# Patient Record
Sex: Female | Born: 1945 | Race: Black or African American | Hispanic: No | Marital: Married | Smoking: Former smoker
Health system: Southern US, Community
[De-identification: ages and names within clinical notes are randomized; demographics above are authoritative.]

## PROBLEM LIST (undated history)

## (undated) DIAGNOSIS — I509 Heart failure, unspecified: Secondary | ICD-10-CM

## (undated) DIAGNOSIS — K219 Gastro-esophageal reflux disease without esophagitis: Secondary | ICD-10-CM

## (undated) DIAGNOSIS — J189 Pneumonia, unspecified organism: Secondary | ICD-10-CM

## (undated) DIAGNOSIS — Z8489 Family history of other specified conditions: Secondary | ICD-10-CM

## (undated) DIAGNOSIS — E7800 Pure hypercholesterolemia, unspecified: Secondary | ICD-10-CM

## (undated) DIAGNOSIS — M1990 Unspecified osteoarthritis, unspecified site: Secondary | ICD-10-CM

## (undated) DIAGNOSIS — R112 Nausea with vomiting, unspecified: Secondary | ICD-10-CM

## (undated) DIAGNOSIS — J40 Bronchitis, not specified as acute or chronic: Secondary | ICD-10-CM

## (undated) DIAGNOSIS — D649 Anemia, unspecified: Secondary | ICD-10-CM

## (undated) DIAGNOSIS — E079 Disorder of thyroid, unspecified: Secondary | ICD-10-CM

## (undated) DIAGNOSIS — Z9889 Other specified postprocedural states: Secondary | ICD-10-CM

## (undated) DIAGNOSIS — E039 Hypothyroidism, unspecified: Secondary | ICD-10-CM

## (undated) DIAGNOSIS — I10 Essential (primary) hypertension: Secondary | ICD-10-CM

## (undated) HISTORY — PX: BREAST SURGERY: SHX581

## (undated) HISTORY — PX: OTHER SURGICAL HISTORY: SHX169

## (undated) HISTORY — PX: ABDOMINAL HYSTERECTOMY: SHX81

## (undated) HISTORY — PX: KNEE ARTHROPLASTY: SHX992

## (undated) HISTORY — PX: TONSILLECTOMY: SUR1361

## (undated) HISTORY — DX: Essential (primary) hypertension: I10

## (undated) HISTORY — PX: FOOT SURGERY: SHX648

## (undated) HISTORY — DX: Disorder of thyroid, unspecified: E07.9

## (undated) HISTORY — PX: COLONOSCOPY W/ POLYPECTOMY: SHX1380

---

## 1998-08-25 ENCOUNTER — Encounter: Admission: RE | Admit: 1998-08-25 | Discharge: 1998-11-23 | Payer: Self-pay | Admitting: *Deleted

## 1998-10-11 ENCOUNTER — Other Ambulatory Visit: Admission: RE | Admit: 1998-10-11 | Discharge: 1998-10-11 | Payer: Self-pay | Admitting: Obstetrics and Gynecology

## 2000-07-22 ENCOUNTER — Encounter: Payer: Self-pay | Admitting: Orthopedic Surgery

## 2000-07-22 ENCOUNTER — Ambulatory Visit (HOSPITAL_COMMUNITY): Admission: RE | Admit: 2000-07-22 | Discharge: 2000-07-22 | Payer: Self-pay | Admitting: Orthopedic Surgery

## 2000-10-23 ENCOUNTER — Other Ambulatory Visit: Admission: RE | Admit: 2000-10-23 | Discharge: 2000-10-23 | Payer: Self-pay | Admitting: Obstetrics and Gynecology

## 2001-11-11 ENCOUNTER — Other Ambulatory Visit: Admission: RE | Admit: 2001-11-11 | Discharge: 2001-11-11 | Payer: Self-pay | Admitting: Obstetrics and Gynecology

## 2002-11-30 ENCOUNTER — Encounter: Admission: RE | Admit: 2002-11-30 | Discharge: 2002-11-30 | Payer: Self-pay | Admitting: Internal Medicine

## 2002-11-30 ENCOUNTER — Encounter: Payer: Self-pay | Admitting: Internal Medicine

## 2002-12-02 ENCOUNTER — Other Ambulatory Visit: Admission: RE | Admit: 2002-12-02 | Discharge: 2002-12-02 | Payer: Self-pay | Admitting: Obstetrics and Gynecology

## 2003-12-09 ENCOUNTER — Other Ambulatory Visit: Admission: RE | Admit: 2003-12-09 | Discharge: 2003-12-09 | Payer: Self-pay | Admitting: Obstetrics and Gynecology

## 2004-01-31 ENCOUNTER — Ambulatory Visit: Payer: Self-pay | Admitting: Internal Medicine

## 2004-02-20 ENCOUNTER — Ambulatory Visit: Payer: Self-pay | Admitting: Internal Medicine

## 2004-03-27 ENCOUNTER — Ambulatory Visit: Payer: Self-pay | Admitting: Family Medicine

## 2004-05-01 ENCOUNTER — Ambulatory Visit: Payer: Self-pay | Admitting: Internal Medicine

## 2004-05-08 ENCOUNTER — Ambulatory Visit: Payer: Self-pay | Admitting: Internal Medicine

## 2004-05-30 ENCOUNTER — Ambulatory Visit: Payer: Self-pay | Admitting: Internal Medicine

## 2004-06-28 ENCOUNTER — Ambulatory Visit: Payer: Self-pay | Admitting: Internal Medicine

## 2004-07-05 ENCOUNTER — Ambulatory Visit: Payer: Self-pay | Admitting: Internal Medicine

## 2004-07-31 ENCOUNTER — Ambulatory Visit: Payer: Self-pay | Admitting: Internal Medicine

## 2004-08-20 ENCOUNTER — Ambulatory Visit: Payer: Self-pay | Admitting: Family Medicine

## 2004-10-08 ENCOUNTER — Ambulatory Visit: Payer: Self-pay | Admitting: Internal Medicine

## 2004-10-25 ENCOUNTER — Ambulatory Visit: Payer: Self-pay | Admitting: Internal Medicine

## 2004-11-19 ENCOUNTER — Ambulatory Visit: Payer: Self-pay | Admitting: Internal Medicine

## 2005-01-02 ENCOUNTER — Other Ambulatory Visit: Admission: RE | Admit: 2005-01-02 | Discharge: 2005-01-02 | Payer: Self-pay | Admitting: Obstetrics and Gynecology

## 2005-01-21 ENCOUNTER — Ambulatory Visit: Payer: Self-pay | Admitting: Internal Medicine

## 2005-04-15 ENCOUNTER — Ambulatory Visit: Payer: Self-pay | Admitting: Internal Medicine

## 2005-04-23 ENCOUNTER — Ambulatory Visit: Payer: Self-pay | Admitting: Internal Medicine

## 2005-07-15 ENCOUNTER — Ambulatory Visit: Payer: Self-pay | Admitting: Internal Medicine

## 2005-07-22 ENCOUNTER — Ambulatory Visit: Payer: Self-pay | Admitting: Internal Medicine

## 2005-09-09 ENCOUNTER — Ambulatory Visit: Payer: Self-pay | Admitting: Internal Medicine

## 2005-09-11 ENCOUNTER — Encounter: Admission: RE | Admit: 2005-09-11 | Discharge: 2005-09-11 | Payer: Self-pay | Admitting: Internal Medicine

## 2005-11-13 ENCOUNTER — Ambulatory Visit: Payer: Self-pay | Admitting: Internal Medicine

## 2005-11-26 ENCOUNTER — Ambulatory Visit: Payer: Self-pay | Admitting: Internal Medicine

## 2006-02-18 ENCOUNTER — Ambulatory Visit: Payer: Self-pay | Admitting: Internal Medicine

## 2006-02-25 ENCOUNTER — Ambulatory Visit: Payer: Self-pay | Admitting: Internal Medicine

## 2006-03-09 ENCOUNTER — Encounter: Admission: RE | Admit: 2006-03-09 | Discharge: 2006-03-09 | Payer: Self-pay | Admitting: Orthopedic Surgery

## 2006-03-20 ENCOUNTER — Ambulatory Visit (HOSPITAL_BASED_OUTPATIENT_CLINIC_OR_DEPARTMENT_OTHER): Admission: RE | Admit: 2006-03-20 | Discharge: 2006-03-20 | Payer: Self-pay | Admitting: Orthopedic Surgery

## 2006-04-28 ENCOUNTER — Ambulatory Visit: Payer: Self-pay | Admitting: Internal Medicine

## 2006-04-28 LAB — CONVERTED CEMR LAB
BUN: 7 mg/dL (ref 6–23)
Hgb A1c MFr Bld: 8.3 % — ABNORMAL HIGH (ref 4.6–6.0)
TSH: 0.9 microintl units/mL (ref 0.35–5.50)

## 2006-05-15 ENCOUNTER — Ambulatory Visit: Payer: Self-pay | Admitting: Internal Medicine

## 2006-07-04 ENCOUNTER — Ambulatory Visit: Payer: Self-pay | Admitting: Internal Medicine

## 2006-07-16 ENCOUNTER — Ambulatory Visit: Payer: Self-pay | Admitting: Internal Medicine

## 2006-08-05 ENCOUNTER — Ambulatory Visit: Payer: Self-pay | Admitting: Internal Medicine

## 2006-08-05 LAB — CONVERTED CEMR LAB
ALT: 30 units/L (ref 0–40)
AST: 21 units/L (ref 0–37)
Albumin: 3.4 g/dL — ABNORMAL LOW (ref 3.5–5.2)
Alkaline Phosphatase: 66 units/L (ref 39–117)
BUN: 12 mg/dL (ref 6–23)
Calcium: 9.8 mg/dL (ref 8.4–10.5)
Chloride: 106 meq/L (ref 96–112)
Direct LDL: 140.5 mg/dL
GFR calc non Af Amer: 109 mL/min
Hgb A1c MFr Bld: 8 % — ABNORMAL HIGH (ref 4.6–6.0)
Total CHOL/HDL Ratio: 3.3
VLDL: 20 mg/dL (ref 0–40)

## 2006-09-24 DIAGNOSIS — I10 Essential (primary) hypertension: Secondary | ICD-10-CM | POA: Insufficient documentation

## 2006-09-24 DIAGNOSIS — E039 Hypothyroidism, unspecified: Secondary | ICD-10-CM | POA: Insufficient documentation

## 2006-11-14 ENCOUNTER — Ambulatory Visit: Payer: Self-pay | Admitting: Internal Medicine

## 2006-11-14 DIAGNOSIS — E118 Type 2 diabetes mellitus with unspecified complications: Secondary | ICD-10-CM | POA: Insufficient documentation

## 2006-11-14 LAB — CONVERTED CEMR LAB
BUN: 7 mg/dL (ref 6–23)
Calcium: 9.5 mg/dL (ref 8.4–10.5)
GFR calc Af Amer: 162 mL/min
GFR calc non Af Amer: 134 mL/min
Microalb Creat Ratio: 788.4 mg/g — ABNORMAL HIGH (ref 0.0–30.0)
Microalb, Ur: 80.5 mg/dL — ABNORMAL HIGH (ref 0.0–1.9)
Potassium: 3.4 meq/L — ABNORMAL LOW (ref 3.5–5.1)

## 2006-12-03 ENCOUNTER — Telehealth: Payer: Self-pay | Admitting: Internal Medicine

## 2006-12-04 ENCOUNTER — Ambulatory Visit: Payer: Self-pay | Admitting: Gastroenterology

## 2007-01-08 ENCOUNTER — Encounter: Payer: Self-pay | Admitting: Internal Medicine

## 2007-01-19 ENCOUNTER — Encounter: Payer: Self-pay | Admitting: Internal Medicine

## 2007-04-09 ENCOUNTER — Ambulatory Visit: Payer: Self-pay | Admitting: Internal Medicine

## 2007-04-09 LAB — CONVERTED CEMR LAB
CO2: 28 meq/L (ref 19–32)
Calcium: 9.2 mg/dL (ref 8.4–10.5)
Creatinine, Ser: 0.7 mg/dL (ref 0.4–1.2)
GFR calc Af Amer: 110 mL/min
Glucose, Bld: 239 mg/dL — ABNORMAL HIGH (ref 70–99)
Microalb Creat Ratio: 710.3 mg/g — ABNORMAL HIGH (ref 0.0–30.0)
Potassium: 4.1 meq/L (ref 3.5–5.1)

## 2007-04-30 ENCOUNTER — Telehealth: Payer: Self-pay | Admitting: Internal Medicine

## 2007-06-26 ENCOUNTER — Ambulatory Visit: Payer: Self-pay | Admitting: Internal Medicine

## 2007-06-26 LAB — CONVERTED CEMR LAB
BUN: 9 mg/dL (ref 6–23)
Chloride: 102 meq/L (ref 96–112)
GFR calc Af Amer: 162 mL/min
GFR calc non Af Amer: 134 mL/min
Hgb A1c MFr Bld: 8.3 % — ABNORMAL HIGH (ref 4.6–6.0)
Potassium: 3.6 meq/L (ref 3.5–5.1)
Sodium: 142 meq/L (ref 135–145)
TSH: 0.06 microintl units/mL — ABNORMAL LOW (ref 0.35–5.50)

## 2007-09-29 ENCOUNTER — Ambulatory Visit: Payer: Self-pay | Admitting: Internal Medicine

## 2007-10-09 ENCOUNTER — Encounter: Admission: RE | Admit: 2007-10-09 | Discharge: 2007-10-09 | Payer: Self-pay | Admitting: Obstetrics and Gynecology

## 2007-10-09 ENCOUNTER — Encounter: Payer: Self-pay | Admitting: Internal Medicine

## 2007-11-27 ENCOUNTER — Ambulatory Visit: Payer: Self-pay | Admitting: Internal Medicine

## 2007-11-27 LAB — CONVERTED CEMR LAB
Creatinine, Urine: 32.8 mg/dL
Free T4: 1.36 ng/dL (ref 0.89–1.80)
Microalb, Ur: 18.1 mg/dL — ABNORMAL HIGH (ref 0.00–1.89)

## 2007-12-09 ENCOUNTER — Telehealth: Payer: Self-pay | Admitting: Internal Medicine

## 2008-02-16 ENCOUNTER — Ambulatory Visit: Payer: Self-pay | Admitting: Internal Medicine

## 2008-02-16 DIAGNOSIS — K3184 Gastroparesis: Secondary | ICD-10-CM | POA: Insufficient documentation

## 2008-02-16 LAB — CONVERTED CEMR LAB
BUN: 9 mg/dL (ref 6–23)
CO2: 33 meq/L — ABNORMAL HIGH (ref 19–32)
Chloride: 102 meq/L (ref 96–112)
Glucose, Bld: 199 mg/dL — ABNORMAL HIGH (ref 70–99)
Hgb A1c MFr Bld: 8.6 % — ABNORMAL HIGH (ref 4.6–6.0)
Microalb Creat Ratio: 333.3 mg/g — ABNORMAL HIGH (ref 0.0–30.0)
Microalb, Ur: 16.3 mg/dL — ABNORMAL HIGH (ref 0.0–1.9)
Potassium: 3.8 meq/L (ref 3.5–5.1)
Sodium: 142 meq/L (ref 135–145)
TSH: 1.12 microintl units/mL (ref 0.35–5.50)

## 2008-03-01 LAB — HM DIABETES EYE EXAM

## 2008-05-18 ENCOUNTER — Ambulatory Visit: Payer: Self-pay | Admitting: Internal Medicine

## 2008-06-29 ENCOUNTER — Ambulatory Visit: Payer: Self-pay | Admitting: Internal Medicine

## 2008-08-31 ENCOUNTER — Ambulatory Visit: Payer: Self-pay | Admitting: Internal Medicine

## 2008-08-31 LAB — CONVERTED CEMR LAB
Calcium: 9.3 mg/dL (ref 8.4–10.5)
GFR calc non Af Amer: 130.7 mL/min (ref >60)
Potassium: 3 meq/L — ABNORMAL LOW (ref 3.5–5.1)
Sodium: 143 meq/L (ref 135–145)
TSH: 0.23 microintl units/mL — ABNORMAL LOW (ref 0.35–5.50)

## 2008-09-27 ENCOUNTER — Encounter: Payer: Self-pay | Admitting: Internal Medicine

## 2009-01-11 ENCOUNTER — Telehealth: Payer: Self-pay | Admitting: Internal Medicine

## 2009-01-12 ENCOUNTER — Encounter: Payer: Self-pay | Admitting: Internal Medicine

## 2009-01-12 ENCOUNTER — Ambulatory Visit: Payer: Self-pay | Admitting: Family Medicine

## 2009-01-12 DIAGNOSIS — Z862 Personal history of diseases of the blood and blood-forming organs and certain disorders involving the immune mechanism: Secondary | ICD-10-CM | POA: Insufficient documentation

## 2009-01-12 DIAGNOSIS — M791 Myalgia, unspecified site: Secondary | ICD-10-CM | POA: Insufficient documentation

## 2009-01-12 DIAGNOSIS — Z8639 Personal history of other endocrine, nutritional and metabolic disease: Secondary | ICD-10-CM

## 2009-01-16 LAB — CONVERTED CEMR LAB
Basophils Relative: 0.8 % (ref 0.0–3.0)
CO2: 30 meq/L (ref 19–32)
Calcium: 9.7 mg/dL (ref 8.4–10.5)
Creatinine, Ser: 0.6 mg/dL (ref 0.4–1.2)
GFR calc non Af Amer: 130.54 mL/min (ref >60)
Hemoglobin: 12.8 g/dL (ref 12.0–15.0)
Lymphocytes Relative: 38.8 % (ref 12.0–46.0)
Lymphs Abs: 4.6 10*3/uL — ABNORMAL HIGH (ref 0.7–4.0)
MCHC: 33.2 g/dL (ref 30.0–36.0)
Monocytes Absolute: 1.1 10*3/uL — ABNORMAL HIGH (ref 0.1–1.0)
Monocytes Relative: 8.9 % (ref 3.0–12.0)
Neutrophils Relative %: 48.9 % (ref 43.0–77.0)
RBC: 4.6 M/uL (ref 3.87–5.11)
RDW: 13.3 % (ref 11.5–14.6)
Sodium: 139 meq/L (ref 135–145)
Total CK: 140 units/L (ref 7–177)
Vit D, 25-Hydroxy: 22 ng/mL — ABNORMAL LOW (ref 30–89)

## 2009-03-14 ENCOUNTER — Ambulatory Visit: Payer: Self-pay | Admitting: Internal Medicine

## 2009-03-14 DIAGNOSIS — M810 Age-related osteoporosis without current pathological fracture: Secondary | ICD-10-CM | POA: Insufficient documentation

## 2009-03-14 DIAGNOSIS — M8580 Other specified disorders of bone density and structure, unspecified site: Secondary | ICD-10-CM | POA: Insufficient documentation

## 2009-03-14 LAB — CONVERTED CEMR LAB
BUN: 10 mg/dL (ref 6–23)
CO2: 30 meq/L (ref 19–32)
Chloride: 100 meq/L (ref 96–112)
Creatinine, Ser: 0.6 mg/dL (ref 0.4–1.2)
Glucose, Bld: 111 mg/dL — ABNORMAL HIGH (ref 70–99)
TSH: 0.41 microintl units/mL (ref 0.35–5.50)

## 2009-04-06 ENCOUNTER — Encounter: Payer: Self-pay | Admitting: Internal Medicine

## 2009-04-12 ENCOUNTER — Encounter: Admission: RE | Admit: 2009-04-12 | Discharge: 2009-04-12 | Payer: Self-pay | Admitting: Emergency Medicine

## 2009-06-15 ENCOUNTER — Ambulatory Visit (HOSPITAL_COMMUNITY): Admission: RE | Admit: 2009-06-15 | Discharge: 2009-06-16 | Payer: Self-pay | Admitting: Obstetrics and Gynecology

## 2009-07-28 ENCOUNTER — Ambulatory Visit: Payer: Self-pay | Admitting: Internal Medicine

## 2009-07-28 DIAGNOSIS — E1169 Type 2 diabetes mellitus with other specified complication: Secondary | ICD-10-CM | POA: Insufficient documentation

## 2009-07-28 DIAGNOSIS — E785 Hyperlipidemia, unspecified: Secondary | ICD-10-CM

## 2009-07-28 LAB — CONVERTED CEMR LAB
Calcium: 9.8 mg/dL (ref 8.4–10.5)
Cholesterol: 214 mg/dL — ABNORMAL HIGH (ref 0–200)
Creatinine, Ser: 0.6 mg/dL (ref 0.4–1.2)
Direct LDL: 142.4 mg/dL
HDL goal, serum: 40 mg/dL
HDL: 56.2 mg/dL (ref >39.00)
Hgb A1c MFr Bld: 8.4 % — ABNORMAL HIGH (ref 4.6–6.5)

## 2009-08-17 LAB — HM DIABETES FOOT EXAM

## 2009-09-01 ENCOUNTER — Ambulatory Visit: Payer: Self-pay | Admitting: Internal Medicine

## 2009-09-01 ENCOUNTER — Telehealth: Payer: Self-pay | Admitting: Internal Medicine

## 2009-09-21 ENCOUNTER — Telehealth: Payer: Self-pay | Admitting: Internal Medicine

## 2009-10-17 ENCOUNTER — Ambulatory Visit: Payer: Self-pay | Admitting: Internal Medicine

## 2009-10-17 DIAGNOSIS — W57XXXA Bitten or stung by nonvenomous insect and other nonvenomous arthropods, initial encounter: Secondary | ICD-10-CM | POA: Insufficient documentation

## 2009-10-17 DIAGNOSIS — S90569A Insect bite (nonvenomous), unspecified ankle, initial encounter: Secondary | ICD-10-CM

## 2009-12-11 ENCOUNTER — Ambulatory Visit: Payer: Self-pay | Admitting: Internal Medicine

## 2009-12-11 LAB — CONVERTED CEMR LAB
ALT: 25 units/L (ref 0–35)
Albumin: 3.7 g/dL (ref 3.5–5.2)
Alkaline Phosphatase: 72 units/L (ref 39–117)
CO2: 33 meq/L — ABNORMAL HIGH (ref 19–32)
Calcium: 9.5 mg/dL (ref 8.4–10.5)
Chloride: 101 meq/L (ref 96–112)
Cholesterol: 220 mg/dL — ABNORMAL HIGH (ref 0–200)
Creatinine, Ser: 0.6 mg/dL (ref 0.4–1.2)
Direct LDL: 140.3 mg/dL
Glucose, Bld: 180 mg/dL — ABNORMAL HIGH (ref 70–99)
Total Protein: 6.5 g/dL (ref 6.0–8.3)
Triglycerides: 116 mg/dL (ref 0.0–149.0)

## 2009-12-18 ENCOUNTER — Ambulatory Visit: Payer: Self-pay | Admitting: Internal Medicine

## 2009-12-18 ENCOUNTER — Telehealth: Payer: Self-pay | Admitting: Internal Medicine

## 2010-01-08 ENCOUNTER — Telehealth: Payer: Self-pay | Admitting: Internal Medicine

## 2010-01-17 ENCOUNTER — Telehealth: Payer: Self-pay | Admitting: Internal Medicine

## 2010-02-05 ENCOUNTER — Ambulatory Visit: Payer: Self-pay | Admitting: Internal Medicine

## 2010-02-05 LAB — CONVERTED CEMR LAB: Hgb A1c MFr Bld: 9.7 % — ABNORMAL HIGH (ref 4.6–6.5)

## 2010-02-15 ENCOUNTER — Ambulatory Visit: Payer: Self-pay | Admitting: Internal Medicine

## 2010-03-20 ENCOUNTER — Ambulatory Visit: Payer: Self-pay | Admitting: Internal Medicine

## 2010-03-20 LAB — CONVERTED CEMR LAB
BUN: 10 mg/dL (ref 6–23)
CO2: 30 meq/L (ref 19–32)
Calcium: 9.7 mg/dL (ref 8.4–10.5)
Creatinine, Ser: 0.7 mg/dL (ref 0.4–1.2)

## 2010-05-01 NOTE — Assessment & Plan Note (Signed)
Summary: rov/mm   Vital Signs:  Patient profile:   65 year old female Height:      65 inches Weight:      230 pounds BMI:     38.41 Temp:     97.9 degrees F oral Pulse rate:   76 / minute Resp:     14 per minute BP sitting:   140 / 80  (left arm) Cuff size:   large  Vitals Entered By: Willy Eddy, LPN (August 31, 1608 1:42 PM)  CC:  roa-not taking amitiza--too expensive and Type 2 diabetes mellitus follow-up.  History of Present Illness: Follow up chronic medical problems  the CBG's have been running in the 140 range  Type 2 Diabetes Mellitus Follow-Up      This is a 65 year old woman who presents for Type 2 diabetes mellitus follow-up.  The patient denies polyuria, polydipsia, blurred vision, self managed hypoglycemia, hypoglycemia requiring help, weight loss, weight gain, and numbness of extremities.  The patient denies the following symptoms: neuropathic pain, chest pain, vomiting, orthostatic symptoms, poor wound healing, intermittent claudication, vision loss, and foot ulcer.  Since the last visit the patient reports good dietary compliance.  The patient has been measuring capillary blood glucose before breakfast.  Since the last visit, the patient reports having had no eye care.  Complications from diabetes include renal insufficiency.    Diabetes Management History:      The patient is a 65 years old female who comes in for evaluation of DM Type 2.  She has not been enrolled in the "Diabetic Education Program".  She is checking home blood sugars.  She says that she is exercising.    Hypertension History:      She denies headache, chest pain, palpitations, dyspnea with exertion, orthopnea, PND, peripheral edema, visual symptoms, neurologic problems, syncope, and side effects from treatment.        Positive major cardiovascular risk factors include female age 8 years old or older, diabetes, and hypertension.  Negative major cardiovascular risk factors include non-tobacco-user  status.     Allergies (verified): 1)  ! Erythromycin 2)  ! Pcn  Past History:  Family History: Last updated: 11/14/2006 Family History of CAD Female 1st degree relative <50 Family History Diabetes 1st degree relative Family History Hypertension  Social History: Last updated: 11/14/2006 Former Smoker Occupation: Married  Risk Factors: Exercise: yes (06/26/2007)  Risk Factors: Smoking Status: quit (11/14/2006)  Past medical, surgical, family and social histories (including risk factors) reviewed, and no changes noted (except as noted below).  Past Medical History: Reviewed history from 11/14/2006 and no changes required. Hypertension Hypothyroidism Diabetes Diabetes mellitus, type II  Past Surgical History: Reviewed history from 11/14/2006 and no changes required. knee surgery arthroscopy Hysterectomy Carpal tunnel release foot surgery  Family History: Reviewed history from 11/14/2006 and no changes required. Family History of CAD Female 1st degree relative <50 Family History Diabetes 1st degree relative Family History Hypertension  Social History: Reviewed history from 11/14/2006 and no changes required. Former Smoker Occupation: Married  Physical Exam  General:  alert and overweight-appearing.   Head:  normocephalic and atraumatic.   Ears:  R ear normal and L ear normal.   Nose:  mucosal edema, airflow obstruction, R frontal sinus tenderness, and R maxillary sinus tenderness.   Mouth:  posterior lymphoid hypertrophy and postnasal drip.   Neck:  No deformities, masses, or tenderness noted. Lungs:  normal respiratory effort.   Heart:  normal rate and  no murmur.   Abdomen:  soft and no distention.   Msk:  no joint tenderness, no joint swelling, and no joint warmth.  normal ROM.    Diabetes Management Exam:    Foot Exam (with socks and/or shoes not present):       Sensory-Pinprick/Light touch:          Left medial foot (L-4): normal          Left dorsal  foot (L-5): normal          Left lateral foot (S-1): normal          Right medial foot (L-4): normal          Right dorsal foot (L-5): normal          Right lateral foot (S-1): normal       Sensory-Monofilament:          Left foot: normal          Right foot: normal       Inspection:          Left foot: normal          Right foot: normal       Nails:          Left foot: normal          Right foot: normal    Eye Exam:       Eye Exam done elsewhere          Date: 03/01/2008          Results: normal          Done by: fox   Impression & Recommendations:  Problem # 1:  DIABETES MELLITUS, TYPE II (ICD-250.00)  Her updated medication list for this problem includes:    Lotrel 10-40 Mg Caps (Amlodipine besy-benazepril hcl) .Marland Kitchen... Take 1 tablet by mouth once a day    Glyburide-metformin 5-500 Mg Tabs (Glyburide-metformin) .Marland Kitchen..Marland Kitchen Two times a day    Byetta 5 Mcg Pen 5 Mcg/0.74ml Soln (Exenatide) ..... One shot two times a day  Labs Reviewed: Creat: 0.9 (02/16/2008)     Last Eye Exam: normal (03/01/2008) Reviewed HgBA1c results: 8.6 (02/16/2008)  9.5 (11/27/2007)  Orders: Venipuncture (16109) TLB-BMP (Basic Metabolic Panel-BMET) (80048-METABOL) TLB-A1C / Hgb A1C (Glycohemoglobin) (83036-A1C)  Problem # 2:  HYPOTHYROIDISM (ICD-244.9)  Her updated medication list for this problem includes:    Synthroid 150 Mcg Tabs (Levothyroxine sodium) ..... Once daily  Orders: TLB-TSH (Thyroid Stimulating Hormone) (84443-TSH)  Labs Reviewed: TSH: 1.12 (02/16/2008)    HgBA1c: 8.6 (02/16/2008) Chol: 233 (08/05/2006)   HDL: 71.6 (08/05/2006)   LDL: DEL (08/05/2006)   TG: 99 (08/05/2006)  Problem # 3:  HYPERTENSION (ICD-401.9) Assessment: Improved  Her updated medication list for this problem includes:    Lasix 40 Mg Tabs (Furosemide) .Marland Kitchen... 2 two times a day    Lotrel 10-40 Mg Caps (Amlodipine besy-benazepril hcl) .Marland Kitchen... Take 1 tablet by mouth once a day  BP today: 140/80 Prior BP: 140/80  (06/29/2008)  Prior 10 Yr Risk Heart Disease: Not enough information (06/29/2008)  Labs Reviewed: K+: 3.8 (02/16/2008) Creat: : 0.9 (02/16/2008)   Chol: 233 (08/05/2006)   HDL: 71.6 (08/05/2006)   LDL: DEL (08/05/2006)   TG: 99 (08/05/2006)  Complete Medication List: 1)  Synthroid 150 Mcg Tabs (Levothyroxine sodium) .... Once daily 2)  Vitamin B-12 Tabs (Cyanocobalamin tabs) .... Take 1 tablet by mouth once a day 3)  Lasix 40 Mg Tabs (Furosemide) .... 2 two times a  day 4)  Glucosamine-msm Caps (Glucosamine sulfate-msm caps) .... Once daily 5)  Lotrel 10-40 Mg Caps (Amlodipine besy-benazepril hcl) .... Take 1 tablet by mouth once a day 6)  Vytorin 10-20 Mg Tabs (Ezetimibe-simvastatin) .... 1/2 tab once daily. 7)  Zyrtec Tabs (Cetirizine hcl tabs) .... As needed 8)  Glyburide-metformin 5-500 Mg Tabs (Glyburide-metformin) .... Two times a day 9)  Calcium 600 1500 Mg Tabs (Calcium carbonate) .... Two times a day 10)  Freestyle Lite Test Strp (Glucose blood) .... Use two times a day to test blood glucose 11)  Eql Fish Oil 1000 Mg Caps (Omega-3 fatty acids) .Marland Kitchen.. 1 once daily 12)  Ergocalciferol 50000 Unit Caps (Ergocalciferol) .Marland Kitchen.. 1 every week 13)  Byetta 5 Mcg Pen 5 Mcg/0.110ml Soln (Exenatide) .... One shot two times a day  Diabetes Management Assessment/Plan:      Her blood pressure goal is < 130/80.    Hypertension Assessment/Plan:      The patient's hypertensive risk group is category C: Target organ damage and/or diabetes.  Today's blood pressure is 140/80.  Her blood pressure goal is < 130/80.  Patient Instructions: 1)  CPX in dec

## 2010-05-01 NOTE — Assessment & Plan Note (Signed)
Summary: PINKEYE CHEST CONGESTION   Vital Signs:  Patient profile:   65 year old female Height:      65 inches Weight:      226 pounds BMI:     37.74 Temp:     98.7 degrees F oral Pulse rate:   80 / minute Resp:     14 per minute BP sitting:   140 / 76  (left arm) Cuff size:   large  Vitals Entered By: Willy Eddy, LPN (September 01, 1608 11:15 AM) CC: c/o chest congestion and left eye drainage, red and irritated, Hypertension Management   CC:  c/o chest congestion and left eye drainage, red and irritated, and Hypertension Management.  History of Present Illness: has HTN but did not take meds this AM has been exposed to pink eye and has inflamed itching eye has cough productive of yellow sputum and has a sore chest from coughing  htn stable  Hypertension History:      She denies headache, chest pain, palpitations, dyspnea with exertion, orthopnea, PND, peripheral edema, visual symptoms, neurologic problems, syncope, and side effects from treatment.  did not take meds this AM due to fasting for labs.        Positive major cardiovascular risk factors include female age 64 years old or older, diabetes, hyperlipidemia, and hypertension.  Negative major cardiovascular risk factors include non-tobacco-user status.        Further assessment for target organ damage reveals no history of ASHD, stroke/TIA, or peripheral vascular disease.     Preventive Screening-Counseling & Management  Alcohol-Tobacco     Smoking Status: quit  Current Problems (verified): 1)  Hyperlipidemia  (ICD-272.4) 2)  Osteopenia  (ICD-733.90) 3)  Hypokalemia, Hx of  (ICD-V12.2) 4)  Myalgia  (ICD-729.1) 5)  Gastroparesis  (ICD-536.3) 6)  Health Maintenance Exam  (ICD-V70.0) 7)  Diabetes Mellitus, Type II  (ICD-250.00) 8)  Family History Diabetes 1st Degree Relative  (ICD-V18.0) 9)  Family History of Cad Female 1st Degree Relative <50  (ICD-V17.3) 10)  Hypothyroidism  (ICD-244.9) 11)  Hypertension   (ICD-401.9)  Current Medications (verified): 1)  Synthroid 150 Mcg  Tabs (Levothyroxine Sodium) .... Once Daily 2)  Vitamin B-12   Tabs (Cyanocobalamin Tabs) .... Take 1 Tablet By Mouth Once A Day 3)  Lasix 40 Mg  Tabs (Furosemide) .... 2 Two Times A Day 4)  Glucosamine-Msm   Caps (Glucosamine Sulfate-Msm Caps) .... Once Daily 5)  Lotrel 10-40 Mg  Caps (Amlodipine Besy-Benazepril Hcl) .... Take 1 Tablet By Mouth Once A Day 6)  Vytorin 10-20 Mg  Tabs (Ezetimibe-Simvastatin) .... 1/2 Tab Once Daily. 7)  Zyrtec   Tabs (Cetirizine Hcl Tabs) .... As Needed 8)  Glyburide-Metformin 5-500 Mg  Tabs (Glyburide-Metformin) .... Two Times A Day 9)  Calcium 600 1500 Mg  Tabs (Calcium Carbonate) .... Two Times A Day 10)  Freestyle Lite Test   Strp (Glucose Blood) .... Use Two Times A Day To Test Blood Glucose 11)  Eql Fish Oil 1000 Mg Caps (Omega-3 Fatty Acids) .Marland Kitchen.. 1 Once Daily 12)  Ergocalciferol 50000 Unit Caps (Ergocalciferol) .Marland Kitchen.. 1 Every Week 13)  Byetta 5 Mcg Pen 5 Mcg/0.67ml Soln (Exenatide) .... One Shot Two Times A Day 14)  Tramadol Hcl 50 Mg Tabs (Tramadol Hcl) .... One Tab Every 6 Hours As Needed For Pain 15)  Vitamin D (Ergocalciferol) 50000 Unit Caps (Ergocalciferol) .Marland Kitchen.. 1 Every Week For 3 Months  Allergies (verified): 1)  ! Erythromycin 2)  ! Pcn  Past History:  Family History: Last updated: 11/14/2006 Family History of CAD Female 1st degree relative <50 Family History Diabetes 1st degree relative Family History Hypertension  Social History: Last updated: 11/14/2006 Former Smoker Occupation: Married  Risk Factors: Exercise: yes (06/26/2007)  Risk Factors: Smoking Status: quit (09/01/2009)  Past medical, surgical, family and social histories (including risk factors) reviewed, and no changes noted (except as noted below).  Past Medical History: Reviewed history from 11/14/2006 and no changes required. Hypertension Hypothyroidism Diabetes Diabetes mellitus, type  II  Past Surgical History: Reviewed history from 11/14/2006 and no changes required. knee surgery arthroscopy Hysterectomy Carpal tunnel release foot surgery  Family History: Reviewed history from 11/14/2006 and no changes required. Family History of CAD Female 1st degree relative <50 Family History Diabetes 1st degree relative Family History Hypertension  Social History: Reviewed history from 11/14/2006 and no changes required. Former Smoker Occupation: Married  Review of Systems       The patient complains of hoarseness and prolonged cough.  The patient denies anorexia, fever, weight loss, weight gain, vision loss, decreased hearing, chest pain, syncope, dyspnea on exertion, peripheral edema, headaches, hemoptysis, abdominal pain, melena, hematochezia, severe indigestion/heartburn, hematuria, incontinence, genital sores, muscle weakness, suspicious skin lesions, transient blindness, difficulty walking, depression, unusual weight change, abnormal bleeding, enlarged lymph nodes, angioedema, and breast masses.    Physical Exam  General:  Well-developed,well-nourished,in no acute distress; alert,appropriate and cooperative throughout examination Head:  normocephalic and atraumatic.   Eyes:  conjunctival injection and excessive tearing.   Ears:  R ear normal and L ear normal.   Nose:  no external deformity and no nasal discharge.   Lungs:  Normal respiratory effort, chest expands symmetrically. Lungs are clear to auscultation, no crackles or wheezes. Heart:  regular rateregular rhythm.   Abdomen:  soft, non-tender, and normal bowel sounds.     Impression & Recommendations:  Problem # 1:  OTHER AND UNSPECIFIED CONJUNCTIVITIS (ICD-372.39) Assessment New  rs coticosporin opt drops called in  Discussed treatment, and urged patient to wash hands carefully after touching face.   Problem # 2:  ACUTE BRONCHITIS (ICD-466.0) Assessment: New factive samples for 5days Take antibiotics  and other medications as directed. Encouraged to push clear liquids, get enough rest, and take acetaminophen as needed. To be seen in 5-7 days if no improvement, sooner if worse.  Her updated medication list for this problem includes:    Bpm Pe Dm 05-06-08 Mg/32ml Syrp (Phenylephrine-bromphen-dm) .Marland Kitchen..Marland Kitchen Two tsp by mouth  q 6 hours prn  Problem # 3:  HYPERTENSION (ICD-401.9) Assessment: Deteriorated  Her updated medication list for this problem includes:    Lasix 40 Mg Tabs (Furosemide) .Marland Kitchen... 2 two times a day    Lotrel 10-40 Mg Caps (Amlodipine besy-benazepril hcl) .Marland Kitchen... Take 1 tablet by mouth once a day  Complete Medication List: 1)  Synthroid 150 Mcg Tabs (Levothyroxine sodium) .... Once daily 2)  Vitamin B-12 Tabs (Cyanocobalamin tabs) .... Take 1 tablet by mouth once a day 3)  Lasix 40 Mg Tabs (Furosemide) .... 2 two times a day 4)  Glucosamine-msm Caps (Glucosamine sulfate-msm caps) .... Once daily 5)  Lotrel 10-40 Mg Caps (Amlodipine besy-benazepril hcl) .... Take 1 tablet by mouth once a day 6)  Vytorin 10-20 Mg Tabs (Ezetimibe-simvastatin) .... 1/2 tab once daily. 7)  Zyrtec Tabs (Cetirizine hcl tabs) .... As needed 8)  Glyburide-metformin 5-500 Mg Tabs (Glyburide-metformin) .... Two times a day 9)  Calcium 600 1500 Mg Tabs (Calcium carbonate) .... Two times  a day 10)  Freestyle Lite Test Strp (Glucose blood) .... Use two times a day to test blood glucose 11)  Eql Fish Oil 1000 Mg Caps (Omega-3 fatty acids) .Marland Kitchen.. 1 once daily 12)  Ergocalciferol 50000 Unit Caps (Ergocalciferol) .Marland Kitchen.. 1 every week 13)  Byetta 5 Mcg Pen 5 Mcg/0.100ml Soln (Exenatide) .... One shot two times a day 14)  Tramadol Hcl 50 Mg Tabs (Tramadol hcl) .... One tab every 6 hours as needed for pain 15)  Vitamin D (ergocalciferol) 50000 Unit Caps (Ergocalciferol) .Marland Kitchen.. 1 every week for 3 months 16)  Neomycin-polymyxin-hc 3.5-10000-1 Susp (Neomycin-polymyxin-hc) .... 4 drops in effected eye 4 times a day 17)  Bpm Pe Dm  05-06-08 Mg/70ml Syrp (Phenylephrine-bromphen-dm) .... Two tsp by mouth  q 6 hours prn  Hypertension Assessment/Plan:      The patient's hypertensive risk group is category C: Target organ damage and/or diabetes.  Today's blood pressure is 140/76.  Her blood pressure goal is < 130/80.  Patient Instructions: 1)  Take your antibiotic as prescribed until ALL of it is gone, but stop if you develop a rash or swelling and contact our office as soon as possible. Prescriptions: BPM PE DM 05-06-08 MG/5ML SYRP (PHENYLEPHRINE-BROMPHEN-DM) two tsp by mouth  q 6 hours prn  #6 oz x 0   Entered and Authorized by:   Stacie Glaze MD   Signed by:   Stacie Glaze MD on 09/01/2009   Method used:   Electronically to        [facility] [phone]* (retail)       [address] [place]  [address]       Ph: [phone] or [phone]       Fax: [phone]   RxID:   [id number] NEOMYCIN-POLYMYXIN-HC 3.5-10000-1 [facility]) 4 drops in effected eye 4 times a day  #5cc x 1   Entered and Authorized by:   Stacie Glaze MD   Signed by:   Stacie Glaze MD on 09/01/2009   Method used:   [facility]ronically to        [facility] [phone]* (retail)       [address]       [place], [place]  [address]       Ph: [phone] or [phone]       Fax: [phone]   RxID:   [id number]

## 2010-05-01 NOTE — Progress Notes (Signed)
Summary: advise of pens and levels  Phone Note Call from Patient Call back at Home Phone [phone] Call back at [phone]   Caller: Patient---voice mail Reason for Call: Talk to Nurse Summary of Call: just about out of insulin pens. was told to turn the pen to 20. not helping her glucose levels. please advise. Initial call taken by: Warnell Forester,  January 17, 2010 10:50 AM  Follow-up for Phone Call        pt called back - sugars running 210 , 214 , 148 , 197 , 177 please call and advise Follow-up by: Duard Brady LPN,  January 17, 2010 3:00 PM  Additional Follow-up for Phone Call Additional follow up Details #1::        may give additon sample lantus pin and increase to 30 Additional Follow-up by: Stacie Glaze MD,  January 17, 2010 4:21 PM    Additional Follow-up for Phone Call Additional follow up Details #2::    spoke with pt - informed of increase of lantus to 30 units , 2 samples ready for pick up - in IM side refidge. KIK Follow-up by: Duard Brady LPN,  January 17, 2010 4:33 PM

## 2010-05-01 NOTE — Assessment & Plan Note (Signed)
Summary: 3 month-fup//ccm   Vital Signs:  Patient Profile:   65 Years Old Female Height:     65 inches Weight:      234 pounds Temp:     97.5 degrees F oral Pulse rate:   80 / minute Resp:     14 per minute BP sitting:   140 / 80  (left arm) Cuff size:   large  Vitals Entered By: Willy Eddy, LPN (May 18, 2008 1:37 PM)               Vision Comments: 03/2009   Chief Complaint:  roa and Type 2 diabetes mellitus follow-up.  History of Present Illness: the sugars have been high and the pt stopped the sweet tea and the reading remain high in the 200 range  Type 2 Diabetes Mellitus Follow-Up      This is a 65 year old woman who presents for Type 2 diabetes mellitus follow-up.  The patient reports weight loss and numbness of extremities, but denies polyuria, polydipsia, blurred vision, self managed hypoglycemia, hypoglycemia requiring help, and weight gain.  The patient denies the following symptoms: neuropathic pain, chest pain, vomiting, orthostatic symptoms, poor wound healing, intermittent claudication, vision loss, and foot ulcer.  Since the last visit the patient reports poor dietary compliance.  The patient has been measuring capillary blood glucose before breakfast.  Since the last visit, the patient reports having had eye care by a retinal specialist.  Complications from diabetes include renal insufficiency.    Diabetes Management History:      The patient is a 65 years old female who comes in for evaluation of DM Type 2.  She has not been enrolled in the "Diabetic Education Program".  She is checking home blood sugars.  She says that she is exercising.       Prior Medication List:  SYNTHROID 150 MCG  TABS (LEVOTHYROXINE SODIUM) once daily VITAMIN B-12   TABS (CYANOCOBALAMIN TABS) Take 1 tablet by mouth once a day LASIX 40 MG  TABS (FUROSEMIDE) 2 two times a day GLUCOSAMINE-MSM   CAPS (GLUCOSAMINE SULFATE-MSM CAPS) once daily LOTREL 10-40 MG  CAPS (AMLODIPINE  BESY-BENAZEPRIL HCL) Take 1 tablet by mouth once a day VYTORIN 10-20 MG  TABS (EZETIMIBE-SIMVASTATIN) 1/2 tab once daily. ZYRTEC   TABS (CETIRIZINE HCL TABS) as needed GLYBURIDE-METFORMIN 5-500 MG  TABS (GLYBURIDE-METFORMIN) two times a day CALCIUM 600 1500 MG  TABS (CALCIUM CARBONATE) two times a day FREESTYLE LITE TEST   STRP (GLUCOSE BLOOD) use two times a day to test blood glucose EQL FISH OIL 1000 MG CAPS (OMEGA-3 FATTY ACIDS) 1 once daily AMITIZA 24 MCG CAPS (LUBIPROSTONE) one by mouth daily   Current Allergies (reviewed today): ! ERYTHROMYCIN ! PCN  Past Medical History:    Reviewed history from 11/14/2006 and no changes required:       Hypertension       Hypothyroidism       Diabetes       Diabetes mellitus, type II  Past Surgical History:    Reviewed history from 11/14/2006 and no changes required:       knee surgery arthroscopy       Hysterectomy       Carpal tunnel release       foot surgery   Family History:    Reviewed history from 11/14/2006 and no changes required:       Family History of CAD Female 1st degree relative <50  Family History Diabetes 1st degree relative       Family History Hypertension  Social History:    Reviewed history from 11/14/2006 and no changes required:       Former Smoker       Occupation:       Married   Risk Factors: Tobacco use:  quit Exercise:  yes   Review of Systems  The patient denies anorexia, fever, weight loss, weight gain, vision loss, decreased hearing, hoarseness, chest pain, syncope, dyspnea on exertion, peripheral edema, prolonged cough, headaches, hemoptysis, abdominal pain, melena, hematochezia, severe indigestion/heartburn, hematuria, incontinence, genital sores, muscle weakness, suspicious skin lesions, transient blindness, difficulty walking, depression, unusual weight change, abnormal bleeding, enlarged lymph nodes, angioedema, and breast masses.     Physical Exam  General:     alert and  overweight-appearing.   Head:     normocephalic and atraumatic.   Ears:     R ear normal and L ear normal.   Nose:     mucosal edema, airflow obstruction, R frontal sinus tenderness, and R maxillary sinus tenderness.   Mouth:     posterior lymphoid hypertrophy and postnasal drip.   Neck:     No deformities, masses, or tenderness noted. Lungs:     normal respiratory effort.   Heart:     normal rate and no murmur.   Abdomen:     soft and no distention.   Msk:     no joint tenderness, no joint swelling, and no joint warmth.  normal ROM.   Extremities:     trace left pedal edema and trace right pedal edema.   Neurologic:     alert & oriented X3 and cranial nerves II-XII intact.    Diabetes Management Exam:    Foot Exam (with socks and/or shoes not present):       Sensory-Pinprick/Light touch:          Left medial foot (L-4): diminished          Left dorsal foot (L-5): diminished          Left lateral foot (S-1): diminished          Right medial foot (L-4): diminished          Right dorsal foot (L-5): diminished          Right lateral foot (S-1): diminished    Eye Exam:       Eye Exam done elsewhere          Date: 03/01/2008          Results: normal    Impression & Recommendations:  Problem # 1:  DIABETES MELLITUS, TYPE II (ICD-250.00) byetta face to face teaching I have spent greater that 30 min face to face evaluating this patient  Her updated medication list for this problem includes:    Lotrel 10-40 Mg Caps (Amlodipine besy-benazepril hcl) .Marland Kitchen... Take 1 tablet by mouth once a day    Glyburide-metformin 5-500 Mg Tabs (Glyburide-metformin) .Marland Kitchen..Marland Kitchen Two times a day    Byetta 5 Mcg Pen 5 Mcg/0.30ml Soln (Exenatide) ..... One shot two times a day  Labs Reviewed: HgBA1c: 8.6 (02/16/2008)   Creat: 0.9 (02/16/2008)   Microalbumin: 16.3 (02/16/2008)  Last Eye Exam: normal (03/01/2008)   Problem # 2:  HYPOTHYROIDISM (ICD-244.9)  Her updated medication list for this problem  includes:    Synthroid 150 Mcg Tabs (Levothyroxine sodium) ..... Once daily  Labs Reviewed: TSH: 1.12 (02/16/2008)    HgBA1c: 8.6 (02/16/2008) Chol: 233 (  08/05/2006)   HDL: 71.6 (08/05/2006)   LDL: 140.5 (08/05/2006)   TG: 99 (08/05/2006)   Problem # 3:  HYPERTENSION (ICD-401.9)  Her updated medication list for this problem includes:    Lasix 40 Mg Tabs (Furosemide) .Marland Kitchen... 2 two times a day    Lotrel 10-40 Mg Caps (Amlodipine besy-benazepril hcl) .Marland Kitchen... Take 1 tablet by mouth once a day  BP today: 140/80 Prior BP: 130/80 (02/16/2008)  Prior 10 Yr Risk Heart Disease: 13 % (02/16/2008)  Labs Reviewed: Creat: 0.9 (02/16/2008) Chol: 233 (08/05/2006)   HDL: 71.6 (08/05/2006)   LDL: 140.5 (08/05/2006)   TG: 99 (08/05/2006)   Complete Medication List: 1)  Synthroid 150 Mcg Tabs (Levothyroxine sodium) .... Once daily 2)  Vitamin B-12 Tabs (Cyanocobalamin tabs) .... Take 1 tablet by mouth once a day 3)  Lasix 40 Mg Tabs (Furosemide) .... 2 two times a day 4)  Glucosamine-msm Caps (Glucosamine sulfate-msm caps) .... Once daily 5)  Lotrel 10-40 Mg Caps (Amlodipine besy-benazepril hcl) .... Take 1 tablet by mouth once a day 6)  Vytorin 10-20 Mg Tabs (Ezetimibe-simvastatin) .... 1/2 tab once daily. 7)  Zyrtec Tabs (Cetirizine hcl tabs) .... As needed 8)  Glyburide-metformin 5-500 Mg Tabs (Glyburide-metformin) .... Two times a day 9)  Calcium 600 1500 Mg Tabs (Calcium carbonate) .... Two times a day 10)  Freestyle Lite Test Strp (Glucose blood) .... Use two times a day to test blood glucose 11)  Eql Fish Oil 1000 Mg Caps (Omega-3 fatty acids) .Marland Kitchen.. 1 once daily 12)  Amitiza 24 Mcg Caps (Lubiprostone) .... One by mouth daily 13)  Ergocalciferol 50000 Unit Caps (Ergocalciferol) .Marland Kitchen.. 1 every week 14)  Byetta 5 Mcg Pen 5 Mcg/0.32ml Soln (Exenatide) .... One shot two times a day  Diabetes Management Assessment/Plan:      The following lipid goals have been established for the patient: Total  cholesterol goal of 200; LDL cholesterol goal of 100; HDL cholesterol goal of 40; Triglyceride goal of 200.  Her blood pressure goal is < 130/80.     Patient Instructions: 1)  Please schedule a follow-up appointment in 6 weeks.

## 2010-05-01 NOTE — Assessment & Plan Note (Signed)
Summary: 3 MNTH ROV   Vital Signs:  Patient profile:   65 year old female Height:      65 inches Weight:      228 pounds BMI:     38.08 Temp:     98.2 degrees F oral Pulse rate:   76 / minute Pulse rhythm:   regular Resp:     14 per minute BP sitting:   140 / 80  (left arm)  Vitals Entered By: Willy Eddy, LPN (October 17, 2009 10:22 AM) CC: roa- possible unknown insect bite on left lower leg that is red and inflammed, Type 2 diabetes mellitus follow-up, Hypertension Management   Primary Care Provider:  Stacie Glaze MD  CC:  roa- possible unknown insect bite on left lower leg that is red and inflammed, Type 2 diabetes mellitus follow-up, and Hypertension Management.  History of Present Illness: blood pressure good but has not lost weight blood sugar have been in the 190 range  Type 2 Diabetes Mellitus Follow-Up      This is a 65 year old woman who presents for Type 2 diabetes mellitus follow-up.  has een missing pills.  The patient reports weight gain, but denies polyuria, polydipsia, blurred vision, self managed hypoglycemia, hypoglycemia requiring help, weight loss, and numbness of extremities.  The patient denies the following symptoms: neuropathic pain, chest pain, vomiting, orthostatic symptoms, poor wound healing, intermittent claudication, vision loss, and foot ulcer.  Since the last visit the patient reports poor dietary compliance and noncompliance with medications.  The patient has been measuring capillary blood glucose before breakfast.  Since the last visit, the patient reports having had eye care by an ophthalmologist.    Hypertension History:      She denies headache, chest pain, palpitations, dyspnea with exertion, orthopnea, PND, peripheral edema, visual symptoms, neurologic problems, syncope, and side effects from treatment.        Positive major cardiovascular risk factors include female age 23 years old or older, diabetes, hyperlipidemia, and hypertension.   Negative major cardiovascular risk factors include non-tobacco-user status.        Further assessment for target organ damage reveals no history of ASHD, stroke/TIA, or peripheral vascular disease.     Preventive Screening-Counseling & Management  Alcohol-Tobacco     Smoking Status: quit  Problems Prior to Update: 1)  Acute Bronchitis  (ICD-466.0) 2)  Hyperlipidemia  (ICD-272.4) 3)  Osteopenia  (ICD-733.90) 4)  Hypokalemia, Hx of  (ICD-V12.2) 5)  Myalgia  (ICD-729.1) 6)  Gastroparesis  (ICD-536.3) 7)  Health Maintenance Exam  (ICD-V70.0) 8)  Diabetes Mellitus, Type II  (ICD-250.00) 9)  Family History Diabetes 1st Degree Relative  (ICD-V18.0) 10)  Family History of Cad Female 1st Degree Relative <50  (ICD-V17.3) 11)  Hypothyroidism  (ICD-244.9) 12)  Hypertension  (ICD-401.9)  Current Problems (verified): 1)  Acute Bronchitis  (ICD-466.0) 2)  Hyperlipidemia  (ICD-272.4) 3)  Osteopenia  (ICD-733.90) 4)  Hypokalemia, Hx of  (ICD-V12.2) 5)  Myalgia  (ICD-729.1) 6)  Gastroparesis  (ICD-536.3) 7)  Health Maintenance Exam  (ICD-V70.0) 8)  Diabetes Mellitus, Type II  (ICD-250.00) 9)  Family History Diabetes 1st Degree Relative  (ICD-V18.0) 10)  Family History of Cad Female 1st Degree Relative <50  (ICD-V17.3) 11)  Hypothyroidism  (ICD-244.9) 12)  Hypertension  (ICD-401.9)  Medications Prior to Update: 1)  Synthroid 150 Mcg  Tabs (Levothyroxine Sodium) .... Once Daily 2)  Vitamin B-12   Tabs (Cyanocobalamin Tabs) .... Take 1 Tablet By Mouth Once  A Day 3)  Lasix 40 Mg  Tabs (Furosemide) .... 2 Two Times A Day 4)  Glucosamine-Msm   Caps (Glucosamine Sulfate-Msm Caps) .... Once Daily 5)  Lotrel 10-40 Mg  Caps (Amlodipine Besy-Benazepril Hcl) .... Take 1 Tablet By Mouth Once A Day 6)  Vytorin 10-20 Mg  Tabs (Ezetimibe-Simvastatin) .... 1/2 Tab Once Daily. 7)  Zyrtec   Tabs (Cetirizine Hcl Tabs) .... As Needed 8)  Glyburide-Metformin 5-500 Mg  Tabs (Glyburide-Metformin) .... Two Times A  Day 9)  Calcium 600 1500 Mg  Tabs (Calcium Carbonate) .... Two Times A Day 10)  Freestyle Lite Test   Strp (Glucose Blood) .... Use Two Times A Day To Test Blood Glucose 11)  Eql Fish Oil 1000 Mg Caps (Omega-3 Fatty Acids) .Marland Kitchen.. 1 Once Daily 12)  Ergocalciferol 50000 Unit Caps (Ergocalciferol) .Marland Kitchen.. 1 Every Week 13)  Byetta 5 Mcg Pen 5 Mcg/0.66ml Soln (Exenatide) .... One Shot Two Times A Day 14)  Tramadol Hcl 50 Mg Tabs (Tramadol Hcl) .... One Tab Every 6 Hours As Needed For Pain 15)  Vitamin D (Ergocalciferol) 50000 Unit Caps (Ergocalciferol) .Marland Kitchen.. 1 Every Week For 3 Months 16)  Neomycin-Polymyxin-Hc 3.5-10000-1 Susp (Neomycin-Polymyxin-Hc) .... 4 Drops in Effected Eye 4 Times A Day 17)  Bpm Pe Dm 05-06-08 Mg/50ml Syrp (Phenylephrine-Bromphen-Dm) .... Two Tsp By Mouth  Q 6 Hours Prn 18)  Tramadol Hcl 50 Mg  Tabs (Tramadol Hcl) .... Take 1 Tablet By Mouth Two Times A Day As Needed Pain  Current Medications (verified): 1)  Synthroid 150 Mcg  Tabs (Levothyroxine Sodium) .... Once Daily 2)  Vitamin B-12   Tabs (Cyanocobalamin Tabs) .... Take 1 Tablet By Mouth Once A Day 3)  Lasix 40 Mg  Tabs (Furosemide) .... 2 Two Times A Day 4)  Glucosamine-Msm   Caps (Glucosamine Sulfate-Msm Caps) .... Once Daily 5)  Lotrel 10-40 Mg  Caps (Amlodipine Besy-Benazepril Hcl) .... Take 1 Tablet By Mouth Once A Day 6)  Vytorin 10-20 Mg  Tabs (Ezetimibe-Simvastatin) .... 1/2 Tab Once Daily. 7)  Zyrtec   Tabs (Cetirizine Hcl Tabs) .... As Needed 8)  Glyburide-Metformin 5-500 Mg  Tabs (Glyburide-Metformin) .... Two Times A Day 9)  Calcium 600 1500 Mg  Tabs (Calcium Carbonate) .... Two Times A Day 10)  Freestyle Lite Test   Strp (Glucose Blood) .... Use Two Times A Day To Test Blood Glucose 11)  Eql Fish Oil 1000 Mg Caps (Omega-3 Fatty Acids) .Marland Kitchen.. 1 Once Daily 12)  Ergocalciferol 50000 Unit Caps (Ergocalciferol) .Marland Kitchen.. 1 Every Week 13)  Byetta 5 Mcg Pen 5 Mcg/0.18ml Soln (Exenatide) .... One Shot Two Times A Day 14)   Tramadol Hcl 50 Mg Tabs (Tramadol Hcl) .... One Tab Every 6 Hours As Needed For Pain 15)  Vitamin D (Ergocalciferol) 50000 Unit Caps (Ergocalciferol) .Marland Kitchen.. 1 Every Week For 3 Months 16)  Neomycin-Polymyxin-Hc 3.5-10000-1 Susp (Neomycin-Polymyxin-Hc) .... 4 Drops in Effected Eye 4 Times A Day 17)  Bpm Pe Dm 05-06-08 Mg/26ml Syrp (Phenylephrine-Bromphen-Dm) .... Two Tsp By Mouth  Q 6 Hours Prn 18)  Tramadol Hcl 50 Mg  Tabs (Tramadol Hcl) .... Take 1 Tablet By Mouth Two Times A Day As Needed Pain 19)  Clotrimazole-Betamethasone 1-0.05 % Crea (Clotrimazole-Betamethasone) .... Applu To Lesion Two Times A Day  Allergies (verified): 1)  ! Erythromycin 2)  ! Pcn  Past History:  Family History: Last updated: 11/14/2006 Family History of CAD Female 1st degree relative <50 Family History Diabetes 1st degree relative Family History Hypertension  Social History: Last updated: 11/14/2006 Former Smoker Occupation: Married  Risk Factors: Exercise: yes (06/26/2007)  Risk Factors: Smoking Status: quit (10/17/2009)  Past medical, surgical, family and social histories (including risk factors) reviewed, and no changes noted (except as noted below).  Past Medical History: Reviewed history from 11/14/2006 and no changes required. Hypertension Hypothyroidism Diabetes Diabetes mellitus, type II  Past Surgical History: Reviewed history from 11/14/2006 and no changes required. knee surgery arthroscopy Hysterectomy Carpal tunnel release foot surgery  Family History: Reviewed history from 11/14/2006 and no changes required. Family History of CAD Female 1st degree relative <50 Family History Diabetes 1st degree relative Family History Hypertension  Social History: Reviewed history from 11/14/2006 and no changes required. Former Smoker Occupation: Married  Review of Systems  The patient denies anorexia, fever, weight loss, weight gain, vision loss, decreased hearing, hoarseness, chest pain,  syncope, dyspnea on exertion, peripheral edema, prolonged cough, headaches, hemoptysis, abdominal pain, melena, hematochezia, severe indigestion/heartburn, hematuria, incontinence, genital sores, muscle weakness, suspicious skin lesions, transient blindness, difficulty walking, depression, unusual weight change, abnormal bleeding, enlarged lymph nodes, angioedema, and breast masses.    Physical Exam  General:  Well-developed,well-nourished,in no acute distress; alert,appropriate and cooperative throughout examination Head:  normocephalic and atraumatic.   Ears:  R ear normal and L ear normal.   Nose:  no external deformity and no nasal discharge.   Mouth:  good dentition and pharynx pink and moist.   Lungs:  Normal respiratory effort, chest expands symmetrically. Lungs are clear to auscultation, no crackles or wheezes. Heart:  regular rateregular rhythm.   Msk:  normal ROM and no joint deformities.   Extremities:  trace left pedal edema and trace right pedal edema.   Neurologic:  alert & oriented X3 and gait normal.     Impression & Recommendations:  Problem # 1:  DIABETES MELLITUS, TYPE II (ICD-250.00) Assessment Deteriorated forgets the second metformin, eats late and does not takt the second pill!!! Her updated medication list for this problem includes:    Lotrel 10-40 Mg Caps (Amlodipine besy-benazepril hcl) .Marland Kitchen... Take 1 tablet by mouth once a day    Glyburide-metformin 5-500 Mg Tabs (Glyburide-metformin) .Marland Kitchen..Marland Kitchen Two times a day    Byetta 5 Mcg Pen 5 Mcg/0.30ml Soln (Exenatide) ..... One shot two times a day  Labs Reviewed: Creat: 0.6 (07/28/2009)     Last Eye Exam: normal (03/01/2008) Reviewed HgBA1c results: 8.4 (07/28/2009)  7.6 (08/31/2008)  Problem # 2:  INSECT BITE, LEG (ICD-916.4) Assessment: Unchanged  Problem # 3:  HYPERTENSION (ICD-401.9) Assessment: Unchanged  Her updated medication list for this problem includes:    Lasix 40 Mg Tabs (Furosemide) .Marland Kitchen... 2 two times  a day    Lotrel 10-40 Mg Caps (Amlodipine besy-benazepril hcl) .Marland Kitchen... Take 1 tablet by mouth once a day  BP today: 140/80 Prior BP: 140/76 (09/01/2009)  Prior 10 Yr Risk Heart Disease: Not enough information (06/29/2008)  Labs Reviewed: K+: 3.2 (07/28/2009) Creat: : 0.6 (07/28/2009)   Chol: 214 (07/28/2009)   HDL: 56.20 (07/28/2009)   LDL: DEL (08/05/2006)   TG: 99 (08/05/2006)  Problem # 4:  HYPERLIPIDEMIA (ICD-272.4) Assessment: Unchanged has been missing dose Her updated medication list for this problem includes:    Vytorin 10-20 Mg Tabs (Ezetimibe-simvastatin) .Marland Kitchen... 1/2 tab once daily.  Labs Reviewed: SGOT: 21 (08/05/2006)   SGPT: 30 (08/05/2006)  Lipid Goals: Chol Goal: 200 (07/28/2009)   HDL Goal: 40 (07/28/2009)   LDL Goal: 100 (07/28/2009)   TG Goal: 150 (07/28/2009)  Prior  10 Yr Risk Heart Disease: Not enough information (06/29/2008)   HDL:56.20 (07/28/2009), 71.6 (08/05/2006)  LDL:DEL (08/05/2006)  Chol:214 (07/28/2009), 233 (08/05/2006)  Trig:99 (08/05/2006)  Complete Medication List: 1)  Synthroid 150 Mcg Tabs (Levothyroxine sodium) .... Once daily 2)  Vitamin B-12 Tabs (Cyanocobalamin tabs) .... Take 1 tablet by mouth once a day 3)  Lasix 40 Mg Tabs (Furosemide) .... 2 two times a day 4)  Glucosamine-msm Caps (Glucosamine sulfate-msm caps) .... Once daily 5)  Lotrel 10-40 Mg Caps (Amlodipine besy-benazepril hcl) .... Take 1 tablet by mouth once a day 6)  Vytorin 10-20 Mg Tabs (Ezetimibe-simvastatin) .... 1/2 tab once daily. 7)  Zyrtec Tabs (Cetirizine hcl tabs) .... As needed 8)  Glyburide-metformin 5-500 Mg Tabs (Glyburide-metformin) .... Two times a day 9)  Calcium 600 1500 Mg Tabs (Calcium carbonate) .... Two times a day 10)  Freestyle Lite Test Strp (Glucose blood) .... Use two times a day to test blood glucose 11)  Eql Fish Oil 1000 Mg Caps (Omega-3 fatty acids) .Marland Kitchen.. 1 once daily 12)  Ergocalciferol 50000 Unit Caps (Ergocalciferol) .Marland Kitchen.. 1 every week 13)   Byetta 5 Mcg Pen 5 Mcg/0.54ml Soln (Exenatide) .... One shot two times a day 14)  Tramadol Hcl 50 Mg Tabs (Tramadol hcl) .... One tab every 6 hours as needed for pain 15)  Vitamin D (ergocalciferol) 50000 Unit Caps (Ergocalciferol) .Marland Kitchen.. 1 every week for 3 months 16)  Neomycin-polymyxin-hc 3.5-10000-1 Susp (Neomycin-polymyxin-hc) .... 4 drops in effected eye 4 times a day 17)  Bpm Pe Dm 05-06-08 Mg/44ml Syrp (Phenylephrine-bromphen-dm) .... Two tsp by mouth  q 6 hours prn 18)  Tramadol Hcl 50 Mg Tabs (Tramadol hcl) .... Take 1 tablet by mouth two times a day as needed pain 19)  Clotrimazole-betamethasone 1-0.05 % Crea (Clotrimazole-betamethasone) .... Applu to lesion two times a day  Hypertension Assessment/Plan:      The patient's hypertensive risk group is category C: Target organ damage and/or diabetes.  Today's blood pressure is 140/80.  Her blood pressure goal is < 130/80.  Patient Instructions: 1)  set a schedule for medications so that you do not continue to doses 2)  Please schedule a follow-up appointment in 2 months. 3)  BMP prior to visit, ICD-9:401.9 4)  Hepatic Panel prior to visit, ICD-9:995.20 5)  Lipid Panel prior to visit, ICD-9:272.4 6)  HbgA1C prior to visit, ICD-9:250.00 Prescriptions: TRAMADOL HCL 50 MG TABS (TRAMADOL HCL) one tab every 6 hours as needed for pain  #60 x 0   Entered and Authorized by:   Stacie Glaze MD   Signed by:   Stacie Glaze MD on 10/17/2009   Method used:   Electronically to        [facility] [phone]* (retail)       [address], [place]  [address]       Ph: [phone] or [phone]       Fax: [phone]   RxID:   [id number] CLOTRIMAZOLE-BETAMETHASONE 1-0.05 % CREA (CLOTRIMAZOLE-BETAMETHASONE) applu to lesion two times a day  #30mg  x 1   Entered and Authorized by:   Stacie Glaze MD   Signed by:   Stacie Glaze MD on 10/17/2009   Method used:   Electronically to        [facility]. [phone]* (retail)        [address]       [place], [place]  [address]       Ph: [phone] or [phone]       Fax: [phone]   RxID:   [id number]

## 2010-05-01 NOTE — Progress Notes (Signed)
Summary: insulin ? & BS?  Phone Note Call from Patient Call back at Home Phone [phone]   Caller: vm Summary of Call: Started me on insulin pen.  Am I supposed to take it at a certain time every night or do I take it in the evening?  Is it supposed to bring my BS down in the morning? Initial call taken by: Rudy Jew, RN,  January 08, 2010 10:16 AM  Follow-up for Phone Call        do not eat anything after supper Follow-up by: Willy Eddy, LPN,  January 08, 2010 10:20 AM

## 2010-05-01 NOTE — Assessment & Plan Note (Signed)
Summary: 2 MONTH FOLLOW UP/CJR   Vital Signs:  Patient profile:   65 year old female Height:      65 inches Weight:      230 pounds BMI:     38.41 Temp:     98.2 degrees F oral Pulse rate:   76 / minute Resp:     14 per minute BP sitting:   134 / 76  (left arm)  Vitals Entered By: Willy Eddy, LPN (December 18, 2009 11:48 AM) CC: roa labs-pt states she usually only take blyburide-metformin once a day and only take byetta once a day, Hypertension Management Is Patient Diabetic? Yes Did you bring your meter with you today? No   Primary Care Provider:  Stacie Glaze MD  CC:  roa labs-pt states she usually only take blyburide-metformin once a day and only take byetta once a day and Hypertension Management.  History of Present Illness: the pts Dm is out of control with an A1C of 10 with fasting glucose of 180 she has not been following a diet she admits to stress eating  BP is stable but has increased  Hypertension History:      She denies headache, chest pain, palpitations, dyspnea with exertion, orthopnea, PND, peripheral edema, visual symptoms, neurologic problems, syncope, and side effects from treatment.        Positive major cardiovascular risk factors include female age 25 years old or older, diabetes, hyperlipidemia, and hypertension.  Negative major cardiovascular risk factors include non-tobacco-user status.        Further assessment for target organ damage reveals no history of ASHD, stroke/TIA, or peripheral vascular disease.      Preventive Screening-Counseling & Management  Alcohol-Tobacco     Smoking Status: quit     Tobacco Counseling: to remain off tobacco products  Problems Prior to Update: 1)  Insect Bite, Leg  (ICD-916.4) 2)  Acute Bronchitis  (ICD-466.0) 3)  Hyperlipidemia  (ICD-272.4) 4)  Osteopenia  (ICD-733.90) 5)  Hypokalemia, Hx of  (ICD-V12.2) 6)  Myalgia  (ICD-729.1) 7)  Gastroparesis  (ICD-536.3) 8)  Health Maintenance Exam   (ICD-V70.0) 9)  Diabetes Mellitus, Type II  (ICD-250.00) 10)  Family History Diabetes 1st Degree Relative  (ICD-V18.0) 11)  Family History of Cad Female 1st Degree Relative <50  (ICD-V17.3) 12)  Hypothyroidism  (ICD-244.9) 13)  Hypertension  (ICD-401.9)  Current Problems (verified): 1)  Insect Bite, Leg  (ICD-916.4) 2)  Acute Bronchitis  (ICD-466.0) 3)  Hyperlipidemia  (ICD-272.4) 4)  Osteopenia  (ICD-733.90) 5)  Hypokalemia, Hx of  (ICD-V12.2) 6)  Myalgia  (ICD-729.1) 7)  Gastroparesis  (ICD-536.3) 8)  Health Maintenance Exam  (ICD-V70.0) 9)  Diabetes Mellitus, Type II  (ICD-250.00) 10)  Family History Diabetes 1st Degree Relative  (ICD-V18.0) 11)  Family History of Cad Female 1st Degree Relative <50  (ICD-V17.3) 12)  Hypothyroidism  (ICD-244.9) 13)  Hypertension  (ICD-401.9)  Medications Prior to Update: 1)  Synthroid 150 Mcg  Tabs (Levothyroxine Sodium) .... Once Daily 2)  Vitamin B-12   Tabs (Cyanocobalamin Tabs) .... Take 1 Tablet By Mouth Once A Day 3)  Lasix 40 Mg  Tabs (Furosemide) .... 2 Two Times A Day 4)  Glucosamine-Msm   Caps (Glucosamine Sulfate-Msm Caps) .... Once Daily 5)  Lotrel 10-40 Mg  Caps (Amlodipine Besy-Benazepril Hcl) .... Take 1 Tablet By Mouth Once A Day 6)  Vytorin 10-20 Mg  Tabs (Ezetimibe-Simvastatin) .... 1/2 Tab Once Daily. 7)  Zyrtec   Tabs (Cetirizine Hcl Tabs) .Marland KitchenMarland KitchenMarland Kitchen  As Needed 8)  Glyburide-Metformin 5-500 Mg  Tabs (Glyburide-Metformin) .... Two Times A Day 9)  Calcium 600 1500 Mg  Tabs (Calcium Carbonate) .... Two Times A Day 10)  Freestyle Lite Test   Strp (Glucose Blood) .... Use Two Times A Day To Test Blood Glucose 11)  Eql Fish Oil 1000 Mg Caps (Omega-3 Fatty Acids) .Marland Kitchen.. 1 Once Daily 12)  Ergocalciferol 50000 Unit Caps (Ergocalciferol) .Marland Kitchen.. 1 Every Week 13)  Byetta 5 Mcg Pen 5 Mcg/0.54ml Soln (Exenatide) .... One Shot Two Times A Day 14)  Tramadol Hcl 50 Mg Tabs (Tramadol Hcl) .... One Tab Every 6 Hours As Needed For Pain 15)  Vitamin D  (Ergocalciferol) 50000 Unit Caps (Ergocalciferol) .Marland Kitchen.. 1 Every Week For 3 Months 16)  Neomycin-Polymyxin-Hc 3.5-10000-1 Susp (Neomycin-Polymyxin-Hc) .... 4 Drops in Effected Eye 4 Times A Day 17)  Bpm Pe Dm 05-06-08 Mg/87ml Syrp (Phenylephrine-Bromphen-Dm) .... Two Tsp By Mouth  Q 6 Hours Prn 18)  Tramadol Hcl 50 Mg  Tabs (Tramadol Hcl) .... Take 1 Tablet By Mouth Two Times A Day As Needed Pain 19)  Clotrimazole-Betamethasone 1-0.05 % Crea (Clotrimazole-Betamethasone) .... Applu To Lesion Two Times A Day  Current Medications (verified): 1)  Synthroid 150 Mcg  Tabs (Levothyroxine Sodium) .... Once Daily 2)  Vitamin B-12   Tabs (Cyanocobalamin Tabs) .... Take 1 Tablet By Mouth Once A Day 3)  Lasix 40 Mg  Tabs (Furosemide) .... 2 Two Times A Day 4)  Glucosamine-Msm   Caps (Glucosamine Sulfate-Msm Caps) .... Once Daily 5)  Lotrel 10-40 Mg  Caps (Amlodipine Besy-Benazepril Hcl) .... Take 1 Tablet By Mouth Once A Day 6)  Vytorin 10-20 Mg  Tabs (Ezetimibe-Simvastatin) .... 1/2 Tab Once Daily. 7)  Zyrtec   Tabs (Cetirizine Hcl Tabs) .... As Needed 8)  Kombiglyze Xr 07-998 Mg Xr24h-Tab (Saxagliptin-Metformin) .... One By Mouth Daily 9)  Calcium 600 1500 Mg  Tabs (Calcium Carbonate) .... Two Times A Day 10)  Freestyle Lite Test   Strp (Glucose Blood) .... Use Two Times A Day To Test Blood Glucose 11)  Eql Fish Oil 1000 Mg Caps (Omega-3 Fatty Acids) .Marland Kitchen.. 1 Once Daily 12)  Ergocalciferol 50000 Unit Caps (Ergocalciferol) .Marland Kitchen.. 1 Every Week 13)  Lantus Solostar 100 Unit/ml Soln (Insulin Glargine) .... 20 U Subcutaneously  At Bedtime 14)  Tramadol Hcl 50 Mg Tabs (Tramadol Hcl) .... One Tab Every 6 Hours As Needed For Pain 15)  Vitamin D (Ergocalciferol) 50000 Unit Caps (Ergocalciferol) .Marland Kitchen.. 1 Every Week For 3 Months 16)  Neomycin-Polymyxin-Hc 3.5-10000-1 Susp (Neomycin-Polymyxin-Hc) .... 4 Drops in Effected Eye 4 Times A Day 17)  Bpm Pe Dm 05-06-08 Mg/75ml Syrp (Phenylephrine-Bromphen-Dm) .... Two Tsp By Mouth  Q 6  Hours Prn 18)  Tramadol Hcl 50 Mg  Tabs (Tramadol Hcl) .... Take 1 Tablet By Mouth Two Times A Day As Needed Pain 19)  Clotrimazole-Betamethasone 1-0.05 % Crea (Clotrimazole-Betamethasone) .... Applu To Lesion Two Times A Day  Allergies (verified): 1)  ! Erythromycin 2)  ! Pcn  Past History:  Family History: Last updated: 11/14/2006 Family History of CAD Female 1st degree relative <50 Family History Diabetes 1st degree relative Family History Hypertension  Social History: Last updated: 11/14/2006 Former Smoker Occupation: Married  Risk Factors: Exercise: yes (06/26/2007)  Risk Factors: Smoking Status: quit (12/18/2009)  Past medical, surgical, family and social histories (including risk factors) reviewed, and no changes noted (except as noted below).  Past Medical History: Reviewed history from 11/14/2006 and no changes required. Hypertension Hypothyroidism  Diabetes Diabetes mellitus, type II  Past Surgical History: Reviewed history from 11/14/2006 and no changes required. knee surgery arthroscopy Hysterectomy Carpal tunnel release foot surgery  Family History: Reviewed history from 11/14/2006 and no changes required. Family History of CAD Female 1st degree relative <50 Family History Diabetes 1st degree relative Family History Hypertension  Social History: Reviewed history from 11/14/2006 and no changes required. Former Smoker Occupation: Married  Review of Systems       The patient complains of weight gain.  The patient denies anorexia, fever, weight loss, vision loss, hoarseness, chest pain, syncope, dyspnea on exertion, peripheral edema, prolonged cough, headaches, hemoptysis, abdominal pain, melena, hematochezia, severe indigestion/heartburn, hematuria, incontinence, genital sores, muscle weakness, suspicious skin lesions, transient blindness, difficulty walking, depression, unusual weight change, abnormal bleeding, enlarged lymph nodes, angioedema, and  breast masses.         Flu Vaccine Consent Questions     Do you have a history of severe allergic reactions to this vaccine? no    Any prior history of allergic reactions to egg and/or gelatin? no    Do you have a sensitivity to the preservative Thimersol? no    Do you have a past history of Guillan-Barre Syndrome? no    Do you currently have an acute febrile illness? no    Have you ever had a severe reaction to latex? no    Vaccine information given and explained to patient? yes    Are you currently pregnant? no    Lot Number:[id number]   Exp Date:09/29/2010   Site Given  Left Deltoid IM   Physical Exam  General:  Well-developed,well-nourished,in no acute distress; alert,appropriate and cooperative throughout examination Head:  normocephalic and atraumatic.   Eyes:  conjunctival injection and excessive tearing.   Ears:  R ear normal and L ear normal.   Nose:  no external deformity and no nasal discharge.   Mouth:  good dentition and pharynx pink and moist.   Lungs:  Normal respiratory effort, chest expands symmetrically. Lungs are clear to auscultation, no crackles or wheezes. Heart:  regular rateregular rhythm.   Abdomen:  soft and no masses.    Diabetes Management Exam:    Foot Exam (with socks and/or shoes not present):       Sensory-Pinprick/Light touch:          Left medial foot (L-4): normal          Left dorsal foot (L-5): normal          Left lateral foot (S-1): normal          Right medial foot (L-4): normal          Right dorsal foot (L-5): normal          Right lateral foot (S-1): normal       Sensory-Monofilament:          Left foot: normal          Right foot: normal       Inspection:          Left foot: normal          Right foot: normal       Nails:          Left foot: normal          Right foot: normal   Impression & Recommendations:  Problem # 1:  HYPERLIPIDEMIA (ICD-272.4)  Her updated medication list for this problem includes:    Vytorin 10-20  Mg Tabs (Ezetimibe-simvastatin) .Marland KitchenMarland KitchenMarland KitchenMarland Kitchen  1/2 tab once daily.  Labs Reviewed: SGOT: 20 (12/11/2009)   SGPT: 25 (12/11/2009)  Lipid Goals: Chol Goal: 200 (07/28/2009)   HDL Goal: 40 (07/28/2009)   LDL Goal: 100 (07/28/2009)   TG Goal: 150 (07/28/2009)  Prior 10 Yr Risk Heart Disease: Not enough information (06/29/2008)   HDL:62.60 (12/11/2009), 56.20 (07/28/2009)  LDL:DEL (08/05/2006)  Chol:220 (12/11/2009), 214 (07/28/2009)  Trig:116.0 (12/11/2009), 99 (08/05/2006)  Problem # 2:  DIABETES MELLITUS, TYPE II (ICD-250.00) Assessment: Deteriorated  Her updated medication list for this problem includes:    Lotrel 10-40 Mg Caps (Amlodipine besy-benazepril hcl) .Marland Kitchen... Take 1 tablet by mouth once a day    Kombiglyze Xr 07-998 Mg Xr24h-tab (Saxagliptin-metformin) ..... One by mouth daily    Lantus Solostar 100 Unit/ml Soln (Insulin glargine) .Marland Kitchen... 20 u subcutaneously  at bedtime  Labs Reviewed: Creat: 0.6 (12/11/2009)     Last Eye Exam: normal (03/01/2008) Reviewed HgBA1c results: 10.0 (12/11/2009)  8.4 (07/28/2009)  Complete Medication List: 1)  Synthroid 150 Mcg Tabs (Levothyroxine sodium) .... Once daily 2)  Vitamin B-12 Tabs (Cyanocobalamin tabs) .... Take 1 tablet by mouth once a day 3)  Lasix 40 Mg Tabs (Furosemide) .... 2 two times a day 4)  Glucosamine-msm Caps (Glucosamine sulfate-msm caps) .... Once daily 5)  Lotrel 10-40 Mg Caps (Amlodipine besy-benazepril hcl) .... Take 1 tablet by mouth once a day 6)  Vytorin 10-20 Mg Tabs (Ezetimibe-simvastatin) .... 1/2 tab once daily. 7)  Zyrtec Tabs (Cetirizine hcl tabs) .... As needed 8)  Kombiglyze Xr 07-998 Mg Xr24h-tab (Saxagliptin-metformin) .... One by mouth daily 9)  Calcium 600 1500 Mg Tabs (Calcium carbonate) .... Two times a day 10)  Freestyle Lite Test Strp (Glucose blood) .... Use two times a day to test blood glucose 11)  Eql Fish Oil 1000 Mg Caps (Omega-3 fatty acids) .Marland Kitchen.. 1 once daily 12)  Ergocalciferol 50000 Unit Caps  (Ergocalciferol) .Marland Kitchen.. 1 every week 13)  Lantus Solostar 100 Unit/ml Soln (Insulin glargine) .... 20 u subcutaneously  at bedtime 14)  Tramadol Hcl 50 Mg Tabs (Tramadol hcl) .... One tab every 6 hours as needed for pain 15)  Vitamin D (ergocalciferol) 50000 Unit Caps (Ergocalciferol) .Marland Kitchen.. 1 every week for 3 months 16)  Neomycin-polymyxin-hc 3.5-10000-1 Susp (Neomycin-polymyxin-hc) .... 4 drops in effected eye 4 times a day 17)  Bpm Pe Dm 05-06-08 Mg/26ml Syrp (Phenylephrine-bromphen-dm) .... Two tsp by mouth  q 6 hours prn 18)  Tramadol Hcl 50 Mg Tabs (Tramadol hcl) .... Take 1 tablet by mouth two times a day as needed pain 19)  Clotrimazole-betamethasone 1-0.05 % Crea (Clotrimazole-betamethasone) .... Applu to lesion two times a day  Other Orders: Admin 1st Vaccine ([address]) Flu Vaccine 66yrs + ([address])  Hypertension Assessment/Plan:      The patient's hypertensive risk group is category C: Target organ damage and/or diabetes.  Today's blood pressure is 134/76.  Her blood pressure goal is < 130/80.  Patient Instructions: 1)  Please schedule a follow-up appointment in 2 months. 2)  HbgA1C prior to visit, ICD-9:250.00 3)  take the kombiglyze in the morning and the shot of lantus 20 u at bedtime Prescriptions: KOMBIGLYZE XR 07-998 MG XR24H-TAB (SAXAGLIPTIN-METFORMIN) one by mouth daily  #30 x 11   Entered and Authorized by:   Stacie Glaze MD   Signed by:   Stacie Glaze MD on 12/18/2009   Method used:   Electronically to        [facility] [phone][facility] (retail)  [address]       [place], [place]  [address]       Ph: [phone] or [phone]       Fax: [phone]   RxID:   [id number]

## 2010-05-01 NOTE — Progress Notes (Signed)
Summary: Tramadol refill request  Phone Note Call from Patient   Caller: Patient Call For: Stacie Glaze MD Summary of Call: VM from pt c/o back pain again.  Pt requesting refill of Tramadol [facility] Initial call taken by: Sid Falcon LPN,  September 21, 2009 3:32 PM  Follow-up for Phone Call        see rx Follow-up by: Birdie Sons MD,  September 21, 2009 4:57 PM    New/Updated Medications: TRAMADOL HCL 50 MG  TABS (TRAMADOL HCL) Take 1 tablet by mouth two times a day as needed pain Prescriptions: TRAMADOL HCL 50 MG  TABS (TRAMADOL HCL) Take 1 tablet by mouth two times a day as needed pain  #10 x 0   Entered and Authorized by:   Birdie Sons MD   Signed by:   Birdie Sons MD on 09/21/2009   Method used:   [facility] to        [facility] [phone]* (retail)       [address]       [place], [place]  [address]       Ph: [phone] or [phone]       Fax: [phone]   RxID:   [id number]

## 2010-05-01 NOTE — Progress Notes (Signed)
Summary: another shot tonight?  Phone Note Call from Patient Call back at [facility] Phone [phone] Call back at [phone]   Summary of Call: Took a shot of Lantus pen there and Dr. Shela Commons told me to take shot at night.  Do I take another shot tonight since I took shot in his office? Initial call taken by: Rudy Jew, RN,  December 18, 2009 3:21 PM  Follow-up for Phone Call        none tonight- start tomorrow night Follow-up by: Willy Eddy, LPN,  December 18, 2009 3:34 PM  Additional Follow-up for Phone Call Additional follow up Details #1::        Phone Call Completed Additional Follow-up by: Rudy Jew, RN,  December 18, 2009 3:42 PM

## 2010-05-01 NOTE — Letter (Signed)
Summary: Diabetic Eye Exam/[facility]  Diabetic Eye Exam/[facility]   Imported By: Maryln Gottron 10/10/2008 13:08:21  _____________________________________________________________________  External Attachment:    Type:   Image     Comment:   External Document

## 2010-05-01 NOTE — Progress Notes (Signed)
Summary: pinkeye chest congestion  Phone Note Call from Patient Call back at [phone]   Summary of Call: [facility].  Allergic erythromycin & penicillin.  Pinkeye, grandson had, oozing & blurred vision.  Chest congestion.  Bronchitis bothering her.  Chest hurts with cough.  Green phlegm.  No fever.   10:45 per Dr. Lovell Sheehan. Initial call taken by: Rudy Jew, RN,  September 01, 2009 8:12 AM

## 2010-05-01 NOTE — Consult Note (Signed)
Summary: eye exam report  eye exam report   Imported By: Kassie Mends 10/09/2007 16:02:14  _____________________________________________________________________  External Attachment:    Type:   Image     Comment:   eye exam report

## 2010-05-01 NOTE — Letter (Signed)
Summary: [facility]  [facility]   Imported By: Maryln Gottron 04/18/2009 10:39:34  _____________________________________________________________________  External Attachment:    Type:   Image     Comment:   External Document

## 2010-05-01 NOTE — Assessment & Plan Note (Signed)
Summary: 2 month rov/njr   Vital Signs:  Patient profile:   65 year old female Weight:      236 pounds Temp:     97.9 degrees F oral Pulse rate:   96 / minute Pulse rhythm:   regular BP sitting:   132 / 82  (left arm) Cuff size:   large  Vitals Entered By: Alfred Levins, CMA (February 15, 2010 11:14 AM) CC: diabetes check up, Hypertension Management Is Patient Diabetic? Yes Did you bring your meter with you today? No   Primary Care Provider:  Stacie Glaze MD  CC:  diabetes check up and Hypertension Management.  History of Present Illness: has been checking glucoses and they seen to remain up Has been using diet soda... but has some sweet tea tries to walk every day feels that the other medications and the byetta worked best weigth increased has noted increased radicular back pain radiating to the left leg  Hypertension History:      She denies headache, chest pain, palpitations, dyspnea with exertion, orthopnea, PND, peripheral edema, visual symptoms, neurologic problems, syncope, and side effects from treatment.        Positive major cardiovascular risk factors include female age 42 years old or older, diabetes, hyperlipidemia, and hypertension.  Negative major cardiovascular risk factors include non-tobacco-user status.        Further assessment for target organ damage reveals no history of ASHD, stroke/TIA, or peripheral vascular disease.     Preventive Screening-Counseling & Management  Alcohol-Tobacco     Smoking Status: quit     Tobacco Counseling: to remain off tobacco products  Problems Prior to Update: 1)  Insect Bite, Leg  (ICD-916.4) 2)  Acute Bronchitis  (ICD-466.0) 3)  Hyperlipidemia  (ICD-272.4) 4)  Osteopenia  (ICD-733.90) 5)  Hypokalemia, Hx of  (ICD-V12.2) 6)  Myalgia  (ICD-729.1) 7)  Gastroparesis  (ICD-536.3) 8)  Health Maintenance Exam  (ICD-V70.0) 9)  Diabetes Mellitus, Type II  (ICD-250.00) 10)  Family History Diabetes 1st Degree Relative   (ICD-V18.0) 11)  Family History of Cad Female 1st Degree Relative <50  (ICD-V17.3) 12)  Hypothyroidism  (ICD-244.9) 13)  Hypertension  (ICD-401.9)  Medications Prior to Update: 1)  Synthroid 150 Mcg  Tabs (Levothyroxine Sodium) .... Once Daily 2)  Vitamin B-12   Tabs (Cyanocobalamin Tabs) .... Take 1 Tablet By Mouth Once A Day 3)  Lasix 40 Mg  Tabs (Furosemide) .... 2 Two Times A Day 4)  Glucosamine-Msm   Caps (Glucosamine Sulfate-Msm Caps) .... Once Daily 5)  Lotrel 10-40 Mg  Caps (Amlodipine Besy-Benazepril Hcl) .... Take 1 Tablet By Mouth Once A Day 6)  Vytorin 10-20 Mg  Tabs (Ezetimibe-Simvastatin) .... 1/2 Tab Once Daily. 7)  Zyrtec   Tabs (Cetirizine Hcl Tabs) .... As Needed 8)  Kombiglyze Xr 07-998 Mg Xr24h-Tab (Saxagliptin-Metformin) .... One By Mouth Daily 9)  Calcium 600 1500 Mg  Tabs (Calcium Carbonate) .... Two Times A Day 10)  Freestyle Lite Test   Strp (Glucose Blood) .... Use Two Times A Day To Test Blood Glucose 11)  Eql Fish Oil 1000 Mg Caps (Omega-3 Fatty Acids) .Marland Kitchen.. 1 Once Daily 12)  Ergocalciferol 50000 Unit Caps (Ergocalciferol) .Marland Kitchen.. 1 Every Week 13)  Lantus Solostar 100 Unit/ml Soln (Insulin Glargine) .... 20 U Subcutaneously  At Bedtime 14)  Tramadol Hcl 50 Mg Tabs (Tramadol Hcl) .... One Tab Every 6 Hours As Needed For Pain 15)  Vitamin D (Ergocalciferol) 50000 Unit Caps (Ergocalciferol) .Marland Kitchen.. 1 Every Week  For 3 Months 16)  Neomycin-Polymyxin-Hc 3.5-10000-1 Susp (Neomycin-Polymyxin-Hc) .... 4 Drops in Effected Eye 4 Times A Day 17)  Bpm Pe Dm 05-06-08 Mg/85ml Syrp (Phenylephrine-Bromphen-Dm) .... Two Tsp By Mouth  Q 6 Hours Prn 18)  Tramadol Hcl 50 Mg  Tabs (Tramadol Hcl) .... Take 1 Tablet By Mouth Two Times A Day As Needed Pain 19)  Clotrimazole-Betamethasone 1-0.05 % Crea (Clotrimazole-Betamethasone) .... Applu To Lesion Two Times A Day  Current Medications (verified): 1)  Synthroid 150 Mcg  Tabs (Levothyroxine Sodium) .... Once Daily 2)  Vitamin B-12   Tabs  (Cyanocobalamin Tabs) .... Take 1 Tablet By Mouth Once A Day 3)  Lasix 40 Mg  Tabs (Furosemide) .... 2 Two Times A Day 4)  Glucosamine-Msm   Caps (Glucosamine Sulfate-Msm Caps) .... Once Daily 5)  Lotrel 10-40 Mg  Caps (Amlodipine Besy-Benazepril Hcl) .... Take 1 Tablet By Mouth Once A Day 6)  Vytorin 10-20 Mg  Tabs (Ezetimibe-Simvastatin) .... 1/2 Tab Once Daily. 7)  Kombiglyze Xr 07-998 Mg Xr24h-Tab (Saxagliptin-Metformin) .... One By Mouth Daily 8)  Calcium 600 1500 Mg  Tabs (Calcium Carbonate) .... Two Times A Day 9)  Freestyle Lite Test   Strp (Glucose Blood) .... Use Two Times A Day To Test Blood Glucose 10)  Lantus Solostar 100 Unit/ml Soln (Insulin Glargine) .... 20 U Subcutaneously  At Bedtime 11)  Tramadol Hcl 50 Mg Tabs (Tramadol Hcl) .... One Tab Every 6 Hours As Needed For Pain 12)  Vitamin D (Ergocalciferol) 50000 Unit Caps (Ergocalciferol) .Marland Kitchen.. 1 Every Week For 3 Months  Allergies (verified): 1)  ! Erythromycin 2)  ! Pcn  Past History:  Family History: Last updated: 11/14/2006 Family History of CAD Female 1st degree relative <50 Family History Diabetes 1st degree relative Family History Hypertension  Social History: Last updated: 11/14/2006 Former Smoker Occupation: Married  Risk Factors: Exercise: yes (06/26/2007)  Risk Factors: Smoking Status: quit (02/15/2010)  Past medical, surgical, family and social histories (including risk factors) reviewed, and no changes noted (except as noted below).  Past Medical History: Reviewed history from 11/14/2006 and no changes required. Hypertension Hypothyroidism Diabetes Diabetes mellitus, type II  Past Surgical History: Reviewed history from 11/14/2006 and no changes required. knee surgery arthroscopy Hysterectomy Carpal tunnel release foot surgery  Family History: Reviewed history from 11/14/2006 and no changes required. Family History of CAD Female 1st degree relative <50 Family History Diabetes 1st degree  relative Family History Hypertension  Social History: Reviewed history from 11/14/2006 and no changes required. Former Smoker Occupation: Married  Review of Systems  The patient denies anorexia, fever, weight loss, weight gain, vision loss, decreased hearing, hoarseness, chest pain, syncope, dyspnea on exertion, peripheral edema, prolonged cough, headaches, hemoptysis, abdominal pain, melena, hematochezia, severe indigestion/heartburn, hematuria, incontinence, genital sores, muscle weakness, suspicious skin lesions, transient blindness, difficulty walking, depression, unusual weight change, abnormal bleeding, enlarged lymph nodes, angioedema, and breast masses.    Physical Exam  General:  Well-developed,well-nourished,in no acute distress; alert,appropriate and cooperative throughout examination Head:  normocephalic and atraumatic.   Eyes:  conjunctival injection and excessive tearing.   Ears:  R ear normal and L ear normal.   Mouth:  good dentition and pharynx pink and moist.   Lungs:  Normal respiratory effort, chest expands symmetrically. Lungs are clear to auscultation, no crackles or wheezes. Heart:  regular rateregular rhythm.     Impression & Recommendations:  Problem # 1:  DIABETES MELLITUS, TYPE II (ICD-250.00)  Her updated medication list for this problem includes:  Lotrel 10-40 Mg Caps (Amlodipine besy-benazepril hcl) .Marland Kitchen... Take 1 tablet by mouth once a day    Metformin Hcl 850 Mg Tabs (Metformin hcl) ..... One by mouth two times a day    Levemir Flexpen 100 Unit/ml Soln (Insulin detemir) .Marland KitchenMarland KitchenMarland KitchenMarland Kitchen 30 u subcutaneously at bedtime    Byetta 5 Mcg Pen 5 Mcg/0.74ml Soln (Exenatide) ..... One injection two times a day  Labs Reviewed: Creat: 0.6 (12/11/2009)     Last Eye Exam: normal (03/01/2008) Reviewed HgBA1c results: 9.7 (02/05/2010)  10.0 (12/11/2009)  Problem # 2:  HYPERLIPIDEMIA (ICD-272.4)  Her updated medication list for this problem includes:    Vytorin 10-20  Mg Tabs (Ezetimibe-simvastatin) .Marland Kitchen... 1/2 tab once daily.  Labs Reviewed: SGOT: 20 (12/11/2009)   SGPT: 25 (12/11/2009)  Lipid Goals: Chol Goal: 200 (07/28/2009)   HDL Goal: 40 (07/28/2009)   LDL Goal: 100 (07/28/2009)   TG Goal: 150 (07/28/2009)  Prior 10 Yr Risk Heart Disease: Not enough information (06/29/2008)   HDL:62.60 (12/11/2009), 56.20 (07/28/2009)  LDL:DEL (08/05/2006)  Chol:220 (12/11/2009), 214 (07/28/2009)  Trig:116.0 (12/11/2009), 99 (08/05/2006)  Problem # 3:  GASTROPARESIS (ICD-536.3) stable  Complete Medication List: 1)  Synthroid 150 Mcg Tabs (Levothyroxine sodium) .... Once daily 2)  Vitamin B-12 Tabs (Cyanocobalamin tabs) .... Take 1 tablet by mouth once a day 3)  Lasix 40 Mg Tabs (Furosemide) .... 2 two times a day 4)  Glucosamine-msm Caps (Glucosamine sulfate-msm caps) .... Once daily 5)  Lotrel 10-40 Mg Caps (Amlodipine besy-benazepril hcl) .... Take 1 tablet by mouth once a day 6)  Vytorin 10-20 Mg Tabs (Ezetimibe-simvastatin) .... 1/2 tab once daily. 7)  Metformin Hcl 850 Mg Tabs (Metformin hcl) .... One by mouth two times a day 8)  Calcium 600 1500 Mg Tabs (Calcium carbonate) .... Two times a day 9)  Freestyle Lite Test Strp (Glucose blood) .... Use two times a day to test blood glucose 10)  Levemir Flexpen 100 Unit/ml Soln (Insulin detemir) .... 30 u subcutaneously at bedtime 11)  Tramadol Hcl 50 Mg Tabs (Tramadol hcl) .... One tab every 6 hours as needed for pain 12)  Vitamin D (ergocalciferol) 50000 Unit Caps (Ergocalciferol) .Marland Kitchen.. 1 every week for 3 months 13)  Byetta 5 Mcg Pen 5 Mcg/0.76ml Soln (Exenatide) .... One injection two times a day  Hypertension Assessment/Plan:      The patient's hypertensive risk group is category C: Target organ damage and/or diabetes.  Today's blood pressure is 132/82.  Her blood pressure goal is < 130/80.  Patient Instructions: 1)  Please schedule a follow-up appointment in 1 months. Prescriptions: METFORMIN HCL 850 MG  TABS (METFORMIN HCL) one by mouth two times a day  #60 x 11   Entered and Authorized by:   Stacie Glaze MD   Signed by:   Stacie Glaze MD on 02/15/2010   Method used:   Electronically to        [facility]. [phone]* (retail)       [address] [place]  [address]       Ph: [phone] or [phone]       Fax: [phone]   RxID:   [id number]    Orders Added: 1)  Est. Patient Level IV [74259]

## 2010-05-01 NOTE — Assessment & Plan Note (Signed)
Summary: ROA/FUP/RCD   Vital Signs:  Patient profile:   65 year old female Height:      65 inches Weight:      225 pounds BMI:     37.58 Temp:     98.2 degrees F oral Pulse rate:   80 / minute Resp:     14 per minute BP sitting:   140 / 80  (left arm)  Vitals Entered By: Willy Eddy, LPN (July 28, 2009 1:55 PM) CC: roa, Hypertension Management, Lipid Management   CC:  roa, Hypertension Management, and Lipid Management.  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        Positive major cardiovascular risk factors include female age 20 years old or older, diabetes, hyperlipidemia, and hypertension.  Negative major cardiovascular risk factors include non-tobacco-user status.        Further assessment for target organ damage reveals no history of ASHD, stroke/TIA, or peripheral vascular disease.    Lipid Management History:      Positive NCEP/ATP III risk factors include female age 42 years old or older, diabetes, and hypertension.  Negative NCEP/ATP III risk factors include HDL cholesterol greater than 60, non-tobacco-user status, no ASHD (atherosclerotic heart disease), no prior stroke/TIA, no peripheral vascular disease, and no history of aortic aneurysm.      Preventive Screening-Counseling & Management  Alcohol-Tobacco     Smoking Status: quit  Problems Prior to Update: 1)  Osteopenia  (ICD-733.90) 2)  Hypokalemia, Hx of  (ICD-V12.2) 3)  Myalgia  (ICD-729.1) 4)  Gastroparesis  (ICD-536.3) 5)  Health Maintenance Exam  (ICD-V70.0) 6)   Diabetes Mellitus, Type II  (ICD-250.00) 7)  Family History Diabetes 1st Degree Relative  (ICD-V18.0) 8)  Family History of Cad Female 1st Degree Relative <50  (ICD-V17.3) 9)  Hypothyroidism  (ICD-244.9) 10)  Hypertension  (ICD-401.9)  Current Problems (verified): 1)  Osteopenia  (ICD-733.90) 2)  Hypokalemia, Hx of  (ICD-V12.2) 3)  Myalgia  (ICD-729.1) 4)  Gastroparesis  (ICD-536.3) 5)  Health Maintenance Exam  (ICD-V70.0) 6)  Diabetes Mellitus, Type II  (ICD-250.00) 7)  Family History Diabetes 1st Degree Relative  (ICD-V18.0) 8)  Family History of Cad Female 1st Degree Relative <50  (ICD-V17.3) 9)  Hypothyroidism  (ICD-244.9) 10)  Hypertension  (ICD-401.9)  Medications Prior to Update: 1)  Synthroid 150 Mcg  Tabs (Levothyroxine Sodium) .... Once Daily 2)  Vitamin B-12   Tabs (Cyanocobalamin Tabs) .... Take 1 Tablet By Mouth Once A Day 3)  Lasix 40 Mg  Tabs (Furosemide) .... 2 Two Times A Day 4)  Glucosamine-Msm   Caps (Glucosamine Sulfate-Msm Caps) .... Once Daily 5)  Lotrel 10-40 Mg  Caps (Amlodipine Besy-Benazepril Hcl) .... Take 1 Tablet By Mouth Once A Day 6)  Vytorin 10-20 Mg  Tabs (Ezetimibe-Simvastatin) .... 1/2 Tab Once Daily. 7)  Zyrtec   Tabs (Cetirizine Hcl Tabs) .... As Needed 8)  Glyburide-Metformin 5-500 Mg  Tabs (Glyburide-Metformin) .... Two Times A Day 9)  Calcium 600 1500 Mg  Tabs (Calcium Carbonate) .... Two Times A Day 10)  Freestyle  Lite Test   Strp (Glucose Blood) .... Use Two Times A Day To Test Blood Glucose 11)  Eql Fish Oil 1000 Mg Caps (Omega-3 Fatty Acids) .Marland Kitchen.. 1 Once Daily 12)  Ergocalciferol 50000 Unit Caps (Ergocalciferol) .Marland Kitchen.. 1 Every Week 13)  Byetta 5 Mcg Pen 5 Mcg/0.70ml Soln (Exenatide) .... One Shot Two Times A Day 14)  Tramadol Hcl 50 Mg Tabs (Tramadol Hcl) .... One Tab Every 6 Hours As Needed For Pain 15)  Vitamin D (Ergocalciferol) 50000 Unit Caps (Ergocalciferol) .Marland Kitchen.. 1 Every Week For 3 Months  Current Medications (verified): 1)  Synthroid 150  Mcg  Tabs (Levothyroxine Sodium) .... Once Daily 2)  Vitamin B-12   Tabs (Cyanocobalamin Tabs) .... Take 1 Tablet By Mouth Once A Day 3)  Lasix 40 Mg  Tabs (Furosemide) .... 2 Two Times A Day 4)  Glucosamine-Msm   Caps (Glucosamine Sulfate-Msm Caps) .... Once Daily 5)  Lotrel 10-40 Mg  Caps (Amlodipine Besy-Benazepril Hcl) .... Take 1 Tablet By Mouth Once A Day 6)  Vytorin 10-20 Mg  Tabs (Ezetimibe-Simvastatin) .... 1/2 Tab Once Daily. 7)  Zyrtec   Tabs (Cetirizine Hcl Tabs) .... As Needed 8)  Glyburide-Metformin 5-500 Mg  Tabs (Glyburide-Metformin) .... Two Times A Day 9)  Calcium 600 1500 Mg  Tabs (Calcium Carbonate) .... Two Times A Day 10)  Freestyle Lite Test   Strp (Glucose Blood) .... Use Two Times A Day To Test Blood Glucose 11)  Eql Fish Oil 1000 Mg Caps (Omega-3 Fatty Acids) .Marland Kitchen.. 1 Once Daily 12)  Ergocalciferol 50000 Unit Caps (Ergocalciferol) .Marland Kitchen.. 1 Every Week 13)  Byetta 5 Mcg Pen 5 Mcg/0.65ml Soln (Exenatide) .... One Shot Two Times A Day 14)  Tramadol Hcl 50 Mg Tabs (Tramadol Hcl) .... One Tab Every 6 Hours As Needed For Pain 15)  Vitamin D (Ergocalciferol) 50000 Unit Caps (Ergocalciferol) .Marland Kitchen.. 1 Every Week For 3 Months  Allergies (verified): 1)  ! Erythromycin 2)  ! Pcn  Past History:  Family History: Last updated: 11/14/2006 Family History of CAD Female 1st degree relative <50 Family History Diabetes 1st degree relative Family History Hypertension  Social History: Last updated: 11/14/2006 Former Smoker Occupation: Married  Risk Factors: Exercise: yes (06/26/2007)  Risk Factors: Smoking Status: quit (07/28/2009)  Past medical, surgical, family and social histories (including risk factors) reviewed, and no changes noted (except as noted below).  Past Medical History: Reviewed history from 11/14/2006 and no changes required. Hypertension Hypothyroidism Diabetes Diabetes mellitus, type II  Past Surgical History: Reviewed history from 11/14/2006 and no  changes required. knee surgery arthroscopy Hysterectomy Carpal tunnel release foot surgery  Family History: Reviewed history from 11/14/2006 and no changes required. Family History of CAD Female 1st degree relative <50 Family History Diabetes 1st degree relative Family History Hypertension  Social History: Reviewed history from 11/14/2006 and no changes required. Former Smoker Occupation: Married  Review of Systems  The patient denies anorexia, fever, weight loss, weight gain, vision loss, decreased hearing, hoarseness, chest pain, syncope, dyspnea on exertion, peripheral edema, prolonged cough, headaches, hemoptysis, abdominal pain, melena, severe indigestion/heartburn, hematuria, incontinence, genital sores, muscle weakness, suspicious skin lesions, transient blindness, difficulty walking, depression, unusual weight change, abnormal bleeding, enlarged lymph nodes, angioedema, and breast masses.    Physical Exam  General:  Well-developed,well-nourished,in no acute distress; alert,appropriate and cooperative throughout examination Head:  normocephalic and atraumatic.   Eyes:  pupils equal and pupils round.   Nose:  no external deformity and no nasal discharge.   Mouth:  good dentition and pharynx pink and moist.   Lungs:  Normal respiratory effort, chest expands symmetrically. Lungs are clear to auscultation, no crackles or wheezes. Heart:  regular rateregular rhythm.   Abdomen:  soft, non-tender, and normal bowel sounds.   Msk:  normal ROM and no joint deformities.   Extremities:  trace left pedal edema and trace right pedal edema.   Neurologic:  alert & oriented X3 and gait normal.     Impression & Recommendations:  Problem # 1:  HYPERLIPIDEMIA (ICD-272.4)  Her updated medication list for this problem includes:    Vytorin 10-20 Mg Tabs (Ezetimibe-simvastatin) .Marland Kitchen... 1/2 tab once daily.  Labs Reviewed: SGOT: 21 (08/05/2006)   SGPT: 30 (08/05/2006)  Lipid Goals: Chol  Goal: 200 (07/28/2009)   HDL Goal: 40 (07/28/2009)   LDL Goal: 100 (07/28/2009)   TG Goal: 150 (07/28/2009)  Prior 10 Yr Risk Heart Disease: Not enough information (06/29/2008)   HDL:71.6 (08/05/2006)  LDL:DEL (08/05/2006)  Chol:233 (08/05/2006)  Trig:99 (08/05/2006)  Orders: Venipuncture (04540) TLB-Cholesterol, HDL (83718-HDL) TLB-Cholesterol, Direct LDL (83721-DIRLDL) TLB-Cholesterol, Total (82465-CHO)  Problem # 2:  HYPOKALEMIA, HX OF (ICD-V12.2) Assessment: Improved prior to her surgery the potassium was noted to be low Orders: TLB-BMP (Basic Metabolic Panel-BMET) (80048-METABOL)  Problem # 3:  DIABETES MELLITUS, TYPE II (ICD-250.00) misses her second byeta Her updated medication list for this problem includes:    Lotrel 10-40 Mg Caps (Amlodipine besy-benazepril hcl) .Marland Kitchen... Take 1 tablet by mouth once a day    Glyburide-metformin 5-500 Mg Tabs (Glyburide-metformin) .Marland Kitchen..Marland Kitchen Two times a day    Byetta 5 Mcg Pen 5 Mcg/0.82ml Soln (Exenatide) ..... One shot two times a day  Labs Reviewed: Creat: 0.6 (03/14/2009)     Last Eye Exam: normal (03/01/2008) Reviewed HgBA1c results: 7.6 (08/31/2008)  8.6 (02/16/2008)  Problem # 4:  HYPERTENSION (ICD-401.9)  Her updated medication list for this problem includes:    Lasix 40 Mg Tabs (Furosemide) .Marland Kitchen... 2 two times a day    Lotrel 10-40 Mg Caps (Amlodipine besy-benazepril hcl) .Marland Kitchen... Take 1 tablet by mouth once a day  Orders: TLB-BMP (Basic Metabolic Panel-BMET) (80048-METABOL)  BP today: 140/80 Prior BP: 130/78 (03/14/2009)  Prior 10 Yr Risk Heart Disease: Not enough information (06/29/2008)  Labs Reviewed: K+: 3.0 (03/14/2009) Creat: : 0.6 (03/14/2009)   Chol: 233 (08/05/2006)   HDL: 71.6 (08/05/2006)   LDL: DEL (08/05/2006)   TG: 99 (08/05/2006)  Complete Medication List: 1)  Synthroid 150 Mcg Tabs (Levothyroxine sodium) .... Once daily 2)  Vitamin B-12 Tabs (Cyanocobalamin tabs) .... Take 1 tablet by mouth once a day 3)  Lasix  40 Mg Tabs (Furosemide) .... 2 two times a day 4)  Glucosamine-msm Caps (Glucosamine sulfate-msm caps) .... Once daily 5)  Lotrel 10-40 Mg Caps (Amlodipine besy-benazepril hcl) .... Take 1 tablet by mouth once a day 6)  Vytorin 10-20 Mg Tabs (Ezetimibe-simvastatin) .... 1/2 tab once daily. 7)  Zyrtec Tabs (Cetirizine hcl tabs) .... As needed 8)  Glyburide-metformin 5-500 Mg Tabs (Glyburide-metformin) .... Two times a day 9)  Calcium 600 1500 Mg Tabs (Calcium carbonate) .... Two times a day 10)  Freestyle Lite Test Strp (Glucose blood) .... Use two times a day to test blood glucose 11)  Eql Fish Oil 1000 Mg Caps (Omega-3 fatty acids) .Marland Kitchen.. 1 once daily 12)  Ergocalciferol 50000 Unit Caps (Ergocalciferol) .Marland Kitchen.. 1 every week 13)  Byetta 5 Mcg Pen 5 Mcg/0.31ml Soln (Exenatide) .... One shot two times a day 14)  Tramadol Hcl  50 Mg Tabs (Tramadol hcl) .... One tab every 6 hours as needed for pain 15)  Vitamin D (ergocalciferol) 50000 Unit Caps (Ergocalciferol) .Marland Kitchen.. 1 every week for 3 months  Other Orders: TLB-A1C / Hgb A1C (Glycohemoglobin) (83036-A1C)  Hypertension Assessment/Plan:      The patient's hypertensive risk group is category C: Target organ damage and/or diabetes.  Today's blood pressure is 140/80.  Her blood pressure goal is < 130/80.  Lipid Assessment/Plan:      Based on NCEP/ATP III, the patient's risk factor category is "history of diabetes".  The patient's lipid goals are as follows: Total cholesterol goal is 200; LDL cholesterol goal is 100; HDL cholesterol goal is 40; Triglyceride goal is 150.  Her LDL cholesterol goal has not been met.  Secondary causes for hyperlipidemia have been ruled out.  She has been counseled on adjunctive measures for lowering her cholesterol and has been provided with dietary instructions.    Patient Instructions: 1)  Please schedule a follow-up appointment in 3 months.

## 2010-05-03 NOTE — Assessment & Plan Note (Signed)
Summary: 1 month rov/njr/pt rescd from bump//ccm   Vital Signs:  Patient profile:   65 year old female Height:      65 inches Weight:      234 pounds BMI:     39.08 Temp:     98.2 degrees F oral Pulse rate:   80 / minute Resp:     14 per minute BP sitting:   140 / 80  (left arm)  Vitals Entered By: Willy Eddy, LPN (March 20, 2010 2:01 PM) CC: roa- c/o ankles swelling, Hypertension Management Is Patient Diabetic? Yes Did you bring your meter with you today? No   Primary Care Malin Cervini:  Stacie Glaze MD  CC:  roa- c/o ankles swelling and Hypertension Management.  History of Present Illness: the pt has notes ankle swelling right greater thanleft but if she increases the fluid pills she notes increased cramps she has noted blood glucoses in the 200 range has been runing out of the insulin pens she perfers the lantus   Hypertension History:      She complains of side effects from treatment, but denies headache, chest pain, palpitations, dyspnea with exertion, orthopnea, PND, peripheral edema, visual symptoms, neurologic problems, and syncope.  She notes the following problems with antihypertensive medication side effects: cramps in legs.        Positive major cardiovascular risk factors include female age 63 years old or older, diabetes, hyperlipidemia, and hypertension.  Negative major cardiovascular risk factors include non-tobacco-user status.        Further assessment for target organ damage reveals no history of ASHD, stroke/TIA, or peripheral vascular disease.     Preventive Screening-Counseling & Management  Alcohol-Tobacco     Smoking Status: quit     Tobacco Counseling: to remain off tobacco products  Problems Prior to Update: 1)  Insect Bite, Leg  (ICD-916.4) 2)  Acute Bronchitis  (ICD-466.0) 3)  Hyperlipidemia  (ICD-272.4) 4)  Osteopenia  (ICD-733.90) 5)  Hypokalemia, Hx of  (ICD-V12.2) 6)  Myalgia  (ICD-729.1) 7)  Gastroparesis  (ICD-536.3) 8)   Health Maintenance Exam  (ICD-V70.0) 9)  Diabetes Mellitus, Type II  (ICD-250.00) 10)  Family History Diabetes 1st Degree Relative  (ICD-V18.0) 11)  Family History of Cad Female 1st Degree Relative <50  (ICD-V17.3) 12)  Hypothyroidism  (ICD-244.9) 13)  Hypertension  (ICD-401.9)  Current Problems (verified): 1)  Insect Bite, Leg  (ICD-916.4) 2)  Acute Bronchitis  (ICD-466.0) 3)  Hyperlipidemia  (ICD-272.4) 4)  Osteopenia  (ICD-733.90) 5)  Hypokalemia, Hx of  (ICD-V12.2) 6)  Myalgia  (ICD-729.1) 7)  Gastroparesis  (ICD-536.3) 8)  Health Maintenance Exam  (ICD-V70.0) 9)  Diabetes Mellitus, Type II  (ICD-250.00) 10)  Family History Diabetes 1st Degree Relative  (ICD-V18.0) 11)  Family History of Cad Female 1st Degree Relative <50  (ICD-V17.3) 12)  Hypothyroidism  (ICD-244.9) 13)  Hypertension  (ICD-401.9)  Medications Prior to Update: 1)  Synthroid 150 Mcg  Tabs (Levothyroxine Sodium) .... Once Daily 2)  Vitamin B-12   Tabs (Cyanocobalamin Tabs) .... Take 1 Tablet By Mouth Once A Day 3)  Lasix 40 Mg  Tabs (Furosemide) .... 2 Two Times A Day 4)  Glucosamine-Msm   Caps (Glucosamine Sulfate-Msm Caps) .... Once Daily 5)  Lotrel 10-40 Mg  Caps (Amlodipine Besy-Benazepril Hcl) .... Take 1 Tablet By Mouth Once A Day 6)  Vytorin 10-20 Mg  Tabs (Ezetimibe-Simvastatin) .... 1/2 Tab Once Daily. 7)  Metformin Hcl 850 Mg Tabs (Metformin Hcl) .... One By Mouth  Two Times A Day 8)  Calcium 600 1500 Mg  Tabs (Calcium Carbonate) .... Two Times A Day 9)  Freestyle Lite Test   Strp (Glucose Blood) .... Use Two Times A Day To Test Blood Glucose 10)  Levemir Flexpen 100 Unit/ml Soln (Insulin Detemir) .... 30 U Subcutaneously At Bedtime 11)  Tramadol Hcl 50 Mg Tabs (Tramadol Hcl) .... One Tab Every 6 Hours As Needed For Pain 12)  Vitamin D (Ergocalciferol) 50000 Unit Caps (Ergocalciferol) .Marland Kitchen.. 1 Every Week For 3 Months 13)  Byetta 5 Mcg Pen 5 Mcg/0.76ml Soln (Exenatide) .... One Injection Two Times A  Day  Current Medications (verified): 1)  Synthroid 150 Mcg  Tabs (Levothyroxine Sodium) .... Once Daily 2)  Vitamin B-12   Tabs (Cyanocobalamin Tabs) .... Take 1 Tablet By Mouth Once A Day 3)  Lasix 40 Mg  Tabs (Furosemide) .... 2 Two Times A Day 4)  Glucosamine-Msm   Caps (Glucosamine Sulfate-Msm Caps) .... Once Daily 5)  Lotrel 10-40 Mg  Caps (Amlodipine Besy-Benazepril Hcl) .... Take 1 Tablet By Mouth Once A Day 6)  Vytorin 10-20 Mg  Tabs (Ezetimibe-Simvastatin) .... 1/2 Tab Once Daily. 7)  Metformin Hcl 850 Mg Tabs (Metformin Hcl) .... One By Mouth Two Times A Day 8)  Calcium 600 1500 Mg  Tabs (Calcium Carbonate) .... Two Times A Day 9)  Freestyle Lite Test   Strp (Glucose Blood) .... Use Two Times A Day To Test Blood Glucose 10)  Levemir Flexpen 100 Unit/ml Soln (Insulin Detemir) .... 30 U Subcutaneously At Bedtime 11)  Tramadol Hcl 50 Mg Tabs (Tramadol Hcl) .... One Tab Every 6 Hours As Needed For Pain 12)  Vitamin D (Ergocalciferol) 50000 Unit Caps (Ergocalciferol) .Marland Kitchen.. 1 Every Week For 3 Months 13)  Byetta 5 Mcg Pen 5 Mcg/0.56ml Soln (Exenatide) .... One Injection Two Times A Day  Allergies (verified): 1)  ! Erythromycin 2)  ! Pcn  Past History:  Family History: Last updated: 11/14/2006 Family History of CAD Female 1st degree relative <50 Family History Diabetes 1st degree relative Family History Hypertension  Social History: Last updated: 11/14/2006 Former Smoker Occupation: Married  Risk Factors: Exercise: yes (06/26/2007)  Risk Factors: Smoking Status: quit (03/20/2010)  Past medical, surgical, family and social histories (including risk factors) reviewed, and no changes noted (except as noted below).  Past Medical History: Reviewed history from 11/14/2006 and no changes required. Hypertension Hypothyroidism Diabetes Diabetes mellitus, type II  Past Surgical History: Reviewed history from 11/14/2006 and no changes required. knee surgery  arthroscopy Hysterectomy Carpal tunnel release foot surgery  Family History: Reviewed history from 11/14/2006 and no changes required. Family History of CAD Female 1st degree relative <50 Family History Diabetes 1st degree relative Family History Hypertension  Social History: Reviewed history from 11/14/2006 and no changes required. Former Smoker Occupation: Married  Review of Systems  The patient denies anorexia, fever, weight loss, weight gain, vision loss, decreased hearing, hoarseness, chest pain, syncope, dyspnea on exertion, peripheral edema, prolonged cough, headaches, hemoptysis, abdominal pain, melena, hematochezia, severe indigestion/heartburn, hematuria, incontinence, genital sores, muscle weakness, suspicious skin lesions, transient blindness, difficulty walking, depression, unusual weight change, abnormal bleeding, enlarged lymph nodes, angioedema, breast masses, and testicular masses.    Physical Exam  General:  Well-developed,well-nourished,in no acute distress; alert,appropriate and cooperative throughout examination Head:  normocephalic and atraumatic.   Eyes:  pupils equal and pupils round.   Ears:  R ear normal and L ear normal.   Nose:  no external deformity and no  nasal discharge.   Lungs:  Normal respiratory effort, chest expands symmetrically. Lungs are clear to auscultation, no crackles or wheezes. Heart:  regular rateregular rhythm.   Abdomen:  soft and no masses.   Extremities:  1+ left pedal edema and 1+ right pedal edema.  1+ left pedal edema.   Neurologic:  alert & oriented X3 and finger-to-nose normal.     Impression & Recommendations:  Problem # 1:  HYPOKALEMIA, HX OF (ICD-V12.2) the cramping comes from  the low potassium due to the DM and the use of the fluids pills  Problem # 2:  DIABETES MELLITUS, TYPE II (ICD-250.00)  Her updated medication list for this problem includes:    Lotrel 10-40 Mg Caps (Amlodipine besy-benazepril hcl) .Marland Kitchen... Take 1  tablet by mouth once a day    Metformin Hcl 850 Mg Tabs (Metformin hcl) ..... One by mouth two times a day    Lantus Solostar 100 Unit/ml Soln (Insulin glargine) .Marland KitchenMarland KitchenMarland KitchenMarland Kitchen 40 u at bed time    Byetta 5 Mcg Pen 5 Mcg/0.24ml Soln (Exenatide) ..... One injection two times a day  Labs Reviewed: Creat: 0.6 (12/11/2009)     Last Eye Exam: normal (03/01/2008) Reviewed HgBA1c results: 9.7 (02/05/2010)  10.0 (12/11/2009)  Orders: TLB-BMP (Basic Metabolic Panel-BMET) (80048-METABOL) Fingerstick (36416) TLB-A1C / Hgb A1C (Glycohemoglobin) (83036-A1C)  Problem # 3:  HYPERTENSION (ICD-401.9) measure potasssium and add supplimentation Her updated medication list for this problem includes:    Lasix 40 Mg Tabs (Furosemide) .Marland Kitchen... 2 two times a day    Lotrel 10-40 Mg Caps (Amlodipine besy-benazepril hcl) .Marland Kitchen... Take 1 tablet by mouth once a day  BP today: 140/80 Prior BP: 132/82 (02/15/2010)  Prior 10 Yr Risk Heart Disease: Not enough information (06/29/2008)  Labs Reviewed: K+: 4.2 (12/11/2009) Creat: : 0.6 (12/11/2009)   Chol: 220 (12/11/2009)   HDL: 62.60 (12/11/2009)   LDL: DEL (08/05/2006)   TG: 116.0 (12/11/2009)  Complete Medication List: 1)  Synthroid 150 Mcg Tabs (Levothyroxine sodium) .... Once daily 2)  Vitamin B-12 Tabs (Cyanocobalamin tabs) .... Take 1 tablet by mouth once a day 3)  Lasix 40 Mg Tabs (Furosemide) .... 2 two times a day 4)  Glucosamine-msm Caps (Glucosamine sulfate-msm caps) .... Once daily 5)  Lotrel 10-40 Mg Caps (Amlodipine besy-benazepril hcl) .... Take 1 tablet by mouth once a day 6)  Vytorin 10-20 Mg Tabs (Ezetimibe-simvastatin) .... 1/2 tab once daily. 7)  Metformin Hcl 850 Mg Tabs (Metformin hcl) .... One by mouth two times a day 8)  Calcium 600 1500 Mg Tabs (Calcium carbonate) .... Two times a day 9)  Freestyle Lite Test Strp (Glucose blood) .... Use two times a day to test blood glucose 10)  Lantus Solostar 100 Unit/ml Soln (Insulin glargine) .... 40 u at bed  time 11)  Tramadol Hcl 50 Mg Tabs (Tramadol hcl) .... One tab every 6 hours as needed for pain 12)  Byetta 5 Mcg Pen 5 Mcg/0.76ml Soln (Exenatide) .... One injection two times a day 13)  Bd Pen Needle Short U/f 31g X 8 Mm Misc (Insulin pen needle) .... To use with the lantus pen 14)  Klor-con M10 10 Meq Cr-tabs (Potassium chloride crys cr) .... One by mouth daily  Hypertension Assessment/Plan:      The patient's hypertensive risk group is category C: Target organ damage and/or diabetes.  Today's blood pressure is 140/80.  Her blood pressure goal is < 130/80.  Patient Instructions: 1)  new medicine:  2)    back  to the lantus but at a new dose 3)  add potassium one a day 4)  Please schedule a follow-up appointment in 3 months. Prescriptions: KLOR-CON M10 10 MEQ CR-TABS (POTASSIUM CHLORIDE CRYS CR) one by mouth daily  #30 x 11   Entered and Authorized by:   Stacie Glaze MD   Signed by:   Stacie Glaze MD on 03/20/2010   Method used:   Electronically to        [facility]. [phone]* (retail)       [address] [place]  [address]       Ph: [phone] or [phone]       Fax: [phone]   RxID:   [id number] BD PEN NEEDLE SHORT U/F 31G X 8 MM MISC (INSULIN PEN NEEDLE) to use with the lantus pen  #100 x 3   Entered and Authorized by:   Stacie Glaze MD   Signed by:   Stacie Glaze MD on 03/20/2010   Method used:   Electronically to        [facility] [phone]* (retail)       [address] [place]  [address]       Ph: [phone] or [phone]       Fax: [phone]   RxID:   [id number] LANTUS SOLOSTAR 100 UNIT/ML SOLN (INSULIN GLARGINE) 40 u at bed time  #4 pens x 11   Entered and Authorized by:   Stacie Glaze MD   Signed by:   Stacie Glaze MD on 03/20/2010   Method used:   Electron[facility] to        [facility]. [phone]* (retail)       [address] [place]  [address]       Ph: [phone] or [phone]        Fax: [phone]   RxID:   [id number]    Orders Added: 1)  TLB-BMP (Basic Metabolic Panel-BMET) [80048-METABOL] 2)  Fingerstick [36416] 3)  TLB-A1C / Hgb A1C (Glycohemoglobin) [83036-A1C] 4)  Est. Patient Level IV [78938]  Appended Document: Orders Update     Clinical Lists Changes  Orders: Added new Service order of Specimen Handling (10175) - Signed

## 2010-05-04 ENCOUNTER — Other Ambulatory Visit: Payer: Self-pay | Admitting: Internal Medicine

## 2010-05-04 DIAGNOSIS — E039 Hypothyroidism, unspecified: Secondary | ICD-10-CM

## 2010-06-21 ENCOUNTER — Other Ambulatory Visit: Payer: Self-pay | Admitting: Internal Medicine

## 2010-06-25 LAB — CBC
HCT: 40.2 % (ref 36.0–46.0)
Hemoglobin: 13.3 g/dL (ref 12.0–15.0)
MCHC: 33.1 g/dL (ref 30.0–36.0)
MCHC: 33.6 g/dL (ref 30.0–36.0)
MCV: 85 fL (ref 78.0–100.0)
MCV: 86 fL (ref 78.0–100.0)
RBC: 3.94 MIL/uL (ref 3.87–5.11)
RBC: 4.72 MIL/uL (ref 3.87–5.11)
RDW: 14 % (ref 11.5–15.5)
WBC: 12 10*3/uL — ABNORMAL HIGH (ref 4.0–10.5)

## 2010-06-25 LAB — COMPREHENSIVE METABOLIC PANEL
AST: 18 U/L (ref 0–37)
BUN: 8 mg/dL (ref 6–23)
CO2: 30 mEq/L (ref 19–32)
Calcium: 9.3 mg/dL (ref 8.4–10.5)
Chloride: 101 mEq/L (ref 96–112)
Creatinine, Ser: 0.5 mg/dL (ref 0.4–1.2)
GFR calc Af Amer: >60 mL/min (ref >60)
GFR calc non Af Amer: >60 mL/min (ref >60)
Glucose, Bld: 124 mg/dL — ABNORMAL HIGH (ref 70–99)
Total Bilirubin: 0.3 mg/dL (ref 0.3–1.2)

## 2010-06-25 LAB — URINALYSIS, ROUTINE W REFLEX MICROSCOPIC
Bilirubin Urine: NEGATIVE
Glucose, UA: NEGATIVE mg/dL
Ketones, ur: NEGATIVE mg/dL
Leukocytes, UA: NEGATIVE
Protein, ur: 100 mg/dL — AB
pH: 6 (ref 5.0–8.0)

## 2010-06-25 LAB — APTT: aPTT: 30 seconds (ref 24–37)

## 2010-06-25 LAB — PROTIME-INR
INR: 0.97 (ref 0.00–1.49)
Prothrombin Time: 12.8 seconds (ref 11.6–15.2)

## 2010-06-25 LAB — GLUCOSE, CAPILLARY: Glucose-Capillary: 177 mg/dL — ABNORMAL HIGH (ref 70–99)

## 2010-06-26 ENCOUNTER — Encounter: Payer: Self-pay | Admitting: Internal Medicine

## 2010-06-28 ENCOUNTER — Encounter: Payer: Self-pay | Admitting: Internal Medicine

## 2010-06-28 ENCOUNTER — Ambulatory Visit (INDEPENDENT_AMBULATORY_CARE_PROVIDER_SITE_OTHER): Payer: BC Managed Care – PPO | Admitting: Internal Medicine

## 2010-06-28 DIAGNOSIS — E039 Hypothyroidism, unspecified: Secondary | ICD-10-CM

## 2010-06-28 DIAGNOSIS — E119 Type 2 diabetes mellitus without complications: Secondary | ICD-10-CM

## 2010-06-28 DIAGNOSIS — E785 Hyperlipidemia, unspecified: Secondary | ICD-10-CM

## 2010-06-28 DIAGNOSIS — I10 Essential (primary) hypertension: Secondary | ICD-10-CM

## 2010-06-28 LAB — HEMOGLOBIN A1C: Hgb A1c MFr Bld: 8.9 % — ABNORMAL HIGH (ref 4.6–6.5)

## 2010-06-28 MED ORDER — TRAMADOL HCL 50 MG PO TABS: 50.0000 mg | ORAL_TABLET | Freq: Four times a day (QID) | ORAL | Status: AC | PRN

## 2010-06-28 MED ORDER — AMLODIPINE-OLMESARTAN 5-40 MG PO TABS: 1.0000 | ORAL_TABLET | Freq: Every day | ORAL | Status: DC

## 2010-06-28 NOTE — Patient Instructions (Signed)
The new blood pressure medicine called AZOR you take in place of the amlodipine with benazepril. Be  faithful with taking her Lasix and her potassium every day Pt may take the tramadol for the back pain Weight loss is the key

## 2010-06-28 NOTE — Assessment & Plan Note (Signed)
Due to her blood pressure being out of control and her lower extremity swelling we will monitor her both a TSH T3 and T4 today

## 2010-06-28 NOTE — Assessment & Plan Note (Signed)
Continue the Vytorin samples will be provided

## 2010-06-28 NOTE — Assessment & Plan Note (Signed)
The Lotrel that she takes the 10 mg of amlodipine which may contribute to her lower extremity edema just to take Lasix we will adjust her Lotrel to less amlodipine and asked her to remain faithful with the Lasix and potassium

## 2010-06-28 NOTE — Progress Notes (Signed)
  Subjective:    Patient ID: Marissa Roberts, female    DOB: 17-Aug-1945, 65 y.o.   MRN: [id number]  HPI Patient is a 65 year old female patient with diabetes hypothyroidism and hypertension who presents today with a chief complaint of swelling in her legs bilaterally since of low back pain and elevation of her blood pressure her blood pressure is 160/84 today she did not take her blood pressure medicine this morning but I believe that she has had recent readings at other doctor's office as there are also elevated suggesting her blood pressures out of control in addition to the lower extremity edema.   Review of Systems  Constitutional: Negative for activity change, appetite change and fatigue.  HENT: Negative for ear pain, congestion, neck pain, postnasal drip and sinus pressure.   Eyes: Negative for redness and visual disturbance.  Respiratory: Negative for cough, shortness of breath and wheezing.   Cardiovascular: Positive for leg swelling. Negative for chest pain and palpitations.  Gastrointestinal: Negative for abdominal pain and abdominal distention.  Genitourinary: Negative for dysuria, frequency and menstrual problem.  Musculoskeletal: Positive for joint swelling. Negative for myalgias and arthralgias.  Skin: Negative for rash and wound.  Neurological: Negative for dizziness, weakness and headaches.  Hematological: Negative for adenopathy. Does not bruise/bleed easily.  Psychiatric/Behavioral: Negative for sleep disturbance and decreased concentration.       Past Medical History  Diagnosis Date  . Hypertension   . Thyroid disease   . Diabetes mellitus    Past Surgical History  Procedure Date  . Knee arhtroscopy   . Abdominal hysterectomy   . Carpel tunnel   . Foot surgery     reports that she quit smoking about 20 years ago. She does not have any smokeless tobacco history on file. She reports that she does not drink alcohol or use illicit drugs. family history includes  Coronary artery disease in an unspecified family member; Diabetes in an unspecified family member; Heart disease in an unspecified family member; and Hypertension in an unspecified family member.  She is adopted. Allergies  Allergen Reactions  . Erythromycin     REACTION: rash  . Penicillins     REACTION: rash    Objective:   Physical Exam  Constitutional: She is oriented to person, place, and time. She appears well-developed and well-nourished. No distress.  HENT:  Head: Normocephalic and atraumatic.  Right Ear: External ear normal.  Left Ear: External ear normal.  Nose: Nose normal.  Mouth/Throat: Oropharynx is clear and moist.  Eyes: Conjunctivae and EOM are normal. Pupils are equal, round, and reactive to light.  Neck: Normal range of motion. Neck supple. No JVD present. No tracheal deviation present. No thyromegaly present.  Cardiovascular: Normal rate, regular rhythm, normal heart sounds and intact distal pulses.   No murmur heard. Pulmonary/Chest: Effort normal and breath sounds normal. She has no wheezes. She exhibits no tenderness.  Abdominal: Soft. Bowel sounds are normal.  Musculoskeletal: Normal range of motion. She exhibits no edema and no tenderness.       Lordosis  And SI joint tenderness  Lymphadenopathy:    She has no cervical adenopathy.  Neurological: She is alert and oriented to person, place, and time. She has normal reflexes. No cranial nerve deficit.  Skin: Skin is warm and dry. She is not diaphoretic.  Psychiatric: She has a normal mood and affect. Her behavior is normal.          Assessment & Plan:

## 2010-06-28 NOTE — Assessment & Plan Note (Signed)
She brings with her a list of her blood glucoses in the month of March they have been relatively stable with a high of 169 but most blood sugars averaging around 140

## 2010-07-19 ENCOUNTER — Other Ambulatory Visit: Payer: Self-pay | Admitting: Internal Medicine

## 2010-08-17 NOTE — Op Note (Signed)
Marissa Roberts, Marissa Roberts             ACCOUNT NO.:  [id number]   MEDICAL RECORD NO.:  [id number]          PATIENT TYPE:  AMB   LOCATION:  [facility]                          FACILITY:  [facility]   PHYSICIAN:  Rodney A. Mortenson, M.D.DATE OF BIRTH:  02-23-1946   DATE OF PROCEDURE:  03/20/2006  DATE OF DISCHARGE:                               OPERATIVE REPORT   INDICATIONS FOR PROCEDURE:  A 65 year old female who has left knee pain  along the medial joint line.  The pain is increasing.  She has trouble  rolling over in bed.  There is acute tenderness along the medial joint  line.  There is a moderate amount of patellofemoral crepitus.  Negative  anterior drawer.  Small effusion.  Because of persistent pain which  would not resolve, an MRI of the knee was done and this shows  tricompartmental degenerative changes with significant patellofemoral  joint, cartilage damage and chondromalacia of patellar rates grade III  to grade IV.  There was some degenerative change in the medial meniscus.  No discrete meniscal tears seen.  Small loose bodies seen in the  posterior space.   Because of persistent pain without resolution, it was felt that  arthroscopic evaluation and treatment was indicated and necessary.  Preoperatively, complications were discussed, questions answered and  encouraged extensively.   JUSTIFICATION FOR OUTPATIENT PROCEDURE:  Minimal morbidity.   PREOPERATIVE DIAGNOSIS:  Probable tear medial meniscus left knee.   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   OPERATION:  Chondroplasty patella.   SURGEON:  Lenard Galloway. Chaney Malling, M.D.   ANESTHESIA:  MAC.   PATHOLOGY:  We arthroscoped the knee and very careful examination of the  knee was undertaken.  The patellofemoral joint was visualized first.  There was grade III and grade IV cartilage damage in the  posterior  aspect of the patella. __________ notch had some large straight down the  anterior aspect of the femur.  The arthroscope was placed in the  interchondylar notch,  anterior cruciate ligament appeared fairly  normal.  In the lateral compartment, there was normal articular  cartilage of lateral femoral condyle, lateral tibial plateau and the  entire circumference of lateral meniscus was normal.  With the scope in  the medial compartment, there was a large ridge of osteophytes over the  distal end of the medial femoral condyle.  The articular surface of the  femoral condyle and the tibia appeared fairly normal and the entire  circumference of the medial meniscus was palpated and visualized very  carefully and no tears were seen.  No loose bodies were seen in either  the superior pouch or medial lateral recesses.   PROCEDURE:  The patient placed on the operating table in the supine  position with a pneumatic tourniquet about the left upper thigh.  The  entire left lower extremity was prepped with DuraPrep and draped out in  the usual manner.  An infusion can was placed in the superior medial  pouch and the knee distended with saline.  Anteromedial and  anterolateral portals were made with findings as described above.  The  only significant pathology seen at the patellofemoral junction.  At the  posterior aspect of the patella there was frayed and torn cartilage and  this was debrided back to smooth stable rim through both medial and  lateral portals.  Some striations over the anterior aspect of the  femoral notch which were not debrided.  The rest  of the knee appeared  fairly normal except for the ridge of osteophytes along the medial  femoral condyle.  No ___________ of loose bodies were visualized.  Marcaine was then placed in the knee, a large bulky pressure dressing  applied.  The patient returned to the recovery room in excellent  condition.  Technically, the procedure went  extremely well.   FOLLOW-UP CARE:  1. See me in my office on Monday.  2. Percocet for pain.           ______________________________  Lenard Galloway. Chaney Malling, M.D.     RAM/MEDQ  D:  03/20/2006  T:  03/20/2006  Job:  [id number]

## 2010-08-23 ENCOUNTER — Encounter: Payer: Self-pay | Admitting: Internal Medicine

## 2010-08-23 ENCOUNTER — Ambulatory Visit (INDEPENDENT_AMBULATORY_CARE_PROVIDER_SITE_OTHER): Payer: BC Managed Care – PPO | Admitting: Internal Medicine

## 2010-08-23 VITALS — BP 144/90 | HR 76 | Temp 98.2°F | Resp 16 | Ht 65.0 in | Wt 236.0 lb

## 2010-08-23 DIAGNOSIS — K635 Polyp of colon: Secondary | ICD-10-CM | POA: Insufficient documentation

## 2010-08-23 DIAGNOSIS — E119 Type 2 diabetes mellitus without complications: Secondary | ICD-10-CM

## 2010-08-23 DIAGNOSIS — D126 Benign neoplasm of colon, unspecified: Secondary | ICD-10-CM

## 2010-08-23 DIAGNOSIS — K3184 Gastroparesis: Secondary | ICD-10-CM

## 2010-08-23 DIAGNOSIS — E785 Hyperlipidemia, unspecified: Secondary | ICD-10-CM

## 2010-08-23 DIAGNOSIS — I10 Essential (primary) hypertension: Secondary | ICD-10-CM

## 2010-08-23 MED ORDER — METOCLOPRAMIDE HCL 5 MG PO TABS: 5.0000 mg | ORAL_TABLET | Freq: Every day | ORAL | Status: DC

## 2010-08-23 MED ORDER — EXENATIDE 5 MCG/0.02ML ~~LOC~~ SOPN: 10.0000 ug | PEN_INJECTOR | Freq: Two times a day (BID) | SUBCUTANEOUS | Status: DC

## 2010-08-23 NOTE — Progress Notes (Signed)
  Subjective:    Patient ID: Marissa Roberts, female    DOB: 1946-01-08, 65 y.o.   MRN: [id number]  HPI Patient is 65 year old with a history of hypertension diabetes hypothyroidism who had a screening colonoscopy which revealed polyps which were felt to be precancerous and she has a followup colonoscopy in one year.  This has been scheduled at [facility] clinic should complications during a colonoscopy which resulted in an ultrasound.  The ultrasound was nondiagnostic and a CT scan was scheduled the CT scan was complicated by an allergic reaction to the contrast medium.  The patient has had difficulty controlling her blood sugars because of the time she was off her metformin as well as having some persistent abdominal discomfort. We have not received a copy of the CT scan and will request it from the [facility]   Review of Systems View systems is negative with the exception of right upper quadrant discomfort.  We will obtain the ultrasound and the CT scan to make sure there is no gallbladder disease.  Past Medical History  Diagnosis Date  . Hypertension   . Thyroid disease   . Diabetes mellitus    Past Surgical History  Procedure Date  . Knee arhtroscopy   . Abdominal hysterectomy   . Carpel tunnel   . Foot surgery     reports that she quit smoking about 20 years ago. She does not have any smokeless tobacco history on file. She reports that she does not drink alcohol or use illicit drugs. family history includes Coronary artery disease in an unspecified family member; Diabetes in her mother; Heart disease in her mother; and Hyperlipidemia in her mother.  She is adopted. Allergies  Allergen Reactions  . Contrast Media (Iodinated Diagnostic Agents)     Throat swells from ct con tra  . Erythromycin     REACTION: rash  . Penicillins     REACTION: rash       Objective:   Physical Exam    on physical examination she is a pleasant African American female in no apparent  distress her blood pressure was 144/90 initially G. and T. fields pupils are equal round reactive light accommodation lungs toes were clear heart examination showed regular rate and rhythm Abdomen exam reveals no focal masses bowel sounds present but faint in all 4 quadrants patient has trace edema in her lower extremities      Assessment & Plan:  Patient's blood sugars have been poorly controlled she is currently on Glucophage 850 twice a day Lantus 40 units daily and she was taking Byetta but has been out of this medicine for 2 weeks  Her blood pressure is controlled with amlodipine-Benicar and she is on Vytorin for her statin   She is suffering mild gastroparesis probably due to her diabetes.  She has pain after eating to treat with a low dose of Reglan

## 2010-10-26 ENCOUNTER — Ambulatory Visit: Payer: BC Managed Care – PPO | Admitting: Internal Medicine

## 2010-11-02 ENCOUNTER — Encounter: Payer: Self-pay | Admitting: Internal Medicine

## 2010-11-02 ENCOUNTER — Ambulatory Visit (INDEPENDENT_AMBULATORY_CARE_PROVIDER_SITE_OTHER): Payer: BC Managed Care – PPO | Admitting: Internal Medicine

## 2010-11-02 VITALS — BP 132/84 | HR 76 | Temp 98.2°F | Resp 16 | Ht 65.0 in | Wt 234.0 lb

## 2010-11-02 DIAGNOSIS — M1710 Unilateral primary osteoarthritis, unspecified knee: Secondary | ICD-10-CM

## 2010-11-02 DIAGNOSIS — E1165 Type 2 diabetes mellitus with hyperglycemia: Secondary | ICD-10-CM

## 2010-11-02 DIAGNOSIS — I10 Essential (primary) hypertension: Secondary | ICD-10-CM

## 2010-11-02 DIAGNOSIS — E1169 Type 2 diabetes mellitus with other specified complication: Secondary | ICD-10-CM

## 2010-11-02 MED ORDER — INSULIN GLARGINE 100 UNIT/ML ~~LOC~~ SOLN: 50.0000 [IU] | Freq: Every day | SUBCUTANEOUS | Status: DC

## 2010-11-02 NOTE — Progress Notes (Signed)
  Subjective:    Patient ID: Marissa Roberts, female    DOB: 04-10-1945, 65 y.o.   MRN: [id number]  HPI Patient is a pleasant 65 year old female who presents for followup of hypertension hypothyroidism and diabetes her blood pressure is well-controlled on her current medications and her thyroid disease is well controlled on her current thyroid replacement.  Her TSH was excellent as well as her blood pressure today however she has an A1c above are recommended level of 7 and action is needed to get that A1c down under control    Review of Systems  Constitutional: Negative for activity change, appetite change and fatigue.  HENT: Negative for ear pain, congestion, neck pain, postnasal drip and sinus pressure.   Eyes: Negative for redness and visual disturbance.  Respiratory: Negative for cough, shortness of breath and wheezing.   Gastrointestinal: Negative for abdominal pain and abdominal distention.  Genitourinary: Negative for dysuria, frequency and menstrual problem.  Musculoskeletal: Negative for myalgias, joint swelling and arthralgias.  Skin: Negative for rash and wound.  Neurological: Negative for dizziness, weakness and headaches.  Hematological: Negative for adenopathy. Does not bruise/bleed easily.  Psychiatric/Behavioral: Negative for sleep disturbance and decreased concentration.       Objective:   Physical Exam  Constitutional: She is oriented to person, place, and time. She appears well-developed and well-nourished. No distress.       Obese female  HENT:  Head: Normocephalic and atraumatic.  Right Ear: External ear normal.  Left Ear: External ear normal.  Nose: Nose normal.  Mouth/Throat: Oropharynx is clear and moist.  Eyes: Conjunctivae and EOM are normal. Pupils are equal, round, and reactive to light.  Neck: Normal range of motion. Neck supple. No JVD present. No tracheal deviation present. No thyromegaly present.  Cardiovascular: Normal rate, regular rhythm,  normal heart sounds and intact distal pulses.   No murmur heard. Pulmonary/Chest: Effort normal and breath sounds normal. She has no wheezes. She exhibits no tenderness.  Abdominal: Soft. Bowel sounds are normal.  Musculoskeletal: Normal range of motion. She exhibits no edema and no tenderness.  Lymphadenopathy:    She has no cervical adenopathy.  Neurological: She is alert and oriented to person, place, and time. She has normal reflexes. No cranial nerve deficit.  Skin: Skin is warm and dry. She is not diaphoretic.  Psychiatric: She has a normal mood and affect. Her behavior is normal.          Assessment & Plan:  Patient's husband is present at time of examination and both admit that the diet is less than optimal. Weight gain as well as dietary indiscretion and areas of ice cream and sweets as well as cookies have complicated control of her diabetes and her weight.  We discussed the risks of poorly controlled diabetes hyperlipidemia and hypertension in terms of coronary disease as well as stroke patient and the patient's husband were counseled more than 30 minutes about her diabetes and to the extreme risks of continuing her current course of noncompliant.  A hemoglobin A1c a lipid panel will be drawn prior to next visit we've urged at least a 5 pound weight loss prior to the visit and better compliance with diet and exercise

## 2011-01-03 ENCOUNTER — Ambulatory Visit (INDEPENDENT_AMBULATORY_CARE_PROVIDER_SITE_OTHER): Payer: BC Managed Care – PPO | Admitting: Internal Medicine

## 2011-01-03 ENCOUNTER — Encounter: Payer: Self-pay | Admitting: Internal Medicine

## 2011-01-03 VITALS — BP 140/82 | HR 76 | Temp 98.2°F | Resp 16 | Ht 65.0 in | Wt 234.0 lb

## 2011-01-03 DIAGNOSIS — E039 Hypothyroidism, unspecified: Secondary | ICD-10-CM

## 2011-01-03 DIAGNOSIS — E1169 Type 2 diabetes mellitus with other specified complication: Secondary | ICD-10-CM

## 2011-01-03 DIAGNOSIS — I10 Essential (primary) hypertension: Secondary | ICD-10-CM

## 2011-01-03 DIAGNOSIS — Z23 Encounter for immunization: Secondary | ICD-10-CM

## 2011-01-03 DIAGNOSIS — E119 Type 2 diabetes mellitus without complications: Secondary | ICD-10-CM

## 2011-01-03 DIAGNOSIS — K3184 Gastroparesis: Secondary | ICD-10-CM

## 2011-01-03 DIAGNOSIS — M1710 Unilateral primary osteoarthritis, unspecified knee: Secondary | ICD-10-CM

## 2011-01-03 DIAGNOSIS — E1165 Type 2 diabetes mellitus with hyperglycemia: Secondary | ICD-10-CM

## 2011-01-03 DIAGNOSIS — B079 Viral wart, unspecified: Secondary | ICD-10-CM

## 2011-01-03 DIAGNOSIS — E785 Hyperlipidemia, unspecified: Secondary | ICD-10-CM

## 2011-01-03 MED ORDER — INSULIN GLARGINE 100 UNIT/ML ~~LOC~~ SOLN: 50.0000 [IU] | Freq: Every day | SUBCUTANEOUS | Status: DC

## 2011-01-03 MED ORDER — METOCLOPRAMIDE HCL 5 MG PO TABS: 5.0000 mg | ORAL_TABLET | Freq: Every day | ORAL | Status: DC

## 2011-01-03 NOTE — Patient Instructions (Addendum)
Just take the Reglan before bedtime this helps to move things through your stomach better.  The Prilosec that the gastroenterologist gave you helps to calm the acid in the stomach.   To be able to get off some of these medications you have to lose weight!

## 2011-01-03 NOTE — Progress Notes (Signed)
  Subjective:    Patient ID: Marissa Roberts, female    DOB: 1945-11-19, 65 y.o.   MRN: [id number]  HPI Patient is a 65 year old [place] American female who is followed for hypertension hypothyroidism elevated cholesterol and diabetes.  Her husband is with her today and reports that she does not follow a very good diet for both her cholesterol and for diabetes  following her weights, it is apparent that she has not been exercising self-control nor following an appropriate diet She has an acute complaint today of wartlike lesion on her face This appeared several weeks ago and has increased in size  Wart on face Review of Systems  Constitutional: Negative for activity change, appetite change and fatigue.  HENT: Negative for ear pain, congestion, neck pain, postnasal drip and sinus pressure.   Eyes: Negative for redness and visual disturbance.  Respiratory: Negative for cough, shortness of breath and wheezing.   Gastrointestinal: Negative for abdominal pain and abdominal distention.  Genitourinary: Negative for dysuria, frequency and menstrual problem.  Musculoskeletal: Negative for myalgias, joint swelling and arthralgias.  Skin: Negative for rash and wound.  Neurological: Negative for dizziness, weakness and headaches.  Hematological: Negative for adenopathy. Does not bruise/bleed easily.  Psychiatric/Behavioral: Negative for sleep disturbance and decreased concentration.       Objective:   Physical Exam  Nursing note and vitals reviewed. Constitutional: She is oriented to person, place, and time. She appears well-developed and well-nourished. No distress.  HENT:  Head: Normocephalic and atraumatic.  Right Ear: External ear normal.  Left Ear: External ear normal.  Nose: Nose normal.  Mouth/Throat: Oropharynx is clear and moist.  Eyes: Conjunctivae and EOM are normal. Pupils are equal, round, and reactive to light.  Neck: Normal range of motion. Neck supple. No JVD present. No  tracheal deviation present. No thyromegaly present.  Cardiovascular: Normal rate, regular rhythm, normal heart sounds and intact distal pulses.   No murmur heard. Pulmonary/Chest: Effort normal and breath sounds normal. She has no wheezes. She exhibits no tenderness.  Abdominal: Soft. Bowel sounds are normal.  Musculoskeletal: Normal range of motion. She exhibits no edema and no tenderness.  Lymphadenopathy:    She has no cervical adenopathy.  Neurological: She is alert and oriented to person, place, and time. She has normal reflexes. No cranial nerve deficit.  Skin: Skin is warm and dry. She is not diaphoretic.       Wart on upper left temple  Psychiatric: She has a normal mood and affect. Her behavior is normal.          Assessment & Plan:  The patient's blood pressure stable on her current medication regimen if I monitor to monitor her compliance with her thyroid medications and A1c should be obtained to monitor her compliance with her diabetes.   Informed consent was obtained and the site was treated with 20 % acetic acid. The lesion appeared to change with treatment the patient tolerated the procedure well aftercare instructions were given to the patient

## 2011-02-22 ENCOUNTER — Other Ambulatory Visit: Payer: Self-pay | Admitting: Internal Medicine

## 2011-03-12 ENCOUNTER — Other Ambulatory Visit: Payer: Self-pay | Admitting: Internal Medicine

## 2011-03-18 ENCOUNTER — Ambulatory Visit (INDEPENDENT_AMBULATORY_CARE_PROVIDER_SITE_OTHER): Payer: BC Managed Care – PPO

## 2011-03-18 DIAGNOSIS — B029 Zoster without complications: Secondary | ICD-10-CM

## 2011-03-18 DIAGNOSIS — L270 Generalized skin eruption due to drugs and medicaments taken internally: Secondary | ICD-10-CM

## 2011-03-18 DIAGNOSIS — M549 Dorsalgia, unspecified: Secondary | ICD-10-CM

## 2011-03-20 ENCOUNTER — Other Ambulatory Visit: Payer: Self-pay | Admitting: *Deleted

## 2011-03-20 DIAGNOSIS — E039 Hypothyroidism, unspecified: Secondary | ICD-10-CM

## 2011-03-20 MED ORDER — FUROSEMIDE 40 MG PO TABS: 80.0000 mg | ORAL_TABLET | Freq: Two times a day (BID) | ORAL | Status: DC

## 2011-03-20 MED ORDER — SYNTHROID 150 MCG PO TABS: 150.0000 ug | ORAL_TABLET | Freq: Every day | ORAL | Status: DC

## 2011-03-20 MED ORDER — METFORMIN HCL 850 MG PO TABS: 850.0000 mg | ORAL_TABLET | Freq: Two times a day (BID) | ORAL | Status: DC

## 2011-03-20 MED ORDER — OMEPRAZOLE 40 MG PO CPDR: 40.0000 mg | DELAYED_RELEASE_CAPSULE | Freq: Every day | ORAL | Status: DC

## 2011-03-20 MED ORDER — POTASSIUM CHLORIDE 10 MEQ PO TBCR: 10.0000 meq | EXTENDED_RELEASE_TABLET | Freq: Every day | ORAL | Status: DC

## 2011-03-29 ENCOUNTER — Other Ambulatory Visit (INDEPENDENT_AMBULATORY_CARE_PROVIDER_SITE_OTHER): Payer: BC Managed Care – PPO

## 2011-03-29 DIAGNOSIS — E785 Hyperlipidemia, unspecified: Secondary | ICD-10-CM

## 2011-03-29 DIAGNOSIS — K3184 Gastroparesis: Secondary | ICD-10-CM

## 2011-03-29 DIAGNOSIS — E119 Type 2 diabetes mellitus without complications: Secondary | ICD-10-CM

## 2011-03-29 LAB — HEPATIC FUNCTION PANEL
ALT: 29 U/L (ref 0–35)
Alkaline Phosphatase: 54 U/L (ref 39–117)
Bilirubin, Direct: 0 mg/dL (ref 0.0–0.3)
Total Bilirubin: 0.4 mg/dL (ref 0.3–1.2)

## 2011-03-29 LAB — LIPID PANEL
LDL Cholesterol: 93 mg/dL (ref 0–99)
Total CHOL/HDL Ratio: 4
Triglycerides: 177 mg/dL — ABNORMAL HIGH (ref 0.0–149.0)

## 2011-03-29 LAB — HEMOGLOBIN A1C: Hgb A1c MFr Bld: 7.9 % — ABNORMAL HIGH (ref 4.6–6.5)

## 2011-04-05 ENCOUNTER — Encounter: Payer: Self-pay | Admitting: Internal Medicine

## 2011-04-05 ENCOUNTER — Ambulatory Visit (INDEPENDENT_AMBULATORY_CARE_PROVIDER_SITE_OTHER): Payer: BC Managed Care – PPO | Admitting: Internal Medicine

## 2011-04-05 VITALS — BP 150/84 | HR 84 | Temp 98.2°F | Resp 16 | Ht 65.0 in | Wt 227.0 lb

## 2011-04-05 DIAGNOSIS — B0229 Other postherpetic nervous system involvement: Secondary | ICD-10-CM

## 2011-04-05 DIAGNOSIS — I10 Essential (primary) hypertension: Secondary | ICD-10-CM

## 2011-04-05 DIAGNOSIS — E119 Type 2 diabetes mellitus without complications: Secondary | ICD-10-CM

## 2011-04-05 DIAGNOSIS — K219 Gastro-esophageal reflux disease without esophagitis: Secondary | ICD-10-CM

## 2011-04-05 DIAGNOSIS — Z8619 Personal history of other infectious and parasitic diseases: Secondary | ICD-10-CM

## 2011-04-05 DIAGNOSIS — E1165 Type 2 diabetes mellitus with hyperglycemia: Secondary | ICD-10-CM

## 2011-04-05 MED ORDER — EXENATIDE 5 MCG/0.02ML ~~LOC~~ SOPN: 5.0000 ug | PEN_INJECTOR | Freq: Two times a day (BID) | SUBCUTANEOUS | Status: AC

## 2011-04-05 MED ORDER — OMEPRAZOLE 40 MG PO CPDR: 40.0000 mg | DELAYED_RELEASE_CAPSULE | Freq: Every day | ORAL | Status: DC

## 2011-04-05 MED ORDER — HYDROCODONE-ACETAMINOPHEN 5-500 MG PO TABS: 1.0000 | ORAL_TABLET | Freq: Four times a day (QID) | ORAL | Status: DC | PRN

## 2011-04-05 NOTE — Patient Instructions (Addendum)
The patient is instructed to continue all medications as prescribed. Schedule followup with check out clerk upon leaving the clinic Pain the shingles should gradually decrease over the next week and return to normal if the pain in the leg persists then you would call back I will call in a prescription for gabapentin

## 2011-04-05 NOTE — Progress Notes (Signed)
Subjective:    Patient ID: Marissa Roberts, female    DOB: January 16, 1946, 66 y.o.   MRN: [id number]  HPI  Presents for follow up of lipid Pt has the shingles Dec 17th Has persistant pain,  Was treated with 1 gm tid for 7 days. She had  a significant distribution down her right leg. Blood pressure slightly elevated due to pain today cholesterol and liver aren't goal hemoglobin A1c is significantly improved for a third time from 9.8-8.9-7.9 today.  Review of Systems  Constitutional: Negative for activity change, appetite change and fatigue.  HENT: Negative for ear pain, congestion, neck pain, postnasal drip and sinus pressure.   Eyes: Negative for redness and visual disturbance.  Respiratory: Negative for cough, shortness of breath and wheezing.   Gastrointestinal: Negative for abdominal pain and abdominal distention.  Genitourinary: Negative for dysuria, frequency and menstrual problem.  Musculoskeletal: Negative for myalgias, joint swelling and arthralgias.  Skin: Positive for color change and rash. Negative for wound.  Neurological: Negative for dizziness, weakness and headaches.  Hematological: Negative for adenopathy. Does not bruise/bleed easily.  Psychiatric/Behavioral: Negative for sleep disturbance and decreased concentration.   Past Medical History  Diagnosis Date  . Hypertension   . Thyroid disease   . Diabetes mellitus     History   Social History  . Marital Status: Married    Spouse Name: N/A    Number of Children: N/A  . Years of Education: N/A   Occupational History  . Not on file.   Social History Main Topics  . Smoking status: Former Smoker    Quit date: 04/01/1990  . Smokeless tobacco: Not on file  . Alcohol Use: No  . Drug Use: No  . Sexually Active: Not on file   Other Topics Concern  . Not on file   Social History Narrative  . No narrative on file    Past Surgical History  Procedure Date  . Knee arhtroscopy   . Abdominal hysterectomy   .  Carpel tunnel   . Foot surgery     Family History  Problem Relation Age of Onset  . Adopted: Yes  . Coronary artery disease    . Diabetes Mother   . Heart disease Mother   . Hyperlipidemia Mother     Allergies  Allergen Reactions  . Contrast Media (Iodinated Diagnostic Agents)     Throat swells from ct con tra  . Erythromycin     REACTION: rash  . Penicillins     REACTION: rash    Current Outpatient Prescriptions on File Prior to Visit  Medication Sig Dispense Refill  . amLODipine-olmesartan (AZOR) 5-40 MG per tablet Take 1 tablet by mouth daily.      . Calcium Carbonate (CALCIUM 600) 1500 MG TABS Take by mouth 2 (two) times daily.        Marland Kitchen ezetimibe-simvastatin (VYTORIN) 10-20 MG per tablet Take by mouth. 1/2 tab daily        . FREESTYLE LITE test strip USE TWICE DAILY TO TEST BLOOD SUGAR  100 each  4  . furosemide (LASIX) 40 MG tablet Take 2 tablets (80 mg total) by mouth 2 (two) times daily.  360 tablet  3  . glucosamine-chondroitin 500-400 MG tablet Take 1 tablet by mouth daily.        . insulin glargine (LANTUS SOLOSTAR) 100 UNIT/ML injection Inject 50 Units into the skin at bedtime.  15 mL  4  . Insulin Pen Needle (B-D ULTRAFINE III SHORT PEN)  31G X 8 MM MISC by Does not apply route. To use with lantus        . metFORMIN (GLUCOPHAGE) 850 MG tablet Take 1 tablet (850 mg total) by mouth 2 (two) times daily with a meal.  180 tablet  3  . omeprazole (PRILOSEC) 40 MG capsule Take 1 capsule (40 mg total) by mouth daily.  90 capsule  3  . potassium chloride (KLOR-CON) 10 MEQ CR tablet Take 1 tablet (10 mEq total) by mouth daily.  90 tablet  3  . SYNTHROID 150 MCG tablet Take 1 tablet (150 mcg total) by mouth daily.  90 each  3  . vitamin B-12 (CYANOCOBALAMIN) 100 MCG tablet Take 2,500 mcg by mouth daily.         BP 150/84  Pulse 84  Temp 98.2 F (36.8 C)  Resp 16  Ht 5\' 5"  (1.651 m)  Wt 227 lb (102.967 kg)  BMI 37.77 kg/m2       Objective:   Physical Exam    Nursing note and vitals reviewed. Constitutional: She is oriented to person, place, and time. She appears well-developed and well-nourished. No distress.  HENT:  Head: Normocephalic and atraumatic.  Right Ear: External ear normal.  Left Ear: External ear normal.  Nose: Nose normal.  Mouth/Throat: Oropharynx is clear and moist.  Eyes: Conjunctivae and EOM are normal. Pupils are equal, round, and reactive to light.  Neck: Normal range of motion. Neck supple. No JVD present. No tracheal deviation present. No thyromegaly present.  Cardiovascular: Normal rate, regular rhythm, normal heart sounds and intact distal pulses.   No murmur heard. Pulmonary/Chest: Effort normal and breath sounds normal. She has no wheezes. She exhibits no tenderness.  Abdominal: Soft. Bowel sounds are normal.  Musculoskeletal: Normal range of motion. She exhibits no edema and no tenderness.  Lymphadenopathy:    She has no cervical adenopathy.  Neurological: She is alert and oriented to person, place, and time. She has normal reflexes. No cranial nerve deficit.  Skin: Skin is warm and dry. Rash noted. She is not diaphoretic. There is erythema.  Psychiatric: She has a normal mood and affect. Her behavior is normal.          Assessment & Plan:  The patient has a painful postherpetic neuropathy but it is improving and she is now approximately 3 weeks out from the initial rash.  She has a drying rash on her legs that is resolving but she had a significant dermatomal involvement on her right leg.  I would recommend that we continue pain control with the Vicodin will give her a prescription for an additional 40 tablets if the pain persists then Neurontin may be used to help to extinguish the nerve ending inflammation and pain.  Her diabetes is in much better control with another one point dropped her A1c we applaud that her liver functions are normal and her cholesterol is much improved coming down from 220 total to  178.

## 2011-04-16 ENCOUNTER — Other Ambulatory Visit: Payer: Self-pay | Admitting: *Deleted

## 2011-05-29 ENCOUNTER — Other Ambulatory Visit: Payer: Self-pay | Admitting: *Deleted

## 2011-05-29 DIAGNOSIS — E119 Type 2 diabetes mellitus without complications: Secondary | ICD-10-CM

## 2011-05-29 MED ORDER — INSULIN GLARGINE 100 UNIT/ML ~~LOC~~ SOLN: 50.0000 [IU] | Freq: Every day | SUBCUTANEOUS | Status: DC

## 2011-08-02 ENCOUNTER — Encounter: Payer: Self-pay | Admitting: Internal Medicine

## 2011-08-02 ENCOUNTER — Ambulatory Visit (INDEPENDENT_AMBULATORY_CARE_PROVIDER_SITE_OTHER): Payer: BC Managed Care – PPO | Admitting: Internal Medicine

## 2011-08-02 VITALS — BP 138/78 | HR 84 | Temp 98.1°F | Resp 16 | Ht 65.0 in | Wt 230.0 lb

## 2011-08-02 DIAGNOSIS — IMO0001 Reserved for inherently not codable concepts without codable children: Secondary | ICD-10-CM

## 2011-08-02 DIAGNOSIS — J329 Chronic sinusitis, unspecified: Secondary | ICD-10-CM

## 2011-08-02 DIAGNOSIS — I10 Essential (primary) hypertension: Secondary | ICD-10-CM

## 2011-08-02 LAB — BASIC METABOLIC PANEL
Chloride: 101 mEq/L (ref 96–112)
Creatinine, Ser: 0.7 mg/dL (ref 0.4–1.2)
Potassium: 3.2 mEq/L — ABNORMAL LOW (ref 3.5–5.1)

## 2011-08-02 LAB — HEMOGLOBIN A1C: Hgb A1c MFr Bld: 7.9 % — ABNORMAL HIGH (ref 4.6–6.5)

## 2011-08-02 MED ORDER — HYDROCODONE-HOMATROPINE 5-1.5 MG/5ML PO SYRP: 5.0000 mL | ORAL_SOLUTION | Freq: Four times a day (QID) | ORAL | Status: AC | PRN

## 2011-08-02 MED ORDER — LEVOFLOXACIN 500 MG PO TABS: 500.0000 mg | ORAL_TABLET | Freq: Every day | ORAL | Status: AC

## 2011-08-02 MED ORDER — LEVOFLOXACIN 500 MG PO TABS: 500.0000 mg | ORAL_TABLET | Freq: Every day | ORAL | Status: DC

## 2011-08-02 NOTE — Patient Instructions (Signed)
The patient is instructed to continue all medications as prescribed. Schedule followup with check out clerk upon leaving the clinic  

## 2011-08-02 NOTE — Progress Notes (Signed)
Addended by: Stacie Glaze on: 08/02/2011 10:51 AM   Modules accepted: Orders

## 2011-08-02 NOTE — Progress Notes (Signed)
Addended by: Willy Eddy on: 08/02/2011 10:59 AM   Modules accepted: Orders

## 2011-08-02 NOTE — Progress Notes (Signed)
Subjective:    Patient ID: Marissa Roberts, female    DOB: [birth year], 66 y.o.   MRN: [id number]  HPI This is a 66 year old female who presents for followup of hypertension hypothyroidism and adult onset diabetes who has been sick for the past 2 days and has an acute complaint of sinus congestion and postnasal drip that has been severe enough that it interfered with sleep.   No fever or chills, she's had some night sweats.  Her blood pressure is stable today.  The patient's A1c has been gradually improving by about 1 each visit over the past year   Review of Systems  Constitutional: Negative for activity change, appetite change and fatigue.  HENT: Negative for ear pain, congestion, neck pain, postnasal drip and sinus pressure.   Eyes: Negative for redness and visual disturbance.  Respiratory: Negative for cough, shortness of breath and wheezing.   Gastrointestinal: Negative for abdominal pain and abdominal distention.  Genitourinary: Negative for dysuria, frequency and menstrual problem.  Musculoskeletal: Negative for myalgias, joint swelling and arthralgias.  Skin: Negative for rash and wound.  Neurological: Negative for dizziness, weakness and headaches.  Hematological: Negative for adenopathy. Does not bruise/bleed easily.  Psychiatric/Behavioral: Negative for sleep disturbance and decreased concentration.   Past Medical History  Diagnosis Date  . Hypertension   . Thyroid disease   . Diabetes mellitus     History   Social History  . Marital Status: Married    Spouse Name: N/A    Number of Children: N/A  . Years of Education: N/A   Occupational History  . Not on file.   Social History Main Topics  . Smoking status: Former Smoker    Quit date: 04/01/1990  . Smokeless tobacco: Not on file  . Alcohol Use: No  . Drug Use: No  . Sexually Active: Not on file   Other Topics Concern  . Not on file   Social History Narrative  . No narrative on file    Past  Surgical History  Procedure Date  . Knee arhtroscopy   . Abdominal hysterectomy   . Carpel tunnel   . Foot surgery     Family History  Problem Relation Age of Onset  . Adopted: Yes  . Coronary artery disease    . Diabetes Mother   . Heart disease Mother   . Hyperlipidemia Mother     Allergies  Allergen Reactions  . Contrast Media (Iodinated Diagnostic Agents)     Throat swells from ct con tra  . Erythromycin     REACTION: rash  . Penicillins     REACTION: rash    Current Outpatient Prescriptions on File Prior to Visit  Medication Sig Dispense Refill  . amLODipine-olmesartan (AZOR) 5-40 MG per tablet Take 1 tablet by mouth daily.      . Calcium Carbonate (CALCIUM 600) 1500 MG TABS Take by mouth 2 (two) times daily.        Marland Kitchen ezetimibe-simvastatin (VYTORIN) 10-20 MG per tablet Take by mouth. 1/2 tab daily        . FREESTYLE LITE test strip USE TWICE DAILY TO TEST BLOOD SUGAR  100 each  4  . furosemide (LASIX) 40 MG tablet Take 2 tablets (80 mg total) by mouth 2 (two) times daily.  360 tablet  3  . glucosamine-chondroitin 500-400 MG tablet Take 1 tablet by mouth daily.        Marland Kitchen HYDROcodone-acetaminophen (VICODIN) 5-500 MG per tablet Take 1 tablet by mouth every 6 (  six) hours as needed.  30 tablet  1  . insulin glargine (LANTUS SOLOSTAR) 100 UNIT/ML injection Inject 50 Units into the skin at bedtime.  15 mL  3  . Insulin Pen Needle (B-D ULTRAFINE III SHORT PEN) 31G X 8 MM MISC by Does not apply route. To use with lantus        . levothyroxine (SYNTHROID) 150 MCG tablet Take 150 mcg by mouth daily. Per [facility] pharmacist- this is name brand ,but labeled as generic-will come as l-thyroxine, but will be synthroid      . metFORMIN (GLUCOPHAGE) 850 MG tablet Take 1 tablet (850 mg total) by mouth 2 (two) times daily with a meal.  180 tablet  3  . omeprazole (PRILOSEC) 40 MG capsule Take 1 capsule (40 mg total) by mouth daily.  90 capsule  3  . potassium chloride (KLOR-CON) 10 MEQ CR  tablet Take 1 tablet (10 mEq total) by mouth daily.  90 tablet  3  . vitamin B-12 (CYANOCOBALAMIN) 100 MCG tablet Take 2,500 mcg by mouth daily.         BP 138/78  Pulse 84  Temp 98.1 F (36.7 C)  Resp 16  Ht 5\' 5"  (1.651 m)  Wt 230 lb (104.327 kg)  BMI 38.27 kg/m2        Objective:   Physical Exam  Nursing note and vitals reviewed. Constitutional: She is oriented to person, place, and time. She appears well-developed and well-nourished. No distress.  HENT:  Head: Normocephalic and atraumatic.  Right Ear: External ear normal.  Left Ear: External ear normal.  Nose: Nose normal.  Mouth/Throat: Oropharynx is clear and moist.  Eyes: Conjunctivae and EOM are normal. Pupils are equal, round, and reactive to light.  Neck: Normal range of motion. Neck supple. No JVD present. No tracheal deviation present. No thyromegaly present.  Cardiovascular: Normal rate, regular rhythm, normal heart sounds and intact distal pulses.   No murmur heard. Pulmonary/Chest: Effort normal and breath sounds normal. She has no wheezes. She exhibits no tenderness.  Abdominal: Soft. Bowel sounds are normal.  Musculoskeletal: Normal range of motion. She exhibits no edema and no tenderness.  Lymphadenopathy:    She has no cervical adenopathy.  Neurological: She is alert and oriented to person, place, and time. She has normal reflexes. No cranial nerve deficit.  Skin: Skin is warm and dry. She is not diaphoretic.  Psychiatric: She has a normal mood and affect. Her behavior is normal.          Assessment & Plan:  Patient has an acute upper laboratory tract infection it looks like bacterial sinusitis on physical examination therefore begin an antibiotic and cough medicine for her because she has cough from the postnasal drip.  She is followed for her diabetes we'll get a hemoglobin A1c today her blood pressure stable measure of basic metabolic panel.

## 2011-09-23 ENCOUNTER — Emergency Department (HOSPITAL_COMMUNITY): Payer: BC Managed Care – PPO

## 2011-09-23 ENCOUNTER — Telehealth: Payer: Self-pay | Admitting: Internal Medicine

## 2011-09-23 ENCOUNTER — Emergency Department (HOSPITAL_COMMUNITY)
Admission: EM | Admit: 2011-09-23 | Discharge: 2011-09-23 | Disposition: A | Discharge status: Home / Self Care | Payer: BC Managed Care – PPO | Attending: Emergency Medicine | Admitting: Emergency Medicine

## 2011-09-23 ENCOUNTER — Encounter (HOSPITAL_COMMUNITY): Payer: Self-pay | Admitting: *Deleted

## 2011-09-23 DIAGNOSIS — M7989 Other specified soft tissue disorders: Secondary | ICD-10-CM | POA: Insufficient documentation

## 2011-09-23 DIAGNOSIS — R0602 Shortness of breath: Secondary | ICD-10-CM | POA: Insufficient documentation

## 2011-09-23 DIAGNOSIS — E079 Disorder of thyroid, unspecified: Secondary | ICD-10-CM | POA: Insufficient documentation

## 2011-09-23 DIAGNOSIS — Z794 Long term (current) use of insulin: Secondary | ICD-10-CM | POA: Insufficient documentation

## 2011-09-23 DIAGNOSIS — Z79899 Other long term (current) drug therapy: Secondary | ICD-10-CM | POA: Insufficient documentation

## 2011-09-23 DIAGNOSIS — I10 Essential (primary) hypertension: Secondary | ICD-10-CM | POA: Insufficient documentation

## 2011-09-23 DIAGNOSIS — E119 Type 2 diabetes mellitus without complications: Secondary | ICD-10-CM | POA: Insufficient documentation

## 2011-09-23 DIAGNOSIS — R600 Localized edema: Secondary | ICD-10-CM

## 2011-09-23 DIAGNOSIS — I509 Heart failure, unspecified: Secondary | ICD-10-CM | POA: Insufficient documentation

## 2011-09-23 HISTORY — DX: Heart failure, unspecified: I50.9

## 2011-09-23 LAB — CBC
Hemoglobin: 12.3 g/dL (ref 12.0–15.0)
MCV: 80.8 fL (ref 78.0–100.0)
Platelets: 289 10*3/uL (ref 150–400)
RBC: 4.47 MIL/uL (ref 3.87–5.11)
WBC: 13 10*3/uL — ABNORMAL HIGH (ref 4.0–10.5)

## 2011-09-23 LAB — POCT I-STAT, CHEM 8
BUN: 8 mg/dL (ref 6–23)
Chloride: 101 mEq/L (ref 96–112)
Glucose, Bld: 245 mg/dL — ABNORMAL HIGH (ref 70–99)
HCT: 38 % (ref 36.0–46.0)
Potassium: 3.6 mEq/L (ref 3.5–5.1)

## 2011-09-23 LAB — POCT I-STAT TROPONIN I: Troponin i, poc: 0.01 ng/mL (ref 0.00–0.08)

## 2011-09-23 LAB — D-DIMER, QUANTITATIVE: D-Dimer, Quant: 0.62 ug/mL-FEU — ABNORMAL HIGH (ref 0.00–0.48)

## 2011-09-23 MED ORDER — ENOXAPARIN SODIUM 100 MG/ML ~~LOC~~ SOLN
100.0000 mg | Freq: Once | SUBCUTANEOUS | Status: AC
  Administered 2011-09-23: 100 mg via SUBCUTANEOUS
  Filled 2011-09-23: qty 1

## 2011-09-23 NOTE — ED Notes (Signed)
To ED for eval of increased sob with exertion. Pt is able to speak in full sentences, but states she has noticed that she needs to rest more.

## 2011-09-23 NOTE — ED Notes (Signed)
Diet Coke given per pt request; family at bedside

## 2011-09-23 NOTE — Telephone Encounter (Signed)
Dr Lovell Sheehan agrees -t o ed

## 2011-09-23 NOTE — Telephone Encounter (Signed)
Caller: [place]/Patient; PCP: Darryll Capers; CB#: [phone]; ; ; Call regarding Feet Swollen;  Pt calling regarding SOB, edema in lower extremeties and decreased urine output (even though pt is taking 2 more Lasix a day than her norma 4). Pt SOB is audible throughout triage with constant clearing cough. Pt was out of town in [employer] all weekend. Afebrile. Disp: See Ed Immediately per Breathing Problems. Called to speak to [place] or [place] with [place]. Informed her of pt disposition and sxs. Consulted with MD-advised no appt today-pt may go to UC or ED-Triaging Rn's discretion. Advised pt to be seen at ED Immediately per protocol. Pt states husband should be home from the store soon and she will have him take her to [facility]. Advised to call back or call 911 if any sxs worsen before he gets there. Pt verbalized understanding.

## 2011-09-23 NOTE — ED Notes (Signed)
Lovenox ordered from pharmacy.

## 2011-09-23 NOTE — ED Notes (Signed)
Bladder scan done by tech from 5100 - 256 cc in bladder at this time - pt states emptied bladder x2 hrs ago

## 2011-09-23 NOTE — ED Provider Notes (Addendum)
History     CSN: [id number]  Arrival date & time 09/23/11  1701   First MD Initiated Contact with Patient 09/23/11 1836      Chief Complaint  Patient presents with  . Shortness of Breath  . Leg Swelling    (Consider location/radiation/quality/duration/timing/severity/associated sxs/prior treatment) HPI Comments: This note is being amended after the fact because of missing documentation.  I am amending it to the best of my memory from the chart at this time.  Patient presented with increasing bilateral lower extremity swelling.  She had been increasing her Lasix at home with minimal symptom release.  Patient presented for her persistent symptoms.  The history is provided by the patient.    Past Medical History  Diagnosis Date  . Hypertension   . Thyroid disease   . Diabetes mellitus   . CHF (congestive heart failure)     Past Surgical History  Procedure Date  . Knee arhtroscopy   . Abdominal hysterectomy   . Carpel tunnel   . Foot surgery     Family History  Problem Relation Age of Onset  . Adopted: Yes  . Coronary artery disease    . Diabetes Mother   . Heart disease Mother   . Hyperlipidemia Mother     History  Substance Use Topics  . Smoking status: Former Smoker    Quit date: 04/01/1990  . Smokeless tobacco: Not on file  . Alcohol Use: No    OB History    Grav Para Term Preterm Abortions TAB SAB Ect Mult Living                  Review of Systems  Constitutional: Negative.   HENT: Negative.   Eyes: Negative.   Cardiovascular: Positive for leg swelling.  Genitourinary: Negative.   Musculoskeletal: Negative.   Skin: Negative.   Neurological: Negative.   Hematological: Negative.   Psychiatric/Behavioral: Negative.     Allergies  Contrast media; Erythromycin; and Penicillins  Home Medications   Current Outpatient Rx  Name Route Sig Dispense Refill  . AMLODIPINE-OLMESARTAN 5-40 MG PO TABS Oral Take 1 tablet by mouth daily.    Marland Kitchen CALCIUM  CARBONATE 1500 MG PO TABS Oral Take by mouth 2 (two) times daily.      Marland Kitchen EZETIMIBE-SIMVASTATIN 10-20 MG PO TABS Oral Take 0.5 tablets by mouth at bedtime. 1/2 tab daily     . FUROSEMIDE 40 MG PO TABS Oral Take 80 mg by mouth 2 (two) times daily.    Marland Kitchen GLUCOSAMINE-CHONDROITIN 500-400 MG PO TABS Oral Take 1 tablet by mouth daily.      Marland Kitchen HYDROCODONE-ACETAMINOPHEN 5-500 MG PO TABS Oral Take 1 tablet by mouth every 6 (six) hours as needed. For back pain    . INSULIN GLARGINE 100 UNIT/ML [place] SOLN Subcutaneous Inject 50 Units into the skin at bedtime.    Marland Kitchen LEVOTHYROXINE SODIUM 150 MCG PO TABS Oral Take 150 mcg by mouth daily. Per [facility] pharmacist- this is name brand ,but labeled as generic-will come as l-thyroxine, but will be synthroid    . METFORMIN HCL 850 MG PO TABS Oral Take 850 mg by mouth 2 (two) times daily with a meal.    . OMEPRAZOLE 20 MG PO CPDR Oral Take 40 mg by mouth daily.    Marland Kitchen POTASSIUM CHLORIDE CRYS ER 10 MEQ PO TBCR Oral Take 10 mEq by mouth daily.    Marland Kitchen VITAMIN B-12 100 MCG PO TABS Oral Take 100 mcg by mouth daily.    Marland Kitchen  FREESTYLE LITE TEST VI STRP  USE TWICE DAILY TO TEST BLOOD SUGAR 100 each 4  . INSULIN PEN NEEDLE 31G X 8 MM MISC Does not apply by Does not apply route. To use with lantus        BP 133/52  Pulse 82  Temp 98 F (36.7 C) (Oral)  Resp 18  SpO2 96%  Physical Exam  Nursing note and vitals reviewed. Constitutional: She is oriented to person, place, and time. She appears well-developed and well-nourished.  Non-toxic appearance. She does not have a sickly appearance.  HENT:  Head: Normocephalic and atraumatic.  Eyes: Conjunctivae, EOM and lids are normal. Pupils are equal, round, and reactive to light.  Neck: Trachea normal and normal range of motion. Neck supple.  Cardiovascular: Normal rate, regular rhythm and normal heart sounds.   Pulmonary/Chest: Effort normal and breath sounds normal.  Abdominal: Soft. Normal appearance. There is no CVA tenderness.    Musculoskeletal: Normal range of motion. She exhibits edema.  Neurological: She is alert and oriented to person, place, and time. She has normal strength.  Skin: Skin is warm, dry and intact. No rash noted.  Psychiatric: She has a normal mood and affect. Her behavior is normal. Judgment and thought content normal.    ED Course  Procedures (including critical care time)  Results for orders placed during the hospital encounter of 09/23/11  CBC      Component Value Range   WBC 13.0 (*) 4.0 - 10.5 K/uL   RBC 4.47  3.87 - 5.11 MIL/uL   Hemoglobin 12.3  12.0 - 15.0 g/dL   HCT 96.0  45.4 - 09.8 %   MCV 80.8  78.0 - 100.0 fL   MCH 27.5  26.0 - 34.0 pg   MCHC 34.1  30.0 - 36.0 g/dL   RDW 11.9  14.7 - 82.9 %   Platelets 289  150 - 400 K/uL  PRO B NATRIURETIC PEPTIDE      Component Value Range   Pro B Natriuretic peptide (BNP) 29.4  0 - 125 pg/mL  POCT I-STAT, CHEM 8      Component Value Range   Sodium 142  135 - 145 mEq/L   Potassium 3.6  3.5 - 5.1 mEq/L   Chloride 101  96 - 112 mEq/L   BUN 8  6 - 23 mg/dL   Creatinine, Ser 5.62  0.50 - 1.10 mg/dL   Glucose, Bld 130 (*) 70 - 99 mg/dL   Calcium, Ion 8.65  7.84 - 1.32 mmol/L   TCO2 28  0 - 100 mmol/L   Hemoglobin 12.9  12.0 - 15.0 g/dL   HCT 69.6  29.5 - 28.4 %  POCT I-STAT TROPONIN I      Component Value Range   Troponin i, poc 0.01  0.00 - 0.08 ng/mL   Comment 3           D-DIMER, QUANTITATIVE      Component Value Range   D-Dimer, Quant 0.62 (*) 0.00 - 0.48 ug/mL-FEU   Dg Chest 2 View  09/23/2011  *RADIOLOGY REPORT*  Clinical Data: 65 year old female with chest tightness, shortness of breath, lower extremity swelling, productive cough.  CHEST - 2 VIEW  Comparison: None.  Findings: Lung volumes are within normal limits.  Cardiac size and mediastinal contours are within normal limits.  Visualized tracheal air column is within normal limits.  No pneumothorax, pulmonary edema, pleural effusion or confluent pulmonary opacity.  Mild  scoliosis. No acute osseous abnormality identified.  IMPRESSION: No acute cardiopulmonary abnormality.  Original Report Authenticated By: Harley Hallmark, M.D.      MDM  Patient with bilateral lower extremity swelling of unclear etiology.  I did consider possible new-onset congestive heart failure but as the patient has a normal BNP this seems unlikely.  Patient has normal renal function at this time as well.  Patient has no findings for new cardiac problems or associated chest pain.  Patient is already been increasing her Lasix dose at home without significant relief although she has noted some decrease in the left leg.  Patient has no specific risk factors for DVT as she has no cancer, no recent immobility, no history of DVT.  And had noted swelling in both legs.  I did consider obtaining a d-dimer to help further delineate that this is not an ongoing process since patient has no other obvious cause for her lower extremity swelling.  As the d-dimer is positive I do feel she will warrant further evaluation with a vascular ultrasound tomorrow.  In the meantime I would give the patient a dose of Lovenox to treat for potential DVT while awaiting her study tomorrow.  If the patient's vital signs are normal I feel she is safe for discharge home with plans to have the lower extremity duplex completed and followup with her primary care physician as an outpatient.        Nat Christen, MD 09/23/11 5621  Nat Christen, MD 10/17/11 [phone]

## 2011-09-23 NOTE — Discharge Instructions (Signed)
Please arrived at the [facility] cone admitting department at 8 AM tomorrow to have a vascular study completed.  When you check in there you tell them that you have a vascular study ordered.  This study is to determine if you have any blood clots in your legs that might be causing your leg swelling.  Please followup with Dr. Lovell Sheehan this week for reevaluation as well.  Peripheral Edema You have swelling in your legs (peripheral edema). This swelling is due to excess accumulation of salt and water in your body. Edema may be a sign of heart, kidney or liver disease, or a side effect of a medication. It may also be due to problems in the leg veins. Elevating your legs and using special support stockings may be very helpful, if the cause of the swelling is due to poor venous circulation. Avoid long periods of standing, whatever the cause. Treatment of edema depends on identifying the cause. Chips, pretzels, pickles and other salty foods should be avoided. Restricting salt in your diet is almost always needed. Water pills (diuretics) are often used to remove the excess salt and water from your body via urine. These medicines prevent the kidney from reabsorbing sodium. This increases urine flow. Diuretic treatment may also result in lowering of potassium levels in your body. Potassium supplements may be needed if you have to use diuretics daily. Daily weights can help you keep track of your progress in clearing your edema. You should call your caregiver for follow up care as recommended. SEEK IMMEDIATE MEDICAL CARE IF:   You have increased swelling, pain, redness, or heat in your legs.   You develop shortness of breath, especially when lying down.   You develop chest or abdominal pain, weakness, or fainting.   You have a fever.  Document Released: 04/25/2004 Document Revised: 03/07/2011 Document Reviewed: 04/05/2009 [facility] Patient Information 2012 [place], [place].

## 2011-09-23 NOTE — ED Notes (Signed)
Pt. Reports pain "thightness from under her arm pits moving towards her chest x 4 days". "I have been short of breath since Thursday. I thought it was [place] I was singing and could only due 2-3 words and then I had to stop.  And I am having problems with swelling. I increased my lasix. I took an extra pill last night and today, but I am not urinating a lot". Swelling in ankles and feet bilaterally. A.O. X 4 NAD.

## 2011-09-24 ENCOUNTER — Ambulatory Visit (HOSPITAL_COMMUNITY)
Admission: RE | Admit: 2011-09-24 | Discharge: 2011-09-24 | Disposition: A | Discharge status: Home / Self Care | Payer: BC Managed Care – PPO | Source: Ambulatory Visit | Attending: Emergency Medicine | Admitting: Emergency Medicine

## 2011-09-24 DIAGNOSIS — R0602 Shortness of breath: Secondary | ICD-10-CM | POA: Insufficient documentation

## 2011-09-24 DIAGNOSIS — M7989 Other specified soft tissue disorders: Secondary | ICD-10-CM | POA: Insufficient documentation

## 2011-09-24 NOTE — Progress Notes (Signed)
VASCULAR LAB PRELIMINARY  PRELIMINARY  PRELIMINARY  PRELIMINARY  Bilateral lower extremity venous duplex  completed.    Preliminary report:  Bilateral:  No evidence of DVT, superficial thrombosis, or Baker's Cyst.    Cassandria Drew, RVT 09/24/2011, 9:18 AM

## 2011-09-26 ENCOUNTER — Encounter: Payer: Self-pay | Admitting: Family

## 2011-09-26 ENCOUNTER — Ambulatory Visit (INDEPENDENT_AMBULATORY_CARE_PROVIDER_SITE_OTHER): Payer: BC Managed Care – PPO | Admitting: Family

## 2011-09-26 VITALS — BP 138/74 | Temp 97.9°F | Wt 232.0 lb

## 2011-09-26 DIAGNOSIS — R609 Edema, unspecified: Secondary | ICD-10-CM

## 2011-09-26 DIAGNOSIS — E039 Hypothyroidism, unspecified: Secondary | ICD-10-CM

## 2011-09-26 DIAGNOSIS — I10 Essential (primary) hypertension: Secondary | ICD-10-CM

## 2011-09-26 LAB — TSH: TSH: 0.29 u[IU]/mL — ABNORMAL LOW (ref 0.35–5.50)

## 2011-09-26 NOTE — Patient Instructions (Signed)
2 Gram Low Sodium Diet     A 2 gram sodium diet restricts the amount of sodium in the diet to no more than 2 g or 2000 mg daily. Limiting the amount of sodium is often used to help lower blood pressure. It is important if you have heart, liver, or kidney problems. Many foods contain sodium for flavor and sometimes as a preservative. When the amount of sodium in a diet needs to be low, it is important to know what to look for when choosing foods and drinks. The following includes some information and guidelines to help make it easier for you to adapt to a low sodium diet.     QUICK TIPS     Do not add salt to food.   Avoid convenience items and fast food.   Choose unsalted snack foods.   Buy lower sodium products, often labeled as "lower sodium" or "no salt added."   Check food labels to learn how much sodium is in 1 serving.   When eating at a restaurant, ask that your food be prepared with less salt or none, if possible.    READING FOOD LABELS FOR SODIUM INFORMATION     The nutrition facts label is a good place to find how much sodium is in foods. Look for products with no more than 500 to 600 mg of sodium per meal and no more than 150 mg per serving.   Remember that 2 g = 2000 mg.   The food label may also list foods as:   Sodium-free: Less than 5 mg in a serving.   Very low sodium: 35 mg or less in a serving.   Low-sodium: 140 mg or less in a serving.   Light in sodium: 50% less sodium in a serving. For example, if a food that usually has 300 mg of sodium is changed to become light in sodium, it will have 150 mg of sodium.   Reduced sodium: 25% less sodium in a serving. For example, if a food that usually has 400 mg of sodium is changed to reduced sodium, it will have 300 mg of sodium.    CHOOSING FOODS     Grains     Avoid: Salted crackers and snack items. Some cereals, including instant hot cereals. Bread stuffing and biscuit mixes. Seasoned rice or pasta mixes.    Choose: Unsalted snack items. Low-sodium cereals, oats, puffed wheat and rice, shredded wheat. English muffins and bread. Pasta.  Meats     Avoid: Salted, canned, smoked, spiced, pickled meats, including fish and poultry. Bacon, ham, sausage, cold cuts, hot dogs, anchovies.   Choose: Low-sodium canned tuna and salmon. Fresh or frozen meat, poultry, and fish.  Dairy   Avoid: Processed cheese and spreads. Cottage cheese. Buttermilk and condensed milk. Regular cheese.   Choose: Milk. Low-sodium cottage cheese. Yogurt. Sour cream. Low-sodium cheese.  Fruits and Vegetables     Avoid: Regular canned vegetables. Regular canned tomato sauce and paste. Frozen vegetables in sauces. Olives. Pickles. Relishes. Sauerkraut.   Choose: Low-sodium canned vegetables. Low-sodium tomato sauce and paste. Frozen or fresh vegetables. Fresh and frozen fruit.  Condiments     Avoid: Canned and packaged gravies. Worcestershire sauce. Tartar sauce. Barbecue sauce. Soy sauce. Steak sauce. Ketchup. Onion, garlic, and table salt. Meat flavorings and tenderizers.   Choose: Fresh and dried herbs and spices. Low-sodium varieties of mustard and ketchup. Lemon juice. Tabasco sauce. Horseradish.    SAMPLE: 2 GRAM SODIUM MEAL PLAN       Breakfast / Sodium (mg)   1 cup low-fat milk / 143 mg   2 slices whole-wheat toast / 270 mg   1 tbs heart-healthy margarine / 153 mg   1 hard-boiled egg / 139 mg   1 small orange / 0 mg    Lunch / Sodium (mg)   1 cup raw carrots / 76 mg    cup hummus / 298 mg   1 cup low-fat milk / 143 mg    cup red grapes / 2 mg   1 whole-wheat pita bread / 356 mg    Dinner / Sodium (mg)   1 cup whole-wheat pasta / 2 mg   1 cup low-sodium tomato sauce / 73 mg   3 oz lean ground beef / 57 mg   1 small side salad (1 cup raw spinach leaves,  cup cucumber,  cup yellow bell pepper) with 1 tsp olive oil and 1 tsp red wine vinegar / 25 mg    Snack / Sodium (mg)   1 container low-fat vanilla yogurt / 107 mg    3 graham cracker squares / 127 mg    Nutrient Analysis   Calories: 2033   Protein: 77 g   Carbohydrate: 282 g   Fat: 72 g   Sodium: 1971 mg      Document Released: 03/18/2005 Document Revised: 03/07/2011 Document Reviewed: 06/19/2009   [employer] Patient Information 2012 [employer].

## 2011-09-26 NOTE — Progress Notes (Signed)
Subjective:    Patient ID: Marissa Roberts, female    DOB: [birth year], 66 y.o.   MRN: [id number]  HPI 66 year old [place] American female, nonsmoker, patient of Dr. Lovell Sheehan is in today as a hospital followup. She called in to call a nurse on 09/23/2011 with complaints of bilateral edema, shortness of breath, leg pain. She was advised to go to the emergency department. The emergency department drew labs, the skin of her bladder, EKG, chest x-ray, and lower extremities Doppler study that were all negative. She currently takes Lasix 40 mg twice a day. Today, she is much better than she had been. Patient reports an increase in her sodium intake that she had been out of town eating more pork and bacon and she typically consumes.   Review of Systems  Constitutional: Negative.   Respiratory: Negative.  Negative for shortness of breath.   Cardiovascular: Positive for leg swelling. Negative for chest pain and palpitations.       Continues to have mild edema to the right lower extremity.  Gastrointestinal: Negative.   Genitourinary: Negative.   Musculoskeletal: Negative.   Hematological: Negative.   Psychiatric/Behavioral: Negative.    Past Medical History  Diagnosis Date  . Hypertension   . Thyroid disease   . Diabetes mellitus   . CHF (congestive heart failure)     History   Social History  . Marital Status: Married    Spouse Name: N/A    Number of Children: N/A  . Years of Education: N/A   Occupational History  . Not on file.   Social History Main Topics  . Smoking status: Former Smoker    Quit date: 04/01/1990  . Smokeless tobacco: Not on file  . Alcohol Use: No  . Drug Use: No  . Sexually Active: Not on file   Other Topics Concern  . Not on file   Social History Narrative  . No narrative on file    Past Surgical History  Procedure Date  . Knee arhtroscopy   . Abdominal hysterectomy   . Carpel tunnel   . Foot surgery     Family History  Problem Relation Age  of Onset  . Adopted: Yes  . Coronary artery disease    . Diabetes Mother   . Heart disease Mother   . Hyperlipidemia Mother     Allergies  Allergen Reactions  . Contrast Media (Iodinated Diagnostic Agents)     Throat swells from ct con tra  . Erythromycin     REACTION: rash  . Penicillins     REACTION: rash    Current Outpatient Prescriptions on File Prior to Visit  Medication Sig Dispense Refill  . amLODipine-olmesartan (AZOR) 5-40 MG per tablet Take 1 tablet by mouth daily.      . Calcium Carbonate (CALCIUM 600) 1500 MG TABS Take by mouth 2 (two) times daily.        Marland Kitchen ezetimibe-simvastatin (VYTORIN) 10-20 MG per tablet Take 0.5 tablets by mouth at bedtime. 1/2 tab daily       . FREESTYLE LITE test strip USE TWICE DAILY TO TEST BLOOD SUGAR  100 each  4  . furosemide (LASIX) 40 MG tablet Take 80 mg by mouth 2 (two) times daily.      Marland Kitchen glucosamine-chondroitin 500-400 MG tablet Take 1 tablet by mouth daily.        Marland Kitchen HYDROcodone-acetaminophen (VICODIN) 5-500 MG per tablet Take 1 tablet by mouth every 6 (six) hours as needed. For back pain      .  insulin glargine (LANTUS) 100 UNIT/ML injection Inject 50 Units into the skin at bedtime.      . Insulin Pen Needle (B-D ULTRAFINE III SHORT PEN) 31G X 8 MM MISC by Does not apply route. To use with lantus        . levothyroxine (SYNTHROID) 150 MCG tablet Take 150 mcg by mouth daily. Per [facility] pharmacist- this is name brand ,but labeled as generic-will come as l-thyroxine, but will be synthroid      . metFORMIN (GLUCOPHAGE) 850 MG tablet Take 850 mg by mouth 2 (two) times daily with a meal.      . omeprazole (PRILOSEC) 20 MG capsule Take 40 mg by mouth daily.      . potassium chloride (K-DUR,KLOR-CON) 10 MEQ tablet Take 10 mEq by mouth daily.      . vitamin B-12 (CYANOCOBALAMIN) 100 MCG tablet Take 100 mcg by mouth daily.        BP 138/74  Temp 97.9 F (36.6 C) (Oral)  Wt 232 lb (105.235 kg)chart    Objective:   Physical Exam    Constitutional: She is oriented to person, place, and time. She appears well-developed and well-nourished.  Neck: Normal range of motion. Neck supple.  Cardiovascular: Normal rate, regular rhythm and normal heart sounds.   Pulmonary/Chest: Effort normal and breath sounds normal.  Abdominal: Soft. Bowel sounds are normal.  Musculoskeletal: Normal range of motion. She exhibits edema.       Mild 1+ edema noted to the right lower extremity. Left leg has returned to normal.  Neurological: She is alert and oriented to person, place, and time.  Skin: Skin is warm and dry.  Psychiatric: She has a normal mood and affect.          Assessment & Plan:  Assessment: Peripheral edema, hypothyroidism  Plan: We'll obtain a TSH and notify patient pending results. Low-sodium diet. Information given on low sodium diet. Elevate her feet. Patient has a recheck of her PCP in August. Advise her to keep that appointment. Call the office if symptoms worsen, do not resolve and when necessary.

## 2011-09-27 ENCOUNTER — Other Ambulatory Visit: Payer: Self-pay | Admitting: Family

## 2011-09-27 MED ORDER — LEVOTHYROXINE SODIUM 137 MCG PO TABS: 137.0000 ug | ORAL_TABLET | Freq: Every day | ORAL | Status: DC

## 2011-11-06 ENCOUNTER — Encounter: Payer: Self-pay | Admitting: Internal Medicine

## 2011-11-06 ENCOUNTER — Ambulatory Visit (INDEPENDENT_AMBULATORY_CARE_PROVIDER_SITE_OTHER): Payer: BC Managed Care – PPO | Admitting: Internal Medicine

## 2011-11-06 VITALS — BP 140/80 | HR 72 | Temp 98.6°F | Resp 16 | Ht 65.0 in | Wt 230.0 lb

## 2011-11-06 DIAGNOSIS — IMO0002 Reserved for concepts with insufficient information to code with codable children: Secondary | ICD-10-CM

## 2011-11-06 DIAGNOSIS — E785 Hyperlipidemia, unspecified: Secondary | ICD-10-CM

## 2011-11-06 DIAGNOSIS — E038 Other specified hypothyroidism: Secondary | ICD-10-CM

## 2011-11-06 DIAGNOSIS — E1169 Type 2 diabetes mellitus with other specified complication: Secondary | ICD-10-CM

## 2011-11-06 DIAGNOSIS — E1165 Type 2 diabetes mellitus with hyperglycemia: Secondary | ICD-10-CM

## 2011-11-06 LAB — BASIC METABOLIC PANEL
Calcium: 9.3 mg/dL (ref 8.4–10.5)
Chloride: 104 mEq/L (ref 96–112)
Creatinine, Ser: 0.6 mg/dL (ref 0.4–1.2)
Sodium: 142 mEq/L (ref 135–145)

## 2011-11-06 LAB — LIPID PANEL
Cholesterol: 188 mg/dL (ref 0–200)
Triglycerides: 130 mg/dL (ref 0.0–149.0)

## 2011-11-06 NOTE — Progress Notes (Signed)
Subjective:    Patient ID: Marissa Roberts, female    DOB: Jun 03, 1945, 66 y.o.   MRN: [id number]  HPI  Swelling resolved Has not lost weight  No exercising Compliant with medications Reviewed last ov and labs   Review of Systems  Constitutional: Negative for activity change, appetite change and fatigue.  HENT: Negative for ear pain, congestion, neck pain, postnasal drip and sinus pressure.   Eyes: Negative for redness and visual disturbance.  Respiratory: Negative for cough, shortness of breath and wheezing.   Gastrointestinal: Negative for abdominal pain and abdominal distention.  Genitourinary: Negative for dysuria, frequency and menstrual problem.  Musculoskeletal: Negative for myalgias, joint swelling and arthralgias.  Skin: Negative for rash and wound.  Neurological: Negative for dizziness, weakness and headaches.  Hematological: Negative for adenopathy. Does not bruise/bleed easily.  Psychiatric/Behavioral: Negative for disturbed wake/sleep cycle and decreased concentration.   Past Medical History  Diagnosis Date  . Hypertension   . Thyroid disease   . Diabetes mellitus   . CHF (congestive heart failure)     History   Social History  . Marital Status: Married    Spouse Name: N/A    Number of Children: N/A  . Years of Education: N/A   Occupational History  . Not on file.   Social History Main Topics  . Smoking status: Former Smoker    Quit date: 04/01/1990  . Smokeless tobacco: Not on file  . Alcohol Use: No  . Drug Use: No  . Sexually Active: Not on file   Other Topics Concern  . Not on file   Social History Narrative  . No narrative on file    Past Surgical History  Procedure Date  . Knee arhtroscopy   . Abdominal hysterectomy   . Carpel tunnel   . Foot surgery     Family History  Problem Relation Age of Onset  . Adopted: Yes  . Coronary artery disease    . Diabetes Mother   . Heart disease Mother   . Hyperlipidemia Mother      Allergies  Allergen Reactions  . Contrast Media (Iodinated Diagnostic Agents)     Throat swells from ct con tra  . Erythromycin     REACTION: rash  . Penicillins     REACTION: rash    Current Outpatient Prescriptions on File Prior to Visit  Medication Sig Dispense Refill  . amLODipine-olmesartan (AZOR) 5-40 MG per tablet Take 1 tablet by mouth daily.      . Calcium Carbonate (CALCIUM 600) 1500 MG TABS Take by mouth 2 (two) times daily.        Marland Kitchen ezetimibe-simvastatin (VYTORIN) 10-20 MG per tablet Take 0.5 tablets by mouth at bedtime. 1/2 tab daily       . FREESTYLE LITE test strip USE TWICE DAILY TO TEST BLOOD SUGAR  100 each  4  . furosemide (LASIX) 40 MG tablet Take 80 mg by mouth 2 (two) times daily.      Marland Kitchen glucosamine-chondroitin 500-400 MG tablet Take 1 tablet by mouth daily.        Marland Kitchen HYDROcodone-acetaminophen (VICODIN) 5-500 MG per tablet Take 1 tablet by mouth every 6 (six) hours as needed. For back pain      . insulin glargine (LANTUS) 100 UNIT/ML injection Inject 50 Units into the skin at bedtime.      . Insulin Pen Needle (B-D ULTRAFINE III SHORT PEN) 31G X 8 MM MISC by Does not apply route. To use with lantus        .  levothyroxine (SYNTHROID) 137 MCG tablet Take 1 tablet (137 mcg total) by mouth daily.  30 tablet  3  . metFORMIN (GLUCOPHAGE) 850 MG tablet Take 850 mg by mouth 2 (two) times daily with a meal.      . omeprazole (PRILOSEC) 20 MG capsule Take 40 mg by mouth daily.      . potassium chloride (K-DUR,KLOR-CON) 10 MEQ tablet Take 10 mEq by mouth daily.      . vitamin B-12 (CYANOCOBALAMIN) 100 MCG tablet Take 100 mcg by mouth daily.        BP 140/80  Pulse 72  Temp 98.6 F (37 C)  Resp 16  Ht 5\' 5"  (1.651 m)  Wt 230 lb (104.327 kg)  BMI 38.27 kg/m2        Objective:   Physical Exam  Nursing note and vitals reviewed. Constitutional: She is oriented to person, place, and time. She appears well-developed and well-nourished. No distress.  HENT:   Head: Normocephalic and atraumatic.  Right Ear: External ear normal.  Left Ear: External ear normal.  Nose: Nose normal.  Mouth/Throat: Oropharynx is clear and moist.  Eyes: Conjunctivae and EOM are normal. Pupils are equal, round, and reactive to light.  Neck: Normal range of motion. Neck supple. No JVD present. No tracheal deviation present. No thyromegaly present.  Cardiovascular: Normal rate and regular rhythm.   Murmur heard. Pulmonary/Chest: Effort normal and breath sounds normal. She has no wheezes. She exhibits no tenderness.  Abdominal: Soft. Bowel sounds are normal.  Musculoskeletal: Normal range of motion. She exhibits no edema and no tenderness.  Lymphadenopathy:    She has no cervical adenopathy.  Neurological: She is alert and oriented to person, place, and time. She has normal reflexes. No cranial nerve deficit.  Skin: Skin is warm and dry. She is not diaphoretic.  Psychiatric: She has a normal mood and affect. Her behavior is normal.          Assessment & Plan:  The patients diet has worsened and she has stopped exercising monitoring of DM and A1c Reports CBG's in the 140 range but noted that when eats late the cbg's are inreased The recent episode of swelling was related to eating bacon every morning with a trip. Reviewed salt Monitoring tsh on generic synthroid.

## 2011-11-06 NOTE — Patient Instructions (Signed)
The patient is instructed to continue all medications as prescribed. Schedule followup with check out clerk upon leaving the clinic  

## 2012-01-23 ENCOUNTER — Other Ambulatory Visit: Payer: Self-pay | Admitting: Family

## 2012-02-05 ENCOUNTER — Telehealth: Payer: Self-pay | Admitting: Internal Medicine

## 2012-02-05 NOTE — Telephone Encounter (Signed)
Caller: Deionna/Patient; Patient Name: Marissa Roberts; PCP: Darryll Capers (Adults only); Best Callback Phone Number: [phone]. Caller reports cramping in her legs-thighs and calves. Symptoms only at night for past week. Caller reports she takes fluid pills and K+. Recent Cortisone shot about 2 weeks ago. Per Muscle Pain Protocol, Generalized muscle pain, spasm or weakness AND currently on lipid-lowering medication (on Vytorin), See Provider within 24 hours. Caller declined appointment for today, but is agreeable to Thurs appointment. Scheduled for Thurs 11/7 at 10:45 with Adline Mango, FNP. Caller is agreeable.

## 2012-02-06 ENCOUNTER — Ambulatory Visit (INDEPENDENT_AMBULATORY_CARE_PROVIDER_SITE_OTHER): Payer: BC Managed Care – PPO | Admitting: Family

## 2012-02-06 ENCOUNTER — Encounter: Payer: Self-pay | Admitting: Family

## 2012-02-06 VITALS — BP 140/82 | HR 94 | Temp 98.1°F | Wt 236.0 lb

## 2012-02-06 DIAGNOSIS — I10 Essential (primary) hypertension: Secondary | ICD-10-CM

## 2012-02-06 DIAGNOSIS — E785 Hyperlipidemia, unspecified: Secondary | ICD-10-CM

## 2012-02-06 DIAGNOSIS — D510 Vitamin B12 deficiency anemia due to intrinsic factor deficiency: Secondary | ICD-10-CM

## 2012-02-06 DIAGNOSIS — R5383 Other fatigue: Secondary | ICD-10-CM

## 2012-02-06 DIAGNOSIS — E119 Type 2 diabetes mellitus without complications: Secondary | ICD-10-CM

## 2012-02-06 DIAGNOSIS — R5381 Other malaise: Secondary | ICD-10-CM

## 2012-02-06 LAB — CBC
HCT: 39.2 % (ref 36.0–46.0)
Hemoglobin: 12.6 g/dL (ref 12.0–15.0)
MCHC: 32.3 g/dL (ref 30.0–36.0)
MCV: 83.5 fl (ref 78.0–100.0)
Platelets: 300 10*3/uL (ref 150.0–400.0)
RBC: 4.69 Mil/uL (ref 3.87–5.11)
RDW: 14.9 % — ABNORMAL HIGH (ref 11.5–14.6)
WBC: 14.7 10*3/uL — ABNORMAL HIGH (ref 4.5–10.5)

## 2012-02-06 LAB — COMPREHENSIVE METABOLIC PANEL
Albumin: 3.8 g/dL (ref 3.5–5.2)
CO2: 26 mEq/L (ref 19–32)
Calcium: 9.2 mg/dL (ref 8.4–10.5)
GFR: 119.98 mL/min (ref >60.00)
Glucose, Bld: 94 mg/dL (ref 70–99)
Potassium: 3.5 mEq/L (ref 3.5–5.1)
Sodium: 139 mEq/L (ref 135–145)
Total Protein: 7.4 g/dL (ref 6.0–8.3)

## 2012-02-06 LAB — HEMOGLOBIN A1C: Hgb A1c MFr Bld: 8.6 % — ABNORMAL HIGH (ref 4.6–6.5)

## 2012-02-06 LAB — LIPID PANEL
Cholesterol: 217 mg/dL — ABNORMAL HIGH (ref 0–200)
HDL: 62.7 mg/dL
Total CHOL/HDL Ratio: 3
Triglycerides: 121 mg/dL (ref 0.0–149.0)
VLDL: 24.2 mg/dL (ref 0.0–40.0)

## 2012-02-06 LAB — TSH: TSH: 0.65 u[IU]/mL (ref 0.35–5.50)

## 2012-02-06 LAB — VITAMIN B12: Vitamin B-12: >1500 pg/mL — ABNORMAL HIGH (ref 211–911)

## 2012-02-06 NOTE — Patient Instructions (Signed)
    Muscle Cramps Muscle cramps are due to sudden involuntary muscle contraction. This means you have no control over the tightening of a muscle (or muscles). Often there are no obvious causes. Muscle cramps may occur with overexertion. They may also occur with chilling of the muscles. An example of a muscle chilling activity is swimming. It is uncommon for cramps to be due to a serious underlying disorder. In most cases, muscle cramps improve (or leave) within minutes. CAUSES   Some common causes are:  Injury.   Infections, especially viral.   Abnormal levels of the salts and ions in your blood (electrolytes). This could happen if you are taking water pills (diuretics).   Blood vessel disease where not enough blood is getting to the muscles (intermittent claudication).  Some uncommon causes are:  Side effects of some medicine (such as lithium).   Alcohol abuse.   Diseases where there is soreness (inflammation) of the muscular system.  HOME CARE INSTRUCTIONS    It may be helpful to massage, stretch, and relax the affected muscle.   Taking a dose of over-the-counter diphenhydramine is helpful for night leg cramps.  SEEK MEDICAL CARE IF:   Cramps are frequent and not relieved with medicine. MAKE SURE YOU:    Understand these instructions.   Will watch your condition.   Will get help right away if you are not doing well or get worse.  Document Released: 09/07/2001 Document Revised: 06/10/2011 Document Reviewed: 03/09/2008 [employer] Patient Information 2013 [employer].    

## 2012-02-06 NOTE — Progress Notes (Signed)
Subjective:    Patient ID: Marissa Roberts, female    DOB: 31-Aug-1945, 66 y.o.   MRN: [id number]  HPI 66 year old African American non smoking female with complaints of leg cramps for the past 2 weeks that have become progressively worst and increased in frequency primarily at night. Pt reports that cramping occurs at night while is is seeping at wakes her out of her sleep. Cramping is from thighs to distal portion of leg. Pt also reports and decrease in energy level and feelings of fatigue.Pt regularly record blood sugar and states that average recording have been in the range of 140-150 with a couple of reading in the 200's.     Review of Systems  HENT: Negative.   Eyes: Negative.   Respiratory: Negative.   Cardiovascular: Negative for leg swelling.  Gastrointestinal: Negative.   Genitourinary: Negative.   Musculoskeletal:       Leg cramping primarily at night  Skin: Negative.   Neurological: Positive for weakness.       Reports decrease in energy level  Hematological: Negative.   Psychiatric/Behavioral: Negative.    Past Medical History  Diagnosis Date  . Hypertension   . Thyroid disease   . Diabetes mellitus   . CHF (congestive heart failure)     History   Social History  . Marital Status: Married    Spouse Name: N/A    Number of Children: N/A  . Years of Education: N/A   Occupational History  . Not on file.   Social History Main Topics  . Smoking status: Former Smoker    Quit date: 04/01/1990  . Smokeless tobacco: Not on file  . Alcohol Use: No  . Drug Use: No  . Sexually Active: Not on file   Other Topics Concern  . Not on file   Social History Narrative  . No narrative on file    Past Surgical History  Procedure Date  . Knee arhtroscopy   . Abdominal hysterectomy   . Carpel tunnel   . Foot surgery     Family History  Problem Relation Age of Onset  . Adopted: Yes  . Coronary artery disease    . Diabetes Mother   . Heart disease Mother   .  Hyperlipidemia Mother     Allergies  Allergen Reactions  . Contrast Media (Iodinated Diagnostic Agents)     Throat swells from ct con tra  . Erythromycin     REACTION: rash  . Penicillins     REACTION: rash    Current Outpatient Prescriptions on File Prior to Visit  Medication Sig Dispense Refill  . amLODipine-olmesartan (AZOR) 5-40 MG per tablet Take 1 tablet by mouth daily.      . Calcium Carbonate (CALCIUM 600) 1500 MG TABS Take by mouth 2 (two) times daily.        Marland Kitchen ezetimibe-simvastatin (VYTORIN) 10-20 MG per tablet Take 0.5 tablets by mouth at bedtime. 1/2 tab daily       . FREESTYLE LITE test strip USE TWICE DAILY TO TEST BLOOD SUGAR  100 each  4  . furosemide (LASIX) 40 MG tablet Take 80 mg by mouth 2 (two) times daily.      Marland Kitchen glucosamine-chondroitin 500-400 MG tablet Take 1 tablet by mouth daily.        . insulin glargine (LANTUS) 100 UNIT/ML injection Inject 50 Units into the skin at bedtime.      . Insulin Pen Needle (B-D ULTRAFINE III SHORT PEN) 31G X  8 MM MISC by Does not apply route. To use with lantus        . levothyroxine (SYNTHROID, LEVOTHROID) 137 MCG tablet TAKE ONE TABLET BY MOUTH EVERY DAY  30 tablet  2  . metFORMIN (GLUCOPHAGE) 850 MG tablet Take 850 mg by mouth 2 (two) times daily with a meal.      . omeprazole (PRILOSEC) 20 MG capsule Take 40 mg by mouth 2 (two) times daily.       . potassium chloride (K-DUR,KLOR-CON) 10 MEQ tablet Take 10 mEq by mouth daily.      . vitamin B-12 (CYANOCOBALAMIN) 100 MCG tablet Take 100 mcg by mouth daily.        BP 140/82  Pulse 94  Temp 98.1 F (36.7 C) (Oral)  Wt 236 lb (107.049 kg)chart      Objective:   Physical Exam  Constitutional: She is oriented to person, place, and time. She appears well-developed and well-nourished.  HENT:  Head: Normocephalic and atraumatic.  Eyes: Pupils are equal, round, and reactive to light.  Neck: Normal range of motion.  Cardiovascular: Normal rate, regular rhythm, normal  heart sounds and intact distal pulses.   Pulmonary/Chest: Effort normal and breath sounds normal.  Abdominal: Soft. Bowel sounds are normal.  Genitourinary:       deferred  Musculoskeletal: Normal range of motion.       Negative homans  Left distal leg tender to palpation  Neurological: She is alert and oriented to person, place, and time.  Skin: Skin is warm and dry.  Psychiatric: She has a normal mood and affect. Her behavior is normal. Judgment and thought content normal.          Assessment & Plan:    Assessment: Type II diabetes, hypertension, fatigue, Pernicious anemia.  Plan:Collect labs- CBC, BMP, TSH, Lipid profile, HA1C, B12 Follow up with patient once results are received.  Patient advised to return if symptoms persist or become worst.

## 2012-03-11 ENCOUNTER — Ambulatory Visit: Payer: BC Managed Care – PPO | Admitting: Internal Medicine

## 2012-03-13 ENCOUNTER — Ambulatory Visit: Payer: BC Managed Care – PPO | Admitting: Internal Medicine

## 2012-03-31 ENCOUNTER — Telehealth: Payer: Self-pay | Admitting: Internal Medicine

## 2012-03-31 NOTE — Telephone Encounter (Signed)
Pt is requesting samples of azor 5-40mg . If no samples are avail patient decline rx sent to pharm

## 2012-03-31 NOTE — Telephone Encounter (Signed)
Pt informed-samples up front

## 2012-04-20 ENCOUNTER — Other Ambulatory Visit: Payer: Self-pay | Admitting: Orthopedic Surgery

## 2012-04-20 DIAGNOSIS — M25511 Pain in right shoulder: Secondary | ICD-10-CM

## 2012-04-20 DIAGNOSIS — R531 Weakness: Secondary | ICD-10-CM

## 2012-04-22 ENCOUNTER — Other Ambulatory Visit: Payer: Self-pay | Admitting: Internal Medicine

## 2012-04-23 ENCOUNTER — Other Ambulatory Visit: Payer: Self-pay | Admitting: Internal Medicine

## 2012-04-27 ENCOUNTER — Ambulatory Visit
Admission: RE | Admit: 2012-04-27 | Discharge: 2012-04-27 | Disposition: A | Discharge status: Home / Self Care | Payer: BC Managed Care – PPO | Source: Ambulatory Visit | Attending: Orthopedic Surgery | Admitting: Orthopedic Surgery

## 2012-04-27 DIAGNOSIS — M25511 Pain in right shoulder: Secondary | ICD-10-CM

## 2012-04-27 DIAGNOSIS — R531 Weakness: Secondary | ICD-10-CM

## 2012-05-04 ENCOUNTER — Other Ambulatory Visit: Payer: Self-pay | Admitting: Internal Medicine

## 2012-05-07 ENCOUNTER — Other Ambulatory Visit: Payer: Self-pay | Admitting: Orthopedic Surgery

## 2012-05-15 ENCOUNTER — Ambulatory Visit: Payer: Self-pay | Admitting: Family

## 2012-05-18 ENCOUNTER — Encounter: Payer: Self-pay | Admitting: Family

## 2012-05-18 ENCOUNTER — Ambulatory Visit (INDEPENDENT_AMBULATORY_CARE_PROVIDER_SITE_OTHER): Payer: BC Managed Care – PPO | Admitting: Family

## 2012-05-18 ENCOUNTER — Encounter (HOSPITAL_COMMUNITY): Payer: Self-pay | Admitting: Pharmacy Technician

## 2012-05-18 VITALS — BP 168/80 | HR 82 | Wt 237.0 lb

## 2012-05-18 DIAGNOSIS — E7800 Pure hypercholesterolemia, unspecified: Secondary | ICD-10-CM

## 2012-05-18 DIAGNOSIS — IMO0001 Reserved for inherently not codable concepts without codable children: Secondary | ICD-10-CM

## 2012-05-18 DIAGNOSIS — D510 Vitamin B12 deficiency anemia due to intrinsic factor deficiency: Secondary | ICD-10-CM

## 2012-05-18 DIAGNOSIS — I10 Essential (primary) hypertension: Secondary | ICD-10-CM

## 2012-05-18 LAB — CBC WITH DIFFERENTIAL/PLATELET
Basophils Absolute: 0.1 10*3/uL (ref 0.0–0.1)
Eosinophils Absolute: 0.3 10*3/uL (ref 0.0–0.7)
Hemoglobin: 12.6 g/dL (ref 12.0–15.0)
Lymphocytes Relative: 41.5 % (ref 12.0–46.0)
MCHC: 32.9 g/dL (ref 30.0–36.0)
Monocytes Relative: 7.7 % (ref 3.0–12.0)
Neutrophils Relative %: 48 % (ref 43.0–77.0)
Platelets: 293 10*3/uL (ref 150.0–400.0)
RDW: 15.1 % — ABNORMAL HIGH (ref 11.5–14.6)

## 2012-05-18 LAB — TSH: TSH: 0.71 u[IU]/mL (ref 0.35–5.50)

## 2012-05-18 LAB — VITAMIN B12: Vitamin B-12: 815 pg/mL (ref 211–911)

## 2012-05-18 LAB — LIPID PANEL
Cholesterol: 178 mg/dL (ref 0–200)
LDL Cholesterol: 97 mg/dL (ref 0–99)
Total CHOL/HDL Ratio: 3
Triglycerides: 139 mg/dL (ref 0.0–149.0)
VLDL: 27.8 mg/dL (ref 0.0–40.0)

## 2012-05-18 NOTE — Patient Instructions (Signed)
Increase Lantus by 2 units every 3 days for a blood sugar greater than 120. Start at 57 units.   Diabetes and Exercise Regular exercise is important and can help:   Control blood glucose (sugar).  Decrease blood pressure.    Control blood lipids (cholesterol, triglycerides).  Improve overall health. BENEFITS FROM EXERCISE  Improved fitness.  Improved flexibility.  Improved endurance.  Increased bone density.  Weight control.  Increased muscle strength.  Decreased body fat.  Improvement of the body's use of insulin, a hormone.  Increased insulin sensitivity.  Reduction of insulin needs.  Reduced stress and tension.  Helps you feel better. People with diabetes who add exercise to their lifestyle gain additional benefits, including:  Weight loss.  Reduced appetite.  Improvement of the body's use of blood glucose.  Decreased risk factors for heart disease:  Lowering of cholesterol and triglycerides.  Raising the level of good cholesterol (high-density lipoproteins, HDL).  Lowering blood sugar.  Decreased blood pressure. TYPE 1 DIABETES AND EXERCISE  Exercise will usually lower your blood glucose.  If blood glucose is greater than 240 mg/dl, check urine ketones. If ketones are present, do not exercise.  Location of the insulin injection sites may need to be adjusted with exercise. Avoid injecting insulin into areas of the body that will be exercised. For example, avoid injecting insulin into:  The arms when playing tennis.  The legs when jogging. For more information, discuss this with your caregiver.  Keep a record of:  Food intake.  Type and amount of exercise.  Expected peak times of insulin action.  Blood glucose levels. Do this before, during, and after exercise. Review your records with your caregiver. This will help you to develop guidelines for adjusting food intake and insulin amounts.  TYPE 2 DIABETES AND EXERCISE  Regular physical  activity can help control blood glucose.  Exercise is important because it may:  Increase the body's sensitivity to insulin.  Improve blood glucose control.  Exercise reduces the risk of heart disease. It decreases serum cholesterol and triglycerides. It also lowers blood pressure.  Those who take insulin or oral hypoglycemic agents should watch for signs of hypoglycemia. These signs include dizziness, shaking, sweating, chills, and confusion.  Body water is lost during exercise. It must be replaced. This will help to avoid loss of body fluids (dehydration) or heat stroke. Be sure to talk to your caregiver before starting an exercise program to make sure it is safe for you. Remember, any activity is better than none.  Document Released: 06/08/2003 Document Revised: 06/10/2011 Document Reviewed: 09/22/2008 [facility] Patient Information 2013 [place], [place].

## 2012-05-18 NOTE — Progress Notes (Signed)
Subjective:    Patient ID: Marissa Roberts, female    DOB: 09/09/1945, 67 y.o.   MRN: [id number]  HPI 67 year old AAF, nonsmoker, is in for a recheck of GERd, HTN, DM-uncontrolled, hyperlipidemia, and pericious anemia. Reports fasting blood sugars in the low 200s lately. She has stopped taking Byetta due to cost concerns. She is requesting samples of Azor and Vytorin. She has not taken any of her medications today.  Is not exercising. Reports having low energy. Patient is scheduled to have surgery on her right rotator cuff this week.    Review of Systems  Constitutional: Negative.   HENT: Negative.   Respiratory: Negative.   Cardiovascular: Negative.   Gastrointestinal: Negative.   Endocrine: Negative for polydipsia and polyphagia.  Genitourinary: Negative.   Skin: Negative.   Neurological: Negative.   Hematological: Negative.   Psychiatric/Behavioral: Negative.    Past Medical History  Diagnosis Date  . Hypertension   . Thyroid disease   . Diabetes mellitus   . CHF (congestive heart failure)     History   Social History  . Marital Status: Married    Spouse Name: N/A    Number of Children: N/A  . Years of Education: N/A   Occupational History  . Not on file.   Social History Main Topics  . Smoking status: Former Smoker    Quit date: 04/01/1990  . Smokeless tobacco: Not on file  . Alcohol Use: No  . Drug Use: No  . Sexually Active: Not on file   Other Topics Concern  . Not on file   Social History Narrative  . No narrative on file    Past Surgical History  Procedure Laterality Date  . Knee arhtroscopy    . Abdominal hysterectomy    . Carpel tunnel    . Foot surgery      Family History  Problem Relation Age of Onset  . Adopted: Yes  . Coronary artery disease    . Diabetes Mother   . Heart disease Mother   . Hyperlipidemia Mother     Allergies  Allergen Reactions  . Contrast Media (Iodinated Diagnostic Agents)     Throat swells from ct  con tra  . Erythromycin     REACTION: rash  . Penicillins     REACTION: rash    Current Outpatient Prescriptions on File Prior to Visit  Medication Sig Dispense Refill  . amLODipine-olmesartan (AZOR) 5-40 MG per tablet Take 1 tablet by mouth daily.      . Calcium Carbonate (CALCIUM 600) 1500 MG TABS Take 2 tablets by mouth daily.       Marland Kitchen ezetimibe-simvastatin (VYTORIN) 10-20 MG per tablet Take 0.5 tablets by mouth at bedtime. 1/2 tab daily       . FREESTYLE LITE test strip USE TWICE DAILY TO TEST BLOOD SUGAR  100 each  4  . furosemide (LASIX) 40 MG tablet Take 80 mg by mouth 2 (two) times daily.      Marland Kitchen glucosamine-chondroitin 500-400 MG tablet Take 1 tablet by mouth daily.        . insulin glargine (LANTUS) 100 UNIT/ML injection Inject 55 Units into the skin at bedtime.       . Insulin Pen Needle (B-D ULTRAFINE III SHORT PEN) 31G X 8 MM MISC by Does not apply route. To use with lantus        . levothyroxine (SYNTHROID, LEVOTHROID) 137 MCG tablet Take 137 mcg by mouth daily.      Marland Kitchen  metFORMIN (GLUCOPHAGE) 850 MG tablet Take 850 mg by mouth 2 (two) times daily with a meal.      . omeprazole (PRILOSEC) 20 MG capsule Take 40 mg by mouth daily.       . potassium chloride (K-DUR) 10 MEQ tablet Take 10 mEq by mouth daily.       No current facility-administered medications on file prior to visit.    BP 168/80  Pulse 82  Wt 237 lb (107.502 kg)  BMI 39.44 kg/m2  SpO2 96%chart    Objective:   Physical Exam  Constitutional: She is oriented to person, place, and time. She appears well-developed and well-nourished.  HENT:  Right Ear: External ear normal.  Left Ear: External ear normal.  Nose: Nose normal.  Mouth/Throat: Oropharynx is clear and moist.  Neck: Normal range of motion. Neck supple.  Cardiovascular: Normal rate, regular rhythm and normal heart sounds.   Pulmonary/Chest: Effort normal and breath sounds normal.  Abdominal: Soft. Bowel sounds are normal.  Musculoskeletal:  Normal range of motion.  Neurological: She is alert and oriented to person, place, and time.  Skin: Skin is warm and dry.  Psychiatric: She has a normal mood and affect.          Assessment & Plan:  Assessment;  1. Type 2 Diabetes-uncontrolled 2. Hypertension 3. Pernicious Anemia 4. GERD  Plan: Increase Lantus by 2 units every 3 days until FBS are less than 120. Start at 57 units. Exercise 30 min daily. Take am meds today and as directed daily. Continue current meds.

## 2012-05-19 ENCOUNTER — Encounter (HOSPITAL_COMMUNITY)
Admission: RE | Admit: 2012-05-19 | Discharge: 2012-05-19 | Disposition: A | Discharge status: Home / Self Care | Payer: BC Managed Care – PPO | Source: Ambulatory Visit | Attending: Orthopedic Surgery | Admitting: Orthopedic Surgery

## 2012-05-19 ENCOUNTER — Encounter (HOSPITAL_COMMUNITY): Payer: Self-pay

## 2012-05-19 HISTORY — DX: Pure hypercholesterolemia, unspecified: E78.00

## 2012-05-19 HISTORY — DX: Hypothyroidism, unspecified: E03.9

## 2012-05-19 HISTORY — DX: Unspecified osteoarthritis, unspecified site: M19.90

## 2012-05-19 HISTORY — DX: Pneumonia, unspecified organism: J18.9

## 2012-05-19 HISTORY — DX: Other specified postprocedural states: Z98.890

## 2012-05-19 HISTORY — DX: Nausea with vomiting, unspecified: R11.2

## 2012-05-19 HISTORY — DX: Bronchitis, not specified as acute or chronic: J40

## 2012-05-19 HISTORY — DX: Gastro-esophageal reflux disease without esophagitis: K21.9

## 2012-05-19 LAB — BASIC METABOLIC PANEL
BUN: 12 mg/dL (ref 6–23)
Calcium: 9.6 mg/dL (ref 8.4–10.5)
Chloride: 100 mEq/L (ref 96–112)
Creatinine, Ser: 0.6 mg/dL (ref 0.50–1.10)
GFR calc Af Amer: >90 mL/min (ref >90)

## 2012-05-19 LAB — SURGICAL PCR SCREEN
MRSA, PCR: NEGATIVE
Staphylococcus aureus: NEGATIVE

## 2012-05-19 NOTE — Progress Notes (Signed)
Patient informed Nurse that she had a stress test but was unaware of when or where it took place. Patient denied having a cardiac cath or sleep study. PCP is Dr. Darryll Capers. Patient had CBC with diff on 05/18/12 at PCP office. Encounter in EPIC.

## 2012-05-19 NOTE — Pre-Procedure Instructions (Signed)
[facility]  05/19/2012   Your procedure is scheduled on:  Tuesday May 26, 2012.  Report to [facility] at 10:30 AM.  Call this number if you have problems the morning of surgery: [phone]   Remember:   Do not eat food or drink liquids after midnight.   Take these medicines the morning of surgery with A SIP OF WATER: Amlodipine (Azor), Levothyroxine (Synthroid), Omeprazole (Prilosec)   Do not wear jewelry, make-up or nail polish.  Do not wear lotions, powders, or perfumes.   Do not shave 48 hours prior to surgery.   Do not bring valuables to the hospital.  Contacts, dentures or bridgework may not be worn into surgery.  Leave suitcase in the car. After surgery it may be brought to your room.  For patients admitted to the hospital, checkout time is 11:00 AM the day of discharge.   Patients discharged the day of surgery will not be allowed to drive home.  Name and phone number of your driver: Family/Friend  Special Instructions: Shower using CHG 2 nights before surgery and the night before surgery.  If you shower the day of surgery use CHG.  Use special wash - you have one bottle of CHG for all showers.  You should use approximately 1/3 of the bottle for each shower.   Please read over the following fact sheets that you were given: Pain Booklet, Coughing and Deep Breathing, MRSA Information and Surgical Site Infection Prevention

## 2012-05-25 MED ORDER — CLINDAMYCIN PHOSPHATE 900 MG/50ML IV SOLN
900.0000 mg | INTRAVENOUS | Status: AC
  Administered 2012-05-26: 900 mg via INTRAVENOUS
  Filled 2012-05-25: qty 50

## 2012-05-26 ENCOUNTER — Encounter (HOSPITAL_COMMUNITY)
Admission: RE | Disposition: A | Discharge status: Home / Self Care | Payer: Self-pay | Source: Ambulatory Visit | Attending: Orthopedic Surgery

## 2012-05-26 ENCOUNTER — Encounter (HOSPITAL_COMMUNITY): Payer: Self-pay | Admitting: Anesthesiology

## 2012-05-26 ENCOUNTER — Encounter (HOSPITAL_COMMUNITY): Payer: Self-pay | Admitting: *Deleted

## 2012-05-26 ENCOUNTER — Observation Stay (HOSPITAL_COMMUNITY)
Admission: RE | Admit: 2012-05-26 | Discharge: 2012-05-27 | Disposition: A | Discharge status: Home / Self Care | Payer: BC Managed Care – PPO | Source: Ambulatory Visit | Attending: Orthopedic Surgery | Admitting: Orthopedic Surgery

## 2012-05-26 ENCOUNTER — Ambulatory Visit (HOSPITAL_COMMUNITY): Payer: BC Managed Care – PPO | Admitting: Anesthesiology

## 2012-05-26 DIAGNOSIS — I10 Essential (primary) hypertension: Secondary | ICD-10-CM | POA: Insufficient documentation

## 2012-05-26 DIAGNOSIS — M719 Bursopathy, unspecified: Principal | ICD-10-CM | POA: Insufficient documentation

## 2012-05-26 DIAGNOSIS — E119 Type 2 diabetes mellitus without complications: Secondary | ICD-10-CM | POA: Insufficient documentation

## 2012-05-26 DIAGNOSIS — Z5333 Arthroscopic surgical procedure converted to open procedure: Secondary | ICD-10-CM | POA: Insufficient documentation

## 2012-05-26 DIAGNOSIS — M75101 Unspecified rotator cuff tear or rupture of right shoulder, not specified as traumatic: Secondary | ICD-10-CM

## 2012-05-26 DIAGNOSIS — Z01812 Encounter for preprocedural laboratory examination: Secondary | ICD-10-CM | POA: Insufficient documentation

## 2012-05-26 DIAGNOSIS — M67919 Unspecified disorder of synovium and tendon, unspecified shoulder: Principal | ICD-10-CM | POA: Insufficient documentation

## 2012-05-26 HISTORY — PX: SHOULDER ARTHROSCOPY WITH SUBACROMIAL DECOMPRESSION, ROTATOR CUFF REPAIR AND BICEP TENDON REPAIR: SHX5687

## 2012-05-26 LAB — GLUCOSE, CAPILLARY
Glucose-Capillary: 92 mg/dL (ref 70–99)
Glucose-Capillary: 136 mg/dL — ABNORMAL HIGH (ref 70–99)

## 2012-05-26 SURGERY — SHOULDER ARTHROSCOPY WITH SUBACROMIAL DECOMPRESSION, ROTATOR CUFF REPAIR AND BICEP TENDON REPAIR
Anesthesia: General | Site: Shoulder | Laterality: Right | Wound class: Clean

## 2012-05-26 MED ORDER — ACETAMINOPHEN 325 MG PO TABS: 650.0000 mg | ORAL_TABLET | Freq: Four times a day (QID) | ORAL | Status: DC | PRN

## 2012-05-26 MED ORDER — POTASSIUM CHLORIDE ER 10 MEQ PO TBCR
10.0000 meq | EXTENDED_RELEASE_TABLET | Freq: Every day | ORAL | Status: DC
  Administered 2012-05-27: 10 meq via ORAL
  Filled 2012-05-26 (×2): qty 1

## 2012-05-26 MED ORDER — AMLODIPINE BESYLATE 5 MG PO TABS
5.0000 mg | ORAL_TABLET | Freq: Every day | ORAL | Status: DC
  Administered 2012-05-27: 5 mg via ORAL
  Filled 2012-05-26: qty 1

## 2012-05-26 MED ORDER — CALCIUM CARBONATE 1500 (600 CA) MG PO TABS: 2.0000 | ORAL_TABLET | Freq: Every day | ORAL | Status: DC

## 2012-05-26 MED ORDER — CHLORHEXIDINE GLUCONATE 4 % EX LIQD: 60.0000 mL | Freq: Once | CUTANEOUS | Status: DC

## 2012-05-26 MED ORDER — POTASSIUM CHLORIDE IN NACL 20-0.9 MEQ/L-% IV SOLN
INTRAVENOUS | Status: DC
  Administered 2012-05-26: 21:00:00 via INTRAVENOUS
  Filled 2012-05-26 (×2): qty 1000

## 2012-05-26 MED ORDER — IRBESARTAN 300 MG PO TABS
300.0000 mg | ORAL_TABLET | Freq: Every day | ORAL | Status: DC
  Administered 2012-05-27: 300 mg via ORAL
  Filled 2012-05-26: qty 1

## 2012-05-26 MED ORDER — LEVOTHYROXINE SODIUM 137 MCG PO TABS
137.0000 ug | ORAL_TABLET | Freq: Every day | ORAL | Status: DC
  Administered 2012-05-27: 137 ug via ORAL
  Filled 2012-05-26 (×2): qty 1

## 2012-05-26 MED ORDER — EPINEPHRINE HCL 1 MG/ML IJ SOLN
INTRAMUSCULAR | Status: DC | PRN
  Administered 2012-05-26: .1 mL

## 2012-05-26 MED ORDER — ONDANSETRON HCL 4 MG PO TABS: 4.0000 mg | ORAL_TABLET | Freq: Four times a day (QID) | ORAL | Status: DC | PRN

## 2012-05-26 MED ORDER — INSULIN GLARGINE 100 UNIT/ML ~~LOC~~ SOLN
55.0000 [IU] | Freq: Every day | SUBCUTANEOUS | Status: DC
  Administered 2012-05-26: 55 [IU] via SUBCUTANEOUS

## 2012-05-26 MED ORDER — OXYCODONE HCL 5 MG PO TABS
5.0000 mg | ORAL_TABLET | ORAL | Status: DC | PRN
  Administered 2012-05-26 – 2012-05-27 (×4): 10 mg via ORAL
  Filled 2012-05-26 (×4): qty 2

## 2012-05-26 MED ORDER — HYDROMORPHONE HCL PF 1 MG/ML IJ SOLN
0.5000 mg | INTRAMUSCULAR | Status: DC | PRN
  Administered 2012-05-26 – 2012-05-27 (×2): 1 mg via INTRAVENOUS
  Filled 2012-05-26 (×2): qty 1

## 2012-05-26 MED ORDER — LIDOCAINE HCL (CARDIAC) 20 MG/ML IV SOLN: INTRAVENOUS | Status: DC | PRN

## 2012-05-26 MED ORDER — MIDAZOLAM HCL 2 MG/2ML IJ SOLN
INTRAMUSCULAR | Status: AC
  Administered 2012-05-26: 2 mg via INTRAVENOUS
  Filled 2012-05-26: qty 2

## 2012-05-26 MED ORDER — METOCLOPRAMIDE HCL 5 MG PO TABS
5.0000 mg | ORAL_TABLET | Freq: Three times a day (TID) | ORAL | Status: DC | PRN
  Filled 2012-05-26: qty 2

## 2012-05-26 MED ORDER — GLYCOPYRROLATE 0.2 MG/ML IJ SOLN
INTRAMUSCULAR | Status: DC | PRN
  Administered 2012-05-26: 0.6 mg via INTRAVENOUS

## 2012-05-26 MED ORDER — PANTOPRAZOLE SODIUM 40 MG PO TBEC
40.0000 mg | DELAYED_RELEASE_TABLET | Freq: Every day | ORAL | Status: DC
  Administered 2012-05-26 – 2012-05-27 (×2): 40 mg via ORAL
  Filled 2012-05-26 (×2): qty 1

## 2012-05-26 MED ORDER — LIDOCAINE HCL (CARDIAC) 20 MG/ML IV SOLN
INTRAVENOUS | Status: DC | PRN
  Administered 2012-05-26: 80 mg via INTRAVENOUS

## 2012-05-26 MED ORDER — 0.9 % SODIUM CHLORIDE (POUR BTL) OPTIME
TOPICAL | Status: DC | PRN
  Administered 2012-05-26: 1000 mL

## 2012-05-26 MED ORDER — FENTANYL CITRATE 0.05 MG/ML IJ SOLN
INTRAMUSCULAR | Status: DC | PRN
  Administered 2012-05-26 (×5): 50 ug via INTRAVENOUS

## 2012-05-26 MED ORDER — SODIUM CHLORIDE 0.9 % IR SOLN
Status: DC | PRN
  Administered 2012-05-26: 3000 mL

## 2012-05-26 MED ORDER — PHENOL 1.4 % MT LIQD: 1.0000 | OROMUCOSAL | Status: DC | PRN

## 2012-05-26 MED ORDER — HYDROMORPHONE HCL PF 1 MG/ML IJ SOLN
INTRAMUSCULAR | Status: AC
  Filled 2012-05-26: qty 1

## 2012-05-26 MED ORDER — FENTANYL CITRATE 0.05 MG/ML IJ SOLN
INTRAMUSCULAR | Status: AC
  Administered 2012-05-26: 100 ug via INTRAVENOUS
  Filled 2012-05-26: qty 2

## 2012-05-26 MED ORDER — AMLODIPINE-OLMESARTAN 5-40 MG PO TABS: 1.0000 | ORAL_TABLET | Freq: Every day | ORAL | Status: DC

## 2012-05-26 MED ORDER — ONDANSETRON HCL 4 MG/2ML IJ SOLN
4.0000 mg | Freq: Four times a day (QID) | INTRAMUSCULAR | Status: DC | PRN
  Administered 2012-05-26: 4 mg via INTRAVENOUS
  Filled 2012-05-26: qty 2

## 2012-05-26 MED ORDER — ASPIRIN 325 MG PO TABS
325.0000 mg | ORAL_TABLET | Freq: Every day | ORAL | Status: DC
  Administered 2012-05-26 – 2012-05-27 (×2): 325 mg via ORAL
  Filled 2012-05-26 (×2): qty 1

## 2012-05-26 MED ORDER — MENTHOL 3 MG MT LOZG: 1.0000 | LOZENGE | OROMUCOSAL | Status: DC | PRN

## 2012-05-26 MED ORDER — EZETIMIBE-SIMVASTATIN 10-20 MG PO TABS
0.5000 | ORAL_TABLET | Freq: Every day | ORAL | Status: DC
  Administered 2012-05-26: 0.5 via ORAL
  Filled 2012-05-26 (×2): qty 0.5

## 2012-05-26 MED ORDER — FUROSEMIDE 80 MG PO TABS
80.0000 mg | ORAL_TABLET | Freq: Two times a day (BID) | ORAL | Status: DC
  Administered 2012-05-26 – 2012-05-27 (×2): 80 mg via ORAL
  Filled 2012-05-26 (×4): qty 1

## 2012-05-26 MED ORDER — PHENYLEPHRINE HCL 10 MG/ML IJ SOLN
INTRAMUSCULAR | Status: DC | PRN
  Administered 2012-05-26: 40 ug via INTRAVENOUS
  Administered 2012-05-26: 80 ug via INTRAVENOUS
  Administered 2012-05-26: 40 ug via INTRAVENOUS
  Administered 2012-05-26 (×3): 80 ug via INTRAVENOUS

## 2012-05-26 MED ORDER — BUPIVACAINE-EPINEPHRINE PF 0.5-1:200000 % IJ SOLN
INTRAMUSCULAR | Status: DC | PRN
  Administered 2012-05-26: 25 mL

## 2012-05-26 MED ORDER — NEOSTIGMINE METHYLSULFATE 1 MG/ML IJ SOLN
INTRAMUSCULAR | Status: DC | PRN
  Administered 2012-05-26: 4 mg via INTRAVENOUS

## 2012-05-26 MED ORDER — ONDANSETRON HCL 4 MG/2ML IJ SOLN: 4.0000 mg | Freq: Once | INTRAMUSCULAR | Status: DC | PRN

## 2012-05-26 MED ORDER — METFORMIN HCL 850 MG PO TABS
850.0000 mg | ORAL_TABLET | Freq: Two times a day (BID) | ORAL | Status: DC
  Administered 2012-05-27: 850 mg via ORAL
  Filled 2012-05-26 (×3): qty 1

## 2012-05-26 MED ORDER — ARTIFICIAL TEARS OP OINT
TOPICAL_OINTMENT | OPHTHALMIC | Status: DC | PRN
  Administered 2012-05-26: 1 via OPHTHALMIC

## 2012-05-26 MED ORDER — ONDANSETRON HCL 4 MG/2ML IJ SOLN
INTRAMUSCULAR | Status: DC | PRN
  Administered 2012-05-26: 4 mg via INTRAVENOUS

## 2012-05-26 MED ORDER — INSULIN ASPART 100 UNIT/ML ~~LOC~~ SOLN
0.0000 [IU] | Freq: Three times a day (TID) | SUBCUTANEOUS | Status: DC
  Administered 2012-05-27 (×2): 5 [IU] via SUBCUTANEOUS

## 2012-05-26 MED ORDER — CALCIUM CARBONATE 1250 (500 CA) MG PO TABS
2.0000 | ORAL_TABLET | Freq: Every day | ORAL | Status: DC
Start: 2012-05-27 — End: 2012-05-27
  Administered 2012-05-27: 1000 mg via ORAL
  Filled 2012-05-26 (×2): qty 2

## 2012-05-26 MED ORDER — METHOCARBAMOL 500 MG PO TABS: 500.0000 mg | ORAL_TABLET | Freq: Four times a day (QID) | ORAL | Status: DC | PRN

## 2012-05-26 MED ORDER — FENTANYL CITRATE 0.05 MG/ML IJ SOLN: 50.0000 ug | INTRAMUSCULAR | Status: DC | PRN

## 2012-05-26 MED ORDER — OXYCODONE HCL 5 MG/5ML PO SOLN: 5.0000 mg | Freq: Once | ORAL | Status: DC | PRN

## 2012-05-26 MED ORDER — MIDAZOLAM HCL 2 MG/2ML IJ SOLN: 1.0000 mg | INTRAMUSCULAR | Status: DC | PRN

## 2012-05-26 MED ORDER — ACETAMINOPHEN 650 MG RE SUPP: 650.0000 mg | Freq: Four times a day (QID) | RECTAL | Status: DC | PRN

## 2012-05-26 MED ORDER — METOCLOPRAMIDE HCL 5 MG/ML IJ SOLN: 5.0000 mg | Freq: Three times a day (TID) | INTRAMUSCULAR | Status: DC | PRN

## 2012-05-26 MED ORDER — HYDROMORPHONE HCL PF 1 MG/ML IJ SOLN
0.2500 mg | INTRAMUSCULAR | Status: DC | PRN
  Administered 2012-05-26 (×2): 0.5 mg via INTRAVENOUS

## 2012-05-26 MED ORDER — CLINDAMYCIN PHOSPHATE 600 MG/50ML IV SOLN
600.0000 mg | Freq: Three times a day (TID) | INTRAVENOUS | Status: AC
  Administered 2012-05-26 – 2012-05-27 (×2): 600 mg via INTRAVENOUS
  Filled 2012-05-26 (×2): qty 50

## 2012-05-26 MED ORDER — OXYCODONE HCL 5 MG PO TABS: 5.0000 mg | ORAL_TABLET | Freq: Once | ORAL | Status: DC | PRN

## 2012-05-26 MED ORDER — METHOCARBAMOL 100 MG/ML IJ SOLN: 500.0000 mg | Freq: Four times a day (QID) | INTRAVENOUS | Status: DC | PRN

## 2012-05-26 MED ORDER — LACTATED RINGERS IV SOLN
INTRAVENOUS | Status: DC
  Administered 2012-05-26: 12:00:00 via INTRAVENOUS

## 2012-05-26 MED ORDER — EPINEPHRINE HCL 1 MG/ML IJ SOLN
INTRAMUSCULAR | Status: AC
  Filled 2012-05-26: qty 1

## 2012-05-26 MED ORDER — PROPOFOL 10 MG/ML IV BOLUS
INTRAVENOUS | Status: DC | PRN
  Administered 2012-05-26: 200 mg via INTRAVENOUS

## 2012-05-26 MED ORDER — LACTATED RINGERS IV SOLN
INTRAVENOUS | Status: DC | PRN
  Administered 2012-05-26 (×3): via INTRAVENOUS

## 2012-05-26 MED ORDER — ROCURONIUM BROMIDE 100 MG/10ML IV SOLN
INTRAVENOUS | Status: DC | PRN
  Administered 2012-05-26: 50 mg via INTRAVENOUS

## 2012-05-26 SURGICAL SUPPLY — 79 items
ANCH SUT 2 FT CRKSCW 14.7 STRL (Anchor) ×2 IMPLANT
ANCH SUT PUSHLCK 24X4.5 STRL (Orthopedic Implant) ×2 IMPLANT
ANCHOR CORKSCREW BIO 5.5 FT (Anchor) ×2 IMPLANT
APL SKNCLS STERI-STRIP NONHPOA (GAUZE/BANDAGES/DRESSINGS) ×1
BENZOIN TINCTURE PRP APPL 2/3 (GAUZE/BANDAGES/DRESSINGS) ×2 IMPLANT
BIT DRILL TAK (DRILL) IMPLANT
BLADE CUDA 5.5 (BLADE) IMPLANT
BLADE CUTTER GATOR 3.5 (BLADE) ×2 IMPLANT
BLADE GREAT WHITE 4.2 (BLADE) ×2 IMPLANT
BLADE SURG 11 STRL SS (BLADE) ×2 IMPLANT
BUR GATOR 2.9 (BURR) IMPLANT
BUR OVAL 6.0 (BURR) ×2 IMPLANT
CANNULA SHOULDER 7CM (CANNULA) IMPLANT
CARTRIDGE CURVETEK MED (MISCELLANEOUS) IMPLANT
CARTRIDGE CURVETEK XLRG (MISCELLANEOUS) IMPLANT
CLOTH BEACON ORANGE TIMEOUT ST (SAFETY) ×2 IMPLANT
CLSR STERI-STRIP ANTIMIC 1/2X4 (GAUZE/BANDAGES/DRESSINGS) ×1 IMPLANT
COVER SURGICAL LIGHT HANDLE (MISCELLANEOUS) ×2 IMPLANT
DRAPE INCISE IOBAN 66X45 STRL (DRAPES) ×4 IMPLANT
DRAPE STERI 35X30 U-POUCH (DRAPES) ×2 IMPLANT
DRAPE U-SHAPE 47X51 STRL (DRAPES) ×4 IMPLANT
DRILL TAK (DRILL)
DRSG PAD ABDOMINAL 8X10 ST (GAUZE/BANDAGES/DRESSINGS) ×5 IMPLANT
DURAPREP 26ML APPLICATOR (WOUND CARE) ×2 IMPLANT
ELECT MENISCUS 165MM 90D (ELECTRODE) IMPLANT
ELECT REM PT RETURN 9FT ADLT (ELECTROSURGICAL) ×2
ELECTRODE REM PT RTRN 9FT ADLT (ELECTROSURGICAL) ×1 IMPLANT
FILTER STRAW FLUID ASPIR (MISCELLANEOUS) ×2 IMPLANT
GAUZE XEROFORM 1X8 LF (GAUZE/BANDAGES/DRESSINGS) ×2 IMPLANT
GLOVE BIO SURGEON ST LM GN SZ9 (GLOVE) ×2 IMPLANT
GLOVE BIOGEL PI IND STRL 8 (GLOVE) ×1 IMPLANT
GLOVE BIOGEL PI INDICATOR 8 (GLOVE) ×1
GLOVE SURG ORTHO 8.0 STRL STRW (GLOVE) ×2 IMPLANT
GOWN PREVENTION PLUS LG XLONG (DISPOSABLE) IMPLANT
GOWN STRL NON-REIN LRG LVL3 (GOWN DISPOSABLE) ×6 IMPLANT
KIT BASIN OR (CUSTOM PROCEDURE TRAY) ×2 IMPLANT
KIT BIO-TENODESIS 3X8 DISP (MISCELLANEOUS) ×2
KIT INSRT BABSR STRL DISP BTN (MISCELLANEOUS) IMPLANT
KIT ROOM TURNOVER OR (KITS) ×2 IMPLANT
MANIFOLD NEPTUNE II (INSTRUMENTS) ×2 IMPLANT
NDL HYPO 25X1 1.5 SAFETY (NEEDLE) ×1 IMPLANT
NDL SPNL 18GX3.5 QUINCKE PK (NEEDLE) ×1 IMPLANT
NDL SUT 6 .5 CRC .975X.05 MAYO (NEEDLE) ×1 IMPLANT
NEEDLE FILTER BLUNT 18X 1/2SAF (NEEDLE) ×1
NEEDLE FILTER BLUNT 18X1 1/2 (NEEDLE) ×1 IMPLANT
NEEDLE HYPO 25X1 1.5 SAFETY (NEEDLE) ×2 IMPLANT
NEEDLE MAYO TAPER (NEEDLE) ×2
NEEDLE SPNL 18GX3.5 QUINCKE PK (NEEDLE) ×2 IMPLANT
NS IRRIG 1000ML POUR BTL (IV SOLUTION) ×2 IMPLANT
PACK SHOULDER (CUSTOM PROCEDURE TRAY) ×2 IMPLANT
PAD ARMBOARD 7.5X6 YLW CONV (MISCELLANEOUS) ×4 IMPLANT
PUSHLOCK PEEK 4.5X24 (Orthopedic Implant) ×2 IMPLANT
SCREW BIO TENODESIS 9.0MM (Screw) ×1 IMPLANT
SET ARTHROSCOPY TUBING (MISCELLANEOUS) ×2
SET ARTHROSCOPY TUBING LN (MISCELLANEOUS) ×1 IMPLANT
SLING ARM IMMOBILIZER MED (SOFTGOODS) IMPLANT
SPEAR FASTAKII (SLEEVE) IMPLANT
SPONGE GAUZE 4X4 12PLY (GAUZE/BANDAGES/DRESSINGS) ×2 IMPLANT
SPONGE LAP 4X18 X RAY DECT (DISPOSABLE) ×4 IMPLANT
STRIP CLOSURE SKIN 1/2X4 (GAUZE/BANDAGES/DRESSINGS) ×2 IMPLANT
SUCTION FRAZIER TIP 10 FR DISP (SUCTIONS) ×2 IMPLANT
SUT ETHILON 3 0 PS 1 (SUTURE) ×2 IMPLANT
SUT FIBERWIRE 2-0 18 17.9 3/8 (SUTURE) ×4
SUT PROLENE 3 0 PS 2 (SUTURE) ×3 IMPLANT
SUT VIC AB 0 CT1 27 (SUTURE) ×4
SUT VIC AB 0 CT1 27XBRD ANBCTR (SUTURE) ×2 IMPLANT
SUT VIC AB 1 CT1 27 (SUTURE) ×2
SUT VIC AB 1 CT1 27XBRD ANBCTR (SUTURE) ×1 IMPLANT
SUT VIC AB 2-0 CT1 27 (SUTURE) ×4
SUT VIC AB 2-0 CT1 TAPERPNT 27 (SUTURE) ×1 IMPLANT
SUT VICRYL 0 UR6 27IN ABS (SUTURE) ×1 IMPLANT
SUTURE FIBERWR 2-0 18 17.9 3/8 (SUTURE) IMPLANT
SYR 20CC LL (SYRINGE) ×4 IMPLANT
SYR 3ML LL SCALE MARK (SYRINGE) ×2 IMPLANT
SYR TB 1ML LUER SLIP (SYRINGE) ×3 IMPLANT
TOWEL OR 17X24 6PK STRL BLUE (TOWEL DISPOSABLE) ×2 IMPLANT
TOWEL OR 17X26 10 PK STRL BLUE (TOWEL DISPOSABLE) ×2 IMPLANT
WAND 90 DEG TURBOVAC W/CORD (SURGICAL WAND) ×1 IMPLANT
WATER STERILE IRR 1000ML POUR (IV SOLUTION) ×2 IMPLANT

## 2012-05-26 NOTE — Brief Op Note (Signed)
05/26/2012  3:48 PM  PATIENT:  Marissa Roberts  67 y.o. female  PRE-OPERATIVE DIAGNOSIS:  Right Shoulder Biceps Tendonitis, Rotator Cuff Tear  POST-OPERATIVE DIAGNOSIS:  Right Shoulder Biceps Tendonitis, Rotator Cuff Tear  PROCEDURE:  Procedure(s): Right Shoulder Diagnostic Operative Arthroscopy, Subacromial Decompression, Biceps Tenodesis, Mini Open Rotator Cuff Repair, extensive debridement  SURGEON:  Surgeon(s): Cammy Copa, MD  ASSISTANT: Jodene Nam  ANESTHESIA:   general  EBL: 25 ml    Total I/O In: 2000 [I.V.:2000] Out: -   BLOOD ADMINISTERED: none  DRAINS: none   LOCAL MEDICATIONS USED:  none  SPECIMEN:  No Specimen  COUNTS:  YES  TOURNIQUET:  * No tourniquets in log *  DICTATION: .Other Dictation: Dictation Number [id number]  PLAN OF CARE: Admit for overnight observation  PATIENT DISPOSITION:  PACU - hemodynamically stable

## 2012-05-26 NOTE — Anesthesia Postprocedure Evaluation (Signed)
  Anesthesia Post-op Note  Patient: Sudan D Mcilvain  Procedure(s) Performed: Procedure(s) with comments: Right Shoulder Diagnostic Operative Arthroscopy, Subacromial Decompression, Biceps Tenodesis, Mini Open Rotator Cuff Repair (Right) - Right Shoulder Diagnostic Operative Arthroscopy, Subacromial Decompression, Biceps Tenodesis, Mini Open Rotator Cuff Repair  Patient Location: PACU  Anesthesia Type:GA combined with regional for post-op pain  Level of Consciousness: awake, oriented and patient cooperative  Airway and Oxygen Therapy: Patient Spontanous Breathing  Post-op Pain: mild  Post-op Assessment: Post-op Vital signs reviewed, Patient's Cardiovascular Status Stable, Respiratory Function Stable, Patent Airway, No signs of Nausea or vomiting and Pain level controlled  Post-op Vital Signs: stable  Complications: No apparent anesthesia complications

## 2012-05-26 NOTE — H&P (Signed)
Marissa Roberts is an 67 y.o. female.   Chief Complaint: Right shoulder pain HPI: Marissa Roberts is a 67 year old female with progressive right shoulder pain and painful range of motion. She denies any discrete history of injury but does report persistent pain popping and weakness in the right shoulder which is developed over several years periods become progressively worse and affect her activities of daily living and she does have some night symptoms as well. She's failed conservative management including activity modification injections. MRI scanning does show full-thickness rotator cuff tear along with subluxation of the biceps tendon and some tearing of the subscapularis tendon  Past Medical History  Diagnosis Date  . Hypertension   . Thyroid disease   . Diabetes mellitus   . CHF (congestive heart failure)   . PONV (postoperative nausea and vomiting)   . Hypothyroidism   . Pneumonia     hx of  . Bronchitis     hx of  . GERD (gastroesophageal reflux disease)   . Arthritis   . Hypercholesteremia     Past Surgical History  Procedure Laterality Date  . Knee arhtroscopy    . Carpel tunnel    . Foot surgery    . Abdominal hysterectomy      partial  . Tonsillectomy    . Breast surgery      lumpectomy left breast  . Colonoscopy w/ polypectomy      Family History  Problem Relation Age of Onset  . Adopted: Yes  . Coronary artery disease    . Diabetes Mother   . Heart disease Mother   . Hyperlipidemia Mother    Social History:  reports that she quit smoking about 22 years ago. She does not have any smokeless tobacco history on file. She reports that she does not drink alcohol or use illicit drugs.  Allergies:  Allergies  Allergen Reactions  . Contrast Media (Iodinated Diagnostic Agents)     Throat swells from ct con tra  . Erythromycin     REACTION: rash  . Penicillins     REACTION: rash    No prescriptions prior to admission    No results found for this or any previous  visit (from the past 48 hour(s)). No results found.  Review of Systems  Constitutional: Negative.   HENT: Negative.   Eyes: Negative.   Respiratory: Negative.   Cardiovascular: Negative.   Gastrointestinal: Negative.   Genitourinary: Negative.   Musculoskeletal: Positive for joint pain.  Skin: Negative.   Neurological: Negative.   Endo/Heme/Allergies: Negative.   Psychiatric/Behavioral: Negative.     There were no vitals taken for this visit. Physical Exam  Constitutional: She appears well-developed.  HENT:  Head: Normocephalic.  Eyes: Pupils are equal, round, and reactive to light.  Neck: Normal range of motion.  Cardiovascular: Normal rate.   Respiratory: Effort normal.  GI: Soft.  Neurological: She is alert.  Skin: Skin is warm.  examination of the right shoulder demonstrates no shift of extra rotation 15 abduction 50 bilaterally. No real a.c. joint tenderness right palpation impingement signs positive mild supraspinatus weakness 5 minus out of 5 on the right compared to 5 placenta file on the left distal biceps tenderness on the right compared to the left matter such function to the arm and hand is intact on the right left-hand side cervical spine range of motion is full  Assessment/Plan Impression is right shoulder pain with rotator cuff tearing biceps subluxation demonstrated by MRI scanning. Plan is arthroscopy debridement biceps  tendon release and extensive debridement of the labrum subacromial decompression mini open rotator cuff repair biceps tenodesis risk and benefits discussed with patient we will and to infection nerve vessel damage incomplete pain relief need for prolonged rehabilitation as well as failure of repair patient understands risk and benefits and wished to proceed all questions answered  Shaquanna Lycan SCOTT 05/26/2012, 7:42 AM

## 2012-05-26 NOTE — Anesthesia Procedure Notes (Addendum)
Anesthesia Regional Block:  Interscalene brachial plexus block  Pre-Anesthetic Checklist: ,, timeout performed, Correct Patient, Correct Site, Correct Laterality, Correct Procedure, Correct Position, site marked, Risks and benefits discussed,  Surgical consent,  Pre-op evaluation,  At surgeon's request and post-op pain management  Laterality: Right and Upper  Prep: chloraprep       Needles:  Injection technique: Single-shot  Needle Type: Echogenic Needle     Needle Length: 5cm 5 cm Needle Gauge: 21    Additional Needles:  Procedures: ultrasound guided (picture in chart) Interscalene brachial plexus block Narrative:  Start time: 05/26/2012 11:45 AM End time: 05/26/2012 11:55 AM Injection made incrementally with aspirations every 5 mL.  Performed by: Personally  Anesthesiologist: Sheldon Silvan  Supraclavicular block Performed by: Donette Larry E    Procedure Name: Intubation Date/Time: 05/26/2012 1:03 PM Performed by: Fransisca Kaufmann Pre-anesthesia Checklist: Patient identified, Emergency Drugs available, Suction available, Patient being monitored and Timeout performed Patient Re-evaluated:Patient Re-evaluated prior to inductionOxygen Delivery Method: Circle system utilized Preoxygenation: Pre-oxygenation with 100% oxygen Intubation Type: IV induction Ventilation: Mask ventilation without difficulty Laryngoscope Size: Miller and 2 Grade View: Grade II Tube type: Oral Tube size: 7.5 mm Number of attempts: 2 Airway Equipment and Method: Stylet Placement Confirmation: ETT inserted through vocal cords under direct vision,  breath sounds checked- equal and bilateral and positive ETCO2 Secured at: 22 cm Tube secured with: Tape Dental Injury: Teeth and Oropharynx as per pre-operative assessment

## 2012-05-26 NOTE — Anesthesia Preprocedure Evaluation (Signed)
Anesthesia Evaluation  Patient identified by MRN, date of birth, ID band Patient awake, Patient confused and Patient unresponsive    Reviewed: Allergy & Precautions, H&P , NPO status , Patient's Chart, lab work & pertinent test results  History of Anesthesia Complications (+) PONV  Airway Mallampati: I TM Distance: >3 FB Neck ROM: Full    Dental  (+) Teeth Intact and Dental Advisory Given   Pulmonary  breath sounds clear to auscultation        Cardiovascular hypertension, Pt. on medications Rhythm:Regular Rate:Normal     Neuro/Psych    GI/Hepatic   Endo/Other  diabetes, Well Controlled, Type 2, Insulin Dependent and Oral Hypoglycemic AgentsMorbid obesity  Renal/GU      Musculoskeletal   Abdominal   Peds  Hematology   Anesthesia Other Findings   Reproductive/Obstetrics                           Anesthesia Physical Anesthesia Plan  ASA: III  Anesthesia Plan: General   Post-op Pain Management:    Induction: Intravenous  Airway Management Planned: Oral ETT  Additional Equipment:   Intra-op Plan:   Post-operative Plan: Extubation in OR  Informed Consent: I have reviewed the patients History and Physical, chart, labs and discussed the procedure including the risks, benefits and alternatives for the proposed anesthesia with the patient or authorized representative who has indicated his/her understanding and acceptance.   Dental advisory given  Plan Discussed with: CRNA and Anesthesiologist  Anesthesia Plan Comments:         Anesthesia Quick Evaluation

## 2012-05-26 NOTE — Transfer of Care (Signed)
Immediate Anesthesia Transfer of Care Note  Patient: [name]  Procedure(s) Performed: Procedure(s) with comments: Right Shoulder Diagnostic Operative Arthroscopy, Subacromial Decompression, Biceps Tenodesis, Mini Open Rotator Cuff Repair (Right) - Right Shoulder Diagnostic Operative Arthroscopy, Subacromial Decompression, Biceps Tenodesis, Mini Open Rotator Cuff Repair  Patient Location: PACU  Anesthesia Type:General and Regional  Level of Consciousness: awake, alert , oriented and sedated  Airway & Oxygen Therapy: Patient Spontanous Breathing and Patient connected to face mask oxygen  Post-op Assessment: Report given to PACU RN, Post -op Vital signs reviewed and stable and Patient moving all extremities  Post vital signs: Reviewed and stable  Complications: No apparent anesthesia complications

## 2012-05-26 NOTE — Progress Notes (Signed)
Patient given 2 ml of Versed and 2 ml of Fentanyl per Dr. Ivin Booty orders.

## 2012-05-26 NOTE — Preoperative (Signed)
Beta Blockers   Reason not to administer Beta Blockers:Not Applicable 

## 2012-05-27 LAB — GLUCOSE, CAPILLARY: Glucose-Capillary: 224 mg/dL — ABNORMAL HIGH (ref 70–99)

## 2012-05-27 MED ORDER — METHOCARBAMOL 500 MG PO TABS: 500.0000 mg | ORAL_TABLET | Freq: Four times a day (QID) | ORAL | Status: DC | PRN

## 2012-05-27 MED ORDER — OXYCODONE HCL 5 MG PO TABS: 5.0000 mg | ORAL_TABLET | ORAL | Status: DC | PRN

## 2012-05-27 NOTE — Op Note (Signed)
NAMEJAIANA, Marissa Roberts NO.:  [id number]  MEDICAL RECORD NO.:  [id number]  LOCATION:  5N10C                        FACILITY:  [facility]  PHYSICIAN:  Burnard Bunting, M.D.    DATE OF BIRTH:  1945-12-01  DATE OF PROCEDURE:  05/26/2012 DATE OF DISCHARGE:                              OPERATIVE REPORT   PREOPERATIVE DIAGNOSIS:  Right shoulder rotator cuff tear, biceps tendinitis, and bursitis.  POSTOPERATIVE DIAGNOSIS:  Right shoulder supraspinatus tear and partial infraspinatus tear measuring 2 x 2 cm, U-shaped with significant biceps tendonitis, small chondral defect, humeral head.  PROCEDURE:  Right shoulder diagnostic arthroscopy with biceps tendon release, extensive debridement of the labrum and chondral surface of the humeral head, and rotator cuff with mini open rotator cuff tear repair, biceps tenodesis, subacromial decompression.  SURGEON:  Burnard Bunting, M.D.  ASSISTANT:  Wende Neighbors, P.A.  ANESTHESIA:  General endotracheal.  ESTIMATED BLOOD LOSS:  10 mL.  INDICATIONS:  Marissa Roberts is a patient with right shoulder pain, progressive with failure of conservative management, presents now for operative management for explanation risks and benefits.  She has subluxation of biceps tendon.  PROCEDURE IN DETAIL:  The patient was brought to the operating room, where general tracheal anesthesia was induced.  Preop antibiotics administered.  Time-out was called.  The patient was placed in a beach- chair position with the head in neutral position.  Right shoulder, arm, and hand prepped free.  The area was pre-scrubbed with alcohol, Betadine, which was allowed to dry, prepped with DuraPrep solution, draped in a sterile manner.  Collier Flowers was used cover the axilla.  The time- out was called, and epinephrine solution injected in the subacromial space.  Saline injected into the glenohumeral joint.  Posterior portal was created 2 cm medial and inferior to pressure  all margin of the acromion.  Diagnostic arthroscopy was performed.  Anterior portal was created under direct visualization.  The patient had a significant tearing and fraying of the biceps tendon with subluxation of the biceps tendon.  The intra-articular subscap, however, was intact except for the upper most portion.  Rotator cuff tear was identified, debrided, also chondral defect on the humeral head from the biceps tearing and subluxation was present.  This was also debrided.  It was not full thickness.  Early synovitis and inflamed tissue present in the rotator interval, although under examination under anesthesia, the patient did have about 60 degrees of external rotation and 50 degrees abduction. The patient also had full forward flexion.  Following biceps tendon release and extensive debridement, instruments were removed.  Portals were closed.  The entire shoulder was covered with Ioban, and incision was made off the anterolateral margin of the acromion.  Skin and subcutaneous tissue was sharply divided.  Deltoid split made and measured to a distance of 4 cm from the anterolateral margin of the acromion.  Stay suture was placed.  Bursectomy and subacromial decompression was performed.  The patient had type 1 acromion.  Did have some impingement, which was relieved with soft tissue and partial bony resection and release of the CA ligament.  Rotator cuff tear was identified.  It was a U-shaped tear, degenerative in nature.  Edges were freshened up, __________ in the footprint area.  The U-shaped tear was closed using inverted 2-0 FiberWire suture, two 5.5 corkscrew suture anchors were then placed, and the rotator cuff tear was then repaired back down to the footprint with the aid of 2 push locks.  All in all, all 8 sutures from the 2 corkscrews were used.  Watertight repair was achieved.  There was some bunching of the tendon medial to the repair site, which was partially debrided,  but it was clear under the subacromial decompression.  At this time, biceps tenodesis was performed.  The patient had a large biceps tendon measuring about 9 mm. This was tenodesed into the bicipital groove using a 9 x 23 mm bio tenodesis screw.  Good fixation was achieved.  The transverse humeral ligament was opened on its medial side.  At this time, the subscapularis was inspected and found to be intact.  Transverse humeral ligament was closed using 0 Vicryl suture.  The deltoid split was closed using 0 Vicryl suture followed by interrupted inverted 2-0 Vicryl suture and running 3-0 pullout Prolene.  Bulky dressing and shoulder immobilizer were applied.  Velna Hatchet Vernon's assistance was required all times during the case for retraction, opening, closing, limb positioning.  Her assistance was a medical necessity.     Burnard Bunting, M.D.     GSD/MEDQ  D:  05/26/2012  T:  05/27/2012  Job:  [id number]

## 2012-05-27 NOTE — Evaluation (Addendum)
Occupational Therapy Evaluation Patient Details Name: Marissa Roberts MRN: [id number] DOB: Feb 19, 1946 Today's Date: 05/27/2012 Time: 4098-1191 OT Time Calculation (min): 32 min  OT Assessment / Plan / Recommendation Clinical Impression  Pt is a 67 yr old female admiited for right rotator cuff repair.  Pt and spouse have been educated on Pendulums, AROM exercises for the non-involved joints, positioning, ADLS, and sling wear.  No further acute care OT needs.      OT Assessment  Progress rehab of shoulder as ordered by MD at follow-up appointment    Follow Up Recommendations  No OT follow up       Equipment Recommendations  None recommended by OT          Precautions / Restrictions Precautions Precautions: Shoulder Type of Shoulder Precautions: NO AROM Precaution Booklet Issued: Yes (comment) Precaution Comments: education on ADLS, sling care, AROM for elbow and digts, and Pendulums  Required Braces or Orthoses: Other Brace/Splint Other Brace/Splint: sling Restrictions Weight Bearing Restrictions: Yes RUE Weight Bearing: Non weight bearing   Pertinent Vitals/Pain HR stable O2 sts 97% on room air    ADL  Eating/Feeding: Simulated;Modified independent Where Assessed - Eating/Feeding: Chair Grooming: Simulated;Supervision/safety Where Assessed - Grooming: Unsupported standing Upper Body Bathing: Simulated;Minimal assistance Where Assessed - Upper Body Bathing: Unsupported standing Lower Body Bathing: Simulated;Supervision/safety Where Assessed - Lower Body Bathing: Unsupported standing Upper Body Dressing: Simulated;Minimal assistance Where Assessed - Upper Body Dressing: Unsupported sitting Lower Body Dressing: Simulated Where Assessed - Lower Body Dressing: Unsupported sit to stand Toilet Transfer: Simulated;Supervision/safety Toilet Transfer Method: Stand pivot Toilet Transfer Equipment: Regular height toilet Toileting - Clothing Manipulation and Hygiene:  Simulated;Supervision/safety Where Assessed - Engineer, mining and Hygiene: Sit to stand from 3-in-1 or toilet Tub/Shower Transfer Method: Not assessed Transfers/Ambulation Related to ADLs: Pt able to ambulate with independence. ADL Comments: Educated pt and spouse on sling wear and care, performance of ADLS modified, AROM exercises for non-involved joints, and Pendulum exercises.  Husband can assist post discharge.      Visit Information  Last OT Received On: 05/27/12 Assistance Needed: +1    Subjective Data  Subjective: I want to get some pain medicine.   Prior Functioning     Home Living Lives With: Spouse Available Help at Discharge: Available 24 hours/day Home Access: Stairs to enter [employer] of Steps: 2 Home Layout: One level Bathroom Shower/Tub: Engineer, manufacturing systems: Standard Bathroom Accessibility: Yes Home Adaptive Equipment: None Prior Function Level of Independence: Independent Able to Take Stairs?: Yes Driving: Yes Communication Communication: No difficulties Dominant Hand: Right         Vision/Perception Vision - History Baseline Vision: Wears glasses all the time Patient Visual Report: No change from baseline Vision - Assessment Eye Alignment: Within Functional Limits Vision Assessment: Vision not tested Perception Perception: Within Functional Limits Praxis Praxis: Intact   Cognition  Cognition Overall Cognitive Status: Appears within functional limits for tasks assessed/performed Arousal/Alertness: Awake/alert Orientation Level: Appears intact for tasks assessed Behavior During Session: [facility] for tasks performed    Extremity/Trunk Assessment Right Upper Extremity Assessment RUE ROM/Strength/Tone: Deficits;Unable to fully assess;Due to precautions RUE ROM/Strength/Tone Deficits: RUE positioned in sling able to move all digts, elbow, and wrist actively. RUE Sensation: WFL - Light Touch RUE  Coordination: WFL - gross/fine motor Left Upper Extremity Assessment LUE ROM/Strength/Tone: Within functional levels LUE Sensation: WFL - Light Touch LUE Coordination: WFL - gross/fine motor Trunk Assessment Trunk Assessment: Normal     Mobility Transfers Transfers: Sit  to Stand;Stand to Sit Sit to Stand: 6: Modified independent (Device/Increase time);With upper extremity assist Stand to Sit: 6: Modified independent (Device/Increase time);With upper extremity assist     Exercise Shoulder Exercises Pendulum Exercise: PROM;15 reps;Standing;Right Wrist Flexion: AROM;15 reps;Right;Seated Wrist Extension: AROM;Right;15 reps;Seated Digit Composite Flexion: AROM;Seated;Right;10 reps Neck Lateral Flexion - Left: AROM;5 reps;Seated Donning/doffing shirt without moving shoulder: Caregiver independent with task Method for sponge bathing under operated UE: Caregiver independent with task Donning/doffing sling/immobilizer: Patient able to independently direct caregiver;Independent;Caregiver independent with task Correct positioning of sling/immobilizer: Patient able to independently direct caregiver;Caregiver independent with task Pendulum exercises (written home exercise program): Supervision/safety ROM for elbow, wrist and digits of operated UE: Independent Sling wearing schedule (on at all times/off for ADL's): Independent;Caregiver independent with task Proper positioning of operated UE when showering: Independent;Caregiver independent with task Dressing change:  (nursing to address) Positioning of UE while sleeping: Independent   Balance Balance Balance Assessed: Yes Dynamic Standing Balance Dynamic Standing - Level of Assistance: 5: Stand by assistance   End of Session OT - End of Session Equipment Utilized During Treatment: Other (comment) (sling) Activity Tolerance: Patient limited by pain Patient left: in chair;with call bell/phone within reach;with family/visitor present Nurse  Communication: Other (comment) (Need for pain meds and completion of OT education)  GO Functional Assessment Tool Used: clinical judgement Functional Limitation: Self care Self Care Current Status (W0981): At least 1 percent but less than 20 percent impaired, limited or restricted Self Care Goal Status (X9147): At least 1 percent but less than 20 percent impaired, limited or restricted Self Care Discharge Status [phone]): At least 1 percent but less than 20 percent impaired, limited or restricted   Michaelangelo Mittelman OTR/L Pager number [phone] 05/27/2012, 10:40 AM

## 2012-05-27 NOTE — Progress Notes (Signed)
Subjective: Pain controlled - block worn off   Objective: Vital signs in last 24 hours: Temp:  [97.5 F (36.4 C)-98.5 F (36.9 C)] 98.4 F (36.9 C) (02/26 0631) Pulse Rate:  [66-98] 92 (02/26 0631) Resp:  [16-20] 18 (02/26 0631) BP: (130-161)/(61-83) 155/62 mmHg (02/26 0631) SpO2:  [92 %-100 %] 94 % (02/26 0631)  Intake/Output from previous day: 02/25 0701 - 02/26 0700 In: 2965 [I.V.:2915; IV Piggyback:50] Out: 100 [Blood:100] Intake/Output this shift:    Exam:  Sensation intact distally Intact pulses distally Dorsiflexion/Plantar flexion intact  Labs: No results found for this basename: HGB,  in the last 72 hours No results found for this basename: WBC, RBC, HCT, PLT,  in the last 72 hours No results found for this basename: NA, K, CL, CO2, BUN, CREATININE, GLUCOSE, CALCIUM,  in the last 72 hours No results found for this basename: LABPT, INR,  in the last 72 hours  Assessment/Plan: Plan dc today after OT and dressing change   Marissa Roberts 05/27/2012, 7:21 AM

## 2012-05-27 NOTE — Progress Notes (Signed)
Pt discharged to home accompanied by husband. Discharge information and rx given and explained and pt stated understanding. Pts IV was removed. Pt left unit in a stable condition via wheelchair.

## 2012-05-27 NOTE — Progress Notes (Signed)
UR COMPLETED  

## 2012-05-28 ENCOUNTER — Encounter (HOSPITAL_COMMUNITY): Payer: Self-pay | Admitting: Orthopedic Surgery

## 2012-06-01 ENCOUNTER — Ambulatory Visit: Payer: Self-pay | Admitting: Family

## 2012-06-08 NOTE — Discharge Summary (Signed)
Physician Discharge Summary  Patient ID: Marissa Roberts MRN: [id number] DOB/AGE: Aug 30, 1945 67 y.o.  Admit date: 05/26/2012 Discharge date: 05/27/2012  Admission Diagnoses:  Rotator cuff tear Discharge Diagnoses:  Same  Surgeries: Procedure(s): Right Shoulder Diagnostic Operative Arthroscopy, Subacromial Decompression, Biceps Tenodesis, Mini Open Rotator Cuff Repair on 05/26/2012   Consultants:    Discharged Condition: Stable  Hospital Course: Marissa Roberts is an 67 y.o. female who was admitted 05/26/2012 with a chief complaint of right shoulder pain, and found to have a diagnosis of rotator cuff tear.  They were brought to the operating room on 05/26/2012 and underwent the above named procedures.  She tolerated the procedure well - was seen by OT and dced home in good condition  Antibiotics given:  Anti-infectives   Start     Dose/Rate Route Frequency Ordered Stop   05/26/12 2200  clindamycin (CLEOCIN) IVPB 600 mg     600 mg 100 mL/hr over 30 Minutes Intravenous 3 times per day 05/26/12 1827 05/27/12 0624   05/26/12 0600  clindamycin (CLEOCIN) IVPB 900 mg     900 mg 100 mL/hr over 30 Minutes Intravenous On call to O.R. 05/25/12 1455 05/26/12 1305    .  Recent vital signs:  Filed Vitals:   05/27/12 0915  BP:   Pulse: 80  Temp:   Resp:     Recent laboratory studies:  Results for orders placed during the hospital encounter of 05/26/12  GLUCOSE, CAPILLARY      Result Value Range   Glucose-Capillary 92  70 - 99 mg/dL  GLUCOSE, CAPILLARY      Result Value Range   Glucose-Capillary 136 (*) 70 - 99 mg/dL  GLUCOSE, CAPILLARY      Result Value Range   Glucose-Capillary 224 (*) 70 - 99 mg/dL  GLUCOSE, CAPILLARY      Result Value Range   Glucose-Capillary 246 (*) 70 - 99 mg/dL  GLUCOSE, CAPILLARY      Result Value Range   Glucose-Capillary 241 (*) 70 - 99 mg/dL   Comment 1 Notify RN      Discharge Medications:     Medication List    TAKE these medications       amLODipine-olmesartan 5-40 MG per tablet  Commonly known as:  AZOR  Take 1 tablet by mouth daily.     B-D ULTRAFINE III SHORT PEN 31G X 8 MM Misc  Generic drug:  Insulin Pen Needle  by Does not apply route. To use with lantus       CALCIUM 600 1500 MG Tabs  Generic drug:  Calcium Carbonate  Take 2 tablets by mouth daily.     ezetimibe-simvastatin 10-20 MG per tablet  Commonly known as:  VYTORIN  Take 0.5 tablets by mouth at bedtime. 1/2 tab daily       FREESTYLE LITE test strip  Generic drug:  glucose blood  USE TWICE DAILY TO TEST BLOOD SUGAR     furosemide 40 MG tablet  Commonly known as:  LASIX  Take 80 mg by mouth 2 (two) times daily.     glucosamine-chondroitin 500-400 MG tablet  Take 1 tablet by mouth daily.     insulin glargine 100 UNIT/ML injection  Commonly known as:  LANTUS  Inject 55 Units into the skin at bedtime.     levothyroxine 137 MCG tablet  Commonly known as:  SYNTHROID, LEVOTHROID  Take 137 mcg by mouth daily.     metFORMIN 850 MG tablet  Commonly known as:  GLUCOPHAGE  Take 850 mg by mouth 2 (two) times daily with a meal.     methocarbamol 500 MG tablet  Commonly known as:  ROBAXIN  Take 1 tablet (500 mg total) by mouth every 6 (six) hours as needed.     omeprazole 20 MG capsule  Commonly known as:  PRILOSEC  Take 40 mg by mouth daily.     oxyCODONE 5 MG immediate release tablet  Commonly known as:  Oxy IR/ROXICODONE  Take 1-2 tablets (5-10 mg total) by mouth every 3 (three) hours as needed.     potassium chloride 10 MEQ tablet  Commonly known as:  K-DUR  Take 10 mEq by mouth daily.        Diagnostic Studies: No results found.  Disposition: 01-Home or Self Care      Discharge Orders   Future Appointments Provider Department Dept Phone   08/14/2012 9:45 AM Stacie Glaze, MD [facility] [phone]   Future Orders Complete By Expires     Call MD / Call 911  As directed     Comments:      If you  experience chest pain or shortness of breath, CALL 911 and be transported to the hospital emergency room.  If you develope a fever above 101 F, pus (white drainage) or increased drainage or redness at the wound, or calf pain, call your surgeon's office.    Constipation Prevention  As directed     Comments:      Drink plenty of fluids.  Prune juice may be helpful.  You may use a stool softener, such as Colace (over the counter) 100 mg twice a day.  Use MiraLax (over the counter) for constipation as needed.    Diet - low sodium heart healthy  As directed     Discharge instructions  As directed     Comments:      Keep incision dry Arm in sling when ambulating CPM 1 hour 3 times a day for at least one week    Increase activity slowly as tolerated  As directed           Signed: DEAN,GREGORY SCOTT 06/08/2012, 5:41 PM

## 2012-06-15 ENCOUNTER — Telehealth: Payer: Self-pay | Admitting: Internal Medicine

## 2012-06-15 NOTE — Telephone Encounter (Signed)
Spoke with patient and she will call back for an appointment. 

## 2012-06-15 NOTE — Telephone Encounter (Signed)
Pt has questions concerning her BS per pt was told to take insulin and callback a couple wk ago. Pt stated her BS this morning was 140. This call was transferred to CAN

## 2012-06-15 NOTE — Telephone Encounter (Signed)
Marissa Roberts is calling about being seen in the office on 05/18/12 and advised to increase her Insulin since she had been running high. She has been giving 63u at [facility] for past week and testing at 140's in the morning. She only 3 pens left and will need Order called in today to [facility] for 90 day supply. Please Call her to confirm that order has been sent and verify that BG is okay.

## 2012-06-17 MED ORDER — INSULIN PEN NEEDLE 31G X 8 MM MISC: 1.0000 | Freq: Every day | Status: DC

## 2012-06-17 NOTE — Telephone Encounter (Signed)
Call in increased pens ok

## 2012-06-17 NOTE — Telephone Encounter (Signed)
rx sent in electronically 

## 2012-06-18 ENCOUNTER — Telehealth: Payer: Self-pay

## 2012-06-18 MED ORDER — INSULIN GLARGINE 100 UNIT/ML ~~LOC~~ SOLN: 55.0000 [IU] | Freq: Every day | SUBCUTANEOUS | Status: DC

## 2012-06-18 NOTE — Telephone Encounter (Signed)
Pt needs lantus rx because needles were [facility] instead of medication. Pt a little upset because she had to pay $25 for the needles that she doesn't need right now and will have to pay $100 for her 3 month supply of lantus when she gets it. I offered to send a temporary supply to pt's local pharmacy but she declined, stating that she will use her husband's lantus if she runs out before her meds arrive

## 2012-06-26 ENCOUNTER — Other Ambulatory Visit: Payer: Self-pay | Admitting: Internal Medicine

## 2012-07-03 ENCOUNTER — Telehealth: Payer: Self-pay | Admitting: Internal Medicine

## 2012-07-03 NOTE — Telephone Encounter (Signed)
Patient called stating that she need samples of vytorin 10-20mg  take 1/2 tab po at bedtime and azor 5-40 mg 1poqd. Please assist.

## 2012-07-03 NOTE — Telephone Encounter (Signed)
Pt informed samples up front

## 2012-07-27 ENCOUNTER — Other Ambulatory Visit: Payer: Self-pay | Admitting: Internal Medicine

## 2012-08-05 ENCOUNTER — Ambulatory Visit: Payer: Medicare Other | Attending: Orthopedic Surgery | Admitting: Physical Therapy

## 2012-08-05 DIAGNOSIS — M25519 Pain in unspecified shoulder: Secondary | ICD-10-CM | POA: Insufficient documentation

## 2012-08-05 DIAGNOSIS — IMO0001 Reserved for inherently not codable concepts without codable children: Secondary | ICD-10-CM | POA: Insufficient documentation

## 2012-08-05 DIAGNOSIS — M25619 Stiffness of unspecified shoulder, not elsewhere classified: Secondary | ICD-10-CM | POA: Insufficient documentation

## 2012-08-14 ENCOUNTER — Encounter: Payer: Self-pay | Admitting: Internal Medicine

## 2012-08-14 ENCOUNTER — Ambulatory Visit (INDEPENDENT_AMBULATORY_CARE_PROVIDER_SITE_OTHER): Payer: Medicare Other | Admitting: Internal Medicine

## 2012-08-14 VITALS — BP 130/80 | HR 76 | Temp 98.2°F | Resp 16 | Ht 65.0 in | Wt 237.0 lb

## 2012-08-14 DIAGNOSIS — E785 Hyperlipidemia, unspecified: Secondary | ICD-10-CM

## 2012-08-14 DIAGNOSIS — I10 Essential (primary) hypertension: Secondary | ICD-10-CM

## 2012-08-14 DIAGNOSIS — E039 Hypothyroidism, unspecified: Secondary | ICD-10-CM

## 2012-08-14 DIAGNOSIS — E119 Type 2 diabetes mellitus without complications: Secondary | ICD-10-CM

## 2012-08-14 DIAGNOSIS — L255 Unspecified contact dermatitis due to plants, except food: Secondary | ICD-10-CM

## 2012-08-14 DIAGNOSIS — K3184 Gastroparesis: Secondary | ICD-10-CM

## 2012-08-14 LAB — BASIC METABOLIC PANEL
Calcium: 9.1 mg/dL (ref 8.4–10.5)
GFR: 117.66 mL/min (ref >60.00)
Glucose, Bld: 165 mg/dL — ABNORMAL HIGH (ref 70–99)
Potassium: 3.3 mEq/L — ABNORMAL LOW (ref 3.5–5.1)
Sodium: 141 mEq/L (ref 135–145)

## 2012-08-14 MED ORDER — AMLODIPINE BESY-BENAZEPRIL HCL 5-40 MG PO CAPS: 1.0000 | ORAL_CAPSULE | Freq: Every day | ORAL | Status: DC

## 2012-08-14 MED ORDER — SAXAGLIPTIN-METFORMIN ER 5-1000 MG PO TB24: 1.0000 | ORAL_TABLET | Freq: Every day | ORAL | Status: DC

## 2012-08-14 MED ORDER — TRIAMCINOLONE ACETONIDE 0.1 % EX CREA: TOPICAL_CREAM | Freq: Two times a day (BID) | CUTANEOUS | Status: DC

## 2012-08-14 NOTE — Patient Instructions (Addendum)
kombiglyze Replaces metformin

## 2012-08-14 NOTE — Progress Notes (Signed)
Subjective:    Patient ID: Marissa Roberts, female    DOB: 29-Dec-1945, 67 y.o.   MRN: [id number]  HPI Patient is a 67 year old female who presents for followup of adult onset diabetes.  She is on Lantus and metformin and her blood glucoses are ranging from 260 approximately to about 139.  She has poor control both of her blood sugars and weight however her blood pressure is stable... her last A1c was 8.7.     Review of Systems  Constitutional: Negative for activity change, appetite change and fatigue.       Weight gain  HENT: Negative for ear pain, congestion, neck pain, postnasal drip and sinus pressure.   Eyes: Negative for redness and visual disturbance.  Respiratory: Negative for cough, shortness of breath and wheezing.   Gastrointestinal: Negative for abdominal pain and abdominal distention.  Genitourinary: Negative for dysuria, frequency and menstrual problem.  Musculoskeletal: Negative for myalgias, joint swelling and arthralgias.  Skin: Negative for rash and wound.  Neurological: Negative for dizziness, weakness and headaches.  Hematological: Negative for adenopathy. Does not bruise/bleed easily.  Psychiatric/Behavioral: Negative for sleep disturbance and decreased concentration.   Past Medical History  Diagnosis Date  . Hypertension   . Thyroid disease   . Diabetes mellitus   . CHF (congestive heart failure)   . PONV (postoperative nausea and vomiting)   . Hypothyroidism   . Pneumonia     hx of  . Bronchitis     hx of  . GERD (gastroesophageal reflux disease)   . Arthritis   . Hypercholesteremia     History   Social History  . Marital Status: Married    Spouse Name: N/A    Number of Children: N/A  . Years of Education: N/A   Occupational History  . Not on file.   Social History Main Topics  . Smoking status: Former Smoker    Quit date: 04/01/1990  . Smokeless tobacco: Not on file  . Alcohol Use: No  . Drug Use: No  . Sexually Active: Not on  file   Other Topics Concern  . Not on file   Social History Narrative  . No narrative on file    Past Surgical History  Procedure Laterality Date  . Knee arhtroscopy    . Carpel tunnel    . Foot surgery    . Abdominal hysterectomy      partial  . Tonsillectomy    . Breast surgery      lumpectomy left breast  . Colonoscopy w/ polypectomy    . Shoulder arthroscopy with subacromial decompression, rotator cuff repair and bicep tendon repair Right 05/26/2012    Procedure: Right Shoulder Diagnostic Operative Arthroscopy, Subacromial Decompression, Biceps Tenodesis, Mini Open Rotator Cuff Repair;  Surgeon: Cammy Copa, MD;  Location: [facility] OR;  Service: Orthopedics;  Laterality: Right;  Right Shoulder Diagnostic Operative Arthroscopy, Subacromial Decompression, Biceps Tenodesis, Mini Open Rotator Cuff Repair    Family History  Problem Relation Age of Onset  . Adopted: Yes  . Coronary artery disease    . Diabetes Mother   . Heart disease Mother   . Hyperlipidemia Mother     Allergies  Allergen Reactions  . Contrast Media (Iodinated Diagnostic Agents)     Throat swells from ct con tra  . Adhesive (Tape)     No paper tape, causes blisters  . Erythromycin     REACTION: rash  . Penicillins     REACTION: rash    Current  Outpatient Prescriptions on File Prior to Visit  Medication Sig Dispense Refill  . amLODipine-olmesartan (AZOR) 5-40 MG per tablet Take 1 tablet by mouth daily.      . Calcium Carbonate (CALCIUM 600) 1500 MG TABS Take 2 tablets by mouth daily.       Marland Kitchen ezetimibe-simvastatin (VYTORIN) 10-20 MG per tablet Take 0.5 tablets by mouth at bedtime. 1/2 tab daily       . FREESTYLE LITE test strip USE TWICE DAILY TO TEST BLOOD SUGAR  100 each  4  . furosemide (LASIX) 40 MG tablet Take 80 mg by mouth 2 (two) times daily.      Marland Kitchen glucosamine-chondroitin 500-400 MG tablet Take 1 tablet by mouth daily.        . insulin glargine (LANTUS) 100 UNIT/ML injection Inject 0.55  mLs (55 Units total) into the skin at bedtime.  10 mL  3  . Insulin Pen Needle (B-D ULTRAFINE III SHORT PEN) 31G X 8 MM MISC 1 each by Does not apply route daily. To use with lantus  100 each  3  . levothyroxine (SYNTHROID, LEVOTHROID) 137 MCG tablet TAKE ONE TABLET BY MOUTH ONCE DAILY  30 tablet  0  . metFORMIN (GLUCOPHAGE) 850 MG tablet Take 850 mg by mouth 2 (two) times daily with a meal.      . methocarbamol (ROBAXIN) 500 MG tablet Take 1 tablet (500 mg total) by mouth every 6 (six) hours as needed.  30 tablet  0  . omeprazole (PRILOSEC) 20 MG capsule Take 40 mg by mouth daily.       Marland Kitchen oxyCODONE (OXY IR/ROXICODONE) 5 MG immediate release tablet Take 1-2 tablets (5-10 mg total) by mouth every 3 (three) hours as needed.  60 tablet  0  . potassium chloride (K-DUR) 10 MEQ tablet Take 10 mEq by mouth daily.       No current facility-administered medications on file prior to visit.    BP 130/80  Pulse 76  Temp(Src) 98.2 F (36.8 C)  Resp 16  Ht 5\' 5"  (1.651 m)  Wt 237 lb (107.502 kg)  BMI 39.44 kg/m2       Objective:   Physical Exam  Nursing note and vitals reviewed. Constitutional: She appears well-developed and well-nourished. No distress.  HENT:  Head: Normocephalic and atraumatic.  Right Ear: External ear normal.  Left Ear: External ear normal.  Nose: Nose normal.  Mouth/Throat: Oropharynx is clear and moist.  Eyes: Conjunctivae and EOM are normal. Pupils are equal, round, and reactive to light.  Neck: Normal range of motion. Neck supple. No JVD present. No tracheal deviation present. No thyromegaly present.  Cardiovascular: Normal rate, regular rhythm and intact distal pulses.   Murmur heard. Pulmonary/Chest: Effort normal and breath sounds normal. She has no wheezes. She exhibits no tenderness.  Abdominal: Soft. Bowel sounds are normal.  Musculoskeletal: She exhibits no edema and no tenderness.  Lymphadenopathy:    She has no cervical adenopathy.  Neurological: She has  normal reflexes. No cranial nerve deficit.  Skin: She is not diaphoretic.          Assessment & Plan:  Has retired and has stoped exercising Listless Volunteering But has loss of energy Surgery in Feb for rotator cuff

## 2012-08-17 ENCOUNTER — Ambulatory Visit: Payer: Medicare Other | Admitting: Rehabilitation

## 2012-08-17 ENCOUNTER — Ambulatory Visit (INDEPENDENT_AMBULATORY_CARE_PROVIDER_SITE_OTHER): Payer: Medicare Other | Admitting: Family Medicine

## 2012-08-17 VITALS — BP 172/82 | HR 86 | Temp 98.8°F | Resp 16 | Ht 65.0 in | Wt 239.0 lb

## 2012-08-17 DIAGNOSIS — L255 Unspecified contact dermatitis due to plants, except food: Secondary | ICD-10-CM

## 2012-08-17 DIAGNOSIS — L237 Allergic contact dermatitis due to plants, except food: Secondary | ICD-10-CM

## 2012-08-17 DIAGNOSIS — E119 Type 2 diabetes mellitus without complications: Secondary | ICD-10-CM

## 2012-08-17 MED ORDER — METHYLPREDNISOLONE ACETATE 80 MG/ML IJ SUSP
80.0000 mg | Freq: Once | INTRAMUSCULAR | Status: AC
  Administered 2012-08-17: 80 mg via INTRAMUSCULAR

## 2012-08-17 NOTE — Progress Notes (Signed)
67 yo diabetic woman whose sugars have been poorly controlled for a month.  She has been treated by Dr. Lovell Sheehan who changed her medicine but the sugar elevation persists up to 239 today.  She is taking saxagliptin-metformin and 65 units Lantus daily.  She has gained 5# over the last month (deaths in family, family eating more); ankles swollen more  Presents with left antecubital rash x 5 days which is worsening despite the kenalog cream prescribed.  There are now vesicles and increasing pruritus.    We discussed the chronically elevated sugar face to face for 35 minutes with husband   Objective:  NAD Large vesicular patch on left arm  Assessment:  Poison ivy, uncontrolled diabetes  Poison ivy  Type 2 diabetes mellitus - Plan: Ambulatory referral to Endocrinology  Take the humalog 10 tid with meals and lantus 20 units qhs Change diet as directed  Signed, Elvina Sidle, MD

## 2012-08-17 NOTE — Patient Instructions (Addendum)
1. Never eat after 7 pm 2. Avoid diet drinks 3. Avoid sweet drinks 4. Substitute savory spices for sugar.  Try humalog 10 units with each meal and Lantus 20 units daily   [employer]   Try making a salad with arugula lettuce, fennel, parmesan cheese, olive oil and lemon juice

## 2012-08-19 ENCOUNTER — Ambulatory Visit: Payer: Medicare Other | Admitting: Physical Therapy

## 2012-08-20 ENCOUNTER — Ambulatory Visit (INDEPENDENT_AMBULATORY_CARE_PROVIDER_SITE_OTHER): Payer: Medicare Other | Admitting: Family Medicine

## 2012-08-20 VITALS — BP 149/80 | HR 77 | Temp 97.9°F | Resp 16 | Ht 65.5 in | Wt 236.0 lb

## 2012-08-20 DIAGNOSIS — L237 Allergic contact dermatitis due to plants, except food: Secondary | ICD-10-CM

## 2012-08-20 DIAGNOSIS — L255 Unspecified contact dermatitis due to plants, except food: Secondary | ICD-10-CM

## 2012-08-20 DIAGNOSIS — E119 Type 2 diabetes mellitus without complications: Secondary | ICD-10-CM

## 2012-08-20 MED ORDER — CLOBETASOL PROPIONATE 0.05 % EX OINT: TOPICAL_OINTMENT | Freq: Two times a day (BID) | CUTANEOUS | Status: DC

## 2012-08-20 MED ORDER — METHYLPREDNISOLONE ACETATE 80 MG/ML IJ SUSP
80.0000 mg | Freq: Once | INTRAMUSCULAR | Status: AC
  Administered 2012-08-20: 80 mg via INTRAMUSCULAR

## 2012-08-20 NOTE — Patient Instructions (Addendum)
The depo-medrol is going to continue to send your sugars up.  Over the next 3-4d, start taking lantus 40u qhs and increase your humalog to 20u before each meal.  Hopefully in 3d (on 5/25), you will be able to half the dose again to what you were previously on by Dr. Milus Glazier.  If you sugars are to high or to low, call immed.  Continue using the clobetasol for the next 10d (through 08/31/12) along with topical calamine lotion and cool compresses.  Make sure you aren't spreading it when you put on lotions and that you are washing all clothing, washclothes, rags, etc that touch it without reusing them.  Consider keeping it covered with a breathable material while you sleep.  Poison [employer] ivy is a inflammation of the skin (contact dermatitis) caused by touching the allergens on the leaves of the ivy plant following previous exposure to the plant. The rash usually appears 48 hours after exposure. The rash is usually bumps (papules) or blisters (vesicles) in a linear pattern. Depending on your own sensitivity, the rash may simply cause redness and itching, or it may also progress to blisters which may break open. These must be well cared for to prevent secondary bacterial (germ) infection, followed by scarring. Keep any open areas dry, clean, dressed, and covered with an antibacterial ointment if needed. The eyes may also get puffy. The puffiness is worst in the morning and gets better as the day progresses. This dermatitis usually heals without scarring, within 2 to 3 weeks without treatment. HOME CARE INSTRUCTIONS  Thoroughly wash with soap and water as soon as you have been exposed to poison ivy. You have about one half hour to remove the plant resin before it will cause the rash. This washing will destroy the oil or antigen on the skin that is causing, or will cause, the rash. Be sure to wash under your fingernails as any plant resin there will continue to spread the rash. Do not rub skin vigorously when  washing affected area. Poison ivy cannot spread if no oil from the plant remains on your body. A rash that has progressed to weeping sores will not spread the rash unless you have not washed thoroughly. It is also important to wash any clothes you have been wearing as these may carry active allergens. The rash will return if you wear the unwashed clothing, even several days later. Avoidance of the plant in the future is the best measure. Poison ivy plant can be recognized by the number of leaves. Generally, poison ivy has three leaves with flowering branches on a single stem. Diphenhydramine may be purchased over the counter and used as needed for itching. Do not drive with this medication if it makes you drowsy.Ask your caregiver about medication for children. SEEK MEDICAL CARE IF:  Open sores develop.  Redness spreads beyond area of rash.  You notice purulent (pus-like) discharge.  You have increased pain.  Other signs of infection develop (such as fever). Document Released: 03/15/2000 Document Revised: 06/10/2011 Document Reviewed: 02/01/2009 [facility] Patient Information 2014 [place], [place].  Diabetes and Sick Day Management Blood sugar (glucose) can be more difficult to control when you are sick. Colds, fever, flu, nausea, vomiting, and diarrhea are all examples of common illnesses that can cause problems for people with diabetes. Loss of body fluids (dehydration) from fever, vomiting, diarrhea, infection, and the stress of a sickness can all cause blood glucose levels to rise. Because of this, it is very important to  take your diabetes medicines and to eat some form of carbohydrate food when you are sick. Liquid or soft foods are often tolerated, and they help to replace fluids. HOME CARE INSTRUCTIONS These main guidelines are intended for managing a short-term (24 hours or less) sickness:  Take your usual dose of insulin or oral diabetes medicine. An exception would be if you take any  form of metformin. If you cannot eat or drink, you can become dehydrated and should not take this medicine.  Continue to take your insulin even if you are unable to eat solid foods or are vomiting. Your insulin dose may stay the same, or it may need to be increased when you are sick.  You will need to test your blood glucose more often, generally every 2 4 hours. If you have type 1 diabetes, test your urine for ketones every 4 hours. If you have type 2 diabetes, test your urine for ketones as directed by your caregiver.  Eat some form of food that contains carbohydrates. The carbohydrates can be in solid or liquid form. You should eat 45 50 grams of carbohydrates every 3 4 hours.  Replace fluids if fever, vomiting, or diarrhea is present. Ask your caregiver for specific rehydration instructions.  Watch carefully for the signs of ketoacidosis if you have type 1 diabetes. Call your caregiver if any of the following symptoms are present, especially in children:  Moderate to large ketones in the urine along with a high blood glucose level.  Severe nausea.  Vomiting.  Diarrhea.  Abdominal pain.  Rapid breathing.  Drink extra liquids that do not contain sugar, such as water or sugar-free liquids, if your blood glucose is higher than 240 mg/dl.  Be careful with over-the-counter medicines. Read the labels. They may contain sugar or types of sugars that can raise your blood glucose. Food Choices for Illness All of the food choices below contain around 15 grams of carbohydrates. Plan ahead and keep some of these foods around.    to  cup carbonated beverage containing sugar. Carbonated beverages will usually be better tolerated if they are opened and left at room temperature for a few minutes.   of a twin frozen ice pop.   cup sweetened gelatin dessert.   cup juice.   cup ice cream or frozen yogurt.   cup cooked cereal.   cup sherbet.  1 cup broth-based soup with noodles or  rice, reconstituted with water.  1 cup cream soup.   cup regular custard.   cup regular pudding.  1 cup sports drink.  1 cup plain yogurt.  1 slice toast.  6 squares saltine crackers.  5 vanilla wafers. SEEK MEDICAL CARE IF:   You are unable to eat food or drink fluids for more than 6 hours.  You have nausea and vomiting for more than 6 hours.  Your blood glucose level is over 240 mg/dL.  There is a change in mental status.  You develop an additional serious illness.  You have diarrhea for more than 6 hours.  You have been sick or have had a fever for a couple of days and are not getting better. SEEK IMMEDIATE MEDICAL CARE IF:  You have difficulty breathing.  You have moderate to large ketone levels. Document Released: 03/21/2003 Document Revised: 12/11/2011 Document Reviewed: 09/25/2010 [facility] Patient Information 2014 [place], [place].

## 2012-08-20 NOTE — Progress Notes (Signed)
Subjective:    Patient ID: Marissa Roberts, female    DOB: 1945/05/11, 67 y.o.   MRN: [id number] Chief Complaint  Patient presents with  . Follow-up    poison ivy left arm    HPI Ms. Gibeault is here to follow up on her DM. Was initially getting better after haivng the IM DepoMedrol 4d ago but now has started to spread again.  Initially tried a topical steroid but it didn't help at all.  Is mainly on her left antecubital area.  Pt reports her PCP Dr. Lovell Sheehan had changed her DM med - added in saxagliptin metformin 24 hr - from metformin 850 twice a day. Then saw Dr. Alesia Morin prev who cut her lantus down from 65u to 20u and than added in humalol10u with every meal - she is using the humalog 2 times a day - cbgs 324, yesterday morning 10:43, [phone], yesterday she checked it during the day and was high constantly.  Nothing <200. Has been referred to Dr. Enzo Montgomery for DM management but no appt 6/24.    Past Medical History  Diagnosis Date  . Hypertension   . Thyroid disease   . Diabetes mellitus   . CHF (congestive heart failure)   . PONV (postoperative nausea and vomiting)   . Hypothyroidism   . Pneumonia     hx of  . Bronchitis     hx of  . GERD (gastroesophageal reflux disease)   . Arthritis   . Hypercholesteremia    Current Outpatient Prescriptions on File Prior to Visit  Medication Sig Dispense Refill  . amLODipine-benazepril (LOTREL) 5-40 MG per capsule Take 1 capsule by mouth daily.  30 capsule  5  . Calcium Carbonate (CALCIUM 600) 1500 MG TABS Take 2 tablets by mouth daily.       Marland Kitchen ezetimibe-simvastatin (VYTORIN) 10-20 MG per tablet Take 0.5 tablets by mouth at bedtime. 1/2 tab daily       . FREESTYLE LITE test strip USE TWICE DAILY TO TEST BLOOD SUGAR  100 each  4  . furosemide (LASIX) 40 MG tablet Take 80 mg by mouth 2 (two) times daily.      Marland Kitchen glucosamine-chondroitin 500-400 MG tablet Take 1 tablet by mouth daily.        . insulin glargine (LANTUS) 100 UNIT/ML  injection Inject 0.55 mLs (55 Units total) into the skin at bedtime.  10 mL  3  . Insulin Pen Needle (B-D ULTRAFINE III SHORT PEN) 31G X 8 MM MISC 1 each by Does not apply route daily. To use with lantus  100 each  3  . omeprazole (PRILOSEC) 20 MG capsule Take 40 mg by mouth daily.       . potassium chloride (K-DUR) 10 MEQ tablet Take 10 mEq by mouth daily.      . Saxagliptin-Metformin 07-998 MG TB24 Take 1 tablet by mouth daily.  30 tablet     No current facility-administered medications on file prior to visit.   Allergies  Allergen Reactions  . Contrast Media (Iodinated Diagnostic Agents)     Throat swells from ct con tra  . Adhesive (Tape)     No paper tape, causes blisters  . Erythromycin     REACTION: rash  . Penicillins     REACTION: rash     Review of Systems  Constitutional: Positive for fatigue. Negative for fever, chills and diaphoresis.  Musculoskeletal: Positive for joint swelling, arthralgias and gait problem.  Skin: Positive for color  change and rash. Negative for pallor and wound.  Neurological: Positive for weakness.  Hematological: Bruises/bleeds easily.  Psychiatric/Behavioral: Positive for sleep disturbance.      BP 149/80  Pulse 77  Temp(Src) 97.9 F (36.6 C) (Oral)  Resp 16  Ht 5' 5.5" (1.664 m)  Wt 236 lb (107.049 kg)  BMI 38.66 kg/m2 Objective:   Physical Exam  Constitutional: She is oriented to person, place, and time. She appears well-developed and well-nourished. No distress.  HENT:  Head: Normocephalic and atraumatic.  Right Ear: External ear normal.  Left Ear: External ear normal.  Eyes: Conjunctivae are normal. No scleral icterus.  Neck: Normal range of motion. Neck supple. No thyromegaly present.  Cardiovascular: Normal rate, regular rhythm, normal heart sounds and intact distal pulses.   Pulmonary/Chest: Effort normal and breath sounds normal. No respiratory distress.  Musculoskeletal: She exhibits no edema.  Lymphadenopathy:    She  has no cervical adenopathy.  Neurological: She is alert and oriented to person, place, and time.  Skin: Skin is warm and dry. Rash noted. Rash is maculopapular and vesicular. She is not diaphoretic. No erythema.     Psychiatric: She has a normal mood and affect. Her behavior is normal.         Assessment & Plan:  Poison ivy - Plan: methylPREDNISolone acetate (DEPO-MEDROL) injection 80 mg - will repeat depomedrol in x 1 due to continued spread but warned of hyperglycemia and reviewed topical treatments and care in detail so that no further steroid use will be needed. Cool compresses, calamine, top clobetasol, pat on - don't rub, change all towels, bed clothing, cloths, etc.  Keep covered to avoid spread.  DIABETES MELLITUS, TYPE II - will have to temporarily increase insulin to lantus 40u qhs and humalog to 20u before each meal.  Hopefully in 3d (on 5/25), pt can half the dosages back to prior 20u qhs w/ 10u tidac per Dr. Milus Glazier.  If sugars are to high or to low, call immed.  Keep appt w/ endocrine in June.  Meds ordered this encounter  Medications  . methylPREDNISolone acetate (DEPO-MEDROL) injection 80 mg    Sig:   . clobetasol ointment (TEMOVATE) 0.05 %    Sig: Apply topically 2 (two) times daily.    Dispense:  60 g    Refill:  1

## 2012-08-25 ENCOUNTER — Ambulatory Visit: Payer: Medicare Other | Admitting: Physical Therapy

## 2012-08-25 ENCOUNTER — Other Ambulatory Visit: Payer: Self-pay | Admitting: Internal Medicine

## 2012-08-27 ENCOUNTER — Ambulatory Visit: Payer: Medicare Other | Admitting: Physical Therapy

## 2012-09-07 ENCOUNTER — Ambulatory Visit: Payer: Medicare Other | Attending: Orthopedic Surgery | Admitting: Rehabilitation

## 2012-09-07 DIAGNOSIS — M25619 Stiffness of unspecified shoulder, not elsewhere classified: Secondary | ICD-10-CM | POA: Insufficient documentation

## 2012-09-07 DIAGNOSIS — M25519 Pain in unspecified shoulder: Secondary | ICD-10-CM | POA: Insufficient documentation

## 2012-09-07 DIAGNOSIS — IMO0001 Reserved for inherently not codable concepts without codable children: Secondary | ICD-10-CM | POA: Insufficient documentation

## 2012-09-09 ENCOUNTER — Ambulatory Visit: Payer: Medicare Other | Admitting: Physical Therapy

## 2012-09-24 ENCOUNTER — Other Ambulatory Visit: Payer: Self-pay | Admitting: *Deleted

## 2012-09-24 MED ORDER — LEVOTHYROXINE SODIUM 137 MCG PO TABS: ORAL_TABLET | ORAL | Status: DC

## 2012-10-21 ENCOUNTER — Telehealth: Payer: Self-pay | Admitting: Internal Medicine

## 2012-10-21 NOTE — Telephone Encounter (Signed)
Pt informed ,samples up front for pt to pick up

## 2012-10-21 NOTE — Telephone Encounter (Signed)
PT is calling to request samples of Kombiglyze 5mg -1000mg . Please assist.

## 2012-11-03 ENCOUNTER — Encounter: Payer: Medicare Other | Attending: Endocrinology | Admitting: *Deleted

## 2012-11-03 ENCOUNTER — Encounter: Payer: Self-pay | Admitting: *Deleted

## 2012-11-03 VITALS — Ht 65.0 in | Wt 235.9 lb

## 2012-11-03 DIAGNOSIS — Z713 Dietary counseling and surveillance: Secondary | ICD-10-CM | POA: Insufficient documentation

## 2012-11-03 DIAGNOSIS — E119 Type 2 diabetes mellitus without complications: Secondary | ICD-10-CM | POA: Insufficient documentation

## 2012-11-03 NOTE — Progress Notes (Signed)
Appt start time: 1200 end time:  1300.  Assessment:  Patient was seen on  11/03/2012 for individual diabetes education for carb counting. She lives husband, she shops and meal preparation. SMBG every AM with reported range of 129 - 200 or more. She states her BG are better since she started on Humalog. She takes that before meals unless she eats out, then she takes it when she comes home after the meal. She cooks some meals at home but they eat out often too.   Current HbA1c: 9.1%  MEDICATIONS: see list, Lantus, Humalog before meals, Komglyza XR   DIETARY INTAKE:  Usual eating pattern includes 3 meals and 2 snacks per day.  Everyday foods include variety of all food groups.  Avoided foods include: none stated.    24-hr recall:  B (9 AM): lately, 2 boiled eggs and 2 toast with Country Crock OR Raisin Bran or other unsweetened cereal with 1% milk, coffee with Sweet and Low and Coffee Mate Snk ( AM): individual bag of Cheese Nips crackers OR pork skins OR cookies L (2 PM): eat out often while running errands; hamburger and fries OR chicken strips and fries, sweet tea Snk ( PM): same as AM D (7-10 PM): meat, starch, vegetable type meal, bread or roll if eating out, sweet tea Snk ( PM): not usually Beverages: coffee, sweet tea  Usual physical activity: not right now  Estimated energy needs: 1400 calories 158 g carbohydrates 105 g protein 39 g fat  Progress Towards Goal(s):  In progress.   Nutritional Diagnosis:  NB-1.1 Food and nutrition-related knowledge deficit As related to diabetes control.  As evidenced by A1c of 9.1%.    Intervention:  Nutrition counseling provided.  Discussed diabetes disease process and treatment options.  Discussed physiology of diabetes and role of obesity on insulin resistance.  Encouraged moderate weight reduction to improve glucose levels.  Discussed role of medications and diet in glucose control  Provided education on macronutrients on glucose levels.   Provided education on carb counting, importance of regularly scheduled meals/snacks, and meal planning  Discussed effects of physical activity on glucose levels and long-term glucose control.  Recommended 150 minutes of physical activity/week.  Reviewed patient medications.  Discussed role of medication on blood glucose and possible side effects  Discussed blood glucose monitoring and interpretation.  Discussed recommended target ranges and individual ranges.    Handouts given during visit include: Living Well with Diabetes Carb Counting and Food Label handouts Meal Plan Card  Barriers to learning/adherance to lifestyle change: preference for sugar sweetened beverages  Diabetes self-care support plan:  Aim for 3 Carb Choices per meal (45 grams) +/- 1 either way  Aim for 0-2 Carbs per snack if hungry  Consider reading food labels for Total Carbohydrate of foods Consider  increasing your activity level daily as tolerated Consider checking BG at alternate times per day and after you exercise as directed by MD   Monitoring/Evaluation:  Dietary intake, exercise, reading food labels, and body weight in 6 week(s).

## 2012-11-03 NOTE — Patient Instructions (Signed)
Plan:  Aim for 3 Carb Choices per meal (45 grams) +/- 1 either way  Aim for 0-2 Carbs per snack if hungry  Consider reading food labels for Total Carbohydrate of foods Consider  increasing your activity level daily as tolerated Consider checking BG at alternate times per day and after you exercise as directed by MD

## 2012-11-16 ENCOUNTER — Ambulatory Visit (INDEPENDENT_AMBULATORY_CARE_PROVIDER_SITE_OTHER): Payer: BLUE CROSS/BLUE SHIELD | Admitting: Internal Medicine

## 2012-11-16 ENCOUNTER — Encounter: Payer: Self-pay | Admitting: Internal Medicine

## 2012-11-16 VITALS — BP 144/80 | HR 80 | Temp 98.2°F | Resp 16 | Ht 65.0 in | Wt 242.0 lb

## 2012-11-16 DIAGNOSIS — E1165 Type 2 diabetes mellitus with hyperglycemia: Secondary | ICD-10-CM

## 2012-11-16 DIAGNOSIS — IMO0002 Reserved for concepts with insufficient information to code with codable children: Secondary | ICD-10-CM

## 2012-11-16 DIAGNOSIS — I10 Essential (primary) hypertension: Secondary | ICD-10-CM

## 2012-11-16 NOTE — Patient Instructions (Signed)
The church nights have a small snack before you go to church and a light meal after church

## 2012-11-16 NOTE — Progress Notes (Signed)
Subjective:    Patient ID: Marissa Roberts, female    DOB: 1945/08/29, 67 y.o.   MRN: [id number]  HPI Weight gain Increased CBG's Increased HTN Patient admits that on church night so much a week she is eating late and overeating at that time.  She has an appointment with the dietitian in September.  We discussed portion control exercise has been keeping controlled with her blood pressure and her diabetes    Review of Systems  Constitutional: Negative for activity change, appetite change and fatigue.  HENT: Negative for ear pain, congestion, neck pain, postnasal drip and sinus pressure.   Eyes: Negative for redness and visual disturbance.  Respiratory: Negative for cough, shortness of breath and wheezing.   Gastrointestinal: Negative for abdominal pain and abdominal distention.  Genitourinary: Negative for dysuria, frequency and menstrual problem.  Musculoskeletal: Negative for myalgias, joint swelling and arthralgias.  Skin: Negative for rash and wound.  Neurological: Negative for dizziness, weakness and headaches.  Hematological: Negative for adenopathy. Does not bruise/bleed easily.  Psychiatric/Behavioral: Negative for sleep disturbance and decreased concentration.   Past Medical History  Diagnosis Date  . Hypertension   . Thyroid disease   . Diabetes mellitus   . CHF (congestive heart failure)   . PONV (postoperative nausea and vomiting)   . Hypothyroidism   . Pneumonia     hx of  . Bronchitis     hx of  . GERD (gastroesophageal reflux disease)   . Arthritis   . Hypercholesteremia     History   Social History  . Marital Status: Married    Spouse Name: N/A    Number of Children: N/A  . Years of Education: N/A   Occupational History  . Not on file.   Social History Main Topics  . Smoking status: Former Smoker    Quit date: 04/01/1990  . Smokeless tobacco: Not on file  . Alcohol Use: No  . Drug Use: No  . Sexual Activity: Not on file   Other Topics  Concern  . Not on file   Social History Narrative  . No narrative on file    Past Surgical History  Procedure Laterality Date  . Knee arhtroscopy    . Carpel tunnel    . Foot surgery    . Abdominal hysterectomy      partial  . Tonsillectomy    . Breast surgery      lumpectomy left breast  . Colonoscopy w/ polypectomy    . Shoulder arthroscopy with subacromial decompression, rotator cuff repair and bicep tendon repair Right 05/26/2012    Procedure: Right Shoulder Diagnostic Operative Arthroscopy, Subacromial Decompression, Biceps Tenodesis, Mini Open Rotator Cuff Repair;  Surgeon: Cammy Copa, MD;  Location: [facility] OR;  Service: Orthopedics;  Laterality: Right;  Right Shoulder Diagnostic Operative Arthroscopy, Subacromial Decompression, Biceps Tenodesis, Mini Open Rotator Cuff Repair    Family History  Problem Relation Age of Onset  . Adopted: Yes  . Coronary artery disease    . Diabetes Mother   . Heart disease Mother   . Hyperlipidemia Mother     Allergies  Allergen Reactions  . Contrast Media [Iodinated Diagnostic Agents]     Throat swells from ct con tra  . Adhesive [Tape]     No paper tape, causes blisters  . Erythromycin     REACTION: rash  . Penicillins     REACTION: rash    Current Outpatient Prescriptions on File Prior to Visit  Medication Sig Dispense Refill  .  amLODipine-benazepril (LOTREL) 5-40 MG per capsule Take 1 capsule by mouth daily.  30 capsule  5  . Calcium Carbonate (CALCIUM 600) 1500 MG TABS Take 2 tablets by mouth daily.       . clobetasol ointment (TEMOVATE) 0.05 % Apply topically 2 (two) times daily.  60 g  1  . ezetimibe-simvastatin (VYTORIN) 10-20 MG per tablet Take 0.5 tablets by mouth at bedtime. 1/2 tab daily       . FREESTYLE LITE test strip USE TWICE DAILY TO TEST BLOOD SUGAR  100 each  4  . furosemide (LASIX) 40 MG tablet Take 80 mg by mouth 2 (two) times daily.      Marland Kitchen glucosamine-chondroitin 500-400 MG tablet Take 1 tablet by  mouth daily.        . insulin glargine (LANTUS) 100 UNIT/ML injection Inject 0.55 mLs (55 Units total) into the skin at bedtime.  10 mL  3  . insulin lispro (HUMALOG) 100 UNIT/ML injection Inject 10 Units into the skin 3 (three) times daily before meals.      . Insulin Pen Needle (B-D ULTRAFINE III SHORT PEN) 31G X 8 MM MISC 1 each by Does not apply route daily. To use with lantus  100 each  3  . levothyroxine (SYNTHROID, LEVOTHROID) 137 MCG tablet TAKE ONE TABLET BY MOUTH ONCE DAILY  90 tablet  3  . omeprazole (PRILOSEC) 20 MG capsule Take 40 mg by mouth daily.       . potassium chloride (K-DUR) 10 MEQ tablet Take 10 mEq by mouth daily.      . Saxagliptin-Metformin 07-998 MG TB24 Take 1 tablet by mouth daily.  30 tablet     No current facility-administered medications on file prior to visit.    BP 144/80  Pulse 80  Temp(Src) 98.2 F (36.8 C)  Resp 16  Ht 5\' 5"  (1.651 m)  Wt 242 lb (109.77 kg)  BMI 40.27 kg/m2       Objective:   Physical Exam  Constitutional: She is oriented to person, place, and time. She appears well-developed and well-nourished. No distress.  HENT:  Head: Normocephalic and atraumatic.  Right Ear: External ear normal.  Left Ear: External ear normal.  Nose: Nose normal.  Mouth/Throat: Oropharynx is clear and moist.  Eyes: Conjunctivae and EOM are normal. Pupils are equal, round, and reactive to light.  Neck: Normal range of motion. Neck supple. No JVD present. No tracheal deviation present. No thyromegaly present.  Cardiovascular: Normal rate, regular rhythm and intact distal pulses.   Murmur heard. Pulmonary/Chest: Effort normal and breath sounds normal. She has no wheezes. She exhibits no tenderness.  Abdominal: Soft. Bowel sounds are normal.  Musculoskeletal: Normal range of motion. She exhibits no edema and no tenderness.  Lymphadenopathy:    She has no cervical adenopathy.  Neurological: She is alert and oriented to person, place, and time. She has  normal reflexes. No cranial nerve deficit.  Skin: Skin is warm and dry. She is not diaphoretic.  Psychiatric: She has a normal mood and affect. Her behavior is normal.          Assessment & Plan:  Key to weight loss is watching portions and exercise.  When we have to eat late eating a lighter meal and we normally would eat Not monitor hemoglobin A1c today do to the noncompliance with the diet is obviously will be elevated based upon his CBGs he presents her CBGs ranged from 160-270.  We'll put her on a strict  weight loss program to follow the diet program exercise program and when she comes back in January we'll monitor hemoglobin A1c lipid panel at that time

## 2012-12-15 ENCOUNTER — Encounter: Payer: Self-pay | Admitting: *Deleted

## 2012-12-15 ENCOUNTER — Encounter: Payer: Medicare Other | Attending: Endocrinology | Admitting: *Deleted

## 2012-12-15 VITALS — Ht 65.0 in | Wt 236.6 lb

## 2012-12-15 DIAGNOSIS — Z713 Dietary counseling and surveillance: Secondary | ICD-10-CM | POA: Insufficient documentation

## 2012-12-15 DIAGNOSIS — E119 Type 2 diabetes mellitus without complications: Secondary | ICD-10-CM

## 2012-12-15 NOTE — Patient Instructions (Signed)
Diabetes self-care support plan:  Aim for 3 Carb Choices per meal (45 grams) +/- 1 either way  Aim for 0-2 Carbs per snack if hungry  Consider reading food labels for Total Carbohydrate of foods Consider  increasing your activity level by walking daily as tolerated Continue checking BG at alternate times per day and after you exercise as directed by MD

## 2012-12-15 NOTE — Progress Notes (Signed)
Appt start time: 1030 end time:  1100.  Assessment:  Patient was seen on  12/15/2012 for individual diabetes education for carb counting follow up visit. She states her BG is averaging around 120 mg/dl with range from 16-109 mg/dl. She is taking Humalog before meals typically unless away from home. She states that her normal eating habits fit into Carb Counting plan really well. She is more aware of carb containing foods and is modifying those portions better now. He husband has been ill since the end of August and had to have a pacemaker and defibrillator implanted. She has been at hospital and nursing home much of the time spending time with him, so her normal schedule is affected. He is doing better and she hopes to resume walking daily once he comes home.  Current HbA1c: 9.1%  MEDICATIONS: see list, Lantus, Humalog before meals, Komglyza XR   DIETARY INTAKE:  Usual eating pattern includes 3 meals and 2 snacks per day.  Everyday foods include variety of all food groups.  Avoided foods include: none stated.    24-hr recall:  B (9 AM): lately, 2 boiled eggs and 2 toast with Country Crock OR Raisin Bran or other unsweetened cereal with 1% milk, coffee with Sweet and Low and Coffee Mate Snk ( AM): individual bag of Cheese Nips crackers OR pork skins OR cookies L (2 PM): eat out often while running errands; hamburger and fries OR chicken strips and fries, sweet tea Snk ( PM): same as AM D (7-10 PM): meat, starch, vegetable type meal, bread or roll if eating out, sweet tea Snk ( PM): not usually Beverages: coffee, sweet tea  Usual physical activity: not right now  Estimated energy needs: 1400 calories 158 g carbohydrates 105 g protein 39 g fat  Progress Towards Goal(s):  In progress.   Nutritional Diagnosis:  NB-1.1 Food and nutrition-related knowledge deficit As related to diabetes control.  As evidenced by A1c of 9.1%.    Intervention:  Nutrition counseling provided. Commended her  on her improved BG control and her regular checking of her BG. Encouraged her to resume her walking once her husband gets home. Offered information on low sodium seasonings per her request.  Handouts given during visit include: Low Sodium Seasonings handout  Barriers to learning/adherance to lifestyle change: preference for sugar sweetened beverages  Diabetes self-care support plan:  Continue to aim for 3 Carb Choices per meal (45 grams) +/- 1 either way  Continue to aim for 0-2 Carbs per snack if hungry  Consider reading food labels for Total Carbohydrate of foods Consider  increasing your activity level by walking daily as tolerated Continue checking BG at alternate times per day and after you exercise as directed by MD   Monitoring/Evaluation:  Dietary intake, exercise, reading food labels, and body weight as needed. Patient to call for appointment after her husband is feeling better.

## 2013-02-15 ENCOUNTER — Ambulatory Visit: Payer: Medicare Other | Admitting: Podiatry

## 2013-02-19 ENCOUNTER — Other Ambulatory Visit: Payer: Self-pay | Admitting: Internal Medicine

## 2013-02-27 ENCOUNTER — Other Ambulatory Visit: Payer: Self-pay | Admitting: Internal Medicine

## 2013-03-08 ENCOUNTER — Encounter: Payer: Self-pay | Admitting: Podiatry

## 2013-03-08 ENCOUNTER — Ambulatory Visit (INDEPENDENT_AMBULATORY_CARE_PROVIDER_SITE_OTHER): Payer: Medicare Other | Admitting: Podiatry

## 2013-03-08 VITALS — BP 167/90 | HR 76 | Resp 20 | Ht 64.0 in | Wt 237.0 lb

## 2013-03-08 DIAGNOSIS — B351 Tinea unguium: Secondary | ICD-10-CM

## 2013-03-08 DIAGNOSIS — M79609 Pain in unspecified limb: Secondary | ICD-10-CM

## 2013-03-08 NOTE — Progress Notes (Signed)
Patient ID: Marissa Roberts, female   DOB: [birth year], 67 y.o.   MRN: [id number] Subjective: This 67 year old black female presents for ongoing debridement of painful mycotic toenails and a chronic keratoses on the fifth right toe. She is diabetic and has undergone hammertoe arthroplasty on the fifth right toe with a persistent lesion on the toe. She has been a patient is practice since 1996.  Objective: Hypertrophic, brittle, discolored toenails x10 noted. 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 Reappoint at three-month intervals

## 2013-03-29 ENCOUNTER — Other Ambulatory Visit: Payer: Self-pay | Admitting: Internal Medicine

## 2013-03-30 ENCOUNTER — Ambulatory Visit (INDEPENDENT_AMBULATORY_CARE_PROVIDER_SITE_OTHER): Payer: Medicare Other | Admitting: Family

## 2013-03-30 ENCOUNTER — Encounter: Payer: Self-pay | Admitting: Family

## 2013-03-30 VITALS — BP 158/80 | Temp 98.1°F | Resp 82 | Wt 240.0 lb

## 2013-03-30 DIAGNOSIS — R252 Cramp and spasm: Secondary | ICD-10-CM

## 2013-03-30 DIAGNOSIS — I10 Essential (primary) hypertension: Secondary | ICD-10-CM

## 2013-03-30 DIAGNOSIS — E876 Hypokalemia: Secondary | ICD-10-CM

## 2013-03-30 LAB — POCT URINALYSIS DIPSTICK
Bilirubin, UA: NEGATIVE
Ketones, UA: NEGATIVE
Leukocytes, UA: NEGATIVE
Spec Grav, UA: <=1.005
pH, UA: 6

## 2013-03-30 LAB — BASIC METABOLIC PANEL
BUN: 8 mg/dL (ref 6–23)
CO2: 29 mEq/L (ref 19–32)
Chloride: 101 mEq/L (ref 96–112)
Creatinine, Ser: 0.8 mg/dL (ref 0.4–1.2)
Glucose, Bld: 95 mg/dL (ref 70–99)
Potassium: 3.3 mEq/L — ABNORMAL LOW (ref 3.5–5.1)

## 2013-03-30 NOTE — Progress Notes (Signed)
Pre visit review using our clinic review tool, if applicable. No additional management support is needed unless otherwise documented below in the visit note. 

## 2013-03-30 NOTE — Patient Instructions (Signed)
°  Muscle Cramps and Spasms °Muscle cramps and spasms occur when a muscle or muscles tighten and you have no control over this tightening (involuntary muscle contraction). They are a common problem and can develop in any muscle. The most common place is in the calf muscles of the leg. Both muscle cramps and muscle spasms are involuntary muscle contractions, but they also have differences:  °· Muscle cramps are sporadic and painful. They may last a few seconds to a quarter of an hour. Muscle cramps are often more forceful and last longer than muscle spasms. °· Muscle spasms may or may not be painful. They may also last just a few seconds or much longer. °CAUSES  °It is uncommon for cramps or spasms to be due to a serious underlying problem. In many cases, the cause of cramps or spasms is unknown. Some common causes are:  °· Overexertion.   °· Overuse from repetitive motions (doing the same thing over and over).   °· Remaining in a certain position for a long period of time.   °· Improper preparation, form, or technique while performing a sport or activity.   °· Dehydration.   °· Injury.   °· Side effects of some medicines.   °· Abnormally low levels of the salts and ions in your blood (electrolytes), especially potassium and calcium. This could happen if you are taking water pills (diuretics) or you are pregnant.   °Some underlying medical problems can make it more likely to develop cramps or spasms. These include, but are not limited to:  °· Diabetes.   °· Parkinson disease.   °· Hormone disorders, such as thyroid problems.   °· Alcohol abuse.   °· Diseases specific to muscles, joints, and bones.   °· Blood vessel disease where not enough blood is getting to the muscles.   °HOME CARE INSTRUCTIONS  °· Stay well hydrated. Drink enough water and fluids to keep your urine clear or pale yellow. °· It may be helpful to massage, stretch, and relax the affected muscle. °· For tight or tense muscles, use a warm towel, heating  pad, or hot shower water directed to the affected area. °· If you are sore or have pain after a cramp or spasm, applying ice to the affected area may relieve discomfort. °· Put ice in a plastic bag. °· Place a towel between your skin and the bag. °· Leave the ice on for 15-20 minutes, 03-04 times a day. °· Medicines used to treat a known cause of cramps or spasms may help reduce their frequency or severity. Only take over-the-counter or prescription medicines as directed by your caregiver. °SEEK MEDICAL CARE IF:  °Your cramps or spasms get more severe, more frequent, or do not improve over time.  °MAKE SURE YOU:  °· Understand these instructions. °· Will watch your condition. °· Will get help right away if you are not doing well or get worse. °Document Released: 09/07/2001 Document Revised: 07/13/2012 Document Reviewed: 03/04/2012 °[employer]® Patient Information ©2014 [employer]. ° ° °

## 2013-03-30 NOTE — Progress Notes (Signed)
Subjective:    Patient ID: Marissa Roberts, female    DOB: 10/18/1945, 67 y.o.   MRN: [id number]  HPI 67 year old-American female, patient of Dr. Lovell Sheehan who presents today with complaints of cramps in her arms and legs that has been ongoing off and on x3-4 months.  currently is worse at night.She has a history of hypokalemia and is currently taking Lasix 40 mg daily.She has a history of type 2 diabetes and reports her blood sugars been 100-120 range fasting.    Review of Systems  Constitutional: Negative.   Respiratory: Negative.   Cardiovascular: Negative.  Negative for chest pain and leg swelling.  Gastrointestinal: Negative.   Genitourinary: Negative.   Musculoskeletal:       Extremity cramping  Skin: Negative.   Neurological: Negative.   Psychiatric/Behavioral: Negative.    Past Medical History  Diagnosis Date  . Hypertension   . Thyroid disease   . Diabetes mellitus   . CHF (congestive heart failure)   . PONV (postoperative nausea and vomiting)   . Hypothyroidism   . Pneumonia     hx of  . Bronchitis     hx of  . GERD (gastroesophageal reflux disease)   . Arthritis   . Hypercholesteremia     History   Social History  . Marital Status: Married    Spouse Name: N/A    Number of Children: N/A  . Years of Education: N/A   Occupational History  . Not on file.   Social History Main Topics  . Smoking status: Former Smoker    Quit date: 04/01/1990  . Smokeless tobacco: Never Used  . Alcohol Use: No  . Drug Use: No  . Sexual Activity: Not on file   Other Topics Concern  . Not on file   Social History Narrative  . No narrative on file    Past Surgical History  Procedure Laterality Date  . Knee arhtroscopy    . Carpel tunnel    . Foot surgery    . Abdominal hysterectomy      partial  . Tonsillectomy    . Breast surgery      lumpectomy left breast  . Colonoscopy w/ polypectomy    . Shoulder arthroscopy with subacromial decompression, rotator cuff  repair and bicep tendon repair Right 05/26/2012    Procedure: Right Shoulder Diagnostic Operative Arthroscopy, Subacromial Decompression, Biceps Tenodesis, Mini Open Rotator Cuff Repair;  Surgeon: Cammy Copa, MD;  Location: [facility] OR;  Service: Orthopedics;  Laterality: Right;  Right Shoulder Diagnostic Operative Arthroscopy, Subacromial Decompression, Biceps Tenodesis, Mini Open Rotator Cuff Repair    Family History  Problem Relation Age of Onset  . Adopted: Yes  . Coronary artery disease    . Diabetes Mother   . Heart disease Mother   . Hyperlipidemia Mother     Allergies  Allergen Reactions  . Contrast Media [Iodinated Diagnostic Agents]     Throat swells from ct con tra  . Adhesive [Tape]     No paper tape, causes blisters  . Erythromycin     REACTION: rash  . Penicillins     REACTION: rash    Current Outpatient Prescriptions on File Prior to Visit  Medication Sig Dispense Refill  . amLODipine-benazepril (LOTREL) 5-40 MG per capsule TAKE ONE CAPSULE BY MOUTH ONCE DAILY  30 capsule  11  . Calcium Carbonate (CALCIUM 600) 1500 MG TABS Take 2 tablets by mouth daily.       Marland Kitchen ezetimibe-simvastatin (VYTORIN) 10-20  MG per tablet Take 0.5 tablets by mouth at bedtime. 1/2 tab daily       . FREESTYLE LITE test strip USE TWICE DAILY TO TEST BLOOD SUGAR  100 each  4  . furosemide (LASIX) 40 MG tablet Take 80 mg by mouth 2 (two) times daily.      Marland Kitchen glucosamine-chondroitin 500-400 MG tablet Take 1 tablet by mouth daily.        . insulin glargine (LANTUS) 100 UNIT/ML injection Inject 0.55 mLs (55 Units total) into the skin at bedtime.  10 mL  3  . insulin lispro (HUMALOG) 100 UNIT/ML injection Inject 10 Units into the skin 3 (three) times daily before meals.      . Insulin Pen Needle (B-D ULTRAFINE III SHORT PEN) 31G X 8 MM MISC 1 each by Does not apply route daily. To use with lantus  100 each  3  . levothyroxine (SYNTHROID, LEVOTHROID) 137 MCG tablet TAKE ONE TABLET BY MOUTH ONCE DAILY   90 tablet  3  . omeprazole (PRILOSEC) 20 MG capsule Take 40 mg by mouth daily.       . potassium chloride (K-DUR) 10 MEQ tablet Take 10 mEq by mouth daily.      . Saxagliptin-Metformin 07-998 MG TB24 Take 1 tablet by mouth daily.  30 tablet    . clobetasol ointment (TEMOVATE) 0.05 % Apply topically 2 (two) times daily.  60 g  1  . potassium chloride (K-DUR) 10 MEQ tablet TAKE 1 TABLET DAILY  90 tablet  1   No current facility-administered medications on file prior to visit.    BP 158/80  Temp(Src) 98.1 F (36.7 C)  Resp 82  Wt 240 lb (108.863 kg)  SpO2 96%chart    Objective:   Physical Exam  Constitutional: She is oriented to person, place, and time. She appears well-developed and well-nourished.  Neck: Normal range of motion. Neck supple.  Cardiovascular: Normal rate, regular rhythm and normal heart sounds.   Pulmonary/Chest: Effort normal and breath sounds normal.  Musculoskeletal: Normal range of motion. She exhibits no edema and no tenderness.  Neurological: She is alert and oriented to person, place, and time.  Skin: Skin is dry.  Psychiatric: She has a normal mood and affect.          Assessment & Plan: 1.:  Assessment: 1. Extremity Cramping 2. Hypokalemia 3. Type 2 DM  Plan: BMP and UA sent today will notify patient of the results. Follow up with Dr. Lovell Sheehan as scheduled. We'll discuss further treatment plan pending labs.

## 2013-04-19 ENCOUNTER — Ambulatory Visit (INDEPENDENT_AMBULATORY_CARE_PROVIDER_SITE_OTHER): Payer: Medicare Other | Admitting: Internal Medicine

## 2013-04-19 ENCOUNTER — Encounter: Payer: Self-pay | Admitting: Internal Medicine

## 2013-04-19 VITALS — BP 140/88 | HR 80 | Temp 98.4°F | Resp 16 | Ht 64.0 in | Wt 241.0 lb

## 2013-04-19 DIAGNOSIS — E1165 Type 2 diabetes mellitus with hyperglycemia: Secondary | ICD-10-CM

## 2013-04-19 DIAGNOSIS — E119 Type 2 diabetes mellitus without complications: Secondary | ICD-10-CM

## 2013-04-19 DIAGNOSIS — I10 Essential (primary) hypertension: Secondary | ICD-10-CM

## 2013-04-19 DIAGNOSIS — IMO0001 Reserved for inherently not codable concepts without codable children: Secondary | ICD-10-CM

## 2013-04-19 DIAGNOSIS — E039 Hypothyroidism, unspecified: Secondary | ICD-10-CM

## 2013-04-19 LAB — BASIC METABOLIC PANEL
BUN: 9 mg/dL (ref 6–23)
CO2: 28 mEq/L (ref 19–32)
Calcium: 9.5 mg/dL (ref 8.4–10.5)
Chloride: 102 mEq/L (ref 96–112)
Creatinine, Ser: 0.7 mg/dL (ref 0.4–1.2)
GFR: 102.69 mL/min (ref >60.00)
Glucose, Bld: 115 mg/dL — ABNORMAL HIGH (ref 70–99)
POTASSIUM: 3.8 meq/L (ref 3.5–5.1)
SODIUM: 140 meq/L (ref 135–145)

## 2013-04-19 LAB — TSH: TSH: 0.51 u[IU]/mL (ref 0.35–5.50)

## 2013-04-19 LAB — HEMOGLOBIN A1C: HEMOGLOBIN A1C: 7.3 % — AB (ref 4.6–6.5)

## 2013-04-19 MED ORDER — POTASSIUM CHLORIDE ER 10 MEQ PO TBCR: 10.0000 meq | EXTENDED_RELEASE_TABLET | Freq: Two times a day (BID) | ORAL | Status: DC

## 2013-04-19 MED ORDER — SITAGLIP PHOS-METFORMIN HCL ER 50-1000 MG PO TB24: 1.0000 | ORAL_TABLET | Freq: Every day | ORAL | Status: DC

## 2013-04-19 MED ORDER — LEVOTHYROXINE SODIUM 137 MCG PO TABS: ORAL_TABLET | ORAL | Status: DC

## 2013-04-19 NOTE — Progress Notes (Signed)
   Subjective:    Patient ID: Marissa Roberts, female    DOB: 04-29-1945, 68 y.o.   MRN: [id number]  HPI Ran out of the  Metformin about 2 weeks ago    Review of Systems  Constitutional: Negative for activity change, appetite change and fatigue.  HENT: Negative for congestion, ear pain, postnasal drip and sinus pressure.   Eyes: Negative for redness and visual disturbance.  Respiratory: Negative for cough, shortness of breath and wheezing.   Gastrointestinal: Negative for abdominal pain and abdominal distention.  Genitourinary: Negative for dysuria, frequency and menstrual problem.  Musculoskeletal: Negative for arthralgias, joint swelling, myalgias and neck pain.  Skin: Negative for rash and wound.  Neurological: Negative for dizziness, weakness and headaches.  Hematological: Negative for adenopathy. Does not bruise/bleed easily.  Psychiatric/Behavioral: Negative for sleep disturbance and decreased concentration.       Objective:   Physical Exam  Constitutional: She is oriented to person, place, and time. She appears well-developed and well-nourished. No distress.  HENT:  Head: Normocephalic and atraumatic.  Eyes: Conjunctivae and EOM are normal. Pupils are equal, round, and reactive to light.  Neck: Normal range of motion. Neck supple. No JVD present. No tracheal deviation present. No thyromegaly present.  Cardiovascular: Normal rate, regular rhythm and intact distal pulses.   Murmur heard. Pulmonary/Chest: Effort normal and breath sounds normal. She has no wheezes. She exhibits no tenderness.  Abdominal: Soft. Bowel sounds are normal.  Musculoskeletal: She exhibits no edema and no tenderness.  Lymphadenopathy:    She has no cervical adenopathy.  Neurological: She is alert and oriented to person, place, and time. She has normal reflexes. No cranial nerve deficit.  Mild neuropathy  Skin: Skin is warm and dry. She is not diaphoretic.  Psychiatric: She has a normal mood and  affect. Her behavior is normal.          Assessment & Plan:  CBG's are in the 100-110 range HTN stable Hypothyroidism monitor TSH Bmet, a1c and tsh due today

## 2013-04-19 NOTE — Patient Instructions (Signed)
The patient is instructed to continue all medications as prescribed. Schedule followup with check out clerk upon leaving the clinic  

## 2013-04-20 ENCOUNTER — Telehealth: Payer: Self-pay

## 2013-04-20 NOTE — Telephone Encounter (Signed)
Relevant patient education mailed to patient.  

## 2013-05-03 ENCOUNTER — Telehealth: Payer: Self-pay | Admitting: Internal Medicine

## 2013-05-03 NOTE — Telephone Encounter (Signed)
Patient calling to clarify her dose of the Synthroid.  States that she just noticed that it is 158mcq and she had been on 117mcq.  States that a Dr. Chalmers Cater with [facility] [phone] (endocrinologist) had changed her dose.  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   She took the samples given of the 157mcq and has picked up the script written.

## 2013-05-05 ENCOUNTER — Telehealth: Payer: Self-pay | Admitting: Internal Medicine

## 2013-05-05 NOTE — Telephone Encounter (Signed)
Relevant patient education mailed to patient.  

## 2013-05-26 ENCOUNTER — Other Ambulatory Visit: Payer: Self-pay | Admitting: *Deleted

## 2013-05-26 MED ORDER — METFORMIN HCL 1000 MG PO TABS: 1000.0000 mg | ORAL_TABLET | Freq: Two times a day (BID) | ORAL | Status: DC

## 2013-05-26 NOTE — Telephone Encounter (Signed)
Per dr Arnoldo Morale metformin  1000 bid may change to that . Pt informed and med sent to [facility]

## 2013-05-26 NOTE — Telephone Encounter (Signed)
Patient Information:  Caller Name: Leone Brand  Phone: [phone]  Patient: Marissa Roberts, Marissa Roberts  Gender: Female  DOB: 08-15-1945  Age: 68 Years  PCP: Benay Pillow (Adults only)  Office Follow Up:  Does the office need to follow up with this patient?: Yes  Instructions For The Office: Medication question; please call back  RN Note:  FBS 196 05/25/13. Since began Jaunmet, FBS ranged from 148 to 196.  Asking if MD will change medication back to Metformin.  Symptoms  Reason For Call & Symptoms: Blood sugar continues to be elevated since starting Janumet 04/20/13.  FBS 181 05/26/13 at 0800.  Reviewed Health History In EMR: Yes  Reviewed Medications In EMR: Yes  Reviewed Allergies In EMR: Yes  Reviewed Surgeries / Procedures: Yes  Date of Onset of Symptoms: 04/20/2013  Treatments Tried: Taking Janumet daily, began walking daily for 1 hour at [facility] 05/24/13, drinking more water.  Treatments Tried Worked: No  Guideline(s) Used:  Diabetes - High Blood Sugar  Disposition Per Guideline:   Discuss with PCP and Callback by Nurse Today  Reason For Disposition Reached:   Caller has NON-URGENT medication question about med that PCP prescribed and triager unable to answer question  Advice Given:  General  Definition of hyperglycemia: - Fasting blood glucose more than 140 mg/dL (7.5 mmol/l) or random blood glucose more than 200 mg/dL (11 mmol/l).  Symptoms of mild hyperglycemia: frequent urination, increased thirst, fatigue, blurred vision.  Treatment - Liquids  Drink at least one glass (8 oz or 240 ml) of water per hour for the next 4 hours. (Reason: adequate hydration will reduce hyperglycemia).  Generally, you should try to drink 6-8 glasses of water each day.  Treatment - Diabetes Medications  : Continue taking your diabetes pills.  Measure and Record Your Blood Glucose  Every day you should measure your blood glucose before breakfast and before going to bed.  Record the results and show  them to your doctor at your next office visit.  Call Back If:  Blood glucose more than 300 mg/dL (16.5 mmol/l), 2 or more times in a row.  Vomiting lasting more than 4 hours or unable to drink any liquids.  Rapid breathing occurs  You become worse.  Patient Will Follow Care Advice:  YES

## 2013-06-09 ENCOUNTER — Encounter: Payer: Self-pay | Admitting: Family

## 2013-06-09 ENCOUNTER — Ambulatory Visit (INDEPENDENT_AMBULATORY_CARE_PROVIDER_SITE_OTHER): Payer: Medicare Other | Admitting: Family

## 2013-06-09 VITALS — BP 140/72 | HR 95 | Temp 98.9°F | Wt 241.0 lb

## 2013-06-09 DIAGNOSIS — E039 Hypothyroidism, unspecified: Secondary | ICD-10-CM

## 2013-06-09 DIAGNOSIS — R05 Cough: Secondary | ICD-10-CM

## 2013-06-09 DIAGNOSIS — R059 Cough, unspecified: Secondary | ICD-10-CM

## 2013-06-09 DIAGNOSIS — R0982 Postnasal drip: Secondary | ICD-10-CM

## 2013-06-09 LAB — TSH: TSH: 0.37 u[IU]/mL (ref 0.35–5.50)

## 2013-06-09 MED ORDER — OXYCODONE HCL 5 MG PO TABS: 5.0000 mg | ORAL_TABLET | Freq: Two times a day (BID) | ORAL | Status: DC | PRN

## 2013-06-09 MED ORDER — GUAIFENESIN-CODEINE 100-10 MG/5ML PO SYRP: 5.0000 mL | ORAL_SOLUTION | Freq: Three times a day (TID) | ORAL | Status: DC | PRN

## 2013-06-09 MED ORDER — AMLODIPINE BESY-BENAZEPRIL HCL 5-40 MG PO CAPS: 1.0000 | ORAL_CAPSULE | Freq: Every day | ORAL | Status: DC

## 2013-06-09 NOTE — Progress Notes (Signed)
Subjective:    Patient ID: Marissa Roberts, female    DOB: 08-27-1945, 68 y.o.   MRN: [id number]  HPI 68 year old African American female, nonsmoker is in today with complaints of cough, congestion, sinus drainage x3 days. She's been taking over-the-counter cough syrup has not helped her symptoms.  She has a history of hypothyroidism and reports her dosage of Synthroid being increased 137 mcg from 125 by Dr. Arnoldo Morale. Reports have been held off.   Review of Systems  Constitutional: Negative.  Negative for fever.  HENT: Positive for congestion.   Respiratory: Positive for cough. Negative for shortness of breath and wheezing.   Cardiovascular: Negative.   Gastrointestinal: Negative.   Endocrine: Negative.   Genitourinary: Negative.   Musculoskeletal: Negative.   Skin:       Hair loss  Neurological: Negative.   Psychiatric/Behavioral: Negative.    Past Medical History  Diagnosis Date  . Hypertension   . Thyroid disease   . Diabetes mellitus   . CHF (congestive heart failure)   . PONV (postoperative nausea and vomiting)   . Hypothyroidism   . Pneumonia     hx of  . Bronchitis     hx of  . GERD (gastroesophageal reflux disease)   . Arthritis   . Hypercholesteremia     History   Social History  . Marital Status: Married    Spouse Name: N/A    Number of Children: N/A  . Years of Education: N/A   Occupational History  . Not on file.   Social History Main Topics  . Smoking status: Former Smoker    Quit date: 04/01/1990  . Smokeless tobacco: Never Used  . Alcohol Use: No  . Drug Use: No  . Sexual Activity: Not on file   Other Topics Concern  . Not on file   Social History Narrative  . No narrative on file    Past Surgical History  Procedure Laterality Date  . Knee arhtroscopy    . Carpel tunnel    . Foot surgery    . Abdominal hysterectomy      partial  . Tonsillectomy    . Breast surgery      lumpectomy left breast  . Colonoscopy w/ polypectomy     . Shoulder arthroscopy with subacromial decompression, rotator cuff repair and bicep tendon repair Right 05/26/2012    Procedure: Right Shoulder Diagnostic Operative Arthroscopy, Subacromial Decompression, Biceps Tenodesis, Mini Open Rotator Cuff Repair;  Surgeon: [name], MD;  Location: [facility];  Service: Orthopedics;  Laterality: Right;  Right Shoulder Diagnostic Operative Arthroscopy, Subacromial Decompression, Biceps Tenodesis, Mini Open Rotator Cuff Repair    Family History  Problem Relation Age of Onset  . Adopted: Yes  . Coronary artery disease    . Diabetes Mother   . Heart disease Mother   . Hyperlipidemia Mother     Allergies  Allergen Reactions  . Contrast Media [Iodinated Diagnostic Agents]     Throat swells from ct con tra  . Adhesive [Tape]     No paper tape, causes blisters  . Erythromycin     REACTION: rash  . Penicillins     REACTION: rash    Current Outpatient Prescriptions on File Prior to Visit  Medication Sig Dispense Refill  . Calcium Carbonate (CALCIUM 600) 1500 MG TABS Take 2 tablets by mouth daily.       Marland Kitchen ezetimibe-simvastatin (VYTORIN) 10-20 MG per tablet Take 0.5 tablets by mouth at bedtime. 1/2 tab daily       .  FREESTYLE LITE test strip USE TWICE DAILY TO TEST BLOOD SUGAR  100 each  4  . furosemide (LASIX) 40 MG tablet Take 80 mg by mouth 2 (two) times daily.      Marland Kitchen glucosamine-chondroitin 500-400 MG tablet Take 1 tablet by mouth daily.        . insulin glargine (LANTUS) 100 UNIT/ML injection Inject 0.55 mLs (55 Units total) into the skin at bedtime.  10 mL  3  . insulin lispro (HUMALOG) 100 UNIT/ML injection Inject 10 Units into the skin 3 (three) times daily before meals.      . Insulin Pen Needle (B-D ULTRAFINE III SHORT PEN) 31G X 8 MM MISC 1 each by Does not apply route daily. To use with lantus  100 each  3  . levothyroxine (SYNTHROID, LEVOTHROID) 137 MCG tablet TAKE ONE TABLET BY MOUTH ONCE DAILY  90 tablet  3  . metFORMIN  (GLUCOPHAGE) 1000 MG tablet Take 1 tablet (1,000 mg total) by mouth 2 (two) times daily with a meal.  60 tablet  11  . omeprazole (PRILOSEC) 20 MG capsule Take 40 mg by mouth daily.       . potassium chloride (K-DUR) 10 MEQ tablet Take 1 tablet (10 mEq total) by mouth 2 (two) times daily.  180 tablet  3  . [DISCONTINUED] Saxagliptin-Metformin 07-998 MG TB24 Take 1 tablet by mouth daily.  30 tablet     No current facility-administered medications on file prior to visit.    BP 140/72  Pulse 95  Temp(Src) 98.9 F (37.2 C) (Oral)  Wt 241 lb (109.317 kg)chart    Objective:   Physical Exam  Constitutional: She is oriented to person, place, and time. She appears well-developed and well-nourished.  HENT:  Right Ear: External ear normal.  Left Ear: External ear normal.  Nose: Nose normal.  Mouth/Throat: Oropharynx is clear and moist.  Neck: Normal range of motion. Neck supple.  Cardiovascular: Normal rate, regular rhythm and normal heart sounds.   Pulmonary/Chest: Effort normal and breath sounds normal.  Musculoskeletal: Normal range of motion.  Neurological: She is alert and oriented to person, place, and time.  Skin: Skin is warm and dry.  Psychiatric: She has a normal mood and affect.          Assessment & Plan:  [name] was seen today for cough.  Diagnoses and associated orders for this visit:  Cough  Post-nasal drip  Unspecified hypothyroidism - TSH  Other Orders - guaiFENesin-codeine (CHERATUSSIN AC) 100-10 MG/5ML syrup; Take 5 mLs by mouth 3 (three) times daily as needed. - oxyCODONE (ROXICODONE) 5 MG immediate release tablet; Take 1 tablet (5 mg total) by mouth every 12 (twelve) hours as needed for severe pain. - amLODipine-benazepril (LOTREL) 5-40 MG per capsule; Take 1 capsule by mouth daily.    followup pending labs.

## 2013-06-09 NOTE — Telephone Encounter (Signed)
error 

## 2013-06-09 NOTE — Patient Instructions (Addendum)
1. Obtain Zyrtec or Allegra and then once daily.     and chills for

## 2013-06-14 ENCOUNTER — Ambulatory Visit: Payer: Medicare Other | Admitting: Podiatry

## 2013-08-25 ENCOUNTER — Encounter: Payer: Self-pay | Admitting: Family

## 2013-08-25 ENCOUNTER — Ambulatory Visit (INDEPENDENT_AMBULATORY_CARE_PROVIDER_SITE_OTHER): Payer: Medicare Other | Admitting: Family

## 2013-08-25 VITALS — BP 140/70 | HR 83 | Temp 98.6°F | Ht 64.0 in | Wt 244.0 lb

## 2013-08-25 DIAGNOSIS — E1165 Type 2 diabetes mellitus with hyperglycemia: Secondary | ICD-10-CM

## 2013-08-25 DIAGNOSIS — R609 Edema, unspecified: Secondary | ICD-10-CM

## 2013-08-25 DIAGNOSIS — I10 Essential (primary) hypertension: Secondary | ICD-10-CM

## 2013-08-25 DIAGNOSIS — IMO0001 Reserved for inherently not codable concepts without codable children: Secondary | ICD-10-CM

## 2013-08-25 LAB — COMPREHENSIVE METABOLIC PANEL
ALBUMIN: 3.6 g/dL (ref 3.5–5.2)
ALK PHOS: 53 U/L (ref 39–117)
ALT: 23 U/L (ref 0–35)
AST: 24 U/L (ref 0–37)
BUN: 11 mg/dL (ref 6–23)
CALCIUM: 9.5 mg/dL (ref 8.4–10.5)
CHLORIDE: 103 meq/L (ref 96–112)
CO2: 26 meq/L (ref 19–32)
Creatinine, Ser: 0.8 mg/dL (ref 0.4–1.2)
GFR: 96.45 mL/min (ref >60.00)
GLUCOSE: 160 mg/dL — AB (ref 70–99)
Potassium: 3.7 mEq/L (ref 3.5–5.1)
Sodium: 139 mEq/L (ref 135–145)
TOTAL PROTEIN: 6.8 g/dL (ref 6.0–8.3)
Total Bilirubin: 0.3 mg/dL (ref 0.2–1.2)

## 2013-08-25 LAB — POCT URINALYSIS DIPSTICK
BILIRUBIN UA: NEGATIVE
Glucose, UA: NEGATIVE
KETONES UA: NEGATIVE
Leukocytes, UA: NEGATIVE
Nitrite, UA: NEGATIVE
RBC UA: NEGATIVE
SPEC GRAV UA: 1.02
Urobilinogen, UA: 0.2
pH, UA: 5

## 2013-08-25 LAB — HEMOGLOBIN A1C: HEMOGLOBIN A1C: 7.5 % — AB (ref 4.6–6.5)

## 2013-08-25 NOTE — Progress Notes (Signed)
Subjective:    Patient ID: Marissa Roberts, female    DOB: 1945/10/05, 68 y.o.   MRN: [id number]  HPI  68 year old white fem in [place], is in today with c/o ankle swelling bilaterally x2 weeks. Reports an increase in sodium intake. Also reports that 4 months ago, her Lasix was decreased from 80 mg twice a day to 80 mg once a day. She has a history of hypertension, hypothyroidism, type 2 diabetes. Last A1c 7.3 in January.  Review of Systems  Constitutional: Negative.   HENT: Negative.   Respiratory: Negative.   Cardiovascular: Positive for leg swelling. Negative for chest pain and palpitations.  Gastrointestinal: Negative.   Skin: Negative.   Allergic/Immunologic: Negative.   Neurological: Negative.   Psychiatric/Behavioral: Negative.    Past Medical History  Diagnosis Date  . Hypertension   . Thyroid disease   . Diabetes mellitus   . CHF (congestive heart failure)   . PONV (postoperative nausea and vomiting)   . Hypothyroidism   . Pneumonia     hx of  . Bronchitis     hx of  . GERD (gastroesophageal reflux disease)   . Arthritis   . Hypercholesteremia     History   Social History  . Marital Status: Married    Spouse Name: N/A    Number of Children: N/A  . Years of Education: N/A   Occupational History  . Not on file.   Social History Main Topics  . Smoking status: Former Smoker    Quit date: 04/01/1990  . Smokeless tobacco: Never Used  . Alcohol Use: No  . Drug Use: No  . Sexual Activity: Not on file   Other Topics Concern  . Not on file   Social History Narrative  . No narrative on file    Past Surgical History  Procedure Laterality Date  . Knee arhtroscopy    . Carpel tunnel    . Foot surgery    . Abdominal hysterectomy      partial  . Tonsillectomy    . Breast surgery      lumpectomy left breast  . Colonoscopy w/ polypectomy    . Shoulder arthroscopy with subacromial decompression, rotator cuff repair and bicep tendon repair Right 05/26/2012     Procedure: Right Shoulder Diagnostic Operative Arthroscopy, Subacromial Decompression, Biceps Tenodesis, Mini Open Rotator Cuff Repair;  Surgeon: [name], MD;  Location: [facility];  Service: Orthopedics;  Laterality: Right;  Right Shoulder Diagnostic Operative Arthroscopy, Subacromial Decompression, Biceps Tenodesis, Mini Open Rotator Cuff Repair    Family History  Problem Relation Age of Onset  . Adopted: Yes  . Coronary artery disease    . Diabetes Mother   . Heart disease Mother   . Hyperlipidemia Mother     Allergies  Allergen Reactions  . Contrast Media [Iodinated Diagnostic Agents]     Throat swells from ct con tra  . Adhesive [Tape]     No paper tape, causes blisters  . Erythromycin     REACTION: rash  . Penicillins     REACTION: rash    Current Outpatient Prescriptions on File Prior to Visit  Medication Sig Dispense Refill  . amLODipine-benazepril (LOTREL) 5-40 MG per capsule Take 1 capsule by mouth daily.  90 capsule  2  . Calcium Carbonate (CALCIUM 600) 1500 MG TABS Take 2 tablets by mouth daily.       Marland Kitchen ezetimibe-simvastatin (VYTORIN) 10-20 MG per tablet Take 0.5 tablets by mouth at  bedtime. 1/2 tab daily       . FREESTYLE LITE test strip USE TWICE DAILY TO TEST BLOOD SUGAR  100 each  4  . furosemide (LASIX) 40 MG tablet Take 80 mg by mouth 2 (two) times daily.      Marland Kitchen glucosamine-chondroitin 500-400 MG tablet Take 1 tablet by mouth daily.        Marland Kitchen guaiFENesin-codeine (CHERATUSSIN AC) 100-10 MG/5ML syrup Take 5 mLs by mouth 3 (three) times daily as needed.  120 mL  0  . insulin glargine (LANTUS) 100 UNIT/ML injection Inject 0.55 mLs (55 Units total) into the skin at bedtime.  10 mL  3  . insulin lispro (HUMALOG) 100 UNIT/ML injection Inject 10 Units into the skin 3 (three) times daily before meals.      . Insulin Pen Needle (B-D ULTRAFINE III SHORT PEN) 31G X 8 MM MISC 1 each by Does not apply route daily. To use with lantus  100 each  3  . levothyroxine  (SYNTHROID, LEVOTHROID) 137 MCG tablet TAKE ONE TABLET BY MOUTH ONCE DAILY  90 tablet  3  . metFORMIN (GLUCOPHAGE) 1000 MG tablet Take 1 tablet (1,000 mg total) by mouth 2 (two) times daily with a meal.  60 tablet  11  . omeprazole (PRILOSEC) 20 MG capsule Take 40 mg by mouth daily.       Marland Kitchen oxyCODONE (ROXICODONE) 5 MG immediate release tablet Take 1 tablet (5 mg total) by mouth every 12 (twelve) hours as needed for severe pain.  30 tablet  0  . potassium chloride (K-DUR) 10 MEQ tablet Take 1 tablet (10 mEq total) by mouth 2 (two) times daily.  180 tablet  3  . [DISCONTINUED] Saxagliptin-Metformin 07-998 MG TB24 Take 1 tablet by mouth daily.  30 tablet     No current facility-administered medications on file prior to visit.    BP 140/70  Pulse 83  Temp(Src) 98.6 F (37 C) (Oral)  Ht 5\' 4"  (1.626 m)  Wt 244 lb (110.678 kg)  BMI 41.86 kg/m2  SpO2 98%chart     Objective:   Physical Exam  Constitutional: She is oriented to person, place, and time. She appears well-developed and well-nourished.  Neck: Normal range of motion. Neck supple.  Cardiovascular: Normal rate, regular rhythm and normal heart sounds.   Pulmonary/Chest: Effort normal and breath sounds normal.  Abdominal: Soft. Bowel sounds are normal.  Musculoskeletal: She exhibits edema. She exhibits no tenderness.  Neurological: She is alert and oriented to person, place, and time.  Skin: Skin is warm and dry.  Psychiatric: She has a normal mood and affect.          Assessment & Plan:  [name] was seen today for foot swelling.  Diagnoses and associated orders for this visit:  Peripheral edema - POCT Urine Dip - CMP  Type II or unspecified type diabetes mellitus without mention of complication, uncontrolled - Hemoglobin A1c  Essential hypertension, benign   Call the office with any questions or concerns. Recheck as scheduled and as needed.

## 2013-08-25 NOTE — Patient Instructions (Signed)

## 2013-09-14 ENCOUNTER — Other Ambulatory Visit: Payer: Self-pay | Admitting: Internal Medicine

## 2013-09-22 ENCOUNTER — Encounter: Payer: Self-pay | Admitting: Podiatry

## 2013-09-22 ENCOUNTER — Ambulatory Visit (INDEPENDENT_AMBULATORY_CARE_PROVIDER_SITE_OTHER): Payer: Medicare Other | Admitting: Podiatry

## 2013-09-22 VITALS — BP 166/85 | HR 81 | Resp 18 | Ht 65.0 in | Wt 238.0 lb

## 2013-09-22 DIAGNOSIS — B351 Tinea unguium: Secondary | ICD-10-CM

## 2013-09-22 DIAGNOSIS — M79673 Pain in unspecified foot: Secondary | ICD-10-CM

## 2013-09-22 DIAGNOSIS — M79609 Pain in unspecified limb: Secondary | ICD-10-CM

## 2013-09-22 DIAGNOSIS — R609 Edema, unspecified: Secondary | ICD-10-CM

## 2013-09-22 NOTE — Progress Notes (Signed)
   Subjective:    Patient ID: Marissa Roberts, female    DOB: 21-Nov-1945, 68 y.o.   MRN: [id number]  HPI Comments: N ankle pain L left ankle distal fibula area D May 2015 O walking and using the treadmill C pain and swelling, sensation of weakness A worse in the morning T Dr. Marlou Sa at [facility] x-rayed and prescribed antiinflammatory medication, Dr. Megan Salon instructed to increase fluid pill and stop the antiinflammatory medication.  Pt request debridement of 9 toenails.  Foot Pain   The left ankle edema has been evaluated by the patient's primary care physician as well as an orthopedic Dr. The left ankle edema has improved, however still persistent. The orthopedic Dr. apparently prescribed and NSAID, however, patient's primary doctor or assistant advised patient not to take the NSAID because of hospital kidney problems  Review of Systems  All other systems reviewed and are negative.      Objective:   Physical Exam Orientated x3 days black female  Vascular: DP and PT pulses 2/4 bilaterally There is no calf edema or calf tenderness left Pitting edema left ankle  Dermatological: Brittle, discolored, hypertrophic toenails 6-10  Musculoskeletal: No restriction left ankle motion     Assessment & Plan:   Assessment: Edema left ankle undetermined origin Symptomatic onychomycoses x10  Plan: Advised patient return to orthopedic Dr. to further evaluate left ankle edema Nails x10 are debrided without any bleeding  Reappoint at three-month intervals for nail debridement

## 2013-09-22 NOTE — Patient Instructions (Signed)
Return to orthopedic Dr. for further evaluation of left ankle swelling

## 2013-10-07 ENCOUNTER — Ambulatory Visit (INDEPENDENT_AMBULATORY_CARE_PROVIDER_SITE_OTHER): Payer: Medicare Other | Admitting: Family

## 2013-10-07 ENCOUNTER — Encounter: Payer: Self-pay | Admitting: Family

## 2013-10-07 VITALS — BP 134/80 | HR 88 | Temp 98.5°F | Ht 65.0 in | Wt 243.0 lb

## 2013-10-07 DIAGNOSIS — R5381 Other malaise: Secondary | ICD-10-CM

## 2013-10-07 DIAGNOSIS — R5383 Other fatigue: Secondary | ICD-10-CM

## 2013-10-07 DIAGNOSIS — I10 Essential (primary) hypertension: Secondary | ICD-10-CM

## 2013-10-07 DIAGNOSIS — Z23 Encounter for immunization: Secondary | ICD-10-CM

## 2013-10-07 DIAGNOSIS — E7800 Pure hypercholesterolemia, unspecified: Secondary | ICD-10-CM

## 2013-10-07 DIAGNOSIS — E039 Hypothyroidism, unspecified: Secondary | ICD-10-CM

## 2013-10-07 MED ORDER — INSULIN LISPRO 100 UNIT/ML ~~LOC~~ SOLN: 12.0000 [IU] | Freq: Three times a day (TID) | SUBCUTANEOUS | Status: DC

## 2013-10-07 NOTE — Progress Notes (Signed)
Pre visit review using our clinic review tool, if applicable. No additional management support is needed unless otherwise documented below in the visit note. 

## 2013-10-07 NOTE — Patient Instructions (Signed)
Diabetes and Exercise Exercising regularly is important. It is not just about losing weight. It has many health benefits, such as:  Improving your overall fitness, flexibility, and endurance.  Increasing your bone density.  Helping with weight control.  Decreasing your body fat.  Increasing your muscle strength.  Reducing stress and tension.  Improving your overall health. People with diabetes who exercise gain additional benefits because exercise:  Reduces appetite.  Improves the body's use of blood sugar (glucose).  Helps lower or control blood glucose.  Decreases blood pressure.  Helps control blood lipids (such as cholesterol and triglycerides).  Improves the body's use of the hormone insulin by:  Increasing the body's insulin sensitivity.  Reducing the body's insulin needs.  Decreases the risk for heart disease because exercising:  Lowers cholesterol and triglycerides levels.  Increases the levels of good cholesterol (such as high-density lipoproteins [HDL]) in the body.  Lowers blood glucose levels. YOUR ACTIVITY PLAN  Choose an activity that you enjoy and set realistic goals. Your health care provider or diabetes educator can help you make an activity plan that works for you. You can break activities into 2 or 3 sessions throughout the day. Doing so is as good as one long session. Exercise ideas include:  Taking the dog for a walk.  Taking the stairs instead of the elevator.  Dancing to your favorite song.  Doing your favorite exercise with a friend. RECOMMENDATIONS FOR EXERCISING WITH TYPE 1 OR TYPE 2 DIABETES   Check your blood glucose before exercising. If blood glucose levels are greater than 240 mg/dL, check for urine ketones. Do not exercise if ketones are present.  Avoid injecting insulin into areas of the body that are going to be exercised. For example, avoid injecting insulin into:  The arms when playing tennis.  The legs when  jogging.  Keep a record of:  Food intake before and after you exercise.  Expected peak times of insulin action.  Blood glucose levels before and after you exercise.  The type and amount of exercise you have done.  Review your records with your health care provider. Your health care provider will help you to develop guidelines for adjusting food intake and insulin amounts before and after exercising.  If you take insulin or oral hypoglycemic agents, watch for signs and symptoms of hypoglycemia. They include:  Dizziness.  Shaking.  Sweating.  Chills.  Confusion.  Drink plenty of water while you exercise to prevent dehydration or heat stroke. Body water is lost during exercise and must be replaced.  Talk to your health care provider before starting an exercise program to make sure it is safe for you. Remember, almost any type of activity is better than none. Document Released: 06/08/2003 Document Revised: 11/18/2012 Document Reviewed: 08/25/2012 ExitCare Patient Information 2015 [employer]. This information is not intended to replace advice given to you by your health care provider. Make sure you discuss any questions you have with your health care provider.  

## 2013-10-07 NOTE — Progress Notes (Signed)
Subjective:    Patient ID: Marissa Roberts, female    DOB: 10-Jan-1946, 68 y.o.   MRN: [id number]  HPI 68 year old African American female, nonsmoker with a history of type 2 diabetes, hypertension, hyperlipidemia, and obesity is in today for recheck. Is currently taking NovoLog 12 units with meals and Lantus at bedtime. Has had a diabetic eye exam. Blood sugars fasting typically at 130 to 140.   Review of Systems  Constitutional: Negative.   HENT: Negative.   Respiratory: Negative.   Cardiovascular: Negative.   Gastrointestinal: Negative.   Genitourinary: Negative.   Musculoskeletal: Negative.   Skin: Negative.   Allergic/Immunologic: Negative.   Neurological: Negative.   Psychiatric/Behavioral: Negative.    Past Medical History  Diagnosis Date  . Hypertension   . Thyroid disease   . Diabetes mellitus   . CHF (congestive heart failure)   . PONV (postoperative nausea and vomiting)   . Hypothyroidism   . Pneumonia     hx of  . Bronchitis     hx of  . GERD (gastroesophageal reflux disease)   . Arthritis   . Hypercholesteremia     History   Social History  . Marital Status: Married    Spouse Name: N/A    Number of Children: N/A  . Years of Education: N/A   Occupational History  . Not on file.   Social History Main Topics  . Smoking status: Former Smoker    Quit date: 04/01/1990  . Smokeless tobacco: Never Used  . Alcohol Use: No  . Drug Use: No  . Sexual Activity: Not on file   Other Topics Concern  . Not on file   Social History Narrative  . No narrative on file    Past Surgical History  Procedure Laterality Date  . Knee arhtroscopy    . Carpel tunnel    . Foot surgery    . Abdominal hysterectomy      partial  . Tonsillectomy    . Breast surgery      lumpectomy left breast  . Colonoscopy w/ polypectomy    . Shoulder arthroscopy with subacromial decompression, rotator cuff repair and bicep tendon repair Right 05/26/2012    Procedure: Right  Shoulder Diagnostic Operative Arthroscopy, Subacromial Decompression, Biceps Tenodesis, Mini Open Rotator Cuff Repair;  Surgeon: [name], MD;  Location: [facility];  Service: Orthopedics;  Laterality: Right;  Right Shoulder Diagnostic Operative Arthroscopy, Subacromial Decompression, Biceps Tenodesis, Mini Open Rotator Cuff Repair    Family History  Problem Relation Age of Onset  . Adopted: Yes  . Coronary artery disease    . Diabetes Mother   . Heart disease Mother   . Hyperlipidemia Mother     Allergies  Allergen Reactions  . Contrast Media [Iodinated Diagnostic Agents]     Throat swells from ct con tra  . Adhesive [Tape]     No paper tape, causes blisters  . Erythromycin     REACTION: rash  . Penicillins     REACTION: rash    Current Outpatient Prescriptions on File Prior to Visit  Medication Sig Dispense Refill  . amLODipine-benazepril (LOTREL) 5-40 MG per capsule Take 1 capsule by mouth daily.  90 capsule  2  . Calcium Carbonate (CALCIUM 600) 1500 MG TABS Take 2 tablets by mouth daily.       Marland Kitchen ezetimibe-simvastatin (VYTORIN) 10-20 MG per tablet Take 0.5 tablets by mouth at bedtime. 1/2 tab daily       . FREESTYLE LITE  test strip USE TWICE DAILY TO TEST BLOOD SUGAR  100 each  4  . furosemide (LASIX) 40 MG tablet TAKE 2 TABLETS TWICE A DAY  360 tablet  1  . glucosamine-chondroitin 500-400 MG tablet Take 1 tablet by mouth daily.        . Insulin Pen Needle (B-D ULTRAFINE III SHORT PEN) 31G X 8 MM MISC 1 each by Does not apply route daily. To use with lantus  100 each  3  . LANTUS SOLOSTAR 100 UNIT/ML Solostar Pen INJECT 55 UNITS INTO THE SKIN AT BEDTIME  4 pen  5  . levothyroxine (SYNTHROID, LEVOTHROID) 137 MCG tablet TAKE ONE TABLET BY MOUTH ONCE DAILY  90 tablet  3  . metFORMIN (GLUCOPHAGE) 1000 MG tablet Take 1 tablet (1,000 mg total) by mouth 2 (two) times daily with a meal.  60 tablet  11  . omeprazole (PRILOSEC) 20 MG capsule Take 40 mg by mouth daily.       .  potassium chloride (K-DUR) 10 MEQ tablet Take 1 tablet (10 mEq total) by mouth 2 (two) times daily.  180 tablet  3  . [DISCONTINUED] Saxagliptin-Metformin 07-998 MG TB24 Take 1 tablet by mouth daily.  30 tablet     No current facility-administered medications on file prior to visit.    BP 134/80  Pulse 88  Temp(Src) 98.5 F (36.9 C) (Oral)  Ht 5\' 5"  (1.651 m)  Wt 243 lb (110.224 kg)  BMI 40.44 kg/m2chart    Objective:   Physical Exam  Constitutional: She is oriented to person, place, and time. She appears well-developed and well-nourished.  Neck: Normal range of motion. Neck supple.  Cardiovascular: Normal rate, regular rhythm and normal heart sounds.   Pulmonary/Chest: Effort normal and breath sounds normal.  Abdominal: Soft. Bowel sounds are normal.  Musculoskeletal: Normal range of motion.  Neurological: She is alert and oriented to person, place, and time.  Skin: Skin is warm and dry.  Psychiatric: She has a normal mood and affect.          Assessment & Plan:  [name] was seen today for hypertension.  Diagnoses and associated orders for this visit:  Unspecified essential hypertension  Unspecified hypothyroidism  Pure hypercholesterolemia  Other malaise and fatigue  Need for prophylactic vaccination with tetanus toxoid alone - Td vaccine preservative free greater than or equal to 7yo IM  Other Orders - insulin lispro (HUMALOG) 100 UNIT/ML injection; Inject 0.12 mLs (12 Units total) into the skin 3 (three) times daily before meals.   Call the office with any questions or concerns. Recheck as scheduled and as needed.

## 2013-10-12 ENCOUNTER — Telehealth: Payer: Self-pay | Admitting: Family

## 2013-10-12 MED ORDER — INSULIN ASPART 100 UNIT/ML FLEXPEN: 12.0000 [IU] | PEN_INJECTOR | Freq: Three times a day (TID) | SUBCUTANEOUS | Status: DC

## 2013-10-12 NOTE — Telephone Encounter (Signed)
Clarification given on medication. Pt states humolog is not covered by insurance. Novolog sent to [facility]

## 2013-10-12 NOTE — Telephone Encounter (Signed)
Pt is needing new rx novolog flex pen syr 44mi 5:s 100 u ml,  12 units 3 times daily.pt  States she was switched [facility]. Pt states she is on her last pen today.

## 2013-10-12 NOTE — Telephone Encounter (Signed)
Novolog is not on med list. Left message for pt to call back to clarify Rx

## 2013-10-18 ENCOUNTER — Ambulatory Visit: Payer: Medicare Other | Admitting: Internal Medicine

## 2013-10-19 ENCOUNTER — Telehealth: Payer: Self-pay | Admitting: Family

## 2013-10-19 MED ORDER — EZETIMIBE-SIMVASTATIN 10-20 MG PO TABS: 0.5000 | ORAL_TABLET | Freq: Every day | ORAL | Status: DC

## 2013-10-19 NOTE — Telephone Encounter (Signed)
Rx sent to pharmacy   

## 2013-10-19 NOTE — Telephone Encounter (Signed)
Pt used to get samples of ezetimibe-simvastatin (VYTORIN) 10-20 MG per from dr Arnoldo Morale.  Pt would like to know does she continue to take this med If so, pt needs rx (generic if possible) sent to  [facility]. 90 day/  Pt has been out of this med since 7/17.

## 2013-10-29 ENCOUNTER — Telehealth: Payer: Self-pay | Admitting: *Deleted

## 2013-10-29 DIAGNOSIS — E119 Type 2 diabetes mellitus without complications: Secondary | ICD-10-CM

## 2013-10-29 DIAGNOSIS — E785 Hyperlipidemia, unspecified: Secondary | ICD-10-CM

## 2013-10-29 NOTE — Telephone Encounter (Signed)
Left message on machine for patient  Lipid panel ordered Diabetic bundle

## 2013-11-01 ENCOUNTER — Telehealth: Payer: Self-pay | Admitting: Family

## 2013-11-01 ENCOUNTER — Other Ambulatory Visit (INDEPENDENT_AMBULATORY_CARE_PROVIDER_SITE_OTHER): Payer: Medicare Other

## 2013-11-01 DIAGNOSIS — E119 Type 2 diabetes mellitus without complications: Secondary | ICD-10-CM

## 2013-11-01 DIAGNOSIS — E785 Hyperlipidemia, unspecified: Secondary | ICD-10-CM

## 2013-11-01 LAB — LIPID PANEL
Cholesterol: 171 mg/dL (ref 0–200)
HDL: 54.5 mg/dL (ref >39.00)
LDL Cholesterol: 96 mg/dL (ref 0–99)
NONHDL: 116.5
Total CHOL/HDL Ratio: 3
Triglycerides: 105 mg/dL (ref 0.0–149.0)
VLDL: 21 mg/dL (ref 0.0–40.0)

## 2013-11-01 NOTE — Telephone Encounter (Signed)
Pt would to try simvastatin but she has enough vytorin to last 2 more months. She will contact office when she needs a refill

## 2013-11-01 NOTE — Telephone Encounter (Signed)
Pt would like generic or something equivalent to ezetimibe-simvastatin (VYTORIN) 10-20 MG per tablet  . Pt always got samples from dr Arnoldo Morale and this med was too costly. [facility], but would like something else prior to next refill request. [facility]

## 2013-11-01 NOTE — Telephone Encounter (Signed)
Please advise 

## 2013-11-01 NOTE — Telephone Encounter (Signed)
appt scheduled

## 2013-11-01 NOTE — Telephone Encounter (Signed)
No equivalent to Vytorin. Since cost is the issue, we can switch to Simvastatin if she likes.

## 2013-12-29 ENCOUNTER — Ambulatory Visit: Payer: Medicare Other | Admitting: Podiatry

## 2014-01-07 ENCOUNTER — Encounter: Payer: Self-pay | Admitting: Family

## 2014-01-07 ENCOUNTER — Ambulatory Visit (INDEPENDENT_AMBULATORY_CARE_PROVIDER_SITE_OTHER): Payer: Medicare Other | Admitting: Family

## 2014-01-07 VITALS — BP 132/78 | HR 74 | Ht 65.0 in | Wt 240.0 lb

## 2014-01-07 DIAGNOSIS — E1165 Type 2 diabetes mellitus with hyperglycemia: Secondary | ICD-10-CM

## 2014-01-07 DIAGNOSIS — E876 Hypokalemia: Secondary | ICD-10-CM

## 2014-01-07 DIAGNOSIS — IMO0002 Reserved for concepts with insufficient information to code with codable children: Secondary | ICD-10-CM

## 2014-01-07 DIAGNOSIS — E7800 Pure hypercholesterolemia, unspecified: Secondary | ICD-10-CM

## 2014-01-07 DIAGNOSIS — E039 Hypothyroidism, unspecified: Secondary | ICD-10-CM

## 2014-01-07 DIAGNOSIS — Z23 Encounter for immunization: Secondary | ICD-10-CM

## 2014-01-07 LAB — BASIC METABOLIC PANEL
BUN: 9 mg/dL (ref 6–23)
CALCIUM: 9.7 mg/dL (ref 8.4–10.5)
CO2: 27 mEq/L (ref 19–32)
CREATININE: 0.6 mg/dL (ref 0.4–1.2)
Chloride: 105 mEq/L (ref 96–112)
GFR: 119.26 mL/min (ref >60.00)
Glucose, Bld: 135 mg/dL — ABNORMAL HIGH (ref 70–99)
Potassium: 3.8 mEq/L (ref 3.5–5.1)
SODIUM: 140 meq/L (ref 135–145)

## 2014-01-07 LAB — HEPATIC FUNCTION PANEL
ALK PHOS: 63 U/L (ref 39–117)
ALT: 23 U/L (ref 0–35)
AST: 20 U/L (ref 0–37)
Albumin: 3.4 g/dL — ABNORMAL LOW (ref 3.5–5.2)
BILIRUBIN DIRECT: 0 mg/dL (ref 0.0–0.3)
Total Bilirubin: 0.4 mg/dL (ref 0.2–1.2)
Total Protein: 7.5 g/dL (ref 6.0–8.3)

## 2014-01-07 LAB — LIPID PANEL
Cholesterol: 188 mg/dL (ref 0–200)
HDL: 51.2 mg/dL (ref >39.00)
LDL Cholesterol: 110 mg/dL — ABNORMAL HIGH (ref 0–99)
NonHDL: 136.8
Total CHOL/HDL Ratio: 4
Triglycerides: 132 mg/dL (ref 0.0–149.0)
VLDL: 26.4 mg/dL (ref 0.0–40.0)

## 2014-01-07 LAB — TSH: TSH: 0.4 u[IU]/mL (ref 0.35–4.50)

## 2014-01-07 LAB — HEMOGLOBIN A1C: Hgb A1c MFr Bld: 7.7 % — ABNORMAL HIGH (ref 4.6–6.5)

## 2014-01-07 MED ORDER — SIMVASTATIN 20 MG PO TABS: 40.0000 mg | ORAL_TABLET | Freq: Every day | ORAL | Status: DC

## 2014-01-07 MED ORDER — FUROSEMIDE 40 MG PO TABS: 80.0000 mg | ORAL_TABLET | Freq: Every day | ORAL | Status: DC

## 2014-01-07 NOTE — Patient Instructions (Signed)
Diabetes and Exercise Exercising regularly is important. It is not just about losing weight. It has many health benefits, such as:  Improving your overall fitness, flexibility, and endurance.  Increasing your bone density.  Helping with weight control.  Decreasing your body fat.  Increasing your muscle strength.  Reducing stress and tension.  Improving your overall health. People with diabetes who exercise gain additional benefits because exercise:  Reduces appetite.  Improves the body's use of blood sugar (glucose).  Helps lower or control blood glucose.  Decreases blood pressure.  Helps control blood lipids (such as cholesterol and triglycerides).  Improves the body's use of the hormone insulin by:  Increasing the body's insulin sensitivity.  Reducing the body's insulin needs.  Decreases the risk for heart disease because exercising:  Lowers cholesterol and triglycerides levels.  Increases the levels of good cholesterol (such as high-density lipoproteins [HDL]) in the body.  Lowers blood glucose levels. YOUR ACTIVITY PLAN  Choose an activity that you enjoy and set realistic goals. Your health care provider or diabetes educator can help you make an activity plan that works for you. Exercise regularly as directed by your health care provider. This includes:  Performing resistance training twice a week such as push-ups, sit-ups, lifting weights, or using resistance bands.  Performing 150 minutes of cardio exercises each week such as walking, running, or playing sports.  Staying active and spending no more than 90 minutes at one time being inactive. Even short bursts of exercise are good for you. Three 10-minute sessions spread throughout the day are just as beneficial as a single 30-minute session. Some exercise ideas include:  Taking the dog for a walk.  Taking the stairs instead of the elevator.  Dancing to your favorite song.  Doing an exercise  video.  Doing your favorite exercise with a friend. RECOMMENDATIONS FOR EXERCISING WITH TYPE 1 OR TYPE 2 DIABETES   Check your blood glucose before exercising. If blood glucose levels are greater than 240 mg/dL, check for urine ketones. Do not exercise if ketones are present.  Avoid injecting insulin into areas of the body that are going to be exercised. For example, avoid injecting insulin into:  The arms when playing tennis.  The legs when jogging.  Keep a record of:  Food intake before and after you exercise.  Expected peak times of insulin action.  Blood glucose levels before and after you exercise.  The type and amount of exercise you have done.  Review your records with your health care provider. Your health care provider will help you to develop guidelines for adjusting food intake and insulin amounts before and after exercising.  If you take insulin or oral hypoglycemic agents, watch for signs and symptoms of hypoglycemia. They include:  Dizziness.  Shaking.  Sweating.  Chills.  Confusion.  Drink plenty of water while you exercise to prevent dehydration or heat stroke. Body water is lost during exercise and must be replaced.  Talk to your health care provider before starting an exercise program to make sure it is safe for you. Remember, almost any type of activity is better than none. Document Released: 06/08/2003 Document Revised: 08/02/2013 Document Reviewed: 08/25/2012 Exit[employer] Patient Information 2015 [employer]. This information is not intended to replace advice given to you by your health care provider. Make sure you discuss any questions you have with your health care provider.  

## 2014-01-07 NOTE — Progress Notes (Signed)
Subjective:    Patient ID: Marissa Roberts, female    DOB: 04/13/1945, 68 y.o.   MRN: [id number]  HPI 68 year old African American female, nonsmoker with a history of hypertension, hyperlipidemia, hypothyroid and hypokalemia is in today for recheck. Reports doing well. Not actively exercising. Blood glucoses have been running 119 and 203. Tolerate medications well except has difficulty swallowing potassium. Does not want to change to a liquid or powder. Has concerns that Vytorin is too expensive and would like a cheaper option.   Review of Systems  Constitutional: Negative.   HENT: Negative.   Respiratory: Negative.   Cardiovascular: Negative.   Gastrointestinal: Negative.   Endocrine: Negative.   Genitourinary: Negative.   Musculoskeletal: Negative.   Skin: Negative.   Allergic/Immunologic: Negative.   Neurological: Negative.   Hematological: Negative.   Psychiatric/Behavioral: Negative.    Past Medical History  Diagnosis Date  . Hypertension   . Thyroid disease   . Diabetes mellitus   . CHF (congestive heart failure)   . PONV (postoperative nausea and vomiting)   . Hypothyroidism   . Pneumonia     hx of  . Bronchitis     hx of  . GERD (gastroesophageal reflux disease)   . Arthritis   . Hypercholesteremia     History   Social History  . Marital Status: Married    Spouse Name: N/A    Number of Children: N/A  . Years of Education: N/A   Occupational History  . Not on file.   Social History Main Topics  . Smoking status: Former Smoker    Quit date: 04/01/1990  . Smokeless tobacco: Never Used  . Alcohol Use: No  . Drug Use: No  . Sexual Activity: Not on file   Other Topics Concern  . Not on file   Social History Narrative  . No narrative on file    Past Surgical History  Procedure Laterality Date  . Knee arhtroscopy    . Carpel tunnel    . Foot surgery    . Abdominal hysterectomy      partial  . Tonsillectomy    . Breast surgery     lumpectomy left breast  . Colonoscopy w/ polypectomy    . Shoulder arthroscopy with subacromial decompression, rotator cuff repair and bicep tendon repair Right 05/26/2012    Procedure: Right Shoulder Diagnostic Operative Arthroscopy, Subacromial Decompression, Biceps Tenodesis, Mini Open Rotator Cuff Repair;  Surgeon: [name], MD;  Location: [place];  Service: Orthopedics;  Laterality: Right;  Right Shoulder Diagnostic Operative Arthroscopy, Subacromial Decompression, Biceps Tenodesis, Mini Open Rotator Cuff Repair    Family History  Problem Relation Age of Onset  . Adopted: Yes  . Coronary artery disease    . Diabetes Mother   . Heart disease Mother   . Hyperlipidemia Mother     Allergies  Allergen Reactions  . Contrast Media [Iodinated Diagnostic Agents]     Throat swells from ct con tra  . Adhesive [Tape]     No paper tape, causes blisters  . Erythromycin     REACTION: rash  . Penicillins     REACTION: rash    Current Outpatient Prescriptions on File Prior to Visit  Medication Sig Dispense Refill  . amLODipine-benazepril (LOTREL) 5-40 MG per capsule Take 1 capsule by mouth daily.  90 capsule  2  . Calcium Carbonate (CALCIUM 600) 1500 MG TABS Take 2 tablets by mouth daily.       Marland Kitchen FREESTYLE LITE  test strip USE TWICE DAILY TO TEST BLOOD SUGAR  100 each  4  . glucosamine-chondroitin 500-400 MG tablet Take 1 tablet by mouth daily.        . insulin aspart (NOVOLOG) 100 UNIT/ML FlexPen Inject 12 Units into the skin 3 (three) times daily with meals.  45 mL  0  . Insulin Pen Needle (B-D ULTRAFINE III SHORT PEN) 31G X 8 MM MISC 1 each by Does not apply route daily. To use with lantus  100 each  3  . LANTUS SOLOSTAR 100 UNIT/ML Solostar Pen INJECT 55 UNITS INTO THE SKIN AT BEDTIME  4 pen  5  . levothyroxine (SYNTHROID, LEVOTHROID) 137 MCG tablet TAKE ONE TABLET BY MOUTH ONCE DAILY  90 tablet  3  . metFORMIN (GLUCOPHAGE) 1000 MG tablet Take 1 tablet (1,000 mg total) by mouth 2  (two) times daily with a meal.  60 tablet  11  . omeprazole (PRILOSEC) 20 MG capsule Take 40 mg by mouth daily.       . potassium chloride (K-DUR) 10 MEQ tablet Take 1 tablet (10 mEq total) by mouth 2 (two) times daily.  180 tablet  3  . traMADol (ULTRAM) 50 MG tablet Take 50 mg by mouth every 6 (six) hours as needed.       . [DISCONTINUED] Saxagliptin-Metformin 07-998 MG TB24 Take 1 tablet by mouth daily.  30 tablet     No current facility-administered medications on file prior to visit.    BP 132/78  Pulse 74  Ht 5\' 5"  (1.651 m)  Wt 240 lb (108.863 kg)  BMI 39.94 kg/m2chart    Objective:   Physical Exam  Constitutional: She is oriented to person, place, and time. She appears well-developed and well-nourished.  HENT:  Right Ear: External ear normal.  Left Ear: External ear normal.  Nose: Nose normal.  Mouth/Throat: Oropharynx is clear and moist.  Neck: Normal range of motion. Neck supple. No thyromegaly present.  Cardiovascular: Normal rate, regular rhythm and normal heart sounds.   Pulmonary/Chest: Effort normal and breath sounds normal.  Abdominal: Soft. Bowel sounds are normal.  Musculoskeletal: Normal range of motion.  Neurological: She is alert and oriented to person, place, and time.  Skin: Skin is warm and dry.  Psychiatric: She has a normal mood and affect.          Assessment & Plan:  [name] was seen today for follow-up.  Diagnoses and associated orders for this visit:  Type 2 diabetes mellitus, uncontrolled - Hemoglobin T7D - Basic Metabolic Panel - Hepatic Function Panel  Hypokalemia - Basic Metabolic Panel - Hepatic Function Panel  Hypothyroidism, unspecified hypothyroidism type - TSH  Pure hypercholesterolemia - Lipid Panel - Basic Metabolic Panel - Hepatic Function Panel  Other Orders - furosemide (LASIX) 40 MG tablet; Take 2 tablets (80 mg total) by mouth daily. - simvastatin (ZOCOR) 20 MG tablet; Take 2 tablets (40 mg total) by mouth at  bedtime.    call the office with any questions or concerns. Recheck pending labs, in 4 months, sooner as needed. Consider potassium every other day pending labs.

## 2014-01-07 NOTE — Progress Notes (Signed)
Pre visit review using our clinic review tool, if applicable. No additional management support is needed unless otherwise documented below in the visit note. 

## 2014-01-17 ENCOUNTER — Telehealth: Payer: Self-pay | Admitting: Family

## 2014-01-17 MED ORDER — METFORMIN HCL 1000 MG PO TABS: 1000.0000 mg | ORAL_TABLET | Freq: Two times a day (BID) | ORAL | Status: DC

## 2014-01-17 NOTE — Telephone Encounter (Signed)
[facility] is requesting re-fill on metFORMIN (GLUCOPHAGE) 1000 MG tablet

## 2014-01-17 NOTE — Telephone Encounter (Signed)
Pt called stating [facility] needs more information about her simvastatin rx.  I spoke to [facility] and she has the fax from [place] [facility] and has already responded via fax.   (vytorin has been d/c and pt is to take simvastatin)

## 2014-01-19 ENCOUNTER — Other Ambulatory Visit: Payer: Self-pay

## 2014-01-19 MED ORDER — SIMVASTATIN 20 MG PO TABS: 40.0000 mg | ORAL_TABLET | Freq: Every day | ORAL | Status: DC

## 2014-02-09 ENCOUNTER — Telehealth: Payer: Self-pay | Admitting: Family

## 2014-02-09 MED ORDER — SIMVASTATIN 20 MG PO TABS: 20.0000 mg | ORAL_TABLET | Freq: Every day | ORAL | Status: DC

## 2014-02-09 MED ORDER — SIMVASTATIN 20 MG PO TABS: 40.0000 mg | ORAL_TABLET | Freq: Every day | ORAL | Status: DC

## 2014-02-09 NOTE — Telephone Encounter (Signed)
Spoke with pharmacist at [facility]. Pharmacists wanted to be sure PCP was aware of the interaction for pt to take 40mg  os simvistatin with the amlodipine.   Rx corrected. Per Abby Potash, Rx should be for pt to take 20mg  qd. 30 days supply sent to [facility] and 90 day supply sent to [facility]

## 2014-02-09 NOTE — Telephone Encounter (Signed)
[facility] states [facility] will not refill her simvastatin (ZOCOR) 20 MG tablet  Pt states they need to speak w/ the dr before they will fill. Pt states she never received this med. [facility] told [facility] they have contacted [place] several times bc there is an interaction between simvastatin and another med she is on.  But I do not see any documentation Pt has only one tab left and will need a 30 day to get her through until her refill comes in. [employer]  Pt is very concerned and doesn't know why we did not address. Told pt no record they call for this. [phone] (no to call to [facility])

## 2014-02-28 ENCOUNTER — Ambulatory Visit (INDEPENDENT_AMBULATORY_CARE_PROVIDER_SITE_OTHER): Payer: Medicare Other | Admitting: Family

## 2014-02-28 ENCOUNTER — Encounter: Payer: Self-pay | Admitting: Family

## 2014-02-28 ENCOUNTER — Ambulatory Visit (INDEPENDENT_AMBULATORY_CARE_PROVIDER_SITE_OTHER): Payer: Medicare Other

## 2014-02-28 VITALS — BP 140/80 | HR 94 | Ht 65.0 in | Wt 238.7 lb

## 2014-02-28 DIAGNOSIS — E119 Type 2 diabetes mellitus without complications: Secondary | ICD-10-CM

## 2014-02-28 DIAGNOSIS — R3989 Other symptoms and signs involving the genitourinary system: Secondary | ICD-10-CM

## 2014-02-28 DIAGNOSIS — Z23 Encounter for immunization: Secondary | ICD-10-CM

## 2014-02-28 LAB — POCT URINALYSIS DIPSTICK
BILIRUBIN UA: NEGATIVE
Glucose, UA: NEGATIVE
Ketones, UA: NEGATIVE
LEUKOCYTES UA: NEGATIVE
NITRITE UA: NEGATIVE
RBC UA: NEGATIVE
Spec Grav, UA: 1.01
UROBILINOGEN UA: 0.2
pH, UA: 7

## 2014-02-28 LAB — MICROALBUMIN / CREATININE URINE RATIO
CREATININE, U: 24.5 mg/dL
MICROALB/CREAT RATIO: 87.9 mg/g — AB (ref 0.0–30.0)
Microalb, Ur: 21.5 mg/dL — ABNORMAL HIGH (ref 0.0–1.9)

## 2014-02-28 NOTE — Progress Notes (Signed)
Subjective:    Patient ID: Marissa Roberts, female    DOB: 07-Jan-1946, 68 y.o.   MRN: [id number]  HPI  68 year old African-American female, nonsmoker is in today requesting to have a urine microalbumin as advised by her insurance company. She has a history of type 2 diabetes, hyperlipidemia, hypertension, hypothyroidism. Otherwise doing well.  Also like her urine checked because it is discolored.  Review of Systems  Constitutional: Negative.   HENT: Negative.   Respiratory: Negative.   Cardiovascular: Negative.   Gastrointestinal: Negative.   Endocrine: Negative.   Genitourinary: Negative.   Musculoskeletal: Negative.   Skin: Negative.   Hematological: Negative.    Past Medical History  Diagnosis Date  . Hypertension   . Thyroid disease   . Diabetes mellitus   . CHF (congestive heart failure)   . PONV (postoperative nausea and vomiting)   . Hypothyroidism   . Pneumonia     hx of  . Bronchitis     hx of  . GERD (gastroesophageal reflux disease)   . Arthritis   . Hypercholesteremia     History   Social History  . Marital Status: Married    Spouse Name: N/A    Number of Children: N/A  . Years of Education: N/A   Occupational History  . Not on file.   Social History Main Topics  . Smoking status: Former Smoker    Quit date: 04/01/1990  . Smokeless tobacco: Never Used  . Alcohol Use: No  . Drug Use: No  . Sexual Activity: Not on file   Other Topics Concern  . Not on file   Social History Narrative    Past Surgical History  Procedure Laterality Date  . Knee arhtroscopy    . Carpel tunnel    . Foot surgery    . Abdominal hysterectomy      partial  . Tonsillectomy    . Breast surgery      lumpectomy left breast  . Colonoscopy w/ polypectomy    . Shoulder arthroscopy with subacromial decompression, rotator cuff repair and bicep tendon repair Right 05/26/2012    Procedure: Right Shoulder Diagnostic Operative Arthroscopy, Subacromial Decompression,  Biceps Tenodesis, Mini Open Rotator Cuff Repair;  Surgeon: [name], MD;  Location: [facility];  Service: Orthopedics;  Laterality: Right;  Right Shoulder Diagnostic Operative Arthroscopy, Subacromial Decompression, Biceps Tenodesis, Mini Open Rotator Cuff Repair    Family History  Problem Relation Age of Onset  . Adopted: Yes  . Coronary artery disease    . Diabetes Mother   . Heart disease Mother   . Hyperlipidemia Mother     Allergies  Allergen Reactions  . Contrast Media [Iodinated Diagnostic Agents]     Throat swells from ct con tra  . Adhesive [Tape]     No paper tape, causes blisters  . Erythromycin     REACTION: rash  . Penicillins     REACTION: rash    Current Outpatient Prescriptions on File Prior to Visit  Medication Sig Dispense Refill  . amLODipine-benazepril (LOTREL) 5-40 MG per capsule Take 1 capsule by mouth daily. 90 capsule 2  . Calcium Carbonate (CALCIUM 600) 1500 MG TABS Take 2 tablets by mouth daily.     Marland Kitchen FREESTYLE LITE test strip USE TWICE DAILY TO TEST BLOOD SUGAR 100 each 4  . furosemide (LASIX) 40 MG tablet Take 2 tablets (80 mg total) by mouth daily. 360 tablet 1  . glucosamine-chondroitin 500-400 MG tablet Take 1 tablet by  mouth daily.      . insulin aspart (NOVOLOG) 100 UNIT/ML FlexPen Inject 12 Units into the skin 3 (three) times daily with meals. 45 mL 0  . Insulin Pen Needle (B-D ULTRAFINE III SHORT PEN) 31G X 8 MM MISC 1 each by Does not apply route daily. To use with lantus 100 each 3  . LANTUS SOLOSTAR 100 UNIT/ML Solostar Pen INJECT 55 UNITS INTO THE SKIN AT BEDTIME 4 pen 5  . levothyroxine (SYNTHROID, LEVOTHROID) 137 MCG tablet TAKE ONE TABLET BY MOUTH ONCE DAILY 90 tablet 3  . metFORMIN (GLUCOPHAGE) 1000 MG tablet Take 1 tablet (1,000 mg total) by mouth 2 (two) times daily with a meal. 180 tablet 1  . omeprazole (PRILOSEC) 20 MG capsule Take 40 mg by mouth daily.     . potassium chloride (K-DUR) 10 MEQ tablet Take 1 tablet (10 mEq  total) by mouth 2 (two) times daily. 180 tablet 3  . simvastatin (ZOCOR) 20 MG tablet Take 1 tablet (20 mg total) by mouth at bedtime. 30 tablet 0  . traMADol (ULTRAM) 50 MG tablet Take 50 mg by mouth every 6 (six) hours as needed.     . [DISCONTINUED] Saxagliptin-Metformin 07-998 MG TB24 Take 1 tablet by mouth daily. 30 tablet    No current facility-administered medications on file prior to visit.    BP 140/80 mmHg  Pulse 94  Ht 5\' 5"  (1.651 m)  Wt 238 lb 11.2 oz (108.274 kg)  BMI 39.72 kg/m2chart    Objective:   Physical Exam  Constitutional: She appears well-developed and well-nourished.  HENT:  Right Ear: External ear normal.  Left Ear: External ear normal.  Nose: Nose normal.  Mouth/Throat: Oropharynx is clear and moist.  Neck: Normal range of motion. Neck supple. No thyromegaly present.  Cardiovascular: Normal rate, regular rhythm and normal heart sounds.   Pulmonary/Chest: Effort normal and breath sounds normal.  Abdominal: Soft. Bowel sounds are normal.  Musculoskeletal: Normal range of motion.  Neurological: She is alert.  Skin: Skin is warm and dry.  Psychiatric: She has a normal mood and affect.          Assessment & Plan:  [name] was seen today for no specified reason.  Diagnoses and associated orders for this visit:  Type 2 diabetes mellitus without complication - Microalbumin/Creatinine Ratio, Urine - POCT urinalysis dipstick  Urine discoloration - POCT urinalysis dipstick    called the office with any questions or concerns. Recheck as scheduled in 3 months and sooner as needed.

## 2014-02-28 NOTE — Progress Notes (Signed)
Pre visit review using our clinic review tool, if applicable. No additional management support is needed unless otherwise documented below in the visit note. 

## 2014-02-28 NOTE — Patient Instructions (Signed)
Diabetes and Standards of Medical Care Diabetes is complicated. You may find that your diabetes team includes a dietitian, nurse, diabetes educator, eye doctor, and more. To help everyone know what is going on and to help you get the care you deserve, the following schedule of care was developed to help keep you on track. Below are the tests, exams, vaccines, medicines, education, and plans you will need. HbA1c test This test shows how well you have controlled your glucose over the past 2-3 months. It is used to see if your diabetes management plan needs to be adjusted.   It is performed at least 2 times a year if you are meeting treatment goals.  It is performed 4 times a year if therapy has changed or if you are not meeting treatment goals. Blood pressure test  This test is performed at every routine medical visit. The goal is less than 140/90 mm Hg for most people, but 130/80 mm Hg in some cases. Ask your health care provider about your goal. Dental exam  Follow up with the dentist regularly. Eye exam  If you are diagnosed with type 1 diabetes as a child, get an exam upon reaching the age of 37 years or older and have had diabetes for 3-5 years. Yearly eye exams are recommended after that initial eye exam.  If you are diagnosed with type 1 diabetes as an adult, get an exam within 5 years of diagnosis and then yearly.  If you are diagnosed with type 2 diabetes, get an exam as soon as possible after the diagnosis and then yearly. Foot care exam  Visual foot exams are performed at every routine medical visit. The exams check for cuts, injuries, or other problems with the feet.  A comprehensive foot exam should be done yearly. This includes visual inspection as well as assessing foot pulses and testing for loss of sensation.  Check your feet nightly for cuts, injuries, or other problems with your feet. Tell your health care provider if anything is not healing. Kidney function test (urine  microalbumin)  This test is performed once a year.  Type 1 diabetes: The first test is performed 5 years after diagnosis.  Type 2 diabetes: The first test is performed at the time of diagnosis.  A serum creatinine and estimated glomerular filtration rate (eGFR) test is done once a year to assess the level of chronic kidney disease (CKD), if present. Lipid profile (cholesterol, HDL, LDL, triglycerides)  Performed every 5 years for most people.  The goal for LDL is less than 100 mg/dL. If you are at high risk, the goal is less than 70 mg/dL.  The goal for HDL is 40 mg/dL-50 mg/dL for men and 50 mg/dL-60 mg/dL for women. An HDL cholesterol of 60 mg/dL or higher gives some protection against heart disease.  The goal for triglycerides is less than 150 mg/dL. Influenza vaccine, pneumococcal vaccine, and hepatitis B vaccine  The influenza vaccine is recommended yearly.  It is recommended that people with diabetes who are over 24 years old get the pneumonia vaccine. In some cases, two separate shots may be given. Ask your health care provider if your pneumonia vaccination is up to date.  The hepatitis B vaccine is also recommended for adults with diabetes. Diabetes self-management education  Education is recommended at diagnosis and ongoing as needed. Treatment plan  Your treatment plan is reviewed at every medical visit. Document Released: 01/13/2009 Document Revised: 08/02/2013 Document Reviewed: 08/18/2012 [facility] Patient Information 2015 [place],  [employer]. This information is not intended to replace advice given to you by your health care provider. Make sure you discuss any questions you have with your health care provider.  

## 2014-04-08 ENCOUNTER — Ambulatory Visit: Payer: Medicare Other | Admitting: Family

## 2014-05-19 ENCOUNTER — Other Ambulatory Visit: Payer: Self-pay

## 2014-05-19 MED ORDER — POTASSIUM CHLORIDE ER 10 MEQ PO TBCR: 10.0000 meq | EXTENDED_RELEASE_TABLET | Freq: Two times a day (BID) | ORAL | Status: DC

## 2014-05-26 ENCOUNTER — Ambulatory Visit (INDEPENDENT_AMBULATORY_CARE_PROVIDER_SITE_OTHER): Payer: Medicare Other | Admitting: Family

## 2014-05-26 ENCOUNTER — Encounter: Payer: Self-pay | Admitting: Family

## 2014-05-26 VITALS — BP 158/88 | HR 87 | Temp 98.7°F | Ht 65.0 in | Wt 241.0 lb

## 2014-05-26 DIAGNOSIS — I10 Essential (primary) hypertension: Secondary | ICD-10-CM

## 2014-05-26 DIAGNOSIS — E039 Hypothyroidism, unspecified: Secondary | ICD-10-CM

## 2014-05-26 DIAGNOSIS — E7800 Pure hypercholesterolemia, unspecified: Secondary | ICD-10-CM

## 2014-05-26 DIAGNOSIS — E119 Type 2 diabetes mellitus without complications: Secondary | ICD-10-CM

## 2014-05-26 LAB — CBC WITH DIFFERENTIAL/PLATELET
BASOS ABS: 0 10*3/uL (ref 0.0–0.1)
BASOS PCT: 0.4 % (ref 0.0–3.0)
Eosinophils Absolute: 0.3 10*3/uL (ref 0.0–0.7)
Eosinophils Relative: 2.7 % (ref 0.0–5.0)
HCT: 35.3 % — ABNORMAL LOW (ref 36.0–46.0)
Hemoglobin: 11.7 g/dL — ABNORMAL LOW (ref 12.0–15.0)
Lymphocytes Relative: 39 % (ref 12.0–46.0)
Lymphs Abs: 4.4 10*3/uL — ABNORMAL HIGH (ref 0.7–4.0)
MCHC: 33 g/dL (ref 30.0–36.0)
MCV: 78.5 fl (ref 78.0–100.0)
MONO ABS: 0.9 10*3/uL (ref 0.1–1.0)
Monocytes Relative: 7.9 % (ref 3.0–12.0)
Neutro Abs: 5.7 10*3/uL (ref 1.4–7.7)
Neutrophils Relative %: 50 % (ref 43.0–77.0)
Platelets: 306 10*3/uL (ref 150.0–400.0)
RBC: 4.5 Mil/uL (ref 3.87–5.11)
RDW: 16.4 % — AB (ref 11.5–15.5)
WBC: 11.3 10*3/uL — ABNORMAL HIGH (ref 4.0–10.5)

## 2014-05-26 LAB — HEMOGLOBIN A1C: HEMOGLOBIN A1C: 8 % — AB (ref 4.6–6.5)

## 2014-05-26 LAB — HEPATIC FUNCTION PANEL
ALK PHOS: 65 U/L (ref 39–117)
ALT: 24 U/L (ref 0–35)
AST: 21 U/L (ref 0–37)
Albumin: 4 g/dL (ref 3.5–5.2)
BILIRUBIN DIRECT: 0 mg/dL (ref 0.0–0.3)
BILIRUBIN TOTAL: 0.3 mg/dL (ref 0.2–1.2)
TOTAL PROTEIN: 7 g/dL (ref 6.0–8.3)

## 2014-05-26 LAB — LIPID PANEL
CHOLESTEROL: 217 mg/dL — AB (ref 0–200)
HDL: 56.2 mg/dL (ref >39.00)
LDL Cholesterol: 136 mg/dL — ABNORMAL HIGH (ref 0–99)
NonHDL: 160.8
Total CHOL/HDL Ratio: 4
Triglycerides: 124 mg/dL (ref 0.0–149.0)
VLDL: 24.8 mg/dL (ref 0.0–40.0)

## 2014-05-26 LAB — POCT URINALYSIS DIPSTICK
Bilirubin, UA: NEGATIVE
Glucose, UA: NEGATIVE
Ketones, UA: NEGATIVE
Leukocytes, UA: NEGATIVE
Nitrite, UA: NEGATIVE
RBC UA: NEGATIVE
SPEC GRAV UA: 1.01
Urobilinogen, UA: 0.2
pH, UA: 5

## 2014-05-26 LAB — BASIC METABOLIC PANEL
BUN: 15 mg/dL (ref 6–23)
CALCIUM: 9.8 mg/dL (ref 8.4–10.5)
CO2: 30 meq/L (ref 19–32)
CREATININE: 0.77 mg/dL (ref 0.40–1.20)
Chloride: 103 mEq/L (ref 96–112)
GFR: 96.23 mL/min (ref >60.00)
Glucose, Bld: 187 mg/dL — ABNORMAL HIGH (ref 70–99)
Potassium: 4.1 mEq/L (ref 3.5–5.1)
SODIUM: 139 meq/L (ref 135–145)

## 2014-05-26 LAB — TSH: TSH: 0.52 u[IU]/mL (ref 0.35–4.50)

## 2014-05-26 NOTE — Patient Instructions (Signed)
Diabetes and Standards of Medical Care Diabetes is complicated. You may find that your diabetes team includes a dietitian, nurse, diabetes educator, eye doctor, and more. To help everyone know what is going on and to help you get the care you deserve, the following schedule of care was developed to help keep you on track. Below are the tests, exams, vaccines, medicines, education, and plans you will need. HbA1c test This test shows how well you have controlled your glucose over the past 2-3 months. It is used to see if your diabetes management plan needs to be adjusted.   It is performed at least 2 times a year if you are meeting treatment goals.  It is performed 4 times a year if therapy has changed or if you are not meeting treatment goals. Blood pressure test  This test is performed at every routine medical visit. The goal is less than 140/90 mm Hg for most people, but 130/80 mm Hg in some cases. Ask your health care provider about your goal. Dental exam  Follow up with the dentist regularly. Eye exam  If you are diagnosed with type 1 diabetes as a child, get an exam upon reaching the age of 37 years or older and have had diabetes for 3-5 years. Yearly eye exams are recommended after that initial eye exam.  If you are diagnosed with type 1 diabetes as an adult, get an exam within 5 years of diagnosis and then yearly.  If you are diagnosed with type 2 diabetes, get an exam as soon as possible after the diagnosis and then yearly. Foot care exam  Visual foot exams are performed at every routine medical visit. The exams check for cuts, injuries, or other problems with the feet.  A comprehensive foot exam should be done yearly. This includes visual inspection as well as assessing foot pulses and testing for loss of sensation.  Check your feet nightly for cuts, injuries, or other problems with your feet. Tell your health care provider if anything is not healing. Kidney function test (urine  microalbumin)  This test is performed once a year.  Type 1 diabetes: The first test is performed 5 years after diagnosis.  Type 2 diabetes: The first test is performed at the time of diagnosis.  A serum creatinine and estimated glomerular filtration rate (eGFR) test is done once a year to assess the level of chronic kidney disease (CKD), if present. Lipid profile (cholesterol, HDL, LDL, triglycerides)  Performed every 5 years for most people.  The goal for LDL is less than 100 mg/dL. If you are at high risk, the goal is less than 70 mg/dL.  The goal for HDL is 40 mg/dL-50 mg/dL for men and 50 mg/dL-60 mg/dL for women. An HDL cholesterol of 60 mg/dL or higher gives some protection against heart disease.  The goal for triglycerides is less than 150 mg/dL. Influenza vaccine, pneumococcal vaccine, and hepatitis B vaccine  The influenza vaccine is recommended yearly.  It is recommended that people with diabetes who are over 24 years old get the pneumonia vaccine. In some cases, two separate shots may be given. Ask your health care provider if your pneumonia vaccination is up to date.  The hepatitis B vaccine is also recommended for adults with diabetes. Diabetes self-management education  Education is recommended at diagnosis and ongoing as needed. Treatment plan  Your treatment plan is reviewed at every medical visit. Document Released: 01/13/2009 Document Revised: 08/02/2013 Document Reviewed: 08/18/2012 [facility] Patient Information 2015 [place],  [employer]. This information is not intended to replace advice given to you by your health care provider. Make sure you discuss any questions you have with your health care provider.  

## 2014-05-26 NOTE — Progress Notes (Signed)
Pre visit review using our clinic review tool, if applicable. No additional management support is needed unless otherwise documented below in the visit note. 

## 2014-05-26 NOTE — Progress Notes (Signed)
   Subjective:    Patient ID: Marissa Roberts, female    DOB: 1945-07-23, 68 y.o.   MRN: [id number]  HPI  69 year old African-American female, nonsmoker with a history of hypothyroidism, type 2 diabetes, hyperlipidemia, hypertension, and obesity is in today for recheck. Reports glucoses are running higher than usual around 266. After exercise, glucose drops down to the 2:30 range. Reports feeling dry. Has had mild cough and congestion over the last 2 days. Denies any fever or chills. Currently taking 50 units of Lantus in the evenings. At her last office visit, she was instructed to increase to 55 units but due to cost, she did not increase.  Review of Systems  Constitutional: Negative.   HENT: Negative.   Respiratory: Negative.   Cardiovascular: Negative.   Gastrointestinal: Negative.   Endocrine: Negative.   Genitourinary: Negative.   Musculoskeletal: Negative.   Skin: Negative.   Allergic/Immunologic: Negative.   Neurological: Negative.   Hematological: Negative.   Psychiatric/Behavioral: Negative.        Objective:   Physical Exam  Constitutional: She is oriented to person, place, and time. She appears well-developed and well-nourished.  HENT:  Right Ear: External ear normal.  Left Ear: External ear normal.  Nose: Nose normal.  Mouth/Throat: Oropharynx is clear and moist.  Neck: Normal range of motion. Neck supple.  Cardiovascular: Normal rate, regular rhythm and normal heart sounds.   Pulmonary/Chest: Effort normal and breath sounds normal.  Abdominal: Soft. Bowel sounds are normal.  Musculoskeletal: Normal range of motion.  Neurological: She is alert and oriented to person, place, and time.  Skin: Skin is warm and dry.  Psychiatric: She has a normal mood and affect.          Assessment & Plan:  [name] was seen today for follow-up.  Diagnoses and all orders for this visit:  Essential hypertension Orders: -     Hemoglobin A1c -     Basic Metabolic Panel -      Hepatic Function Panel -     Lipid Panel -     CBC with Differential -     POC Urinalysis Dipstick  Type 2 diabetes mellitus without complication Orders: -     Hemoglobin A1c -     Basic Metabolic Panel -     Hepatic Function Panel -     Lipid Panel -     CBC with Differential -     POC Urinalysis Dipstick  Hypothyroidism, unspecified hypothyroidism type Orders: -     Hemoglobin A1c -     Basic Metabolic Panel -     Hepatic Function Panel -     Lipid Panel -     CBC with Differential -     POC Urinalysis Dipstick -     TSH  Pure hypercholesterolemia Orders: -     Hemoglobin A1c -     Basic Metabolic Panel -     Hepatic Function Panel -     Lipid Panel -     CBC with Differential -     POC Urinalysis Dipstick   Patient requesting to see a different endocrinologist. Her current one is out of network. Continue current medications. Encouraged healthy diet and exercise. Follow-up here in 4 months and sooner as needed.

## 2014-05-30 ENCOUNTER — Ambulatory Visit: Payer: Medicare Other | Admitting: Family

## 2014-05-31 ENCOUNTER — Other Ambulatory Visit: Payer: Self-pay

## 2014-05-31 DIAGNOSIS — E119 Type 2 diabetes mellitus without complications: Secondary | ICD-10-CM

## 2014-06-07 ENCOUNTER — Other Ambulatory Visit: Payer: Self-pay | Admitting: *Deleted

## 2014-06-07 DIAGNOSIS — E039 Hypothyroidism, unspecified: Secondary | ICD-10-CM

## 2014-06-07 MED ORDER — LEVOTHYROXINE SODIUM 137 MCG PO TABS: ORAL_TABLET | ORAL | Status: DC

## 2014-06-09 ENCOUNTER — Encounter: Payer: Self-pay | Admitting: Endocrinology

## 2014-06-09 ENCOUNTER — Ambulatory Visit (INDEPENDENT_AMBULATORY_CARE_PROVIDER_SITE_OTHER): Payer: Medicare Other | Admitting: Endocrinology

## 2014-06-09 ENCOUNTER — Other Ambulatory Visit: Payer: Self-pay | Admitting: *Deleted

## 2014-06-09 VITALS — BP 170/88 | HR 72 | Temp 98.6°F | Resp 16 | Ht 65.0 in | Wt 237.0 lb

## 2014-06-09 DIAGNOSIS — E039 Hypothyroidism, unspecified: Secondary | ICD-10-CM

## 2014-06-09 DIAGNOSIS — IMO0002 Reserved for concepts with insufficient information to code with codable children: Secondary | ICD-10-CM

## 2014-06-09 DIAGNOSIS — E1165 Type 2 diabetes mellitus with hyperglycemia: Secondary | ICD-10-CM

## 2014-06-09 DIAGNOSIS — E785 Hyperlipidemia, unspecified: Secondary | ICD-10-CM

## 2014-06-09 DIAGNOSIS — E1169 Type 2 diabetes mellitus with other specified complication: Secondary | ICD-10-CM

## 2014-06-09 DIAGNOSIS — I10 Essential (primary) hypertension: Secondary | ICD-10-CM

## 2014-06-09 MED ORDER — GLUCOSE BLOOD VI STRP: ORAL_STRIP | Status: DC

## 2014-06-09 MED ORDER — ONETOUCH DELICA LANCETS FINE MISC: Status: DC

## 2014-06-09 NOTE — Patient Instructions (Addendum)
Please check blood sugars at least half the time about 2 hours after any meal and 3 times per week on waking up.   Please bring blood sugar monitor to each visit.  Recommended blood sugar levels about 2 hours after meal is 140-180 and on waking up 90-130  TAKE Novolog 5-10 min BEFORE MEALS.  May take 14-15 units for larger meals or meals that have more carbohydrate or fried items at suppertime If blood sugar is over 180 after breakfast or lunch may need to take 4-5 units for other meals also  Check the coverage of V-go pump  Take simvastatin at suppertime daily

## 2014-06-09 NOTE — Progress Notes (Addendum)
Patient ID: Marissa Roberts, female   DOB: [birth year], 69 y.o.   MRN: [id number]           Reason for Appointment: Consultation for Type 2 Diabetes  Referring physician: Megan Salon  History of Present Illness:          Diagnosis: Type 2 diabetes mellitus, date of diagnosis: 2005       Past history:  She thinks her blood sugars were only mildly increased at borderline levels at the onsetper Most likely she was treated with metformin initially and this has been continued.   At some point she was also tried on [place] and [place] for improving her control. She is not sure why she was started on insulin 5 years ago, presumably for worsening hyperglycemia Also not clear if she has tried various insulin regimens before starting Lantus and NovoLog Her A1c has been usually over 7% and gradually increasing over the last year  Recent history:  She is taking a regimen of Lantus 50 units at night although she is supposed to be taking 55 units.  She is concerned about the high cost of the medication and does not take the full dose. She is also supposed to take NovoLog with her meals but she will generally take it in the evening at suppertime only She may sometimes take it before or sometimes after eating; usually if she is eating out she will take insulin when she comes back home She does not want to take it regularly because of the cost Currently he is checking her blood sugar with a generic monitor which cannot be downloaded and recent test strips appear to be outdated. She checks her blood sugar only in the morning before breakfast       Oral hypoglycemic drugs the patient is taking are: Metformin 2 g daily    Side effects from medications have been:none INSULIN regimen is described as: Lantus 50 hs, Novolog 12 ac or pcs Compliance with the medical regimen: poor Hypoglycemia: rare if late for meal   Glucose monitoring:  done one time a day         Glucometer: Optium.      Blood Glucose  readings by time of day recently  PREMEAL Breakfast Lunch Dinner Bedtime  Overall   Glucose range: 106-200      Median:        Self-care: The diet that the patient has been following is: tries to limit sweetsplan drinks with sugar.     Meals: 3 meals per day. Breakfast is toast, bacon, egg; usually not a lot of carbohydrates, some fried food including fish.  Not always restricting fat intake like gravy and meat          Exercise: 4-5/7days a week for about an hour at the gym in the mornings         Dietician visit, most recent:2014.               Weight history:  Wt Readings from Last 3 Encounters:  06/09/14 237 lb (107.502 kg)  05/26/14 241 lb (109.317 kg)  02/28/14 238 lb 11.2 oz (108.274 kg)    Glycemic control:   Lab Results  Component Value Date   HGBA1C 8.0* 05/26/2014   HGBA1C 7.7* 01/07/2014   HGBA1C 7.5* 08/25/2013   Lab Results  Component Value Date   MICROALBUR 21.5* 02/28/2014   LDLCALC 136* 05/26/2014   CREATININE 0.77 05/26/2014         Medication List  This list is accurate as of: 06/09/14  3:54 PM.  Always use your most recent med list.               amLODipine-benazepril 5-40 MG per capsule  Commonly known as:  LOTREL  Take 1 capsule by mouth daily.     CALCIUM 600 1500 (600 CA) MG Tabs  Generic drug:  Calcium Carbonate  Take 2 tablets by mouth daily.     furosemide 40 MG tablet  Commonly known as:  LASIX  Take 2 tablets (80 mg total) by mouth daily.     glucosamine-chondroitin 500-400 MG tablet  Take 1 tablet by mouth daily.     glucose blood test strip  Commonly known as:  ONETOUCH VERIO  Please check blood sugars at least half the time about 2 hours after any meal and 3 times per week on waking up. Dx code E11.9     insulin aspart 100 UNIT/ML FlexPen  Commonly known as:  NOVOLOG  Inject 12 Units into the skin 3 (three) times daily with meals.     Insulin Pen Needle 31G X 8 MM Misc  Commonly known as:  B-D ULTRAFINE III  SHORT PEN  1 each by Does not apply route daily. To use with lantus     LANTUS SOLOSTAR 100 UNIT/ML Solostar Pen  Generic drug:  Insulin Glargine  INJECT 55 UNITS INTO THE SKIN AT BEDTIME     levothyroxine 137 MCG tablet  Commonly known as:  SYNTHROID, LEVOTHROID  TAKE ONE TABLET BY MOUTH ONCE DAILY     LINZESS 290 MCG Caps capsule  Generic drug:  Linaclotide     metFORMIN 1000 MG tablet  Commonly known as:  GLUCOPHAGE  Take 1 tablet (1,000 mg total) by mouth 2 (two) times daily with a meal.     omeprazole 20 MG capsule  Commonly known as:  PRILOSEC  Take 40 mg by mouth daily.     ONETOUCH DELICA LANCETS FINE Misc  Please check blood sugars at least half the time about 2 hours after any meal and 3 times per week on waking up. Dx code E11.9     potassium chloride 10 MEQ tablet  Commonly known as:  K-DUR  Take 1 tablet (10 mEq total) by mouth 2 (two) times daily.     simvastatin 20 MG tablet  Commonly known as:  ZOCOR  Take 1 tablet (20 mg total) by mouth at bedtime.     traMADol 50 MG tablet  Commonly known as:  ULTRAM  Take 50 mg by mouth every 6 (six) hours as needed.        Allergies:  Allergies  Allergen Reactions  . Contrast Media [Iodinated Diagnostic Agents]     Throat swells from ct con tra  . Adhesive [Tape]     No paper tape, causes blisters  . Erythromycin     REACTION: rash  . Penicillins     REACTION: rash    Past Medical History  Diagnosis Date  . Hypertension   . Thyroid disease   . Diabetes mellitus   . CHF (congestive heart failure)   . PONV (postoperative nausea and vomiting)   . Hypothyroidism   . Pneumonia     hx of  . Bronchitis     hx of  . GERD (gastroesophageal reflux disease)   . Arthritis   . Hypercholesteremia     Past Surgical History  Procedure Laterality Date  . Knee arhtroscopy    .  Carpel tunnel    . Foot surgery    . Abdominal hysterectomy      partial  . Tonsillectomy    . Breast surgery      lumpectomy  left breast  . Colonoscopy w/ polypectomy    . Shoulder arthroscopy with subacromial decompression, rotator cuff repair and bicep tendon repair Right 05/26/2012    Procedure: Right Shoulder Diagnostic Operative Arthroscopy, Subacromial Decompression, Biceps Tenodesis, Mini Open Rotator Cuff Repair;  Surgeon: [name], MD;  Location: [facility];  Service: Orthopedics;  Laterality: Right;  Right Shoulder Diagnostic Operative Arthroscopy, Subacromial Decompression, Biceps Tenodesis, Mini Open Rotator Cuff Repair    Family History  Problem Relation Age of Onset  . Adopted: Yes  . Coronary artery disease    . Diabetes Mother   . Heart disease Mother   . Hyperlipidemia Mother     Social History:  reports that she quit smoking about 24 years ago. She has never used smokeless tobacco. She reports that she does not drink alcohol or use illicit drugs.    Review of Systems       Vision is normal. Most recent eye exam was 6/15       Lipids: she thinks she is taking simvastatin but not always remembering it, she thinks it is difficult to remember it at bedtime.  Lipids are poorly controlled       Lab Results  Component Value Date   CHOL 217* 05/26/2014   HDL 56.20 05/26/2014   LDLCALC 136* 05/26/2014   LDLDIRECT 129.5 02/06/2012   TRIG 124.0 05/26/2014   CHOLHDL 4 05/26/2014                  Skin: No rash or infections     Thyroid:  No  unusual fatigue.  She thinks she has been hypothyroid for about 40 years and is regular with her supplement of 137 g Levothroid  Lab Results  Component Value Date   TSH 0.52 05/26/2014   TSH 0.40 01/07/2014   TSH 0.37 06/09/2013   FREET4 1.03 06/28/2010   FREET4 1.36 11/27/2007        The blood pressure has been difficult to control and was high with PCP also.  Currently taking Lotrel only, Does not have a follow-up with PCP this month     No swelling of feet. She takes Lasix for history of edema     No shortness of breath or chest  tightness  on exertion.     Bowel habits: Normal.      No joint  pains.          No history of Numbness, tingling or burning in feet     LABS:  No visits with results within 1 Week(s) from this visit. Latest known visit with results is:  Office Visit on 05/26/2014  Component Date Value Ref Range Status  . Hgb A1c MFr Bld 05/26/2014 8.0* 4.6 - 6.5 % Final   Glycemic Control Guidelines for People with Diabetes:Non Diabetic:  <6%Goal of Therapy: <7%Additional Action Suggested:  >8%   . Sodium 05/26/2014 139  135 - 145 mEq/L Final  . Potassium 05/26/2014 4.1  3.5 - 5.1 mEq/L Final  . Chloride 05/26/2014 103  96 - 112 mEq/L Final  . CO2 05/26/2014 30  19 - 32 mEq/L Final  . Glucose, Bld 05/26/2014 187* 70 - 99 mg/dL Final  . BUN 05/26/2014 15  6 - 23 mg/dL Final  . Creatinine, Ser 05/26/2014 0.77  0.40 - 1.20 mg/dL Final  . Calcium 05/26/2014 9.8  8.4 - 10.5 mg/dL Final  . GFR 05/26/2014 96.23  >60.00 mL/min Final  . Total Bilirubin 05/26/2014 0.3  0.2 - 1.2 mg/dL Final  . Bilirubin, Direct 05/26/2014 0.0  0.0 - 0.3 mg/dL Final  . Alkaline Phosphatase 05/26/2014 65  39 - 117 U/L Final  . AST 05/26/2014 21  0 - 37 U/L Final  . ALT 05/26/2014 24  0 - 35 U/L Final  . Total Protein 05/26/2014 7.0  6.0 - 8.3 g/dL Final  . Albumin 05/26/2014 4.0  3.5 - 5.2 g/dL Final  . Cholesterol 05/26/2014 217* 0 - 200 mg/dL Final   ATP III Classification       Desirable:  < 200 mg/dL               Borderline High:  200 - 239 mg/dL          High:  > = 240 mg/dL  . Triglycerides 05/26/2014 124.0  0.0 - 149.0 mg/dL Final   Normal:  <150 mg/dLBorderline High:  150 - 199 mg/dL  . HDL 05/26/2014 56.20  >39.00 mg/dL Final  . VLDL 05/26/2014 24.8  0.0 - 40.0 mg/dL Final  . LDL Cholesterol 05/26/2014 136* 0 - 99 mg/dL Final  . Total CHOL/HDL Ratio 05/26/2014 4   Final                  Men          Women1/2 Average Risk     3.4          3.3Average Risk          5.0          4.42X Average Risk          9.6           7.13X Average Risk          15.0          11.0                      . NonHDL 05/26/2014 160.80   Final   NOTE:  Non-HDL goal should be 30 mg/dL higher than patient's LDL goal (i.e. LDL goal of < 70 mg/dL, would have non-HDL goal of < 100 mg/dL)  . WBC 05/26/2014 11.3* 4.0 - 10.5 K/uL Final  . RBC 05/26/2014 4.50  3.87 - 5.11 Mil/uL Final  . Hemoglobin 05/26/2014 11.7* 12.0 - 15.0 g/dL Final  . HCT 05/26/2014 35.3* 36.0 - 46.0 % Final  . MCV 05/26/2014 78.5  78.0 - 100.0 fl Final  . MCHC 05/26/2014 33.0  30.0 - 36.0 g/dL Final  . RDW 05/26/2014 16.4* 11.5 - 15.5 % Final  . Platelets 05/26/2014 306.0  150.0 - 400.0 K/uL Final  . Neutrophils Relative % 05/26/2014 50.0  43.0 - 77.0 % Final  . Lymphocytes Relative 05/26/2014 39.0  12.0 - 46.0 % Final  . Monocytes Relative 05/26/2014 7.9  3.0 - 12.0 % Final  . Eosinophils Relative 05/26/2014 2.7  0.0 - 5.0 % Final  . Basophils Relative 05/26/2014 0.4  0.0 - 3.0 % Final  . Neutro Abs 05/26/2014 5.7  1.4 - 7.7 K/uL Final  . Lymphs Abs 05/26/2014 4.4* 0.7 - 4.0 K/uL Final  . Monocytes Absolute 05/26/2014 0.9  0.1 - 1.0 K/uL Final  . Eosinophils Absolute 05/26/2014 0.3  0.0 - 0.7 K/uL Final  . Basophils Absolute 05/26/2014 0.0  0.0 - 0.1 K/uL Final  . Color, UA 05/26/2014 yellow   Final  . Clarity, UA 05/26/2014 clear   Final  . Glucose, UA 05/26/2014 n   Final  . Bilirubin, UA 05/26/2014 n   Final  . Ketones, UA 05/26/2014 n   Final  . Spec Grav, UA 05/26/2014 1.010   Final  . Blood, UA 05/26/2014 n   Final  . pH, UA 05/26/2014 5.0   Final  . Protein, UA 05/26/2014 2+   Final  . Urobilinogen, UA 05/26/2014 0.2   Final  . Nitrite, UA 05/26/2014 n   Final  . Leukocytes, UA 05/26/2014 Negative   Final  . TSH 05/26/2014 0.52  0.35 - 4.50 uIU/mL Final    Physical Examination:  BP 170/88 mmHg  Pulse 72  Temp(Src) 98.6 F (37 C)  Resp 16  Ht 5\' 5"  (1.651 m)  Wt 237 lb (107.502 kg)  BMI 39.44 kg/m2  SpO2 97%  GENERAL:          Patient has generalized obesity.   HEENT:         Eye exam shows normal external appearance. Fundus exam shows no retinopathy. Oral exam shows normal mucosa .  NECK:         General:  Neck exam shows no lymphadenopathy. Carotids are normal to palpation and no bruit heard.  Thyroid is not enlarged and no nodules felt.   LUNGS:         Chest is symmetrical. Lungs are clear to auscultation.Marland Kitchen   HEART:         Heart sounds:  S1 and S2 are normal. No murmurs or clicks heard., no S3 or S4.   ABDOMEN:   There is no distention present. Liver and spleen are not palpable. No other mass or tenderness present.  EXTREMITIES:     There is no edema. No skin lesions present.  NEUROLOGICAL:   Vibration sense is moderately reduced in toes. Ankle jerks are absent bilaterally.          Diabetic foot exam shows normal monofilament sensation in the toes and plantar surfaces, no skin lesions or ulcers on the feet and normal pedal pulses MUSCULOSKELETAL:       There is no enlargement or deformity of the joints. Spine is normal to inspection.Marland Kitchen   SKIN:       No rash or lesions of concern.        ASSESSMENT:  Diabetes type 2, uncontrolled with BMI about 40 and A1c of 8%    Currently on a regimen of Lantus insulin and NovoLog at suppertime only As discussed in history of present illness she is not consistent with her diet and has difficulty losing weight She also is checking her blood sugar only in the morning and currently using expired test strips. Since her fasting blood sugars are not consistently high she probably has postprandial hyperglycemia fairly regularly She is fairly good with her exercise regimen  HYPERTENSION: Blood pressure is poorly controlled today and was high previously also with PCP  Complications: currently none  PLAN:   Discussed need to take NovoLog consistently before meals and may need larger doses than 12 units.  She will adjust this based on her meal size.  Meanwhile she will continue  taking 50 units of Lantus at night  Encouraged her to be consistent with diet with moderation of fat intake  Since she is concerned about the cost of Lantus and NovoLog she is a good candidate  for the V-go pump.  This was shown to her and discussed how it would work for her diabetes and help with both basal and bolus insulin requirements.  She will look into this in more detail and contact the nurse educator for weeklong trial if interested and has no difficulty with insurance coverage  She will also start using the One Touch Verio meter.  This was demonstrated to her.  Discussed timing of glucose monitoring and blood sugar targets  For her hypercholesterolemia  discussed lack of control of her lipids and she needs to take her simvastatin with her evening meal consistently  HYPERTENSION needs to be treated with additional medication and she will follow-up with PCP  Patient Instructions  Please check blood sugars at least half the time about 2 hours after any meal and 3 times per week on waking up.   Please bring blood sugar monitor to each visit.  Recommended blood sugar levels about 2 hours after meal is 140-180 and on waking up 90-130  TAKE Novolog 5-10 min BEFORE MEALS.  May take 14-15 units for larger meals or meals that have more carbohydrate or fried items at suppertime If blood sugar is over 180 after breakfast or lunch may need to take 4-5 units for other meals also  Check the coverage of V-go pump  Take simvastatin at suppertime daily   Counseling time over 50% of today's 60 minute visit  Celedonio Sortino 06/09/2014, 3:54 PM   Note: This office note was prepared with Dragon voice recognition system technology. Any transcriptional errors that result from this process are unintentional.

## 2014-06-23 ENCOUNTER — Other Ambulatory Visit: Payer: Self-pay | Admitting: Family

## 2014-06-27 ENCOUNTER — Ambulatory Visit (INDEPENDENT_AMBULATORY_CARE_PROVIDER_SITE_OTHER): Payer: Medicare Other | Admitting: Family Medicine

## 2014-06-27 ENCOUNTER — Telehealth: Payer: Self-pay | Admitting: Family Medicine

## 2014-06-27 ENCOUNTER — Ambulatory Visit (INDEPENDENT_AMBULATORY_CARE_PROVIDER_SITE_OTHER): Payer: Medicare Other

## 2014-06-27 VITALS — BP 130/81 | HR 85 | Temp 98.4°F | Resp 17 | Ht 66.0 in | Wt 238.0 lb

## 2014-06-27 DIAGNOSIS — R05 Cough: Secondary | ICD-10-CM

## 2014-06-27 DIAGNOSIS — R058 Other specified cough: Secondary | ICD-10-CM

## 2014-06-27 DIAGNOSIS — J189 Pneumonia, unspecified organism: Secondary | ICD-10-CM

## 2014-06-27 MED ORDER — DOXYCYCLINE HYCLATE 100 MG PO CAPS: 100.0000 mg | ORAL_CAPSULE | Freq: Two times a day (BID) | ORAL | Status: DC

## 2014-06-27 NOTE — Progress Notes (Signed)
[facility] [address] [place] [address] [phone]  Date:  06/27/2014   Name:  Marissa Roberts   DOB:  [birth year]   MRN:  [id number]  PCP:  Donia Ast, FNP    Chief Complaint: Shortness of Breath; URI; and Nasal Congestion   History of Present Illness:  Marissa Roberts is a 69 y.o. very pleasant female patient who presents with the following:  She is here today with illness.  She got ill about one month ago, got well but then her sx returned last week. She notes a cough productive of green mucus.  She has been taking OTC medictaions.  She has not checked her temperature.   She has noted some chills and aches. She has chest discomfot with coughing.   Quit smoking many years ago Last dose of medication was yesterday.   She did not take any abx or other rx medications last month when she was ill Patient Active Problem List   Diagnosis Date Noted  . Obesity (BMI 30-39.9) 04/19/2013  . Colon polyp, hyperplastic 08/23/2010  . INSECT BITE, LEG 10/17/2009  . HYPERLIPIDEMIA 07/28/2009  . OSTEOPENIA 03/14/2009  . MYALGIA 01/12/2009  . HYPOKALEMIA, HX OF 01/12/2009  . GASTROPARESIS 02/16/2008  . DIABETES MELLITUS, TYPE II 11/14/2006  . HYPOTHYROIDISM 09/24/2006  . HYPERTENSION 09/24/2006    Past Medical History  Diagnosis Date  . Hypertension   . Thyroid disease   . Diabetes mellitus   . CHF (congestive heart failure)   . PONV (postoperative nausea and vomiting)   . Hypothyroidism   . Pneumonia     hx of  . Bronchitis     hx of  . GERD (gastroesophageal reflux disease)   . Arthritis   . Hypercholesteremia     Past Surgical History  Procedure Laterality Date  . Knee arhtroscopy    . Carpel tunnel    . Foot surgery    . Abdominal hysterectomy      partial  . Tonsillectomy    . Breast surgery      lumpectomy left breast  . Colonoscopy w/ polypectomy    . Shoulder arthroscopy with subacromial decompression, rotator cuff  repair and bicep tendon repair Right 05/26/2012    Procedure: Right Shoulder Diagnostic Operative Arthroscopy, Subacromial Decompression, Biceps Tenodesis, Mini Open Rotator Cuff Repair;  Surgeon: [name], MD;  Location: [facility];  Service: Orthopedics;  Laterality: Right;  Right Shoulder Diagnostic Operative Arthroscopy, Subacromial Decompression, Biceps Tenodesis, Mini Open Rotator Cuff Repair    History  Substance Use Topics  . Smoking status: Former Smoker    Quit date: 04/01/1990  . Smokeless tobacco: Never Used  . Alcohol Use: No    Family History  Problem Relation Age of Onset  . Adopted: Yes  . Coronary artery disease    . Diabetes Mother   . Heart disease Mother   . Hyperlipidemia Mother     Allergies  Allergen Reactions  . Contrast Media [Iodinated Diagnostic Agents]     Throat swells from ct con tra  . Adhesive [Tape]     No paper tape, causes blisters  . Erythromycin     REACTION: rash  . Penicillins     REACTION: rash    Medication list has been reviewed and updated.  Current Outpatient Prescriptions on File Prior to Visit  Medication Sig Dispense Refill  . amLODipine-benazepril (LOTREL) 5-40 MG per capsule Take 1 capsule by mouth daily. 90 capsule  2  . Calcium Carbonate (CALCIUM 600) 1500 MG TABS Take 2 tablets by mouth daily.     . furosemide (LASIX) 40 MG tablet Take 2 tablets (80 mg total) by mouth daily. 360 tablet 1  . glucosamine-chondroitin 500-400 MG tablet Take 1 tablet by mouth daily.      Marland Kitchen glucose blood (ONETOUCH VERIO) test strip Please check blood sugars at least half the time about 2 hours after any meal and 3 times per week on waking up. Dx code E11.9 50 each 2  . insulin aspart (NOVOLOG) 100 UNIT/ML FlexPen Inject 12 Units into the skin 3 (three) times daily with meals. 45 mL 0  . Insulin Pen Needle (B-D ULTRAFINE III SHORT PEN) 31G X 8 MM MISC 1 each by Does not apply route daily. To use with lantus 100 each 3  . LANTUS SOLOSTAR 100  UNIT/ML Solostar Pen INJECT 55 UNITS INTO THE SKIN AT BEDTIME 4 pen 5  . levothyroxine (SYNTHROID, LEVOTHROID) 137 MCG tablet TAKE ONE TABLET BY MOUTH ONCE DAILY 90 tablet 1  . LINZESS 290 MCG CAPS capsule     . metFORMIN (GLUCOPHAGE) 1000 MG tablet Take 1 tablet (1,000 mg total) by mouth 2 (two) times daily with a meal. 180 tablet 1  . omeprazole (PRILOSEC) 20 MG capsule Take 40 mg by mouth daily.     Glory Rosebush DELICA LANCETS FINE MISC Please check blood sugars at least half the time about 2 hours after any meal and 3 times per week on waking up. Dx code E11.9 100 each 2  . potassium chloride (K-DUR) 10 MEQ tablet Take 1 tablet (10 mEq total) by mouth 2 (two) times daily. 180 tablet 3  . simvastatin (ZOCOR) 20 MG tablet TAKE ONE TABLET BY MOUTH AT BEDTIME 30 tablet 3  . traMADol (ULTRAM) 50 MG tablet Take 50 mg by mouth every 6 (six) hours as needed.     . [DISCONTINUED] Saxagliptin-Metformin 07-998 MG TB24 Take 1 tablet by mouth daily. 30 tablet    No current facility-administered medications on file prior to visit.    Review of Systems:  As per HPI- otherwise negative.   Physical Examination: Filed Vitals:   06/27/14 1303  BP: 130/81  Pulse: 85  Temp: 98.4 F (36.9 C)  Resp: 17   Filed Vitals:   06/27/14 1303  Height: 5\' 6"  (1.676 m)  Weight: 238 lb (107.956 kg)   Body mass index is 38.43 kg/(m^2). Ideal Body Weight: Weight in (lb) to have BMI = 25: 154.6  GEN: WDWN, NAD, Non-toxic, A & O x 3, obese, looks well HEENT: Atraumatic, Normocephalic. Neck supple. No masses, No LAD.  Bilateral TM wnl, oropharynx normal.  PEERL,EOMI.   Ears and Nose: No external deformity. CV: RRR, No M/G/R. No JVD. No thrill. No extra heart sounds. PULM: CTA B, no wheezes, crackles, rhonchi. No retractions. No resp. distress. No accessory muscle use. EXTR: No c/c/e NEURO Normal gait.  PSYCH: Normally interactive. Conversant. Not depressed or anxious appearing.  Calm demeanor.   [facility] reading  (PRIMARY) by  Dr. Lorelei Pont. CXR:  Right hilar fullness- possible small pneumonia.   CHEST 2 VIEW  COMPARISON: 09/23/2011  FINDINGS: Hypoventilation with decreased lung volume. Mild atelectasis in the bases. No definite pneumonia or effusion. Negative for heart failure.  IMPRESSION: Hypoventilation with mild bibasilar atelectasis.   Assessment and Plan: CAP (community acquired pneumonia) - Plan: doxycycline (VIBRAMYCIN) 100 MG capsule  Productive cough - Plan: DG Chest 2 View  Treat  with doxycycline as above, she will follow-up if not better soon  Signed Lamar Blinks, MD

## 2014-06-27 NOTE — Telephone Encounter (Signed)
Advised regarding her CXR results, encouraged deep breaths to help open her lungs

## 2014-06-27 NOTE — Patient Instructions (Addendum)
We are going to treat you for a mild pneumonia with doxycycline (antibiotic).  Take it twice a day for 10 days Let me know if you are not feeling better in the next few days-Sooner if worse.   g

## 2014-06-28 ENCOUNTER — Other Ambulatory Visit: Payer: Self-pay

## 2014-06-28 MED ORDER — AMLODIPINE BESY-BENAZEPRIL HCL 5-40 MG PO CAPS: 1.0000 | ORAL_CAPSULE | Freq: Every day | ORAL | Status: DC

## 2014-06-28 NOTE — Telephone Encounter (Signed)
Rx sent for #90 with 0 rf.  Pt will establish with Dr. Doug Sou in April 2016.

## 2014-06-28 NOTE — Telephone Encounter (Signed)
Rx request for amlod/benazepril 5-40 mg capsule-Take 1 capsule by mouth

## 2014-07-14 ENCOUNTER — Encounter: Payer: Self-pay | Admitting: Endocrinology

## 2014-07-14 ENCOUNTER — Ambulatory Visit (INDEPENDENT_AMBULATORY_CARE_PROVIDER_SITE_OTHER): Payer: Medicare Other | Admitting: Endocrinology

## 2014-07-14 VITALS — BP 142/72 | HR 76 | Temp 98.3°F | Ht 66.0 in | Wt 236.4 lb

## 2014-07-14 DIAGNOSIS — IMO0002 Reserved for concepts with insufficient information to code with codable children: Secondary | ICD-10-CM

## 2014-07-14 DIAGNOSIS — E1165 Type 2 diabetes mellitus with hyperglycemia: Secondary | ICD-10-CM | POA: Diagnosis not present

## 2014-07-14 MED ORDER — INSULIN ASPART 100 UNIT/ML ~~LOC~~ SOLN: SUBCUTANEOUS | Status: DC

## 2014-07-14 NOTE — Progress Notes (Signed)
Pre visit review using our clinic review tool, if applicable. No additional management support is needed unless otherwise documented below in the visit note. 

## 2014-07-14 NOTE — Patient Instructions (Addendum)
Please check blood sugars at least half the time about 2 hours after any meal and 3 times per week on waking up.   Please bring blood sugar monitor to each visit. Recommended blood sugar levels about 2 hours after meal is 140-180 and on waking up 90-130   No Lantus night before pump start

## 2014-07-14 NOTE — Progress Notes (Signed)
Patient ID: Marissa Roberts, female   DOB: 30-Oct-1945, 69 y.o.   MRN: [id number]           Reason for Appointment:  Follow-up for Type 2 Diabetes  Referring physician: Megan Salon  History of Present Illness:          Diagnosis: Type 2 diabetes mellitus, date of diagnosis: 2005       Past history:  She thinks her blood sugars were only mildly increased at borderline levels at the onsetper Most likely she was treated with metformin initially and this has been continued.   At some point she was also tried on [place] and [place] for improving her control. She is not sure why she was started on insulin 5 years ago, presumably for worsening hyperglycemia Also not clear if she has tried various insulin regimens before starting Lantus and NovoLog Her A1c has been usually over 7% and gradually increasing over the last year  Recent history:  Had poor control of her diabetes with taking Lantus insulin and only pre-meal coverage of her supper meal with NovoLog   On her initial consultation she was advised to start taking NovoLog with every meal because of post pending hyperglycemia and possibly increase her dose at suppertime to control her postprandial readings   She was started on a One Touch glucose monitor and she has used this.  She was told to take readings at least half the time after meals but She has done readings in the mornings only and only twice after meals.  The readings after meals are over 200  Also her blood sugars in the mornings are fluctuating; occasionally may forget to take her Lantus at night also   She was asked to look into the V-go pump at her last visit and although she thinks it will be covered by her insurance she still has questions about it today and this was discussed again       Oral hypoglycemic drugs the patient is taking are: Metformin 2 g daily    Side effects from medications have been:none INSULIN regimen is described as: Lantus 50 hs, Novolog 12 ac or  pcs Compliance with the medical regimen: poor Hypoglycemia: rare if late for meal   Glucose monitoring:  done one time a day         Glucometer:  One Touch.      Blood Glucose readings by time of day recently  PREMEAL Breakfast Lunch Dinner Bedtime  Overall   Glucose range:  81-175   240  207   Median:       124   Self-care: The diet that the patient has been following is: tries to limit sweets , generally has avoided drinks with sugar.     Meals: 3 meals per day. Breakfast is toast, bacon, egg; usually not a lot of carbohydrates, some fried food including fish.    Not always restricting fat intake like gravy and meat          Exercise: 4-5/7days a week for about an hour at the gym in the mornings         Dietician visit, most recent:2014.               Weight history:  Wt Readings from Last 3 Encounters:  07/14/14 236 lb 6 oz (107.219 kg)  06/27/14 238 lb (107.956 kg)  06/09/14 237 lb (107.502 kg)    Glycemic control:   Lab Results  Component Value Date   HGBA1C  8.0* 05/26/2014   HGBA1C 7.7* 01/07/2014   HGBA1C 7.5* 08/25/2013   Lab Results  Component Value Date   MICROALBUR 21.5* 02/28/2014   LDLCALC 136* 05/26/2014   CREATININE 0.77 05/26/2014         Medication List       This list is accurate as of: 07/14/14  3:52 PM.  Always use your most recent med list.               amLODipine-benazepril 5-40 MG per capsule  Commonly known as:  LOTREL  Take 1 capsule by mouth daily.     CALCIUM 600 1500 (600 CA) MG Tabs  Generic drug:  Calcium Carbonate  Take 2 tablets by mouth daily.     cyanocobalamin 100 MCG tablet  Take 100 mcg by mouth daily.     doxycycline 100 MG capsule  Commonly known as:  VIBRAMYCIN  Take 1 capsule (100 mg total) by mouth 2 (two) times daily.     EQ FIBER SUPPLEMENT PO  Take by mouth.     furosemide 40 MG tablet  Commonly known as:  LASIX  Take 2 tablets (80 mg total) by mouth daily.     glucosamine-chondroitin 500-400 MG  tablet  Take 1 tablet by mouth daily.     glucose blood test strip  Commonly known as:  ONETOUCH VERIO  Please check blood sugars at least half the time about 2 hours after any meal and 3 times per week on waking up. Dx code E11.9     insulin aspart 100 UNIT/ML injection  Commonly known as:  NOVOLOG  Use upto 75 units daily in V-Go pump     Insulin Pen Needle 31G X 8 MM Misc  Commonly known as:  B-D ULTRAFINE III SHORT PEN  1 each by Does not apply route daily. To use with lantus     LANTUS SOLOSTAR 100 UNIT/ML Solostar Pen  Generic drug:  Insulin Glargine  INJECT 55 UNITS INTO THE SKIN AT BEDTIME     levothyroxine 137 MCG tablet  Commonly known as:  SYNTHROID, LEVOTHROID  TAKE ONE TABLET BY MOUTH ONCE DAILY     LINZESS 290 MCG Caps capsule  Generic drug:  Linaclotide     metFORMIN 1000 MG tablet  Commonly known as:  GLUCOPHAGE  Take 1 tablet (1,000 mg total) by mouth 2 (two) times daily with a meal.     omeprazole 20 MG capsule  Commonly known as:  PRILOSEC  Take 40 mg by mouth daily.     ONETOUCH DELICA LANCETS FINE Misc  Please check blood sugars at least half the time about 2 hours after any meal and 3 times per week on waking up. Dx code E11.9     potassium chloride 10 MEQ tablet  Commonly known as:  K-DUR  Take 1 tablet (10 mEq total) by mouth 2 (two) times daily.     PROBIOTIC DAILY PO  Take by mouth.     simvastatin 20 MG tablet  Commonly known as:  ZOCOR  TAKE ONE TABLET BY MOUTH AT BEDTIME     traMADol 50 MG tablet  Commonly known as:  ULTRAM  Take 50 mg by mouth every 6 (six) hours as needed.        Allergies:  Allergies  Allergen Reactions  . Contrast Media [Iodinated Diagnostic Agents]     Throat swells from ct con tra  . Adhesive [Tape]     No paper tape, causes blisters  .  Erythromycin     REACTION: rash  . Penicillins     REACTION: rash    Past Medical History  Diagnosis Date  . Hypertension   . Thyroid disease   . Diabetes  mellitus   . CHF (congestive heart failure)   . PONV (postoperative nausea and vomiting)   . Hypothyroidism   . Pneumonia     hx of  . Bronchitis     hx of  . GERD (gastroesophageal reflux disease)   . Arthritis   . Hypercholesteremia     Past Surgical History  Procedure Laterality Date  . Knee arhtroscopy    . Carpel tunnel    . Foot surgery    . Abdominal hysterectomy      partial  . Tonsillectomy    . Breast surgery      lumpectomy left breast  . Colonoscopy w/ polypectomy    . Shoulder arthroscopy with subacromial decompression, rotator cuff repair and bicep tendon repair Right 05/26/2012    Procedure: Right Shoulder Diagnostic Operative Arthroscopy, Subacromial Decompression, Biceps Tenodesis, Mini Open Rotator Cuff Repair;  Surgeon: [name], MD;  Location: [facility];  Service: Orthopedics;  Laterality: Right;  Right Shoulder Diagnostic Operative Arthroscopy, Subacromial Decompression, Biceps Tenodesis, Mini Open Rotator Cuff Repair    Family History  Problem Relation Age of Onset  . Adopted: Yes  . Coronary artery disease    . Diabetes Mother   . Heart disease Mother   . Hyperlipidemia Mother     Social History:  reports that she quit smoking about 24 years ago. She has never used smokeless tobacco. She reports that she does not drink alcohol or use illicit drugs.    Review of Systems       Vision is normal. Most recent eye exam was 6/15       Lipids: she thinks she is taking simvastatin but not always remembering it, she thinks it is difficult to remember it at bedtime.  Lipids are poorly controlled       Lab Results  Component Value Date   CHOL 217* 05/26/2014   HDL 56.20 05/26/2014   LDLCALC 136* 05/26/2014   LDLDIRECT 129.5 02/06/2012   TRIG 124.0 05/26/2014   CHOLHDL 4 05/26/2014                    Thyroid:  No  unusual fatigue.  She thinks she has been hypothyroid for about 40 years and is regular with her supplement of 137 g  Levothroid  Lab Results  Component Value Date   TSH 0.52 05/26/2014   TSH 0.40 01/07/2014   TSH 0.37 06/09/2013   FREET4 1.03 06/28/2010   FREET4 1.36 11/27/2007        The blood pressure has been difficult to control .  Currently taking Lotrel only, Does not have a follow-up with PCP this month     LABS:  No visits with results within 1 Week(s) from this visit. Latest known visit with results is:  Office Visit on 05/26/2014  Component Date Value Ref Range Status  . Hgb A1c MFr Bld 05/26/2014 8.0* 4.6 - 6.5 % Final   Glycemic Control Guidelines for People with Diabetes:Non Diabetic:  <6%Goal of Therapy: <7%Additional Action Suggested:  >8%   . Sodium 05/26/2014 139  135 - 145 mEq/L Final  . Potassium 05/26/2014 4.1  3.5 - 5.1 mEq/L Final  . Chloride 05/26/2014 103  96 - 112 mEq/L Final  . CO2 05/26/2014 30  19 - 32 mEq/L Final  . Glucose, Bld 05/26/2014 187* 70 - 99 mg/dL Final  . BUN 05/26/2014 15  6 - 23 mg/dL Final  . Creatinine, Ser 05/26/2014 0.77  0.40 - 1.20 mg/dL Final  . Calcium 05/26/2014 9.8  8.4 - 10.5 mg/dL Final  . GFR 05/26/2014 96.23  >60.00 mL/min Final  . Total Bilirubin 05/26/2014 0.3  0.2 - 1.2 mg/dL Final  . Bilirubin, Direct 05/26/2014 0.0  0.0 - 0.3 mg/dL Final  . Alkaline Phosphatase 05/26/2014 65  39 - 117 U/L Final  . AST 05/26/2014 21  0 - 37 U/L Final  . ALT 05/26/2014 24  0 - 35 U/L Final  . Total Protein 05/26/2014 7.0  6.0 - 8.3 g/dL Final  . Albumin 05/26/2014 4.0  3.5 - 5.2 g/dL Final  . Cholesterol 05/26/2014 217* 0 - 200 mg/dL Final   ATP III Classification       Desirable:  < 200 mg/dL               Borderline High:  200 - 239 mg/dL          High:  > = 240 mg/dL  . Triglycerides 05/26/2014 124.0  0.0 - 149.0 mg/dL Final   Normal:  <150 mg/dLBorderline High:  150 - 199 mg/dL  . HDL 05/26/2014 56.20  >39.00 mg/dL Final  . VLDL 05/26/2014 24.8  0.0 - 40.0 mg/dL Final  . LDL Cholesterol 05/26/2014 136* 0 - 99 mg/dL Final  . Total  CHOL/HDL Ratio 05/26/2014 4   Final                  Men          Women1/2 Average Risk     3.4          3.3Average Risk          5.0          4.42X Average Risk          9.6          7.13X Average Risk          15.0          11.0                      . NonHDL 05/26/2014 160.80   Final   NOTE:  Non-HDL goal should be 30 mg/dL higher than patient's LDL goal (i.e. LDL goal of < 70 mg/dL, would have non-HDL goal of < 100 mg/dL)  . WBC 05/26/2014 11.3* 4.0 - 10.5 K/uL Final  . RBC 05/26/2014 4.50  3.87 - 5.11 Mil/uL Final  . Hemoglobin 05/26/2014 11.7* 12.0 - 15.0 g/dL Final  . HCT 05/26/2014 35.3* 36.0 - 46.0 % Final  . MCV 05/26/2014 78.5  78.0 - 100.0 fl Final  . MCHC 05/26/2014 33.0  30.0 - 36.0 g/dL Final  . RDW 05/26/2014 16.4* 11.5 - 15.5 % Final  . Platelets 05/26/2014 306.0  150.0 - 400.0 K/uL Final  . Neutrophils Relative % 05/26/2014 50.0  43.0 - 77.0 % Final  . Lymphocytes Relative 05/26/2014 39.0  12.0 - 46.0 % Final  . Monocytes Relative 05/26/2014 7.9  3.0 - 12.0 % Final  . Eosinophils Relative 05/26/2014 2.7  0.0 - 5.0 % Final  . Basophils Relative 05/26/2014 0.4  0.0 - 3.0 % Final  . Neutro Abs 05/26/2014 5.7  1.4 - 7.7 K/uL Final  . Lymphs Abs 05/26/2014 4.4* 0.7 -  4.0 K/uL Final  . Monocytes Absolute 05/26/2014 0.9  0.1 - 1.0 K/uL Final  . Eosinophils Absolute 05/26/2014 0.3  0.0 - 0.7 K/uL Final  . Basophils Absolute 05/26/2014 0.0  0.0 - 0.1 K/uL Final  . Color, UA 05/26/2014 yellow   Final  . Clarity, UA 05/26/2014 clear   Final  . Glucose, UA 05/26/2014 n   Final  . Bilirubin, UA 05/26/2014 n   Final  . Ketones, UA 05/26/2014 n   Final  . Spec Grav, UA 05/26/2014 1.010   Final  . Blood, UA 05/26/2014 n   Final  . pH, UA 05/26/2014 5.0   Final  . Protein, UA 05/26/2014 2+   Final  . Urobilinogen, UA 05/26/2014 0.2   Final  . Nitrite, UA 05/26/2014 n   Final  . Leukocytes, UA 05/26/2014 Negative   Final  . TSH 05/26/2014 0.52  0.35 - 4.50 uIU/mL Final    Physical  Examination:  BP 142/72 mmHg  Pulse 76  Temp(Src) 98.3 F (36.8 C) (Oral)  Ht 5\' 6"  (1.676 m)  Wt 236 lb 6 oz (107.219 kg)  BMI 38.17 kg/m2  SpO2 94%        ASSESSMENT:  Diabetes type 2, uncontrolled with BMI about 40 and A1c of 8%     She is on a regimen of Lantus insulin and NovoLog irregularly As discussed in history of present illness she has not changed her regimen of insulin as directed and also not monitoring readings after meals  She needs significant amount of diabetes education also   Have emphasized the need for taking mealtime coverage with every meal   She is fairly good with her exercise regimen  HYPERTENSION: Blood pressure is  Better controlled today and was high previously with PCP   PLAN:     She will be given a trial of the V-go pump with the 40 unit basal along with  4-6 units with every meal 4 boluses   However in the meantime she needs to try and start taking NovoLog with every meal  Meanwhile she will continue taking 50 units of Lantus at night   She will also need general diabetes education and information on day-to-day management from nurse educator    Patient Instructions  Please check blood sugars at least half the time about 2 hours after any meal and 3 times per week on waking up.   Please bring blood sugar monitor to each visit. Recommended blood sugar levels about 2 hours after meal is 140-180 and on waking up 90-130   No Lantus night before pump start    [place] 07/14/2014, 3:52 PM   Note: This office note was prepared with Estate agent. Any transcriptional errors that result from this process are unintentional.

## 2014-07-15 ENCOUNTER — Ambulatory Visit (INDEPENDENT_AMBULATORY_CARE_PROVIDER_SITE_OTHER): Payer: Medicare Other | Admitting: Internal Medicine

## 2014-07-15 ENCOUNTER — Encounter: Payer: Self-pay | Admitting: Internal Medicine

## 2014-07-15 VITALS — BP 138/72 | HR 85 | Temp 98.0°F | Resp 16 | Ht 65.0 in | Wt 232.0 lb

## 2014-07-15 DIAGNOSIS — Z23 Encounter for immunization: Secondary | ICD-10-CM

## 2014-07-15 DIAGNOSIS — I10 Essential (primary) hypertension: Secondary | ICD-10-CM

## 2014-07-15 DIAGNOSIS — E039 Hypothyroidism, unspecified: Secondary | ICD-10-CM

## 2014-07-15 DIAGNOSIS — E1165 Type 2 diabetes mellitus with hyperglycemia: Secondary | ICD-10-CM

## 2014-07-15 DIAGNOSIS — E118 Type 2 diabetes mellitus with unspecified complications: Secondary | ICD-10-CM

## 2014-07-15 DIAGNOSIS — E785 Hyperlipidemia, unspecified: Secondary | ICD-10-CM

## 2014-07-15 DIAGNOSIS — IMO0002 Reserved for concepts with insufficient information to code with codable children: Secondary | ICD-10-CM

## 2014-07-15 MED ORDER — FLUTICASONE PROPIONATE 50 MCG/ACT NA SUSP: 2.0000 | Freq: Every day | NASAL | Status: DC

## 2014-07-15 NOTE — Progress Notes (Signed)
Pre visit review using our clinic review tool, if applicable. No additional management support is needed unless otherwise documented below in the visit note. 

## 2014-07-15 NOTE — Patient Instructions (Signed)
We have sent in a medicine called flonase which is a nose spray that helps the drainage to dry up. Use 2 sprays in each nostril once a day. It may take 3-5 days for it to kick in. The cough may take a couple weeks to go away but your lungs sound great today and are not having any problems.   Keep the appointment with Dr. Dwyane Dee and be sure to ask his nurse all your questions about the pump and supplies.   Keep working on staying active and weight loss. Every 1 pound you lose takes 4 pounds of pressure off your knees and can really help out. Weight loss also helps with your blood pressure and your sugars.   We have given you the first pneumonia shot today and will give you the next one in 1 year.   Diabetes Mellitus and Food It is important for you to manage your blood sugar (glucose) level. Your blood glucose level can be greatly affected by what you eat. Eating healthier foods in the appropriate amounts throughout the day at about the same time each day will help you control your blood glucose level. It can also help slow or prevent worsening of your diabetes mellitus. Healthy eating may even help you improve the level of your blood pressure and reach or maintain a healthy weight.  HOW CAN FOOD AFFECT ME? Carbohydrates Carbohydrates affect your blood glucose level more than any other type of food. Your dietitian will help you determine how many carbohydrates to eat at each meal and teach you how to count carbohydrates. Counting carbohydrates is important to keep your blood glucose at a healthy level, especially if you are using insulin or taking certain medicines for diabetes mellitus. Alcohol Alcohol can cause sudden decreases in blood glucose (hypoglycemia), especially if you use insulin or take certain medicines for diabetes mellitus. Hypoglycemia can be a life-threatening condition. Symptoms of hypoglycemia (sleepiness, dizziness, and disorientation) are similar to symptoms of having too much  alcohol.  If your health care provider has given you approval to drink alcohol, do so in moderation and use the following guidelines:  Women should not have more than one drink per day, and men should not have more than two drinks per day. One drink is equal to:  12 oz of beer.  5 oz of wine.  1 oz of hard liquor.  Do not drink on an empty stomach.  Keep yourself hydrated. Have water, diet soda, or unsweetened iced tea.  Regular soda, juice, and other mixers might contain a lot of carbohydrates and should be counted. WHAT FOODS ARE NOT RECOMMENDED? As you make food choices, it is important to remember that all foods are not the same. Some foods have fewer nutrients per serving than other foods, even though they might have the same number of calories or carbohydrates. It is difficult to get your body what it needs when you eat foods with fewer nutrients. Examples of foods that you should avoid that are high in calories and carbohydrates but low in nutrients include:  Trans fats (most processed foods list trans fats on the Nutrition Facts label).  Regular soda.  Juice.  Candy.  Sweets, such as cake, pie, doughnuts, and cookies.  Fried foods. WHAT FOODS CAN I EAT? Have nutrient-rich foods, which will nourish your body and keep you healthy. The food you should eat also will depend on several factors, including:  The calories you need.  The medicines you take.  Your weight.  Your blood glucose level.  Your blood pressure level.  Your cholesterol level. You also should eat a variety of foods, including:  Protein, such as meat, poultry, fish, tofu, nuts, and seeds (lean animal proteins are best).  Fruits.  Vegetables.  Dairy products, such as milk, cheese, and yogurt (low fat is best).  Breads, grains, pasta, cereal, rice, and beans.  Fats such as olive oil, trans fat-free margarine, canola oil, avocado, and olives. DOES EVERYONE WITH DIABETES MELLITUS HAVE THE SAME  MEAL PLAN? Because every person with diabetes mellitus is different, there is not one meal plan that works for everyone. It is very important that you meet with a dietitian who will help you create a meal plan that is just right for you. Document Released: 12/13/2004 Document Revised: 03/23/2013 Document Reviewed: 02/12/2013 [facility] Patient Information 2015 [place], [place]. This information is not intended to replace advice given to you by your health care provider. Make sure you discuss any questions you have with your health care provider.

## 2014-07-17 NOTE — Progress Notes (Signed)
   Subjective:    Patient ID: Marissa Roberts, female    DOB: May 08, 1945, 69 y.o.   MRN: [id number]  HPI The patient is a 69 YO female who is coming in for follow up of chronic medical problems (see A/P for full treatment and status). She is also having a new complaint of cough. She finished a course of doxycycline and did feel better afterwards but is still coughing some. This was several weeks ago and she is concerned. Still having some drainage from her nose. No fevers, chills, SOB, sputum. Denies sinus pressure or headache. She does have hypertension (Stable on amlodipine/benazepril, lasix, no complications), diabetes (follows with endocrinology, insulin dependent, not well controlled, no known complications in chart), hypothyroidism (stable on synthroid, no dose adjustment recently, no complications or side effects).   Review of Systems  Constitutional: Negative for fever, activity change, appetite change, fatigue and unexpected weight change.  HENT: Positive for postnasal drip. Negative for congestion, ear discharge, ear pain, sinus pressure and trouble swallowing.   Respiratory: Positive for cough. Negative for chest tightness, shortness of breath and wheezing.   Cardiovascular: Negative.   Gastrointestinal: Negative.   Endocrine: Negative.   Musculoskeletal: Positive for arthralgias.  Skin: Negative.   Neurological: Negative.   Psychiatric/Behavioral: Negative.       Objective:   Physical Exam  Constitutional: She is oriented to person, place, and time. She appears well-developed and well-nourished.  Overweight  HENT:  Head: Normocephalic and atraumatic.  Mouth/Throat: Oropharynx is clear and moist.  Nasal turbinates with erythema and swollen  Eyes: EOM are normal.  Neck: Normal range of motion.  Cardiovascular: Normal rate.   Pulmonary/Chest: Effort normal. No respiratory distress. She has no wheezes. She has no rales.  Abdominal: Soft.  Musculoskeletal: She exhibits no  edema.  Neurological: She is alert and oriented to person, place, and time. Coordination normal.  Skin: Skin is warm and dry.   Filed Vitals:   07/15/14 1404  BP: 138/72  Pulse: 85  Temp: 98 F (36.7 C)  TempSrc: Oral  Resp: 16  Height: 5\' 5"  (1.651 m)  Weight: 232 lb (105.235 kg)  SpO2: 97%      Assessment & Plan:  Pneumonia 23 given at visit.

## 2014-07-17 NOTE — Assessment & Plan Note (Signed)
Reviewed recent blood work from February and no indication for change in dosage of her synthroid. Will continue and recheck levels in about 4-6 months.

## 2014-07-17 NOTE — Assessment & Plan Note (Signed)
BP at goal with amlodipine/benazepril and lasix. Unclear if the lasix was started for fluid or BP and if BP increases would change lasix to hctz for more effective BP management. Reviewed recent labs and no indication for change today.

## 2014-07-17 NOTE — Assessment & Plan Note (Signed)
Complicated by gastroparesis, insulin dependent and takes lantus, novolog, and metformin. Not due for HgA1c and she will continue to follow with endocrinology. She does need to lose some weight and talked to her about exercise. Given pneumonia 23 injection today to start her pneumonia series. She is on ACE-I.

## 2014-07-17 NOTE — Assessment & Plan Note (Signed)
Her goal LDL is <100 and her last was 137 which is above goal. Talked to her about adjusting therapy and she wants to try working with diet and exercise. Will recheck at next visit and if still above goal needs change to her regimen. Currently continuing simvastatin 20 mg daily.

## 2014-07-19 ENCOUNTER — Encounter: Payer: Medicare Other | Attending: Endocrinology | Admitting: Nutrition

## 2014-07-20 NOTE — Patient Instructions (Signed)
Call [employer] customer care to see how much this will cost. Call me back to reschedule a time to start this, if you want this.

## 2014-07-20 NOTE — Progress Notes (Signed)
The patient is here with her husband.  She was shown how to fill, wear and use the V-go.  She did not want to start this today, because she wants to see if her insurance will cover this.  She was given the orange card, and told to call them with her insurance information.  When they call her back with that information, she will call me and let me know if she can afford it.

## 2014-07-21 ENCOUNTER — Ambulatory Visit (INDEPENDENT_AMBULATORY_CARE_PROVIDER_SITE_OTHER): Payer: Medicare Other | Admitting: Endocrinology

## 2014-07-21 ENCOUNTER — Encounter: Payer: Self-pay | Admitting: Endocrinology

## 2014-07-21 VITALS — BP 165/92 | HR 76 | Temp 97.8°F | Resp 16 | Ht 65.0 in | Wt 234.8 lb

## 2014-07-21 DIAGNOSIS — IMO0002 Reserved for concepts with insufficient information to code with codable children: Secondary | ICD-10-CM

## 2014-07-21 DIAGNOSIS — E118 Type 2 diabetes mellitus with unspecified complications: Secondary | ICD-10-CM

## 2014-07-21 DIAGNOSIS — E1165 Type 2 diabetes mellitus with hyperglycemia: Secondary | ICD-10-CM

## 2014-07-21 NOTE — Patient Instructions (Signed)
Please check blood sugars at least half the time about 2 hours after any meal and 3 times per week on waking up.   Please bring blood sugar monitor to each visit.  Recommended blood sugar levels about 2 hours after meal is 140-180 and on waking up 90-130  May take Lantus right at supper for convenience  Must take Novolog with you when eating out  Adjust Novolog between 10-14 units based on amount of Carbs and meal size

## 2014-07-21 NOTE — Progress Notes (Signed)
Patient ID: Marissa Roberts, female   DOB: [birth year], 69 y.o.   MRN: [id number]           Reason for Appointment:  Follow-up for Type 2 Diabetes  Referring physician: Megan Salon  History of Present Illness:          Diagnosis: Type 2 diabetes mellitus, date of diagnosis: 2005       Past history:  She thinks her blood sugars were only mildly increased at borderline levels at the onsetper Most likely she was treated with metformin initially and this has been continued.   At some point she was also tried on [place] and [place] for improving her control. She is not sure why she was started on insulin 5 years ago, presumably for worsening hyperglycemia Also not clear if she has tried various insulin regimens before starting Lantus and NovoLog Her A1c has been usually over 7% and gradually increasing over the last year  Recent history:  She has had poor control of her diabetes with taking Lantus insulin and NovoLog with last A1c 8%  On her initial consultation she was advised to start taking NovoLog with every meal but she is not doing this consistently even after repeated instructions She also does not understand the need to take her NovoLog FlexPen with her when eating out She was told to take readings at least half the time after meals but again has done readings in the mornings only and only rarely after eating; she thinks that she does not have a two-hour up timeframe she will have a snack in between meals or after meals  The readings after meals are relatively better however She has tried to be a little more compliant with her Lantus and has not gotten it only a couple of times She also has not modified her diet enough and is still eating some fried food. She thinks she has been to the dietitian and does not want to go back   She was asked to look into the V-go pump and this was described to her by nurse educator this week; she is still waiting for information on out-of-pocket expense  before starting it  Oral hypoglycemic drugs the patient is taking are: Metformin 2 g daily    Side effects from medications have been:none INSULIN regimen is described as: Lantus 50 hs, Novolog 12 ac or pcs Compliance with the medical regimen: poor Hypoglycemia: rare if late for meal   Glucose monitoring:  done one time a day         Glucometer:  One Touch.      Blood Glucose readings by time of day recently  PRE-MEAL Breakfast Lunch Dinner Bedtime Overall  Glucose range:  81-175       Mean/median:  130/122      134/124    POST-MEAL PC Breakfast PC Lunch PC Dinner  Glucose range:   240   63-190   Mean/median:       Self-care: The diet that the patient has been following is: tries to limit sweets , generally has avoided drinks with sugar.     Meals: 3 meals per day. Breakfast is toast, bacon, egg; usually not a lot of carbohydrates, some fried food including fish.    Not always restricting fat intake like gravy and meat          Exercise: 4-5/7days a week for about an hour at the gym in the mornings         Dietician visit,  most recent:2014.               Weight history:  Wt Readings from Last 3 Encounters:  07/21/14 234 lb 12.8 oz (106.505 kg)  07/15/14 232 lb (105.235 kg)  07/14/14 236 lb 6 oz (107.219 kg)    Glycemic control:   Lab Results  Component Value Date   HGBA1C 8.0* 05/26/2014   HGBA1C 7.7* 01/07/2014   HGBA1C 7.5* 08/25/2013   Lab Results  Component Value Date   MICROALBUR 21.5* 02/28/2014   LDLCALC 136* 05/26/2014   CREATININE 0.77 05/26/2014         Medication List       This list is accurate as of: 07/21/14 10:14 AM.  Always use your most recent med list.               amLODipine-benazepril 5-40 MG per capsule  Commonly known as:  LOTREL  Take 1 capsule by mouth daily.     CALCIUM 600 1500 (600 CA) MG Tabs  Generic drug:  Calcium Carbonate  Take 2 tablets by mouth daily.     cyanocobalamin 100 MCG tablet  Take 100 mcg by mouth  daily.     EQ FIBER SUPPLEMENT PO  Take by mouth.     fluticasone 50 MCG/ACT nasal spray  Commonly known as:  FLONASE  Place 2 sprays into both nostrils daily.     furosemide 40 MG tablet  Commonly known as:  LASIX  Take 2 tablets (80 mg total) by mouth daily.     glucosamine-chondroitin 500-400 MG tablet  Take 1 tablet by mouth daily.     glucose blood test strip  Commonly known as:  ONETOUCH VERIO  Please check blood sugars at least half the time about 2 hours after any meal and 3 times per week on waking up. Dx code E11.9     insulin aspart 100 UNIT/ML FlexPen  Commonly known as:  NOVOLOG  Inject 12 Units into the skin 3 (three) times daily with meals.     insulin aspart 100 UNIT/ML injection  Commonly known as:  NOVOLOG  Use upto 75 units daily in V-Go pump     Insulin Pen Needle 31G X 8 MM Misc  Commonly known as:  B-D ULTRAFINE III SHORT PEN  1 each by Does not apply route daily. To use with lantus     LANTUS SOLOSTAR 100 UNIT/ML Solostar Pen  Generic drug:  Insulin Glargine  INJECT 55 UNITS INTO THE SKIN AT BEDTIME     levothyroxine 137 MCG tablet  Commonly known as:  SYNTHROID, LEVOTHROID  TAKE ONE TABLET BY MOUTH ONCE DAILY     LINZESS 290 MCG Caps capsule  Generic drug:  Linaclotide     metFORMIN 1000 MG tablet  Commonly known as:  GLUCOPHAGE  Take 1 tablet (1,000 mg total) by mouth 2 (two) times daily with a meal.     omeprazole 20 MG capsule  Commonly known as:  PRILOSEC  Take 40 mg by mouth daily.     ONETOUCH DELICA LANCETS FINE Misc  Please check blood sugars at least half the time about 2 hours after any meal and 3 times per week on waking up. Dx code E11.9     potassium chloride 10 MEQ tablet  Commonly known as:  K-DUR  Take 1 tablet (10 mEq total) by mouth 2 (two) times daily.     PROBIOTIC DAILY PO  Take by mouth.     simvastatin 20  MG tablet  Commonly known as:  ZOCOR  TAKE ONE TABLET BY MOUTH AT BEDTIME     traMADol 50 MG tablet    Commonly known as:  ULTRAM  Take 50 mg by mouth every 6 (six) hours as needed.        Allergies:  Allergies  Allergen Reactions  . Contrast Media [Iodinated Diagnostic Agents]     Throat swells from ct con tra  . Adhesive [Tape]     No paper tape, causes blisters  . Erythromycin     REACTION: rash  . Penicillins     REACTION: rash    Past Medical History  Diagnosis Date  . Hypertension   . Thyroid disease   . Diabetes mellitus   . CHF (congestive heart failure)   . PONV (postoperative nausea and vomiting)   . Hypothyroidism   . Pneumonia     hx of  . Bronchitis     hx of  . GERD (gastroesophageal reflux disease)   . Arthritis   . Hypercholesteremia     Past Surgical History  Procedure Laterality Date  . Knee arhtroscopy    . Carpel tunnel    . Foot surgery    . Abdominal hysterectomy      partial  . Tonsillectomy    . Breast surgery      lumpectomy left breast  . Colonoscopy w/ polypectomy    . Shoulder arthroscopy with subacromial decompression, rotator cuff repair and bicep tendon repair Right 05/26/2012    Procedure: Right Shoulder Diagnostic Operative Arthroscopy, Subacromial Decompression, Biceps Tenodesis, Mini Open Rotator Cuff Repair;  Surgeon: [name], MD;  Location: [facility];  Service: Orthopedics;  Laterality: Right;  Right Shoulder Diagnostic Operative Arthroscopy, Subacromial Decompression, Biceps Tenodesis, Mini Open Rotator Cuff Repair    Family History  Problem Relation Age of Onset  . Adopted: Yes  . Coronary artery disease    . Diabetes Mother   . Heart disease Mother   . Hyperlipidemia Mother     Social History:  reports that she quit smoking about 24 years ago. She has never used smokeless tobacco. She reports that she does not drink alcohol or use illicit drugs.    Review of Systems       Vision is normal. Most recent eye exam was 6/15       Lipids: she thinks she is taking simvastatin but not always remembering it, she  thinks it is difficult to remember it at bedtime.  Lipids are poorly controlled       Lab Results  Component Value Date   CHOL 217* 05/26/2014   HDL 56.20 05/26/2014   LDLCALC 136* 05/26/2014   LDLDIRECT 129.5 02/06/2012   TRIG 124.0 05/26/2014   CHOLHDL 4 05/26/2014                    Thyroid:  No  unusual fatigue.  She thinks she has been hypothyroid for about 40 years and is regular with her supplement of 137 g Levothroid  Lab Results  Component Value Date   TSH 0.52 05/26/2014   TSH 0.40 01/07/2014   TSH 0.37 06/09/2013   FREET4 1.03 06/28/2010   FREET4 1.36 11/27/2007        The blood pressure has been difficult to control .  Currently taking Lotrel only, Does not have a follow-up with PCP this month     LABS:  No visits with results within 1 Week(s) from this visit. Latest  known visit with results is:  Office Visit on 05/26/2014  Component Date Value Ref Range Status  . Hgb A1c MFr Bld 05/26/2014 8.0* 4.6 - 6.5 % Final   Glycemic Control Guidelines for People with Diabetes:Non Diabetic:  <6%Goal of Therapy: <7%Additional Action Suggested:  >8%   . Sodium 05/26/2014 139  135 - 145 mEq/L Final  . Potassium 05/26/2014 4.1  3.5 - 5.1 mEq/L Final  . Chloride 05/26/2014 103  96 - 112 mEq/L Final  . CO2 05/26/2014 30  19 - 32 mEq/L Final  . Glucose, Bld 05/26/2014 187* 70 - 99 mg/dL Final  . BUN 05/26/2014 15  6 - 23 mg/dL Final  . Creatinine, Ser 05/26/2014 0.77  0.40 - 1.20 mg/dL Final  . Calcium 05/26/2014 9.8  8.4 - 10.5 mg/dL Final  . GFR 05/26/2014 96.23  >60.00 mL/min Final  . Total Bilirubin 05/26/2014 0.3  0.2 - 1.2 mg/dL Final  . Bilirubin, Direct 05/26/2014 0.0  0.0 - 0.3 mg/dL Final  . Alkaline Phosphatase 05/26/2014 65  39 - 117 U/L Final  . AST 05/26/2014 21  0 - 37 U/L Final  . ALT 05/26/2014 24  0 - 35 U/L Final  . Total Protein 05/26/2014 7.0  6.0 - 8.3 g/dL Final  . Albumin 05/26/2014 4.0  3.5 - 5.2 g/dL Final  . Cholesterol 05/26/2014 217* 0  - 200 mg/dL Final   ATP III Classification       Desirable:  < 200 mg/dL               Borderline High:  200 - 239 mg/dL          High:  > = 240 mg/dL  . Triglycerides 05/26/2014 124.0  0.0 - 149.0 mg/dL Final   Normal:  <150 mg/dLBorderline High:  150 - 199 mg/dL  . HDL 05/26/2014 56.20  >39.00 mg/dL Final  . VLDL 05/26/2014 24.8  0.0 - 40.0 mg/dL Final  . LDL Cholesterol 05/26/2014 136* 0 - 99 mg/dL Final  . Total CHOL/HDL Ratio 05/26/2014 4   Final                  Men          Women1/2 Average Risk     3.4          3.3Average Risk          5.0          4.42X Average Risk          9.6          7.13X Average Risk          15.0          11.0                      . NonHDL 05/26/2014 160.80   Final   NOTE:  Non-HDL goal should be 30 mg/dL higher than patient's LDL goal (i.e. LDL goal of < 70 mg/dL, would have non-HDL goal of < 100 mg/dL)  . WBC 05/26/2014 11.3* 4.0 - 10.5 K/uL Final  . RBC 05/26/2014 4.50  3.87 - 5.11 Mil/uL Final  . Hemoglobin 05/26/2014 11.7* 12.0 - 15.0 g/dL Final  . HCT 05/26/2014 35.3* 36.0 - 46.0 % Final  . MCV 05/26/2014 78.5  78.0 - 100.0 fl Final  . MCHC 05/26/2014 33.0  30.0 - 36.0 g/dL Final  . RDW 05/26/2014 16.4* 11.5 - 15.5 % Final  . Platelets  05/26/2014 306.0  150.0 - 400.0 K/uL Final  . Neutrophils Relative % 05/26/2014 50.0  43.0 - 77.0 % Final  . Lymphocytes Relative 05/26/2014 39.0  12.0 - 46.0 % Final  . Monocytes Relative 05/26/2014 7.9  3.0 - 12.0 % Final  . Eosinophils Relative 05/26/2014 2.7  0.0 - 5.0 % Final  . Basophils Relative 05/26/2014 0.4  0.0 - 3.0 % Final  . Neutro Abs 05/26/2014 5.7  1.4 - 7.7 K/uL Final  . Lymphs Abs 05/26/2014 4.4* 0.7 - 4.0 K/uL Final  . Monocytes Absolute 05/26/2014 0.9  0.1 - 1.0 K/uL Final  . Eosinophils Absolute 05/26/2014 0.3  0.0 - 0.7 K/uL Final  . Basophils Absolute 05/26/2014 0.0  0.0 - 0.1 K/uL Final  . Color, UA 05/26/2014 yellow   Final  . Clarity, UA 05/26/2014 clear   Final  . Glucose, UA 05/26/2014 n    Final  . Bilirubin, UA 05/26/2014 n   Final  . Ketones, UA 05/26/2014 n   Final  . Spec Grav, UA 05/26/2014 1.010   Final  . Blood, UA 05/26/2014 n   Final  . pH, UA 05/26/2014 5.0   Final  . Protein, UA 05/26/2014 2+   Final  . Urobilinogen, UA 05/26/2014 0.2   Final  . Nitrite, UA 05/26/2014 n   Final  . Leukocytes, UA 05/26/2014 Negative   Final  . TSH 05/26/2014 0.52  0.35 - 4.50 uIU/mL Final    Physical Examination:  BP 165/92 mmHg  Pulse 76  Temp(Src) 97.8 F (36.6 C)  Resp 16  Ht 5\' 5"  (1.651 m)  Wt 234 lb 12.8 oz (106.505 kg)  BMI 39.07 kg/m2  SpO2 95%        ASSESSMENT:  Diabetes type 2, uncontrolled with BMI about 40 and A1c of 8%     She is on a regimen of Lantus insulin and NovoLog irregularly As discussed in history of present illness she has not checked her readings after meals and difficult to know appropriate dose for her NovoLog Discussed day-to-day management including monitoring of glucose and compliance with mealtime insulin again Also discussed adjusting NovoLog based on type of meal  HYPERTENSION: Blood pressure is inconsistent and higher today, was fairly good on her last visit   PLAN:     She will be given a trial of the V-go pump with the 40 unit basal along with boluses of  4-6 units with every meal based on meal size.  She will let us know if her insurance will cover this adequately   However in the meantime she needs to try taking NovoLog with every meal and adjust this based on her carbohydrate intake, to reduce the dose when she is eating salads and take more when she is eating some fried food  Meanwhile she will continue taking 50 units of Lantus at suppertime instead of bedtime for better compliance   She will also need general diabetes education and information on day-to-day management from nurse educator    Patient Instructions  Please check blood sugars at least half the time about 2 hours after any meal and 3 times per week on  waking up.   Please bring blood sugar monitor to each visit.  Recommended blood sugar levels about 2 hours after meal is 140-180 and on waking up 90-130  May take Lantus right at supper for convenience  Must take Novolog with you when eating out  Adjust Novolog between 10-14 units based on amount of  Carbs and meal size     Babette Stum 07/21/2014, 10:14 AM   Note: This office note was prepared with Estate agent. Any transcriptional errors that result from this process are unintentional.

## 2014-08-12 ENCOUNTER — Other Ambulatory Visit: Payer: Self-pay | Admitting: Geriatric Medicine

## 2014-08-12 MED ORDER — METFORMIN HCL 1000 MG PO TABS: 1000.0000 mg | ORAL_TABLET | Freq: Two times a day (BID) | ORAL | Status: DC

## 2014-08-22 ENCOUNTER — Encounter: Payer: Medicare Other | Attending: Endocrinology | Admitting: Nutrition

## 2014-08-22 DIAGNOSIS — Z794 Long term (current) use of insulin: Secondary | ICD-10-CM | POA: Diagnosis not present

## 2014-08-22 DIAGNOSIS — E118 Type 2 diabetes mellitus with unspecified complications: Secondary | ICD-10-CM | POA: Insufficient documentation

## 2014-08-22 DIAGNOSIS — Z713 Dietary counseling and surveillance: Secondary | ICD-10-CM | POA: Insufficient documentation

## 2014-08-22 DIAGNOSIS — E1165 Type 2 diabetes mellitus with hyperglycemia: Secondary | ICD-10-CM

## 2014-08-22 DIAGNOSIS — IMO0002 Reserved for concepts with insufficient information to code with codable children: Secondary | ICD-10-CM

## 2014-08-23 NOTE — Patient Instructions (Signed)
Fill and apply a new V-go every morning at the same time Take 2-3 button presses before each meal Test blood sugars before meals and at bedtime. Call readings to Dr. Ronnie Derby office on Friday  [phone]

## 2014-08-23 NOTE — Progress Notes (Signed)
Mr. And Mrs Kolton were instructed on how to fill, apply and use the V-go.  She redemonstrated how to fill the V-Go correctly, using Novolog insulin.  She did not apply it, because she forgot and took her Lantus last night at 10PM.  She was told to apply it tonight at 8:30PM, but she wanted to wait until the morning.  She was told that her blood sugars may be higher, but that would be ok.  She was given a starter kit of V-go 40s with directions on how to fill, apply, use, and remove the V-go.  It was stressed that she will need to replace this every 24 hours despite the fact that there may be some remaining insulin in the pod.  Written instructions were given to take 3 button presses for each meal, and to decrease by 1 button press when eating less carbohydrates.  We reviewed what carbohydrates were and she reported good understanding of this.  We reviewed that each button press will be giving her 2u of insulin and she re verbalized this to me.  She was told to test her blood sugars before meals and at HS and to call the readings to Dr. Dwyane Dee on Friday.  She was given 20 Verio test strips to do this.  They had no final questions.

## 2014-08-26 ENCOUNTER — Telehealth: Payer: Self-pay | Admitting: *Deleted

## 2014-08-26 NOTE — Telephone Encounter (Signed)
Message left on patients vm, instructed her to call the office to schedule her 2 week f/u

## 2014-08-26 NOTE — Telephone Encounter (Signed)
Is she on the V-go pump? No change needed but need to see her back in 2 weeks, does not have follow-up appointment

## 2014-08-26 NOTE — Telephone Encounter (Signed)
Patient called with her blood sugar readings for the last 4 days.  Tuesday- Fasting 6:40 am-125  11:45 am -140 4:51 pm-129  Wednesday Fasting 10:37 am-169 1:27 pm-116 7 pm- 147 Bedtime 165  Thursday-6:57 am- 134 1:03 pm- 90 6:51pm- 129  Bedtime 114  Friday- 7:01 am 140  12:46 pm- 105

## 2014-08-30 ENCOUNTER — Other Ambulatory Visit: Payer: Self-pay | Admitting: *Deleted

## 2014-08-30 MED ORDER — V-GO 40 KIT: PACK | Status: DC

## 2014-08-30 MED ORDER — INSULIN ASPART 100 UNIT/ML ~~LOC~~ SOLN: SUBCUTANEOUS | Status: DC

## 2014-09-12 ENCOUNTER — Ambulatory Visit (INDEPENDENT_AMBULATORY_CARE_PROVIDER_SITE_OTHER): Payer: Medicare Other | Admitting: Endocrinology

## 2014-09-12 ENCOUNTER — Other Ambulatory Visit: Payer: Self-pay | Admitting: *Deleted

## 2014-09-12 ENCOUNTER — Encounter: Payer: Self-pay | Admitting: Endocrinology

## 2014-09-12 VITALS — BP 146/78 | HR 97 | Temp 98.7°F | Resp 16 | Ht 65.0 in | Wt 237.0 lb

## 2014-09-12 DIAGNOSIS — I10 Essential (primary) hypertension: Secondary | ICD-10-CM | POA: Diagnosis not present

## 2014-09-12 DIAGNOSIS — IMO0002 Reserved for concepts with insufficient information to code with codable children: Secondary | ICD-10-CM

## 2014-09-12 DIAGNOSIS — E118 Type 2 diabetes mellitus with unspecified complications: Secondary | ICD-10-CM

## 2014-09-12 DIAGNOSIS — E039 Hypothyroidism, unspecified: Secondary | ICD-10-CM

## 2014-09-12 DIAGNOSIS — E1165 Type 2 diabetes mellitus with hyperglycemia: Secondary | ICD-10-CM

## 2014-09-12 MED ORDER — INSULIN LISPRO 100 UNIT/ML ~~LOC~~ SOLN: SUBCUTANEOUS | Status: DC

## 2014-09-12 NOTE — Patient Instructions (Addendum)
Check blood sugars on waking up .Marland Kitchen2-3  .Marland Kitchen times a week Also check blood sugars about 2 hours after a meal and do this after different meals by rotation  Recommended blood sugar levels on waking up is 90-130 and about 2 hours after meal is 140-180  If sugars after meals are > 235 then use 4 clicks  For that meal  Please bring blood sugar monitor to each visit.  Restart exercise and take sugar test before after

## 2014-09-12 NOTE — Progress Notes (Signed)
Patient ID: Marissa Roberts, female   DOB: 1946/02/20, 69 y.o.   MRN: [id number]           Reason for Appointment:  Follow-up for Type 2 Diabetes  Referring physician: Megan Salon  History of Present Illness:          Diagnosis: Type 2 diabetes mellitus, date of diagnosis: 2005       Past history:  She thinks her blood sugars were only mildly increased at borderline levels at the onsetper Most likely she was treated with metformin initially and this has been continued.   At some point she was also tried on [place] and [place] for improving her control. She is not sure why she was started on insulin 5 years ago, presumably for worsening hyperglycemia Also not clear if she has tried various insulin regimens before starting Lantus and NovoLog Her A1c has been usually over 7% and gradually increasing over the last year  Recent history:   INSULIN regimen is described as: V-go insulin pump, 40 units basal with boluses 6 units per meal, previously on Lantus 50 hs, Novolog 12 units  She  had poor control of her diabetes with taking Lantus insulin and NovoLog with last A1c 8% This is partly because of her tendency to high postprandial readings and being somewhat noncompliant with her mealtime insulin especially when eating out and occasionally forgetting Lantus also  Because of this she was switched to the V-go pump in 5/16 with the help of the nurse educator With switching to the V-go pump she has been much more compliant with her mealtime boluses However she is using only 6 units to cover meals which is about half of her previous dose She has only couple of readings after meals which are mildly increased, however one of them was related to her getting mildly hypoglycemic with skipping a meal and then overeating FASTING blood sugars are excellent especially recently and are fairly steady between 114-130 She forgets to do postprandial readings and she says that whenever she remembers she has  already eaten within an hour No hypoglycemia recently She is however complaining about the cost of the V-go pump in the insulin  Oral hypoglycemic drugs the patient is taking are: Metformin 2 g daily    Side effects from medications have been:none  Compliance with the medical regimen: poor Hypoglycemia: rare if late for meal   Glucose monitoring:  done one time a day         Glucometer:  One Touch.      Blood Glucose readings by time of day recently as above, postprandial readings 123-191 with only 3 readings Overall median recently 130   Self-care: The diet that the patient has been following is: tries to limit sweets , generally has avoided drinks with sugar.     Meals: 3 meals per day. Breakfast is toast, bacon, egg; usually not a lot of carbohydrates, some fried food including fish.    Not always restricting fat intake like gravy and meat          Exercise:  none now, previously up to 5 days  a week for about an hour at the gym in the mornings         Dietician visit, most recent:2014.               Weight history:  Wt Readings from Last 3 Encounters:  09/12/14 237 lb (107.502 kg)  07/21/14 234 lb 12.8 oz (106.505 kg)  07/15/14 232  lb (105.235 kg)    Glycemic control:   Lab Results  Component Value Date   HGBA1C 8.0* 05/26/2014   HGBA1C 7.7* 01/07/2014   HGBA1C 7.5* 08/25/2013   Lab Results  Component Value Date   MICROALBUR 21.5* 02/28/2014   LDLCALC 136* 05/26/2014   CREATININE 0.77 05/26/2014         Medication List       This list is accurate as of: 09/12/14 11:59 PM.  Always use your most recent med list.               amLODipine-benazepril 5-40 MG per capsule  Commonly known as:  LOTREL  Take 1 capsule by mouth daily.     CALCIUM 600 1500 (600 CA) MG Tabs  Generic drug:  Calcium Carbonate  Take 2 tablets by mouth daily.     cyanocobalamin 100 MCG tablet  Take 100 mcg by mouth daily.     EQ FIBER SUPPLEMENT PO  Take by mouth.      fluticasone 50 MCG/ACT nasal spray  Commonly known as:  FLONASE  Place 2 sprays into both nostrils daily.     furosemide 40 MG tablet  Commonly known as:  LASIX  Take 2 tablets (80 mg total) by mouth daily.     glucosamine-chondroitin 500-400 MG tablet  Take 1 tablet by mouth daily.     glucose blood test strip  Commonly known as:  ONETOUCH VERIO  Please check blood sugars at least half the time about 2 hours after any meal and 3 times per week on waking up. Dx code E11.9     insulin aspart 100 UNIT/ML injection  Commonly known as:  NOVOLOG  Use upto 78 units daily in V-Go pump     insulin lispro 100 UNIT/ML injection  Commonly known as:  HUMALOG  Use max 78 units daily with V-Go pump     Insulin Pen Needle 31G X 8 MM Misc  Commonly known as:  B-D ULTRAFINE III SHORT PEN  1 each by Does not apply route daily. To use with lantus     LANTUS SOLOSTAR 100 UNIT/ML Solostar Pen  Generic drug:  Insulin Glargine  INJECT 55 UNITS INTO THE SKIN AT BEDTIME     levothyroxine 137 MCG tablet  Commonly known as:  SYNTHROID, LEVOTHROID  TAKE ONE TABLET BY MOUTH ONCE DAILY     LINZESS 290 MCG Caps capsule  Generic drug:  Linaclotide     metFORMIN 1000 MG tablet  Commonly known as:  GLUCOPHAGE  Take 1 tablet (1,000 mg total) by mouth 2 (two) times daily with a meal.     omeprazole 20 MG capsule  Commonly known as:  PRILOSEC  Take 40 mg by mouth daily.     ONETOUCH DELICA LANCETS FINE Misc  Please check blood sugars at least half the time about 2 hours after any meal and 3 times per week on waking up. Dx code E11.9     potassium chloride 10 MEQ tablet  Commonly known as:  K-DUR  Take 1 tablet (10 mEq total) by mouth 2 (two) times daily.     PROBIOTIC DAILY PO  Take by mouth.     simvastatin 20 MG tablet  Commonly known as:  ZOCOR  TAKE ONE TABLET BY MOUTH AT BEDTIME     traMADol 50 MG tablet  Commonly known as:  ULTRAM  Take 50 mg by mouth every 6 (six) hours as needed.  V-GO 40 Kit  Use one per day        Allergies:  Allergies  Allergen Reactions  . Contrast Media [Iodinated Diagnostic Agents]     Throat swells from ct con tra  . Adhesive [Tape]     No paper tape, causes blisters  . Erythromycin     REACTION: rash  . Penicillins     REACTION: rash    Past Medical History  Diagnosis Date  . Hypertension   . Thyroid disease   . Diabetes mellitus   . CHF (congestive heart failure)   . PONV (postoperative nausea and vomiting)   . Hypothyroidism   . Pneumonia     hx of  . Bronchitis     hx of  . GERD (gastroesophageal reflux disease)   . Arthritis   . Hypercholesteremia     Past Surgical History  Procedure Laterality Date  . Knee arhtroscopy    . Carpel tunnel    . Foot surgery    . Abdominal hysterectomy      partial  . Tonsillectomy    . Breast surgery      lumpectomy left breast  . Colonoscopy w/ polypectomy    . Shoulder arthroscopy with subacromial decompression, rotator cuff repair and bicep tendon repair Right 05/26/2012    Procedure: Right Shoulder Diagnostic Operative Arthroscopy, Subacromial Decompression, Biceps Tenodesis, Mini Open Rotator Cuff Repair;  Surgeon: [name], MD;  Location: [facility];  Service: Orthopedics;  Laterality: Right;  Right Shoulder Diagnostic Operative Arthroscopy, Subacromial Decompression, Biceps Tenodesis, Mini Open Rotator Cuff Repair    Family History  Problem Relation Age of Onset  . Adopted: Yes  . Coronary artery disease    . Diabetes Mother   . Heart disease Mother   . Hyperlipidemia Mother     Social History:  reports that she quit smoking about 24 years ago. She has never used smokeless tobacco. She reports that she does not drink alcohol or use illicit drugs.    Review of Systems       Vision is normal. Most recent eye exam was 6/15       Lipids: she thinks she is taking simvastatin but not always remembering it, she thinks it is difficult to remember it at  bedtime.  Lipids are poorly controlled       Lab Results  Component Value Date   CHOL 217* 05/26/2014   HDL 56.20 05/26/2014   LDLCALC 136* 05/26/2014   LDLDIRECT 129.5 02/06/2012   TRIG 124.0 05/26/2014   CHOLHDL 4 05/26/2014                    Thyroid:  No  unusual fatigue.  She thinks she has been hypothyroid for about 40 years and is regular with her supplement of 137 g Levothroid  Lab Results  Component Value Date   TSH 0.52 05/26/2014   TSH 0.40 01/07/2014   TSH 0.37 06/09/2013   FREET4 1.03 06/28/2010   FREET4 1.36 11/27/2007        The blood pressure has been difficult to control previously.  Currently taking Lotrel only Also on potassium supplements with her Lasix    Physical Examination:  BP 146/78 mmHg  Pulse 97  Temp(Src) 98.7 F (37.1 C) (Oral)  Resp 16  Ht $R'5\' 5"'Ga$  (1.651 m)  Wt 237 lb (107.502 kg)  BMI 39.44 kg/m2  SpO2 93%        ASSESSMENT/PLAN:  Diabetes type 2, uncontrolled with  BMI about 40  She has done better with the V-go pump as judged by her recent blood sugars This has improved her compliance for mealtime insulin and also she is requiring about half as much insulin for boluses However difficult to know if this is actually controlling her postprandial readings that she has only sporadically done readings after meals  She is complaining a lot about the cost of the V-go pump as well as the brand name insulin She also thinks that she is having significant amount of insulin leftover in the pump when she changes it and advised her to call the pump manufacturer about this  Discussed however that since she is getting better control and much better compliance in the long run she will be able to keep her A1c down better controlled and avoid potential complications Given her a coupon for a free bottle of Humalog insulin  She may still be having some postprandial hyperglycemia which may not be controlled Discussed day-to-day management including  monitoring of glucose and blood sugar targets; she can check her sugars in between meals even if it is not exactly 2 hours after eating Also discussed adjusting the boluses based on type of meal she is having, carbohydrate intake Also if she finds her postprandial readings consistently over 180 she will increase her boluses to at least 4 clicks for that meal Consider consultation with dietitian also  HYPERTENSION: Blood pressure is  relatively better today   Patient Instructions  Check blood sugars on waking up .Marland Kitchen2-3  .Marland Kitchen times a week Also check blood sugars about 2 hours after a meal and do this after different meals by rotation  Recommended blood sugar levels on waking up is 90-130 and about 2 hours after meal is 140-180  If sugars after meals are > 465 then use 4 clicks  For that meal  Please bring blood sugar monitor to each visit.  Restart exercise and take sugar test before after    Counseling time on subjects discussed above is over 50% of today's 25 minute visit  Marissa Roberts 09/13/2014, 8:40 AM   Note: This office note was prepared with Estate agent. Any transcriptional errors that result from this process are unintentional.

## 2014-09-22 ENCOUNTER — Other Ambulatory Visit: Payer: Self-pay | Admitting: Family

## 2014-09-28 ENCOUNTER — Telehealth: Payer: Self-pay | Admitting: Endocrinology

## 2014-09-28 NOTE — Telephone Encounter (Signed)
Yes

## 2014-09-28 NOTE — Telephone Encounter (Signed)
Can pt use Humalog (Dr. Dwyane Dee gave her the sample card)  instead of the Novolog which is what she was been taking for a while now.

## 2014-11-11 ENCOUNTER — Other Ambulatory Visit (INDEPENDENT_AMBULATORY_CARE_PROVIDER_SITE_OTHER): Payer: Medicare Other

## 2014-11-11 ENCOUNTER — Other Ambulatory Visit: Payer: Medicare Other

## 2014-11-11 DIAGNOSIS — E118 Type 2 diabetes mellitus with unspecified complications: Secondary | ICD-10-CM | POA: Diagnosis not present

## 2014-11-11 DIAGNOSIS — E1165 Type 2 diabetes mellitus with hyperglycemia: Secondary | ICD-10-CM

## 2014-11-11 DIAGNOSIS — E039 Hypothyroidism, unspecified: Secondary | ICD-10-CM | POA: Diagnosis not present

## 2014-11-11 DIAGNOSIS — IMO0002 Reserved for concepts with insufficient information to code with codable children: Secondary | ICD-10-CM

## 2014-11-11 LAB — COMPREHENSIVE METABOLIC PANEL
ALBUMIN: 3.9 g/dL (ref 3.5–5.2)
ALK PHOS: 66 U/L (ref 39–117)
ALT: 22 U/L (ref 0–35)
AST: 16 U/L (ref 0–37)
BUN: 8 mg/dL (ref 6–23)
CO2: 27 mEq/L (ref 19–32)
Calcium: 9.5 mg/dL (ref 8.4–10.5)
Chloride: 102 mEq/L (ref 96–112)
Creatinine, Ser: 0.74 mg/dL (ref 0.40–1.20)
GFR: 100.61 mL/min (ref >60.00)
GLUCOSE: 152 mg/dL — AB (ref 70–99)
POTASSIUM: 3.9 meq/L (ref 3.5–5.1)
SODIUM: 139 meq/L (ref 135–145)
TOTAL PROTEIN: 7.2 g/dL (ref 6.0–8.3)
Total Bilirubin: 0.3 mg/dL (ref 0.2–1.2)

## 2014-11-11 LAB — TSH: TSH: 0.42 u[IU]/mL (ref 0.35–4.50)

## 2014-11-11 LAB — HEMOGLOBIN A1C: HEMOGLOBIN A1C: 7.5 % — AB (ref 4.6–6.5)

## 2014-11-14 ENCOUNTER — Encounter: Payer: Self-pay | Admitting: Endocrinology

## 2014-11-14 ENCOUNTER — Ambulatory Visit (INDEPENDENT_AMBULATORY_CARE_PROVIDER_SITE_OTHER): Payer: Medicare Other | Admitting: Endocrinology

## 2014-11-14 ENCOUNTER — Other Ambulatory Visit: Payer: Self-pay | Admitting: *Deleted

## 2014-11-14 VITALS — BP 128/84 | HR 84 | Temp 97.9°F | Resp 16 | Ht 65.0 in | Wt 232.2 lb

## 2014-11-14 DIAGNOSIS — I10 Essential (primary) hypertension: Secondary | ICD-10-CM | POA: Diagnosis not present

## 2014-11-14 DIAGNOSIS — E118 Type 2 diabetes mellitus with unspecified complications: Secondary | ICD-10-CM | POA: Diagnosis not present

## 2014-11-14 DIAGNOSIS — E1165 Type 2 diabetes mellitus with hyperglycemia: Secondary | ICD-10-CM

## 2014-11-14 DIAGNOSIS — IMO0002 Reserved for concepts with insufficient information to code with codable children: Secondary | ICD-10-CM

## 2014-11-14 MED ORDER — INSULIN GLARGINE 100 UNIT/ML SOLOSTAR PEN: PEN_INJECTOR | SUBCUTANEOUS | Status: DC

## 2014-11-14 NOTE — Patient Instructions (Addendum)
Lantus 50 units every am when of pump  Novolog 6-8 u its before each meal based on meal size and Carbs

## 2014-11-14 NOTE — Progress Notes (Signed)
Patient ID: Marissa Roberts, female   DOB: 04-Jul-1945, 69 y.o.   MRN: [id number]           Reason for Appointment:  Follow-up for Type 2 Diabetes  Referring physician: Megan Salon  History of Present Illness:          Diagnosis: Type 2 diabetes mellitus, date of diagnosis: 2005       Past history:  She thinks her blood sugars were only mildly increased at borderline levels at the onsetper Most likely she was treated with metformin initially and this has been continued.   At some point she was also tried on [place] and [place] for improving her control. She is not sure why she was started on insulin 5 years ago, presumably for worsening hyperglycemia Also not clear if she has tried various insulin regimens before starting Lantus and NovoLog Her A1c has been usually over 7% and gradually increasing over the last year  Recent history:   INSULIN regimen is described as: V-go insulin pump, 40 units basal with boluses 6 units per meal, previously on Lantus 50 hs, Novolog 12 units  She  had poor control of her diabetes with taking Lantus insulin and NovoLog with  A1c 8% This is partly because of her tendency to high postprandial readings and being somewhat noncompliant with her mealtime insulin especially when eating out and occasionally forgetting Lantus also  Because of this she was switched to the V-go pump in 5/16 with the help of the nurse educator With switching to the V-go pump she has been much more compliant with her mealtime boluses and is requiring only half the amount of mealtime coverage  Current blood sugar patterns and problems identified:  A1c is improved at 7.5  However her fasting blood sugars are inconsistent and as high as 190, previously were more consistent  She rarely checks blood sugars after meals especially recently and these may be higher at times  She thinks she is fairly compliant with the V-go pump boluses  She is not complaining about the pump causing  some burning and skin changes with the application and also concerned about the cost.  She wants to stop using is now  Still has difficulty losing weight although it is slightly better today No hypoglycemia recently  Oral hypoglycemic drugs the patient is taking are: Metformin 2 g daily    Side effects from medications have been:none  Compliance with the medical regimen: Fair Hypoglycemia: rare if late for meal   Glucose monitoring:  done one time a day         Glucometer:  One Touch.      Blood Glucose readings by time of day recently as above, postprandial readings 123-191 with only 3 readings Overall median recently 130  Mean values apply above for all meters except median for One Touch  PRE-MEAL Fasting Lunch Dinner Bedtime Overall  Glucose range: 91-190     142-200    Mean/median:  148     156  150   Self-care: The diet that the patient has been following is: tries to limit sweets , generally has avoided drinks with sugar.     Meals: 3 meals per day. Breakfast is toast, bacon, egg; usually not a lot of carbohydrates, some fried food including fish.    Not always restricting fat intake      Exercise:  none now, previously up to 5 days  a week for about an hour at the gym in the mornings  Dietician visit, most recent:2014.               Weight history:  Wt Readings from Last 3 Encounters:  11/14/14 232 lb 3.2 oz (105.325 kg)  09/12/14 237 lb (107.502 kg)  07/21/14 234 lb 12.8 oz (106.505 kg)    Glycemic control:   Lab Results  Component Value Date   HGBA1C 7.5* 11/11/2014   HGBA1C 8.0* 05/26/2014   HGBA1C 7.7* 01/07/2014   Lab Results  Component Value Date   MICROALBUR 21.5* 02/28/2014   LDLCALC 136* 05/26/2014   CREATININE 0.74 11/11/2014         Medication List       This list is accurate as of: 11/14/14 11:59 PM.  Always use your most recent med list.               amLODipine-benazepril 5-40 MG per capsule  Commonly known as:  LOTREL  TAKE  ONE CAPSULE BY MOUTH ONCE DAILY     CALCIUM 600 1500 (600 CA) MG Tabs  Generic drug:  Calcium Carbonate  Take 2 tablets by mouth daily.     cyanocobalamin 100 MCG tablet  Take 100 mcg by mouth daily.     EQ FIBER SUPPLEMENT PO  Take by mouth.     fluticasone 50 MCG/ACT nasal spray  Commonly known as:  FLONASE  Place 2 sprays into both nostrils daily.     furosemide 40 MG tablet  Commonly known as:  LASIX  Take 2 tablets (80 mg total) by mouth daily.     glucosamine-chondroitin 500-400 MG tablet  Take 1 tablet by mouth daily.     glucose blood test strip  Commonly known as:  ONETOUCH VERIO  Please check blood sugars at least half the time about 2 hours after any meal and 3 times per week on waking up. Dx code E11.9     insulin aspart 100 UNIT/ML injection  Commonly known as:  NOVOLOG  Use upto 78 units daily in V-Go pump     Insulin Glargine 100 UNIT/ML Solostar Pen  Commonly known as:  LANTUS SOLOSTAR  INJECT 55 UNITS INTO THE SKIN AT BEDTIME     insulin lispro 100 UNIT/ML injection  Commonly known as:  HUMALOG  Use max 78 units daily with V-Go pump     Insulin Pen Needle 31G X 8 MM Misc  Commonly known as:  B-D ULTRAFINE III SHORT PEN  1 each by Does not apply route daily. To use with lantus     levothyroxine 137 MCG tablet  Commonly known as:  SYNTHROID, LEVOTHROID  TAKE ONE TABLET BY MOUTH ONCE DAILY     LINZESS 290 MCG Caps capsule  Generic drug:  Linaclotide     metFORMIN 1000 MG tablet  Commonly known as:  GLUCOPHAGE  Take 1 tablet (1,000 mg total) by mouth 2 (two) times daily with a meal.     omeprazole 20 MG capsule  Commonly known as:  PRILOSEC  Take 40 mg by mouth daily.     ONETOUCH DELICA LANCETS FINE Misc  Please check blood sugars at least half the time about 2 hours after any meal and 3 times per week on waking up. Dx code E11.9     potassium chloride 10 MEQ tablet  Commonly known as:  K-DUR  Take 1 tablet (10 mEq total) by mouth 2 (two)  times daily.     PROBIOTIC DAILY PO  Take by mouth.     simvastatin  20 MG tablet  Commonly known as:  ZOCOR  TAKE ONE TABLET BY MOUTH AT BEDTIME     traMADol 50 MG tablet  Commonly known as:  ULTRAM  Take 50 mg by mouth every 6 (six) hours as needed.     V-GO 40 Kit  Use one per day        Allergies:  Allergies  Allergen Reactions  . Contrast Media [Iodinated Diagnostic Agents]     Throat swells from ct con tra  . Adhesive [Tape]     No paper tape, causes blisters  . Erythromycin     REACTION: rash  . Penicillins     REACTION: rash    Past Medical History  Diagnosis Date  . Hypertension   . Thyroid disease   . Diabetes mellitus   . CHF (congestive heart failure)   . PONV (postoperative nausea and vomiting)   . Hypothyroidism   . Pneumonia     hx of  . Bronchitis     hx of  . GERD (gastroesophageal reflux disease)   . Arthritis   . Hypercholesteremia     Past Surgical History  Procedure Laterality Date  . Knee arhtroscopy    . Carpel tunnel    . Foot surgery    . Abdominal hysterectomy      partial  . Tonsillectomy    . Breast surgery      lumpectomy left breast  . Colonoscopy w/ polypectomy    . Shoulder arthroscopy with subacromial decompression, rotator cuff repair and bicep tendon repair Right 05/26/2012    Procedure: Right Shoulder Diagnostic Operative Arthroscopy, Subacromial Decompression, Biceps Tenodesis, Mini Open Rotator Cuff Repair;  Surgeon: [name], MD;  Location: [facility];  Service: Orthopedics;  Laterality: Right;  Right Shoulder Diagnostic Operative Arthroscopy, Subacromial Decompression, Biceps Tenodesis, Mini Open Rotator Cuff Repair    Family History  Problem Relation Age of Onset  . Adopted: Yes  . Coronary artery disease    . Diabetes Mother   . Heart disease Mother   . Hyperlipidemia Mother     Social History:  reports that she quit smoking about 24 years ago. She has never used smokeless tobacco. She reports that  she does not drink alcohol or use illicit drugs.    Review of Systems   Most recent eye exam was 6/15       Lipids: she thinks she is taking simvastatin more regularly since her last visit when her lipids were not controlled       Lab Results  Component Value Date   CHOL 217* 05/26/2014   HDL 56.20 05/26/2014   LDLCALC 136* 05/26/2014   LDLDIRECT 129.5 02/06/2012   TRIG 124.0 05/26/2014   CHOLHDL 4 05/26/2014                    Thyroid:    She thinks she has been hypothyroid for about 40 years and is regular with her supplement of 137 g Levothroid  Lab Results  Component Value Date   TSH 0.42 11/11/2014   TSH 0.52 05/26/2014   TSH 0.40 01/07/2014   FREET4 1.03 06/28/2010   FREET4 1.36 11/27/2007        The blood pressure has been difficult to control previously.  Currently taking Lotrel  Also on potassium supplements with her Lasix    Physical Examination:  BP 128/84 mmHg  Pulse 84  Temp(Src) 97.9 F (36.6 C)  Resp 16  Ht $R'5\' 5"'LW$  (1.651  m)  Wt 232 lb 3.2 oz (105.325 kg)  BMI 38.64 kg/m2  SpO2 97%        ASSESSMENT/PLAN:  Diabetes type 2, uncontrolled with BMI about 40  She has done better with the V-go pump as judged by her overall blood sugars and A1c This has improved her compliance for mealtime insulin and also she is requiring about half as much insulin for boluses and less basal insulin She is however refusing to continue this because of some skin irritation, cost and discomfort with the pump  Discussed starting back on Lantus and Novolog and she will go back to 15 units of Lantus and 12 units of Novolog at mealtimes She was told to be better compliant with her Novolog injections than she was before and also adjust the dose based on meal size Also needs to check more blood sugars after meals   Patient Instructions  Lantus 50 units every am when of pump  Novolog 6-8 u its before each meal based on meal size and  Carbs                                                          Aaralyn Kil 11/15/2014, 8:49 AM   Note: This office note was prepared with Dragon voice recognition system technology. Any transcriptional errors that result from this process are unintentional.

## 2014-11-16 ENCOUNTER — Ambulatory Visit (INDEPENDENT_AMBULATORY_CARE_PROVIDER_SITE_OTHER): Payer: Medicare Other | Admitting: Internal Medicine

## 2014-11-16 ENCOUNTER — Other Ambulatory Visit (INDEPENDENT_AMBULATORY_CARE_PROVIDER_SITE_OTHER): Payer: Medicare Other

## 2014-11-16 ENCOUNTER — Encounter: Payer: Self-pay | Admitting: Internal Medicine

## 2014-11-16 VITALS — BP 142/88 | HR 80 | Temp 98.1°F | Resp 16 | Ht 65.0 in | Wt 237.0 lb

## 2014-11-16 DIAGNOSIS — E538 Deficiency of other specified B group vitamins: Secondary | ICD-10-CM | POA: Diagnosis not present

## 2014-11-16 DIAGNOSIS — I10 Essential (primary) hypertension: Secondary | ICD-10-CM

## 2014-11-16 DIAGNOSIS — G44219 Episodic tension-type headache, not intractable: Secondary | ICD-10-CM

## 2014-11-16 LAB — CBC
HCT: 35.3 % — ABNORMAL LOW (ref 36.0–46.0)
Hemoglobin: 11.6 g/dL — ABNORMAL LOW (ref 12.0–15.0)
MCHC: 32.7 g/dL (ref 30.0–36.0)
MCV: 79.1 fl (ref 78.0–100.0)
PLATELETS: 280 10*3/uL (ref 150.0–400.0)
RBC: 4.46 Mil/uL (ref 3.87–5.11)
RDW: 16.5 % — ABNORMAL HIGH (ref 11.5–15.5)
WBC: 11.4 10*3/uL — AB (ref 4.0–10.5)

## 2014-11-16 LAB — SEDIMENTATION RATE: Sed Rate: 31 mm/hr — ABNORMAL HIGH (ref 0–22)

## 2014-11-16 LAB — VITAMIN B12: VITAMIN B 12: 845 pg/mL (ref 211–911)

## 2014-11-16 NOTE — Progress Notes (Signed)
Pre visit review using our clinic review tool, if applicable. No additional management support is needed unless otherwise documented below in the visit note. 

## 2014-11-16 NOTE — Patient Instructions (Signed)
We will check some labs today for the B12 levels to see if this is affecting the energy.   We are not going to change the blood pressure medicines today. I would recommend to start taking the flonase for the headache to see if it helps.

## 2014-11-18 DIAGNOSIS — R519 Headache, unspecified: Secondary | ICD-10-CM | POA: Insufficient documentation

## 2014-11-18 DIAGNOSIS — R51 Headache: Secondary | ICD-10-CM

## 2014-11-18 NOTE — Assessment & Plan Note (Signed)
BP mildly elevated and recheck at goal. No adjustment to regimen today, continue amlodipine/benazepril and lasix. Likely not the cause of her headache.

## 2014-11-18 NOTE — Progress Notes (Signed)
   Subjective:    Patient ID: Marissa Roberts, female    DOB: 08/09/1945, 69 y.o.   MRN: [id number]  HPI The patient is a 69 YO female coming in for headaches. She has been having some nasal congestion and sinus pressure the last several days and has been having some headaches. She takes tylenol and they improve. Has been taking daily for the last 3 days. Overall sinuses are getting worse. She has not tried anything for them. No fevers or chills. Also feeling slightly tired and wonders if she has any vitamin deficiencies.  Review of Systems  Constitutional: Negative for fever, activity change, appetite change, fatigue and unexpected weight change.  HENT: Positive for congestion and sinus pressure. Negative for ear discharge, ear pain, postnasal drip and trouble swallowing.   Respiratory: Negative for cough, chest tightness, shortness of breath and wheezing.   Cardiovascular: Negative.   Gastrointestinal: Negative.   Neurological: Positive for headaches.      Objective:   Physical Exam  Constitutional: She is oriented to person, place, and time. She appears well-developed and well-nourished.  Overweight  HENT:  Head: Normocephalic and atraumatic.  Mouth/Throat: Oropharynx is clear and moist.  Nasal turbinates with erythema and swollen, facial tenderness  Eyes: EOM are normal.  Neck: Normal range of motion.  Cardiovascular: Normal rate.   Pulmonary/Chest: Effort normal. No respiratory distress. She has no wheezes. She has no rales.  Abdominal: Soft.  Musculoskeletal: She exhibits no edema.  Neurological: She is alert and oriented to person, place, and time. Coordination normal.  Skin: Skin is warm and dry.   Filed Vitals:   11/16/14 0937 11/16/14 1007  BP: 150/72 142/88  Pulse: 80   Temp: 98.1 F (36.7 C)   TempSrc: Oral   Resp: 16   Height: 5\' 5"  (1.651 m)   Weight: 237 lb (107.502 kg)   SpO2: 96%       Assessment & Plan:

## 2014-11-18 NOTE — Assessment & Plan Note (Signed)
Likely associated with her sinus congestion. Not currently taking her flonase and refill sent in and advised to resume. No indication for other workup at this time.

## 2014-11-21 ENCOUNTER — Telehealth: Payer: Self-pay | Admitting: Endocrinology

## 2014-11-21 MED ORDER — INSULIN ASPART 100 UNIT/ML ~~LOC~~ SOLN
SUBCUTANEOUS | Status: DC
Start: 2014-11-21 — End: 2014-11-23

## 2014-11-21 NOTE — Telephone Encounter (Signed)
Patient need a refill of her Novalog, sent to [facility] on [address].

## 2014-11-23 ENCOUNTER — Other Ambulatory Visit: Payer: Self-pay | Admitting: *Deleted

## 2014-11-23 ENCOUNTER — Telehealth: Payer: Self-pay | Admitting: Endocrinology

## 2014-11-23 MED ORDER — INSULIN PEN NEEDLE 31G X 8 MM MISC: Status: DC

## 2014-11-23 MED ORDER — INSULIN ASPART 100 UNIT/ML FLEXPEN
PEN_INJECTOR | SUBCUTANEOUS | Status: DC
Start: 2014-11-23 — End: 2015-06-26

## 2014-11-23 NOTE — Telephone Encounter (Signed)
Rhonda please call pt about change in medication that insurance will cover [phone]

## 2014-11-23 NOTE — Telephone Encounter (Signed)
Patient called today

## 2014-12-12 ENCOUNTER — Other Ambulatory Visit: Payer: Self-pay | Admitting: Family

## 2014-12-12 LAB — HM DIABETES EYE EXAM

## 2014-12-14 ENCOUNTER — Telehealth: Payer: Self-pay | Admitting: Internal Medicine

## 2014-12-14 DIAGNOSIS — E039 Hypothyroidism, unspecified: Secondary | ICD-10-CM

## 2014-12-14 MED ORDER — LEVOTHYROXINE SODIUM 137 MCG PO TABS: ORAL_TABLET | ORAL | Status: DC

## 2014-12-14 NOTE — Telephone Encounter (Signed)
Rx sent in, is there a way for [place] to communicate with them about forwarding rxes from transferred patients instead of refusing to help with patient care?

## 2014-12-14 NOTE — Telephone Encounter (Signed)
Patient called stating she is completely out of her levothyroxine. It was previously prescribed by [facility] and they refused it when the pharmacy sent in the initial request. She needs new rx sent to [facility] on [address]

## 2014-12-15 ENCOUNTER — Other Ambulatory Visit: Payer: Self-pay | Admitting: Internal Medicine

## 2014-12-20 ENCOUNTER — Other Ambulatory Visit: Payer: Self-pay | Admitting: Internal Medicine

## 2014-12-27 ENCOUNTER — Ambulatory Visit (INDEPENDENT_AMBULATORY_CARE_PROVIDER_SITE_OTHER): Payer: Medicare Other | Admitting: Endocrinology

## 2014-12-27 ENCOUNTER — Encounter: Payer: Self-pay | Admitting: Endocrinology

## 2014-12-27 VITALS — BP 142/88 | HR 79 | Temp 98.4°F | Resp 16 | Ht 65.0 in | Wt 240.2 lb

## 2014-12-27 DIAGNOSIS — I10 Essential (primary) hypertension: Secondary | ICD-10-CM | POA: Diagnosis not present

## 2014-12-27 DIAGNOSIS — IMO0002 Reserved for concepts with insufficient information to code with codable children: Secondary | ICD-10-CM

## 2014-12-27 DIAGNOSIS — E1165 Type 2 diabetes mellitus with hyperglycemia: Secondary | ICD-10-CM

## 2014-12-27 DIAGNOSIS — R600 Localized edema: Secondary | ICD-10-CM | POA: Diagnosis not present

## 2014-12-27 NOTE — Patient Instructions (Signed)
Check blood sugars on waking up ..  .. times a week Also check blood sugars about 2 hours after a meal and do this after different meals by rotation Recommended blood sugar levels on waking up is 90-130 and about 2 hours after meal is 140-180 Please bring blood sugar monitor to each visit.  Extra fluid pill for 3 days

## 2014-12-27 NOTE — Progress Notes (Signed)
Patient ID: Marissa Roberts, female   DOB: 1945-12-09, 69 y.o.   MRN: [id number]           Reason for Appointment:  Follow-up for Type 2 Diabetes  Referring physician: Megan Salon  History of Present Illness:          Diagnosis: Type 2 diabetes mellitus, date of diagnosis: 2005       Past history:  She thinks her blood sugars were only mildly increased at borderline levels at the onsetper Most likely she was treated with metformin initially and this has been continued.   At some point she was also tried on [place] and [place] for improving her control. She is not sure why she was started on insulin 5 years ago, presumably for worsening hyperglycemia Also not clear if she has tried various insulin regimens before starting Lantus and NovoLog Her A1c has been usually over 7% and gradually increasing over the last year  Recent history:   INSULIN regimen is  Lantus 50 in am, Novolog 14 units ac   She had poor control of her diabetes with taking Lantus insulin and NovoLog with  A1c 8% Because of difficulty with compliance with mealtime Novolog she was switched to the V-go pump with which she was able to get better control and smaller amount of insulin compared to subcutaneous injections Her A1c had come down to 7.5 However she stopped doing the V-go pump because of the cost, skin irritation and local discomfort  She has been resumed on Lantus and Novolog but now taking Lantus in the mornings  Current blood sugar patterns, management and problems identified:  Her blood sugars appear to be fairly good now with her basal bolus insulin regimen  She is also generally better compliant with taking her mealtime insulin when she is eating out and is keeping her pain with her  However difficult to assess her postprandial readings that she has only sporadic monitoring the evening and none recently; had only a couple of high readings about 3 weeks ago  Although her blood sugars in the fasting  state are fluctuating somewhat they are averaging about 120  Still has difficulty losing weight and this has gone up significantly  Not able to exercise recently and has not been motivated to go back to the gym  No hypoglycemia recently  Oral hypoglycemic drugs the patient is taking are: Metformin 2 g daily    Side effects from medications have been:none  Compliance with the medical regimen: Fair  Glucose monitoring:  done 1-2 time a day         Glucometer:  One Touch.      Blood Glucose readings by time of day   Mean values apply above for all meters except median for One Touch  PRE-MEAL Fasting Lunch Dinner Bedtime Overall  Glucose range: 90-164    130, 153   129-1 87    Mean/median: 121     129+/-26     Self-care: The diet that the patient has been following is: tries to limit sweets , generally has avoided drinks with sugar.     Meals: 3 meals per day. Breakfast is toast, bacon, egg; usually not a lot of carbohydrates, some fried food including fish.    Not always restricting fat intake      Exercise:  none now, previously up to 5 days  a week for about an hour at the gym in the mornings         Dietician  visit, most recent:2014.               Weight history:  Wt Readings from Last 3 Encounters:  12/27/14 240 lb 3.2 oz (108.954 kg)  11/16/14 237 lb (107.502 kg)  11/14/14 232 lb 3.2 oz (105.325 kg)    Glycemic control:   Lab Results  Component Value Date   HGBA1C 7.5* 11/11/2014   HGBA1C 8.0* 05/26/2014   HGBA1C 7.7* 01/07/2014   Lab Results  Component Value Date   MICROALBUR 21.5* 02/28/2014   LDLCALC 136* 05/26/2014   CREATININE 0.74 11/11/2014         Medication List       This list is accurate as of: 12/27/14  8:58 PM.  Always use your most recent med list.               amLODipine-benazepril 5-40 MG capsule  Commonly known as:  LOTREL  TAKE ONE CAPSULE BY MOUTH ONCE DAILY     CALCIUM 600 1500 (600 CA) MG Tabs tablet  Generic drug:  calcium  carbonate  Take 2 tablets by mouth daily.     cyanocobalamin 100 MCG tablet  Take 100 mcg by mouth daily.     EQ FIBER SUPPLEMENT PO  Take by mouth.     fluticasone 50 MCG/ACT nasal spray  Commonly known as:  FLONASE  Place 2 sprays into both nostrils daily.     furosemide 40 MG tablet  Commonly known as:  LASIX  Take 2 tablets (80 mg total) by mouth daily.     glucosamine-chondroitin 500-400 MG tablet  Take 1 tablet by mouth daily.     glucose blood test strip  Commonly known as:  ONETOUCH VERIO  Please check blood sugars at least half the time about 2 hours after any meal and 3 times per week on waking up. Dx code E11.9     insulin aspart 100 UNIT/ML FlexPen  Commonly known as:  NOVOLOG FLEXPEN  Inject 14 units before each meal     Insulin Glargine 100 UNIT/ML Solostar Pen  Commonly known as:  LANTUS SOLOSTAR  INJECT 55 UNITS INTO THE SKIN AT BEDTIME     insulin lispro 100 UNIT/ML injection  Commonly known as:  HUMALOG  Use max 78 units daily with V-Go pump     Insulin Pen Needle 31G X 8 MM Misc  Commonly known as:  B-D ULTRAFINE III SHORT PEN  Use to inject Lantus and novolog 4 times per day     levothyroxine 137 MCG tablet  Commonly known as:  SYNTHROID, LEVOTHROID  TAKE ONE TABLET BY MOUTH ONCE DAILY     LINZESS 290 MCG Caps capsule  Generic drug:  Linaclotide  Take 290 mcg by mouth daily.     metFORMIN 1000 MG tablet  Commonly known as:  GLUCOPHAGE  Take 1 tablet (1,000 mg total) by mouth 2 (two) times daily with a meal.     ONETOUCH DELICA LANCETS FINE Misc  Please check blood sugars at least half the time about 2 hours after any meal and 3 times per week on waking up. Dx code E11.9     potassium chloride 10 MEQ tablet  Commonly known as:  K-DUR  Take 1 tablet (10 mEq total) by mouth 2 (two) times daily.     PROBIOTIC DAILY PO  Take by mouth.     simvastatin 20 MG tablet  Commonly known as:  ZOCOR  TAKE ONE TABLET BY MOUTH AT BEDTIME  traMADol 50 MG tablet  Commonly known as:  ULTRAM  Take 50 mg by mouth every 6 (six) hours as needed.     V-GO 40 Kit  Use one per day        Allergies:  Allergies  Allergen Reactions  . Contrast Media [Iodinated Diagnostic Agents]     Throat swells from ct con tra  . Adhesive [Tape]     No paper tape, causes blisters  . Erythromycin     REACTION: rash  . Penicillins     REACTION: rash    Past Medical History  Diagnosis Date  . Hypertension   . Thyroid disease   . Diabetes mellitus   . CHF (congestive heart failure)   . PONV (postoperative nausea and vomiting)   . Hypothyroidism   . Pneumonia     hx of  . Bronchitis     hx of  . GERD (gastroesophageal reflux disease)   . Arthritis   . Hypercholesteremia     Past Surgical History  Procedure Laterality Date  . Knee arhtroscopy    . Carpel tunnel    . Foot surgery    . Abdominal hysterectomy      partial  . Tonsillectomy    . Breast surgery      lumpectomy left breast  . Colonoscopy w/ polypectomy    . Shoulder arthroscopy with subacromial decompression, rotator cuff repair and bicep tendon repair Right 05/26/2012    Procedure: Right Shoulder Diagnostic Operative Arthroscopy, Subacromial Decompression, Biceps Tenodesis, Mini Open Rotator Cuff Repair;  Surgeon: [name], MD;  Location: [place];  Service: Orthopedics;  Laterality: Right;  Right Shoulder Diagnostic Operative Arthroscopy, Subacromial Decompression, Biceps Tenodesis, Mini Open Rotator Cuff Repair    Family History  Problem Relation Age of Onset  . Adopted: Yes  . Coronary artery disease    . Diabetes Mother   . Heart disease Mother   . Hyperlipidemia Mother     Social History:  reports that she quit smoking about 24 years ago. She has never used smokeless tobacco. She reports that she does not drink alcohol or use illicit drugs.    Review of Systems   Most recent eye exam was in 6/15       Lipids: followed by PCP. She has  inadequate control with simvastatin, apparently taking only 20 mg and her last LDL was still high in 2/16       Lab Results  Component Value Date   CHOL 217* 05/26/2014   HDL 56.20 05/26/2014   LDLCALC 136* 05/26/2014   LDLDIRECT 129.5 02/06/2012   TRIG 124.0 05/26/2014   CHOLHDL 4 05/26/2014                  Thyroid:    She thinks she has been hypothyroid for about 40 years and is taking a supplement of 137 g Levothroid with consistent control   Lab Results  Component Value Date   TSH 0.42 11/11/2014   TSH 0.52 05/26/2014   TSH 0.40 01/07/2014   FREET4 1.03 06/28/2010   FREET4 1.36 11/27/2007        The blood pressure has been difficult to control.  Currently taking Lotrel 5/40 And followed by PCP  EDEMA: She thinks her swelling may be somewhat more.  No shortness of breath. She is taking 40 mg Lasix daily but usually doing this at night Also on potassium supplements with her Lasix    Physical Examination:  BP 142/88 mmHg  Pulse 79  Temp(Src) 98.4 F (36.9 C) (Oral)  Resp 16  Ht $R'5\' 5"'rI$  (1.651 m)  Wt 240 lb 3.2 oz (108.954 kg)  BMI 39.97 kg/m2  SpO2 96%     2+ pedal edema present Lungs clear    ASSESSMENT/PLAN:  Diabetes type 2, uncontrolled with BMI about 40  See history of present illness for detailed discussion of  current management, blood sugar patterns and problems identified  She had improved control with the V-go pump and A1c was 7.5 However her control is still reasonably fairly good with doing Novolog and Lantus. This may be because of better compliance with mealtime insulin since she has been off the pump She is however not checking enough readings after meals and currently has only a few readings which are fairly good recently; not clear if her doses of Novolog are appropriate especially at breakfast and lunch since she does not check readings after meals consistently She can probably do better on diet and exercise because of her recent weight gain,  not clear if this is from higher doses of insulin compared to the pump Fasting readings are reasonably with reduced fluctuation  She was told to  adjust the dose based on meal size Also needs to check more blood sugars after meals  HYPERTENSION: Poorly controlled  EDEMA: Etiology unclear.  For now will have her take additional Lasix for 3 days She was advised to see the dietitian for meal planning and improving her weight in sodium intake but she refused to do so because of cost. However given a a 4 g sodium diet today to follow and discussed principles of sodium restriction  Hyperlipidemia: Consider changing simvastatin to Lipitor or Crestor since dosage limited by interaction with amlodipine  Patient Instructions  Check blood sugars on waking up ..  .. times a week Also check blood sugars about 2 hours after a meal and do this after different meals by rotation Recommended blood sugar levels on waking up is 90-130 and about 2 hours after meal is 140-180 Please bring blood sugar monitor to each visit.  Extra fluid pill for 3 days    Counseling time on subjects discussed above is over 50% of today's 25 minute visit  Eric Morganti 12/27/2014, 8:58 PM   Note: This office note was prepared with Estate agent. Any transcriptional errors that result from this process are unintentional.

## 2015-01-16 ENCOUNTER — Ambulatory Visit (INDEPENDENT_AMBULATORY_CARE_PROVIDER_SITE_OTHER): Payer: Medicare Other | Admitting: Family Medicine

## 2015-01-16 ENCOUNTER — Ambulatory Visit (INDEPENDENT_AMBULATORY_CARE_PROVIDER_SITE_OTHER): Payer: Medicare Other

## 2015-01-16 ENCOUNTER — Ambulatory Visit: Payer: Medicare Other | Admitting: Internal Medicine

## 2015-01-16 VITALS — BP 158/80 | HR 80 | Temp 98.2°F | Resp 18 | Ht 64.5 in | Wt 238.4 lb

## 2015-01-16 DIAGNOSIS — M25551 Pain in right hip: Secondary | ICD-10-CM

## 2015-01-16 DIAGNOSIS — S76011A Strain of muscle, fascia and tendon of right hip, initial encounter: Secondary | ICD-10-CM

## 2015-01-16 DIAGNOSIS — S76311A Strain of muscle, fascia and tendon of the posterior muscle group at thigh level, right thigh, initial encounter: Secondary | ICD-10-CM | POA: Diagnosis not present

## 2015-01-16 MED ORDER — MELOXICAM 7.5 MG PO TABS: 7.5000 mg | ORAL_TABLET | Freq: Every day | ORAL | Status: DC

## 2015-01-16 MED ORDER — TRAMADOL HCL 50 MG PO TABS: 50.0000 mg | ORAL_TABLET | Freq: Two times a day (BID) | ORAL | Status: DC | PRN

## 2015-01-16 NOTE — Progress Notes (Signed)
[facility] [address] [place] [address] [phone]  Date:  01/16/2015   Name:  Marissa Roberts   DOB:  [birth year]   MRN:  [id number]  PCP:  [name], MD    Chief Complaint: Hip Pain   History of Present Illness:  This is a 69 y.o. female with PMH IDDM2, HTN, HLD, hypothyroidism, obesity, osteopenia who is presenting with right buttock pain x 5 days. Happened when she was in a parking lot. She stepped up on a curb and her hip went forward but her foot stayed in same place. She states she instantly got pain in her right buttock. She states she felt like her hip "went out of socket". Since then she has pain in her buttock, esp when sitting. She feels like she is "sitting on something". She is starting to get pain with ambulating. She has been trying ibuprofen 400 mg QID and intermittent tylenol and no help. Not getting better. She has never had problems like this before. She denies shooting pain into her back or leg. No paresthesias. She wants an xray of her hip "for peace of mind".  Review of Systems:  Review of Systems See HPI  Patient Active Problem List   Diagnosis Date Noted  . Headache 11/18/2014  . Obesity (BMI 30-39.9) 04/19/2013  . Hyperlipidemia 07/28/2009  . OSTEOPENIA 03/14/2009  . MYALGIA 01/12/2009  . Diabetes mellitus type 2, uncontrolled, with complications ([place]) 74/25/9563  . Hypothyroidism 09/24/2006  . Essential hypertension 09/24/2006    Prior to Admission medications   Medication Sig Start Date End Date Taking? Authorizing Provider  amLODipine-benazepril (LOTREL) 5-40 MG per capsule TAKE ONE CAPSULE BY MOUTH ONCE DAILY 12/15/14  Yes [name], MD  Calcium Carbonate (CALCIUM 600) 1500 MG TABS Take 2 tablets by mouth daily.    Yes Historical Provider, MD  cyanocobalamin 100 MCG tablet Take 100 mcg by mouth daily.   Yes Historical Provider, MD  EQ FIBER SUPPLEMENT PO Take by mouth.   Yes Historical Provider,  MD  fluticasone (FLONASE) 50 MCG/ACT nasal spray Place 2 sprays into both nostrils daily. 07/15/14  Yes [name], MD  furosemide (LASIX) 40 MG tablet Take 2 tablets (80 mg total) by mouth daily. 01/07/14  Yes Kennyth Arnold, FNP  glucosamine-chondroitin 500-400 MG tablet Take 1 tablet by mouth daily.     Yes Historical Provider, MD  glucose blood (ONETOUCH VERIO) test strip Please check blood sugars at least half the time about 2 hours after any meal and 3 times per week on waking up. Dx code E11.9 06/09/14  Yes Elayne Snare, MD  insulin aspart (NOVOLOG FLEXPEN) 100 UNIT/ML FlexPen Inject 14 units before each meal 11/23/14  Yes Elayne Snare, MD  Insulin Glargine (LANTUS SOLOSTAR) 100 UNIT/ML Solostar Pen INJECT 55 UNITS INTO THE SKIN AT BEDTIME Patient taking differently: Inject 55 Units into the skin. INJECT 55 UNITS INTO THE SKIN IN THE MORNING 11/14/14  Yes Elayne Snare, MD  Insulin Pen Needle (B-D ULTRAFINE III SHORT PEN) 31G X 8 MM MISC Use to inject Lantus and novolog 4 times per day 11/23/14  Yes Elayne Snare, MD  levothyroxine (SYNTHROID, LEVOTHROID) 137 MCG tablet TAKE ONE TABLET BY MOUTH ONCE DAILY 12/14/14  Yes [name], MD  LINZESS 290 MCG CAPS capsule Take 290 mcg by mouth daily.  05/17/14  Yes Historical Provider, MD  metFORMIN (GLUCOPHAGE) 1000 MG tablet Take 1 tablet (1,000 mg total) by  mouth 2 (two) times daily with a meal. 08/12/14  Yes [name], MD  [facility] DELICA LANCETS FINE MISC Please check blood sugars at least half the time about 2 hours after any meal and 3 times per week on waking up. Dx code E11.9 06/09/14  Yes Elayne Snare, MD  potassium chloride (K-DUR) 10 MEQ tablet Take 1 tablet (10 mEq total) by mouth 2 (two) times daily. 05/19/14  Yes Kennyth Arnold, FNP  Probiotic Product (PROBIOTIC DAILY PO) Take by mouth.   Yes Historical Provider, MD  simvastatin (ZOCOR) 20 MG tablet TAKE ONE TABLET BY MOUTH AT BEDTIME 06/23/14  Yes Kennyth Arnold, FNP       Elayne Snare, MD       Elayne Snare, MD       Historical Provider, MD    Allergies  Allergen Reactions  . Contrast Media [Iodinated Diagnostic Agents]     Throat swells from ct con tra  . Adhesive [Tape]     No paper tape, causes blisters  . Erythromycin     REACTION: rash  . Penicillins     REACTION: rash    Past Surgical History  Procedure Laterality Date  . Knee arhtroscopy    . Carpel tunnel    . Foot surgery    . Abdominal hysterectomy      partial  . Tonsillectomy    . Breast surgery      lumpectomy left breast  . Colonoscopy w/ polypectomy    . Shoulder arthroscopy with subacromial decompression, rotator cuff repair and bicep tendon repair Right 05/26/2012    Procedure: Right Shoulder Diagnostic Operative Arthroscopy, Subacromial Decompression, Biceps Tenodesis, Mini Open Rotator Cuff Repair;  Surgeon: [name], MD;  Location: [place];  Service: Orthopedics;  Laterality: Right;  Right Shoulder Diagnostic Operative Arthroscopy, Subacromial Decompression, Biceps Tenodesis, Mini Open Rotator Cuff Repair    Social History  Substance Use Topics  . Smoking status: Former Smoker    Quit date: 04/01/1990  . Smokeless tobacco: Never Used  . Alcohol Use: No    Family History  Problem Relation Age of Onset  . Adopted: Yes  . Coronary artery disease    . Diabetes Mother   . Heart disease Mother   . Hyperlipidemia Mother     Medication list has been reviewed and updated.  Physical Examination:  Physical Exam  Constitutional: She is oriented to person, place, and time. She appears well-developed and well-nourished. No distress.  HENT:  Head: Normocephalic and atraumatic.  Right Ear: Hearing normal.  Left Ear: Hearing normal.  Nose: Nose normal.  Eyes: Conjunctivae and lids are normal. Right eye exhibits no discharge. Left eye exhibits no discharge. No scleral icterus.  Cardiovascular: Normal rate, regular rhythm and normal pulses.   Pulmonary/Chest: Effort normal  and breath sounds normal. No respiratory distress.  Musculoskeletal: Normal range of motion.       Right hip: She exhibits normal range of motion, normal strength and no tenderness.       Left hip: She exhibits normal range of motion, normal strength and no tenderness.       Legs: Right buttock tenderness. Pain at buttock with flexion on right hip. No pain with hip IR and ER. No anterior hip tenderness. Mild tenderness over greater trochanter.  Neurological: She is alert and oriented to person, place, and time. She has normal strength. No sensory deficit. Gait (antalgic) abnormal.  Skin: Skin is warm, dry and intact. No lesion  and no rash noted.  Psychiatric: She has a normal mood and affect. Her speech is normal and behavior is normal. Thought content normal.   BP 158/80 mmHg  Pulse 80  Temp(Src) 98.2 F (36.8 C) (Oral)  Resp 18  Ht 5' 4.5" (1.638 m)  Wt 238 lb 6.4 oz (108.138 kg)  BMI 40.30 kg/m2  SpO2 98%  [facility] reading (PRIMARY) by  Dr. Lorelei Pont: negative.  Assessment and Plan:  1. Hip pain, right 2. Muscle strain of gluteal region, right, initial encounter Radiograph negative. Likely a gluteal strain. Counseled on heat and massage. Tramadol and mobic for pain. Return in 7-10 days if symptoms not improving. - DG HIP UNILAT W OR W/O PELVIS 2-3 VIEWS RIGHT; Future - traMADol (ULTRAM) 50 MG tablet; Take 1 tablet (50 mg total) by mouth every 12 (twelve) hours as needed.  Dispense: 30 tablet; Refill: 0 - meloxicam (MOBIC) 7.5 MG tablet; Take 1 tablet (7.5 mg total) by mouth daily.  Dispense: 30 tablet; Refill: 0   Benjaman Pott. Drenda Freeze, MHS [facility]  01/16/2015

## 2015-01-16 NOTE — Patient Instructions (Signed)
Take tramadol twice a day as needed for pain. Take mobic once a day. Do not use mobic with other products containing ibuprofen, naprosyn or aspirin. You may use tylenol with this medication. Apply heat. Gentle massage can help. Return in 10-14 days if symptoms not improving.

## 2015-01-23 ENCOUNTER — Telehealth: Payer: Self-pay | Admitting: Internal Medicine

## 2015-01-23 MED ORDER — FUROSEMIDE 40 MG PO TABS: 80.0000 mg | ORAL_TABLET | Freq: Every day | ORAL | Status: DC

## 2015-01-23 NOTE — Telephone Encounter (Signed)
Patient is needing refill for furosemide (LASIX) 40 MG tablet [[id number] Pharmacy is [facility]

## 2015-01-23 NOTE — Telephone Encounter (Signed)
RF request for furosemide LOV: 11/16/14 Next ov: 05/22/15 Last written: 01/07/14 #360 w/ 1RF  Rx sent. Left message for pt to call back.

## 2015-01-27 ENCOUNTER — Ambulatory Visit (INDEPENDENT_AMBULATORY_CARE_PROVIDER_SITE_OTHER): Payer: Medicare Other

## 2015-01-27 DIAGNOSIS — Z23 Encounter for immunization: Secondary | ICD-10-CM

## 2015-02-07 ENCOUNTER — Encounter: Payer: Self-pay | Admitting: Internal Medicine

## 2015-02-07 ENCOUNTER — Ambulatory Visit: Payer: Medicare Other | Admitting: Internal Medicine

## 2015-02-07 VITALS — BP 160/92 | HR 71 | Temp 97.8°F | Resp 16 | Ht 65.0 in | Wt 239.1 lb

## 2015-02-07 DIAGNOSIS — I10 Essential (primary) hypertension: Secondary | ICD-10-CM

## 2015-02-07 DIAGNOSIS — E118 Type 2 diabetes mellitus with unspecified complications: Secondary | ICD-10-CM

## 2015-02-07 DIAGNOSIS — Z0001 Encounter for general adult medical examination with abnormal findings: Secondary | ICD-10-CM | POA: Insufficient documentation

## 2015-02-07 DIAGNOSIS — E1165 Type 2 diabetes mellitus with hyperglycemia: Secondary | ICD-10-CM

## 2015-02-07 DIAGNOSIS — Z794 Long term (current) use of insulin: Secondary | ICD-10-CM

## 2015-02-07 DIAGNOSIS — M8580 Other specified disorders of bone density and structure, unspecified site: Secondary | ICD-10-CM

## 2015-02-07 DIAGNOSIS — E039 Hypothyroidism, unspecified: Secondary | ICD-10-CM

## 2015-02-07 DIAGNOSIS — IMO0002 Reserved for concepts with insufficient information to code with codable children: Secondary | ICD-10-CM

## 2015-02-07 DIAGNOSIS — E119 Type 2 diabetes mellitus without complications: Secondary | ICD-10-CM

## 2015-02-07 DIAGNOSIS — Z0000 Encounter for general adult medical examination without abnormal findings: Secondary | ICD-10-CM

## 2015-02-07 MED ORDER — SIMVASTATIN 20 MG PO TABS: 20.0000 mg | ORAL_TABLET | Freq: Every day | ORAL | Status: DC

## 2015-02-07 MED ORDER — AMLODIPINE BESY-BENAZEPRIL HCL 10-40 MG PO CAPS: 1.0000 | ORAL_CAPSULE | Freq: Every day | ORAL | Status: DC

## 2015-02-07 NOTE — Assessment & Plan Note (Signed)
Recent TSH controlled and no dose adjustment today synthroid 137 mcg daily. Sees endo.

## 2015-02-07 NOTE — Progress Notes (Signed)
   Subjective:    Patient ID: Marissa Roberts, female    DOB: October 11, 1945, 69 y.o.   MRN: [id number]  HPI  Here for medicare wellness. Please see A/P for status and treatment of chronic medical problems.   HPI #2: Having a lot of pain in her right hip and upper leg after injury to it beginning of October. She is taking meloxicam for the pain and some tramadol rarely. She has been using heating pad on it and that is helping some. Overall improving but not better and she is worried about it. Has not gone to see her orthopedic doctor about it. No fevers or chills. No low back pain. No numbness in her leg. Pain with walking, and moving especially. Feels tight after sitting for some time or waking.   Diet: DM if diabetic Physical activity: sedentary Depression/mood screen: negative Hearing: intact to whispered voice Visual acuity: grossly normal, performs annual eye exam  ADLs: capable Fall risk: low risk Home safety: good Cognitive evaluation: intact to orientation, naming, recall and repetition EOL planning: adv directives discussed, in place  I have personally reviewed and have noted 1. The patient's medical and social history - reviewed today no changes 2. Their use of alcohol, tobacco or illicit drugs 3. Their current medications and supplements 4. The patient's functional ability including ADL's, fall risks, home safety risks and hearing or visual impairment. 5. Diet and physical activities 6. Evidence for depression or mood disorders 7. Care team reviewed and updated (available in snapshot)  Review of Systems  Constitutional: Negative for fever, activity change, appetite change, fatigue and unexpected weight change.  HENT: Negative for ear discharge, ear pain, postnasal drip and trouble swallowing.   Eyes: Negative.   Respiratory: Negative for cough, chest tightness, shortness of breath and wheezing.   Cardiovascular: Negative.   Gastrointestinal: Negative.   Musculoskeletal:  Positive for myalgias and arthralgias. Negative for back pain, joint swelling and gait problem.  Skin: Negative.   Neurological: Negative for dizziness, weakness, numbness and headaches.  Psychiatric/Behavioral: Negative.       Objective:   Physical Exam  Constitutional: She is oriented to person, place, and time. She appears well-developed and well-nourished.  Overweight  HENT:  Head: Normocephalic and atraumatic.  Mouth/Throat: Oropharynx is clear and moist.  Eyes: EOM are normal.  Neck: Normal range of motion.  Cardiovascular: Normal rate.   Pulmonary/Chest: Effort normal. No respiratory distress. She has no wheezes. She has no rales.  Abdominal: Soft.  Musculoskeletal: She exhibits no edema.  Neurological: She is alert and oriented to person, place, and time. Coordination normal.  Skin: Skin is warm and dry.   Filed Vitals:   02/07/15 0859  BP: 162/90  Pulse: 71  Temp: 97.8 F (36.6 C)  TempSrc: Oral  Resp: 16  Height: 5\' 5"  (1.651 m)  Weight: 239 lb 1.9 oz (108.464 kg)  SpO2: 98%      Assessment & Plan:

## 2015-02-07 NOTE — Patient Instructions (Signed)
We will have you go back to the orthopedic doctor to see if the muscles are bruised or pulled.   Keep using the meloxicam (mobic) daily until you see them.   We will increase the dose of the blood pressure medicine so when you get the refill it will be changed.   Call [employer] to see if they will pay for the shingles shot if you want to get it.  Health Maintenance, Female Adopting a healthy lifestyle and getting preventive care can go a long way to promote health and wellness. Talk with your health care provider about what schedule of regular examinations is right for you. This is a good chance for you to check in with your provider about disease prevention and staying healthy. In between checkups, there are plenty of things you can do on your own. Experts have done a lot of research about which lifestyle changes and preventive measures are most likely to keep you healthy. Ask your health care provider for more information. WEIGHT AND DIET  Eat a healthy diet  Be sure to include plenty of vegetables, fruits, low-fat dairy products, and lean protein.  Do not eat a lot of foods high in solid fats, added sugars, or salt.  Get regular exercise. This is one of the most important things you can do for your health.  Most adults should exercise for at least 150 minutes each week. The exercise should increase your heart rate and make you sweat (moderate-intensity exercise).  Most adults should also do strengthening exercises at least twice a week. This is in addition to the moderate-intensity exercise.  Maintain a healthy weight  Body mass index (BMI) is a measurement that can be used to identify possible weight problems. It estimates body fat based on height and weight. Your health care provider can help determine your BMI and help you achieve or maintain a healthy weight.  For females 41 years of age and older:   A BMI below 18.5 is considered underweight.  A BMI of 18.5 to 24.9 is  normal.  A BMI of 25 to 29.9 is considered overweight.  A BMI of 30 and above is considered obese.  Watch levels of cholesterol and blood lipids  You should start having your blood tested for lipids and cholesterol at 67 years of age, then have this test every 5 years.  You may need to have your cholesterol levels checked more often if:  Your lipid or cholesterol levels are high.  You are older than 67 years of age.  You are at high risk for heart disease.  CANCER SCREENING   Lung Cancer  Lung cancer screening is recommended for adults 59-64 years old who are at high risk for lung cancer because of a history of smoking.  A yearly low-dose CT scan of the lungs is recommended for people who:  Currently smoke.  Have quit within the past 15 years.  Have at least a 30-pack-year history of smoking. A pack year is smoking an average of one pack of cigarettes a day for 1 year.  Yearly screening should continue until it has been 15 years since you quit.  Yearly screening should stop if you develop a health problem that would prevent you from having lung cancer treatment.  Breast Cancer  Practice breast self-awareness. This means understanding how your breasts normally appear and feel.  It also means doing regular breast self-exams. Let your health care provider know about any changes, no matter how small.  If you are in your 20s or 30s, you should have a clinical breast exam (CBE) by a health care provider every 1-3 years as part of a regular health exam.  If you are 70 or older, have a CBE every year. Also consider having a breast X-ray (mammogram) every year.  If you have a family history of breast cancer, talk to your health care provider about genetic screening.  If you are at high risk for breast cancer, talk to your health care provider about having an MRI and a mammogram every year.  Breast cancer gene (BRCA) assessment is recommended for women who have family members  with BRCA-related cancers. BRCA-related cancers include:  Breast.  Ovarian.  Tubal.  Peritoneal cancers.  Results of the assessment will determine the need for genetic counseling and BRCA1 and BRCA2 testing. Cervical Cancer Your health care provider may recommend that you be screened regularly for cancer of the pelvic organs (ovaries, uterus, and vagina). This screening involves a pelvic examination, including checking for microscopic changes to the surface of your cervix (Pap test). You may be encouraged to have this screening done every 3 years, beginning at age 73.  For women ages 76-65, health care providers may recommend pelvic exams and Pap testing every 3 years, or they may recommend the Pap and pelvic exam, combined with testing for human papilloma virus (HPV), every 5 years. Some types of HPV increase your risk of cervical cancer. Testing for HPV may also be done on women of any age with unclear Pap test results.  Other health care providers may not recommend any screening for nonpregnant women who are considered low risk for pelvic cancer and who do not have symptoms. Ask your health care provider if a screening pelvic exam is right for you.  If you have had past treatment for cervical cancer or a condition that could lead to cancer, you need Pap tests and screening for cancer for at least 20 years after your treatment. If Pap tests have been discontinued, your risk factors (such as having a new sexual partner) need to be reassessed to determine if screening should resume. Some women have medical problems that increase the chance of getting cervical cancer. In these cases, your health care provider may recommend more frequent screening and Pap tests. Colorectal Cancer  This type of cancer can be detected and often prevented.  Routine colorectal cancer screening usually begins at 67 years of age and continues through 67 years of age.  Your health care provider may recommend  screening at an earlier age if you have risk factors for colon cancer.  Your health care provider may also recommend using home test kits to check for hidden blood in the stool.  A small camera at the end of a tube can be used to examine your colon directly (sigmoidoscopy or colonoscopy). This is done to check for the earliest forms of colorectal cancer.  Routine screening usually begins at age 52.  Direct examination of the colon should be repeated every 5-10 years through 68 years of age. However, you may need to be screened more often if early forms of precancerous polyps or small growths are found. Skin Cancer  Check your skin from head to toe regularly.  Tell your health care provider about any new moles or changes in moles, especially if there is a change in a mole's shape or color.  Also tell your health care provider if you have a mole that is larger than the  size of a pencil eraser.  Always use sunscreen. Apply sunscreen liberally and repeatedly throughout the day.  Protect yourself by wearing long sleeves, pants, a wide-brimmed hat, and sunglasses whenever you are outside. HEART DISEASE, DIABETES, AND HIGH BLOOD PRESSURE   High blood pressure causes heart disease and increases the risk of stroke. High blood pressure is more likely to develop in:  People who have blood pressure in the high end of the normal range (130-139/85-89 mm Hg).  People who are overweight or obese.  People who are African American.  If you are 26-24 years of age, have your blood pressure checked every 3-5 years. If you are 32 years of age or older, have your blood pressure checked every year. You should have your blood pressure measured twice--once when you are at a hospital or clinic, and once when you are not at a hospital or clinic. Record the average of the two measurements. To check your blood pressure when you are not at a hospital or clinic, you can use:  An automated blood pressure machine at a  pharmacy.  A home blood pressure monitor.  If you are between 18 years and 63 years old, ask your health care provider if you should take aspirin to prevent strokes.  Have regular diabetes screenings. This involves taking a blood sample to check your fasting blood sugar level.  If you are at a normal weight and have a low risk for diabetes, have this test once every three years after 67 years of age.  If you are overweight and have a high risk for diabetes, consider being tested at a younger age or more often. PREVENTING INFECTION  Hepatitis B  If you have a higher risk for hepatitis B, you should be screened for this virus. You are considered at high risk for hepatitis B if:  You were born in a country where hepatitis B is common. Ask your health care provider which countries are considered high risk.  Your parents were born in a high-risk country, and you have not been immunized against hepatitis B (hepatitis B vaccine).  You have HIV or AIDS.  You use needles to inject street drugs.  You live with someone who has hepatitis B.  You have had sex with someone who has hepatitis B.  You get hemodialysis treatment.  You take certain medicines for conditions, including cancer, organ transplantation, and autoimmune conditions. Hepatitis C  Blood testing is recommended for:  Everyone born from [birth year] through 1963.  Anyone with known risk factors for hepatitis C. Sexually transmitted infections (STIs)  You should be screened for sexually transmitted infections (STIs) including gonorrhea and chlamydia if:  You are sexually active and are younger than 67 years of age.  You are older than 67 years of age and your health care provider tells you that you are at risk for this type of infection.  Your sexual activity has changed since you were last screened and you are at an increased risk for chlamydia or gonorrhea. Ask your health care provider if you are at risk.  If you do not  have HIV, but are at risk, it may be recommended that you take a prescription medicine daily to prevent HIV infection. This is called pre-exposure prophylaxis (PrEP). You are considered at risk if:  You are sexually active and do not regularly use condoms or know the HIV status of your partner(s).  You take drugs by injection.  You are sexually active with a partner who  has HIV. Talk with your health care provider about whether you are at high risk of being infected with HIV. If you choose to begin PrEP, you should first be tested for HIV. You should then be tested every 3 months for as long as you are taking PrEP.  PREGNANCY   If you are premenopausal and you may become pregnant, ask your health care provider about preconception counseling.  If you may become pregnant, take 400 to 800 micrograms (mcg) of folic acid every day.  If you want to prevent pregnancy, talk to your health care provider about birth control (contraception). OSTEOPOROSIS AND MENOPAUSE   Osteoporosis is a disease in which the bones lose minerals and strength with aging. This can result in serious bone fractures. Your risk for osteoporosis can be identified using a bone density scan.  If you are 20 years of age or older, or if you are at risk for osteoporosis and fractures, ask your health care provider if you should be screened.  Ask your health care provider whether you should take a calcium or vitamin D supplement to lower your risk for osteoporosis.  Menopause may have certain physical symptoms and risks.  Hormone replacement therapy may reduce some of these symptoms and risks. Talk to your health care provider about whether hormone replacement therapy is right for you.  HOME CARE INSTRUCTIONS   Schedule regular health, dental, and eye exams.  Stay current with your immunizations.   Do not use any tobacco products including cigarettes, chewing tobacco, or electronic cigarettes.  If you are pregnant, do not  drink alcohol.  If you are breastfeeding, limit how much and how often you drink alcohol.  Limit alcohol intake to no more than 1 drink per day for nonpregnant women. One drink equals 12 ounces of beer, 5 ounces of wine, or 1 ounces of hard liquor.  Do not use street drugs.  Do not share needles.  Ask your health care provider for help if you need support or information about quitting drugs.  Tell your health care provider if you often feel depressed.  Tell your health care provider if you have ever been abused or do not feel safe at home.   This information is not intended to replace advice given to you by your health care provider. Make sure you discuss any questions you have with your health care provider.   Document Released: 10/01/2010 Document Revised: 04/08/2014 Document Reviewed: 02/17/2013 Elsevier Interactive Patient Education [employer].

## 2015-02-07 NOTE — Assessment & Plan Note (Signed)
Recent labs reviewed and none needed today, immunizations up to date and declines shingles today. Counseled her about need for exercise and weight loss. Colonoscopy and dexa up to date. 10 year screening recommendations given to patient at visit.

## 2015-02-07 NOTE — Assessment & Plan Note (Signed)
DEXA due next year.

## 2015-02-07 NOTE — Assessment & Plan Note (Addendum)
Increasing her amlodipine to 10 mg and keep benazepril 40 mg daily and her lasix daily. Recent labs okay and no indication for change. BP mildly elevated last several visit. Continue lasix as well.

## 2015-02-07 NOTE — Assessment & Plan Note (Signed)
Seeing endo for dose adjustment. Foot exam done today.

## 2015-03-17 ENCOUNTER — Other Ambulatory Visit: Payer: Self-pay | Admitting: *Deleted

## 2015-03-17 ENCOUNTER — Telehealth: Payer: Self-pay | Admitting: Endocrinology

## 2015-03-17 MED ORDER — METFORMIN HCL 1000 MG PO TABS: 1000.0000 mg | ORAL_TABLET | Freq: Two times a day (BID) | ORAL | Status: DC

## 2015-03-17 NOTE — Telephone Encounter (Signed)
rx sent

## 2015-03-17 NOTE — Telephone Encounter (Signed)
Please call the metformin into [facility] 90 day supply 1 tab twice a day

## 2015-03-22 ENCOUNTER — Other Ambulatory Visit: Payer: Self-pay

## 2015-03-22 ENCOUNTER — Other Ambulatory Visit (INDEPENDENT_AMBULATORY_CARE_PROVIDER_SITE_OTHER): Payer: Medicare Other

## 2015-03-22 DIAGNOSIS — E1165 Type 2 diabetes mellitus with hyperglycemia: Secondary | ICD-10-CM

## 2015-03-22 DIAGNOSIS — E039 Hypothyroidism, unspecified: Secondary | ICD-10-CM | POA: Insufficient documentation

## 2015-03-22 DIAGNOSIS — IMO0002 Reserved for concepts with insufficient information to code with codable children: Secondary | ICD-10-CM

## 2015-03-22 LAB — COMPREHENSIVE METABOLIC PANEL
ALBUMIN: 3.8 g/dL (ref 3.5–5.2)
ALT: 17 U/L (ref 0–35)
AST: 18 U/L (ref 0–37)
Alkaline Phosphatase: 59 U/L (ref 39–117)
BUN: 13 mg/dL (ref 6–23)
CHLORIDE: 105 meq/L (ref 96–112)
CO2: 27 mEq/L (ref 19–32)
CREATININE: 0.76 mg/dL (ref 0.40–1.20)
Calcium: 9.8 mg/dL (ref 8.4–10.5)
GFR: 97.45 mL/min (ref >60.00)
Glucose, Bld: 112 mg/dL — ABNORMAL HIGH (ref 70–99)
Potassium: 3.9 mEq/L (ref 3.5–5.1)
SODIUM: 141 meq/L (ref 135–145)
Total Bilirubin: 0.3 mg/dL (ref 0.2–1.2)
Total Protein: 6.9 g/dL (ref 6.0–8.3)

## 2015-03-22 LAB — LIPID PANEL
CHOL/HDL RATIO: 4
Cholesterol: 197 mg/dL (ref 0–200)
HDL: 52.1 mg/dL (ref >39.00)
LDL CALC: 121 mg/dL — AB (ref 0–99)
NonHDL: 144.86
TRIGLYCERIDES: 120 mg/dL (ref 0.0–149.0)
VLDL: 24 mg/dL (ref 0.0–40.0)

## 2015-03-22 LAB — MICROALBUMIN / CREATININE URINE RATIO
Creatinine,U: 117.7 mg/dL
MICROALB UR: 74.3 mg/dL — AB (ref 0.0–1.9)
Microalb Creat Ratio: 63.1 mg/g — ABNORMAL HIGH (ref 0.0–30.0)

## 2015-03-22 LAB — HEMOGLOBIN A1C: Hgb A1c MFr Bld: 7 % — ABNORMAL HIGH (ref 4.6–6.5)

## 2015-03-28 ENCOUNTER — Encounter: Payer: Self-pay | Admitting: Endocrinology

## 2015-03-28 ENCOUNTER — Ambulatory Visit (INDEPENDENT_AMBULATORY_CARE_PROVIDER_SITE_OTHER): Payer: Medicare Other | Admitting: Endocrinology

## 2015-03-28 VITALS — BP 134/82 | HR 88 | Temp 98.7°F | Resp 16 | Ht 65.0 in | Wt 242.2 lb

## 2015-03-28 DIAGNOSIS — E1169 Type 2 diabetes mellitus with other specified complication: Secondary | ICD-10-CM | POA: Diagnosis not present

## 2015-03-28 DIAGNOSIS — I10 Essential (primary) hypertension: Secondary | ICD-10-CM

## 2015-03-28 DIAGNOSIS — E1165 Type 2 diabetes mellitus with hyperglycemia: Secondary | ICD-10-CM | POA: Diagnosis not present

## 2015-03-28 DIAGNOSIS — E785 Hyperlipidemia, unspecified: Secondary | ICD-10-CM

## 2015-03-28 DIAGNOSIS — Z794 Long term (current) use of insulin: Secondary | ICD-10-CM

## 2015-03-28 DIAGNOSIS — R809 Proteinuria, unspecified: Secondary | ICD-10-CM | POA: Diagnosis not present

## 2015-03-28 NOTE — Progress Notes (Signed)
Patient ID: Marissa Roberts, female   DOB: 11/18/1945, 70 y.o.   MRN: [id number]           Reason for Appointment:  Follow-up for Type 2 Diabetes  Referring physician: Megan Salon  History of Present Illness:          Diagnosis: Type 2 diabetes mellitus, date of diagnosis: 2005       Past history:  She thinks her blood sugars were only mildly increased at borderline levels at the onsetper Most likely she was treated with metformin initially and this has been continued.   At some point she was also tried on [place] and [place] for improving her control. She is not sure why she was started on insulin 5 years ago, presumably for worsening hyperglycemia Also not clear if she has tried various insulin regimens before starting Lantus and NovoLog Her A1c has been usually over 7% and gradually increasing over the last year  Recent history:   INSULIN regimen is  Lantus 50 in am, Novolog 14 units ac tid usually  Her A1c had come down to 7.0, previously 7.5 Previously had tried the V-go pump but subsequently she stopped doing the V-go pump because of the cost, skin irritation and local discomfort She has been on Lantus and Novolog but now taking Lantus in the mornings  Current blood sugar patterns, management and problems identified:  Her blood sugars Are being checked only in the mornings despite reminders to check readings later in the day  Most of these appear to be fairly good with sporadic high readings  Although she thinks her sugars are higher when she is eating late in the evening she does not check her sugars after supper and not clear what her postprandial readings are  She is trying to be better compliant now with her basal bolus insulin regimen but still not taking her Novolog when she is eating outon Sundays and will take it after coming back  She is still not able to avoid sweet tea completely and is still drinking some  Not going to exercise recently and has not been  motivated to go back to the gym  No hypoglycemia recently  Oral hypoglycemic drugs the patient is taking are: Metformin 2 g daily    Side effects from medications have been:none  Compliance with the medical regimen: Fair  Glucose monitoring:  done 1-2 time a day         Glucometer:  One Touch.      Blood Glucose readings by time of day  Mean values apply above for all meters except median for One Touch  PRE-MEAL Fasting Lunch Dinner Bedtime Overall  Glucose range: 106-182  129      Mean/median: 130     13 5    POST-MEAL PC Breakfast PC Lunch PC Dinner  Glucose range:  155, 178     Mean/median:       Self-care: The diet that the patient has been following is: tries to limit sweets , generally has avoided drinks with sugar.     Meals: 3 meals per day. Breakfast is toast, bacon, egg; usually not a lot of carbohydrates, rarely fried food including fish.    Not always restricting fat intake      Exercise:  none now, previously up to 5 days  a week for about an hour at the gym in the mornings         Dietician visit, most recent:2014.  Weight history:  Wt Readings from Last 3 Encounters:  03/28/15 242 lb 3.2 oz (109.861 kg)  02/07/15 239 lb 1.9 oz (108.464 kg)  01/16/15 238 lb 6.4 oz (108.138 kg)    Glycemic control:   Lab Results  Component Value Date   HGBA1C 7.0* 03/22/2015   HGBA1C 7.5* 11/11/2014   HGBA1C 8.0* 05/26/2014   Lab Results  Component Value Date   MICROALBUR 74.3* 03/22/2015   LDLCALC 121* 03/22/2015   CREATININE 0.76 03/22/2015         Medication List       This list is accurate as of: 03/28/15  4:00 PM.  Always use your most recent med list.               amLODipine-benazepril 10-40 MG capsule  Commonly known as:  LOTREL  Take 1 capsule by mouth daily.     CALCIUM 600 1500 (600 Ca) MG Tabs tablet  Generic drug:  calcium carbonate  Take 2 tablets by mouth daily.     cyanocobalamin 100 MCG tablet  Take 100 mcg by mouth  daily.     EQ FIBER SUPPLEMENT PO  Take by mouth.     fluticasone 50 MCG/ACT nasal spray  Commonly known as:  FLONASE  Place 2 sprays into both nostrils daily.     furosemide 40 MG tablet  Commonly known as:  LASIX  Take 2 tablets (80 mg total) by mouth daily.     glucosamine-chondroitin 500-400 MG tablet  Take 1 tablet by mouth daily.     glucose blood test strip  Commonly known as:  ONETOUCH VERIO  Please check blood sugars at least half the time about 2 hours after any meal and 3 times per week on waking up. Dx code E11.9     insulin aspart 100 UNIT/ML FlexPen  Commonly known as:  NOVOLOG FLEXPEN  Inject 14 units before each meal     Insulin Glargine 100 UNIT/ML Solostar Pen  Commonly known as:  LANTUS SOLOSTAR  INJECT 55 UNITS INTO THE SKIN AT BEDTIME     Insulin Pen Needle 31G X 8 MM Misc  Commonly known as:  B-D ULTRAFINE III SHORT PEN  Use to inject Lantus and novolog 4 times per day     levothyroxine 137 MCG tablet  Commonly known as:  SYNTHROID, LEVOTHROID  TAKE ONE TABLET BY MOUTH ONCE DAILY     LINZESS 290 MCG Caps capsule  Generic drug:  Linaclotide  Take 290 mcg by mouth daily.     magnesium oxide 400 MG tablet  Commonly known as:  MAG-OX  Take 400 mg by mouth daily.     meloxicam 7.5 MG tablet  Commonly known as:  MOBIC  Take 1 tablet (7.5 mg total) by mouth daily.     metFORMIN 1000 MG tablet  Commonly known as:  GLUCOPHAGE  Take 1 tablet (1,000 mg total) by mouth 2 (two) times daily with a meal.     ONETOUCH DELICA LANCETS FINE Misc  Please check blood sugars at least half the time about 2 hours after any meal and 3 times per week on waking up. Dx code E11.9     potassium chloride 10 MEQ tablet  Commonly known as:  K-DUR  Take 1 tablet (10 mEq total) by mouth 2 (two) times daily.     PROBIOTIC DAILY PO  Take by mouth.     simvastatin 20 MG tablet  Commonly known as:  ZOCOR  Take 1  tablet (20 mg total) by mouth at bedtime.     traMADol  50 MG tablet  Commonly known as:  ULTRAM  Take 1 tablet (50 mg total) by mouth every 12 (twelve) hours as needed.        Allergies:  Allergies  Allergen Reactions  . Contrast Media [Iodinated Diagnostic Agents]     Throat swells from ct con tra  . Adhesive [Tape]     No paper tape, causes blisters  . Erythromycin     REACTION: rash  . Penicillins     REACTION: rash    Past Medical History  Diagnosis Date  . Hypertension   . Thyroid disease   . Diabetes mellitus   . CHF (congestive heart failure) ([place])   . PONV (postoperative nausea and vomiting)   . Hypothyroidism   . Pneumonia     hx of  . Bronchitis     hx of  . GERD (gastroesophageal reflux disease)   . Arthritis   . Hypercholesteremia     Past Surgical History  Procedure Laterality Date  . Knee arhtroscopy    . Carpel tunnel    . Foot surgery    . Abdominal hysterectomy      partial  . Tonsillectomy    . Breast surgery      lumpectomy left breast  . Colonoscopy w/ polypectomy    . Shoulder arthroscopy with subacromial decompression, rotator cuff repair and bicep tendon repair Right 05/26/2012    Procedure: Right Shoulder Diagnostic Operative Arthroscopy, Subacromial Decompression, Biceps Tenodesis, Mini Open Rotator Cuff Repair;  Surgeon: [name], MD;  Location: [facility];  Service: Orthopedics;  Laterality: Right;  Right Shoulder Diagnostic Operative Arthroscopy, Subacromial Decompression, Biceps Tenodesis, Mini Open Rotator Cuff Repair    Family History  Problem Relation Age of Onset  . Adopted: Yes  . Coronary artery disease    . Diabetes Mother   . Heart disease Mother   . Hyperlipidemia Mother     Social History:  reports that she quit smoking about 25 years ago. She has never used smokeless tobacco. She reports that she does not drink alcohol or use illicit drugs.    Review of Systems   Most recent eye exam was in 12/2014       Lipids: followed by PCP. She has inadequate control  with simvastatin, apparently taking only 20 mg        Lab Results  Component Value Date   CHOL 197 03/22/2015   HDL 52.10 03/22/2015   LDLCALC 121* 03/22/2015   LDLDIRECT 129.5 02/06/2012   TRIG 120.0 03/22/2015   CHOLHDL 4 03/22/2015                  Thyroid:    She thinks she has been hypothyroid for about 40 years and is taking a supplement of 137 g Levothroid with consistent control   Lab Results  Component Value Date   TSH 0.42 11/11/2014   TSH 0.52 05/26/2014   TSH 0.40 01/07/2014   FREET4 1.03 06/28/2010   FREET4 1.36 11/27/2007        The blood pressure has been treated by various medications including Lotrel maximum dose She still takes some meloxicam  EDEMA: She takes Lasix for this    Physical Examination:  BP 134/82 mmHg  Pulse 88  Temp(Src) 98.7 F (37.1 C)  Resp 16  Ht 5\' 5"  (1.651 m)  Wt 242 lb 3.2 oz (109.861 kg)  BMI 40.30 kg/m2  SpO2 97%     ASSESSMENT/PLAN:  Diabetes type 2, uncontrolled with BMI about 40  See history of present illness for detailed discussion of  current management, blood sugar patterns and problems identified  She has had improved control with the current regimen of Lantus and Novolog Probably has benefited from taking Lantus in the morning instead of evening since she tends to have some postprandial hyperglycemia However even though she is trying to do better with compliance with mealtime insulin she is not always taking it especially when eating out Diet is somewhat better but has not lost weight; she can cut out simple sugars in sweet tea also Fasting readings are reasonably controlled with some fluctuation probably based on control of her postprandial readings the night before Since her A1c is improving at 7% she can continue her basal bolus insulin regimen and metformin  Recommendations:  She was told to  adjust the dose based on meal size  Start exercise again  Better control of simple sugars in tea  Probably  needs 2-4 units more at mealtimes when eating out and larger meals in the evening  Discussed postprandial blood sugar targets  HYPERTENSION: Now better controlled with improved diuresis  Obesity: She is a candidate for a medication such as Invokana which may help her in multiple ways but cost may be a factor  Hyperlipidemia: Consider changing simvastatin to Lipitor or Crestor since dosage limited by interaction with amlodipine, will send messages to PCP  Patient Instructions  Check blood sugars on waking up  3-4  times a week Also check blood sugars about 2 hours after a meal and do this after different meals by rotation  Recommended blood sugar levels on waking up is 90-130 and about 2 hours after meal is 130-160  Please bring your blood sugar monitor to each visit, thank you  May adjust Novolog up/down 2 units based on meal size  Get back to Gym   Counseling time on subjects discussed above is over 50% of today's 25 minute visit  Nao Linz 03/28/2015, 4:00 PM   Note: This office note was prepared with Estate agent. Any transcriptional errors that result from this process are unintentional.

## 2015-03-28 NOTE — Patient Instructions (Addendum)
Check blood sugars on waking up  3-4  times a week Also check blood sugars about 2 hours after a meal and do this after different meals by rotation  Recommended blood sugar levels on waking up is 90-130 and about 2 hours after meal is 130-160  Please bring your blood sugar monitor to each visit, thank you  May adjust Novolog up/down 2 units based on meal size  Get back to [employer]

## 2015-04-05 ENCOUNTER — Telehealth: Payer: Self-pay | Admitting: Endocrinology

## 2015-04-05 NOTE — Telephone Encounter (Signed)
Please see below and advise.

## 2015-04-05 NOTE — Telephone Encounter (Signed)
Change to Tresiba U-200, 46 units in the morning daily, and may need to increase if her fasting readings are high

## 2015-04-05 NOTE — Telephone Encounter (Signed)
Patient called stating that her pharmacy is not offering Lantus   They will offer: [place]   Please call patient before submitting a new Rx  Call back: [phone]  Please advise

## 2015-04-06 NOTE — Telephone Encounter (Signed)
Message left for patient to return call.

## 2015-04-07 ENCOUNTER — Other Ambulatory Visit: Payer: Self-pay | Admitting: *Deleted

## 2015-04-07 MED ORDER — INSULIN DEGLUDEC 200 UNIT/ML ~~LOC~~ SOPN: 46.0000 [IU] | PEN_INJECTOR | Freq: Every morning | SUBCUTANEOUS | Status: DC

## 2015-04-07 NOTE — Telephone Encounter (Signed)
Information informed to the pt-please call in the tresiba to [facility] [address] please [phone] 90 day supply

## 2015-04-07 NOTE — Telephone Encounter (Signed)
rx sent

## 2015-04-14 ENCOUNTER — Other Ambulatory Visit: Payer: Self-pay | Admitting: Orthopedic Surgery

## 2015-04-14 DIAGNOSIS — M25551 Pain in right hip: Secondary | ICD-10-CM

## 2015-04-24 ENCOUNTER — Ambulatory Visit
Admission: RE | Admit: 2015-04-24 | Discharge: 2015-04-24 | Disposition: A | Discharge status: Home / Self Care | Payer: Medicare Other | Source: Ambulatory Visit | Attending: Orthopedic Surgery | Admitting: Orthopedic Surgery

## 2015-04-24 DIAGNOSIS — M25551 Pain in right hip: Secondary | ICD-10-CM

## 2015-05-08 DIAGNOSIS — Z1231 Encounter for screening mammogram for malignant neoplasm of breast: Secondary | ICD-10-CM | POA: Diagnosis not present

## 2015-05-08 DIAGNOSIS — Z01419 Encounter for gynecological examination (general) (routine) without abnormal findings: Secondary | ICD-10-CM | POA: Diagnosis not present

## 2015-05-08 DIAGNOSIS — Z6841 Body Mass Index (BMI) 40.0 and over, adult: Secondary | ICD-10-CM | POA: Diagnosis not present

## 2015-05-09 ENCOUNTER — Telehealth: Payer: Self-pay | Admitting: *Deleted

## 2015-05-09 DIAGNOSIS — E039 Hypothyroidism, unspecified: Secondary | ICD-10-CM

## 2015-05-09 MED ORDER — FUROSEMIDE 40 MG PO TABS: 80.0000 mg | ORAL_TABLET | Freq: Every day | ORAL | Status: DC

## 2015-05-09 MED ORDER — LINZESS 290 MCG PO CAPS: 290.0000 ug | ORAL_CAPSULE | Freq: Every day | ORAL | Status: DC

## 2015-05-09 MED ORDER — AMLODIPINE BESY-BENAZEPRIL HCL 10-40 MG PO CAPS: 1.0000 | ORAL_CAPSULE | Freq: Every day | ORAL | Status: DC

## 2015-05-09 MED ORDER — LEVOTHYROXINE SODIUM 137 MCG PO TABS: ORAL_TABLET | ORAL | Status: DC

## 2015-05-09 MED ORDER — FLUTICASONE PROPIONATE 50 MCG/ACT NA SUSP: 2.0000 | Freq: Every day | NASAL | Status: DC

## 2015-05-09 MED ORDER — POTASSIUM CHLORIDE ER 10 MEQ PO TBCR: 10.0000 meq | EXTENDED_RELEASE_TABLET | Freq: Two times a day (BID) | ORAL | Status: DC

## 2015-05-09 MED ORDER — SIMVASTATIN 20 MG PO TABS: 20.0000 mg | ORAL_TABLET | Freq: Every day | ORAL | Status: DC

## 2015-05-09 NOTE — Telephone Encounter (Signed)
Pt left msg on triage stating she is now using [facility], and needing rx sent to them. Called pt bck to verify what medication she is needing. Pt states she need her lasix now but they stated for md office to send updated scripts. Inform will fax to mail order...Johny Chess

## 2015-05-22 ENCOUNTER — Ambulatory Visit (INDEPENDENT_AMBULATORY_CARE_PROVIDER_SITE_OTHER): Payer: Medicare Other | Admitting: Internal Medicine

## 2015-05-22 ENCOUNTER — Encounter: Payer: Self-pay | Admitting: Internal Medicine

## 2015-05-22 VITALS — BP 142/70 | HR 82 | Temp 97.9°F | Resp 12 | Ht 65.0 in | Wt 239.0 lb

## 2015-05-22 DIAGNOSIS — I10 Essential (primary) hypertension: Secondary | ICD-10-CM | POA: Diagnosis not present

## 2015-05-22 DIAGNOSIS — E669 Obesity, unspecified: Secondary | ICD-10-CM

## 2015-05-22 NOTE — Patient Instructions (Signed)
We are not checking any blood work today and you are doing well.   Keep up the good work with your health. Work on exercising if you can to help with the health and to keep the sugars under good control.   Diabetes and Exercise Exercising regularly is important. It is not just about losing weight. It has many health benefits, such as:  Improving your overall fitness, flexibility, and endurance.  Increasing your bone density.  Helping with weight control.  Decreasing your body fat.  Increasing your muscle strength.  Reducing stress and tension.  Improving your overall health. People with diabetes who exercise gain additional benefits because exercise:  Reduces appetite.  Improves the body's use of blood sugar (glucose).  Helps lower or control blood glucose.  Decreases blood pressure.  Helps control blood lipids (such as cholesterol and triglycerides).  Improves the body's use of the hormone insulin by:  Increasing the body's insulin sensitivity.  Reducing the body's insulin needs.  Decreases the risk for heart disease because exercising:  Lowers cholesterol and triglycerides levels.  Increases the levels of good cholesterol (such as high-density lipoproteins [HDL]) in the body.  Lowers blood glucose levels. YOUR ACTIVITY PLAN  Choose an activity that you enjoy, and set realistic goals. To exercise safely, you should begin practicing any new physical activity slowly, and gradually increase the intensity of the exercise over time. Your health care provider or diabetes educator can help create an activity plan that works for you. General recommendations include:  Encouraging children to engage in at least 60 minutes of physical activity each day.  Stretching and performing strength training exercises, such as yoga or weight lifting, at least 2 times per week.  Performing a total of at least 150 minutes of moderate-intensity exercise each week, such as brisk walking or  water aerobics.  Exercising at least 3 days per week, making sure you allow no more than 2 consecutive days to pass without exercising.  Avoiding long periods of inactivity (90 minutes or more). When you have to spend an extended period of time sitting down, take frequent breaks to walk or stretch. RECOMMENDATIONS FOR EXERCISING WITH TYPE 1 OR TYPE 2 DIABETES   Check your blood glucose before exercising. If blood glucose levels are greater than 240 mg/dL, check for urine ketones. Do not exercise if ketones are present.  Avoid injecting insulin into areas of the body that are going to be exercised. For example, avoid injecting insulin into:  The arms when playing tennis.  The legs when jogging.  Keep a record of:  Food intake before and after you exercise.  Expected peak times of insulin action.  Blood glucose levels before and after you exercise.  The type and amount of exercise you have done.  Review your records with your health care provider. Your health care provider will help you to develop guidelines for adjusting food intake and insulin amounts before and after exercising.  If you take insulin or oral hypoglycemic agents, watch for signs and symptoms of hypoglycemia. They include:  Dizziness.  Shaking.  Sweating.  Chills.  Confusion.  Drink plenty of water while you exercise to prevent dehydration or heat stroke. Body water is lost during exercise and must be replaced.  Talk to your health care provider before starting an exercise program to make sure it is safe for you. Remember, almost any type of activity is better than none.   This information is not intended to replace advice given to you  by your health care provider. Make sure you discuss any questions you have with your health care provider.   Document Released: 06/08/2003 Document Revised: 08/02/2014 Document Reviewed: 08/25/2012 Elsevier Interactive Patient Education [employer].

## 2015-05-22 NOTE — Progress Notes (Signed)
Pre visit review using our clinic review tool, if applicable. No additional management support is needed unless otherwise documented below in the visit note. 

## 2015-05-26 NOTE — Assessment & Plan Note (Signed)
She is working on exercising more and we talked about how important that is for her long term health.

## 2015-05-26 NOTE — Progress Notes (Signed)
   Subjective:    Patient ID: Marissa Roberts, female    DOB: 12-Dec-1945, 70 y.o.   MRN: [id number]  HPI The patient is a 70 YO female coming in for follow up of her blood pressure (taking benazepril/amlodipine and lasix, not complicated, no side effects). No new complaints. No SOB or chest pains. No headaches. No symptoms from her medicines.   Review of Systems  Constitutional: Negative for fever, activity change, appetite change, fatigue and unexpected weight change.  Respiratory: Negative for cough, chest tightness, shortness of breath and wheezing.   Cardiovascular: Negative.   Gastrointestinal: Negative.   Musculoskeletal: Positive for myalgias and arthralgias. Negative for back pain, joint swelling and gait problem.  Skin: Negative.   Neurological: Negative for dizziness, weakness, numbness and headaches.  Psychiatric/Behavioral: Negative.       Objective:   Physical Exam  Constitutional: She is oriented to person, place, and time. She appears well-developed and well-nourished.  Overweight  HENT:  Head: Normocephalic and atraumatic.  Mouth/Throat: Oropharynx is clear and moist.  Eyes: EOM are normal.  Neck: Normal range of motion.  Cardiovascular: Normal rate.   Pulmonary/Chest: Effort normal. No respiratory distress. She has no wheezes. She has no rales.  Abdominal: Soft.  Musculoskeletal: She exhibits no edema.  Neurological: She is alert and oriented to person, place, and time. Coordination normal.  Skin: Skin is warm and dry.   Filed Vitals:   05/22/15 1005  BP: 142/70  Pulse: 82  Temp: 97.9 F (36.6 C)  TempSrc: Oral  Resp: 12  Height: 5\' 5"  (1.651 m)  Weight: 239 lb (108.41 kg)  SpO2: 98%      Assessment & Plan:

## 2015-05-26 NOTE — Assessment & Plan Note (Signed)
BP close to goal and recent CMP without indication for changes. She is working on exercise and diet to help both her sugars and her pressures.

## 2015-06-05 ENCOUNTER — Telehealth: Payer: Self-pay | Admitting: Endocrinology

## 2015-06-05 NOTE — Telephone Encounter (Signed)
Pt said that ever since her Lantus has been changed her sugars have been high and she is worried because she still has a few weeks between now and her appointment and wants to be advised as to what she needs to do.

## 2015-06-05 NOTE — Telephone Encounter (Signed)
She can switch to [place] to the evening but if morning sugars are still over 140 she can increase the dose by 4 units, no need to download meter

## 2015-06-05 NOTE — Telephone Encounter (Signed)
I called her back, phone picked up then disconnected.

## 2015-06-05 NOTE — Telephone Encounter (Signed)
Patient is not home at this time. Will call back later

## 2015-06-05 NOTE — Telephone Encounter (Signed)
Noted, patient is aware. 

## 2015-06-05 NOTE — Telephone Encounter (Signed)
Patient called this morning, she said since she's been on the tresiba her sugars have been going up.  03/03- 7:34 am-155 03/05- 6:34 am-147 03/06- 7:56 am-172  She is taking her insulin in the morning and wonders if she should take it at night?   She will bring her meter to me today to download. Please advise.

## 2015-06-07 DIAGNOSIS — N8181 Perineocele: Secondary | ICD-10-CM | POA: Diagnosis not present

## 2015-06-07 DIAGNOSIS — N949 Unspecified condition associated with female genital organs and menstrual cycle: Secondary | ICD-10-CM | POA: Diagnosis not present

## 2015-06-07 DIAGNOSIS — K5902 Outlet dysfunction constipation: Secondary | ICD-10-CM | POA: Diagnosis not present

## 2015-06-12 ENCOUNTER — Telehealth: Payer: Self-pay | Admitting: Endocrinology

## 2015-06-12 NOTE — Telephone Encounter (Signed)
Patient called stating that she is currently out of test strips   Rx: One touch Verio   Pharmacy: [facility]   Thank you

## 2015-06-13 NOTE — Telephone Encounter (Signed)
Pt called again requesting her test strips.

## 2015-06-14 ENCOUNTER — Other Ambulatory Visit: Payer: Self-pay | Admitting: *Deleted

## 2015-06-14 MED ORDER — GLUCOSE BLOOD VI STRP: ORAL_STRIP | Status: DC

## 2015-06-16 DIAGNOSIS — H40023 Open angle with borderline findings, high risk, bilateral: Secondary | ICD-10-CM | POA: Diagnosis not present

## 2015-06-21 ENCOUNTER — Other Ambulatory Visit (INDEPENDENT_AMBULATORY_CARE_PROVIDER_SITE_OTHER): Payer: Medicare Other

## 2015-06-21 DIAGNOSIS — Z794 Long term (current) use of insulin: Secondary | ICD-10-CM

## 2015-06-21 DIAGNOSIS — E1165 Type 2 diabetes mellitus with hyperglycemia: Secondary | ICD-10-CM | POA: Diagnosis not present

## 2015-06-21 LAB — BASIC METABOLIC PANEL
BUN: 12 mg/dL (ref 6–23)
CALCIUM: 9.8 mg/dL (ref 8.4–10.5)
CHLORIDE: 103 meq/L (ref 96–112)
CO2: 31 mEq/L (ref 19–32)
CREATININE: 0.97 mg/dL (ref 0.40–1.20)
GFR: 73.49 mL/min (ref >60.00)
Glucose, Bld: 136 mg/dL — ABNORMAL HIGH (ref 70–99)
Potassium: 4.4 mEq/L (ref 3.5–5.1)
Sodium: 141 mEq/L (ref 135–145)

## 2015-06-21 LAB — HEMOGLOBIN A1C: HEMOGLOBIN A1C: 7.8 % — AB (ref 4.6–6.5)

## 2015-06-26 ENCOUNTER — Ambulatory Visit (INDEPENDENT_AMBULATORY_CARE_PROVIDER_SITE_OTHER): Payer: Medicare Other | Admitting: Endocrinology

## 2015-06-26 ENCOUNTER — Other Ambulatory Visit: Payer: Self-pay | Admitting: *Deleted

## 2015-06-26 ENCOUNTER — Encounter: Payer: Self-pay | Admitting: Endocrinology

## 2015-06-26 VITALS — BP 128/76 | HR 75 | Temp 97.9°F | Resp 14 | Ht 65.0 in | Wt 238.2 lb

## 2015-06-26 DIAGNOSIS — E1169 Type 2 diabetes mellitus with other specified complication: Secondary | ICD-10-CM

## 2015-06-26 DIAGNOSIS — E1165 Type 2 diabetes mellitus with hyperglycemia: Secondary | ICD-10-CM | POA: Diagnosis not present

## 2015-06-26 DIAGNOSIS — E039 Hypothyroidism, unspecified: Secondary | ICD-10-CM

## 2015-06-26 DIAGNOSIS — E669 Obesity, unspecified: Secondary | ICD-10-CM

## 2015-06-26 DIAGNOSIS — E785 Hyperlipidemia, unspecified: Secondary | ICD-10-CM

## 2015-06-26 DIAGNOSIS — Z794 Long term (current) use of insulin: Secondary | ICD-10-CM

## 2015-06-26 MED ORDER — ROSUVASTATIN CALCIUM 20 MG PO TABS: 20.0000 mg | ORAL_TABLET | Freq: Every day | ORAL | Status: DC

## 2015-06-26 MED ORDER — INSULIN ASPART 100 UNIT/ML FLEXPEN: PEN_INJECTOR | SUBCUTANEOUS | Status: DC

## 2015-06-26 MED ORDER — INSULIN DEGLUDEC 200 UNIT/ML ~~LOC~~ SOPN: 52.0000 [IU] | PEN_INJECTOR | Freq: Every morning | SUBCUTANEOUS | Status: DC

## 2015-06-26 NOTE — Patient Instructions (Addendum)
Check blood sugars on waking up 3  times a week  Some at bedtime  Also check blood sugars about 2 hours after a meal and do this after different meals by rotation  Recommended blood sugar levels on waking up is 90-130 and about 2 hours after meal is 130-160  Please bring your blood sugar monitor to each visit, thank you  No sweet tea  Must take pen with you for eating out; may take 20 units with sweet tea  Simvastatin will need to change, call

## 2015-06-26 NOTE — Progress Notes (Signed)
Patient ID: Marissa Roberts, female   DOB: 11-08-1945, 70 y.o.   MRN: [id number]           Reason for Appointment:  Follow-up for Type 2 Diabetes and lipids  Referring physician: Megan Salon  History of Present Illness:          Diagnosis: Type 2 diabetes mellitus, date of diagnosis: 2005       Past history:  She thinks her blood sugars were only mildly increased at borderline levels at the onsetper Most likely she was treated with metformin initially and this has been continued.   At some point she was also tried on [place] for improving her control. She is not sure why she was started on insulin 5 years ago, presumably for worsening hyperglycemia Also not clear if she has tried various insulin regimens before starting Lantus and NovoLog Her A1c has been usually over 7% and gradually increasing over the last year  Recent history:   INSULIN regimen is  [place] 52 units hs, Novolog 15 units ac tid   Her A1c had come down to 7.0, but now up to 7.8 Previously had tried the V-go pump but subsequently she stopped doing the V-go pump because of the cost, skin irritation and local discomfort She has been switched to [place] in January but appears that she is needing at least the same amount of insulin.  She only increased the dose back to 52 about 3 weeks ago as fasting readings had been consistently high  Current blood sugar patterns, management and problems identified:  Her blood sugars are being monitored mostly in the mornings again, she says she is too busy to check them during the day  Blood sugars are variable in the mornings although around 130 in the last week or so  She has occasional readings after breakfast which may be higher.  Has only one or 2 readings in the afternoon and evening and these are not consistently high  She has difficulty with compliance with NOVOLOG and she does not like to take it with her when she is eating out, taking it mostly when she  comes back  She is also drinking sweet tea when eating out  Does not adjust her insulin doses based on meal size or carbohydrate intake  Has variable meal size and composition.  She says that she will be eating little less in the morning if her sugar is higher  Not going to exercise recently and has not been motivated to go back to the gym  No hypoglycemia recently  Oral hypoglycemic drugs the patient is taking are: Metformin 2 g daily    Side effects from medications have been:none  Compliance with the medical regimen: Fair  Glucose monitoring:  done 1-2 time a day         Glucometer:  One Touch.      Blood Glucose readings by time of day  Mean values apply above for all meters except median for One Touch  PRE-MEAL Fasting Lunch Dinner Bedtime Overall  Glucose range: 105-178       Mean/median:     155+/-24    POST-MEAL PC Breakfast PC Lunch PC Dinner  Glucose range: 106-204  166  154   Mean/median:       Self-care: The diet that the patient has been following is: None    Meals: 3 meals per day. Breakfast is yogurt or toast, bacon, egg; usually not a lot of carbohydrates, rarely fried food  including fish.    Not always restricting fat intake especially when eating out        Exercise:  none now, previously up to 5 days  a week for about an hour at the gym in the mornings         Dietician visit, most recent:2014.               Weight history:  Wt Readings from Last 3 Encounters:  06/26/15 238 lb 3.2 oz (108.047 kg)  05/22/15 239 lb (108.41 kg)  03/28/15 242 lb 3.2 oz (109.861 kg)    Glycemic control:   Lab Results  Component Value Date   HGBA1C 7.8* 06/21/2015   HGBA1C 7.0* 03/22/2015   HGBA1C 7.5* 11/11/2014   Lab Results  Component Value Date   MICROALBUR 74.3* 03/22/2015   LDLCALC 121* 03/22/2015   CREATININE 0.97 06/21/2015         Medication List       This list is accurate as of: 06/26/15 11:00 AM.  Always use your most recent med list.                amLODipine-benazepril 10-40 MG capsule  Commonly known as:  LOTREL  Take 1 capsule by mouth daily.     CALCIUM 600 1500 (600 Ca) MG Tabs tablet  Generic drug:  calcium carbonate  Take 2 tablets by mouth daily.     cyanocobalamin 100 MCG tablet  Take 100 mcg by mouth daily.     EQ FIBER SUPPLEMENT PO  Take by mouth.     fluticasone 50 MCG/ACT nasal spray  Commonly known as:  FLONASE  Place 2 sprays into both nostrils daily.     furosemide 40 MG tablet  Commonly known as:  LASIX  Take 2 tablets (80 mg total) by mouth daily.     glucosamine-chondroitin 500-400 MG tablet  Take 1 tablet by mouth daily.     glucose blood test strip  Commonly known as:  ONETOUCH VERIO  Please check blood sugars at least half the time about 2 hours after any meal and 3 times per week on waking up. Dx code E11.9     insulin aspart 100 UNIT/ML FlexPen  Commonly known as:  NOVOLOG FLEXPEN  Inject up to 20 units before each meal     Insulin Degludec 200 UNIT/ML Sopn  Commonly known as:  TRESIBA FLEXTOUCH  Inject 52 Units into the skin every morning.     Insulin Pen Needle 31G X 8 MM Misc  Commonly known as:  B-D ULTRAFINE III SHORT PEN  Use to inject Lantus and novolog 4 times per day     levothyroxine 137 MCG tablet  Commonly known as:  SYNTHROID, LEVOTHROID  TAKE ONE TABLET BY MOUTH ONCE DAILY     LINZESS 290 MCG Caps capsule  Generic drug:  Linaclotide  Take 1 capsule (290 mcg total) by mouth daily.     magnesium oxide 400 MG tablet  Commonly known as:  MAG-OX  Take 400 mg by mouth daily.     metFORMIN 1000 MG tablet  Commonly known as:  GLUCOPHAGE  Take 1 tablet (1,000 mg total) by mouth 2 (two) times daily with a meal.     ONETOUCH DELICA LANCETS FINE Misc  Please check blood sugars at least half the time about 2 hours after any meal and 3 times per week on waking up. Dx code E11.9     potassium chloride 10 MEQ tablet  Commonly known as:  K-DUR  Take 1 tablet (10 mEq  total) by mouth 2 (two) times daily.     PROBIOTIC DAILY PO  Take by mouth.     rosuvastatin 20 MG tablet  Commonly known as:  CRESTOR  Take 1 tablet (20 mg total) by mouth daily.     simvastatin 20 MG tablet  Commonly known as:  ZOCOR  Take 1 tablet (20 mg total) by mouth at bedtime.     traMADol 50 MG tablet  Commonly known as:  ULTRAM  Take 1 tablet (50 mg total) by mouth every 12 (twelve) hours as needed.        Allergies:  Allergies  Allergen Reactions  . Contrast Media [Iodinated Diagnostic Agents]     Throat swells from ct con tra  . Adhesive [Tape]     No paper tape, causes blisters  . Erythromycin     REACTION: rash  . Penicillins     REACTION: rash    Past Medical History  Diagnosis Date  . Hypertension   . Thyroid disease   . Diabetes mellitus   . CHF (congestive heart failure) ([place])   . PONV (postoperative nausea and vomiting)   . Hypothyroidism   . Pneumonia     hx of  . Bronchitis     hx of  . GERD (gastroesophageal reflux disease)   . Arthritis   . Hypercholesteremia     Past Surgical History  Procedure Laterality Date  . Knee arhtroscopy    . Carpel tunnel    . Foot surgery    . Abdominal hysterectomy      partial  . Tonsillectomy    . Breast surgery      lumpectomy left breast  . Colonoscopy w/ polypectomy    . Shoulder arthroscopy with subacromial decompression, rotator cuff repair and bicep tendon repair Right 05/26/2012    Procedure: Right Shoulder Diagnostic Operative Arthroscopy, Subacromial Decompression, Biceps Tenodesis, Mini Open Rotator Cuff Repair;  Surgeon: [name], MD;  Location: [facility];  Service: Orthopedics;  Laterality: Right;  Right Shoulder Diagnostic Operative Arthroscopy, Subacromial Decompression, Biceps Tenodesis, Mini Open Rotator Cuff Repair    Family History  Problem Relation Age of Onset  . Adopted: Yes  . Coronary artery disease    . Diabetes Mother   . Heart disease Mother   . Hyperlipidemia  Mother     Social History:  reports that she quit smoking about 25 years ago. She has never used smokeless tobacco. She reports that she does not drink alcohol or use illicit drugs.    Review of Systems   Most recent eye exam was in 12/2014       Lipids: followed by PCP. She has inadequate control with simvastatin,  taking only 20 mg, this has not been adjusted by her PCP        Lab Results  Component Value Date   CHOL 197 03/22/2015   HDL 52.10 03/22/2015   LDLCALC 121* 03/22/2015   LDLDIRECT 129.5 02/06/2012   TRIG 120.0 03/22/2015   CHOLHDL 4 03/22/2015                  Thyroid:    She thinks she has been hypothyroid for about 40 years and is taking a supplement of 137 g levothyroxine with consistent control   Lab Results  Component Value Date   TSH 0.42 11/11/2014   TSH 0.52 05/26/2014   TSH 0.40 01/07/2014   FREET4  1.03 06/28/2010   FREET4 1.36 11/27/2007        The blood pressure has been treated by various medications including Lotrel maximum dose She still takes some meloxicamAs needed  EDEMA: She takes Lasix with control    Physical Examination:  BP 128/76 mmHg  Pulse 75  Temp(Src) 97.9 F (36.6 C)  Resp 14  Ht 5\' 5"  (1.651 m)  Wt 238 lb 3.2 oz (108.047 kg)  BMI 39.64 kg/m2  SpO2 97%     ASSESSMENT/PLAN:  Diabetes type 2, uncontrolled with BMI about 40  See history of present illness for detailed discussion of  current management, blood sugar patterns and problems identified  She has had somewhat worsening control with A1c 7.8 She had not reported higher fasting readings on Tresiba 46 units until this month With taking 52 units for fasting readings are about 120-140 range now  As discussed above she has difficulty in compliance with taking her mealtime insulin when she is eating out Also was not watching her diet especially with drinking sweet drinks especially when eating out She refuses to consider dietitian consultation again    Recommendations:  She was told to  adjust the dose based on meal size and not change her diet especially with reducing protein in the morning  Start exercise regularly  Stop drinking sweet tea  Take insulin right before eating when eating out  If fasting blood sugar stays over 140 increase Tresiba by at least 2 units  Probably needs 2-4 units more at mealtimes when eating out and larger meals in the evening  Discussed postprandial blood sugar monitoring and she can at least check readings after evening meal at bedtime  Obesity: She is a candidate for a medication such as Invokana but she is reluctant to add another medication because of expense  Hyperlipidemia: Change simvastatin to Crestor 20 mg since dosage of simvastatin limited by interaction with amlodipine Follow-up on next visit  Hypothyroidism: Needs follow-up on the next visit  Patient Instructions  Check blood sugars on waking up 3  times a week  Some at bedtime  Also check blood sugars about 2 hours after a meal and do this after different meals by rotation  Recommended blood sugar levels on waking up is 90-130 and about 2 hours after meal is 130-160  Please bring your blood sugar monitor to each visit, thank you  No sweet tea  Must take pen with you for eating out; may take 20 units with sweet tea  Simvastatin will need to change, call    Counseling on subjects discussed above is over 50% of today's 25 minute visit  Marissa Roberts 06/26/2015, 11:00 AM   Note: This office note was prepared with Estate agent. Any transcriptional errors that result from this process are unintentional.

## 2015-07-19 DIAGNOSIS — K5902 Outlet dysfunction constipation: Secondary | ICD-10-CM | POA: Diagnosis not present

## 2015-09-25 ENCOUNTER — Other Ambulatory Visit: Payer: Self-pay | Admitting: Endocrinology

## 2015-09-26 ENCOUNTER — Other Ambulatory Visit (INDEPENDENT_AMBULATORY_CARE_PROVIDER_SITE_OTHER): Payer: Medicare Other

## 2015-09-26 DIAGNOSIS — E1165 Type 2 diabetes mellitus with hyperglycemia: Secondary | ICD-10-CM

## 2015-09-26 DIAGNOSIS — E039 Hypothyroidism, unspecified: Secondary | ICD-10-CM

## 2015-09-26 DIAGNOSIS — E785 Hyperlipidemia, unspecified: Secondary | ICD-10-CM | POA: Diagnosis not present

## 2015-09-26 DIAGNOSIS — Z794 Long term (current) use of insulin: Secondary | ICD-10-CM

## 2015-09-26 DIAGNOSIS — E1169 Type 2 diabetes mellitus with other specified complication: Secondary | ICD-10-CM

## 2015-09-26 LAB — COMPREHENSIVE METABOLIC PANEL
ALT: 16 U/L (ref 0–35)
AST: 15 U/L (ref 0–37)
Albumin: 4 g/dL (ref 3.5–5.2)
Alkaline Phosphatase: 61 U/L (ref 39–117)
BILIRUBIN TOTAL: 0.3 mg/dL (ref 0.2–1.2)
BUN: 10 mg/dL (ref 6–23)
CHLORIDE: 104 meq/L (ref 96–112)
CO2: 31 meq/L (ref 19–32)
Calcium: 9.5 mg/dL (ref 8.4–10.5)
Creatinine, Ser: 0.73 mg/dL (ref 0.40–1.20)
GFR: 101.93 mL/min (ref >60.00)
GLUCOSE: 165 mg/dL — AB (ref 70–99)
POTASSIUM: 3.8 meq/L (ref 3.5–5.1)
Sodium: 139 mEq/L (ref 135–145)
Total Protein: 7.2 g/dL (ref 6.0–8.3)

## 2015-09-26 LAB — HEMOGLOBIN A1C: HEMOGLOBIN A1C: 7.5 % — AB (ref 4.6–6.5)

## 2015-09-26 LAB — LIPID PANEL
CHOL/HDL RATIO: 3
Cholesterol: 174 mg/dL (ref 0–200)
HDL: 56 mg/dL (ref >39.00)
LDL CALC: 93 mg/dL (ref 0–99)
NONHDL: 118.33
Triglycerides: 125 mg/dL (ref 0.0–149.0)
VLDL: 25 mg/dL (ref 0.0–40.0)

## 2015-09-26 LAB — TSH: TSH: 1.27 u[IU]/mL (ref 0.35–4.50)

## 2015-09-26 LAB — T4, FREE: Free T4: 1.03 ng/dL (ref 0.60–1.60)

## 2015-09-29 ENCOUNTER — Ambulatory Visit (INDEPENDENT_AMBULATORY_CARE_PROVIDER_SITE_OTHER): Payer: Medicare Other | Admitting: Endocrinology

## 2015-09-29 ENCOUNTER — Encounter: Payer: Self-pay | Admitting: Endocrinology

## 2015-09-29 VITALS — BP 136/80 | HR 81 | Ht 65.0 in | Wt 243.0 lb

## 2015-09-29 DIAGNOSIS — E1165 Type 2 diabetes mellitus with hyperglycemia: Secondary | ICD-10-CM | POA: Diagnosis not present

## 2015-09-29 DIAGNOSIS — Z794 Long term (current) use of insulin: Secondary | ICD-10-CM | POA: Diagnosis not present

## 2015-09-29 NOTE — Progress Notes (Signed)
Patient ID: Marissa Roberts, female   DOB: Jun 25, 1945, 70 y.o.   MRN: [id number]           Reason for Appointment:  Follow-up for Type 2 Diabetes and lipids  Referring physician: Megan Salon  History of Present Illness:          Diagnosis: Type 2 diabetes mellitus, date of diagnosis: 2005       Past history:  She thinks her blood sugars were only mildly increased at borderline levels at the onsetper Most likely she was treated with metformin initially and this has been continued.   At some point she was also tried on [place] and [place] for improving her control. She is not sure why she was started on insulin 5 years ago, presumably for worsening hyperglycemia Also not clear if she has tried various insulin regimens before starting Lantus and NovoLog Her A1c has been usually over 7% and gradually increasing over the last year Previously had tried the V-go pump but subsequently she stopped doing the V-go pump because of the cost, skin irritation and local discomfort  Recent history:   INSULIN regimen is  Tresiba 52 units hs, Novolog 15 units ac tid   Her blood sugars have been somewhat more difficult to control in 2017, A1c only slightly better at 7.5  Current blood sugar patterns, management and problems identified:  Her blood sugars are being checked in the mornings only recently  She does not understand the need to check blood sugars after meals especially after evening meal  Again she does not adjust her mealtime insulin based on what she is eating except when she is going out to eat she will take 20 units because of drinking sweet tea usually  Blood sugars are fairly well controlled in the morning with a slight variability  Not going to exercise at all and only trying to do a little walking when she can No hypoglycemia recently  Oral hypoglycemic drugs the patient is taking are: Metformin 2 g daily    Side effects from medications have been:none  Compliance with the  medical regimen: Fair  Glucose monitoring:  done 1-2 time a day         Glucometer:  One Touch.      Blood Glucose readings average 126, range 85-161   Self-care: The diet that the patient has been following is: None    Meals: 3 meals per day. Breakfast is yogurt or toast, bacon, egg; usually not a lot of carbohydrates, rarely fried food including fish.    Not always restricting fat intake especially when eating out        Exercise:  none now, previously up to 5 days a week for about an hour at the gym in the mornings         Dietician visit, most recent:2014.               Weight history:  Wt Readings from Last 3 Encounters:  09/29/15 243 lb (110.224 kg)  06/26/15 238 lb 3.2 oz (108.047 kg)  05/22/15 239 lb (108.41 kg)    Glycemic control:   Lab Results  Component Value Date   HGBA1C 7.5* 09/26/2015   HGBA1C 7.8* 06/21/2015   HGBA1C 7.0* 03/22/2015   Lab Results  Component Value Date   MICROALBUR 74.3* 03/22/2015   LDLCALC 93 09/26/2015   CREATININE 0.73 09/26/2015         Medication List       This list is accurate  as of: 09/29/15  9:54 AM.  Always use your most recent med list.               amLODipine-benazepril 10-40 MG capsule  Commonly known as:  LOTREL  Take 1 capsule by mouth daily.     CALCIUM 600 1500 (600 Ca) MG Tabs tablet  Generic drug:  calcium carbonate  Take 2 tablets by mouth daily.     cyanocobalamin 100 MCG tablet  Take 100 mcg by mouth daily.     EQ FIBER SUPPLEMENT PO  Take by mouth.     fluticasone 50 MCG/ACT nasal spray  Commonly known as:  FLONASE  Place 2 sprays into both nostrils daily.     furosemide 40 MG tablet  Commonly known as:  LASIX  Take 2 tablets (80 mg total) by mouth daily.     glucosamine-chondroitin 500-400 MG tablet  Take 1 tablet by mouth daily.     glucose blood test strip  Commonly known as:  ONETOUCH VERIO  Please check blood sugars at least half the time about 2 hours after any meal and 3 times  per week on waking up. Dx code E11.9     insulin aspart 100 UNIT/ML FlexPen  Commonly known as:  NOVOLOG FLEXPEN  Inject up to 20 units before each meal     Insulin Degludec 200 UNIT/ML Sopn  Commonly known as:  TRESIBA FLEXTOUCH  Inject 52 Units into the skin every morning.     Insulin Pen Needle 31G X 8 MM Misc  Commonly known as:  B-D ULTRAFINE III SHORT PEN  Use to inject Lantus and novolog 4 times per day     levothyroxine 137 MCG tablet  Commonly known as:  SYNTHROID, LEVOTHROID  TAKE ONE TABLET BY MOUTH ONCE DAILY     LINZESS 290 MCG Caps capsule  Generic drug:  linaclotide  Take 1 capsule (290 mcg total) by mouth daily.     magnesium oxide 400 MG tablet  Commonly known as:  MAG-OX  Take 400 mg by mouth daily.     metFORMIN 1000 MG tablet  Commonly known as:  GLUCOPHAGE  TAKE ONE TABLET BY MOUTH TWICE DAILY WITH MEALS     ONETOUCH DELICA LANCETS FINE Misc  Please check blood sugars at least half the time about 2 hours after any meal and 3 times per week on waking up. Dx code E11.9     potassium chloride 10 MEQ tablet  Commonly known as:  K-DUR  Take 1 tablet (10 mEq total) by mouth 2 (two) times daily.     PROBIOTIC DAILY PO  Take by mouth.     rosuvastatin 20 MG tablet  Commonly known as:  CRESTOR  Take 1 tablet (20 mg total) by mouth daily.     traMADol 50 MG tablet  Commonly known as:  ULTRAM  Take 1 tablet (50 mg total) by mouth every 12 (twelve) hours as needed.        Allergies:  Allergies  Allergen Reactions  . Contrast Media [Iodinated Diagnostic Agents]     Throat swells from ct con tra  . Adhesive [Tape]     No paper tape, causes blisters  . Erythromycin     REACTION: rash  . Penicillins     REACTION: rash    Past Medical History  Diagnosis Date  . Hypertension   . Thyroid disease   . Diabetes mellitus   . CHF (congestive heart failure) ([place])   .  PONV (postoperative nausea and vomiting)   . Hypothyroidism   . Pneumonia     hx  of  . Bronchitis     hx of  . GERD (gastroesophageal reflux disease)   . Arthritis   . Hypercholesteremia     Past Surgical History  Procedure Laterality Date  . Knee arhtroscopy    . Carpel tunnel    . Foot surgery    . Abdominal hysterectomy      partial  . Tonsillectomy    . Breast surgery      lumpectomy left breast  . Colonoscopy w/ polypectomy    . Shoulder arthroscopy with subacromial decompression, rotator cuff repair and bicep tendon repair Right 05/26/2012    Procedure: Right Shoulder Diagnostic Operative Arthroscopy, Subacromial Decompression, Biceps Tenodesis, Mini Open Rotator Cuff Repair;  Surgeon: [name], MD;  Location: [place];  Service: Orthopedics;  Laterality: Right;  Right Shoulder Diagnostic Operative Arthroscopy, Subacromial Decompression, Biceps Tenodesis, Mini Open Rotator Cuff Repair    Family History  Problem Relation Age of Onset  . Adopted: Yes  . Coronary artery disease    . Diabetes Mother   . Heart disease Mother   . Hyperlipidemia Mother     Social History:  reports that she quit smoking about 25 years ago. She has never used smokeless tobacco. She reports that she does not drink alcohol or use illicit drugs.    Review of Systems   Most recent eye exam was in 12/2014       Lipids: followed by PCP. She has Been taking Crestor 20 mg, previously LDL was high with simvastatin 20 mg LDL below 100 now        Lab Results  Component Value Date   CHOL 174 09/26/2015   HDL 56.00 09/26/2015   LDLCALC 93 09/26/2015   LDLDIRECT 129.5 02/06/2012   TRIG 125.0 09/26/2015   CHOLHDL 3 09/26/2015                  Thyroid:    She thinks she has been hypothyroid for about 40 years and is taking a supplement of 137 g levothyroxine with consistent control   Lab Results  Component Value Date   TSH 1.27 09/26/2015   TSH 0.42 11/11/2014   TSH 0.52 05/26/2014   FREET4 1.03 09/26/2015   FREET4 1.03 06/28/2010   FREET4 1.36 11/27/2007          The blood pressure has been treated by various medications including Lotrel maximum dose She still takes some meloxicam  as needed  EDEMA: She takes Lasix 80 mg daily with control of swelling but complaining about muscle cramps despite taking potassium and magnesium  Physical Examination:  BP 136/80 mmHg  Pulse 81  Ht 5\' 5"  (1.651 m)  Wt 243 lb (110.224 kg)  BMI 40.44 kg/m2  SpO2 97%     ASSESSMENT/PLAN:  Diabetes type 2, uncontrolled with BMI about 40  See history of present illness for detailed discussion of  current management, blood sugar patterns and problems identified  She has had inadequate control with A1c 7.5 reflecting postprandial hyperglycemia since her fasting readings are relatively good and averaging 126 recently She can manage her meals better and try to lose weight which she is not able to Also if she tries to do some postprandial readings she can adjust her Novolog based on what she is eating Still does not consistently avoid sweet tea and does not have a meal plan, most likely  not watching her diet when she goes out to eat  Recommendations:  Glucose monitoring as above  Consultation with dietitian  No change in [place] as yet  Obesity: She Will be seen by the dietitian  Hyperlipidemia:  Continue Crestor 20 mg since lipids are well controlled Follow-up on next visit  Hypothyroidism: Needs follow-up every 6 months  Patient Instructions  Check blood sugars on waking up  3 times a week Also check blood sugars about 2 hours after a meal and do this after different meals by rotation  Recommended blood sugar levels on waking up is 90-130 and about 2 hours after meal is 130-160  Please bring your blood sugar monitor to each visit, thank you     Counseling on subjects discussed above is over 50% of today's 25 minute visit  Jax Abdelrahman 09/29/2015, 9:54 AM   Note: This office note was prepared with Dragon voice recognition system technology. Any  transcriptional errors that result from this process are unintentional.

## 2015-09-29 NOTE — Patient Instructions (Addendum)
Check blood sugars on waking up 3  times a week  Also check blood sugars about 2 hours after a meal and do this after different meals by rotation  Recommended blood sugar levels on waking up is 90-130 and about 2 hours after meal is 130-160  Please bring your blood sugar monitor to each visit, thank you  

## 2015-10-12 DIAGNOSIS — H40023 Open angle with borderline findings, high risk, bilateral: Secondary | ICD-10-CM | POA: Diagnosis not present

## 2015-10-12 DIAGNOSIS — E119 Type 2 diabetes mellitus without complications: Secondary | ICD-10-CM | POA: Diagnosis not present

## 2015-11-14 ENCOUNTER — Other Ambulatory Visit: Payer: Self-pay | Admitting: Endocrinology

## 2015-11-18 DIAGNOSIS — K625 Hemorrhage of anus and rectum: Secondary | ICD-10-CM | POA: Diagnosis not present

## 2015-11-27 DIAGNOSIS — M25511 Pain in right shoulder: Secondary | ICD-10-CM | POA: Diagnosis not present

## 2015-11-29 ENCOUNTER — Other Ambulatory Visit: Payer: Self-pay | Admitting: Orthopedic Surgery

## 2015-11-29 DIAGNOSIS — M25511 Pain in right shoulder: Secondary | ICD-10-CM

## 2015-12-05 ENCOUNTER — Ambulatory Visit
Admission: RE | Admit: 2015-12-05 | Discharge: 2015-12-05 | Disposition: A | Discharge status: Home / Self Care | Payer: Medicare Other | Source: Ambulatory Visit | Attending: Orthopedic Surgery | Admitting: Orthopedic Surgery

## 2015-12-05 DIAGNOSIS — M25511 Pain in right shoulder: Secondary | ICD-10-CM | POA: Diagnosis not present

## 2015-12-20 DIAGNOSIS — M25511 Pain in right shoulder: Secondary | ICD-10-CM | POA: Diagnosis not present

## 2015-12-26 ENCOUNTER — Other Ambulatory Visit (INDEPENDENT_AMBULATORY_CARE_PROVIDER_SITE_OTHER): Payer: Medicare Other

## 2015-12-26 ENCOUNTER — Other Ambulatory Visit: Payer: Self-pay | Admitting: Endocrinology

## 2015-12-26 DIAGNOSIS — E1165 Type 2 diabetes mellitus with hyperglycemia: Secondary | ICD-10-CM

## 2015-12-26 DIAGNOSIS — Z794 Long term (current) use of insulin: Secondary | ICD-10-CM

## 2015-12-26 LAB — BASIC METABOLIC PANEL
BUN: 10 mg/dL (ref 6–23)
CALCIUM: 9.1 mg/dL (ref 8.4–10.5)
CO2: 30 mEq/L (ref 19–32)
Chloride: 104 mEq/L (ref 96–112)
Creatinine, Ser: 0.72 mg/dL (ref 0.40–1.20)
GFR: 103.49 mL/min (ref >60.00)
Glucose, Bld: 128 mg/dL — ABNORMAL HIGH (ref 70–99)
POTASSIUM: 4.3 meq/L (ref 3.5–5.1)
SODIUM: 141 meq/L (ref 135–145)

## 2015-12-26 LAB — HEMOGLOBIN A1C: HEMOGLOBIN A1C: 7.3 % — AB (ref 4.6–6.5)

## 2015-12-28 ENCOUNTER — Other Ambulatory Visit: Payer: Self-pay | Admitting: Endocrinology

## 2015-12-29 ENCOUNTER — Ambulatory Visit: Payer: Medicare Other | Admitting: Endocrinology

## 2016-01-02 ENCOUNTER — Encounter: Payer: Self-pay | Admitting: Endocrinology

## 2016-01-02 ENCOUNTER — Ambulatory Visit (INDEPENDENT_AMBULATORY_CARE_PROVIDER_SITE_OTHER): Payer: Medicare Other | Admitting: Endocrinology

## 2016-01-02 VITALS — BP 150/84 | HR 86 | Ht 65.0 in | Wt 241.0 lb

## 2016-01-02 DIAGNOSIS — E669 Obesity, unspecified: Secondary | ICD-10-CM

## 2016-01-02 DIAGNOSIS — I10 Essential (primary) hypertension: Secondary | ICD-10-CM | POA: Diagnosis not present

## 2016-01-02 DIAGNOSIS — E1165 Type 2 diabetes mellitus with hyperglycemia: Secondary | ICD-10-CM | POA: Diagnosis not present

## 2016-01-02 DIAGNOSIS — Z23 Encounter for immunization: Secondary | ICD-10-CM

## 2016-01-02 DIAGNOSIS — Z794 Long term (current) use of insulin: Secondary | ICD-10-CM

## 2016-01-02 MED ORDER — GUANFACINE HCL 1 MG PO TABS: 1.0000 mg | ORAL_TABLET | Freq: Every day | ORAL | 2 refills | Status: DC

## 2016-01-02 NOTE — Progress Notes (Signed)
Patient ID: Marissa Roberts, female   DOB: 01-31-1946, 70 y.o.   MRN: [id number]           Reason for Appointment:  Follow-up for Type 2 Diabetes    History of Present Illness:          Diagnosis: Type 2 diabetes mellitus, date of diagnosis: 2005       Past history:  She thinks her blood sugars were only mildly increased at borderline levels at the onsetper Most likely she was treated with metformin initially and this has been continued.   At some point she was also tried on [place] and [place] for improving her control. She is not sure why she was started on insulin 5 years ago, presumably for worsening hyperglycemia Also not clear if she has tried various insulin regimens before starting Lantus and NovoLog Her A1c has been usually over 7% and gradually increasing over the last year Previously had tried the V-go pump but subsequently she stopped doing the V-go pump because of the cost, skin irritation and local discomfort  Recent history:   INSULIN regimen is  Tresiba 52 units hs, Novolog 20 units ac tid  Oral hypoglycemic drugs the patient is taking are: Metformin 2 g daily  Her blood sugars appear to be gradually improving with A1c now 7.3, previously as high as 7.8 this year  Current blood sugar patterns, management and problems identified:  Her blood sugars are being checked in the mornings only again despite reminders to check postprandial readings  She does not understand the need to check blood sugars after meals  She has gone up on her NovoLog to 20 units on her own instead of 15  Again she does not adjust her mealtime insulin based on what she is eating  Only she is going out to eat she will take 5 more units  Blood sugars are fairly well controlled in the morning but appeared to be relatively higher in the last 10 days; sometimes will have more carbohydrates late at night or snacks  Not going to exercise again despite reminders  Again has difficulty losing  weight No hypoglycemia recently      Side effects from medications have been:none  Compliance with the medical regimen: Fair  Glucose monitoring:  done 1-2 time a day         Glucometer:  One Touch.      Blood Glucose readings from download  MEDIAN 119, range 88-182   Self-care: The diet that the patient has been following is: None    Meals: 3 meals per day. Breakfast is yogurt or toast, bacon, egg; usually not a lot of carbohydrates, rarely fried food including fish.    Not always restricting fat intake or sweet tea especially when eating out        Exercise:  some walking Dietician visit, most recent:2014.               Weight history:  Wt Readings from Last 3 Encounters:  01/02/16 241 lb (109.3 kg)  09/29/15 243 lb (110.2 kg)  06/26/15 238 lb 3.2 oz (108 kg)    Glycemic control:   Lab Results  Component Value Date   HGBA1C 7.3 (H) 12/26/2015   HGBA1C 7.5 (H) 09/26/2015   HGBA1C 7.8 (H) 06/21/2015   Lab Results  Component Value Date   MICROALBUR 74.3 (H) 03/22/2015   LDLCALC 93 09/26/2015   CREATININE 0.72 12/26/2015         Medication List  Accurate as of 01/02/16  1:04 PM. Always use your most recent med list.          amLODipine-benazepril 10-40 MG capsule Commonly known as:  LOTREL Take 1 capsule by mouth daily.   CALCIUM 600 1500 (600 Ca) MG Tabs tablet Generic drug:  calcium carbonate Take 2 tablets by mouth daily.   cyanocobalamin 100 MCG tablet Take 100 mcg by mouth daily.   EASY TOUCH PEN NEEDLES 31G X 8 MM Misc Generic drug:  Insulin Pen Needle USE TO INJECT LANTUS AND NOVOLOG 4 TIMES PER DAY   EQ FIBER SUPPLEMENT PO Take by mouth.   fluticasone 50 MCG/ACT nasal spray Commonly known as:  FLONASE Place 2 sprays into both nostrils daily.   furosemide 40 MG tablet Commonly known as:  LASIX Take 2 tablets (80 mg total) by mouth daily.   glucosamine-chondroitin 500-400 MG tablet Take 1 tablet by mouth daily.   glucose  blood test strip Commonly known as:  ONETOUCH VERIO Please check blood sugars at least half the time about 2 hours after any meal and 3 times per week on waking up. Dx code E11.9   guanFACINE 1 MG tablet Commonly known as:  TENEX Take 1 tablet (1 mg total) by mouth at bedtime.   insulin aspart 100 UNIT/ML FlexPen Commonly known as:  NOVOLOG FLEXPEN Inject up to 20 units before each meal   levothyroxine 137 MCG tablet Commonly known as:  SYNTHROID, LEVOTHROID TAKE ONE TABLET BY MOUTH ONCE DAILY   LINZESS 290 MCG Caps capsule Generic drug:  linaclotide Take 1 capsule (290 mcg total) by mouth daily.   magnesium oxide 400 MG tablet Commonly known as:  MAG-OX Take 400 mg by mouth daily.   metFORMIN 1000 MG tablet Commonly known as:  GLUCOPHAGE TAKE ONE TABLET BY MOUTH TWICE DAILY WITH MEALS   ONETOUCH DELICA LANCETS FINE Misc Please check blood sugars at least half the time about 2 hours after any meal and 3 times per week on waking up. Dx code E11.9   potassium chloride 10 MEQ tablet Commonly known as:  K-DUR Take 1 tablet (10 mEq total) by mouth 2 (two) times daily.   PROBIOTIC DAILY PO Take by mouth.   rosuvastatin 20 MG tablet Commonly known as:  CRESTOR Take 1 tablet (20 mg total) by mouth daily.   traMADol 50 MG tablet Commonly known as:  ULTRAM Take 1 tablet (50 mg total) by mouth every 12 (twelve) hours as needed.   TRESIBA FLEXTOUCH 200 UNIT/ML Sopn Generic drug:  Insulin Degludec INJECT 52 UNITS INTO THE SKIN EVERY MORNING.       Allergies:  Allergies  Allergen Reactions  . Contrast Media [Iodinated Diagnostic Agents]     Throat swells from ct con tra  . Adhesive [Tape]     No paper tape, causes blisters  . Erythromycin     REACTION: rash  . Penicillins     REACTION: rash    Past Medical History:  Diagnosis Date  . Arthritis   . Bronchitis    hx of  . CHF (congestive heart failure) ([place])   . Diabetes mellitus   . GERD (gastroesophageal  reflux disease)   . Hypercholesteremia   . Hypertension   . Hypothyroidism   . Pneumonia    hx of  . PONV (postoperative nausea and vomiting)   . Thyroid disease     Past Surgical History:  Procedure Laterality Date  . ABDOMINAL HYSTERECTOMY     partial  .  BREAST SURGERY     lumpectomy left breast  . carpel tunnel    . COLONOSCOPY W/ POLYPECTOMY    . FOOT SURGERY    . knee arhtroscopy    . SHOULDER ARTHROSCOPY WITH SUBACROMIAL DECOMPRESSION, ROTATOR CUFF REPAIR AND BICEP TENDON REPAIR Right 05/26/2012   Procedure: Right Shoulder Diagnostic Operative Arthroscopy, Subacromial Decompression, Biceps Tenodesis, Mini Open Rotator Cuff Repair;  Surgeon: [name], MD;  Location: [place];  Service: Orthopedics;  Laterality: Right;  Right Shoulder Diagnostic Operative Arthroscopy, Subacromial Decompression, Biceps Tenodesis, Mini Open Rotator Cuff Repair  . TONSILLECTOMY      Family History  Problem Relation Age of Onset  . Adopted: Yes  . Coronary artery disease    . Diabetes Mother   . Heart disease Mother   . Hyperlipidemia Mother     Social History:  reports that she quit smoking about 25 years ago. She has never used smokeless tobacco. She reports that she does not drink alcohol or use drugs.    Review of Systems   Most recent eye exam was in 12/2014    Lipids: followed by PCP. Been taking Crestor 20 mg, previously LDL was high with simvastatin 20 mg LDL below 100        Lab Results  Component Value Date   CHOL 174 09/26/2015   HDL 56.00 09/26/2015   LDLCALC 93 09/26/2015   LDLDIRECT 129.5 02/06/2012   TRIG 125.0 09/26/2015   CHOLHDL 3 09/26/2015                  Thyroid:     Apparently has been hypothyroid for about 40 years and is taking a supplement of 137 g levothyroxine with consistent control   Lab Results  Component Value Date   TSH 1.27 09/26/2015   TSH 0.42 11/11/2014   TSH 0.52 05/26/2014   FREET4 1.03 09/26/2015   FREET4 1.03 06/28/2010    FREET4 1.36 11/27/2007        The blood pressure has been treated by various medications including Lotrel maximum dose Not clear if she sees the PCP for this Blood pressure appears to be significantly high today  She still takes some meloxicam  as needed  EDEMA: She takes Lasix 80 mg daily with control of swelling but may occasionally take an extra 40 mg  Physical Examination:  BP (!) 150/84   Pulse 86   Ht 5\' 5"  (1.651 m)   Wt 241 lb (109.3 kg)   BMI 40.10 kg/m      ASSESSMENT/PLAN:  Diabetes type 2, uncontrolled with BMI about 40  See history of present illness for detailed discussion of  current management, blood sugar patterns and problems identified  She has had relatively better controlled with A1c 7.3 She is taking basal bolus insulin and metformin but relatively high doses of mealtime coverage Although she does need to adjust her NovoLog based on what she is eating she appears to take the same dose at all meals She also has difficulty losing weight because of inadequate exercise and not consistently watching her diet or sweet drinks  Recommendations:  Glucose monitoring as above, she can try to check her evening blood sugar at the same time is doing her [place] Aas injection   Also may try to check some readings in the afternoon  She can do less frequent monitoring in the morning but she takes this is a habit  No change in [place] as yet  We will again consider consultation  with dietitian on the next visit   HYPERTENSION: Not adequately controlled: Although she may benefit from using Benicar HCTZ and amlodipine she currently has a large supply of Lotrel and wants to finish this. Will have her try Tenex 1 mg at bedtime in addition She may have less tendency to edema with reducing amlodipine on the next prescription down to 5 mg  Hyperlipidemia:  Continue Crestor 20 mg since lipids are well controlled Follow-up on next visit  Hypothyroidism: Needs follow-up every  6 months  High-dose influenza vaccine given  Patient Instructions  Check blood sugars on waking up  3-4x per week  Also check blood sugars about 2 hours after a meal or at bedtime and do this after different meals by rotation  Recommended blood sugar levels on waking up is 90-130 and about 2 hours after meal is 130-160  Please bring your blood sugar monitor to each visit, thank you   Counseling on subjects discussed above is over 50% of today's 25 minute visit  Luke Falero 01/02/2016, 1:04 PM   Note: This office note was prepared with Estate agent. Any transcriptional errors that result from this process are unintentional.

## 2016-01-02 NOTE — Patient Instructions (Signed)
Check blood sugars on waking up  3-4x per week  Also check blood sugars about 2 hours after a meal or at bedtime and do this after different meals by rotation  Recommended blood sugar levels on waking up is 90-130 and about 2 hours after meal is 130-160  Please bring your blood sugar monitor to each visit, thank you

## 2016-01-03 ENCOUNTER — Other Ambulatory Visit: Payer: Self-pay

## 2016-01-04 ENCOUNTER — Other Ambulatory Visit: Payer: Self-pay | Admitting: *Deleted

## 2016-01-04 ENCOUNTER — Telehealth: Payer: Self-pay | Admitting: Endocrinology

## 2016-01-04 MED ORDER — ROSUVASTATIN CALCIUM 20 MG PO TABS: 20.0000 mg | ORAL_TABLET | Freq: Every day | ORAL | 1 refills | Status: DC

## 2016-01-04 MED ORDER — ONETOUCH DELICA LANCETS FINE MISC: 2 refills | Status: DC

## 2016-01-04 MED ORDER — POTASSIUM CHLORIDE ER 10 MEQ PO TBCR: 10.0000 meq | EXTENDED_RELEASE_TABLET | Freq: Two times a day (BID) | ORAL | 3 refills | Status: DC

## 2016-01-04 MED ORDER — GLUCOSE BLOOD VI STRP: ORAL_STRIP | 2 refills | Status: DC

## 2016-01-04 MED ORDER — INSULIN DEGLUDEC 200 UNIT/ML ~~LOC~~ SOPN: 52.0000 [IU] | PEN_INJECTOR | Freq: Every morning | SUBCUTANEOUS | 0 refills | Status: DC

## 2016-01-04 MED ORDER — INSULIN ASPART 100 UNIT/ML FLEXPEN: PEN_INJECTOR | SUBCUTANEOUS | 1 refills | Status: DC

## 2016-01-04 MED ORDER — METFORMIN HCL 1000 MG PO TABS: 1000.0000 mg | ORAL_TABLET | Freq: Two times a day (BID) | ORAL | 0 refills | Status: DC

## 2016-01-04 NOTE — Telephone Encounter (Signed)
Pt called in and said that she changed her pharmacy to [facility] She said they told her they have been trying to get in touch with [place] about her prescriptions.

## 2016-01-04 NOTE — Telephone Encounter (Signed)
rx's sent to [facility] per patient request.

## 2016-02-01 ENCOUNTER — Other Ambulatory Visit: Payer: Self-pay | Admitting: Endocrinology

## 2016-02-07 ENCOUNTER — Ambulatory Visit: Payer: Medicare Other | Admitting: Endocrinology

## 2016-02-08 ENCOUNTER — Ambulatory Visit (INDEPENDENT_AMBULATORY_CARE_PROVIDER_SITE_OTHER): Payer: Medicare Other | Admitting: Endocrinology

## 2016-02-08 ENCOUNTER — Encounter: Payer: Self-pay | Admitting: Endocrinology

## 2016-02-08 VITALS — BP 154/82 | HR 98 | Wt 243.0 lb

## 2016-02-08 DIAGNOSIS — I10 Essential (primary) hypertension: Secondary | ICD-10-CM | POA: Diagnosis not present

## 2016-02-08 DIAGNOSIS — Z794 Long term (current) use of insulin: Secondary | ICD-10-CM

## 2016-02-08 DIAGNOSIS — E1165 Type 2 diabetes mellitus with hyperglycemia: Secondary | ICD-10-CM

## 2016-02-08 MED ORDER — AMLODIPINE BESYLATE 5 MG PO TABS: 5.0000 mg | ORAL_TABLET | Freq: Every day | ORAL | 2 refills | Status: DC

## 2016-02-08 MED ORDER — OLMESARTAN MEDOXOMIL-HCTZ 40-12.5 MG PO TABS: 1.0000 | ORAL_TABLET | Freq: Every day | ORAL | 3 refills | Status: DC

## 2016-02-08 NOTE — Progress Notes (Signed)
Patient ID: Marissa Roberts, female   DOB: [birth year], 70 y.o.   MRN: [id number]           Reason for Appointment:  Follow-up for hypertension   History of Present Illness:      The blood pressure has been treated by various medications in the past and more recently has been taking Lotrel 10/40 Usually followed by PCP. Her blood pressure in the office has been consistently high She does not monitor at home She was told to start Tenex 1 mg daily on her last visit but she apparently could not get it from her pharmacy and did not call to let us know  She also has had difficulties with edema taking Lasix twice a day Recent readings:  BP Readings from Last 3 Encounters:  02/08/16 (!) 154/82  01/02/16 (!) 150/84  09/29/15 136/80            Diagnosis: Type 2 diabetes mellitus, date of diagnosis: 2005       Patient did bring her monitor for download but is checking only fasting readings which are overall fairly good with only occasional high readings  Following is a copy of the previous note:  Recent history:   INSULIN regimen is  Tresiba 52 units hs, Novolog 20 units ac tid   Oral hypoglycemic drugs the patient is taking are: Metformin 2 g daily  Her blood sugars appear to be gradually improving with A1c now 7.3, previously as high as 7.8 this year  Current blood sugar patterns, management and problems identified:  Her blood sugars are being checked in the mornings only again despite reminders to check postprandial readings  She does not understand the need to check blood sugars after meals  She has gone up on her NovoLog to 20 units on her own instead of 15  Again she does not adjust her mealtime insulin based on what she is eating  Only she is going out to eat she will take 5 more units  Blood sugars are fairly well controlled in the morning but appeared to be relatively higher in the last 10 days; sometimes will have more carbohydrates late at night or snacks  Not  going to exercise again despite reminders  Again has difficulty losing weight No hypoglycemia recently      Side effects from medications have been:none  Compliance with the medical regimen: Fair  Glucose monitoring:  done 1-2 time a day         Glucometer:  One Touch.      Blood Glucose readings from download  MEDIAN 119, range 88-182   Self-care: The diet that the patient has been following is: None    Meals: 3 meals per day. Breakfast is yogurt or toast, bacon, egg; usually not a lot of carbohydrates, rarely fried food including fish.    Not always restricting fat intake or sweet tea especially when eating out        Exercise:  some walking Dietician visit, most recent:2014.               Weight history:  Wt Readings from Last 3 Encounters:  02/08/16 243 lb (110.2 kg)  01/02/16 241 lb (109.3 kg)  09/29/15 243 lb (110.2 kg)    Glycemic control:   Lab Results  Component Value Date   HGBA1C 7.3 (H) 12/26/2015   HGBA1C 7.5 (H) 09/26/2015   HGBA1C 7.8 (H) 06/21/2015   Lab Results  Component Value Date   MICROALBUR 74.3 (  H) 03/22/2015   LDLCALC 93 09/26/2015   CREATININE 0.72 12/26/2015     Past history:  She thinks her blood sugars were only mildly increased at borderline levels at the onsetper Most likely she was treated with metformin initially and this has been continued.   At some point she was also tried on [place] and [place] for improving her control. She is not sure why she was started on insulin 5 years ago, presumably for worsening hyperglycemia Also not clear if she has tried various insulin regimens before starting Lantus and NovoLog Her A1c has been usually over 7% and gradually increasing over the last year Previously had tried the V-go pump but subsequently she stopped doing the V-go pump because of the cost, skin irritation and local discomfort     Medication List       Accurate as of 02/08/16  8:56 PM. Always use your most recent med list.            amLODipine 5 MG tablet Commonly known as:  NORVASC Take 1 tablet (5 mg total) by mouth daily.   CALCIUM 600 1500 (600 Ca) MG Tabs tablet Generic drug:  calcium carbonate Take 2 tablets by mouth daily.   cyanocobalamin 100 MCG tablet Take 100 mcg by mouth daily.   EASY TOUCH PEN NEEDLES 31G X 8 MM Misc Generic drug:  Insulin Pen Needle USE TO INJECT LANTUS AND NOVOLOG 4 TIMES PER DAY   EQ FIBER SUPPLEMENT PO Take by mouth.   fluticasone 50 MCG/ACT nasal spray Commonly known as:  FLONASE Place 2 sprays into both nostrils daily.   furosemide 40 MG tablet Commonly known as:  LASIX Take 2 tablets (80 mg total) by mouth daily.   glucosamine-chondroitin 500-400 MG tablet Take 1 tablet by mouth daily.   glucose blood test strip Commonly known as:  ONETOUCH VERIO Please check blood sugars at least half the time about 2 hours after any meal and 3 times per week on waking up. Dx code E11.9   insulin aspart 100 UNIT/ML FlexPen Commonly known as:  NOVOLOG FLEXPEN Inject  20 units before each meal   NOVOLOG FLEXPEN 100 UNIT/ML FlexPen Generic drug:  insulin aspart INJECT UP TO 20 UNITS INTO THE SKIN BEFORE EACH MEAL   Insulin Degludec 200 UNIT/ML Sopn Commonly known as:  TRESIBA FLEXTOUCH Inject 52 Units into the skin every morning.   levothyroxine 137 MCG tablet Commonly known as:  SYNTHROID, LEVOTHROID TAKE ONE TABLET BY MOUTH ONCE DAILY   LINZESS 290 MCG Caps capsule Generic drug:  linaclotide Take 1 capsule (290 mcg total) by mouth daily.   magnesium oxide 400 MG tablet Commonly known as:  MAG-OX Take 400 mg by mouth daily.   metFORMIN 1000 MG tablet Commonly known as:  GLUCOPHAGE Take 1 tablet (1,000 mg total) by mouth 2 (two) times daily with a meal.   olmesartan-hydrochlorothiazide 40-12.5 MG tablet Commonly known as:  BENICAR HCT Take 1 tablet by mouth daily.   ONETOUCH DELICA LANCETS FINE Misc Please check blood sugars at least half the time  about 2 hours after any meal and 3 times per week on waking up. Dx code E11.9   potassium chloride 10 MEQ tablet Commonly known as:  K-DUR Take 1 tablet (10 mEq total) by mouth 2 (two) times daily.   PROBIOTIC DAILY PO Take by mouth.   rosuvastatin 20 MG tablet Commonly known as:  CRESTOR Take 1 tablet (20 mg total) by mouth daily.   traMADol  50 MG tablet Commonly known as:  ULTRAM Take 1 tablet (50 mg total) by mouth every 12 (twelve) hours as needed.       Allergies:  Allergies  Allergen Reactions  . Contrast Media [Iodinated Diagnostic Agents]     Throat swells from ct con tra  . Adhesive [Tape]     No paper tape, causes blisters  . Erythromycin     REACTION: rash  . Penicillins     REACTION: rash    Past Medical History:  Diagnosis Date  . Arthritis   . Bronchitis    hx of  . CHF (congestive heart failure) ([place])   . Diabetes mellitus   . GERD (gastroesophageal reflux disease)   . Hypercholesteremia   . Hypertension   . Hypothyroidism   . Pneumonia    hx of  . PONV (postoperative nausea and vomiting)   . Thyroid disease     Past Surgical History:  Procedure Laterality Date  . ABDOMINAL HYSTERECTOMY     partial  . BREAST SURGERY     lumpectomy left breast  . carpel tunnel    . COLONOSCOPY W/ POLYPECTOMY    . FOOT SURGERY    . knee arhtroscopy    . SHOULDER ARTHROSCOPY WITH SUBACROMIAL DECOMPRESSION, ROTATOR CUFF REPAIR AND BICEP TENDON REPAIR Right 05/26/2012   Procedure: Right Shoulder Diagnostic Operative Arthroscopy, Subacromial Decompression, Biceps Tenodesis, Mini Open Rotator Cuff Repair;  Surgeon: [name], MD;  Location: [facility];  Service: Orthopedics;  Laterality: Right;  Right Shoulder Diagnostic Operative Arthroscopy, Subacromial Decompression, Biceps Tenodesis, Mini Open Rotator Cuff Repair  . TONSILLECTOMY      Family History  Problem Relation Age of Onset  . Adopted: Yes  . Coronary artery disease    . Diabetes Mother   .  Heart disease Mother   . Hyperlipidemia Mother     Social History:  reports that she quit smoking about 25 years ago. She has never used smokeless tobacco. She reports that she does not drink alcohol or use drugs.    Review of Systems   The following is a copy of the previous note:  Most recent eye exam was in 12/2014    Lipids: followed by PCP. Been taking Crestor 20 mg, previously LDL was high with simvastatin 20 mg LDL below 100        Lab Results  Component Value Date   CHOL 174 09/26/2015   HDL 56.00 09/26/2015   LDLCALC 93 09/26/2015   LDLDIRECT 129.5 02/06/2012   TRIG 125.0 09/26/2015   CHOLHDL 3 09/26/2015                  Thyroid:     Apparently has been hypothyroid for about 40 years and is taking a supplement of 137 g levothyroxine with consistent control   Lab Results  Component Value Date   TSH 1.27 09/26/2015   TSH 0.42 11/11/2014   TSH 0.52 05/26/2014   FREET4 1.03 09/26/2015   FREET4 1.03 06/28/2010   FREET4 1.36 11/27/2007      She still takes some meloxicam  as needed  EDEMA: She takes Lasix 80 mg daily with control of swelling but may occasionally take an extra 40 mg  Physical Examination:  BP (!) 154/82 (Cuff Size: Large)   Pulse 98   Wt 243 lb (110.2 kg)   SpO2 96%   BMI 40.44 kg/m     she has 2+ lower leg and pedal edema which appears to  be more on the right  ASSESSMENT/PLAN:    HYPERTENSION: Not adequately controlled Although she was told to try Tenex on her last visit for persistent hypertension she apparently could not get this through her pharmacy Blood pressure is still about the same  Although she is insisting on trying only a single combination drug discussed that we need to reduce her amlodipine to reduce her edema and also had HCTZ which will be most convenient with  using Benicar HCTZ   She will use 5 mg of amlodipine and 40/12.5 of Benicar HCT  Patient Instructions  Stop Lotrel, 2 new Rx  More sugars after  meals   Amogh Komatsu 02/08/2016, 8:56 PM   Note: This office note was prepared with Dragon voice recognition system technology. Any transcriptional errors that result from this process are unintentional.

## 2016-02-08 NOTE — Patient Instructions (Addendum)
Stop Lotrel, 2 new Rx  More sugars after meals

## 2016-02-15 DIAGNOSIS — H40023 Open angle with borderline findings, high risk, bilateral: Secondary | ICD-10-CM | POA: Diagnosis not present

## 2016-03-05 ENCOUNTER — Other Ambulatory Visit: Payer: Self-pay | Admitting: Internal Medicine

## 2016-03-05 ENCOUNTER — Telehealth: Payer: Self-pay | Admitting: Endocrinology

## 2016-03-05 DIAGNOSIS — E039 Hypothyroidism, unspecified: Secondary | ICD-10-CM

## 2016-03-05 NOTE — Telephone Encounter (Signed)
olmesartan is too expensive with her insurance is there an alternate

## 2016-03-06 ENCOUNTER — Other Ambulatory Visit: Payer: Self-pay

## 2016-03-06 MED ORDER — AMLODIPINE-VALSARTAN-HCTZ 10-160-12.5 MG PO TABS: ORAL_TABLET | ORAL | 2 refills | Status: DC

## 2016-03-06 NOTE — Telephone Encounter (Signed)
Patient stated medication was not sen to the pharmacy  Valsartan HCT 160/12.5  Mg daily  Send to [facility].

## 2016-03-06 NOTE — Telephone Encounter (Signed)
Sent 03/06/16

## 2016-03-06 NOTE — Telephone Encounter (Signed)
Change to valsartan HCT 160/12.5 mg daily

## 2016-03-16 ENCOUNTER — Other Ambulatory Visit: Payer: Self-pay | Admitting: Endocrinology

## 2016-04-05 ENCOUNTER — Other Ambulatory Visit (INDEPENDENT_AMBULATORY_CARE_PROVIDER_SITE_OTHER): Payer: Medicare Other

## 2016-04-05 DIAGNOSIS — E1165 Type 2 diabetes mellitus with hyperglycemia: Secondary | ICD-10-CM

## 2016-04-05 DIAGNOSIS — Z794 Long term (current) use of insulin: Secondary | ICD-10-CM | POA: Diagnosis not present

## 2016-04-05 LAB — COMPREHENSIVE METABOLIC PANEL
ALK PHOS: 64 U/L (ref 39–117)
ALT: 18 U/L (ref 0–35)
AST: 16 U/L (ref 0–37)
Albumin: 4 g/dL (ref 3.5–5.2)
BILIRUBIN TOTAL: 0.3 mg/dL (ref 0.2–1.2)
BUN: 10 mg/dL (ref 6–23)
CALCIUM: 9.6 mg/dL (ref 8.4–10.5)
CO2: 29 meq/L (ref 19–32)
Chloride: 103 mEq/L (ref 96–112)
Creatinine, Ser: 0.77 mg/dL (ref 0.40–1.20)
GFR: 95.7 mL/min (ref >60.00)
Glucose, Bld: 176 mg/dL — ABNORMAL HIGH (ref 70–99)
POTASSIUM: 3.7 meq/L (ref 3.5–5.1)
Sodium: 139 mEq/L (ref 135–145)
Total Protein: 7.1 g/dL (ref 6.0–8.3)

## 2016-04-05 LAB — MICROALBUMIN / CREATININE URINE RATIO
CREATININE, U: 108.1 mg/dL
MICROALB/CREAT RATIO: 57.2 mg/g — AB (ref 0.0–30.0)
Microalb, Ur: 61.8 mg/dL — ABNORMAL HIGH (ref 0.0–1.9)

## 2016-04-05 LAB — HEMOGLOBIN A1C: HEMOGLOBIN A1C: 7.9 % — AB (ref 4.6–6.5)

## 2016-04-08 DIAGNOSIS — Z01419 Encounter for gynecological examination (general) (routine) without abnormal findings: Secondary | ICD-10-CM | POA: Diagnosis not present

## 2016-04-08 DIAGNOSIS — Z1231 Encounter for screening mammogram for malignant neoplasm of breast: Secondary | ICD-10-CM | POA: Diagnosis not present

## 2016-04-08 DIAGNOSIS — Z6841 Body Mass Index (BMI) 40.0 and over, adult: Secondary | ICD-10-CM | POA: Diagnosis not present

## 2016-04-08 DIAGNOSIS — Z124 Encounter for screening for malignant neoplasm of cervix: Secondary | ICD-10-CM | POA: Diagnosis not present

## 2016-04-10 ENCOUNTER — Ambulatory Visit (INDEPENDENT_AMBULATORY_CARE_PROVIDER_SITE_OTHER): Payer: Medicare Other | Admitting: Endocrinology

## 2016-04-10 ENCOUNTER — Encounter: Payer: Self-pay | Admitting: Endocrinology

## 2016-04-10 VITALS — BP 130/70 | HR 85 | Ht 65.0 in | Wt 242.0 lb

## 2016-04-10 DIAGNOSIS — M25471 Effusion, right ankle: Secondary | ICD-10-CM

## 2016-04-10 DIAGNOSIS — E1165 Type 2 diabetes mellitus with hyperglycemia: Secondary | ICD-10-CM | POA: Diagnosis not present

## 2016-04-10 DIAGNOSIS — I10 Essential (primary) hypertension: Secondary | ICD-10-CM

## 2016-04-10 DIAGNOSIS — Z794 Long term (current) use of insulin: Secondary | ICD-10-CM

## 2016-04-10 DIAGNOSIS — M25472 Effusion, left ankle: Secondary | ICD-10-CM

## 2016-04-10 NOTE — Progress Notes (Signed)
Patient ID: Marissa Roberts, female   DOB: 1945-09-11, 71 y.o.   MRN: [id number]           Reason for Appointment:  Follow-up for diabetes and hypertension   History of Present Illness:    HYPERTENSION  The blood pressure has been treated by various medications in the past and more recently has been taking Lotrel 10/40 Previously followed by PCP. She does not monitor at home  Although she was supposed to start on Benicar HCT this apparently was not covered and she was given valsartan HCTZ.  Not clear why she got the combination of Diovan HCT and amlodipine and she is also taking 5 mg amlodipine separately Pressure is improved now  She also has had problems chronically with edema, taking Lasix twice a day She still has a little swelling Recent readings:  BP Readings from Last 3 Encounters:  04/10/16 130/70  02/08/16 (!) 154/82  01/02/16 (!) 150/84            Type 2 diabetes mellitus, date of diagnosis: 2005        Recent history:   INSULIN regimen is  Tresiba 52 units hs, Novolog 20 units  tid   Oral hypoglycemic drugs the patient is taking are: Metformin 2 g daily  Her blood sugars appear to be Overall worse with A1c now 7.9, previously 7.3  Current blood sugar patterns, management and problems identified:  Her blood sugars are being checked in the mornings only again despite reminders to check postprandial readings  She had previously used the V-go pump but she had practical difficulties with this and it was too expensive  Blood sugars in the morning are fluctuating very significantly  She now admits that she is not able to take her insulin with her when she is eating out and usually is eating 1 meal a day at a restaurant.  She takes the same amount of Novolog regardless of her meal.  Also when she is eating out she will take it when she comes back home.  She is very compliant with her Tyler Aas every night  Not trying to exercise again despite reminders  Again  has difficulty losing weight No hypoglycemia       Side effects from medications have been:none  Compliance with the medical regimen: Fair  Glucose monitoring:  done 1-2 time a day         Glucometer:  One Touch.      Blood Glucose readings from download, has all morning readings  MEDIAN 147 with range 104-276    Self-care: The diet that the patient has been following is: None    Meals: 3 meals per day. Breakfast is yogurt or toast, bacon, egg; usually not a lot of carbohydrates, rarely fried food including fish.    Not always restricting fat intake or sweet tea sometimes when eating out        Exercise:  no walking recently Dietician visit, most recent:2014.               Weight history:  Wt Readings from Last 3 Encounters:  04/10/16 242 lb (109.8 kg)  02/08/16 243 lb (110.2 kg)  01/02/16 241 lb (109.3 kg)    Glycemic control:   Lab Results  Component Value Date   HGBA1C 7.9 (H) 04/05/2016   HGBA1C 7.3 (H) 12/26/2015   HGBA1C 7.5 (H) 09/26/2015   Lab Results  Component Value Date   MICROALBUR 61.8 (H) 04/05/2016   LDLCALC 93 09/26/2015  CREATININE 0.77 04/05/2016     Past history:  She thinks her blood sugars were only mildly increased at borderline levels at the onsetper Most likely she was treated with metformin initially and this has been continued.   At some point she was also tried on [place] and By[place] for improving her control. She is not sure why she was started on insulin 5 years ago, presumably for worsening hyperglycemia Also not clear if she has tried various insulin regimens before starting Lantus and NovoLog Her A1c has been usually over 7% and gradually increasing over the last year Previously had tried the V-go pump but subsequently she stopped doing the V-go pump because of the cost, skin irritation and local discomfort   Allergies as of 04/10/2016      Reactions   Contrast Media [iodinated Diagnostic Agents]    Throat swells from ct  con tra   Adhesive [tape]    No paper tape, causes blisters   Erythromycin    REACTION: rash   Penicillins    REACTION: rash      Medication List       Accurate as of 04/10/16  9:26 AM. Always use your most recent med list.          Amlodipine-Valsartan-HCTZ 10-160-12.5 MG Tabs Take 1 tablet daily   CALCIUM 600 1500 (600 Ca) MG Tabs tablet Generic drug:  calcium carbonate Take 2 tablets by mouth daily.   cyanocobalamin 100 MCG tablet Take 100 mcg by mouth daily.   EASY TOUCH PEN NEEDLES 31G X 8 MM Misc Generic drug:  Insulin Pen Needle USE TO INJECT LANTUS AND NOVOLOG 4 TIMES PER DAY   EQ FIBER SUPPLEMENT PO Take by mouth.   fluticasone 50 MCG/ACT nasal spray Commonly known as:  FLONASE Place 2 sprays into both nostrils daily.   furosemide 40 MG tablet Commonly known as:  LASIX Take 2 tablets (80 mg total) by mouth daily.   glucosamine-chondroitin 500-400 MG tablet Take 1 tablet by mouth daily.   glucose blood test strip Commonly known as:  ONETOUCH VERIO Please check blood sugars at least half the time about 2 hours after any meal and 3 times per week on waking up. Dx code E11.9   insulin aspart 100 UNIT/ML FlexPen Commonly known as:  NOVOLOG FLEXPEN Inject  20 units before each meal   NOVOLOG FLEXPEN 100 UNIT/ML FlexPen Generic drug:  insulin aspart INJECT UP TO 20 UNITS INTO THE SKIN BEFORE EACH MEAL   Insulin Degludec 200 UNIT/ML Sopn Commonly known as:  TRESIBA FLEXTOUCH Inject 52 Units into the skin every morning.   levothyroxine 137 MCG tablet Commonly known as:  SYNTHROID, LEVOTHROID TAKE ONE TABLET BY MOUTH ONCE DAILY   levothyroxine 137 MCG tablet Commonly known as:  SYNTHROID, LEVOTHROID TAKE ONE TABLET BY MOUTH ONCE DAILY   LINZESS 290 MCG Caps capsule Generic drug:  linaclotide Take 1 capsule (290 mcg total) by mouth daily.   magnesium oxide 400 MG tablet Commonly known as:  MAG-OX Take 400 mg by mouth daily.   metFORMIN 1000  MG tablet Commonly known as:  GLUCOPHAGE Take 1 tablet (1,000 mg total) by mouth 2 (two) times daily with a meal.   ONETOUCH DELICA LANCETS FINE Misc Please check blood sugars at least half the time about 2 hours after any meal and 3 times per week on waking up. Dx code E11.9   potassium chloride 10 MEQ tablet Commonly known as:  K-DUR Take 1 tablet (10 mEq  total) by mouth 2 (two) times daily.   PROBIOTIC DAILY PO Take by mouth.   rosuvastatin 20 MG tablet Commonly known as:  CRESTOR Take 1 tablet (20 mg total) by mouth daily.   rosuvastatin 20 MG tablet Commonly known as:  CRESTOR TAKE 1 TABLET (20 MG TOTAL) BY MOUTH DAILY.   traMADol 50 MG tablet Commonly known as:  ULTRAM Take 1 tablet (50 mg total) by mouth every 12 (twelve) hours as needed.       Allergies:  Allergies  Allergen Reactions  . Contrast Media [Iodinated Diagnostic Agents]     Throat swells from ct con tra  . Adhesive [Tape]     No paper tape, causes blisters  . Erythromycin     REACTION: rash  . Penicillins     REACTION: rash    Past Medical History:  Diagnosis Date  . Arthritis   . Bronchitis    hx of  . CHF (congestive heart failure) ([place])   . Diabetes mellitus   . GERD (gastroesophageal reflux disease)   . Hypercholesteremia   . Hypertension   . Hypothyroidism   . Pneumonia    hx of  . PONV (postoperative nausea and vomiting)   . Thyroid disease     Past Surgical History:  Procedure Laterality Date  . ABDOMINAL HYSTERECTOMY     partial  . BREAST SURGERY     lumpectomy left breast  . carpel tunnel    . COLONOSCOPY W/ POLYPECTOMY    . FOOT SURGERY    . knee arhtroscopy    . SHOULDER ARTHROSCOPY WITH SUBACROMIAL DECOMPRESSION, ROTATOR CUFF REPAIR AND BICEP TENDON REPAIR Right 05/26/2012   Procedure: Right Shoulder Diagnostic Operative Arthroscopy, Subacromial Decompression, Biceps Tenodesis, Mini Open Rotator Cuff Repair;  Surgeon: [name], MD;  Location: [facility];   Service: Orthopedics;  Laterality: Right;  Right Shoulder Diagnostic Operative Arthroscopy, Subacromial Decompression, Biceps Tenodesis, Mini Open Rotator Cuff Repair  . TONSILLECTOMY      Family History  Problem Relation Age of Onset  . Adopted: Yes  . Coronary artery disease    . Diabetes Mother   . Heart disease Mother   . Hyperlipidemia Mother     Social History:  reports that she quit smoking about 26 years ago. She has never used smokeless tobacco. She reports that she does not drink alcohol or use drugs.    Review of Systems    Most recent eye exam was in 12/2014    LIPIDS: followed by PCP. Still taking Crestor 20 mg, previously LDL was high with simvastatin 20 mg LDL below 100        Lab Results  Component Value Date   CHOL 174 09/26/2015   HDL 56.00 09/26/2015   LDLCALC 93 09/26/2015   LDLDIRECT 129.5 02/06/2012   TRIG 125.0 09/26/2015   CHOLHDL 3 09/26/2015                  Thyroid:     She has been hypothyroid for about 40 years and is taking a supplement of 137 g levothyroxine with The following labs   Lab Results  Component Value Date   TSH 1.27 09/26/2015   TSH 0.42 11/11/2014   TSH 0.52 05/26/2014   FREET4 1.03 09/26/2015   FREET4 1.03 06/28/2010   FREET4 1.36 11/27/2007       Physical Examination:  BP 130/70   Pulse 85   Ht 5\' 5"  (1.651 m)   Wt 242 lb (109.8  kg)   SpO2 96%   BMI 40.27 kg/m     she has 1+ lower leg and pedal edema, More on the right  Diabetic Foot Exam - Simple   Simple Foot Form Diabetic Foot exam was performed with the following findings:  Yes 04/10/2016  9:19 AM  Visual Inspection No deformities, no ulcerations, no other skin breakdown bilaterally:  Yes Sensation Testing Intact to touch and monofilament testing bilaterally:  Yes Pulse Check Posterior Tibialis and Dorsalis pulse intact bilaterally:  Yes Comments     ASSESSMENT/PLAN:  DIABETES:  See history of present illness for detailed discussion of  current diabetes management, blood sugar patterns and problems identified  She again tends to have high A1c related to postprandial hyperglycemia Difficult to have her comply with taking her mealtime insulin before eating She is not wanting to go back to the V-go pump Emphasized the need to have her NovoLog with her when she is going out to eat She may also need to adjust the dose based on postprandial patterns. She does need to start checking blood sugars at bedtime at least to document how high her sugars are with various meals If she is not able to do enough postprandial readings will consider the professional freestyle [place] recording  We'll need to have her follow-up with nurse educator to ensure that she is compliant with the changes Also can fine-tune her diet, needs to be more particular about simple sugars and high-fat foods especially with eating out   HYPERTENSION: adequately controlled with triple drug regimen of amlodipine/valsartan/HCT Apparently was not able to get Benicar HCT as recommended to will need to stop the additional amlodipine she is taking Continue Lasix for edema Edema may improve with stopping extra amlodipine  MICROALBUMINURIA: Relatively better, needs consistent control of diabetes and hypertension  HYPOTHYROIDISM: Will need follow-up TSH on next visit  Counseling time on subjects discussed above is over 50% of today's 25 minute visit   Patient Instructions  Check sugar when taking Tresiba  Must take Novolog before each meal  Do  Not take amlodipine  INDOOR EXERCISE IDEAS   Use the following examples for a creative indoor workout (perform each move for 2-3 minutes):   Warm up. Put on some music that makes you feel like moving, and dance around the living room.  Watch exercise shows on TV and move along with them. There are tons of free cable channels that have daily exercise shows on them for all levels - beginner through advanced.   You can  easily find a number of exercise videos but use one that will suit your liking and exercise level; you can do these on your own schedule.  Walk up and down the steps.  Do dumbbell curls and presses (if you don't have weights, use full water bottles).  Do assisted squats, keeping your back on a fitness ball against the wall or using the back of the couch for support.  Shadow box: Lift and lower the left leg; jab with the right arm, then the left; then lift and lower the right leg.  Fence (you don't even need swords). Pretend you're holding a sword in each hand. Create an X pattern standing still, then moving forward and back.  Hop on your exercise bike or treadmill -- or, for something different, use a weighted hula hoop. If you don't have any of those, just go back to dancing.  Do abdominal crunches (hold a weighted ball for added resistance).  Cool  down with Rich Reining "I Feel Good" -- or whatever tune makes you feel good    Gema Ringold 04/10/2016, 9:26 AM   Note: This office note was prepared with Dragon voice recognition system technology. Any transcriptional errors that result from this process are unintentional.

## 2016-04-10 NOTE — Patient Instructions (Addendum)
Check sugar when taking Tresiba  Must take Novolog before each meal  Do  Not take amlodipine  INDOOR EXERCISE IDEAS   Use the following examples for a creative indoor workout (perform each move for 2-3 minutes):   Warm up. Put on some music that makes you feel like moving, and dance around the living room.  Watch exercise shows on TV and move along with them. There are tons of free cable channels that have daily exercise shows on them for all levels - beginner through advanced.   You can easily find a number of exercise videos but use one that will suit your liking and exercise level; you can do these on your own schedule.  Walk up and down the steps.  Do dumbbell curls and presses (if you don't have weights, use full water bottles).  Do assisted squats, keeping your back on a fitness ball against the wall or using the back of the couch for support.  Shadow box: Lift and lower the left leg; jab with the right arm, then the left; then lift and lower the right leg.  Fence (you don't even need swords). Pretend you're holding a sword in each hand. Create an X pattern standing still, then moving forward and back.  Hop on your exercise bike or treadmill -- or, for something different, use a weighted hula hoop. If you don't have any of those, just go back to dancing.  Do abdominal crunches (hold a weighted ball for added resistance).  Cool down with [employer] "I Feel Good" -- or whatever tune makes you feel good

## 2016-04-11 ENCOUNTER — Other Ambulatory Visit: Payer: Self-pay | Admitting: Endocrinology

## 2016-05-09 ENCOUNTER — Telehealth: Payer: Self-pay | Admitting: Endocrinology

## 2016-05-09 NOTE — Telephone Encounter (Signed)
Amlodipine is not available to be distributed, can we please call in the alternate to [facility]

## 2016-05-09 NOTE — Telephone Encounter (Signed)
She is on the 3 drug combination, this will be changed to the following: Amlodipine 10 mg daily, valsartan HCT 160/12.5 mg daily

## 2016-05-13 ENCOUNTER — Telehealth: Payer: Self-pay | Admitting: Endocrinology

## 2016-05-13 NOTE — Telephone Encounter (Signed)
Patient is returning your call, lisa stated to send calls to you and julie

## 2016-05-13 NOTE — Telephone Encounter (Signed)
I contacted the patient and advised of message via voicemail. When I spoke previously with the patient she advised me she has a 30 day supply of her medication and is not in need of a prescription at this time. I advised the patient to call back and let us know if she is ok with the medication change before we sent the prescription's into the pharmacy.

## 2016-05-13 NOTE — Telephone Encounter (Signed)
Please see previous message about amlodipine separately and valsartan HCT separately

## 2016-05-13 NOTE — Telephone Encounter (Signed)
I contacted the patient. She stated she was able to pick up amlodpine-valsartan-HCTZ for a 30 day supply but on 05/09/2016 when she called the pharmacy did not have the medication. Patient stated she has problems every month getting this medication and wanted to know if a alternative medication could be submitted? Please advise, Thanks!

## 2016-05-15 ENCOUNTER — Other Ambulatory Visit: Payer: Self-pay

## 2016-05-15 ENCOUNTER — Encounter: Payer: Medicare Other | Attending: Endocrinology | Admitting: Nutrition

## 2016-05-15 DIAGNOSIS — Z713 Dietary counseling and surveillance: Secondary | ICD-10-CM | POA: Insufficient documentation

## 2016-05-15 DIAGNOSIS — Z794 Long term (current) use of insulin: Secondary | ICD-10-CM | POA: Diagnosis not present

## 2016-05-15 DIAGNOSIS — E1165 Type 2 diabetes mellitus with hyperglycemia: Secondary | ICD-10-CM | POA: Diagnosis not present

## 2016-05-15 DIAGNOSIS — E1100 Type 2 diabetes mellitus with hyperosmolarity without nonketotic hyperglycemic-hyperosmolar coma (NKHHC): Secondary | ICD-10-CM

## 2016-05-15 MED ORDER — AMLODIPINE BESYLATE 10 MG PO TABS: 10.0000 mg | ORAL_TABLET | Freq: Every day | ORAL | 3 refills | Status: DC

## 2016-05-15 MED ORDER — VALSARTAN-HYDROCHLOROTHIAZIDE 160-12.5 MG PO TABS: 1.0000 | ORAL_TABLET | Freq: Every day | ORAL | 2 refills | Status: DC

## 2016-05-20 ENCOUNTER — Other Ambulatory Visit: Payer: Self-pay

## 2016-05-20 MED ORDER — GLUCOSE BLOOD VI STRP: ORAL_STRIP | 2 refills | Status: DC

## 2016-05-20 MED ORDER — INSULIN PEN NEEDLE 31G X 8 MM MISC: 2 refills | Status: DC

## 2016-05-23 NOTE — Progress Notes (Signed)
Pt. Did not bring meter.   She reports that she often does not take her Novolog before her supper meal,  because she goes out to eat after work.  She will then take it when she gets home.  Also does not always take it acL.  She was encouraged to put a Novolog pen in her purse with pen needles and she was told that it would last for 30 days out of the refrigerator.  She agreed to do this. She also reports that she does not always take her [place] Aas, because she forgets at bedtime.  She was told to leave it at her bedside to remind her.     We discussed the importance of taking the [place], and that it was half of her daily dose of insulin.  She agreed to try harder to take this. We also discussed the need to adjust the amount of Novolog she takes before each meal, depending on the amount of carbs she eats, as well as what her blood sugar is before the meal.  We discussed what carbs were, and the fact that she may need to add 2-4 units if eating more than her normal amounts, or having a desert, like on Sundays.  She agreed to do this.  We also discussed the need to take 2 extra units when her blood sugars are over 200 before the meal.  She reported good understanding of this and had no final questions.

## 2016-06-06 ENCOUNTER — Ambulatory Visit (INDEPENDENT_AMBULATORY_CARE_PROVIDER_SITE_OTHER): Payer: Medicare Other | Admitting: Orthopedic Surgery

## 2016-06-06 ENCOUNTER — Encounter (INDEPENDENT_AMBULATORY_CARE_PROVIDER_SITE_OTHER): Payer: Self-pay | Admitting: Orthopedic Surgery

## 2016-06-06 ENCOUNTER — Ambulatory Visit (INDEPENDENT_AMBULATORY_CARE_PROVIDER_SITE_OTHER): Payer: Medicare Other

## 2016-06-06 VITALS — Ht 65.0 in | Wt 242.0 lb

## 2016-06-06 DIAGNOSIS — M25571 Pain in right ankle and joints of right foot: Secondary | ICD-10-CM

## 2016-06-06 DIAGNOSIS — S93411A Sprain of calcaneofibular ligament of right ankle, initial encounter: Secondary | ICD-10-CM

## 2016-06-06 NOTE — Progress Notes (Signed)
Office Visit Note   Patient: Marissa Roberts           Date of Birth: [birth year]           MRN: [id number] Visit Date: 06/06/2016              Requested by: [name], MD [place], [place] [id number] PCP: [name], MD  Chief Complaint  Patient presents with  . Right Ankle - Pain    S/p twist injury about 2 -3 weeks ago.     HPI: Pt states tht several weeks ago she slipped while getting out of a truck in the mud and twisted her right ankle. She complains of painful weight bearing and swelling that is now "spreading to my toes" she is full weightbearing in a regular slide on shoe. Pamella Pert, RMA    Assessment & Plan: Visit Diagnoses:  1. Pain in right ankle and joints of right foot   2. Sprain of calcaneofibular ligament of right ankle, initial encounter     Plan: We'll place patient in a essential wear this for 4 weeks. If she is still symptomatic she will follow-up at that time  Follow-Up Instructions: Return if symptoms worsen or fail to improve.   Ortho Exam On examination patient is alert oriented no adenopathy well-dressed normal affect normal respiratory effort she does have an antalgic gait. Examination she has good dorsalis pedis pulse. The deltoid ligament is nontender to palpation anterior drawer has about 2 mm of anterior laxity. She is point tender to palpation of the anterior talofibular ligament. Varus stress reproduces her pain. The syndesmosis is nontender to palpation the proximal tibia and fibula are nontender to palpation. ROS: Complete review of systems other than as noted in the history of present illness is negative. 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 There is no evidence of a fracture of the mortise is congruent.   Labs: Lab Results  Component Value Date   HGBA1C 7.9 (H) 04/05/2016   HGBA1C 7.3 (H) 12/26/2015   HGBA1C 7.5 (H) 09/26/2015   ESRSEDRATE 31 (H) 11/16/2014   ESRSEDRATE 27 (H) 01/12/2009    Orders:  Orders Placed This Encounter  Procedures  . XR Ankle Complete Right   No orders of the defined types were placed in this encounter.    Procedures: No procedures performed  Clinical Data: No additional findings.  Subjective: Review of Systems  Objective: Vital Signs: Ht 5\' 5"  (1.651 m)   Wt 242 lb (109.8 kg)   BMI 40.27 kg/m   Specialty Comments:  No specialty comments available.  PMFS History: Patient Active Problem List   Diagnosis Date Noted  . Sprain of calcaneofibular ligament of right ankle 06/06/2016  . Thyroid activity decreased 03/22/2015  . Routine general medical examination at a health care facility 02/07/2015  . Headache 11/18/2014  . Obesity (BMI 30-39.9) 04/19/2013  . Hyperlipidemia 07/28/2009  . Osteopenia 03/14/2009  . MYALGIA 01/12/2009  . Diabetes mellitus type 2, uncontrolled, with complications ([place]) 30/09/6224  . Hypothyroidism 09/24/2006  . Essential hypertension 09/24/2006   Past Medical History:  Diagnosis Date  . Arthritis   . Bronchitis    hx of  . CHF (congestive heart failure) ([place])   . Diabetes mellitus   . GERD (gastroesophageal reflux disease)   . Hypercholesteremia   . Hypertension   .  Hypothyroidism   . Pneumonia    hx of  . PONV (postoperative nausea and vomiting)   . Thyroid disease     Family History  Problem Relation Age of Onset  . Adopted: Yes  . Coronary artery disease    . Diabetes Mother   . Heart disease Mother   . Hyperlipidemia Mother     Past Surgical History:  Procedure Laterality Date  . ABDOMINAL HYSTERECTOMY     partial  . BREAST SURGERY     lumpectomy left breast  . carpel tunnel    . COLONOSCOPY W/ POLYPECTOMY    . FOOT SURGERY    . knee arhtroscopy    . SHOULDER ARTHROSCOPY WITH SUBACROMIAL DECOMPRESSION, ROTATOR CUFF REPAIR AND  BICEP TENDON REPAIR Right 05/26/2012   Procedure: Right Shoulder Diagnostic Operative Arthroscopy, Subacromial Decompression, Biceps Tenodesis, Mini Open Rotator Cuff Repair;  Surgeon: [name], MD;  Location: [facility];  Service: Orthopedics;  Laterality: Right;  Right Shoulder Diagnostic Operative Arthroscopy, Subacromial Decompression, Biceps Tenodesis, Mini Open Rotator Cuff Repair  . TONSILLECTOMY     Social History   Occupational History  . Not on file.   Social History Main Topics  . Smoking status: Former Smoker    Quit date: 04/01/1990  . Smokeless tobacco: Never Used  . Alcohol use No  . Drug use: No  . Sexual activity: No

## 2016-06-14 ENCOUNTER — Other Ambulatory Visit: Payer: Self-pay | Admitting: Internal Medicine

## 2016-06-21 DIAGNOSIS — K219 Gastro-esophageal reflux disease without esophagitis: Secondary | ICD-10-CM | POA: Diagnosis not present

## 2016-06-21 DIAGNOSIS — Z86010 Personal history of colonic polyps: Secondary | ICD-10-CM | POA: Diagnosis not present

## 2016-06-21 DIAGNOSIS — K2270 Barrett's esophagus without dysplasia: Secondary | ICD-10-CM | POA: Diagnosis not present

## 2016-06-21 DIAGNOSIS — K625 Hemorrhage of anus and rectum: Secondary | ICD-10-CM | POA: Diagnosis not present

## 2016-06-26 DIAGNOSIS — H40023 Open angle with borderline findings, high risk, bilateral: Secondary | ICD-10-CM | POA: Diagnosis not present

## 2016-07-04 ENCOUNTER — Ambulatory Visit (INDEPENDENT_AMBULATORY_CARE_PROVIDER_SITE_OTHER): Payer: Medicare Other | Admitting: Orthopedic Surgery

## 2016-07-04 DIAGNOSIS — S93411D Sprain of calcaneofibular ligament of right ankle, subsequent encounter: Secondary | ICD-10-CM

## 2016-07-04 NOTE — Progress Notes (Signed)
Office Visit Note   Patient: Marissa Roberts           Date of Birth: 09-15-1945           MRN: [id number] Visit Date: 07/04/2016              Requested by: [name], MD [place], [place] [address] PCP: [name], MD  No chief complaint on file.     HPI: Patient is a 71 year old woman who presents today in follow-up for right ankle sprain. She has been full weightbearing in regular shoe wear without difficulty. Was given an ASO at her last visit. She wore this for a couple days file it is too large and not very supportive and stopped wearing it. Continues to complain of swelling over the lateral ankle is associated with pain. And does worsen over the course of the day. Has not been taking any ibuprofen or Aleve. States she is diabetic and has been told not to take these however does take some Tylenol which relieves her pain moderately well.  Assessment & Plan: Visit Diagnoses:  1. Sprain of calcaneofibular ligament of right ankle, subsequent encounter     Plan: ankle wrapped with an Ace wrap today for support and compression. She'll continue to use this daily. Recommended she elevate and use ice for swelling. May continue Tylenol as needed.  Follow-Up Instructions: Return in about 4 weeks (around 08/01/2016), or if symptoms worsen or fail to improve.   Physical Exam  Constitutional: Appears well-developed.  Head: Normocephalic.  Eyes: EOM are normal.  Neck: Normal range of motion.  Cardiovascular: Normal rate.   Pulmonary/Chest: Effort normal.  Neurological: Is alert.  Skin: Skin is warm.  Psychiatric: Has a normal mood and affect.  Right Ankle Exam  Swelling: moderate  Tenderness  The patient is experiencing tenderness in the ATF and CF.    Tests  Anterior drawer: negative Varus tilt: negative  Other  Erythema: absent Pulse: present         Imaging: No results found.  Labs: Lab Results  Component Value Date   HGBA1C 7.9 (H) 04/05/2016   HGBA1C 7.3 (H) 12/26/2015   HGBA1C 7.5 (H) 09/26/2015   ESRSEDRATE 31 (H) 11/16/2014   ESRSEDRATE 27 (H) 01/12/2009    Orders:  No orders of the defined types were placed in this encounter.  No orders of the defined types were placed in this encounter.    Procedures: No procedures performed  Clinical Data: No additional findings.  ROS:  Review of Systems  Constitutional: Negative for chills and fever.  Cardiovascular: Negative for leg swelling.  Musculoskeletal: Positive for arthralgias, joint swelling and myalgias.    Objective: Vital Signs: There were no vitals taken for this visit.  Specialty Comments:  No specialty comments available.  PMFS History: Patient Active Problem List   Diagnosis Date Noted  . Sprain of calcaneofibular ligament of right ankle 06/06/2016  . Thyroid activity decreased 03/22/2015  . Routine general medical examination at a health care facility 02/07/2015  . Headache 11/18/2014  . Obesity (BMI 30-39.9) 04/19/2013  . Hyperlipidemia 07/28/2009  . Osteopenia 03/14/2009  . MYALGIA 01/12/2009  . Diabetes mellitus type 2, uncontrolled, with complications ([place]) 40/10/6759  . Hypothyroidism 09/24/2006  . Essential hypertension 09/24/2006   Past Medical History:  Diagnosis Date  . Arthritis   . Bronchitis    hx of  . CHF (congestive heart failure) ([place])   . Diabetes mellitus   .  GERD (gastroesophageal reflux disease)   . Hypercholesteremia   . Hypertension   . Hypothyroidism   . Pneumonia    hx of  . PONV (postoperative nausea and vomiting)   . Thyroid disease     Family History  Problem Relation Age of Onset  . Adopted: Yes  . Coronary artery disease    . Diabetes Mother   . Heart disease Mother   . Hyperlipidemia Mother     Past Surgical History:  Procedure Laterality Date  . ABDOMINAL HYSTERECTOMY     partial  . BREAST SURGERY     lumpectomy left breast  . carpel tunnel    . COLONOSCOPY W/  POLYPECTOMY    . FOOT SURGERY    . knee arhtroscopy    . SHOULDER ARTHROSCOPY WITH SUBACROMIAL DECOMPRESSION, ROTATOR CUFF REPAIR AND BICEP TENDON REPAIR Right 05/26/2012   Procedure: Right Shoulder Diagnostic Operative Arthroscopy, Subacromial Decompression, Biceps Tenodesis, Mini Open Rotator Cuff Repair;  Surgeon: [name], MD;  Location: [facility];  Service: Orthopedics;  Laterality: Right;  Right Shoulder Diagnostic Operative Arthroscopy, Subacromial Decompression, Biceps Tenodesis, Mini Open Rotator Cuff Repair  . TONSILLECTOMY     Social History   Occupational History  . Not on file.   Social History Main Topics  . Smoking status: Former Smoker    Quit date: 04/01/1990  . Smokeless tobacco: Never Used  . Alcohol use No  . Drug use: No  . Sexual activity: No

## 2016-07-05 ENCOUNTER — Other Ambulatory Visit (INDEPENDENT_AMBULATORY_CARE_PROVIDER_SITE_OTHER): Payer: Medicare Other

## 2016-07-05 DIAGNOSIS — E1165 Type 2 diabetes mellitus with hyperglycemia: Secondary | ICD-10-CM | POA: Diagnosis not present

## 2016-07-05 DIAGNOSIS — Z794 Long term (current) use of insulin: Secondary | ICD-10-CM | POA: Diagnosis not present

## 2016-07-05 LAB — LIPID PANEL
CHOL/HDL RATIO: 3
Cholesterol: 170 mg/dL (ref 0–200)
HDL: 56.6 mg/dL (ref >39.00)
LDL CALC: 94 mg/dL (ref 0–99)
NonHDL: 113.48
TRIGLYCERIDES: 99 mg/dL (ref 0.0–149.0)
VLDL: 19.8 mg/dL (ref 0.0–40.0)

## 2016-07-05 LAB — COMPREHENSIVE METABOLIC PANEL
ALT: 15 U/L (ref 0–35)
AST: 15 U/L (ref 0–37)
Albumin: 4 g/dL (ref 3.5–5.2)
Alkaline Phosphatase: 56 U/L (ref 39–117)
BUN: 14 mg/dL (ref 6–23)
CHLORIDE: 103 meq/L (ref 96–112)
CO2: 32 meq/L (ref 19–32)
Calcium: 10 mg/dL (ref 8.4–10.5)
Creatinine, Ser: 0.76 mg/dL (ref 0.40–1.20)
GFR: 97.08 mL/min (ref >60.00)
GLUCOSE: 107 mg/dL — AB (ref 70–99)
POTASSIUM: 4 meq/L (ref 3.5–5.1)
SODIUM: 142 meq/L (ref 135–145)
Total Bilirubin: 0.2 mg/dL (ref 0.2–1.2)
Total Protein: 7.1 g/dL (ref 6.0–8.3)

## 2016-07-05 LAB — TSH: TSH: 0.25 u[IU]/mL — ABNORMAL LOW (ref 0.35–4.50)

## 2016-07-05 LAB — HEMOGLOBIN A1C: HEMOGLOBIN A1C: 7.8 % — AB (ref 4.6–6.5)

## 2016-07-09 ENCOUNTER — Ambulatory Visit (INDEPENDENT_AMBULATORY_CARE_PROVIDER_SITE_OTHER): Payer: Medicare Other | Admitting: Endocrinology

## 2016-07-09 ENCOUNTER — Encounter: Payer: Self-pay | Admitting: Endocrinology

## 2016-07-09 VITALS — BP 140/82 | HR 78 | Ht 65.0 in | Wt 241.0 lb

## 2016-07-09 DIAGNOSIS — E038 Other specified hypothyroidism: Secondary | ICD-10-CM

## 2016-07-09 DIAGNOSIS — Z794 Long term (current) use of insulin: Secondary | ICD-10-CM | POA: Diagnosis not present

## 2016-07-09 DIAGNOSIS — E1165 Type 2 diabetes mellitus with hyperglycemia: Secondary | ICD-10-CM | POA: Diagnosis not present

## 2016-07-09 DIAGNOSIS — I10 Essential (primary) hypertension: Secondary | ICD-10-CM

## 2016-07-09 DIAGNOSIS — E063 Autoimmune thyroiditis: Secondary | ICD-10-CM

## 2016-07-09 NOTE — Patient Instructions (Signed)
Check blood sugars on waking up    Also check blood sugars about 2-3 hours after a meal and do this after different meals by rotation  Recommended blood sugar levels on waking up is 90-130 and about 2 hours after meal is 130-160  Please bring your blood sugar monitor to each visit, thank you  Walk daily

## 2016-07-09 NOTE — Progress Notes (Signed)
Patient ID: Marissa Roberts, female   DOB: 29-Sep-1945, 71 y.o.   MRN: [id number]           Reason for Appointment:  Follow-up for diabetes and hypertension   History of Present Illness:    HYPERTENSION   Her blood pressure has been treated by various medications in the past  Previously followed by PCP. She does not monitor at home  Currently on amlodipine and also valsartan HCTZ Blood pressure appears to be relatively well-controlled, high normal systolic today  She also has had problems chronically with edema, taking Lasix twice a day She still has a little swelling on the left side only  Recent readings:  BP Readings from Last 3 Encounters:  07/09/16 140/82  04/10/16 130/70  02/08/16 (!) 154/82            Type 2 diabetes mellitus, date of diagnosis: 2005        Recent history:   INSULIN regimen is  Tresiba 52 units hs, Novolog 15-20 units bid- tid   Oral hypoglycemic drugs the patient is taking are: Metformin 2 g daily  Her blood sugars appear to be Still adequately controlled with A1c 7.8, previously 7.9  Current blood sugar patterns, management and problems identified:  Her blood sugars are being checked in the mornings only again despite reminders to check postprandial readings; She says she does not check postprandial readings because she is snacking in between meals or after supper all the time  Overall fasting blood sugars are fairly good although has overall high readings also  She is trying to do better with taking her insulin when she is going out to eat but not always  She still not doing too well on her diet with eating out regularly and periodically having sweet tea  She does take mostly 15 units for her meals and will take 20 units when eating out are planning a larger meal  She is  compliant with her Tyler Aas every night  Not trying to exercise again despite reminders  Again has difficulty losing weight No hypoglycemia       Side effects  from medications have been:none  Compliance with the medical regimen: Fair  Glucose monitoring:  done 1-2 time a day         Glucometer:  One Touch.      Blood Glucose readings from download, has all morning readings  MEDIAN 131 range 78-214  Suppertime 100, 116   Self-care: The diet that the patient has been following is: None    Meals: 3 meals per day. Breakfast is yogurt or toast, bacon, egg; usually not a lot of carbohydrates, rarely fried food including fish.    Not always restricting fat intake or sweet tea sometimes when eating out        Exercise:  not walking, recently has had some sprain on her right ankle   Dietician visit, most recent:2014.               Weight history:  Wt Readings from Last 3 Encounters:  07/09/16 241 lb (109.3 kg)  06/06/16 242 lb (109.8 kg)  04/10/16 242 lb (109.8 kg)    Glycemic control:   Lab Results  Component Value Date   HGBA1C 7.8 (H) 07/05/2016   HGBA1C 7.9 (H) 04/05/2016   HGBA1C 7.3 (H) 12/26/2015   Lab Results  Component Value Date   MICROALBUR 61.8 (H) 04/05/2016   LDLCALC 94 07/05/2016   CREATININE 0.76 07/05/2016   No  results found for: FRUCTOSAMINE    Past history:  She thinks her blood sugars were only mildly increased at borderline levels at the onsetper Most likely she was treated with metformin initially and this has been continued.   At some point she was also tried on [place] and [place] for improving her control. She is not sure why she was started on insulin 5 years ago, presumably for worsening hyperglycemia Also not clear if she has tried various insulin regimens before starting Lantus and NovoLog Her A1c has been usually over 7% and gradually increasing over the last year Previously had tried the V-go pump but subsequently she stopped doing the V-go pump because of the cost, skin irritation and local discomfort   Allergies as of 07/09/2016      Reactions   Contrast Media [iodinated Diagnostic Agents]      Throat swells from ct con tra   Adhesive [tape]    No paper tape, causes blisters   Erythromycin    REACTION: rash   Penicillins    REACTION: rash      Medication List       Accurate as of 07/09/16  1:01 PM. Always use your most recent med list.          amLODipine 10 MG tablet Commonly known as:  NORVASC Take 1 tablet (10 mg total) by mouth daily.   CALCIUM 600 1500 (600 Ca) MG Tabs tablet Generic drug:  calcium carbonate Take 2 tablets by mouth daily.   cyanocobalamin 100 MCG tablet Take 100 mcg by mouth daily.   EQ FIBER SUPPLEMENT PO Take by mouth.   fluticasone 50 MCG/ACT nasal spray Commonly known as:  FLONASE Place 2 sprays into both nostrils daily.   furosemide 40 MG tablet Commonly known as:  LASIX TAKE 2 TABLETS (80 MG TOTAL) BY MOUTH EVERY DAY   glucosamine-chondroitin 500-400 MG tablet Take 1 tablet by mouth daily.   glucose blood test strip Commonly known as:  ONETOUCH VERIO Please check blood sugars at least half the time about 2 hours after any meal and 3 times per day on waking up. Dx code E11.9   insulin aspart 100 UNIT/ML FlexPen Commonly known as:  NOVOLOG FLEXPEN Inject  20 units before each meal   NOVOLOG FLEXPEN 100 UNIT/ML FlexPen Generic drug:  insulin aspart INJECT UP TO 20 UNITS INTO THE SKIN BEFORE EACH MEAL   Insulin Degludec 200 UNIT/ML Sopn Commonly known as:  TRESIBA FLEXTOUCH Inject 52 Units into the skin every morning.   Insulin Pen Needle 31G X 8 MM Misc Commonly known as:  EASY TOUCH PEN NEEDLES USE TO INJECT LANTUS AND NOVOLOG 4 TIMES PER DAY-DX CODE E11.9   levothyroxine 137 MCG tablet Commonly known as:  SYNTHROID, LEVOTHROID TAKE ONE TABLET BY MOUTH ONCE DAILY   levothyroxine 137 MCG tablet Commonly known as:  SYNTHROID, LEVOTHROID TAKE ONE TABLET BY MOUTH ONCE DAILY   LINZESS 290 MCG Caps capsule Generic drug:  linaclotide Take 1 capsule (290 mcg total) by mouth daily.   magnesium oxide 400 MG  tablet Commonly known as:  MAG-OX Take 400 mg by mouth daily.   metFORMIN 1000 MG tablet Commonly known as:  GLUCOPHAGE Take 1 tablet (1,000 mg total) by mouth 2 (two) times daily with a meal.   metFORMIN 1000 MG tablet Commonly known as:  GLUCOPHAGE TAKE ONE TABLET BY MOUTH TWICE DAILY WITH MEALS   ONETOUCH DELICA LANCETS FINE Misc Please check blood sugars at least half the  time about 2 hours after any meal and 3 times per week on waking up. Dx code E11.9   potassium chloride 10 MEQ tablet Commonly known as:  K-DUR Take 1 tablet (10 mEq total) by mouth 2 (two) times daily.   PROBIOTIC DAILY PO Take by mouth.   rosuvastatin 20 MG tablet Commonly known as:  CRESTOR TAKE 1 TABLET (20 MG TOTAL) BY MOUTH DAILY.   traMADol 50 MG tablet Commonly known as:  ULTRAM Take 1 tablet (50 mg total) by mouth every 12 (twelve) hours as needed.   valsartan-hydrochlorothiazide 160-12.5 MG tablet Commonly known as:  DIOVAN HCT Take 1 tablet by mouth daily.       Allergies:  Allergies  Allergen Reactions  . Contrast Media [Iodinated Diagnostic Agents]     Throat swells from ct con tra  . Adhesive [Tape]     No paper tape, causes blisters  . Erythromycin     REACTION: rash  . Penicillins     REACTION: rash    Past Medical History:  Diagnosis Date  . Arthritis   . Bronchitis    hx of  . CHF (congestive heart failure) ([place])   . Diabetes mellitus   . GERD (gastroesophageal reflux disease)   . Hypercholesteremia   . Hypertension   . Hypothyroidism   . Pneumonia    hx of  . PONV (postoperative nausea and vomiting)   . Thyroid disease     Past Surgical History:  Procedure Laterality Date  . ABDOMINAL HYSTERECTOMY     partial  . BREAST SURGERY     lumpectomy left breast  . carpel tunnel    . COLONOSCOPY W/ POLYPECTOMY    . FOOT SURGERY    . knee arhtroscopy    . SHOULDER ARTHROSCOPY WITH SUBACROMIAL DECOMPRESSION, ROTATOR CUFF REPAIR AND BICEP TENDON REPAIR Right  05/26/2012   Procedure: Right Shoulder Diagnostic Operative Arthroscopy, Subacromial Decompression, Biceps Tenodesis, Mini Open Rotator Cuff Repair;  Surgeon: [name], MD;  Location: [facility];  Service: Orthopedics;  Laterality: Right;  Right Shoulder Diagnostic Operative Arthroscopy, Subacromial Decompression, Biceps Tenodesis, Mini Open Rotator Cuff Repair  . TONSILLECTOMY      Family History  Problem Relation Age of Onset  . Adopted: Yes  . Coronary artery disease    . Diabetes Mother   . Heart disease Mother   . Hyperlipidemia Mother     Social History:  reports that she quit smoking about 26 years ago. She has never used smokeless tobacco. She reports that she does not drink alcohol or use drugs.    Review of Systems      LIPIDS: followed by PCP. She is taking Crestor 20 mg, previously LDL was high with simvastatin 20 mg LDL below 100 Consistently       Lab Results  Component Value Date   CHOL 170 07/05/2016   HDL 56.60 07/05/2016   LDLCALC 94 07/05/2016   LDLDIRECT 129.5 02/06/2012   TRIG 99.0 07/05/2016   CHOLHDL 3 07/05/2016                  Thyroid:     She has been hypothyroid for about 40 years and is taking a supplement of 137 g levothyroxine with The following labs   Lab Results  Component Value Date   TSH 0.25 (L) 07/05/2016   TSH 1.27 09/26/2015   TSH 0.42 11/11/2014   FREET4 1.03 09/26/2015   FREET4 1.03 06/28/2010   FREET4 1.36 11/27/2007  Physical Examination:  BP 140/82 (Cuff Size: Large)   Pulse 78   Ht 5\' 5"  (1.651 m)   Wt 241 lb (109.3 kg)   BMI 40.10 kg/m     she has 1+ lower leg and pedal edema on the right only  ASSESSMENT/PLAN:  DIABETES:  See history of present illness for detailed discussion of current diabetes management, blood sugar patterns and problems identified  A1c is 7.8; discussed that this indicates that she has higher postprandial readings since fasting readings are averaging only about 140 Since  she has only fasting blood sugars not clear if her sugars are higher after meals She has inconsistent diet and frequent snacking Does not check readings after meals Also has not done any exercise lately  Recommendations: Start checking readings after meals to help her adjust her NovoLog as well as to make her aware of what is making her sugar go up.  She can still take her blood sugar even if she has had a small snack Discussed blood sugar targets If she has lower readings in the mornings may need to reduce her Tyler Aas Regular exercise No change in basic regimen as yet   HYPERTENSION: Fairly well controlled with triple drug regimen of amlodipine/valsartan/HCT Also has had some microalbumin urea  HYPOTHYROIDISM: Will need to reduce her Synthroid by half tablet weekly because of low-normal TSH  Counseling time on subjects discussed above is over 50% of today's 25 minute visit   Patient Instructions  Check blood sugars on waking up    Also check blood sugars about 2-3 hours after a meal and do this after different meals by rotation  Recommended blood sugar levels on waking up is 90-130 and about 2 hours after meal is 130-160  Please bring your blood sugar monitor to each visit, thank you  Walk daily    Glennon Kopko 07/09/2016, 1:01 PM   Note: This office note was prepared with Dragon voice recognition system technology. Any transcriptional errors that result from this process are unintentional.

## 2016-07-12 DIAGNOSIS — A048 Other specified bacterial intestinal infections: Secondary | ICD-10-CM | POA: Diagnosis not present

## 2016-07-12 DIAGNOSIS — K2270 Barrett's esophagus without dysplasia: Secondary | ICD-10-CM | POA: Diagnosis not present

## 2016-07-12 DIAGNOSIS — E119 Type 2 diabetes mellitus without complications: Secondary | ICD-10-CM | POA: Diagnosis not present

## 2016-07-12 DIAGNOSIS — K635 Polyp of colon: Secondary | ICD-10-CM | POA: Diagnosis not present

## 2016-07-12 DIAGNOSIS — K648 Other hemorrhoids: Secondary | ICD-10-CM | POA: Diagnosis not present

## 2016-07-12 LAB — HM COLONOSCOPY

## 2016-07-17 ENCOUNTER — Other Ambulatory Visit: Payer: Self-pay | Admitting: Endocrinology

## 2016-07-17 DIAGNOSIS — K5281 Eosinophilic gastritis or gastroenteritis: Secondary | ICD-10-CM | POA: Diagnosis not present

## 2016-07-17 DIAGNOSIS — K317 Polyp of stomach and duodenum: Secondary | ICD-10-CM | POA: Diagnosis not present

## 2016-07-17 DIAGNOSIS — A048 Other specified bacterial intestinal infections: Secondary | ICD-10-CM | POA: Diagnosis not present

## 2016-07-17 DIAGNOSIS — K2970 Gastritis, unspecified, without bleeding: Secondary | ICD-10-CM | POA: Diagnosis not present

## 2016-07-18 DIAGNOSIS — K2270 Barrett's esophagus without dysplasia: Secondary | ICD-10-CM | POA: Diagnosis not present

## 2016-07-18 DIAGNOSIS — K209 Esophagitis, unspecified: Secondary | ICD-10-CM | POA: Diagnosis not present

## 2016-07-18 DIAGNOSIS — K208 Other esophagitis: Secondary | ICD-10-CM | POA: Diagnosis not present

## 2016-07-18 DIAGNOSIS — K635 Polyp of colon: Secondary | ICD-10-CM | POA: Diagnosis not present

## 2016-07-25 ENCOUNTER — Other Ambulatory Visit: Payer: Self-pay

## 2016-07-26 ENCOUNTER — Other Ambulatory Visit: Payer: Self-pay

## 2016-07-26 MED ORDER — POTASSIUM CHLORIDE ER 10 MEQ PO TBCR: 10.0000 meq | EXTENDED_RELEASE_TABLET | Freq: Every day | ORAL | 3 refills | Status: DC

## 2016-08-01 ENCOUNTER — Ambulatory Visit (INDEPENDENT_AMBULATORY_CARE_PROVIDER_SITE_OTHER): Payer: Medicare Other | Admitting: Orthopedic Surgery

## 2016-08-07 DIAGNOSIS — K2270 Barrett's esophagus without dysplasia: Secondary | ICD-10-CM | POA: Diagnosis not present

## 2016-08-07 DIAGNOSIS — K219 Gastro-esophageal reflux disease without esophagitis: Secondary | ICD-10-CM | POA: Diagnosis not present

## 2016-08-07 DIAGNOSIS — K625 Hemorrhage of anus and rectum: Secondary | ICD-10-CM | POA: Diagnosis not present

## 2016-08-12 ENCOUNTER — Encounter: Payer: Self-pay | Admitting: Internal Medicine

## 2016-08-12 ENCOUNTER — Ambulatory Visit (INDEPENDENT_AMBULATORY_CARE_PROVIDER_SITE_OTHER): Payer: Medicare Other | Admitting: Internal Medicine

## 2016-08-12 DIAGNOSIS — H9203 Otalgia, bilateral: Secondary | ICD-10-CM | POA: Diagnosis not present

## 2016-08-12 DIAGNOSIS — H9209 Otalgia, unspecified ear: Secondary | ICD-10-CM | POA: Insufficient documentation

## 2016-08-12 MED ORDER — MONTELUKAST SODIUM 10 MG PO TABS
10.0000 mg | ORAL_TABLET | Freq: Every day | ORAL | 3 refills | Status: DC
Start: 2016-08-12 — End: 2017-05-21

## 2016-08-12 NOTE — Assessment & Plan Note (Signed)
Suspect TMJ versus sinus pressure based on exam. She had recent EGD prior to onset which can exacerbate TMJ. 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 Rx for singulair to add for sinus congestion to see if this helps.

## 2016-08-12 NOTE — Progress Notes (Signed)
   Subjective:    Patient ID: Marissa Roberts, female    DOB: [birth year], 71 y.o.   MRN: [id number]  HPI The patient is a 71 YO female coming in for sore throat and ear pain. She is taking tylenol which is helpful. She is also having some nose congestion and drainage. Symptoms started about 2 weeks ago after she had EGD and colonoscopy. They told her it was not related. She is taking zyrtec and flonase for the symptoms with minimal change.No fevers or chills. She is having the ear pain and neck pain. Some nasal congestion but not much nasal drainage. 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 Denies cough or SOB.   Review of Systems  Constitutional: Negative for activity change, appetite change, fatigue, fever and unexpected weight change.  HENT: Positive for congestion, ear pain, rhinorrhea and sinus pressure. Negative for drooling, ear discharge, facial swelling, mouth sores, nosebleeds, postnasal drip, sinus pain, sore throat, tinnitus, trouble swallowing and voice change.   Eyes: Negative.   Respiratory: Negative.   Cardiovascular: Negative.   Gastrointestinal: Negative.   Musculoskeletal: Negative.   Neurological: Negative.       Objective:   Physical Exam  Constitutional: She is oriented to person, place, and time. She appears well-developed and well-nourished.  HENT:  Head: Normocephalic and atraumatic.  Right Ear: External ear normal.  Left Ear: External ear normal.  Oropharynx with redness and clear drainage and nasal turbinates swollen. Pain over the TMJ  Eyes: EOM are normal.  Neck: Normal range of motion. Neck supple. No JVD present.  Cardiovascular: Normal rate and regular rhythm.   Pulmonary/Chest: Effort normal and breath sounds normal. No respiratory distress. She has no wheezes. She has no rales.  Abdominal: Soft. Bowel sounds are normal.  Lymphadenopathy:    She has no cervical adenopathy.  Neurological: She is alert and  oriented to person, place, and time.  Skin: Skin is warm and dry.   Vitals:   08/12/16 1047  BP: 138/78  Pulse: 89  Resp: 14  Temp: 98.6 F (37 C)  TempSrc: Oral  SpO2: 99%  Weight: 242 lb (109.8 kg)  Height: 5\' 5"  (1.651 m)      Assessment & Plan:

## 2016-08-12 NOTE — Patient Instructions (Addendum)
We have sent in singulair for the sinuses which is 1 pill daily.   Try using some ice on the cheeks in case this is from TMJ.   You can try elevating the legs to help with swelling.

## 2016-08-24 ENCOUNTER — Other Ambulatory Visit: Payer: Self-pay | Admitting: Endocrinology

## 2016-09-03 DIAGNOSIS — K625 Hemorrhage of anus and rectum: Secondary | ICD-10-CM | POA: Diagnosis not present

## 2016-09-03 DIAGNOSIS — K648 Other hemorrhoids: Secondary | ICD-10-CM | POA: Diagnosis not present

## 2016-09-04 ENCOUNTER — Other Ambulatory Visit: Payer: Self-pay

## 2016-09-04 ENCOUNTER — Telehealth: Payer: Self-pay | Admitting: Internal Medicine

## 2016-09-04 MED ORDER — POTASSIUM CHLORIDE ER 10 MEQ PO TBCR: 10.0000 meq | EXTENDED_RELEASE_TABLET | Freq: Two times a day (BID) | ORAL | 3 refills | Status: DC

## 2016-09-04 NOTE — Telephone Encounter (Signed)
Called patient to let her know that I sent the correct dosage and amount to the [facility] for her.

## 2016-09-04 NOTE — Telephone Encounter (Signed)
**  Remind patient they can make refill requests via MyChart**  Medication refill request (Name & Dosage):  Potassium chloride  Take 1 tablet (10 mEq total) by mouth 2 (two) times daily.  Preferred pharmacy (Name & Address):   [facility] [place]  Other comments (if applicable):   Patient said her last script was wrong. It was listed for one by mouth daily but it should be two and a 90 day supply.

## 2016-09-12 ENCOUNTER — Encounter: Payer: Self-pay | Admitting: Internal Medicine

## 2016-09-12 ENCOUNTER — Other Ambulatory Visit: Payer: Self-pay | Admitting: Internal Medicine

## 2016-09-12 ENCOUNTER — Ambulatory Visit: Payer: Medicare Other | Admitting: Internal Medicine

## 2016-09-12 ENCOUNTER — Ambulatory Visit (INDEPENDENT_AMBULATORY_CARE_PROVIDER_SITE_OTHER): Payer: Medicare Other | Admitting: Internal Medicine

## 2016-09-12 VITALS — BP 130/84 | HR 81 | Ht 65.0 in | Wt 243.0 lb

## 2016-09-12 DIAGNOSIS — I10 Essential (primary) hypertension: Secondary | ICD-10-CM | POA: Diagnosis not present

## 2016-09-12 DIAGNOSIS — Z0000 Encounter for general adult medical examination without abnormal findings: Secondary | ICD-10-CM

## 2016-09-12 DIAGNOSIS — E1165 Type 2 diabetes mellitus with hyperglycemia: Secondary | ICD-10-CM

## 2016-09-12 DIAGNOSIS — E039 Hypothyroidism, unspecified: Secondary | ICD-10-CM | POA: Diagnosis not present

## 2016-09-12 DIAGNOSIS — Z23 Encounter for immunization: Secondary | ICD-10-CM

## 2016-09-12 DIAGNOSIS — Z794 Long term (current) use of insulin: Secondary | ICD-10-CM | POA: Diagnosis not present

## 2016-09-12 DIAGNOSIS — E118 Type 2 diabetes mellitus with unspecified complications: Secondary | ICD-10-CM

## 2016-09-12 DIAGNOSIS — E785 Hyperlipidemia, unspecified: Secondary | ICD-10-CM | POA: Diagnosis not present

## 2016-09-12 DIAGNOSIS — IMO0002 Reserved for concepts with insufficient information to code with codable children: Secondary | ICD-10-CM

## 2016-09-12 MED ORDER — ROSUVASTATIN CALCIUM 40 MG PO TABS: 40.0000 mg | ORAL_TABLET | Freq: Every day | ORAL | 3 refills | Status: DC

## 2016-09-12 NOTE — Patient Instructions (Addendum)
OK to increase the crestor to 40 mg per day  Please take all of your diabetic medication every day  Please continue all other medications as before, although one might consider decreased or stopping the amlodipine with your swelling, and then maybe increase the diovan HCT.  Please have the pharmacy call with any other refills you may need.  Please continue your efforts at being more active, low cholesterol diet, and weight control.  Please keep your appointments with your specialists as you may have planned - Dr Dwyane Dee next month  Please return in 6 months, or sooner if needed  Continue doing brain stimulating activities (puzzles, reading, adult coloring books, staying active) to keep memory sharp.   Continue to eat heart healthy diet (full of fruits, vegetables, whole grains, lean protein, water--limit salt, fat, and sugar intake) and increase physical activity as tolerated.   Marissa Roberts , Thank you for taking time to come for your Medicare Wellness Visit. I appreciate your ongoing commitment to your health goals. Please review the following plan we discussed and let me know if I can assist you in the future.   These are the goals we discussed: Goals    . I do want to get out and walk          Start to walk in my neighborhood 3 times a week       This is a list of the screening recommended for you and due dates:  Health Maintenance  Topic Date Due  .  Hepatitis C: One time screening is recommended by Center for Disease Control  (CDC) for  adults born from [birth year] through 1963.   [birth year]  . Pneumonia vaccines (2 of 2 - PCV13) 07/15/2015  . Eye exam for diabetics  12/12/2015  . Mammogram  04/01/2016  . Flu Shot  10/30/2016  . Hemoglobin A1C  01/04/2017  . Complete foot exam   04/10/2017  . Colon Cancer Screening  06/20/2020  . Tetanus Vaccine  10/08/2023  . DEXA scan (bone density measurement)  Addressed   Pneumococcal Conjugate Vaccine (PCV13) What You Need to Know 1. Why  get vaccinated? Vaccination can protect both children and adults from pneumococcal disease. Pneumococcal disease is caused by bacteria that can spread from person to person through close contact. It can cause ear infections, and it can also lead to more serious infections of the:  Lungs (pneumonia),  Blood (bacteremia), and  Covering of the brain and spinal cord (meningitis).  Pneumococcal pneumonia is most common among adults. Pneumococcal meningitis can cause deafness and brain damage, and it kills about 1 child in 10 who get it. Anyone can get pneumococcal disease, but children under 37 years of age and adults 6 years and older, people with certain medical conditions, and cigarette smokers are at the highest risk. Before there was a vaccine, the [place] saw:  more than 700 cases of meningitis,  about 13,000 blood infections,  about 5 million ear infections, and  about 200 deaths  in children under 5 each year from pneumococcal disease. Since vaccine became available, severe pneumococcal disease in these children has fallen by 88%. About 18,000 older adults die of pneumococcal disease each year in the [place]. Treatment of pneumococcal infections with penicillin and other drugs is not as effective as it used to be, because some strains of the disease have become resistant to these drugs. This makes prevention of the disease, through vaccination, even more important. 2. PCV13 vaccine Pneumococcal conjugate  vaccine (called PCV13) protects against 13 types of pneumococcal bacteria. PCV13 is routinely given to children at 2, 4, 6, and 38-67 months of age. It is also recommended for children and adults 17 to 48 years of age with certain health conditions, and for all adults 46 years of age and older. Your doctor can give you details. 3. Some people should not get this vaccine Anyone who has ever had a life-threatening allergic reaction to a dose of this vaccine, to an earlier  pneumococcal vaccine called PCV7, or to any vaccine containing diphtheria toxoid (for example, DTaP), should not get PCV13. Anyone with a severe allergy to any component of PCV13 should not get the vaccine. Tell your doctor if the person being vaccinated has any severe allergies. If the person scheduled for vaccination is not feeling well, your healthcare provider might decide to reschedule the shot on another day. 4. Risks of a vaccine reaction With any medicine, including vaccines, there is a chance of reactions. These are usually mild and go away on their own, but serious reactions are also possible. Problems reported following PCV13 varied by age and dose in the series. The most common problems reported among children were:  About half became drowsy after the shot, had a temporary loss of appetite, or had redness or tenderness where the shot was given.  About 1 out of 3 had swelling where the shot was given.  About 1 out of 3 had a mild fever, and about 1 in 20 had a fever over 102.69F.  Up to about 8 out of 10 became fussy or irritable.  Adults have reported pain, redness, and swelling where the shot was given; also mild fever, fatigue, headache, chills, or muscle pain. Young children who get PCV13 along with inactivated flu vaccine at the same time may be at increased risk for seizures caused by fever. Ask your doctor for more information. Problems that could happen after any vaccine:  People sometimes faint after a medical procedure, including vaccination. Sitting or lying down for about 15 minutes can help prevent fainting, and injuries caused by a fall. Tell your doctor if you feel dizzy, or have vision changes or ringing in the ears.  Some older children and adults get severe pain in the shoulder and have difficulty moving the arm where a shot was given. This happens very rarely.  Any medication can cause a severe allergic reaction. Such reactions from a vaccine are very rare,  estimated at about 1 in a million doses, and would happen within a few minutes to a few hours after the vaccination. As with any medicine, there is a very small chance of a vaccine causing a serious injury or death. The safety of vaccines is always being monitored. For more information, visit: http://www.aguilar.org/ 5. What if there is a serious reaction? What should I look for? Look for anything that concerns you, such as signs of a severe allergic reaction, very high fever, or unusual behavior. Signs of a severe allergic reaction can include hives, swelling of the face and throat, difficulty breathing, a fast heartbeat, dizziness, and weakness-usually within a few minutes to a few hours after the vaccination. What should I do?  If you think it is a severe allergic reaction or other emergency that can't wait, call 9-1-1 or get the person to the nearest hospital. Otherwise, call your doctor.  Reactions should be reported to the Vaccine Adverse Event Reporting System (VAERS). Your doctor should file this report, or you can  do it yourself through the VAERS web site at www.vaers.SamedayNews.es, or by calling (931)034-5315. ? VAERS does not give medical advice. 6. The National Vaccine Injury Compensation Program The [employer] Vaccine Injury Compensation Program (VICP) is a federal program that was created to compensate people who may have been injured by certain vaccines. Persons who believe they may have been injured by a vaccine can learn about the program and about filing a claim by calling 848-885-8301 or visiting the [place] website at GoldCloset.com.ee. There is a time limit to file a claim for compensation. 7. How can I learn more?  Ask your healthcare provider. He or she can give you the vaccine package insert or suggest other sources of information.  Call your local or state health department.  Contact the Centers for Disease Control and Prevention (CDC): ? Call (574)154-1919  (1-800-CDC-INFO) or ? Visit CDC's website at http://hunter.com/ Vaccine Information Statement, PCV13 Vaccine (02/03/2014) This information is not intended to replace advice given to you by your health care provider. Make sure you discuss any questions you have with your health care provider. Document Released: 01/13/2006 Document Revised: 12/07/2015 Document Reviewed: 12/07/2015 Elsevier Interactive Patient Education  2017 [employer].

## 2016-09-12 NOTE — Progress Notes (Signed)
Subjective:   Marissa Roberts is a 71 y.o. female who presents for Medicare Annual (Subsequent) preventive examination.  Review of Systems:  No ROS.  Medicare Wellness Visit. Additional risk factors are reflected in the social history.  Cardiac Risk Factors include: advanced age (>21men, >13 women);diabetes mellitus;dyslipidemia;hypertension;sedentary lifestyle Sleep patterns: feels rested on waking, gets up 1 times nightly to void and sleeps 7-8 hours nightly.    Home Safety/Smoke Alarms: Feels safe in home. Smoke alarms in place.  Living environment; residence and Firearm Safety: 1-story house/ trailer, no firearms.Lives with husband, no needs for DME Seat Belt Safety/Bike Helmet: Wears seat belt.   Counseling:   Eye Exam- appointment every 6 months Dental- appointment yearly  Female:   Pap- N/A      Mammo- Last 05/02/16     Dexa scan- Last 05/02/14      CCS- Last 06/21/10, recall 10 years     Objective:     Vitals: BP 130/84   Pulse 81   Ht 5\' 5"  (1.651 m)   Wt 243 lb (110.2 kg)   SpO2 100%   BMI 40.44 kg/m   Body mass index is 40.44 kg/m.   Tobacco History  Smoking Status  . Former Smoker  . Quit date: 04/01/1990  Smokeless Tobacco  . Never Used     Counseling given: Not Answered   Past Medical History:  Diagnosis Date  . Arthritis   . Bronchitis    hx of  . CHF (congestive heart failure) ([place])   . Diabetes mellitus   . GERD (gastroesophageal reflux disease)   . Hypercholesteremia   . Hypertension   . Hypothyroidism   . Pneumonia    hx of  . PONV (postoperative nausea and vomiting)   . Thyroid disease    Past Surgical History:  Procedure Laterality Date  . ABDOMINAL HYSTERECTOMY     partial  . BREAST SURGERY     lumpectomy left breast  . carpel tunnel    . COLONOSCOPY W/ POLYPECTOMY    . FOOT SURGERY    . knee arhtroscopy    . SHOULDER ARTHROSCOPY WITH SUBACROMIAL DECOMPRESSION, ROTATOR CUFF REPAIR AND BICEP TENDON REPAIR Right 05/26/2012     Procedure: Right Shoulder Diagnostic Operative Arthroscopy, Subacromial Decompression, Biceps Tenodesis, Mini Open Rotator Cuff Repair;  Surgeon: [name], MD;  Location: [facility];  Service: Orthopedics;  Laterality: Right;  Right Shoulder Diagnostic Operative Arthroscopy, Subacromial Decompression, Biceps Tenodesis, Mini Open Rotator Cuff Repair  . TONSILLECTOMY     Family History  Problem Relation Age of Onset  . Adopted: Yes  . Diabetes Mother   . Heart disease Mother   . Hyperlipidemia Mother   . Coronary artery disease Unknown    History  Sexual Activity  . Sexual activity: No    Outpatient Encounter Prescriptions as of 09/12/2016  Medication Sig  . amLODipine (NORVASC) 10 MG tablet Take 1 tablet (10 mg total) by mouth daily.  . Calcium Carbonate (CALCIUM 600) 1500 MG TABS Take 2 tablets by mouth daily.   . cyanocobalamin 100 MCG tablet Take 100 mcg by mouth daily.  Noelle Penner FIBER SUPPLEMENT PO Take by mouth.  . fluticasone (FLONASE) 50 MCG/ACT nasal spray Place 2 sprays into both nostrils daily.  . furosemide (LASIX) 40 MG tablet TAKE 2 TABLETS BY MOUTH EVERY DAY  . glucosamine-chondroitin 500-400 MG tablet Take 1 tablet by mouth daily.    Marland Kitchen glucose blood (ONETOUCH VERIO) test strip Please check blood sugars  at least half the time about 2 hours after any meal and 3 times per day on waking up. Dx code E11.9  . insulin aspart (NOVOLOG FLEXPEN) 100 UNIT/ML FlexPen Inject  20 units before each meal  . Insulin Degludec (TRESIBA FLEXTOUCH) 200 UNIT/ML SOPN Inject 52 Units into the skin every morning. (Patient taking differently: Inject 52 Units into the skin every evening. )  . Insulin Pen Needle (EASY TOUCH PEN NEEDLES) 31G X 8 MM MISC USE TO INJECT LANTUS AND NOVOLOG 4 TIMES PER DAY-DX CODE E11.9  . levothyroxine (SYNTHROID, LEVOTHROID) 137 MCG tablet TAKE ONE TABLET BY MOUTH ONCE DAILY  . LINZESS 290 MCG CAPS capsule Take 1 capsule (290 mcg total) by mouth daily.  . magnesium  oxide (MAG-OX) 400 MG tablet Take 400 mg by mouth daily.  . metFORMIN (GLUCOPHAGE) 1000 MG tablet Take 1 tablet (1,000 mg total) by mouth 2 (two) times daily with a meal.  . metFORMIN (GLUCOPHAGE) 1000 MG tablet TAKE ONE TABLET BY MOUTH TWICE DAILY WITH MEALS  . montelukast (SINGULAIR) 10 MG tablet Take 1 tablet (10 mg total) by mouth at bedtime.  Marland Kitchen NOVOLOG FLEXPEN 100 UNIT/ML FlexPen INJECT UP TO 20 UNITS INTO THE SKIN BEFORE EACH MEAL  . ONETOUCH DELICA LANCETS FINE MISC Please check blood sugars at least half the time about 2 hours after any meal and 3 times per week on waking up. Dx code E11.9  . potassium chloride (K-DUR) 10 MEQ tablet Take 1 tablet (10 mEq total) by mouth 2 (two) times daily.  . potassium chloride (KLOR-CON 10) 10 MEQ tablet Take 1 tablet (10 mEq total) by mouth daily.  . Probiotic Product (PROBIOTIC DAILY PO) Take by mouth.  . traMADol (ULTRAM) 50 MG tablet Take 1 tablet (50 mg total) by mouth every 12 (twelve) hours as needed.  . TRESIBA FLEXTOUCH 200 UNIT/ML SOPN INJECT 52 UNITS INTO THE SKIN EVERY MORNING.  . valsartan-hydrochlorothiazide (DIOVAN HCT) 160-12.5 MG tablet Take 1 tablet by mouth daily.  . [DISCONTINUED] levothyroxine (SYNTHROID, LEVOTHROID) 137 MCG tablet TAKE ONE TABLET BY MOUTH ONCE DAILY  . [DISCONTINUED] rosuvastatin (CRESTOR) 20 MG tablet TAKE 1 TABLET (20 MG TOTAL) BY MOUTH DAILY.  . rosuvastatin (CRESTOR) 40 MG tablet Take 1 tablet (40 mg total) by mouth daily.   No facility-administered encounter medications on file as of 09/12/2016.     Activities of Daily Living In your present state of health, do you have any difficulty performing the following activities: 09/12/2016  Hearing? N  Vision? N  Difficulty concentrating or making decisions? N  Walking or climbing stairs? N  Dressing or bathing? N  Doing errands, shopping? N  Preparing Food and eating ? N  Using the Toilet? N  In the past six months, have you accidently leaked urine? N  Do you  have problems with loss of bowel control? N  Managing your Medications? N  Managing your Finances? N  Housekeeping or managing your Housekeeping? N  Some recent data might be hidden    Patient Care Team: [name], MD as PCP - General (Internal Medicine) Elayne Snare, MD as Consulting Physician (Endocrinology)    Assessment:    Physical assessment deferred to PCP.  Exercise Activities and Dietary recommendations Current Exercise Habits: The patient does not participate in regular exercise at present, Exercise limited by: None identified Diet (meal preparation, eat out, water intake, caffeinated beverages, dairy products, fruits and vegetables): in general, a "healthy" diet  , diabetic, low fat/ cholesterol,  low salt eats a variety of fruits and vegetables daily, limits salt, fat/cholesterol, sugar, caffeine, drinks 6-8 glasses of water daily.  Reviewed heart healthy and diabetic diets  Goals    . I do want to get out and walk          Start to walk in my neighborhood 3 times a week      Fall Risk Fall Risk  09/12/2016 09/12/2016 02/07/2015 04/19/2013  Falls in the past year? No No No No   Depression Screen PHQ 2/9 Scores 09/12/2016 09/12/2016 02/07/2015 01/16/2015  PHQ - 2 Score 0 0 0 0     Cognitive Function       Ad8 score reviewed for issues:  Issues making decisions: no  Less interest in hobbies / activities: no  Repeats questions, stories (family complaining): no  Trouble using ordinary gadgets (microwave, computer, phone):no  Forgets the month or year: no  Mismanaging finances: no  Remembering appts: no  Daily problems with thinking and/or memory: no Ad8 score is= 0  Immunization History  Administered Date(s) Administered  . Influenza Split 01/03/2011  . Influenza Whole 04/01/2005, 12/18/2009  . Influenza, High Dose Seasonal PF 01/07/2014, 01/27/2015, 01/02/2016  . Influenza-Unspecified 11/30/2012  . Pneumococcal Polysaccharide-23 07/15/2014   . Td 04/01/2001, 10/07/2013   Screening Tests Health Maintenance  Topic Date Due  . Hepatitis C Screening  07[birth year]  . PNA vac Low Risk Adult (2 of 2 - PCV13) 07/15/2015  . OPHTHALMOLOGY EXAM  12/12/2015  . MAMMOGRAM  04/01/2016  . INFLUENZA VACCINE  10/30/2016  . HEMOGLOBIN A1C  01/04/2017  . FOOT EXAM  04/10/2017  . COLONOSCOPY  06/20/2020  . TETANUS/TDAP  10/08/2023  . DEXA SCAN  Addressed      Plan:    Continue doing brain stimulating activities (puzzles, reading, adult coloring books, staying active) to keep memory sharp.   Continue to eat heart healthy diet (full of fruits, vegetables, whole grains, lean protein, water--limit salt, fat, and sugar intake) and increase physical activity as tolerated.  I have personally reviewed and noted the following in the patient's chart:   . Medical and social history . Use of alcohol, tobacco or illicit drugs  . Current medications and supplements . Functional ability and status . Nutritional status . Physical activity . Advanced directives . List of other physicians . Vitals . Screenings to include cognitive, depression, and falls . Referrals and appointments  In addition, I have reviewed and discussed with patient certain preventive protocols, quality metrics, and best practice recommendations. A written personalized care plan for preventive services as well as general preventive health recommendations were provided to patient.     Michiel Cowboy, RN  09/12/2016  Medical screening examination/treatment/procedure(s) were performed by non-physician practitioner and as supervising physician I was immediately available for consultation/collaboration. I agree with above. Cathlean Cower, MD

## 2016-09-12 NOTE — Progress Notes (Signed)
Subjective:    Patient ID: Marissa Roberts, female    DOB: Mar 19, 1946, 71 y.o.   MRN: [id number]  HPI    Here to f/u with me as PCP is out of office, overall doing ok,  Pt denies chest pain, increasing sob or doe, wheezing, orthopnea, PND, increased LE swelling, palpitations, dizziness or syncope.  Pt denies new neurological symptoms such as new headache, or facial or extremity weakness or numbness.  Pt denies polydipsia, polyuria, or low sugar episode.  Pt states overall only fair compliance with meds for DM, sometimes trying to follow appropriate diet, with wt overall stable,  but little exercise however. Having occas leg and neck cramps, had stopped her magnesium recently, also taking 2 diuretics. Denies hyper or hypo thyroid symptoms such as voice, skin or hair change. Past Medical History:  Diagnosis Date  . Arthritis   . Bronchitis    hx of  . CHF (congestive heart failure) ([place])   . Diabetes mellitus   . GERD (gastroesophageal reflux disease)   . Hypercholesteremia   . Hypertension   . Hypothyroidism   . Pneumonia    hx of  . PONV (postoperative nausea and vomiting)   . Thyroid disease    Past Surgical History:  Procedure Laterality Date  . ABDOMINAL HYSTERECTOMY     partial  . BREAST SURGERY     lumpectomy left breast  . carpel tunnel    . COLONOSCOPY W/ POLYPECTOMY    . FOOT SURGERY    . knee arhtroscopy    . SHOULDER ARTHROSCOPY WITH SUBACROMIAL DECOMPRESSION, ROTATOR CUFF REPAIR AND BICEP TENDON REPAIR Right 05/26/2012   Procedure: Right Shoulder Diagnostic Operative Arthroscopy, Subacromial Decompression, Biceps Tenodesis, Mini Open Rotator Cuff Repair;  Surgeon: [name], MD;  Location: [facility];  Service: Orthopedics;  Laterality: Right;  Right Shoulder Diagnostic Operative Arthroscopy, Subacromial Decompression, Biceps Tenodesis, Mini Open Rotator Cuff Repair  . TONSILLECTOMY      reports that she quit smoking about 26 years ago. She has never used  smokeless tobacco. She reports that she does not drink alcohol or use drugs. family history includes Diabetes in her mother; Heart disease in her mother; Hyperlipidemia in her mother. She was adopted. Allergies  Allergen Reactions  . Contrast Media [Iodinated Diagnostic Agents]     Throat swells from ct con tra  . Adhesive [Tape]     No paper tape, causes blisters  . Erythromycin     REACTION: rash  . Penicillins     REACTION: rash   Current Outpatient Prescriptions on File Prior to Visit  Medication Sig Dispense Refill  . amLODipine (NORVASC) 10 MG tablet Take 1 tablet (10 mg total) by mouth daily. 90 tablet 3  . Calcium Carbonate (CALCIUM 600) 1500 MG TABS Take 2 tablets by mouth daily.     . cyanocobalamin 100 MCG tablet Take 100 mcg by mouth daily.    Noelle Penner FIBER SUPPLEMENT PO Take by mouth.    . fluticasone (FLONASE) 50 MCG/ACT nasal spray Place 2 sprays into both nostrils daily. 48 g 1  . glucosamine-chondroitin 500-400 MG tablet Take 1 tablet by mouth daily.      Marland Kitchen glucose blood (ONETOUCH VERIO) test strip Please check blood sugars at least half the time about 2 hours after any meal and 3 times per day on waking up. Dx code E11.9 270 each 2  . insulin aspart (NOVOLOG FLEXPEN) 100 UNIT/ML FlexPen Inject  20 units before each meal 45  mL 1  . Insulin Degludec (TRESIBA FLEXTOUCH) 200 UNIT/ML SOPN Inject 52 Units into the skin every morning. (Patient taking differently: Inject 52 Units into the skin every evening. ) 45 mL 0  . Insulin Pen Needle (EASY TOUCH PEN NEEDLES) 31G X 8 MM MISC USE TO INJECT LANTUS AND NOVOLOG 4 TIMES PER DAY-DX CODE E11.9 400 each 2  . levothyroxine (SYNTHROID, LEVOTHROID) 137 MCG tablet TAKE ONE TABLET BY MOUTH ONCE DAILY 90 tablet 3  . LINZESS 290 MCG CAPS capsule Take 1 capsule (290 mcg total) by mouth daily. 90 capsule 3  . magnesium oxide (MAG-OX) 400 MG tablet Take 400 mg by mouth daily.    . metFORMIN (GLUCOPHAGE) 1000 MG tablet Take 1 tablet (1,000 mg  total) by mouth 2 (two) times daily with a meal. 180 tablet 0  . metFORMIN (GLUCOPHAGE) 1000 MG tablet TAKE ONE TABLET BY MOUTH TWICE DAILY WITH MEALS 180 tablet 0  . montelukast (SINGULAIR) 10 MG tablet Take 1 tablet (10 mg total) by mouth at bedtime. 30 tablet 3  . NOVOLOG FLEXPEN 100 UNIT/ML FlexPen INJECT UP TO 20 UNITS INTO THE SKIN BEFORE EACH MEAL 45 pen 1  . ONETOUCH DELICA LANCETS FINE MISC Please check blood sugars at least half the time about 2 hours after any meal and 3 times per week on waking up. Dx code E11.9 270 each 2  . potassium chloride (K-DUR) 10 MEQ tablet Take 1 tablet (10 mEq total) by mouth 2 (two) times daily. 180 tablet 3  . potassium chloride (KLOR-CON 10) 10 MEQ tablet Take 1 tablet (10 mEq total) by mouth daily. 30 tablet 3  . Probiotic Product (PROBIOTIC DAILY PO) Take by mouth.    . traMADol (ULTRAM) 50 MG tablet Take 1 tablet (50 mg total) by mouth every 12 (twelve) hours as needed. 30 tablet 0  . TRESIBA FLEXTOUCH 200 UNIT/ML SOPN INJECT 52 UNITS INTO THE SKIN EVERY MORNING. 3 pen 1  . valsartan-hydrochlorothiazide (DIOVAN HCT) 160-12.5 MG tablet Take 1 tablet by mouth daily. 90 tablet 2  . [DISCONTINUED] Saxagliptin-Metformin 07-998 MG TB24 Take 1 tablet by mouth daily. 30 tablet    No current facility-administered medications on file prior to visit.    Review of Systems  Constitutional: Negative for other unusual diaphoresis or sweats HENT: Negative for ear discharge or swelling Eyes: Negative for other worsening visual disturbances Respiratory: Negative for stridor or other swelling  Gastrointestinal: Negative for worsening distension or other blood Genitourinary: Negative for retention or other urinary change Musculoskeletal: Negative for other MSK pain or swelling Skin: Negative for color change or other new lesions Neurological: Negative for worsening tremors and other numbness  Psychiatric/Behavioral: Negative for worsening agitation or other  fatigue All other system neg per pt    Objective:   Physical Exam BP 130/84   Pulse 81   Ht 5\' 5"  (1.651 m)   Wt 243 lb (110.2 kg)   SpO2 100%   BMI 40.44 kg/m  VS noted,  Constitutional: Pt appears in NAD HENT: Head: NCAT.  Right Ear: External ear normal.  Left Ear: External ear normal.  Eyes: . Pupils are equal, round, and reactive to light. Conjunctivae and EOM are normal Nose: without d/c or deformity Neck: Neck supple. Gross normal ROM Cardiovascular: Normal rate and regular rhythm.   Pulmonary/Chest: Effort normal and breath sounds without rales or wheezing.  Abd:  Soft, NT, ND, + BS, no organomegaly Neurological: Pt is alert. At baseline orientation, motor grossly intact  Skin: Skin is warm. No rashes, other new lesions, trace to 1+ bilat LE edema Psychiatric: Pt behavior is normal without agitation  No other exam findings    Assessment & Plan:

## 2016-09-12 NOTE — Progress Notes (Signed)
Pre visit review using our clinic review tool, if applicable. No additional management support is needed unless otherwise documented below in the visit note. 

## 2016-09-15 NOTE — Assessment & Plan Note (Signed)
Lab Results  Component Value Date   TSH 0.25 (L) 07/05/2016  Pt encourage to f/u with endo as planned

## 2016-09-15 NOTE — Assessment & Plan Note (Signed)
With chronic edema, Mild, stable overall by history and exam, recent data reviewed with pt, and pt to continue medical treatment as before,  to f/u any worsening symptoms or concerns BP Readings from Last 3 Encounters:  09/12/16 130/84  08/12/16 138/78  07/09/16 140/82

## 2016-09-15 NOTE — Assessment & Plan Note (Signed)
With fair compliance only, o/w stable overall by history and exam, recent data reviewed with pt, and pt to continue medical treatment as before,  to f/u any worsening symptoms or concerns Lab Results  Component Value Date   HGBA1C 7.8 (H) 07/05/2016

## 2016-09-15 NOTE — Assessment & Plan Note (Signed)
Goal ldl < 70, for crestor increased dose, lower chol diet,  to f/u any worsening symptoms or concerns

## 2016-09-18 DIAGNOSIS — K625 Hemorrhage of anus and rectum: Secondary | ICD-10-CM | POA: Diagnosis not present

## 2016-10-05 ENCOUNTER — Other Ambulatory Visit: Payer: Self-pay | Admitting: Internal Medicine

## 2016-10-08 ENCOUNTER — Other Ambulatory Visit (INDEPENDENT_AMBULATORY_CARE_PROVIDER_SITE_OTHER): Payer: Medicare Other

## 2016-10-08 DIAGNOSIS — E063 Autoimmune thyroiditis: Secondary | ICD-10-CM

## 2016-10-08 DIAGNOSIS — Z794 Long term (current) use of insulin: Secondary | ICD-10-CM

## 2016-10-08 DIAGNOSIS — E1165 Type 2 diabetes mellitus with hyperglycemia: Secondary | ICD-10-CM | POA: Diagnosis not present

## 2016-10-08 LAB — BASIC METABOLIC PANEL
BUN: 13 mg/dL (ref 6–23)
CALCIUM: 9.9 mg/dL (ref 8.4–10.5)
CO2: 29 meq/L (ref 19–32)
Chloride: 100 mEq/L (ref 96–112)
Creatinine, Ser: 0.8 mg/dL (ref 0.40–1.20)
GFR: 91.43 mL/min (ref >60.00)
GLUCOSE: 183 mg/dL — AB (ref 70–99)
Potassium: 4.1 mEq/L (ref 3.5–5.1)
Sodium: 140 mEq/L (ref 135–145)

## 2016-10-08 LAB — MICROALBUMIN / CREATININE URINE RATIO
Creatinine,U: 122.9 mg/dL
Microalb Creat Ratio: 72.7 mg/g — ABNORMAL HIGH (ref 0.0–30.0)
Microalb, Ur: 89.4 mg/dL — ABNORMAL HIGH (ref 0.0–1.9)

## 2016-10-08 LAB — HEMOGLOBIN A1C: HEMOGLOBIN A1C: 8 % — AB (ref 4.6–6.5)

## 2016-10-08 LAB — TSH: TSH: 1.13 u[IU]/mL (ref 0.35–4.50)

## 2016-10-11 ENCOUNTER — Ambulatory Visit (INDEPENDENT_AMBULATORY_CARE_PROVIDER_SITE_OTHER): Payer: Medicare Other | Admitting: Endocrinology

## 2016-10-11 ENCOUNTER — Encounter: Payer: Self-pay | Admitting: Endocrinology

## 2016-10-11 VITALS — BP 140/84 | HR 79 | Ht 65.0 in | Wt 241.6 lb

## 2016-10-11 DIAGNOSIS — M25472 Effusion, left ankle: Secondary | ICD-10-CM | POA: Diagnosis not present

## 2016-10-11 DIAGNOSIS — I10 Essential (primary) hypertension: Secondary | ICD-10-CM

## 2016-10-11 DIAGNOSIS — E063 Autoimmune thyroiditis: Secondary | ICD-10-CM

## 2016-10-11 DIAGNOSIS — E1165 Type 2 diabetes mellitus with hyperglycemia: Secondary | ICD-10-CM

## 2016-10-11 DIAGNOSIS — Z794 Long term (current) use of insulin: Secondary | ICD-10-CM

## 2016-10-11 DIAGNOSIS — M25471 Effusion, right ankle: Secondary | ICD-10-CM

## 2016-10-11 NOTE — Patient Instructions (Addendum)
Check blood sugars on waking up  3/7 days  Also check blood sugars about 2 hours after a meal and do this after different meals by rotation  Recommended blood sugar levels on waking up is 90-130 and about 2 hours after meal is 130-160  Please bring your blood sugar monitor to each visit, thank you  Take 58 Tresiba daily  Take insulin just before eating at the restaurant

## 2016-10-11 NOTE — Progress Notes (Signed)
Patient ID: Marissa Roberts, female   DOB: 03-08-1946, 71 y.o.   MRN: [id number]           Reason for Appointment:  Follow-up for diabetes and hypertension   History of Present Illness:    HYPERTENSION   Her blood pressure has been treated by various medications in the past  She does not monitor at home  Currently on amlodipine 10 mg and also valsartan HCTZ 160/12.5 Blood pressure appears to be relatively well-controlled, high normal  diastolictoday  She also has had problems chronically with  Leg edema, taking Lasix twice a day Thinks she has more swelling recently Recent blood pressure office readings:  BP Readings from Last 3 Encounters:  10/11/16 140/84  09/12/16 130/84  08/12/16 138/78            Type 2 diabetes mellitus, date of diagnosis: 2005        Recent history:   INSULIN regimen is  Tresiba 54 units hs, Novolog 15-20 units bid- tid   Oral hypoglycemic drugs the patient is taking are: Metformin 2 g daily  Her blood sugars are persistently poorly controlled with A1c now 8%  Current blood sugar patterns, management and problems identified:  FASTING blood sugars are fair with an average of 141  She still does not check readings after meals and only a couple of times in the early afternoon  She has been repeatedly told to check readings after meals so we can adjust her NovoLog insulin and identify blood sugar patterns do better control her A1c  She is eating out in the evening every day and will sometimes take her insulin before she leaves arm and sometimes later at home but does not get hypoglycemic with even 20 units at suppertime  More recently her fasting readings are mostly high  She thinks she is trying to be walking around more at various places although not doing any brisk walking for exercise     Side effects from medications have been:none  Compliance with the medical regimen: Fair  Glucose monitoring:  done 1-2 time a day         Glucometer:   One Touch.      Blood Glucose readings from monitor download show RANGE 99-191, MEDIAN 139   Self-care:   Meals: 3 meals per day. Breakfast is yogurt or toast, bacon, egg; usually not a lot of carbohydrates, rarely fried food including fish.    Not always restricting fat intake or sweet tea sometimes when eating out        Exercise:   Walking, getting 5000 steps on average daily  Dietician visit, most recent:2014.               Weight history:  Wt Readings from Last 3 Encounters:  10/11/16 241 lb 9.6 oz (109.6 kg)  09/12/16 243 lb (110.2 kg)  08/12/16 242 lb (109.8 kg)    Glycemic control:   Lab Results  Component Value Date   HGBA1C 8.0 (H) 10/08/2016   HGBA1C 7.8 (H) 07/05/2016   HGBA1C 7.9 (H) 04/05/2016   Lab Results  Component Value Date   MICROALBUR 89.4 (H) 10/08/2016   LDLCALC 94 07/05/2016   CREATININE 0.80 10/08/2016   No results found for: FRUCTOSAMINE    Past history:  She thinks her blood sugars were only mildly increased at borderline levels at the onsetper Most likely she was treated with metformin initially and this has been continued.   At some point she was  also tried on [place] and [place] for improving her control. She is not sure why she was started on insulin 5 years ago, presumably for worsening hyperglycemia Also not clear if she has tried various insulin regimens before starting Lantus and NovoLog Her A1c has been usually over 7% and gradually increasing over the last year  Previously had tried the V-go pump but subsequently she stopped doing the V-go pump because of the cost, skin irritation and local discomfort   Allergies as of 10/11/2016      Reactions   Contrast Media [iodinated Diagnostic Agents]    Throat swells from ct con tra   Adhesive [tape]    No paper tape, causes blisters   Erythromycin    REACTION: rash   Penicillins    REACTION: rash      Medication List       Accurate as of 10/11/16  9:59 AM. Always use your  most recent med list.          amLODipine 10 MG tablet Commonly known as:  NORVASC Take 1 tablet (10 mg total) by mouth daily.   CALCIUM 600 1500 (600 Ca) MG Tabs tablet Generic drug:  calcium carbonate Take 2 tablets by mouth daily.   cyanocobalamin 100 MCG tablet Take 100 mcg by mouth daily.   EQ FIBER SUPPLEMENT PO Take by mouth.   fluticasone 50 MCG/ACT nasal spray Commonly known as:  FLONASE Place 2 sprays into both nostrils daily.   furosemide 40 MG tablet Commonly known as:  LASIX TAKE 2 TABLETS BY MOUTH EVERY DAY   glucosamine-chondroitin 500-400 MG tablet Take 1 tablet by mouth daily.   glucose blood test strip Commonly known as:  ONETOUCH VERIO Please check blood sugars at least half the time about 2 hours after any meal and 3 times per day on waking up. Dx code E11.9   Insulin Pen Needle 31G X 8 MM Misc Commonly known as:  EASY TOUCH PEN NEEDLES USE TO INJECT LANTUS AND NOVOLOG 4 TIMES PER DAY-DX CODE E11.9   levothyroxine 137 MCG tablet Commonly known as:  SYNTHROID, LEVOTHROID TAKE ONE TABLET BY MOUTH ONCE DAILY   LINZESS 290 MCG Caps capsule Generic drug:  linaclotide Take 1 capsule (290 mcg total) by mouth daily.   magnesium oxide 400 MG tablet Commonly known as:  MAG-OX Take 400 mg by mouth daily.   metFORMIN 1000 MG tablet Commonly known as:  GLUCOPHAGE TAKE ONE TABLET BY MOUTH TWICE DAILY WITH MEALS   montelukast 10 MG tablet Commonly known as:  SINGULAIR Take 1 tablet (10 mg total) by mouth at bedtime.   NOVOLOG FLEXPEN 100 UNIT/ML FlexPen Generic drug:  insulin aspart INJECT UP TO 20 UNITS INTO THE SKIN BEFORE EACH MEAL   ONETOUCH DELICA LANCETS FINE Misc Please check blood sugars at least half the time about 2 hours after any meal and 3 times per week on waking up. Dx code E11.9   potassium chloride 10 MEQ tablet Commonly known as:  KLOR-CON 10 Take 1 tablet (10 mEq total) by mouth daily.   potassium chloride 10 MEQ  tablet Commonly known as:  K-DUR Take 1 tablet (10 mEq total) by mouth 2 (two) times daily.   PROBIOTIC DAILY PO Take by mouth.   rosuvastatin 40 MG tablet Commonly known as:  CRESTOR Take 1 tablet (40 mg total) by mouth daily.   traMADol 50 MG tablet Commonly known as:  ULTRAM Take 1 tablet (50 mg total) by mouth every 12 (  twelve) hours as needed.   TRESIBA FLEXTOUCH 200 UNIT/ML Sopn Generic drug:  Insulin Degludec INJECT 52 UNITS INTO THE SKIN EVERY MORNING.   valsartan-hydrochlorothiazide 160-12.5 MG tablet Commonly known as:  DIOVAN HCT Take 1 tablet by mouth daily.       Allergies:  Allergies  Allergen Reactions  . Contrast Media [Iodinated Diagnostic Agents]     Throat swells from ct con tra  . Adhesive [Tape]     No paper tape, causes blisters  . Erythromycin     REACTION: rash  . Penicillins     REACTION: rash    Past Medical History:  Diagnosis Date  . Arthritis   . Bronchitis    hx of  . CHF (congestive heart failure) ([place])   . Diabetes mellitus   . GERD (gastroesophageal reflux disease)   . Hypercholesteremia   . Hypertension   . Hypothyroidism   . Pneumonia    hx of  . PONV (postoperative nausea and vomiting)   . Thyroid disease     Past Surgical History:  Procedure Laterality Date  . ABDOMINAL HYSTERECTOMY     partial  . BREAST SURGERY     lumpectomy left breast  . carpel tunnel    . COLONOSCOPY W/ POLYPECTOMY    . FOOT SURGERY    . knee arhtroscopy    . SHOULDER ARTHROSCOPY WITH SUBACROMIAL DECOMPRESSION, ROTATOR CUFF REPAIR AND BICEP TENDON REPAIR Right 05/26/2012   Procedure: Right Shoulder Diagnostic Operative Arthroscopy, Subacromial Decompression, Biceps Tenodesis, Mini Open Rotator Cuff Repair;  Surgeon: [name], MD;  Location: [facility];  Service: Orthopedics;  Laterality: Right;  Right Shoulder Diagnostic Operative Arthroscopy, Subacromial Decompression, Biceps Tenodesis, Mini Open Rotator Cuff Repair  . TONSILLECTOMY       Family History  Problem Relation Age of Onset  . Adopted: Yes  . Diabetes Mother   . Heart disease Mother   . Hyperlipidemia Mother   . Coronary artery disease Unknown     Social History:  reports that she quit smoking about 26 years ago. She has never used smokeless tobacco. She reports that she does not drink alcohol or use drugs.    Review of Systems      LIPIDS: followed by PCP. She is taking Crestor 40 mg, previously LDL was high with simvastatin 20 mg LDL below 100 Consistently       Lab Results  Component Value Date   CHOL 170 07/05/2016   HDL 56.60 07/05/2016   LDLCALC 94 07/05/2016   LDLDIRECT 129.5 02/06/2012   TRIG 99.0 07/05/2016   CHOLHDL 3 07/05/2016                  Thyroid:     She has been hypothyroid for Over 40 years and is taking a supplement of 137 g levothyroxine  Taking this recently 6-1/2 tablets per week as TSH was low in April, TSH now improved   The following labs   Lab Results  Component Value Date   TSH 1.13 10/08/2016   TSH 0.25 (L) 07/05/2016   TSH 1.27 09/26/2015   FREET4 1.03 09/26/2015   FREET4 1.03 06/28/2010   FREET4 1.36 11/27/2007       Physical Examination:  BP 140/84   Pulse 79   Ht 5\' 5"  (1.651 m)   Wt 241 lb 9.6 oz (109.6 kg)   SpO2 98%   BMI 40.20 kg/m     she has 1+ ankle edema on the left only, minimal  on the right side No lower leg edema  ASSESSMENT/PLAN:  DIABETES:  See history of present illness for detailed discussion of current diabetes management, blood sugar patterns and problems identified  A1c is 8; this has been persistently high  Again fasting blood sugars are averaging about 140 Likely has high readings, probably even over 200 at times She is eating out regularly in the evenings and unlikely she can follow consistent diet including having sweet tea and higher fat meals She has not been able to lose weight even though she is trying to be more active  Recommendations:  We will  analyze her readings with the freestyle [place] professional sensor She will keep a record of her diet and also will need review with dietitian or nurse educator to help improve dietary compliance Discussed importance of getting a better blood sugar pattern to help adjust her insulin as well as improved meal planning For now she can increase her Tresiba to 81 especially since recent fasting readings are higher Will review her patterns after a data is available in about 3-4 weeks   HYPERTENSION: Fairly well controlled with triple drug regimen of amlodipine/valsartan/HCT  EDEMA: This is currently well controlled, may have minimal ankle edema related to amlodipine but this is not symptomatic Explained that she is to continue same dose of amlodipine so that blood pressure does not go up   HYPOTHYROIDISM: Adequately controlled and she will continue the same regimen with 6-1/2 tablets a week    Patient Instructions  Check blood sugars on waking up  3/7 days  Also check blood sugars about 2 hours after a meal and do this after different meals by rotation  Recommended blood sugar levels on waking up is 90-130 and about 2 hours after meal is 130-160  Please bring your blood sugar monitor to each visit, thank you  Take 58 Tresiba daily  Take insulin just before eating at the restaurant  Counseling time on subjects discussed in assessment and plan sections is over 50% of today's 25 minute visit   Arihaan Bellucci 10/11/2016, 9:59 AM   Note: This office note was prepared with Dragon voice recognition system technology. Any transcriptional errors that result from this process are unintentional.

## 2016-10-21 ENCOUNTER — Other Ambulatory Visit: Payer: Self-pay | Admitting: Endocrinology

## 2016-10-21 ENCOUNTER — Encounter: Payer: Medicare Other | Attending: Endocrinology | Admitting: Nutrition

## 2016-10-21 DIAGNOSIS — E118 Type 2 diabetes mellitus with unspecified complications: Principal | ICD-10-CM

## 2016-10-21 DIAGNOSIS — E1165 Type 2 diabetes mellitus with hyperglycemia: Secondary | ICD-10-CM

## 2016-10-21 NOTE — Patient Instructions (Signed)
Keep food, and insulin dose record on sheet given, and return in 2 weeks.

## 2016-10-21 NOTE — Progress Notes (Signed)
A Freestyle [place] professional sensor was inserted in patient's left upper, outer arm with no discomfort.  It was activated, and she was given a sheet and instructed to record all meals, insulin doses and activities on sheet provided. And to bring the sheet back in 2 weeks.  She agreed to do this. She will return in 2 weeks for removal.  She had no final questions.

## 2016-10-22 ENCOUNTER — Encounter: Payer: Medicare Other | Admitting: Nutrition

## 2016-10-25 DIAGNOSIS — Z83511 Family history of glaucoma: Secondary | ICD-10-CM | POA: Diagnosis not present

## 2016-10-25 DIAGNOSIS — E119 Type 2 diabetes mellitus without complications: Secondary | ICD-10-CM | POA: Diagnosis not present

## 2016-10-25 DIAGNOSIS — H40023 Open angle with borderline findings, high risk, bilateral: Secondary | ICD-10-CM | POA: Diagnosis not present

## 2016-10-25 DIAGNOSIS — H40053 Ocular hypertension, bilateral: Secondary | ICD-10-CM | POA: Diagnosis not present

## 2016-10-25 LAB — HM DIABETES EYE EXAM

## 2016-11-04 ENCOUNTER — Encounter: Payer: Medicare Other | Attending: Endocrinology | Admitting: Nutrition

## 2016-11-04 ENCOUNTER — Ambulatory Visit: Payer: Medicare Other | Admitting: Endocrinology

## 2016-11-05 ENCOUNTER — Encounter: Payer: Medicare Other | Admitting: Nutrition

## 2016-11-06 ENCOUNTER — Telehealth: Payer: Self-pay | Admitting: Endocrinology

## 2016-11-06 NOTE — Telephone Encounter (Signed)
Message left on her home machine to reduce the Tresiba to 54u and to make sure she takes her Novolog before supper and not after.  She was told that if she is taking the Novolog before her supper meal, than reduce the dose to 15u She was told to take lunchtime insulin acL if eating, and to test as written below.  She was told that if she has questions, to call the office in the AM.

## 2016-11-06 NOTE — Telephone Encounter (Signed)
Please let her know that her continuous glucose monitoring showed low blood sugars late at night and she needs to cut back her [place] A[place] down to 54 instead of 58 She needs to make sure she is taking her NovoLog BEFORE evening meal and not after If she is taking it before she probably needs to cut it back to 15 units Also needs to take lunchtime insulin consistently She will need to check blood sugars midafternoon and bedtime to show me on the next visit next week

## 2016-11-10 NOTE — Progress Notes (Signed)
Patient ID: Marissa Roberts, female   DOB: 13-Oct-1945, 71 y.o.   MRN: [id number]               Reason for Appointment:  Follow-up for diabetes and hypertension   History of Present Illness:             Type 2 diabetes mellitus, date of diagnosis: 2005        Recent history:   INSULIN regimen is  Tresiba 54 units hs, Novolog 15-20 units bid- tid   Oral hypoglycemic drugs the patient is taking are: Metformin 2 g daily  Her blood sugars are persistently poorly controlled with A1c 8%   .CONTINUOUS GLUCOSE MONITORING RECORD INTERPRETATION    Dates of Recording: 10/21/16 through 11/04/16  Sensor  summary:  Average blood glucose for the period of recording is 120        Glycemic patterns:  Hyperglycemic episodes usually are occurring in the early afternoon but occasionally after breakfast and rarely after evening meal Hypoglycemic episodes occurred late at night almost every day of recording except 7/28 and 7/29     Overnight periods:  Blood sugars are frequently LOW after about 10 PM and back to normal around 2 AM and subsequently gradually rising.  This is a fairly consistent pattern except on 2 occasions     Preprandial periods:  Blood sugars around breakfast time which is 8-9 AM are averaging about 127 Average blood sugar before lunch is about 149 Blood sugars before dinner between 7-9 PM have an average of about 130     Postprandial periods:  Blood sugars AFTER breakfast are significantly high only on one of the days and most days they are not significantly high.  Blood sugars AFTER LUNCH are mostly high but are the highest of the day with several readings over 200 around 2 PM.  Blood sugars were not significantly high only about 4 of the days recorded.  Blood sugars after evening meal around 6 PM are variably high with significant rise only on one or 2 occasions up to around 220     Hypoglycemia: This is occurring fairly regularly between 10 PM-2 AM as discussed above       Current blood sugar patterns, management and problems identified:  FASTING blood sugars are fair with an average of 141  She still does not check readings after meals despite reminding her last week  On her dietary history when she was doing her CGM she has frequently high fat meals with fried food or gravy as well as hamburgers  Also will sometimes have sweet tea  She did not make entries about her insulin doses or whether she took the insulin when she was eating out  She thinks she is trying to do better with taking insulin before meals when she is going out  Does not appear to have symptoms of hyperglycemia when blood sugars on CGM are relatively low around midnight     Side effects from medications have been:none  Compliance with the medical regimen: Fair  Glucose monitoring:  done 1-2 time a day         Glucometer:  One Touch.      Blood Glucose readings from monitor download show  Mean values apply above for all meters except median for One Touch  PRE-MEAL Fasting Lunch Dinner Bedtime Overall  Glucose range:  90-1 66       Mean/median:      123      Self-care:  Meals: 3 meals per day. Breakfast is yogurt or toast, bacon, egg; usually not a lot of carbohydrates, Periodically fried food including fish, french fries.    Not always restricting fat intake or sweet tea sometimes when eating out        Exercise:   Walking, getting 5000 steps on average daily  Dietician visit, most recent:2014.               Weight history:  Wt Readings from Last 3 Encounters:  11/11/16 243 lb (110.2 kg)  10/11/16 241 lb 9.6 oz (109.6 kg)  09/12/16 243 lb (110.2 kg)    Glycemic control:   Lab Results  Component Value Date   HGBA1C 8.0 (H) 10/08/2016   HGBA1C 7.8 (H) 07/05/2016   HGBA1C 7.9 (H) 04/05/2016   Lab Results  Component Value Date   MICROALBUR 89.4 (H) 10/08/2016   LDLCALC 94 07/05/2016   CREATININE 0.80 10/08/2016   No results found for: FRUCTOSAMINE     Past history:  She thinks her blood sugars were only mildly increased at borderline levels at the onsetper Most likely she was treated with metformin initially and this has been continued.   At some point she was also tried on [place] and [place] for improving her control. She is not sure why she was started on insulin 5 years ago, presumably for worsening hyperglycemia Also not clear if she has tried various insulin regimens before starting Lantus and NovoLog Her A1c has been usually over 7% and gradually increasing over the last year  Previously had tried the V-go pump but subsequently she stopped doing the V-go pump because of the cost, skin irritation and local discomfort   Allergies as of 11/11/2016      Reactions   Contrast Media [iodinated Diagnostic Agents]    Throat swells from ct con tra   Adhesive [tape]    No paper tape, causes blisters   Erythromycin    REACTION: rash   Penicillins    REACTION: rash      Medication List       Accurate as of 11/11/16 12:36 PM. Always use your most recent med list.          amLODipine 10 MG tablet Commonly known as:  NORVASC Take 1 tablet (10 mg total) by mouth daily.   CALCIUM 600 1500 (600 Ca) MG Tabs tablet Generic drug:  calcium carbonate Take 2 tablets by mouth daily.   cyanocobalamin 100 MCG tablet Take 100 mcg by mouth daily.   EQ FIBER SUPPLEMENT PO Take by mouth.   fluticasone 50 MCG/ACT nasal spray Commonly known as:  FLONASE Place 2 sprays into both nostrils daily.   furosemide 40 MG tablet Commonly known as:  LASIX TAKE 2 TABLETS BY MOUTH EVERY DAY   glucosamine-chondroitin 500-400 MG tablet Take 1 tablet by mouth daily.   glucose blood test strip Commonly known as:  ONETOUCH VERIO Please check blood sugars at least half the time about 2 hours after any meal and 3 times per day on waking up. Dx code E11.9   Insulin Pen Needle 31G X 8 MM Misc Commonly known as:  EASY TOUCH PEN NEEDLES USE TO  INJECT LANTUS AND NOVOLOG 4 TIMES PER DAY-DX CODE E11.9   levothyroxine 137 MCG tablet Commonly known as:  SYNTHROID, LEVOTHROID TAKE ONE TABLET BY MOUTH ONCE DAILY   LINZESS 290 MCG Caps capsule Generic drug:  linaclotide Take 1 capsule (290 mcg total) by mouth daily.   magnesium  oxide 400 MG tablet Commonly known as:  MAG-OX Take 400 mg by mouth daily.   metFORMIN 1000 MG tablet Commonly known as:  GLUCOPHAGE TAKE 1 TABLET BY MOUTH TWICE DAILY WITH MEALS   montelukast 10 MG tablet Commonly known as:  SINGULAIR Take 1 tablet (10 mg total) by mouth at bedtime.   NOVOLOG FLEXPEN 100 UNIT/ML FlexPen Generic drug:  insulin aspart INJECT UP TO 20 UNITS INTO THE SKIN BEFORE EACH MEAL   ONETOUCH DELICA LANCETS FINE Misc Please check blood sugars at least half the time about 2 hours after any meal and 3 times per week on waking up. Dx code E11.9   potassium chloride 10 MEQ tablet Commonly known as:  K-DUR Take 1 tablet (10 mEq total) by mouth 2 (two) times daily.   PROBIOTIC DAILY PO Take by mouth.   rosuvastatin 40 MG tablet Commonly known as:  CRESTOR Take 1 tablet (40 mg total) by mouth daily.   traMADol 50 MG tablet Commonly known as:  ULTRAM Take 1 tablet (50 mg total) by mouth every 12 (twelve) hours as needed.   TRESIBA FLEXTOUCH 200 UNIT/ML Sopn Generic drug:  Insulin Degludec INJECT 52 UNITS INTO THE SKIN EVERY MORNING.   valsartan-hydrochlorothiazide 160-12.5 MG tablet Commonly known as:  DIOVAN HCT Take 1 tablet by mouth daily.       Allergies:  Allergies  Allergen Reactions  . Contrast Media [Iodinated Diagnostic Agents]     Throat swells from ct con tra  . Adhesive [Tape]     No paper tape, causes blisters  . Erythromycin     REACTION: rash  . Penicillins     REACTION: rash    Past Medical History:  Diagnosis Date  . Arthritis   . Bronchitis    hx of  . CHF (congestive heart failure) ([place])   . Diabetes mellitus   . GERD  (gastroesophageal reflux disease)   . Hypercholesteremia   . Hypertension   . Hypothyroidism   . Pneumonia    hx of  . PONV (postoperative nausea and vomiting)   . Thyroid disease     Past Surgical History:  Procedure Laterality Date  . ABDOMINAL HYSTERECTOMY     partial  . BREAST SURGERY     lumpectomy left breast  . carpel tunnel    . COLONOSCOPY W/ POLYPECTOMY    . FOOT SURGERY    . knee arhtroscopy    . SHOULDER ARTHROSCOPY WITH SUBACROMIAL DECOMPRESSION, ROTATOR CUFF REPAIR AND BICEP TENDON REPAIR Right 05/26/2012   Procedure: Right Shoulder Diagnostic Operative Arthroscopy, Subacromial Decompression, Biceps Tenodesis, Mini Open Rotator Cuff Repair;  Surgeon: [name], MD;  Location: [facility];  Service: Orthopedics;  Laterality: Right;  Right Shoulder Diagnostic Operative Arthroscopy, Subacromial Decompression, Biceps Tenodesis, Mini Open Rotator Cuff Repair  . TONSILLECTOMY      Family History  Problem Relation Age of Onset  . Adopted: Yes  . Diabetes Mother   . Heart disease Mother   . Hyperlipidemia Mother   . Coronary artery disease Unknown     Social History:  reports that she quit smoking about 26 years ago. She has never used smokeless tobacco. She reports that she does not drink alcohol or use drugs.    Review of Systems    LIPIDS: followed by PCP. She is taking Crestor 40 mg, previously LDL was high with simvastatin 20 mg LDL below 100        Lab Results  Component Value Date  CHOL 170 07/05/2016   HDL 56.60 07/05/2016   LDLCALC 94 07/05/2016   LDLDIRECT 129.5 02/06/2012   TRIG 99.0 07/05/2016   CHOLHDL 3 07/05/2016                  Thyroid:     She has been hypothyroid for Over 40 years and is taking a supplement of 137 g levothyroxine, 6-1/2 tablets per week  Last labs:   Lab Results  Component Value Date   TSH 1.13 10/08/2016   TSH 0.25 (L) 07/05/2016   TSH 1.27 09/26/2015   FREET4 1.03 09/26/2015   FREET4 1.03 06/28/2010    FREET4 1.36 11/27/2007       Physical Examination:  BP 126/68   Pulse 84   Ht 5\' 5"  (1.651 m)   Wt 243 lb (110.2 kg)   SpO2 97%   BMI 40.44 kg/m     she has 1+ ankle edema on the left only, minimal on the right side No lower leg edema  ASSESSMENT/PLAN:  DIABETES:  See history of present illness for detailed discussion of current diabetes management, blood sugar patterns and problems identified  Patient is here for follow-up discussion after her CGM regarding  Most of her hyperglycemia is related to poor diet especially when eating out  She is also not watching her diet at breakfast or lunch periodically Most of her readings are higher after either breakfast or lunch and only occasionally after evening meal depending on what she is eating She does not have consistent control of her high fat or high sugar intake She may be doing a little better with mealtime insulin when eating out but she did not document her insulin dosages on her CGM diary  A1c is 8; this has been persistently high   Recommendations:   She needs to see the dietitian but she does not want to do so and she thinks she knows what to do  Emphasized the need to improve her diet as discussed above  Switch [place] to the morning  Start checking some readings at bedtime at least  Take only 15 units of NovoLog at suppertime unless eating a larger meal or eating out  Take NovoLog insulin consistently at breakfast or lunch     Patient Instructions  Low fat diet  T[place]o in am  If larger meals in pms may take 20 Units otherwise 15  Check at bedtime     Marissa Roberts 11/11/2016, 12:36 PM   Note: This office note was prepared with Dragon voice recognition system technology. Any transcriptional errors that result from this process are unintentional.

## 2016-11-11 ENCOUNTER — Encounter: Payer: Self-pay | Admitting: Endocrinology

## 2016-11-11 ENCOUNTER — Ambulatory Visit (INDEPENDENT_AMBULATORY_CARE_PROVIDER_SITE_OTHER): Payer: Medicare Other | Admitting: Endocrinology

## 2016-11-11 VITALS — BP 126/68 | HR 84 | Ht 65.0 in | Wt 243.0 lb

## 2016-11-11 DIAGNOSIS — Z794 Long term (current) use of insulin: Secondary | ICD-10-CM | POA: Diagnosis not present

## 2016-11-11 DIAGNOSIS — E1165 Type 2 diabetes mellitus with hyperglycemia: Secondary | ICD-10-CM

## 2016-11-11 NOTE — Patient Instructions (Signed)
Return to see Dr. Dwyane Dee next week.

## 2016-11-11 NOTE — Progress Notes (Signed)
Sensor was remove from arm with no redness or swelling noted.  It was downloaded and questions were asked about meal time insulin and quantiites eaten. Download was put on Dr. Ronnie Derby desk.

## 2016-11-11 NOTE — Patient Instructions (Signed)
Low fat diet  Toujeo in am  If larger meals in pms may take 20 Units otherwise 15  Check at bedtime

## 2016-11-13 ENCOUNTER — Ambulatory Visit: Payer: Medicare Other | Admitting: Endocrinology

## 2016-11-21 DIAGNOSIS — K649 Unspecified hemorrhoids: Secondary | ICD-10-CM | POA: Diagnosis not present

## 2016-11-21 DIAGNOSIS — K635 Polyp of colon: Secondary | ICD-10-CM | POA: Diagnosis not present

## 2016-11-21 DIAGNOSIS — K648 Other hemorrhoids: Secondary | ICD-10-CM | POA: Diagnosis not present

## 2016-11-21 DIAGNOSIS — K625 Hemorrhage of anus and rectum: Secondary | ICD-10-CM | POA: Diagnosis not present

## 2016-11-26 ENCOUNTER — Other Ambulatory Visit: Payer: Self-pay | Admitting: Endocrinology

## 2016-12-03 ENCOUNTER — Encounter: Payer: Self-pay | Admitting: Family Medicine

## 2016-12-03 ENCOUNTER — Ambulatory Visit (INDEPENDENT_AMBULATORY_CARE_PROVIDER_SITE_OTHER): Payer: Medicare Other | Admitting: Family Medicine

## 2016-12-03 VITALS — BP 140/78 | HR 82 | Temp 98.2°F | Ht 65.0 in | Wt 240.0 lb

## 2016-12-03 DIAGNOSIS — J069 Acute upper respiratory infection, unspecified: Secondary | ICD-10-CM | POA: Diagnosis not present

## 2016-12-03 MED ORDER — HYDROCODONE-HOMATROPINE 5-1.5 MG/5ML PO SYRP: 5.0000 mL | ORAL_SOLUTION | Freq: Three times a day (TID) | ORAL | 0 refills | Status: DC | PRN

## 2016-12-03 NOTE — Patient Instructions (Addendum)
Thank you for coming in,   Please try things such as zyrtec-D or allegra-D which is an antihistamine and decongestant.   Please try afrin which will help with nasal congestion but use for only three days.   Please also try using a netti pot on a regular occasion.  Honey can help with a sore throat.   Please follow up with me if your symptoms do not improve.    Please feel free to call with any questions or concerns at any time, at [phone]. --Dr. Raeford Razor

## 2016-12-03 NOTE — Progress Notes (Addendum)
Marissa Roberts - 71 y.o. female MRN [id number]  Date of birth: 04/09/1945  SUBJECTIVE:  Including CC & ROS.  Chief Complaint  Patient presents with  . Congestion    started over the weekend, patient states that she is coughing up phlegm and it is starting to turn a darker color. patient states she has a sore throat and is coughing so much her side is hurting   [name] is a 71 year old female is presenting with cough. She reports her symptoms started on Saturday. She is having some sputum production. Her symptoms seem to be worse with lying down. She denies any leg swelling. She has not had any fevers but has felt warm. She has taken some sinus medication. She reports no history of COPD or asthma. She has coughed so much that her ribs of started hurting. She has also tried a daytime and nighttime medication.   Review of her chest x-ray from 06/27/14 shows hyperinflation. Review of her blood work from 10/08/16 shows an A1c of 8, TSH being normal, and normal kidney function electrolytes.  Review of Systems  Constitutional: Negative for fever.  HENT: Positive for sore throat.   Respiratory: Positive for cough.   Cardiovascular: Negative for chest pain.  Musculoskeletal: Negative for gait problem.  Hematological: Negative for adenopathy.    HISTORY: Past Medical, Surgical, Social, and Family History Reviewed & Updated per EMR.   Pertinent Historical Findings include:  Past Medical History:  Diagnosis Date  . Arthritis   . Bronchitis    hx of  . CHF (congestive heart failure) ([place])   . Diabetes mellitus   . GERD (gastroesophageal reflux disease)   . Hypercholesteremia   . Hypertension   . Hypothyroidism   . Pneumonia    hx of  . PONV (postoperative nausea and vomiting)   . Thyroid disease     Past Surgical History:  Procedure Laterality Date  . ABDOMINAL HYSTERECTOMY     partial  . BREAST SURGERY     lumpectomy left breast  . carpel tunnel    . COLONOSCOPY W/ POLYPECTOMY     . FOOT SURGERY    . knee arhtroscopy    . SHOULDER ARTHROSCOPY WITH SUBACROMIAL DECOMPRESSION, ROTATOR CUFF REPAIR AND BICEP TENDON REPAIR Right 05/26/2012   Procedure: Right Shoulder Diagnostic Operative Arthroscopy, Subacromial Decompression, Biceps Tenodesis, Mini Open Rotator Cuff Repair;  Surgeon: [name], MD;  Location: [facility];  Service: Orthopedics;  Laterality: Right;  Right Shoulder Diagnostic Operative Arthroscopy, Subacromial Decompression, Biceps Tenodesis, Mini Open Rotator Cuff Repair  . TONSILLECTOMY      Allergies  Allergen Reactions  . Contrast Media [Iodinated Diagnostic Agents]     Throat swells from ct con tra  . Adhesive [Tape]     No paper tape, causes blisters  . Erythromycin     REACTION: rash  . Penicillins     REACTION: rash    Family History  Problem Relation Age of Onset  . Adopted: Yes  . Diabetes Mother   . Heart disease Mother   . Hyperlipidemia Mother   . Coronary artery disease Unknown      Social History   Social History  . Marital status: Married    Spouse name: N/A  . Number of children: N/A  . Years of education: N/A   Occupational History  . Not on file.   Social History Main Topics  . Smoking status: Former Smoker    Quit date: 04/01/1990  . Smokeless tobacco:  Never Used  . Alcohol use No  . Drug use: No  . Sexual activity: No   Other Topics Concern  . Not on file   Social History Narrative  . No narrative on file     PHYSICAL EXAM:  VS: BP 140/78 (BP Location: Right Arm, Patient Position: Sitting, Cuff Size: Large)   Pulse 82   Temp 98.2 F (36.8 C) (Oral)   Ht 5\' 5"  (1.651 m)   Wt 240 lb (108.9 kg)   SpO2 100%   BMI 39.94 kg/m  Physical Exam Gen: NAD, alert, cooperative with exam, well-appearing ENT: normal lips, normal nasal mucosa, tympanic members clear and intact bilaterally, swollen turbinates, oropharynx clear, no tonsillar exudates, no cervical lymphadenopathy Eye: normal EOM, normal  conjunctiva and lids CV:  no edema, +2 pedal pulses, regular rate and rhythm, S1-S2   Resp: no accessory muscle use, non-labored, clear to auscultation bilaterally, no crackles or wheezes Skin: no rashes, no areas of induration  Neuro: normal tone, normal sensation to touch Psych:  normal insight, alert and oriented MSK: Normal gait, normal strength       ASSESSMENT & PLAN:   Viral URI Symptoms seem to be consistent with viral infection. She does not appear to have COPD exacerbation. No suggestion of heart failure exacerbation. - Hycodan for cough - Supportive measures encouraged. - If worsens or fails to improve may need to get a chest x-ray

## 2016-12-03 NOTE — Assessment & Plan Note (Signed)
Symptoms seem to be consistent with viral infection. She does not appear to have COPD exacerbation. No suggestion of heart failure exacerbation. - Hycodan for cough - Supportive measures encouraged. - If worsens or fails to improve may need to get a chest x-ray

## 2017-01-04 ENCOUNTER — Other Ambulatory Visit: Payer: Self-pay | Admitting: Family

## 2017-01-07 ENCOUNTER — Other Ambulatory Visit (INDEPENDENT_AMBULATORY_CARE_PROVIDER_SITE_OTHER): Payer: Medicare Other

## 2017-01-07 DIAGNOSIS — E1165 Type 2 diabetes mellitus with hyperglycemia: Secondary | ICD-10-CM

## 2017-01-07 DIAGNOSIS — Z794 Long term (current) use of insulin: Secondary | ICD-10-CM | POA: Diagnosis not present

## 2017-01-07 LAB — BASIC METABOLIC PANEL
BUN: 13 mg/dL (ref 6–23)
CHLORIDE: 99 meq/L (ref 96–112)
CO2: 29 meq/L (ref 19–32)
Calcium: 9.8 mg/dL (ref 8.4–10.5)
Creatinine, Ser: 0.75 mg/dL (ref 0.40–1.20)
GFR: 98.43 mL/min (ref >60.00)
GLUCOSE: 218 mg/dL — AB (ref 70–99)
POTASSIUM: 3.5 meq/L (ref 3.5–5.1)
SODIUM: 137 meq/L (ref 135–145)

## 2017-01-07 LAB — HEMOGLOBIN A1C: Hgb A1c MFr Bld: 7.3 % — ABNORMAL HIGH (ref 4.6–6.5)

## 2017-01-12 NOTE — Progress Notes (Signed)
Patient ID: Marissa Roberts, female   DOB: [birth year], 71 y.o.   MRN: [id number]               Reason for Appointment:  Follow-up for diabetes and hypertension   History of Present Illness:             Type 2 diabetes mellitus, date of diagnosis: 2005        Recent history:   INSULIN regimen is  Tresiba 54 units am , Novolog 15-20 units bid- tid ac  Oral hypoglycemic drugs the patient is taking are: Metformin 2 g daily  Her blood sugars are   Current blood sugar patterns, management and problems identified:  FASTING blood sugars are Better with an average of 129  She still does not check readings after any of her meals despite reminding her on each visit  She does not always take her insulin before eating when she is going out to eat as she is not able to do an injection in her abdomen and did not know that she can inject in her arm also  She is getting tough areas on her abdomen where she is injecting   She is not always avoiding high-fat foods or fried foods as well as hamburgers when eating out which she is doing freely frequently  Also will sometimes have 1/2 sweet tea  She will occasionally have a low sugar at 2 AM possibly when she takes her insulin when she comes back  She will skip her NOVOLOG in the morning when blood sugars are low normal and her glucose was 218 in the lab after breakfast  Not walking as much lately  Her weight is about the same, however she thinks that she is not as hungry in the morning  Not clear if she is having less hypoglycemia overnight with switching her Toujeo to the morning which was done after the CGM     Side effects from medications have been:none  Compliance with the medical regimen: Fair  Glucose monitoring:  done 1 time a day         Glucometer:  One Touch.      Blood Glucose readings from monitor download show  Mean values apply above for all meters except median for One Touch  PRE-MEAL Fasting Lunch Dinner Bedtime  Overall  Glucose range: 92-177      Mean/median:     127   Self-care:   Meals: 3 meals per day. Breakfast is yogurt or toast, bacon, egg; usually not a lot of carbohydrates, Periodically fried food including fish, french fries.    Not always restricting fat intake or sweet tea  when eating out        Exercise:   Walking, less recently  Dietician visit, most recent:2014.               Weight history:  Wt Readings from Last 3 Encounters:  01/13/17 241 lb 12.8 oz (109.7 kg)  12/03/16 240 lb (108.9 kg)  11/11/16 243 lb (110.2 kg)    Glycemic control:   Lab Results  Component Value Date   HGBA1C 7.3 (H) 01/07/2017   HGBA1C 8.0 (H) 10/08/2016   HGBA1C 7.8 (H) 07/05/2016   Lab Results  Component Value Date   MICROALBUR 89.4 (H) 10/08/2016   LDLCALC 94 07/05/2016   CREATININE 0.75 01/07/2017   No results found for: FRUCTOSAMINE    Past history:  She thinks her blood sugars were only mildly increased at borderline  levels at the onsetper Most likely she was treated with metformin initially and this has been continued.   At some point she was also tried on [place] and [place] for improving her control. She is not sure why she was started on insulin 5 years ago, presumably for worsening hyperglycemia Also not clear if she has tried various insulin regimens before starting Lantus and NovoLog Her A1c has been usually over 7% and gradually increasing over the last year  Previously had tried the V-go pump but subsequently she stopped doing the V-go pump because of the cost, skin irritation and local discomfort   Allergies as of 01/13/2017      Reactions   Contrast Media [iodinated Diagnostic Agents]    Throat swells from ct con tra   Adhesive [tape]    No paper tape, causes blisters   Erythromycin    REACTION: rash   Penicillins    REACTION: rash      Medication List       Accurate as of 01/13/17 10:26 AM. Always use your most recent med list.            amLODipine 10 MG tablet Commonly known as:  NORVASC Take 1 tablet (10 mg total) by mouth daily.   CALCIUM 600 1500 (600 Ca) MG Tabs tablet Generic drug:  calcium carbonate Take 2 tablets by mouth daily.   cyanocobalamin 100 MCG tablet Take 100 mcg by mouth daily.   fluticasone 50 MCG/ACT nasal spray Commonly known as:  FLONASE Place 2 sprays into both nostrils daily.   furosemide 40 MG tablet Commonly known as:  LASIX TAKE 2 TABLETS BY MOUTH EVERY DAY   glucosamine-chondroitin 500-400 MG tablet Take 1 tablet by mouth daily.   glucose blood test strip Commonly known as:  ONETOUCH VERIO Please check blood sugars at least half the time about 2 hours after any meal and 3 times per day on waking up. Dx code E11.9   Insulin Pen Needle 31G X 8 MM Misc Commonly known as:  EASY TOUCH PEN NEEDLES USE TO INJECT LANTUS AND NOVOLOG 4 TIMES PER DAY-DX CODE E11.9   levothyroxine 137 MCG tablet Commonly known as:  SYNTHROID, LEVOTHROID TAKE ONE TABLET BY MOUTH ONCE DAILY   LINZESS 290 MCG Caps capsule Generic drug:  linaclotide Take 1 capsule (290 mcg total) by mouth daily.   magnesium oxide 400 MG tablet Commonly known as:  MAG-OX Take 400 mg by mouth daily.   metFORMIN 1000 MG tablet Commonly known as:  GLUCOPHAGE TAKE 1 TABLET BY MOUTH TWICE DAILY WITH MEALS   montelukast 10 MG tablet Commonly known as:  SINGULAIR Take 1 tablet (10 mg total) by mouth at bedtime.   NOVOLOG FLEXPEN 100 UNIT/ML FlexPen Generic drug:  insulin aspart INJECT UP TO 20 UNITS INTO THE SKIN BEFORE EACH MEAL   ONETOUCH DELICA LANCETS FINE Misc Please check blood sugars at least half the time about 2 hours after any meal and 3 times per week on waking up. Dx code E11.9   potassium chloride 10 MEQ tablet Commonly known as:  K-DUR Take 1 tablet (10 mEq total) by mouth 2 (two) times daily.   PROBIOTIC DAILY PO Take by mouth.   rosuvastatin 40 MG tablet Commonly known as:  CRESTOR Take 1 tablet  (40 mg total) by mouth daily.   traMADol 50 MG tablet Commonly known as:  ULTRAM Take 1 tablet (50 mg total) by mouth every 12 (twelve) hours as needed.   TRESIBA  FLEXTOUCH 200 UNIT/ML Sopn Generic drug:  Insulin Degludec INJECT 52 UNITS INTO THE SKIN EVERY MORNING.   valsartan-hydrochlorothiazide 160-12.5 MG tablet Commonly known as:  DIOVAN HCT Take 1 tablet by mouth daily.       Allergies:  Allergies  Allergen Reactions  . Contrast Media [Iodinated Diagnostic Agents]     Throat swells from ct con tra  . Adhesive [Tape]     No paper tape, causes blisters  . Erythromycin     REACTION: rash  . Penicillins     REACTION: rash    Past Medical History:  Diagnosis Date  . Arthritis   . Bronchitis    hx of  . CHF (congestive heart failure) ([place])   . Diabetes mellitus   . GERD (gastroesophageal reflux disease)   . Hypercholesteremia   . Hypertension   . Hypothyroidism   . Pneumonia    hx of  . PONV (postoperative nausea and vomiting)   . Thyroid disease     Past Surgical History:  Procedure Laterality Date  . ABDOMINAL HYSTERECTOMY     partial  . BREAST SURGERY     lumpectomy left breast  . carpel tunnel    . COLONOSCOPY W/ POLYPECTOMY    . FOOT SURGERY    . knee arhtroscopy    . SHOULDER ARTHROSCOPY WITH SUBACROMIAL DECOMPRESSION, ROTATOR CUFF REPAIR AND BICEP TENDON REPAIR Right 05/26/2012   Procedure: Right Shoulder Diagnostic Operative Arthroscopy, Subacromial Decompression, Biceps Tenodesis, Mini Open Rotator Cuff Repair;  Surgeon: [name], MD;  Location: [facility];  Service: Orthopedics;  Laterality: Right;  Right Shoulder Diagnostic Operative Arthroscopy, Subacromial Decompression, Biceps Tenodesis, Mini Open Rotator Cuff Repair  . TONSILLECTOMY      Family History  Problem Relation Age of Onset  . Adopted: Yes  . Diabetes Mother   . Heart disease Mother   . Hyperlipidemia Mother   . Coronary artery disease Unknown     Social History:   reports that she quit smoking about 26 years ago. She has never used smokeless tobacco. She reports that she does not drink alcohol or use drugs.    Review of Systems   HYPERTENSION: This is now well controlled, patient is on amlodipine 10 mg daily and Diovan HCT 160/12.5   LIPIDS: followed by PCP. She is taking Crestor 40 mg, previously LDL was high with simvastatin 20 mg LDL below 100        Lab Results  Component Value Date   CHOL 170 07/05/2016   HDL 56.60 07/05/2016   LDLCALC 94 07/05/2016   LDLDIRECT 129.5 02/06/2012   TRIG 99.0 07/05/2016   CHOLHDL 3 07/05/2016                  Thyroid:     She has been hypothyroid for Over 40 years and is taking a supplement of 137 g levothyroxine, 6 tablets per week  Last labs:   Lab Results  Component Value Date   TSH 1.13 10/08/2016   TSH 0.25 (L) 07/05/2016   TSH 1.27 09/26/2015   FREET4 1.03 09/26/2015   FREET4 1.03 06/28/2010   FREET4 1.36 11/27/2007       Physical Examination:  BP 132/74   Pulse 84   Ht 5\' 5"  (1.651 m)   Wt 241 lb 12.8 oz (109.7 kg)   SpO2 96%   BMI 40.24 kg/m     ASSESSMENT/PLAN:  DIABETES:  See history of present illness for detailed discussion of current diabetes management,  blood sugar patterns and problems identified  A1c is 7.3 which is better than usual  Most of her hyperglycemia is postprandial since her fasting readings are generally fairly good As discussed above she does need to manage her diet and mealtime insulin much better than before Discussed various aspects of doing her insulin doses and mealtimes, glucose monitoring, diet, timing and adjustment of insulin dose as well as site rotation; she can also do better with exercise    Recommendations:   She needs to improve her diet significantly  Exercise  Take insulin consistently at breakfast  Adjust and time the insulin at lunch and dinner as directed  Glucose monitoring as discussed  Discussed blood sugar  targets at various times  HYPERTENSION: Continue same medication  Thyroid: Needs follow-up labs on the next visit    Patient Instructions  Adjust Novolog dose based on type of meal   Less fried food, sweet tea  Rotate sites  Novolog at least 10 at breakfast  Check blood sugars on waking up  4/7  Also check blood sugars about 2 hours after a meal and do this after different meals by rotation  Recommended blood sugar levels on waking up is 90-130 and about 2 hours after meal is 130-160  Please bring your blood sugar monitor to each visit, thank you          [facility] 01/13/2017, 10:26 AM   Note: This office note was prepared with Dragon voice recognition system technology. Any transcriptional errors that result from this process are unintentional.

## 2017-01-13 ENCOUNTER — Other Ambulatory Visit: Payer: Self-pay

## 2017-01-13 ENCOUNTER — Encounter: Payer: Self-pay | Admitting: Endocrinology

## 2017-01-13 ENCOUNTER — Ambulatory Visit (INDEPENDENT_AMBULATORY_CARE_PROVIDER_SITE_OTHER): Payer: Medicare Other | Admitting: Endocrinology

## 2017-01-13 VITALS — BP 132/74 | HR 84 | Ht 65.0 in | Wt 241.8 lb

## 2017-01-13 DIAGNOSIS — E782 Mixed hyperlipidemia: Secondary | ICD-10-CM

## 2017-01-13 DIAGNOSIS — Z794 Long term (current) use of insulin: Secondary | ICD-10-CM | POA: Diagnosis not present

## 2017-01-13 DIAGNOSIS — E1165 Type 2 diabetes mellitus with hyperglycemia: Secondary | ICD-10-CM | POA: Diagnosis not present

## 2017-01-13 DIAGNOSIS — E063 Autoimmune thyroiditis: Secondary | ICD-10-CM | POA: Diagnosis not present

## 2017-01-13 DIAGNOSIS — Z23 Encounter for immunization: Secondary | ICD-10-CM | POA: Diagnosis not present

## 2017-01-13 MED ORDER — INSULIN PEN NEEDLE 32G X 4 MM MISC: 1 refills | Status: DC

## 2017-01-13 NOTE — Patient Instructions (Addendum)
Adjust Novolog dose based on type of meal   Less fried food, sweet tea  Rotate sites  Novolog at least 10 at breakfast  Check blood sugars on waking up  4/7  Also check blood sugars about 2 hours after a meal and do this after different meals by rotation  Recommended blood sugar levels on waking up is 90-130 and about 2 hours after meal is 130-160  Please bring your blood sugar monitor to each visit, thank you

## 2017-01-19 ENCOUNTER — Other Ambulatory Visit: Payer: Self-pay | Admitting: Family

## 2017-01-21 ENCOUNTER — Telehealth: Payer: Self-pay | Admitting: Endocrinology

## 2017-01-21 NOTE — Telephone Encounter (Signed)
Called patient and let her know that she needs to have Dr. Sharlet Salina refill her Lasix for her since that is the prescribing doctor.

## 2017-01-21 NOTE — Telephone Encounter (Signed)
This should be prescribed by PCP as before, she needs to call them

## 2017-01-21 NOTE — Telephone Encounter (Signed)
Needs prescription called in for Lasiks .  Pharmacy told her Dr.'s office said they would not refill it. She wants to know why. Pharmacy is [facility] [address] If problem please call patient

## 2017-01-21 NOTE — Telephone Encounter (Signed)
Patient is wanting you to refill Lasix. Last filled from Dr. Sharlet Salina. Please advise.

## 2017-01-27 ENCOUNTER — Other Ambulatory Visit: Payer: Self-pay | Admitting: Endocrinology

## 2017-01-29 ENCOUNTER — Other Ambulatory Visit: Payer: Self-pay | Admitting: Internal Medicine

## 2017-01-29 ENCOUNTER — Other Ambulatory Visit: Payer: Self-pay | Admitting: Endocrinology

## 2017-01-29 DIAGNOSIS — E039 Hypothyroidism, unspecified: Secondary | ICD-10-CM

## 2017-01-29 NOTE — Telephone Encounter (Signed)
Please advise 

## 2017-01-29 NOTE — Telephone Encounter (Signed)
She has to check with her insurance and pharmacy, and this is a generic medication

## 2017-01-29 NOTE — Telephone Encounter (Signed)
Called patient and left a voice message to let her know the message from Dr. Dwyane Dee. Hopefully she will be able to find an alternative medication and will call us back and let us know.

## 2017-01-29 NOTE — Telephone Encounter (Signed)
Patient stated the Valsart/hctz is $97.00 is there another alternative, it is to expensive. Please advise

## 2017-02-13 ENCOUNTER — Encounter (INDEPENDENT_AMBULATORY_CARE_PROVIDER_SITE_OTHER): Payer: Self-pay | Admitting: Orthopedic Surgery

## 2017-02-13 ENCOUNTER — Ambulatory Visit (INDEPENDENT_AMBULATORY_CARE_PROVIDER_SITE_OTHER): Payer: Medicare Other | Admitting: Orthopedic Surgery

## 2017-02-13 ENCOUNTER — Ambulatory Visit (INDEPENDENT_AMBULATORY_CARE_PROVIDER_SITE_OTHER): Payer: Self-pay

## 2017-02-13 DIAGNOSIS — M79605 Pain in left leg: Secondary | ICD-10-CM

## 2017-02-13 NOTE — Progress Notes (Signed)
Office Visit Note   Patient: Marissa Roberts           Date of Birth: 01/17/1946           MRN: [id number] Visit Date: 02/13/2017 Requested by: [name], MD [place], [place] [id number] PCP: [name], MD  Subjective: Chief Complaint  Patient presents with  . Left Leg - Pain    HPI: [name] is a 71 year old patient with left leg pain.  She hit it Sunday trying to get something out of her car.  This was 4 days ago.  She is not on blood thinners but she is a diabetic.  She's had no fevers or chills.  Start her to weight-bear.  She denies any bruising and she's not taking any medication for the problem.  She localizes the pain to the proximal aspect of the anterolateral muscular compartment origin.              ROS: All systems reviewed are negative as they relate to the chief complaint within the history of present illness.  Patient denies  fevers or chills.   Assessment & Plan: Visit Diagnoses:  1. Pain in left leg     Plan: Impression is soft tissue contusion left leg with some anterior compartment swelling but no bruising.  Homans testing is negative.  No screen calf tenderness is present.  Ankle dorsiflexion plantar flexion strength is intact.  No other masses lymph adenopathy or skin changes noted in the right leg region or left leg region I expect about 50% improvement over the next 2 weeks if not come back in for repeat evaluation.  Ace wrap applied.  She should take Aleve about 1 twice a day as well as use compression and ice.  Follow-Up Instructions: Return if symptoms worsen or fail to improve.   Orders:  Orders Placed This Encounter  Procedures  . XR Tibia/Fibula Left   No orders of the defined types were placed in this encounter.     Procedures: No procedures performed   Clinical Data: No additional findings.  Objective: Vital Signs: There were no vitals taken for this visit.  Physical Exam:   Constitutional:  Patient appears well-developed HEENT:  Head: Normocephalic Eyes:EOM are normal Neck: Normal range of motion Cardiovascular: Normal rate Pulmonary/chest: Effort normal Neurologic: Patient is alert Skin: Skin is warm Psychiatric: Patient has normal mood and affect    Ortho Exam: Orthopedic exam demonstrates pretty normal gait alignment compartments are soft she has about 1 cm more swelling in the left calf compared to the right ankle dorsi and plantarflexion inversion eversion strength is intact.  Pedal pulses palpable.  Sensation intact on the dorsal and plantar aspect of the foot.  Knee range of motion is full without effusion on the left  Specialty Comments:  No specialty comments available.  Imaging: Xr Tibia/fibula Left  Result Date: 02/13/2017 AP lateral left tib-fib reviewed.  No fracture present.  No soft tissue abnormality.  Normal left tib-fib    PMFS History: Patient Active Problem List   Diagnosis Date Noted  . Viral URI 12/03/2016  . Ear pain 08/12/2016  . Sprain of calcaneofibular ligament of right ankle 06/06/2016  . Routine general medical examination at a health care facility 02/07/2015  . Headache 11/18/2014  . Obesity (BMI 30-39.9) 04/19/2013  . Hyperlipidemia 07/28/2009  . Osteopenia 03/14/2009  . MYALGIA 01/12/2009  . Diabetes mellitus type 2, uncontrolled, with complications ([place]) 28/36/6294  . Hypothyroidism 09/24/2006  .  Essential hypertension 09/24/2006   Past Medical History:  Diagnosis Date  . Arthritis   . Bronchitis    hx of  . CHF (congestive heart failure) ([place])   . Diabetes mellitus   . GERD (gastroesophageal reflux disease)   . Hypercholesteremia   . Hypertension   . Hypothyroidism   . Pneumonia    hx of  . PONV (postoperative nausea and vomiting)   . Thyroid disease     Family History  Adopted: Yes  Problem Relation Age of Onset  . Diabetes Mother   . Heart disease Mother   . Hyperlipidemia Mother   . Coronary artery  disease Unknown     Past Surgical History:  Procedure Laterality Date  . ABDOMINAL HYSTERECTOMY     partial  . BREAST SURGERY     lumpectomy left breast  . carpel tunnel    . COLONOSCOPY W/ POLYPECTOMY    . FOOT SURGERY    . knee arhtroscopy    . SHOULDER ARTHROSCOPY WITH SUBACROMIAL DECOMPRESSION, ROTATOR CUFF REPAIR AND BICEP TENDON REPAIR Right 05/26/2012   Procedure: Right Shoulder Diagnostic Operative Arthroscopy, Subacromial Decompression, Biceps Tenodesis, Mini Open Rotator Cuff Repair;  Surgeon: [name], MD;  Location: [facility];  Service: Orthopedics;  Laterality: Right;  Right Shoulder Diagnostic Operative Arthroscopy, Subacromial Decompression, Biceps Tenodesis, Mini Open Rotator Cuff Repair  . TONSILLECTOMY     Social History   Occupational History  . Not on file  Tobacco Use  . Smoking status: Former Smoker    Last attempt to quit: 04/01/1990    Years since quitting: 26.8  . Smokeless tobacco: Never Used  Substance and Sexual Activity  . Alcohol use: No  . Drug use: No  . Sexual activity: No    Birth control/protection: Abstinence

## 2017-02-25 DIAGNOSIS — H40023 Open angle with borderline findings, high risk, bilateral: Secondary | ICD-10-CM | POA: Diagnosis not present

## 2017-02-25 DIAGNOSIS — H40053 Ocular hypertension, bilateral: Secondary | ICD-10-CM | POA: Diagnosis not present

## 2017-02-25 DIAGNOSIS — Z83511 Family history of glaucoma: Secondary | ICD-10-CM | POA: Diagnosis not present

## 2017-03-06 ENCOUNTER — Ambulatory Visit: Payer: Medicare Other | Admitting: Internal Medicine

## 2017-03-06 ENCOUNTER — Encounter: Payer: Self-pay | Admitting: Internal Medicine

## 2017-03-06 VITALS — BP 136/82 | HR 76 | Temp 98.5°F | Ht 65.0 in | Wt 245.0 lb

## 2017-03-06 DIAGNOSIS — Z0000 Encounter for general adult medical examination without abnormal findings: Secondary | ICD-10-CM | POA: Diagnosis not present

## 2017-03-06 DIAGNOSIS — E1169 Type 2 diabetes mellitus with other specified complication: Secondary | ICD-10-CM | POA: Diagnosis not present

## 2017-03-06 DIAGNOSIS — E785 Hyperlipidemia, unspecified: Secondary | ICD-10-CM

## 2017-03-06 NOTE — Assessment & Plan Note (Signed)
Flu, tetanus, pneumonia up to date. Counseled about shingrix. Colonoscopy and mammogram up to date. Aged out of pap smear. Eye exam up to date. Counseled about weight loss with exercise and diet changes and nutrition referral declined. Given 10 year screening recommendations.

## 2017-03-06 NOTE — Assessment & Plan Note (Signed)
Goal LDL <100 and her crestor was increased to 40 mg daily at last visit. Doing well with change and labs with next endo labs.

## 2017-03-06 NOTE — Patient Instructions (Addendum)
We will not make any changes today.  Health Maintenance, Female Adopting a healthy lifestyle and getting preventive care can go a long way to promote health and wellness. Talk with your health care provider about what schedule of regular examinations is right for you. This is a good chance for you to check in with your provider about disease prevention and staying healthy. In between checkups, there are plenty of things you can do on your own. Experts have done a lot of research about which lifestyle changes and preventive measures are most likely to keep you healthy. Ask your health care provider for more information. Weight and diet Eat a healthy diet  Be sure to include plenty of vegetables, fruits, low-fat dairy products, and lean protein.  Do not eat a lot of foods high in solid fats, added sugars, or salt.  Get regular exercise. This is one of the most important things you can do for your health. ? Most adults should exercise for at least 150 minutes each week. The exercise should increase your heart rate and make you sweat (moderate-intensity exercise). ? Most adults should also do strengthening exercises at least twice a week. This is in addition to the moderate-intensity exercise.  Maintain a healthy weight  Body mass index (BMI) is a measurement that can be used to identify possible weight problems. It estimates body fat based on height and weight. Your health care provider can help determine your BMI and help you achieve or maintain a healthy weight.  For females 69 years of age and older: ? A BMI below 18.5 is considered underweight. ? A BMI of 18.5 to 24.9 is normal. ? A BMI of 25 to 29.9 is considered overweight. ? A BMI of 30 and above is considered obese.  Watch levels of cholesterol and blood lipids  You should start having your blood tested for lipids and cholesterol at 69 years of age, then have this test every 5 years.  You may need to have your cholesterol levels  checked more often if: ? Your lipid or cholesterol levels are high. ? You are older than 69 years of age. ? You are at high risk for heart disease.  Cancer screening Lung Cancer  Lung cancer screening is recommended for adults 69-50 years old who are at high risk for lung cancer because of a history of smoking.  A yearly low-dose CT scan of the lungs is recommended for people who: ? Currently smoke. ? Have quit within the past 15 years. ? Have at least a 30-pack-year history of smoking. A pack year is smoking an average of one pack of cigarettes a day for 1 year.  Yearly screening should continue until it has been 15 years since you quit.  Yearly screening should stop if you develop a health problem that would prevent you from having lung cancer treatment.  Breast Cancer  Practice breast self-awareness. This means understanding how your breasts normally appear and feel.  It also means doing regular breast self-exams. Let your health care provider know about any changes, no matter how small.  If you are in your 20s or 30s, you should have a clinical breast exam (CBE) by a health care provider every 1-3 years as part of a regular health exam.  If you are 65 or older, have a CBE every year. Also consider having a breast X-ray (mammogram) every year.  If you have a family history of breast cancer, talk to your health care provider about genetic  screening.  If you are at high risk for breast cancer, talk to your health care provider about having an MRI and a mammogram every year.  Breast cancer gene (BRCA) assessment is recommended for women who have family members with BRCA-related cancers. BRCA-related cancers include: ? Breast. ? Ovarian. ? Tubal. ? Peritoneal cancers.  Results of the assessment will determine the need for genetic counseling and BRCA1 and BRCA2 testing.  Cervical Cancer Your health care provider may recommend that you be screened regularly for cancer of the  pelvic organs (ovaries, uterus, and vagina). This screening involves a pelvic examination, including checking for microscopic changes to the surface of your cervix (Pap test). You may be encouraged to have this screening done every 3 years, beginning at age 21.  For women ages 30-65, health care providers may recommend pelvic exams and Pap testing every 3 years, or they may recommend the Pap and pelvic exam, combined with testing for human papilloma virus (HPV), every 5 years. Some types of HPV increase your risk of cervical cancer. Testing for HPV may also be done on women of any age with unclear Pap test results.  Other health care providers may not recommend any screening for nonpregnant women who are considered low risk for pelvic cancer and who do not have symptoms. Ask your health care provider if a screening pelvic exam is right for you.  If you have had past treatment for cervical cancer or a condition that could lead to cancer, you need Pap tests and screening for cancer for at least 20 years after your treatment. If Pap tests have been discontinued, your risk factors (such as having a new sexual partner) need to be reassessed to determine if screening should resume. Some women have medical problems that increase the chance of getting cervical cancer. In these cases, your health care provider may recommend more frequent screening and Pap tests.  Colorectal Cancer  This type of cancer can be detected and often prevented.  Routine colorectal cancer screening usually begins at 69 years of age and continues through 69 years of age.  Your health care provider may recommend screening at an earlier age if you have risk factors for colon cancer.  Your health care provider may also recommend using home test kits to check for hidden blood in the stool.  A small camera at the end of a tube can be used to examine your colon directly (sigmoidoscopy or colonoscopy). This is done to check for the  earliest forms of colorectal cancer.  Routine screening usually begins at age 50.  Direct examination of the colon should be repeated every 5-10 years through 69 years of age. However, you may need to be screened more often if early forms of precancerous polyps or small growths are found.  Skin Cancer  Check your skin from head to toe regularly.  Tell your health care provider about any new moles or changes in moles, especially if there is a change in a mole's shape or color.  Also tell your health care provider if you have a mole that is larger than the size of a pencil eraser.  Always use sunscreen. Apply sunscreen liberally and repeatedly throughout the day.  Protect yourself by wearing long sleeves, pants, a wide-brimmed hat, and sunglasses whenever you are outside.  Heart disease, diabetes, and high blood pressure  High blood pressure causes heart disease and increases the risk of stroke. High blood pressure is more likely to develop in: ? People who   have blood pressure in the high end of the normal range (130-139/85-89 mm Hg). ? People who are overweight or obese. ? People who are African American.  If you are 19-29 years of age, have your blood pressure checked every 3-5 years. If you are 30 years of age or older, have your blood pressure checked every year. You should have your blood pressure measured twice-once when you are at a hospital or clinic, and once when you are not at a hospital or clinic. Record the average of the two measurements. To check your blood pressure when you are not at a hospital or clinic, you can use: ? An automated blood pressure machine at a pharmacy. ? A home blood pressure monitor.  If you are between 65 years and 63 years old, ask your health care provider if you should take aspirin to prevent strokes.  Have regular diabetes screenings. This involves taking a blood sample to check your fasting blood sugar level. ? If you are at a normal weight and  have a low risk for diabetes, have this test once every three years after 69 years of age. ? If you are overweight and have a high risk for diabetes, consider being tested at a younger age or more often. Preventing infection Hepatitis B  If you have a higher risk for hepatitis B, you should be screened for this virus. You are considered at high risk for hepatitis B if: ? You were born in a country where hepatitis B is common. Ask your health care provider which countries are considered high risk. ? Your parents were born in a high-risk country, and you have not been immunized against hepatitis B (hepatitis B vaccine). ? You have HIV or AIDS. ? You use needles to inject street drugs. ? You live with someone who has hepatitis B. ? You have had sex with someone who has hepatitis B. ? You get hemodialysis treatment. ? You take certain medicines for conditions, including cancer, organ transplantation, and autoimmune conditions.  Hepatitis C  Blood testing is recommended for: ? Everyone born from [birth year] through 1963. ? Anyone with known risk factors for hepatitis C.  Sexually transmitted infections (STIs)  You should be screened for sexually transmitted infections (STIs) including gonorrhea and chlamydia if: ? You are sexually active and are younger than 69 years of age. ? You are older than 69 years of age and your health care provider tells you that you are at risk for this type of infection. ? Your sexual activity has changed since you were last screened and you are at an increased risk for chlamydia or gonorrhea. Ask your health care provider if you are at risk.  If you do not have HIV, but are at risk, it may be recommended that you take a prescription medicine daily to prevent HIV infection. This is called pre-exposure prophylaxis (PrEP). You are considered at risk if: ? You are sexually active and do not regularly use condoms or know the HIV status of your partner(s). ? You take drugs by  injection. ? You are sexually active with a partner who has HIV.  Talk with your health care provider about whether you are at high risk of being infected with HIV. If you choose to begin PrEP, you should first be tested for HIV. You should then be tested every 3 months for as long as you are taking PrEP. Pregnancy  If you are premenopausal and you may become pregnant, ask your health care provider  about preconception counseling.  If you may become pregnant, take 400 to 800 micrograms (mcg) of folic acid every day.  If you want to prevent pregnancy, talk to your health care provider about birth control (contraception). Osteoporosis and menopause  Osteoporosis is a disease in which the bones lose minerals and strength with aging. This can result in serious bone fractures. Your risk for osteoporosis can be identified using a bone density scan.  If you are 6 years of age or older, or if you are at risk for osteoporosis and fractures, ask your health care provider if you should be screened.  Ask your health care provider whether you should take a calcium or vitamin D supplement to lower your risk for osteoporosis.  Menopause may have certain physical symptoms and risks.  Hormone replacement therapy may reduce some of these symptoms and risks. Talk to your health care provider about whether hormone replacement therapy is right for you. Follow these instructions at home:  Schedule regular health, dental, and eye exams.  Stay current with your immunizations.  Do not use any tobacco products including cigarettes, chewing tobacco, or electronic cigarettes.  If you are pregnant, do not drink alcohol.  If you are breastfeeding, limit how much and how often you drink alcohol.  Limit alcohol intake to no more than 1 drink per day for nonpregnant women. One drink equals 12 ounces of beer, 5 ounces of wine, or 1 ounces of hard liquor.  Do not use street drugs.  Do not share needles.  Ask  your health care provider for help if you need support or information about quitting drugs.  Tell your health care provider if you often feel depressed.  Tell your health care provider if you have ever been abused or do not feel safe at home. This information is not intended to replace advice given to you by your health care provider. Make sure you discuss any questions you have with your health care provider. Document Released: 10/01/2010 Document Revised: 08/24/2015 Document Reviewed: 12/20/2014 Elsevier Interactive Patient Education  [employer].

## 2017-03-06 NOTE — Progress Notes (Signed)
   Subjective:    Patient ID: Marissa Roberts, female    DOB: 1945/06/14, 71 y.o.   MRN: [id number]  HPI The patient is a 71 YO female coming in for physical.   PMH, [facility], social history reviewed and updated.   Review of Systems  Constitutional: Negative.   HENT: Negative.   Eyes: Negative.   Respiratory: Negative for cough, chest tightness and shortness of breath.   Cardiovascular: Negative for chest pain, palpitations and leg swelling.  Gastrointestinal: Negative for abdominal distention, abdominal pain, constipation, diarrhea, nausea and vomiting.  Musculoskeletal: Positive for arthralgias.  Skin: Negative.   Neurological: Negative.   Psychiatric/Behavioral: Negative.       Objective:   Physical Exam  Constitutional: She is oriented to person, place, and time. She appears well-developed and well-nourished.  Overweight  HENT:  Head: Normocephalic and atraumatic.  Eyes: EOM are normal.  Neck: Normal range of motion.  Cardiovascular: Normal rate and regular rhythm.  Pulmonary/Chest: Effort normal and breath sounds normal. No respiratory distress. She has no wheezes. She has no rales.  Abdominal: Soft. Bowel sounds are normal. She exhibits no distension. There is no tenderness. There is no rebound.  Musculoskeletal: She exhibits no edema.  Neurological: She is alert and oriented to person, place, and time. Coordination normal.  Skin: Skin is warm and dry.  Psychiatric: She has a normal mood and affect.   Vitals:   03/06/17 0927  BP: 136/82  Pulse: 76  Temp: 98.5 F (36.9 C)  TempSrc: Oral  SpO2: 99%  Weight: 245 lb (111.1 kg)  Height: 5\' 5"  (1.651 m)      Assessment & Plan:

## 2017-03-06 NOTE — Assessment & Plan Note (Signed)
BMI 40 and concurrent diabetes, OA, hyperlipidemia. Encouraged to work on weight loss.

## 2017-03-31 ENCOUNTER — Other Ambulatory Visit: Payer: Self-pay | Admitting: Endocrinology

## 2017-04-10 DIAGNOSIS — Z01419 Encounter for gynecological examination (general) (routine) without abnormal findings: Secondary | ICD-10-CM | POA: Diagnosis not present

## 2017-04-10 DIAGNOSIS — Z1231 Encounter for screening mammogram for malignant neoplasm of breast: Secondary | ICD-10-CM | POA: Diagnosis not present

## 2017-04-10 DIAGNOSIS — Z6841 Body Mass Index (BMI) 40.0 and over, adult: Secondary | ICD-10-CM | POA: Diagnosis not present

## 2017-04-10 LAB — HM MAMMOGRAPHY

## 2017-04-11 ENCOUNTER — Other Ambulatory Visit (INDEPENDENT_AMBULATORY_CARE_PROVIDER_SITE_OTHER): Payer: Medicare Other

## 2017-04-11 DIAGNOSIS — E1165 Type 2 diabetes mellitus with hyperglycemia: Secondary | ICD-10-CM

## 2017-04-11 DIAGNOSIS — E063 Autoimmune thyroiditis: Secondary | ICD-10-CM

## 2017-04-11 DIAGNOSIS — Z794 Long term (current) use of insulin: Secondary | ICD-10-CM | POA: Diagnosis not present

## 2017-04-11 DIAGNOSIS — E782 Mixed hyperlipidemia: Secondary | ICD-10-CM | POA: Diagnosis not present

## 2017-04-11 LAB — COMPREHENSIVE METABOLIC PANEL
ALBUMIN: 4 g/dL (ref 3.5–5.2)
ALK PHOS: 51 U/L (ref 39–117)
ALT: 15 U/L (ref 0–35)
AST: 13 U/L (ref 0–37)
BUN: 14 mg/dL (ref 6–23)
CALCIUM: 9.4 mg/dL (ref 8.4–10.5)
CO2: 28 mEq/L (ref 19–32)
CREATININE: 0.78 mg/dL (ref 0.40–1.20)
Chloride: 100 mEq/L (ref 96–112)
GFR: 94 mL/min (ref >60.00)
Glucose, Bld: 209 mg/dL — ABNORMAL HIGH (ref 70–99)
Potassium: 3.6 mEq/L (ref 3.5–5.1)
SODIUM: 137 meq/L (ref 135–145)
TOTAL PROTEIN: 7.3 g/dL (ref 6.0–8.3)
Total Bilirubin: 0.2 mg/dL (ref 0.2–1.2)

## 2017-04-11 LAB — LIPID PANEL
Cholesterol: 144 mg/dL (ref 0–200)
HDL: 51.7 mg/dL (ref >39.00)
LDL Cholesterol: 64 mg/dL (ref 0–99)
NonHDL: 92.18
TRIGLYCERIDES: 142 mg/dL (ref 0.0–149.0)
Total CHOL/HDL Ratio: 3
VLDL: 28.4 mg/dL (ref 0.0–40.0)

## 2017-04-11 LAB — HEMOGLOBIN A1C: HEMOGLOBIN A1C: 7.4 % — AB (ref 4.6–6.5)

## 2017-04-11 LAB — TSH: TSH: 1.03 u[IU]/mL (ref 0.35–4.50)

## 2017-04-11 LAB — T4, FREE: FREE T4: 1.13 ng/dL (ref 0.60–1.60)

## 2017-04-15 ENCOUNTER — Ambulatory Visit: Payer: Medicare Other | Admitting: Endocrinology

## 2017-04-15 ENCOUNTER — Encounter: Payer: Self-pay | Admitting: Endocrinology

## 2017-04-15 VITALS — BP 140/70 | HR 89 | Ht 65.0 in | Wt 241.0 lb

## 2017-04-15 DIAGNOSIS — E1165 Type 2 diabetes mellitus with hyperglycemia: Secondary | ICD-10-CM

## 2017-04-15 DIAGNOSIS — Z794 Long term (current) use of insulin: Secondary | ICD-10-CM | POA: Diagnosis not present

## 2017-04-15 NOTE — Patient Instructions (Addendum)
Take 10 NOVOLOG at [place] also  MUST CHECK SUGARS AT 3 PM AND 8-9 PM   INDOOR EXERCISE IDEAS   Use the following examples for a creative indoor workout (perform each move for 2-3 minutes):   Warm up. Put on some music that makes you feel like moving, and dance around the living room.  Watch exercise shows on TV and move along with them. There are tons of free cable channels that have daily exercise shows on them for all levels - beginner through advanced.   You can easily find a number of exercise videos but use one that will suit your liking and exercise level; you can do these on your own schedule.  Walk up and down the steps.  Do dumbbell curls and presses (if you don't have weights, use full water bottles).  Do assisted squats, keeping your back on a fitness ball against the wall or using the back of the couch for support.  Shadow box: Lift and lower the left leg; jab with the right arm, then the left; then lift and lower the right leg.  Fence (you don't even need swords). Pretend you're holding a sword in each hand. Create an X pattern standing still, then moving forward and back.  Hop on your exercise bike or treadmill -- or, for something different, use a weighted hula hoop. If you don't have any of those, just go back to dancing.  Do abdominal crunches (hold a weighted ball for added resistance).  Cool down with [employer] "I Feel Good" -- or whatever tune makes you feel good

## 2017-04-15 NOTE — Progress Notes (Signed)
Patient ID: Marissa Roberts, female   DOB: [birth year], 72 y.o.   MRN: [id number]               Reason for Appointment:  Follow-up for diabetes and hypertension   History of Present Illness:             Type 2 diabetes mellitus, date of diagnosis: 2005        Recent history:   INSULIN regimen is  Tresiba 54 units am , Novolog 20 units bid- tid ac  Oral hypoglycemic drugs the patient is taking are: Metformin 2 g daily  Her  A1c is about the same at 7.4  Current blood sugar patterns, management and problems identified:  FASTING blood sugars are similar to her last visit with an average in the 120s  However she does have occasionally high readings in the morning; rarely may forget her NovoLog at suppertime  She was told to take NovoLog at breakfast on her last visit since she does have some carbohydrate in the morning and have postprandial readings are appearing to be over 200 but she forgot and has not done so  She thinks she is afraid of getting low sugars with taking [place] and NovoLog at the same time  She is trying to take her NovoLog more consistently when she is eating out but will not do so if she has not planned eating out  She thinks it is easier to do her insulin at mealtimes in her arms, she was not aware of this option on the last visit  She thinks she is trying to cut back on sweet drinks and been going out will have 1/2 sweet tea  She has not exercised and has not been motivated to do so  Still not consistently watching calories are higher fat foods     Side effects from medications have been:none  Compliance with the medical regimen: Fair  Glucose monitoring:  done 1 time a day         Glucometer:  One Touch.      Blood Glucose readings from monitor download show  Mean values apply above for all meters except median for One Touch  PRE-MEAL Fasting Lunch Dinner Bedtime Overall  Glucose range: 92-182      Mean/median:     123   Self-care:   Meals:  3 meals per day. Breakfast is yogurt or toast, bacon, egg; usually not a lot of carbohydrates, Periodically fried food including fish, french fries.    Not always restricting fat intake or sweet tea  when eating out        Exercise:  not Walking   Dietician visit, most recent:2014.               Weight history:  Wt Readings from Last 3 Encounters:  04/15/17 241 lb (109.3 kg)  03/06/17 245 lb (111.1 kg)  01/13/17 241 lb 12.8 oz (109.7 kg)    Glycemic control:   Lab Results  Component Value Date   HGBA1C 7.4 (H) 04/11/2017   HGBA1C 7.3 (H) 01/07/2017   HGBA1C 8.0 (H) 10/08/2016   Lab Results  Component Value Date   MICROALBUR 89.4 (H) 10/08/2016   LDLCALC 64 04/11/2017   CREATININE 0.78 04/11/2017   No results found for: FRUCTOSAMINE    Past history:  She thinks her blood sugars were only mildly increased at borderline levels at the onsetper Most likely she was treated with metformin initially and this has been continued.  At some point she was also tried on [place] and [place] for improving her control. She is not sure why she was started on insulin 5 years ago, presumably for worsening hyperglycemia Also not clear if she has tried various insulin regimens before starting Lantus and NovoLog Her A1c has been usually over 7% and gradually increasing over the last year  Previously had tried the V-go pump but subsequently she stopped doing the V-go pump because of the cost, skin irritation and local discomfort   Allergies as of 04/15/2017      Reactions   Contrast Media [iodinated Diagnostic Agents]    Throat swells from ct con tra   Adhesive [tape]    No paper tape, causes blisters   Erythromycin    REACTION: rash   Penicillins    REACTION: rash      Medication List        Accurate as of 04/15/17 10:32 AM. Always use your most recent med list.          amLODipine 10 MG tablet Commonly known as:  NORVASC Take 1 tablet (10 mg total) by mouth daily.     CALCIUM 600 1500 (600 Ca) MG Tabs tablet Generic drug:  calcium carbonate Take 2 tablets by mouth daily.   cyanocobalamin 100 MCG tablet Take 100 mcg by mouth daily.   fluticasone 50 MCG/ACT nasal spray Commonly known as:  FLONASE Place 2 sprays into both nostrils daily.   furosemide 40 MG tablet Commonly known as:  LASIX TAKE 2 TABLETS BY MOUTH EVERY DAY   glucosamine-chondroitin 500-400 MG tablet Take 1 tablet by mouth daily.   glucose blood test strip Commonly known as:  ONETOUCH VERIO Please check blood sugars at least half the time about 2 hours after any meal and 3 times per day on waking up. Dx code E11.9   Insulin Pen Needle 31G X 8 MM Misc Commonly known as:  EASY TOUCH PEN NEEDLES USE TO INJECT LANTUS AND NOVOLOG 4 TIMES PER DAY-DX CODE E11.9   Insulin Pen Needle 32G X 4 MM Misc Commonly known as:  BD PEN NEEDLE NANO U/F Use to inject insulin 4 times daily   levothyroxine 137 MCG tablet Commonly known as:  SYNTHROID, LEVOTHROID TAKE ONE TABLET BY MOUTH ONCE DAILY   levothyroxine 137 MCG tablet Commonly known as:  SYNTHROID, LEVOTHROID TAKE ONE TABLET BY MOUTH ONCE DAILY   LINZESS 290 MCG Caps capsule Generic drug:  linaclotide Take 1 capsule (290 mcg total) by mouth daily.   magnesium oxide 400 MG tablet Commonly known as:  MAG-OX Take 400 mg by mouth daily.   metFORMIN 1000 MG tablet Commonly known as:  GLUCOPHAGE TAKE 1 TABLET BY MOUTH TWICE DAILY WITH MEALS   montelukast 10 MG tablet Commonly known as:  SINGULAIR Take 1 tablet (10 mg total) by mouth at bedtime.   NOVOLOG FLEXPEN 100 UNIT/ML FlexPen Generic drug:  insulin aspart INJECT UP TO 20 UNITS INTO THE SKIN BEFORE EACH MEAL   ONETOUCH DELICA LANCETS 08X Misc USE TO CHECK BLOOD SUGAR UP TO THREE TIMES A DAY 2 HOURS AFTER A MEAL AND UP TO 3 TIMES A WEEK UPON WAKING UP   potassium chloride 10 MEQ tablet Commonly known as:  K-DUR Take 1 tablet (10 mEq total) by mouth 2 (two) times  daily.   PROBIOTIC DAILY PO Take by mouth.   rosuvastatin 40 MG tablet Commonly known as:  CRESTOR Take 1 tablet (40 mg total) by mouth daily.  traMADol 50 MG tablet Commonly known as:  ULTRAM Take 1 tablet (50 mg total) by mouth every 12 (twelve) hours as needed.   TRESIBA FLEXTOUCH 200 UNIT/ML Sopn Generic drug:  Insulin Degludec INJECT 52 UNITS INTO THE SKIN EVERY MORNING.   valsartan-hydrochlorothiazide 160-12.5 MG tablet Commonly known as:  DIOVAN-HCT TAKE 1 TABLET BY MOUTH ONCE DAILY       Allergies:  Allergies  Allergen Reactions  . Contrast Media [Iodinated Diagnostic Agents]     Throat swells from ct con tra  . Adhesive [Tape]     No paper tape, causes blisters  . Erythromycin     REACTION: rash  . Penicillins     REACTION: rash    Past Medical History:  Diagnosis Date  . Arthritis   . Bronchitis    hx of  . CHF (congestive heart failure) ([place])   . Diabetes mellitus   . GERD (gastroesophageal reflux disease)   . Hypercholesteremia   . Hypertension   . Hypothyroidism   . Pneumonia    hx of  . PONV (postoperative nausea and vomiting)   . Thyroid disease     Past Surgical History:  Procedure Laterality Date  . ABDOMINAL HYSTERECTOMY     partial  . BREAST SURGERY     lumpectomy left breast  . carpel tunnel    . COLONOSCOPY W/ POLYPECTOMY    . FOOT SURGERY    . knee arhtroscopy    . SHOULDER ARTHROSCOPY WITH SUBACROMIAL DECOMPRESSION, ROTATOR CUFF REPAIR AND BICEP TENDON REPAIR Right 05/26/2012   Procedure: Right Shoulder Diagnostic Operative Arthroscopy, Subacromial Decompression, Biceps Tenodesis, Mini Open Rotator Cuff Repair;  Surgeon: [name], MD;  Location: [place];  Service: Orthopedics;  Laterality: Right;  Right Shoulder Diagnostic Operative Arthroscopy, Subacromial Decompression, Biceps Tenodesis, Mini Open Rotator Cuff Repair  . TONSILLECTOMY      Family History  Adopted: Yes  Problem Relation Age of Onset  . Diabetes  Mother   . Heart disease Mother   . Hyperlipidemia Mother   . Coronary artery disease Unknown     Social History:  reports that she quit smoking about 27 years ago. she has never used smokeless tobacco. She reports that she does not drink alcohol or use drugs.    Review of Systems   HYPERTENSION: This is now well controlled, patient is on amlodipine 10 mg daily and Diovan HCT 160/12.5 She took her medications late today  BP Readings from Last 3 Encounters:  04/15/17 140/70  03/06/17 136/82  01/13/17 132/74    LIPIDS:  She is taking Crestor 40 mg, previously LDL was high with simvastatin 20 mg LDL below 100        Lab Results  Component Value Date   CHOL 144 04/11/2017   HDL 51.70 04/11/2017   LDLCALC 64 04/11/2017   LDLDIRECT 129.5 02/06/2012   TRIG 142.0 04/11/2017   CHOLHDL 3 04/11/2017                  Thyroid:     She has been hypothyroid for Over 40 years and is taking a supplement of 137 g levothyroxine, 6 tablets per week  Last labs:   Lab Results  Component Value Date   TSH 1.03 04/11/2017   TSH 1.13 10/08/2016   TSH 0.25 (L) 07/05/2016   FREET4 1.13 04/11/2017   FREET4 1.03 09/26/2015   FREET4 1.03 06/28/2010       Physical Examination:  BP 140/70 (Cuff Size: Large)  Pulse 89   Ht 5\' 5"  (1.651 m)   Wt 241 lb (109.3 kg)   SpO2 95%   BMI 40.10 kg/m     ASSESSMENT/PLAN:  DIABETES:  See history of present illness for detailed discussion of current diabetes management, blood sugar patterns and problems identified  A1c is 7.4, stable  She is on basal bolus insulin and metformin  She does not have any monitoring after meals and not clear if she is taking adequate amounts of insulin Also not exercising Despite recommendations she does not take NovoLog to cover her breakfast meal and postprandial reading was again over 200 in the lab   Recommendations:   She needs to start checking blood sugars after lunch or supper and less in the  morning  Continue to improve her diet   Exercise indoors, given information on how to do this  Take 10 units NovoLog insulin consistently at breakfast  Follow-up in 3 months  HYPERTENSION: Continue same regimen as blood pressure is well-controlled  HYPOTHYROIDISM: Adequately controlled    Patient Instructions  Take 10 NOVOLOG at [place] also  MUST CHECK SUGARS AT 3 PM AND 8-9 PM   INDOOR EXERCISE IDEAS   Use the following examples for a creative indoor workout (perform each move for 2-3 minutes):   Warm up. Put on some music that makes you feel like moving, and dance around the living room.  Watch exercise shows on TV and move along with them. There are tons of free cable channels that have daily exercise shows on them for all levels - beginner through advanced.   You can easily find a number of exercise videos but use one that will suit your liking and exercise level; you can do these on your own schedule.  Walk up and down the steps.  Do dumbbell curls and presses (if you don't have weights, use full water bottles).  Do assisted squats, keeping your back on a fitness ball against the wall or using the back of the couch for support.  Shadow box: Lift and lower the left leg; jab with the right arm, then the left; then lift and lower the right leg.  Fence (you don't even need swords). Pretend you're holding a sword in each hand. Create an X pattern standing still, then moving forward and back.  Hop on your exercise bike or treadmill -- or, for something different, use a weighted hula hoop. If you don't have any of those, just go back to dancing.  Do abdominal crunches (hold a weighted ball for added resistance).  Cool down with [employer] "I Feel Good" -- or whatever tune makes you feel good       Elayne Snare 04/15/2017, 10:32 AM   Note: This office note was prepared with Dragon voice recognition system technology. Any transcriptional errors that result from  this process are unintentional.

## 2017-04-22 ENCOUNTER — Other Ambulatory Visit: Payer: Self-pay

## 2017-04-22 MED ORDER — INSULIN ASPART 100 UNIT/ML FLEXPEN: PEN_INJECTOR | SUBCUTANEOUS | 1 refills | Status: DC

## 2017-04-28 ENCOUNTER — Other Ambulatory Visit: Payer: Self-pay | Admitting: Internal Medicine

## 2017-04-30 ENCOUNTER — Other Ambulatory Visit: Payer: Self-pay | Admitting: Endocrinology

## 2017-05-08 ENCOUNTER — Encounter: Payer: Self-pay | Admitting: Podiatry

## 2017-05-08 ENCOUNTER — Ambulatory Visit: Payer: Medicare Other | Admitting: Podiatry

## 2017-05-08 DIAGNOSIS — M79676 Pain in unspecified toe(s): Secondary | ICD-10-CM | POA: Diagnosis not present

## 2017-05-08 DIAGNOSIS — E1165 Type 2 diabetes mellitus with hyperglycemia: Secondary | ICD-10-CM

## 2017-05-08 DIAGNOSIS — L84 Corns and callosities: Secondary | ICD-10-CM

## 2017-05-08 DIAGNOSIS — B351 Tinea unguium: Secondary | ICD-10-CM | POA: Diagnosis not present

## 2017-05-08 DIAGNOSIS — E118 Type 2 diabetes mellitus with unspecified complications: Secondary | ICD-10-CM

## 2017-05-08 DIAGNOSIS — IMO0002 Reserved for concepts with insufficient information to code with codable children: Secondary | ICD-10-CM

## 2017-05-08 NOTE — Progress Notes (Signed)
Subjective:  Patient ID: Marissa Roberts, female    DOB: [birth year],  MRN: [id number] HPI Chief Complaint  Patient presents with  . Toe Pain    5th toe/nail left - thick, brittle, tender nail x months, tries to trim-diabetic exam    72 y.o. female presents with the above complaint.     Past Medical History:  Diagnosis Date  . Arthritis   . Bronchitis    hx of  . CHF (congestive heart failure) ([place])   . Diabetes mellitus   . GERD (gastroesophageal reflux disease)   . Hypercholesteremia   . Hypertension   . Hypothyroidism   . Pneumonia    hx of  . PONV (postoperative nausea and vomiting)   . Thyroid disease    Past Surgical History:  Procedure Laterality Date  . ABDOMINAL HYSTERECTOMY     partial  . BREAST SURGERY     lumpectomy left breast  . carpel tunnel    . COLONOSCOPY W/ POLYPECTOMY    . FOOT SURGERY    . knee arhtroscopy    . SHOULDER ARTHROSCOPY WITH SUBACROMIAL DECOMPRESSION, ROTATOR CUFF REPAIR AND BICEP TENDON REPAIR Right 05/26/2012   Procedure: Right Shoulder Diagnostic Operative Arthroscopy, Subacromial Decompression, Biceps Tenodesis, Mini Open Rotator Cuff Repair;  Surgeon: [name], MD;  Location: [facility];  Service: Orthopedics;  Laterality: Right;  Right Shoulder Diagnostic Operative Arthroscopy, Subacromial Decompression, Biceps Tenodesis, Mini Open Rotator Cuff Repair  . TONSILLECTOMY      Current Outpatient Medications:  .  amLODipine (NORVASC) 10 MG tablet, Take 1 tablet (10 mg total) by mouth daily., Disp: 90 tablet, Rfl: 3 .  Calcium Carbonate (CALCIUM 600) 1500 MG TABS, Take 2 tablets by mouth daily. , Disp: , Rfl:  .  cyanocobalamin 100 MCG tablet, Take 100 mcg by mouth daily., Disp: , Rfl:  .  fluticasone (FLONASE) 50 MCG/ACT nasal spray, Place 2 sprays into both nostrils daily., Disp: 48 g, Rfl: 1 .  furosemide (LASIX) 40 MG tablet, TAKE 2 TABLETS BY MOUTH EVERY DAY, Disp: 180 tablet, Rfl: 0 .  glucosamine-chondroitin 500-400 MG  tablet, Take 1 tablet by mouth daily.  , Disp: , Rfl:  .  glucose blood (ONETOUCH VERIO) test strip, Please check blood sugars at least half the time about 2 hours after any meal and 3 times per day on waking up. Dx code E11.9, Disp: 270 each, Rfl: 2 .  insulin aspart (NOVOLOG FLEXPEN) 100 UNIT/ML FlexPen, INJECT UP TO 20 UNITS INTO THE SKIN BEFORE EACH MEAL, Disp: 15 pen, Rfl: 1 .  Insulin Pen Needle (BD PEN NEEDLE NANO U/F) 32G X 4 MM MISC, Use to inject insulin 4 times daily, Disp: 200 each, Rfl: 1 .  Insulin Pen Needle (EASY TOUCH PEN NEEDLES) 31G X 8 MM MISC, USE TO INJECT LANTUS AND NOVOLOG 4 TIMES PER DAY-DX CODE E11.9, Disp: 400 each, Rfl: 2 .  levothyroxine (SYNTHROID, LEVOTHROID) 137 MCG tablet, TAKE ONE TABLET BY MOUTH ONCE DAILY, Disp: 90 tablet, Rfl: 3 .  levothyroxine (SYNTHROID, LEVOTHROID) 137 MCG tablet, TAKE ONE TABLET BY MOUTH ONCE DAILY (Patient not taking: Reported on 04/15/2017), Disp: 90 tablet, Rfl: 1 .  LINZESS 290 MCG CAPS capsule, Take 1 capsule (290 mcg total) by mouth daily., Disp: 90 capsule, Rfl: 3 .  magnesium oxide (MAG-OX) 400 MG tablet, Take 400 mg by mouth daily., Disp: , Rfl:  .  metFORMIN (GLUCOPHAGE) 1000 MG tablet, TAKE 1 TABLET BY MOUTH TWICE DAILY WITH MEALS, Disp: 180  tablet, Rfl: 0 .  montelukast (SINGULAIR) 10 MG tablet, Take 1 tablet (10 mg total) by mouth at bedtime., Disp: 30 tablet, Rfl: 3 .  ONETOUCH DELICA LANCETS 99M MISC, USE TO CHECK BLOOD SUGAR UP TO THREE TIMES A DAY 2 HOURS AFTER A MEAL AND UP TO 3 TIMES A WEEK UPON WAKING UP, Disp: 300 each, Rfl: 2 .  potassium chloride (K-DUR) 10 MEQ tablet, Take 1 tablet (10 mEq total) by mouth 2 (two) times daily., Disp: 180 tablet, Rfl: 3 .  Probiotic Product (PROBIOTIC DAILY PO), Take by mouth., Disp: , Rfl:  .  rosuvastatin (CRESTOR) 40 MG tablet, Take 1 tablet (40 mg total) by mouth daily., Disp: 90 tablet, Rfl: 3 .  traMADol (ULTRAM) 50 MG tablet, Take 1 tablet (50 mg total) by mouth every 12 (twelve)  hours as needed., Disp: 30 tablet, Rfl: 0 .  TRESIBA FLEXTOUCH 200 UNIT/ML SOPN, INJECT 52 UNITS INTO THE SKIN EVERY MORNING. (Patient taking differently: Inject 54 Units into the skin every morning. ), Disp: 9 pen, Rfl: 1 .  valsartan-hydrochlorothiazide (DIOVAN-HCT) 160-12.5 MG tablet, TAKE 1 TABLET BY MOUTH ONCE DAILY, Disp: 90 tablet, Rfl: 2  Allergies  Allergen Reactions  . Contrast Media [Iodinated Diagnostic Agents]     Throat swells from ct con tra  . Adhesive [Tape]     No paper tape, causes blisters  . Erythromycin     REACTION: rash  . Penicillins     REACTION: rash   Review of Systems  All other systems reviewed and are negative.  Objective:  There were no vitals filed for this visit.  General: Well developed, nourished, in no acute distress, alert and oriented x3   Dermatological: Skin is warm, dry and supple bilateral. Nails x 10 are well maintained; remaining integument appears unremarkable at this time. There are no open sores, no preulcerative lesions, no rash or signs of infection present.  Toenails are thick yellow dystrophic clinically mycotic painful palpation as well as debridement.  Calluses are thick with porokeratotic centers.  These were debrided today without complications.  Vascular: Dorsalis Pedis artery and Posterior Tibial artery pedal pulses are 2/4 bilateral with immedate capillary fill time. Pedal hair growth present. No varicosities and no lower extremity edema present bilateral.   Neruologic: Grossly intact via light touch bilateral. Vibratory intact via tuning fork bilateral. Protective threshold with Semmes Wienstein monofilament intact to all pedal sites bilateral. Patellar and Achilles deep tendon reflexes 2+ bilateral. No Babinski or clonus noted bilateral.   Musculoskeletal: No gross boney pedal deformities bilateral. No pain, crepitus, or limitation noted with foot and ankle range of motion bilateral. Muscular strength 5/5 in all groups tested  bilateral.  Gait: Unassisted, Nonantalgic.    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  Plan: Debrided all reactive hyperkeratosis debrided toenails 1 through 5 bilateral.     Max T. [place], [place]

## 2017-05-21 ENCOUNTER — Telehealth: Payer: Self-pay | Admitting: Internal Medicine

## 2017-05-21 ENCOUNTER — Other Ambulatory Visit: Payer: Self-pay

## 2017-05-21 MED ORDER — FLUTICASONE PROPIONATE 50 MCG/ACT NA SUSP: 2.0000 | Freq: Every day | NASAL | 1 refills | Status: DC

## 2017-05-21 MED ORDER — MONTELUKAST SODIUM 10 MG PO TABS: 10.0000 mg | ORAL_TABLET | Freq: Every day | ORAL | 3 refills | Status: DC

## 2017-05-21 NOTE — Telephone Encounter (Signed)
Copied from [place] [id number]. Topic: Quick Communication - Rx Refill/Question >> May 21, 2017 10:08 AM Synthia Innocent wrote: Medication: fluticasone (FLONASE) 50 MCG/ACT nasal spray and  montelukast (SINGULAIR) 10 MG tablet 90 day supply   Has the patient contacted their pharmacy? Yes.     (Agent: If no, request that the patient contact the pharmacy for the refill.)   Preferred Pharmacy (with phone number or street name): [facility] [address]: Please be advised that RX refills may take up to 3 business days. We ask that you follow-up with your pharmacy.

## 2017-05-21 NOTE — Telephone Encounter (Signed)
Refills sent to pharmacy. 

## 2017-05-21 NOTE — Telephone Encounter (Deleted)
Copied from [place] [id number]. Topic: Quick Communication - Rx Refill/Question >> May 21, 2017 10:08 AM Synthia Innocent wrote: Medication: fluticasone (FLONASE) 50 MCG/ACT nasal spray and  montelukast (SINGULAIR) 10 MG tablet 90 day supply   Has the patient contacted their pharmacy? Yes.     (Agent: If no, request that the patient contact the pharmacy for the refill.)   Preferred Pharmacy (with phone number [facility] street [facility]): [facility] [address]: Please be advised that RX refills may take up to 3 business days. We ask that you follow-up with your pharmacy.

## 2017-05-21 NOTE — Telephone Encounter (Signed)
Flonase refill Last OV: 03/06/17 for annual visit with Dr. Sharlet Salina Last Refill:05/09/15 [facility] Last OV: 03/06/17 Last Refill: 08/12/16 [facility].   Medications not discussed during annual visit.

## 2017-05-25 ENCOUNTER — Ambulatory Visit (HOSPITAL_COMMUNITY)
Admission: EM | Admit: 2017-05-25 | Discharge: 2017-05-25 | Disposition: A | Discharge status: Home / Self Care | Payer: Medicare Other | Attending: Family Medicine | Admitting: Family Medicine

## 2017-05-25 ENCOUNTER — Ambulatory Visit (INDEPENDENT_AMBULATORY_CARE_PROVIDER_SITE_OTHER): Payer: Medicare Other

## 2017-05-25 ENCOUNTER — Other Ambulatory Visit: Payer: Self-pay

## 2017-05-25 ENCOUNTER — Encounter (HOSPITAL_COMMUNITY): Payer: Self-pay | Admitting: Emergency Medicine

## 2017-05-25 DIAGNOSIS — R69 Illness, unspecified: Secondary | ICD-10-CM | POA: Diagnosis not present

## 2017-05-25 DIAGNOSIS — R05 Cough: Secondary | ICD-10-CM | POA: Diagnosis not present

## 2017-05-25 DIAGNOSIS — R079 Chest pain, unspecified: Secondary | ICD-10-CM | POA: Diagnosis not present

## 2017-05-25 DIAGNOSIS — J111 Influenza due to unidentified influenza virus with other respiratory manifestations: Secondary | ICD-10-CM

## 2017-05-25 MED ORDER — HYDROCODONE-HOMATROPINE 5-1.5 MG/5ML PO SYRP: 5.0000 mL | ORAL_SOLUTION | Freq: Four times a day (QID) | ORAL | 0 refills | Status: DC | PRN

## 2017-05-25 MED ORDER — OSELTAMIVIR PHOSPHATE 75 MG PO CAPS: 75.0000 mg | ORAL_CAPSULE | Freq: Two times a day (BID) | ORAL | 0 refills | Status: DC

## 2017-05-25 NOTE — ED Provider Notes (Signed)
[place]   [id number] 05/25/17 Arrival Time: 1732   SUBJECTIVE:  Marissa Roberts is a 72 y.o. female who presents to the urgent care with complaint of chest congestion, coughing, drainage, headache since Friday (2 days).   Notes right chest pain, lower lateral rib area.  No nausea or vomiting.  Past Medical History:  Diagnosis Date  . Arthritis   . Bronchitis    hx of  . CHF (congestive heart failure) ([place])   . Diabetes mellitus   . GERD (gastroesophageal reflux disease)   . Hypercholesteremia   . Hypertension   . Hypothyroidism   . Pneumonia    hx of  . PONV (postoperative nausea and vomiting)   . Thyroid disease    Family History  Adopted: Yes  Problem Relation Age of Onset  . Diabetes Mother   . Heart disease Mother   . Hyperlipidemia Mother   . Coronary artery disease Unknown    Social History   Socioeconomic History  . Marital status: Married    Spouse name: Not on file  . Number of children: Not on file  . Years of education: Not on file  . Highest education level: Not on file  Social Needs  . Financial resource strain: Not on file  . Food insecurity - worry: Not on file  . Food insecurity - inability: Not on file  . Transportation needs - medical: Not on file  . Transportation needs - non-medical: Not on file  Occupational History  . Not on file  Tobacco Use  . Smoking status: Former Smoker    Last attempt to quit: 04/01/1990    Years since quitting: 27.1  . Smokeless tobacco: Never Used  Substance and Sexual Activity  . Alcohol use: No  . Drug use: No  . Sexual activity: No    Birth control/protection: Abstinence  Other Topics Concern  . Not on file  Social History Narrative  . Not on file   No outpatient medications have been marked as taking for the 05/25/17 encounter [facility] Encounter).   Allergies  Allergen Reactions  . Contrast Media [Iodinated Diagnostic Agents]     Throat swells from ct con tra  . Adhesive [Tape]      No paper tape, causes blisters  . Erythromycin     REACTION: rash  . Penicillins     REACTION: rash      ROS: As per HPI, remainder of ROS negative.   OBJECTIVE:   Vitals:   05/25/17 1759  BP: (!) 154/60  Pulse: 89  Resp: 18  Temp: 98.2 F (36.8 C)  SpO2: 100%     General appearance: alert; no distress Eyes: PERRL; EOMI; conjunctiva normal HENT: normocephalic; atraumatic; TMs normal, canal normal, external ears normal without trauma; nasal mucosa normal; oral mucosa normal Neck: supple Lungs: wheezes (faint) on auscultation bilaterally; nontender ribs Heart: regular rate and rhythm Back: no CVA tenderness Extremities: no cyanosis or edema; symmetrical with no gross deformities Skin: warm and dry Neurologic: normal gait; grossly normal Psychological: alert and cooperative; normal mood and affect      Labs:  Results for orders placed or performed in visit on 04/11/17  T4, free  Result Value Ref Range   Free T4 1.13 0.60 - 1.60 ng/dL  TSH  Result Value Ref Range   TSH 1.03 0.35 - 4.50 uIU/mL  Lipid panel  Result Value Ref Range   Cholesterol 144 0 - 200 mg/dL   Triglycerides 142.0 0.0 - 149.0  mg/dL   HDL 51.70 >39.00 mg/dL   VLDL 28.4 0.0 - 40.0 mg/dL   LDL Cholesterol 64 0 - 99 mg/dL   Total CHOL/HDL Ratio 3    NonHDL 92.18   Comprehensive metabolic panel  Result Value Ref Range   Sodium 137 135 - 145 mEq/L   Potassium 3.6 3.5 - 5.1 mEq/L   Chloride 100 96 - 112 mEq/L   CO2 28 19 - 32 mEq/L   Glucose, Bld 209 (H) 70 - 99 mg/dL   BUN 14 6 - 23 mg/dL   Creatinine, Ser 0.78 0.40 - 1.20 mg/dL   Total Bilirubin 0.2 0.2 - 1.2 mg/dL   Alkaline Phosphatase 51 39 - 117 U/L   AST 13 0 - 37 U/L   ALT 15 0 - 35 U/L   Total Protein 7.3 6.0 - 8.3 g/dL   Albumin 4.0 3.5 - 5.2 g/dL   Calcium 9.4 8.4 - 10.5 mg/dL   GFR 94.00 >60.00 mL/min  Hemoglobin A1c  Result Value Ref Range   Hgb A1c MFr Bld 7.4 (H) 4.6 - 6.5 %    Labs Reviewed - No data to  display  No results found.     ASSESSMENT & PLAN:  1. Influenza-like illness     Meds ordered this encounter  Medications  . oseltamivir (TAMIFLU) 75 MG capsule    Sig: Take 1 capsule (75 mg total) by mouth every 12 (twelve) hours.    Dispense:  10 capsule    Refill:  0  . HYDROcodone-homatropine (HYDROMET) 5-1.5 MG/5ML syrup    Sig: Take 5 mLs by mouth every 6 (six) hours as needed for cough.    Dispense:  60 mL    Refill:  0    Reviewed expectations re: course of current medical issues. Questions answered. Outlined signs and symptoms indicating need for more acute intervention. Patient verbalized understanding. After Visit Summary given.    Procedures:      Robyn Haber, MD 05/25/17 [id number]

## 2017-05-25 NOTE — ED Triage Notes (Signed)
Pt c/o chest congestion, coughing, drainage, headache since Friday.

## 2017-05-25 NOTE — Discharge Instructions (Signed)
You have a viral bronchitis caused by the flu.

## 2017-06-02 ENCOUNTER — Other Ambulatory Visit: Payer: Self-pay | Admitting: Endocrinology

## 2017-06-13 ENCOUNTER — Other Ambulatory Visit: Payer: Self-pay | Admitting: Endocrinology

## 2017-06-25 ENCOUNTER — Other Ambulatory Visit: Payer: Self-pay | Admitting: Endocrinology

## 2017-07-11 ENCOUNTER — Other Ambulatory Visit (INDEPENDENT_AMBULATORY_CARE_PROVIDER_SITE_OTHER): Payer: Medicare Other

## 2017-07-11 DIAGNOSIS — Z794 Long term (current) use of insulin: Secondary | ICD-10-CM

## 2017-07-11 DIAGNOSIS — E1165 Type 2 diabetes mellitus with hyperglycemia: Secondary | ICD-10-CM

## 2017-07-11 LAB — BASIC METABOLIC PANEL
BUN: 16 mg/dL (ref 6–23)
CALCIUM: 9.6 mg/dL (ref 8.4–10.5)
CO2: 31 meq/L (ref 19–32)
CREATININE: 0.81 mg/dL (ref 0.40–1.20)
Chloride: 102 mEq/L (ref 96–112)
GFR: 89.93 mL/min (ref >60.00)
Glucose, Bld: 116 mg/dL — ABNORMAL HIGH (ref 70–99)
Potassium: 4.2 mEq/L (ref 3.5–5.1)
SODIUM: 142 meq/L (ref 135–145)

## 2017-07-11 LAB — HEMOGLOBIN A1C: HEMOGLOBIN A1C: 7.1 % — AB (ref 4.6–6.5)

## 2017-07-14 ENCOUNTER — Ambulatory Visit: Payer: Medicare Other | Admitting: Endocrinology

## 2017-07-14 ENCOUNTER — Encounter: Payer: Self-pay | Admitting: Endocrinology

## 2017-07-14 VITALS — BP 142/72 | HR 83 | Ht 65.0 in | Wt 241.4 lb

## 2017-07-14 DIAGNOSIS — E063 Autoimmune thyroiditis: Secondary | ICD-10-CM | POA: Diagnosis not present

## 2017-07-14 DIAGNOSIS — Z794 Long term (current) use of insulin: Secondary | ICD-10-CM

## 2017-07-14 DIAGNOSIS — E1165 Type 2 diabetes mellitus with hyperglycemia: Secondary | ICD-10-CM

## 2017-07-14 MED ORDER — INSULIN ASPART 100 UNIT/ML FLEXPEN: PEN_INJECTOR | SUBCUTANEOUS | 1 refills | Status: DC

## 2017-07-14 NOTE — Patient Instructions (Signed)
More sugars at bedtime    INDOOR EXERCISE IDEAS   Use the following examples for a creative indoor workout (perform each move for 2-3 minutes):   Warm up. Put on some music that makes you feel like moving, and dance around the living room.  Watch exercise shows on TV and move along with them. There are tons of free cable channels that have daily exercise shows on them for all levels - beginner through advanced.   You can easily find a number of exercise videos but use one that will suit your liking and exercise level; you can do these on your own schedule.  Walk up and down the steps.  Do dumbbell curls and presses (if you don't have weights, use full water bottles).  Do assisted squats, keeping your back on a fitness ball against the wall or using the back of the couch for support.  Shadow box: Lift and lower the left leg; jab with the right arm, then the left; then lift and lower the right leg.  Fence (you don't even need swords). Pretend you're holding a sword in each hand. Create an X pattern standing still, then moving forward and back.  Hop on your exercise bike or treadmill -- or, for something different, use a weighted hula hoop. If you don't have any of those, just go back to dancing.  Do abdominal crunches (hold a weighted ball for added resistance).  Cool down with [employer] "I Feel Good" -- or whatever tune makes you feel good

## 2017-07-14 NOTE — Progress Notes (Signed)
Patient ID: Marissa Roberts, female   DOB: 1945/07/02, 72 y.o.   MRN: [id number]               Reason for Appointment:  Follow-up for diabetes and hypertension   History of Present Illness:             Type 2 diabetes mellitus, date of diagnosis: 2005        Recent history:   INSULIN regimen is  Tresiba 54 units am , Novolog 10-20 units bid- tid ac  Oral hypoglycemic drugs the patient is taking are: Metformin 2 g daily  Her  A1c is slightly better at 7.1, previously 7.4   Current blood sugar patterns, management and problems identified:  On her last visit she was not taking her NovoLog consistently especially at breakfast since she did not understand that she needs to take multiple injections in the morning at breakfast  FASTING blood sugars are somewhat variable but overall excellent  As before she has not checked readings after evening meal and she thinks this is mostly because she is going to sleep soon after eating  She is still not motivated to exercise, previously this was discussed and recommended at least doing indoor exercise program instead of going walking or other exercises  She is trying to take NovoLog based on how much she is eating especially at suppertime and may reduce it to 15 at times  Also trying to take it more regularly when eating out  She is a little better with cutting out drinks with sugar also     Side effects from medications have been:none  Compliance with the medical regimen: Fair  Glucose monitoring:  done 1 time a day         Glucometer:  One Touch.      Blood Glucose readings from  Mean values apply above for all meters except median for One Touch  PRE-MEAL Fasting Lunch Dinner Bedtime Overall  Glucose range:  93-159   105    Mean/median: 115       POST-MEAL PC Breakfast PC Lunch PC Dinner  Glucose range:   ?  Mean/median:       Previous readings:  Mean values apply above for all meters except median for One  Touch  PRE-MEAL Fasting Lunch Dinner Bedtime Overall  Glucose range: 92-182      Mean/median:     123   Self-care:   Meals: 3 meals per day. Breakfast is yogurt or toast, bacon, egg; usually not a lot of carbohydrates, Periodically fried food including fish, french fries.    Not always restricting fat intake or sweet tea  when eating out        Exercise:  none  Dietician visit, most recent:2014.               Weight history:  Wt Readings from Last 3 Encounters:  07/14/17 241 lb 6.4 oz (109.5 kg)  04/15/17 241 lb (109.3 kg)  03/06/17 245 lb (111.1 kg)    Glycemic control:   Lab Results  Component Value Date   HGBA1C 7.1 (H) 07/11/2017   HGBA1C 7.4 (H) 04/11/2017   HGBA1C 7.3 (H) 01/07/2017   Lab Results  Component Value Date   MICROALBUR 89.4 (H) 10/08/2016   LDLCALC 64 04/11/2017   CREATININE 0.81 07/11/2017   No results found for: FRUCTOSAMINE    Past history:  She thinks her blood sugars were only mildly increased at borderline levels at the onsetper  Most likely she was treated with metformin initially and this has been continued.   At some point she was also tried on [place] and [place] for improving her control. She is not sure why she was started on insulin 5 years ago, presumably for worsening hyperglycemia Also not clear if she has tried various insulin regimens before starting Lantus and NovoLog Her A1c has been usually over 7% and gradually increasing over the last year  Previously had tried the V-go pump but subsequently she stopped doing the V-go pump because of the cost, skin irritation and local discomfort   Allergies as of 07/14/2017      Reactions   Contrast Media [iodinated Diagnostic Agents]    Throat swells from ct con tra   Adhesive [tape]    No paper tape, causes blisters   Erythromycin    REACTION: rash   Penicillins    REACTION: rash      Medication List        Accurate as of 07/14/17 10:46 AM. Always use your most recent med  list.          amLODipine 10 MG tablet Commonly known as:  NORVASC TAKE 1 TABLET BY MOUTH ONCE DAILY   CALCIUM 600 1500 (600 Ca) MG Tabs tablet Generic drug:  calcium carbonate Take 2 tablets by mouth daily.   cyanocobalamin 100 MCG tablet Take 100 mcg by mouth daily.   fluticasone 50 MCG/ACT nasal spray Commonly known as:  FLONASE Place 2 sprays into both nostrils daily.   furosemide 40 MG tablet Commonly known as:  LASIX TAKE 2 TABLETS BY MOUTH EVERY DAY   glucosamine-chondroitin 500-400 MG tablet Take 1 tablet by mouth daily.   glucose blood test strip Commonly known as:  ONETOUCH VERIO Please check blood sugars at least half the time about 2 hours after any meal and 3 times per day on waking up. Dx code E11.9   HYDROcodone-homatropine 5-1.5 MG/5ML syrup Commonly known as:  HYDROMET Take 5 mLs by mouth every 6 (six) hours as needed for cough.   insulin aspart 100 UNIT/ML FlexPen Commonly known as:  NOVOLOG FLEXPEN INJECT UP TO 20 UNITS INTO THE SKIN BEFORE EACH MEAL   Insulin Pen Needle 31G X 8 MM Misc Commonly known as:  EASY TOUCH PEN NEEDLES USE TO INJECT LANTUS AND NOVOLOG 4 TIMES PER DAY-DX CODE E11.9   Insulin Pen Needle 32G X 4 MM Misc Commonly known as:  BD PEN NEEDLE NANO U/F Use to inject insulin 4 times daily   levothyroxine 137 MCG tablet Commonly known as:  SYNTHROID, LEVOTHROID TAKE ONE TABLET BY MOUTH ONCE DAILY   LINZESS 290 MCG Caps capsule Generic drug:  linaclotide Take 1 capsule (290 mcg total) by mouth daily.   magnesium oxide 400 MG tablet Commonly known as:  MAG-OX Take 400 mg by mouth daily.   metFORMIN 1000 MG tablet Commonly known as:  GLUCOPHAGE TAKE 1 TABLET BY MOUTH TWICE DAILY WITH MEALS   montelukast 10 MG tablet Commonly known as:  SINGULAIR Take 1 tablet (10 mg total) by mouth at bedtime.   ONETOUCH DELICA LANCETS 01U Misc USE TO CHECK BLOOD SUGAR UP TO THREE TIMES A DAY 2 HOURS AFTER A MEAL AND UP TO 3 TIMES A  WEEK UPON WAKING UP   oseltamivir 75 MG capsule Commonly known as:  TAMIFLU Take 1 capsule (75 mg total) by mouth every 12 (twelve) hours.   potassium chloride 10 MEQ tablet Commonly known as:  K-DUR  Take 1 tablet (10 mEq total) by mouth 2 (two) times daily.   PROBIOTIC DAILY PO Take by mouth.   rosuvastatin 40 MG tablet Commonly known as:  CRESTOR Take 1 tablet (40 mg total) by mouth daily.   traMADol 50 MG tablet Commonly known as:  ULTRAM Take 1 tablet (50 mg total) by mouth every 12 (twelve) hours as needed.   TRESIBA FLEXTOUCH 200 UNIT/ML Sopn Generic drug:  Insulin Degludec INJECT 52 UNITS INTO THE SKIN EVERY MORNING.   valsartan-hydrochlorothiazide 160-12.5 MG tablet Commonly known as:  DIOVAN-HCT TAKE 1 TABLET BY MOUTH ONCE DAILY       Allergies:  Allergies  Allergen Reactions  . Contrast Media [Iodinated Diagnostic Agents]     Throat swells from ct con tra  . Adhesive [Tape]     No paper tape, causes blisters  . Erythromycin     REACTION: rash  . Penicillins     REACTION: rash    Past Medical History:  Diagnosis Date  . Arthritis   . Bronchitis    hx of  . CHF (congestive heart failure) ([place])   . Diabetes mellitus   . GERD (gastroesophageal reflux disease)   . Hypercholesteremia   . Hypertension   . Hypothyroidism   . Pneumonia    hx of  . PONV (postoperative nausea and vomiting)   . Thyroid disease     Past Surgical History:  Procedure Laterality Date  . ABDOMINAL HYSTERECTOMY     partial  . BREAST SURGERY     lumpectomy left breast  . carpel tunnel    . COLONOSCOPY W/ POLYPECTOMY    . FOOT SURGERY    . knee arhtroscopy    . SHOULDER ARTHROSCOPY WITH SUBACROMIAL DECOMPRESSION, ROTATOR CUFF REPAIR AND BICEP TENDON REPAIR Right 05/26/2012   Procedure: Right Shoulder Diagnostic Operative Arthroscopy, Subacromial Decompression, Biceps Tenodesis, Mini Open Rotator Cuff Repair;  Surgeon: [name], MD;  Location: [facility];  Service:  Orthopedics;  Laterality: Right;  Right Shoulder Diagnostic Operative Arthroscopy, Subacromial Decompression, Biceps Tenodesis, Mini Open Rotator Cuff Repair  . TONSILLECTOMY      Family History  Adopted: Yes  Problem Relation Age of Onset  . Diabetes Mother   . Heart disease Mother   . Hyperlipidemia Mother   . Coronary artery disease Unknown     Social History:  reports that she quit smoking about 27 years ago. She has never used smokeless tobacco. She reports that she does not drink alcohol or use drugs.    Review of Systems   HYPERTENSION: This is now well controlled, patient is on amlodipine 10 mg daily and Diovan HCT 160/12.5  On Lasix  BP Readings from Last 3 Encounters:  07/14/17 (!) 142/72  05/25/17 (!) 154/60  04/15/17 140/70    LIPIDS:  She is taking Crestor 40 mg, previously LDL was high with simvastatin 20 mg LDL below 100        Lab Results  Component Value Date   CHOL 144 04/11/2017   HDL 51.70 04/11/2017   LDLCALC 64 04/11/2017   LDLDIRECT 129.5 02/06/2012   TRIG 142.0 04/11/2017   CHOLHDL 3 04/11/2017                  Thyroid:     She has been hypothyroid for Over 40 years and is taking a supplement of 137 g levothyroxine, 6 tablets per week  Last labs:   Lab Results  Component Value Date   TSH 1.03  04/11/2017   TSH 1.13 10/08/2016   TSH 0.25 (L) 07/05/2016   FREET4 1.13 04/11/2017   FREET4 1.03 09/26/2015   FREET4 1.03 06/28/2010       Physical Examination:  BP (!) 142/72 (BP Location: Left Arm, Patient Position: Sitting, Cuff Size: Large)   Pulse 83   Ht 5\' 5"  (1.651 m)   Wt 241 lb 6.4 oz (109.5 kg)   SpO2 97%   BMI 40.17 kg/m     ASSESSMENT/PLAN:  DIABETES on insulin:  See history of present illness for detailed discussion of current diabetes management, blood sugar patterns and problems identified  A1c is slightly better at 7.1, previously was 7.4  She is on basal bolus insulin and metformin Control is better with  taking NovoLog more consistently before meals as directed Weight has leveled off but she can do better with exercise for weight loss   Recommendations:   She was again reminded about checking sugars at night after supper even if it is 1 hour later to help adjust her insulin and also know what her postprandial readings are  She does not have to check every morning  She will also need to do some readings after lunch  Exercise program, she can do this indoors, again given information on how to do this  Continue to improve compliance with insulin at meals  Need to be consistent with her diet and avoid regular soft drinks  HYPERTENSION: Continue same regimen as blood pressure is well-controlled  HYPOTHYROIDISM: Needs follow-up on the next visit      Patient Instructions  More sugars at bedtime    INDOOR EXERCISE IDEAS   Use the following examples for a creative indoor workout (perform each move for 2-3 minutes):   Warm up. Put on some music that makes you feel like moving, and dance around the living room.  Watch exercise shows on TV and move along with them. There are tons of free cable channels that have daily exercise shows on them for all levels - beginner through advanced.   You can easily find a number of exercise videos but use one that will suit your liking and exercise level; you can do these on your own schedule.  Walk up and down the steps.  Do dumbbell curls and presses (if you don't have weights, use full water bottles).  Do assisted squats, keeping your back on a fitness ball against the wall or using the back of the couch for support.  Shadow box: Lift and lower the left leg; jab with the right arm, then the left; then lift and lower the right leg.  Fence (you don't even need swords). Pretend you're holding a sword in each hand. Create an X pattern standing still, then moving forward and back.  Hop on your exercise bike or treadmill -- or, for something  different, use a weighted hula hoop. If you don't have any of those, just go back to dancing.  Do abdominal crunches (hold a weighted ball for added resistance).  Cool down with [employer] "I Feel Good" -- or whatever tune makes you feel good     Elayne Snare 07/14/2017, 10:46 AM   Note: This office note was prepared with Dragon voice recognition system technology. Any transcriptional errors that result from this process are unintentional.

## 2017-07-15 DIAGNOSIS — H40023 Open angle with borderline findings, high risk, bilateral: Secondary | ICD-10-CM | POA: Diagnosis not present

## 2017-07-20 ENCOUNTER — Other Ambulatory Visit: Payer: Self-pay | Admitting: Internal Medicine

## 2017-07-23 ENCOUNTER — Other Ambulatory Visit: Payer: Self-pay | Admitting: Endocrinology

## 2017-07-30 ENCOUNTER — Other Ambulatory Visit: Payer: Self-pay | Admitting: Endocrinology

## 2017-08-08 ENCOUNTER — Ambulatory Visit: Payer: Medicare Other | Admitting: Podiatry

## 2017-08-08 ENCOUNTER — Encounter: Payer: Self-pay | Admitting: Podiatry

## 2017-08-08 DIAGNOSIS — E118 Type 2 diabetes mellitus with unspecified complications: Secondary | ICD-10-CM

## 2017-08-08 DIAGNOSIS — B351 Tinea unguium: Secondary | ICD-10-CM | POA: Diagnosis not present

## 2017-08-08 DIAGNOSIS — M79676 Pain in unspecified toe(s): Secondary | ICD-10-CM | POA: Diagnosis not present

## 2017-08-08 DIAGNOSIS — IMO0002 Reserved for concepts with insufficient information to code with codable children: Secondary | ICD-10-CM

## 2017-08-08 DIAGNOSIS — E1165 Type 2 diabetes mellitus with hyperglycemia: Secondary | ICD-10-CM

## 2017-08-08 NOTE — Progress Notes (Signed)
Complaint:  Visit Type: Patient returns to my office for continued preventative foot care services. Complaint: Patient states" my nails have grown long and thick and become painful to walk and wear shoes" Patient has been diagnosed with DM with no foot complications. The patient presents for preventative foot care services. No changes to ROS  Podiatric Exam: Vascular: dorsalis pedis and posterior tibial pulses are palpable bilateral. Capillary return is immediate. Temperature gradient is WNL. Skin turgor WNL  Sensorium: Normal Semmes Weinstein monofilament test. Normal tactile sensation bilaterally. Nail Exam: Pt has thick disfigured discolored nails with subungual debris noted bilateral entire nail hallux through fifth toenails except left hallux toenail. Ulcer Exam: There is no evidence of ulcer or pre-ulcerative changes or infection. Orthopedic Exam: Muscle tone and strength are WNL. No limitations in general ROM. No crepitus or effusions noted. Foot type and digits show no abnormalities. Bony prominences are unremarkable. Skin: No Porokeratosis. No infection or ulcers.  Asymptomatic HD  B/L. Diagnosis:  Onychomycosis, , Pain in right toe, pain in left toes  Treatment & Plan Procedures and Treatment: Consent by patient was obtained for treatment procedures.   Debridement of mycotic and hypertrophic toenails, 1 through 5 bilateral and clearing of subungual debris. No ulceration, no infection noted.  Return Visit-Office Procedure: Patient instructed to return to the office for a follow up visit 3 months for continued evaluation and treatment.    Gardiner Barefoot DPM

## 2017-08-11 ENCOUNTER — Other Ambulatory Visit: Payer: Self-pay | Admitting: Internal Medicine

## 2017-09-04 ENCOUNTER — Ambulatory Visit: Payer: Medicare Other | Admitting: Internal Medicine

## 2017-09-16 ENCOUNTER — Ambulatory Visit: Payer: Medicare Other | Admitting: Internal Medicine

## 2017-09-16 ENCOUNTER — Ambulatory Visit (INDEPENDENT_AMBULATORY_CARE_PROVIDER_SITE_OTHER): Payer: Medicare Other | Admitting: *Deleted

## 2017-09-16 ENCOUNTER — Other Ambulatory Visit (INDEPENDENT_AMBULATORY_CARE_PROVIDER_SITE_OTHER): Payer: Medicare Other

## 2017-09-16 ENCOUNTER — Encounter: Payer: Self-pay | Admitting: Internal Medicine

## 2017-09-16 VITALS — BP 130/72 | HR 73 | Resp 18 | Ht 65.0 in | Wt 245.0 lb

## 2017-09-16 VITALS — BP 130/72 | HR 73 | Temp 98.1°F | Ht 65.0 in | Wt 245.0 lb

## 2017-09-16 DIAGNOSIS — E118 Type 2 diabetes mellitus with unspecified complications: Secondary | ICD-10-CM

## 2017-09-16 DIAGNOSIS — IMO0002 Reserved for concepts with insufficient information to code with codable children: Secondary | ICD-10-CM

## 2017-09-16 DIAGNOSIS — E1165 Type 2 diabetes mellitus with hyperglycemia: Secondary | ICD-10-CM

## 2017-09-16 DIAGNOSIS — M791 Myalgia, unspecified site: Secondary | ICD-10-CM | POA: Diagnosis not present

## 2017-09-16 DIAGNOSIS — Z0000 Encounter for general adult medical examination without abnormal findings: Secondary | ICD-10-CM

## 2017-09-16 LAB — MAGNESIUM: Magnesium: 2 mg/dL (ref 1.5–2.5)

## 2017-09-16 NOTE — Progress Notes (Signed)
Subjective:   Marissa Roberts is a 72 y.o. female who presents for Medicare Annual (Subsequent) preventive examination.  Review of Systems:  No ROS.  Medicare Wellness Visit. Additional risk factors are reflected in the social history.  Cardiac Risk Factors include: dyslipidemia;diabetes mellitus;hypertension;advanced age (>16men, >48 women);obesity (BMI >30kg/m2) Sleep patterns: gets up 1 times nightly to void and sleeps 7 hours nightly.    Home Safety/Smoke Alarms: Feels safe in home. Smoke alarms in place.  Living environment; residence and Firearm Safety: 1-story house/ trailer, no firearms. Lives with husband, no needs for DME, good support system Seat Belt Safety/Bike Helmet: Wears seat belt.     Objective:     Vitals: BP 130/72   Pulse 73   Resp 18   Ht 5\' 5"  (1.651 m)   Wt 245 lb (111.1 kg)   SpO2 96%   BMI 40.77 kg/m   Body mass index is 40.77 kg/m.  Advanced Directives 09/16/2017 09/12/2016 05/19/2012  Does Patient Have a Medical Advance Directive? Yes Yes Patient does not have advance directive;Patient would not like information  Type of Scientist, forensic Power of Powers;Living will [place];Living will -  Copy of [place] in Chart? - No - copy requested -    Tobacco Social History   Tobacco Use  Smoking Status Former Smoker  . Last attempt to quit: 04/01/1990  . Years since quitting: 27.4  Smokeless Tobacco Never Used     Counseling given: Not Answered  Past Medical History:  Diagnosis Date  . Arthritis   . Bronchitis    hx of  . CHF (congestive heart failure) ([place])   . Diabetes mellitus   . GERD (gastroesophageal reflux disease)   . Hypercholesteremia   . Hypertension   . Hypothyroidism   . Pneumonia    hx of  . PONV (postoperative nausea and vomiting)   . Thyroid disease    Past Surgical History:  Procedure Laterality Date  . ABDOMINAL HYSTERECTOMY     partial  . BREAST SURGERY     lumpectomy left breast  . carpel tunnel    . COLONOSCOPY W/ POLYPECTOMY    . FOOT SURGERY    . knee arhtroscopy    . SHOULDER ARTHROSCOPY WITH SUBACROMIAL DECOMPRESSION, ROTATOR CUFF REPAIR AND BICEP TENDON REPAIR Right 05/26/2012   Procedure: Right Shoulder Diagnostic Operative Arthroscopy, Subacromial Decompression, Biceps Tenodesis, Mini Open Rotator Cuff Repair;  Surgeon: [name], MD;  Location: [facility];  Service: Orthopedics;  Laterality: Right;  Right Shoulder Diagnostic Operative Arthroscopy, Subacromial Decompression, Biceps Tenodesis, Mini Open Rotator Cuff Repair  . TONSILLECTOMY     Family History  Adopted: Yes  Problem Relation Age of Onset  . Diabetes Mother   . Heart disease Mother   . Hyperlipidemia Mother   . Coronary artery disease Unknown    Social History   Socioeconomic History  . Marital status: Married    Spouse name: Not on file  . Number of children: Not on file  . Years of education: Not on file  . Highest education level: Not on file  Occupational History  . Not on file  Social Needs  . Financial resource strain: Not hard at all  . Food insecurity:    Worry: Never true    Inability: Never true  . Transportation needs:    Medical: No    Non-medical: No  Tobacco Use  . Smoking status: Former Smoker    Last attempt to  quit: 04/01/1990    Years since quitting: 27.4  . Smokeless tobacco: Never Used  Substance and Sexual Activity  . Alcohol use: No  . Drug use: No  . Sexual activity: Never    Birth control/protection: Abstinence  Lifestyle  . Physical activity:    Days per week: 0 days    Minutes per session: 0 min  . Stress: Only a little  Relationships  . Social connections:    Talks on phone: More than three times a week    Gets together: More than three times a week    Attends religious service: More than 4 times per year    Active member of club or organization: Yes    Attends meetings of clubs or organizations: More than 4 times  per year    Relationship status: Married  Other Topics Concern  . Not on file  Social History Narrative  . Not on file    Outpatient Encounter Medications as of 09/16/2017  Medication Sig  . amLODipine (NORVASC) 10 MG tablet TAKE 1 TABLET BY MOUTH ONCE DAILY  . Calcium Carbonate (CALCIUM 600) 1500 MG TABS Take 2 tablets by mouth daily.   . cyanocobalamin 100 MCG tablet Take 100 mcg by mouth daily.  . fluticasone (FLONASE) 50 MCG/ACT nasal spray Place 2 sprays into both nostrils daily.  . furosemide (LASIX) 40 MG tablet TAKE 2 TABLETS BY MOUTH EVERY DAY  . glucosamine-chondroitin 500-400 MG tablet Take 1 tablet by mouth daily.    Marland Kitchen glucose blood (ONETOUCH VERIO) test strip Please check blood sugars at least half the time about 2 hours after any meal and 3 times per day on waking up. Dx code E11.9  . HYDROcodone-homatropine (HYDROMET) 5-1.5 MG/5ML syrup Take 5 mLs by mouth every 6 (six) hours as needed for cough.  . insulin aspart (NOVOLOG FLEXPEN) 100 UNIT/ML FlexPen INJECT UP TO 20 UNITS INTO THE SKIN BEFORE EACH MEAL  . Insulin Pen Needle (BD PEN NEEDLE NANO U/F) 32G X 4 MM MISC USE TO INJECT INSULIN 4 TIMES DAILY  . Insulin Pen Needle (EASY TOUCH PEN NEEDLES) 31G X 8 MM MISC USE TO INJECT LANTUS AND NOVOLOG 4 TIMES PER DAY-DX CODE E11.9  . levothyroxine (SYNTHROID, LEVOTHROID) 137 MCG tablet TAKE ONE TABLET BY MOUTH ONCE DAILY  . LINZESS 290 MCG CAPS capsule Take 1 capsule (290 mcg total) by mouth daily. (Patient taking differently: Take 290 mcg by mouth daily. )  . magnesium oxide (MAG-OX) 400 MG tablet Take 400 mg by mouth daily.  . metFORMIN (GLUCOPHAGE) 1000 MG tablet TAKE 1 TABLET BY MOUTH TWICE DAILY WITH MEALS  . montelukast (SINGULAIR) 10 MG tablet TAKE 1 TABLET BY MOUTH EVERYDAY AT BEDTIME  . ONETOUCH DELICA LANCETS 80D MISC USE TO CHECK BLOOD SUGAR UP TO THREE TIMES A DAY 2 HOURS AFTER A MEAL AND UP TO 3 TIMES A WEEK UPON WAKING UP  . oseltamivir (TAMIFLU) 75 MG capsule Take 1  capsule (75 mg total) by mouth every 12 (twelve) hours.  . potassium chloride (K-DUR) 10 MEQ tablet Take 1 tablet (10 mEq total) by mouth 2 (two) times daily.  . Probiotic Product (PROBIOTIC DAILY PO) Take by mouth.  . rosuvastatin (CRESTOR) 40 MG tablet Take 1 tablet (40 mg total) by mouth daily.  . traMADol (ULTRAM) 50 MG tablet Take 1 tablet (50 mg total) by mouth every 12 (twelve) hours as needed.  . TRESIBA FLEXTOUCH 200 UNIT/ML SOPN INJECT 52 UNITS INTO THE SKIN EVERY MORNING.  Marland Kitchen  valsartan-hydrochlorothiazide (DIOVAN-HCT) 160-12.5 MG tablet TAKE 1 TABLET BY MOUTH ONCE DAILY  . [DISCONTINUED] Saxagliptin-Metformin 07-998 MG TB24 Take 1 tablet by mouth daily.   No facility-administered encounter medications on file as of 09/16/2017.     Activities of Daily Living In your present state of health, do you have any difficulty performing the following activities: 09/16/2017  Hearing? N  Vision? N  Difficulty concentrating or making decisions? N  Walking or climbing stairs? N  Dressing or bathing? N  Doing errands, shopping? N  Preparing Food and eating ? N  Using the Toilet? N  In the past six months, have you accidently leaked urine? N  Do you have problems with loss of bowel control? N  Managing your Medications? N  Managing your Finances? N  Housekeeping or managing your Housekeeping? N  Some recent data might be hidden    Patient Care Team: [name], MD as PCP - General (Internal Medicine) Elayne Snare, MD as Consulting Physician (Endocrinology)    Assessment:   This is a routine wellness examination for [name]. Physical assessment deferred to PCP.   Exercise Activities and Dietary recommendations Current Exercise Habits: The patient does not participate in regular exercise at present  Diet (meal preparation, eat out, water intake, caffeinated beverages, dairy products, fruits and vegetables): in general, a "healthy" diet     Reviewed heart healthy and  diabetic diet. Encouraged patient to increase daily water and fluid intake. Relevant patient education assigned to patient using Emmi.  Goals    . Patient Stated     Stay as healthy and as independent as possible. Enjoy life, family and grandchildren.       Fall Risk Fall Risk  09/16/2017 09/16/2017 09/12/2016 09/12/2016 02/07/2015  Falls in the past year? No No No No No   Depression Screen PHQ 2/9 Scores 09/16/2017 09/16/2017 09/12/2016 09/12/2016  PHQ - 2 Score 0 0 0 0  PHQ- 9 Score 2 - - -     Cognitive Function       Ad8 score reviewed for issues:  Issues making decisions: no  Less interest in hobbies / activities: no  Repeats questions, stories (family complaining): no  Trouble using ordinary gadgets (microwave, computer, phone):no  Forgets the month or year: no  Mismanaging finances: no  Remembering appts: no  Daily problems with thinking and/or memory: no Ad8 score is= 0  Immunization History  Administered Date(s) Administered  . Influenza Split 01/03/2011  . Influenza Whole 04/01/2005, 12/18/2009  . Influenza, High Dose Seasonal PF 01/07/2014, 01/27/2015, 01/02/2016, 01/13/2017  . Influenza-Unspecified 11/30/2012  . Pneumococcal Conjugate-13 09/12/2016  . Pneumococcal Polysaccharide-23 07/15/2014  . Td 04/01/2001, 10/07/2013   Screening Tests Health Maintenance  Topic Date Due  . Hepatitis C Screening  07-13-1945  . MAMMOGRAM  04/01/2016  . OPHTHALMOLOGY EXAM  10/25/2017  . INFLUENZA VACCINE  10/30/2017  . HEMOGLOBIN A1C  01/10/2018  . FOOT EXAM  09/17/2018  . COLONOSCOPY  06/20/2020  . TETANUS/TDAP  10/08/2023  . PNA vac Low Risk Adult  Completed  . DEXA SCAN  Addressed      Plan:     Continue doing brain stimulating activities (puzzles, reading, adult coloring books, staying active) to keep memory sharp.   Continue to eat heart healthy diet (full of fruits, vegetables, whole grains, lean protein, water--limit salt, fat, and sugar intake) and  increase physical activity as tolerated.  I have personally reviewed and noted the following in the patient's chart:   .  Medical and social history . Use of alcohol, tobacco or illicit drugs  . Current medications and supplements . Functional ability and status . Nutritional status . Physical activity . Advanced directives . List of other physicians . Vitals . Screenings to include cognitive, depression, and falls . Referrals and appointments  In addition, I have reviewed and discussed with patient certain preventive protocols, quality metrics, and best practice recommendations. A written personalized care plan for preventive services as well as general preventive health recommendations were provided to patient.     Michiel Cowboy, RN  09/16/2017

## 2017-09-16 NOTE — Assessment & Plan Note (Signed)
Recent CMP normal, checking magnesium level today and adjust as needed. No pain on palpation today making myositis unlikely. Is on crestor which is also not likely the cause.

## 2017-09-16 NOTE — Progress Notes (Signed)
   Subjective:    Patient ID: Marissa Roberts, female    DOB: [birth year], 72 y.o.   MRN: [id number]  HPI The patient is a 72 YO female coming in for some muscle aches in her right thigh. This has been intermittent and has a history of shingles in that area. She denies trying anything for it. Takes potassium everyday and thinks that helps some. Denies change in diet or hydration. Denies new exercise routine.   Review of Systems  Constitutional: Negative.   Respiratory: Negative for cough, chest tightness and shortness of breath.   Cardiovascular: Negative for chest pain, palpitations and leg swelling.  Gastrointestinal: Negative for abdominal distention, abdominal pain, constipation, diarrhea, nausea and vomiting.  Musculoskeletal: Positive for myalgias.  Skin: Negative.   Neurological: Negative.   Psychiatric/Behavioral: Negative.       Objective:   Physical Exam  Constitutional: She is oriented to person, place, and time. She appears well-developed and well-nourished.  HENT:  Head: Normocephalic and atraumatic.  Eyes: EOM are normal.  Neck: Normal range of motion.  Cardiovascular: Normal rate and regular rhythm.  Pulmonary/Chest: Effort normal and breath sounds normal. No respiratory distress. She has no wheezes. She has no rales.  Abdominal: Soft. She exhibits no distension. There is no tenderness. There is no rebound.  Musculoskeletal: She exhibits no edema.  Neurological: She is alert and oriented to person, place, and time. Coordination normal.  Skin: Skin is warm and dry.   Vitals:   09/16/17 1017  BP: 130/72  Pulse: 73  Temp: 98.1 F (36.7 C)  TempSrc: Oral  SpO2: 96%  Weight: 245 lb (111.1 kg)  Height: 5\' 5"  (1.651 m)      Assessment & Plan:

## 2017-09-16 NOTE — Progress Notes (Signed)
Medical screening examination/treatment/procedure(s) were performed by non-physician practitioner and as supervising physician I was immediately available for consultation/collaboration. I agree with above. Damere Brandenburg A Joaovictor Krone, MD 

## 2017-09-16 NOTE — Assessment & Plan Note (Signed)
Foot exam done .   

## 2017-09-16 NOTE — Patient Instructions (Signed)
Continue doing brain stimulating activities (puzzles, reading, adult coloring books, staying active) to keep memory sharp.   Continue to eat heart healthy diet (full of fruits, vegetables, whole grains, lean protein, water--limit salt, fat, and sugar intake) and increase physical activity as tolerated.   Marissa Roberts , Thank you for taking time to come for your Medicare Wellness Visit. I appreciate your ongoing commitment to your health goals. Please review the following plan we discussed and let me know if I can assist you in the future.   These are the goals we discussed: Goals    . Patient Stated     Stay as healthy and as independent as possible. Enjoy life, family and grandchildren.       This is a list of the screening recommended for you and due dates:  Health Maintenance  Topic Date Due  .  Hepatitis C: One time screening is recommended by Center for Disease Control  (CDC) for  adults born from [birth year] through 1963.   1946-03-15  . Mammogram  04/01/2016  . Eye exam for diabetics  10/25/2017  . Flu Shot  10/30/2017  . Hemoglobin A1C  01/10/2018  . Complete foot exam   09/17/2018  . Colon Cancer Screening  06/20/2020  . Tetanus Vaccine  10/08/2023  . Pneumonia vaccines  Completed  . DEXA scan (bone density measurement)  Addressed   Health Maintenance, Female Adopting a healthy lifestyle and getting preventive care can go a long way to promote health and wellness. Talk with your health care provider about what schedule of regular examinations is right for you. This is a good chance for you to check in with your provider about disease prevention and staying healthy. In between checkups, there are plenty of things you can do on your own. Experts have done a lot of research about which lifestyle changes and preventive measures are most likely to keep you healthy. Ask your health care provider for more information. Weight and diet Eat a healthy diet  Be sure to include plenty of  vegetables, fruits, low-fat dairy products, and lean protein.  Do not eat a lot of foods high in solid fats, added sugars, or salt.  Get regular exercise. This is one of the most important things you can do for your health. ? Most adults should exercise for at least 150 minutes each week. The exercise should increase your heart rate and make you sweat (moderate-intensity exercise). ? Most adults should also do strengthening exercises at least twice a week. This is in addition to the moderate-intensity exercise.  Maintain a healthy weight  Body mass index (BMI) is a measurement that can be used to identify possible weight problems. It estimates body fat based on height and weight. Your health care provider can help determine your BMI and help you achieve or maintain a healthy weight.  For females 65 years of age and older: ? A BMI below 18.5 is considered underweight. ? A BMI of 18.5 to 24.9 is normal. ? A BMI of 25 to 29.9 is considered overweight. ? A BMI of 30 and above is considered obese.  Watch levels of cholesterol and blood lipids  You should start having your blood tested for lipids and cholesterol at 70 years of age, then have this test every 5 years.  You may need to have your cholesterol levels checked more often if: ? Your lipid or cholesterol levels are high. ? You are older than 70 years of age. ? You are  at high risk for heart disease.  Cancer screening Lung Cancer  Lung cancer screening is recommended for adults 92-51 years old who are at high risk for lung cancer because of a history of smoking.  A yearly low-dose CT scan of the lungs is recommended for people who: ? Currently smoke. ? Have quit within the past 15 years. ? Have at least a 30-pack-year history of smoking. A pack year is smoking an average of one pack of cigarettes a day for 1 year.  Yearly screening should continue until it has been 15 years since you quit.  Yearly screening should stop if you  develop a health problem that would prevent you from having lung cancer treatment.  Breast Cancer  Practice breast self-awareness. This means understanding how your breasts normally appear and feel.  It also means doing regular breast self-exams. Let your health care provider know about any changes, no matter how small.  If you are in your 20s or 30s, you should have a clinical breast exam (CBE) by a health care provider every 1-3 years as part of a regular health exam.  If you are 59 or older, have a CBE every year. Also consider having a breast X-ray (mammogram) every year.  If you have a family history of breast cancer, talk to your health care provider about genetic screening.  If you are at high risk for breast cancer, talk to your health care provider about having an MRI and a mammogram every year.  Breast cancer gene (BRCA) assessment is recommended for women who have family members with BRCA-related cancers. BRCA-related cancers include: ? Breast. ? Ovarian. ? Tubal. ? Peritoneal cancers.  Results of the assessment will determine the need for genetic counseling and BRCA1 and BRCA2 testing.  Cervical Cancer Your health care provider may recommend that you be screened regularly for cancer of the pelvic organs (ovaries, uterus, and vagina). This screening involves a pelvic examination, including checking for microscopic changes to the surface of your cervix (Pap test). You may be encouraged to have this screening done every 3 years, beginning at age 31.  For women ages 26-65, health care providers may recommend pelvic exams and Pap testing every 3 years, or they may recommend the Pap and pelvic exam, combined with testing for human papilloma virus (HPV), every 5 years. Some types of HPV increase your risk of cervical cancer. Testing for HPV may also be done on women of any age with unclear Pap test results.  Other health care providers may not recommend any screening for nonpregnant  women who are considered low risk for pelvic cancer and who do not have symptoms. Ask your health care provider if a screening pelvic exam is right for you.  If you have had past treatment for cervical cancer or a condition that could lead to cancer, you need Pap tests and screening for cancer for at least 20 years after your treatment. If Pap tests have been discontinued, your risk factors (such as having a new sexual partner) need to be reassessed to determine if screening should resume. Some women have medical problems that increase the chance of getting cervical cancer. In these cases, your health care provider may recommend more frequent screening and Pap tests.  Colorectal Cancer  This type of cancer can be detected and often prevented.  Routine colorectal cancer screening usually begins at 70 years of age and continues through 70 years of age.  Your health care provider may recommend screening at an  earlier age if you have risk factors for colon cancer.  Your health care provider may also recommend using home test kits to check for hidden blood in the stool.  A small camera at the end of a tube can be used to examine your colon directly (sigmoidoscopy or colonoscopy). This is done to check for the earliest forms of colorectal cancer.  Routine screening usually begins at age 67.  Direct examination of the colon should be repeated every 5-10 years through 70 years of age. However, you may need to be screened more often if early forms of precancerous polyps or small growths are found.  Skin Cancer  Check your skin from head to toe regularly.  Tell your health care provider about any new moles or changes in moles, especially if there is a change in a mole's shape or color.  Also tell your health care provider if you have a mole that is larger than the size of a pencil eraser.  Always use sunscreen. Apply sunscreen liberally and repeatedly throughout the day.  Protect yourself by  wearing long sleeves, pants, a wide-brimmed hat, and sunglasses whenever you are outside.  Heart disease, diabetes, and high blood pressure  High blood pressure causes heart disease and increases the risk of stroke. High blood pressure is more likely to develop in: ? People who have blood pressure in the high end of the normal range (130-139/85-89 mm Hg). ? People who are overweight or obese. ? People who are African American.  If you are 38-47 years of age, have your blood pressure checked every 3-5 years. If you are 61 years of age or older, have your blood pressure checked every year. You should have your blood pressure measured twice-once when you are at a hospital or clinic, and once when you are not at a hospital or clinic. Record the average of the two measurements. To check your blood pressure when you are not at a hospital or clinic, you can use: ? An automated blood pressure machine at a pharmacy. ? A home blood pressure monitor.  If you are between 64 years and 61 years old, ask your health care provider if you should take aspirin to prevent strokes.  Have regular diabetes screenings. This involves taking a blood sample to check your fasting blood sugar level. ? If you are at a normal weight and have a low risk for diabetes, have this test once every three years after 70 years of age. ? If you are overweight and have a high risk for diabetes, consider being tested at a younger age or more often. Preventing infection Hepatitis B  If you have a higher risk for hepatitis B, you should be screened for this virus. You are considered at high risk for hepatitis B if: ? You were born in a country where hepatitis B is common. Ask your health care provider which countries are considered high risk. ? Your parents were born in a high-risk country, and you have not been immunized against hepatitis B (hepatitis B vaccine). ? You have HIV or AIDS. ? You use needles to inject street drugs. ? You  live with someone who has hepatitis B. ? You have had sex with someone who has hepatitis B. ? You get hemodialysis treatment. ? You take certain medicines for conditions, including cancer, organ transplantation, and autoimmune conditions.  Hepatitis C  Blood testing is recommended for: ? Everyone born from [birth year] through 1963. ? Anyone with known risk factors for hepatitis  C.  Sexually transmitted infections (STIs)  You should be screened for sexually transmitted infections (STIs) including gonorrhea and chlamydia if: ? You are sexually active and are younger than 70 years of age. ? You are older than 70 years of age and your health care provider tells you that you are at risk for this type of infection. ? Your sexual activity has changed since you were last screened and you are at an increased risk for chlamydia or gonorrhea. Ask your health care provider if you are at risk.  If you do not have HIV, but are at risk, it may be recommended that you take a prescription medicine daily to prevent HIV infection. This is called pre-exposure prophylaxis (PrEP). You are considered at risk if: ? You are sexually active and do not regularly use condoms or know the HIV status of your partner(s). ? You take drugs by injection. ? You are sexually active with a partner who has HIV.  Talk with your health care provider about whether you are at high risk of being infected with HIV. If you choose to begin PrEP, you should first be tested for HIV. You should then be tested every 3 months for as long as you are taking PrEP. Pregnancy  If you are premenopausal and you may become pregnant, ask your health care provider about preconception counseling.  If you may become pregnant, take 400 to 800 micrograms (mcg) of folic acid every day.  If you want to prevent pregnancy, talk to your health care provider about birth control (contraception). Osteoporosis and menopause  Osteoporosis is a disease in which the  bones lose minerals and strength with aging. This can result in serious bone fractures. Your risk for osteoporosis can be identified using a bone density scan.  If you are 14 years of age or older, or if you are at risk for osteoporosis and fractures, ask your health care provider if you should be screened.  Ask your health care provider whether you should take a calcium or vitamin D supplement to lower your risk for osteoporosis.  Menopause may have certain physical symptoms and risks.  Hormone replacement therapy may reduce some of these symptoms and risks. Talk to your health care provider about whether hormone replacement therapy is right for you. Follow these instructions at home:  Schedule regular health, dental, and eye exams.  Stay current with your immunizations.  Do not use any tobacco products including cigarettes, chewing tobacco, or electronic cigarettes.  If you are pregnant, do not drink alcohol.  If you are breastfeeding, limit how much and how often you drink alcohol.  Limit alcohol intake to no more than 1 drink per day for nonpregnant women. One drink equals 12 ounces of beer, 5 ounces of wine, or 1 ounces of hard liquor.  Do not use street drugs.  Do not share needles.  Ask your health care provider for help if you need support or information about quitting drugs.  Tell your health care provider if you often feel depressed.  Tell your health care provider if you have ever been abused or do not feel safe at home. This information is not intended to replace advice given to you by your health care provider. Make sure you discuss any questions you have with your health care provider. Document Released: 10/01/2010 Document Revised: 08/24/2015 Document Reviewed: 12/20/2014 Elsevier Interactive Patient Education  [employer].

## 2017-09-19 ENCOUNTER — Other Ambulatory Visit: Payer: Self-pay | Admitting: Internal Medicine

## 2017-09-19 DIAGNOSIS — E039 Hypothyroidism, unspecified: Secondary | ICD-10-CM

## 2017-09-29 ENCOUNTER — Encounter: Payer: Self-pay | Admitting: Internal Medicine

## 2017-09-29 NOTE — Progress Notes (Signed)
Abstracted and sent to scan  

## 2017-10-06 ENCOUNTER — Other Ambulatory Visit: Payer: Self-pay

## 2017-10-10 ENCOUNTER — Other Ambulatory Visit (INDEPENDENT_AMBULATORY_CARE_PROVIDER_SITE_OTHER): Payer: Medicare Other

## 2017-10-10 DIAGNOSIS — E1165 Type 2 diabetes mellitus with hyperglycemia: Secondary | ICD-10-CM

## 2017-10-10 DIAGNOSIS — E063 Autoimmune thyroiditis: Secondary | ICD-10-CM | POA: Diagnosis not present

## 2017-10-10 DIAGNOSIS — Z794 Long term (current) use of insulin: Secondary | ICD-10-CM

## 2017-10-10 LAB — BASIC METABOLIC PANEL
BUN: 13 mg/dL (ref 6–23)
CALCIUM: 9.5 mg/dL (ref 8.4–10.5)
CO2: 29 mEq/L (ref 19–32)
Chloride: 102 mEq/L (ref 96–112)
Creatinine, Ser: 0.85 mg/dL (ref 0.40–1.20)
GFR: 85 mL/min (ref >60.00)
GLUCOSE: 97 mg/dL (ref 70–99)
Potassium: 3.5 mEq/L (ref 3.5–5.1)
SODIUM: 141 meq/L (ref 135–145)

## 2017-10-10 LAB — MICROALBUMIN / CREATININE URINE RATIO
CREATININE, U: 65.5 mg/dL
MICROALB UR: 27.2 mg/dL — AB (ref 0.0–1.9)
MICROALB/CREAT RATIO: 41.6 mg/g — AB (ref 0.0–30.0)

## 2017-10-10 LAB — TSH: TSH: 1.95 u[IU]/mL (ref 0.35–4.50)

## 2017-10-10 LAB — T4, FREE: Free T4: 1.05 ng/dL (ref 0.60–1.60)

## 2017-10-10 LAB — HEMOGLOBIN A1C: HEMOGLOBIN A1C: 7.6 % — AB (ref 4.6–6.5)

## 2017-10-15 ENCOUNTER — Ambulatory Visit: Payer: Medicare Other | Admitting: Endocrinology

## 2017-10-15 ENCOUNTER — Encounter: Payer: Self-pay | Admitting: Endocrinology

## 2017-10-15 VITALS — BP 132/84 | HR 80 | Ht 65.0 in | Wt 243.0 lb

## 2017-10-15 DIAGNOSIS — E1165 Type 2 diabetes mellitus with hyperglycemia: Secondary | ICD-10-CM | POA: Diagnosis not present

## 2017-10-15 DIAGNOSIS — I10 Essential (primary) hypertension: Secondary | ICD-10-CM

## 2017-10-15 DIAGNOSIS — M25471 Effusion, right ankle: Secondary | ICD-10-CM | POA: Diagnosis not present

## 2017-10-15 DIAGNOSIS — Z794 Long term (current) use of insulin: Secondary | ICD-10-CM | POA: Diagnosis not present

## 2017-10-15 DIAGNOSIS — M25472 Effusion, left ankle: Secondary | ICD-10-CM | POA: Diagnosis not present

## 2017-10-15 NOTE — Patient Instructions (Addendum)
Check blood sugars on waking up  3/7  Also check blood sugars about 2 hours after a meal and do this after different meals by rotation  Recommended blood sugar levels on waking up is 90-130 and about 2 hours after meal is 130-160  Please bring your blood sugar monitor to each visit, thank you   INDOOR EXERCISE IDEAS   Use the following examples for a creative indoor workout (perform each move for 2-3 minutes):   Warm up. Put on some music that makes you feel like moving, and dance around the living room.  Watch exercise shows on TV and move along with them. There are tons of free cable channels that have daily exercise shows on them for all levels - beginner through advanced.   You can easily find a number of exercise videos but use one that will suit your liking and exercise level; you can do these on your own schedule.  Walk up and down the steps.  Do dumbbell curls and presses (if you don't have weights, use full water bottles).  Do assisted squats, keeping your back on a fitness ball against the wall or using the back of the couch for support.  Shadow box: Lift and lower the left leg; jab with the right arm, then the left; then lift and lower the right leg.  Fence (you don't even need swords). Pretend you're holding a sword in each hand. Create an X pattern standing still, then moving forward and back.  Hop on your exercise bike or treadmill -- or, for something different, use a weighted hula hoop. If you don't have any of those, just go back to dancing.  Do abdominal crunches (hold a weighted ball for added resistance).  Cool down with [employer] "I Feel Good" -- or whatever tune makes you feel good   TAKE 1/2 AMLODIPINE, GO up to 320 on Valsartan

## 2017-10-15 NOTE — Progress Notes (Signed)
Patient ID: Marissa Roberts, female   DOB: 03-Nov-1945, 72 y.o.   MRN: [id number]               Reason for Appointment:  Follow-up for diabetes and hypertension   History of Present Illness:             Type 2 diabetes mellitus, date of diagnosis: 2005        Recent history:   INSULIN regimen is  Tresiba 54 units am , Novolog  15-20 units bid- tid ac  Oral hypoglycemic drugs the patient is taking are: Metformin 2 g daily  Her  A1c is now higher 7.6 compared to 7.1   Current blood sugar patterns, management and problems identified:  She is still not checking her blood sugars more than in the morning once a day and despite reminders  She does not understand that because her A1c is high that she will have high readings after meals which she needs to monitor  Also does not understand how much her blood sugar should be after meals  Although she thinks she is trying to take her NovoLog more consistently before each meal not clear if she is having high readings at any given time  Also she tries to take her NovoLog for a late evening meal she will reduce the dose by 5 units for fear of hypoglycemia  Her weight is about the same  She is not motivated to exercise again  Currently is trying to eliminate regular soft drinks  She usually takes 20 units of NovoLog for most of her meals     Side effects from medications have been:none  Compliance with the medical regimen: Fair  Glucose monitoring:  done 1 time a day         Glucometer:  One Touch.      Blood Glucose readings from  FASTING blood sugars: Recent range 82-159 with median 114  PREVIOUS readings:  PRE-MEAL Fasting Lunch Dinner Bedtime Overall  Glucose range:  93-159   105    Mean/median: 115       POST-MEAL PC Breakfast PC Lunch PC Dinner  Glucose range:   ?  Mean/median:       Self-care:   Meals: 3 meals per day. Breakfast is yogurt or toast, bacon, egg; usually not a lot of carbohydrates, Periodically  fried food including fish, french fries.    Not always restricting fat intake or sweet tea  when eating out        Exercise:  none  Dietician visit, most recent:2014.               Weight history:  Wt Readings from Last 3 Encounters:  10/15/17 243 lb (110.2 kg)  09/16/17 245 lb (111.1 kg)  09/16/17 245 lb (111.1 kg)    Glycemic control:   Lab Results  Component Value Date   HGBA1C 7.6 (H) 10/10/2017   HGBA1C 7.1 (H) 07/11/2017   HGBA1C 7.4 (H) 04/11/2017   Lab Results  Component Value Date   MICROALBUR 27.2 (H) 10/10/2017   LDLCALC 64 04/11/2017   CREATININE 0.85 10/10/2017   No results found for: FRUCTOSAMINE    Past history:  She thinks her blood sugars were only mildly increased at borderline levels at the onsetper Most likely she was treated with metformin initially and this has been continued.   At some point she was also tried on [place] and [place] for improving her control. She is not sure why she  was started on insulin 5 years ago, presumably for worsening hyperglycemia Also not clear if she has tried various insulin regimens before starting Lantus and NovoLog  Previously had tried the V-go pump but subsequently she stopped doing the V-go pump because of the cost, skin irritation and local discomfort   Allergies as of 10/15/2017      Reactions   Contrast Media [iodinated Diagnostic Agents]    Throat swells from ct con tra   Adhesive [tape]    No paper tape, causes blisters   Erythromycin    REACTION: rash   Penicillins    REACTION: rash      Medication List        Accurate as of 10/15/17  1:50 PM. Always use your most recent med list.          amLODipine 10 MG tablet Commonly known as:  NORVASC TAKE 1 TABLET BY MOUTH ONCE DAILY   CALCIUM 600 1500 (600 Ca) MG Tabs tablet Generic drug:  calcium carbonate Take 2 tablets by mouth daily.   cyanocobalamin 100 MCG tablet Take 100 mcg by mouth daily.   fluticasone 50 MCG/ACT nasal  spray Commonly known as:  FLONASE Place 2 sprays into both nostrils daily.   furosemide 40 MG tablet Commonly known as:  LASIX TAKE 2 TABLETS BY MOUTH EVERY DAY   glucosamine-chondroitin 500-400 MG tablet Take 1 tablet by mouth daily.   glucose blood test strip Commonly known as:  ONETOUCH VERIO Please check blood sugars at least half the time about 2 hours after any meal and 3 times per day on waking up. Dx code E11.9   HYDROcodone-homatropine 5-1.5 MG/5ML syrup Commonly known as:  HYDROMET Take 5 mLs by mouth every 6 (six) hours as needed for cough.   insulin aspart 100 UNIT/ML FlexPen Commonly known as:  NOVOLOG FLEXPEN INJECT UP TO 20 UNITS INTO THE SKIN BEFORE EACH MEAL   Insulin Pen Needle 31G X 8 MM Misc Commonly known as:  EASY TOUCH PEN NEEDLES USE TO INJECT LANTUS AND NOVOLOG 4 TIMES PER DAY-DX CODE E11.9   Insulin Pen Needle 32G X 4 MM Misc Commonly known as:  BD PEN NEEDLE NANO U/F USE TO INJECT INSULIN 4 TIMES DAILY   levothyroxine 137 MCG tablet Commonly known as:  SYNTHROID, LEVOTHROID TAKE ONE TABLET BY MOUTH ONCE DAILY   levothyroxine 137 MCG tablet Commonly known as:  SYNTHROID, LEVOTHROID TAKE 1 TABLET BY MOUTH ONCE DAILY   LINZESS 290 MCG Caps capsule Generic drug:  linaclotide Take 1 capsule (290 mcg total) by mouth daily.   magnesium oxide 400 MG tablet Commonly known as:  MAG-OX Take 400 mg by mouth daily.   metFORMIN 1000 MG tablet Commonly known as:  GLUCOPHAGE TAKE 1 TABLET BY MOUTH TWICE DAILY WITH MEALS   montelukast 10 MG tablet Commonly known as:  SINGULAIR TAKE 1 TABLET BY MOUTH EVERYDAY AT BEDTIME   ONETOUCH DELICA LANCETS 07P Misc USE TO CHECK BLOOD SUGAR UP TO THREE TIMES A DAY 2 HOURS AFTER A MEAL AND UP TO 3 TIMES A WEEK UPON WAKING UP   oseltamivir 75 MG capsule Commonly known as:  TAMIFLU Take 1 capsule (75 mg total) by mouth every 12 (twelve) hours.   potassium chloride 10 MEQ tablet Commonly known as:  K-DUR Take  1 tablet (10 mEq total) by mouth 2 (two) times daily.   PROBIOTIC DAILY PO Take by mouth.   rosuvastatin 40 MG tablet Commonly known as:  CRESTOR Take  1 tablet (40 mg total) by mouth daily.   traMADol 50 MG tablet Commonly known as:  ULTRAM Take 1 tablet (50 mg total) by mouth every 12 (twelve) hours as needed.   TRESIBA FLEXTOUCH 200 UNIT/ML Sopn Generic drug:  Insulin Degludec INJECT 52 UNITS INTO THE SKIN EVERY MORNING.   valsartan-hydrochlorothiazide 160-12.5 MG tablet Commonly known as:  DIOVAN-HCT TAKE 1 TABLET BY MOUTH ONCE DAILY       Allergies:  Allergies  Allergen Reactions  . Contrast Media [Iodinated Diagnostic Agents]     Throat swells from ct con tra  . Adhesive [Tape]     No paper tape, causes blisters  . Erythromycin     REACTION: rash  . Penicillins     REACTION: rash    Past Medical History:  Diagnosis Date  . Arthritis   . Bronchitis    hx of  . CHF (congestive heart failure) ([place])   . Diabetes mellitus   . GERD (gastroesophageal reflux disease)   . Hypercholesteremia   . Hypertension   . Hypothyroidism   . Pneumonia    hx of  . PONV (postoperative nausea and vomiting)   . Thyroid disease     Past Surgical History:  Procedure Laterality Date  . ABDOMINAL HYSTERECTOMY     partial  . BREAST SURGERY     lumpectomy left breast  . carpel tunnel    . COLONOSCOPY W/ POLYPECTOMY    . FOOT SURGERY    . knee arhtroscopy    . SHOULDER ARTHROSCOPY WITH SUBACROMIAL DECOMPRESSION, ROTATOR CUFF REPAIR AND BICEP TENDON REPAIR Right 05/26/2012   Procedure: Right Shoulder Diagnostic Operative Arthroscopy, Subacromial Decompression, Biceps Tenodesis, Mini Open Rotator Cuff Repair;  Surgeon: [name], MD;  Location: [facility];  Service: Orthopedics;  Laterality: Right;  Right Shoulder Diagnostic Operative Arthroscopy, Subacromial Decompression, Biceps Tenodesis, Mini Open Rotator Cuff Repair  . TONSILLECTOMY      Family History  Adopted: Yes   Problem Relation Age of Onset  . Diabetes Mother   . Heart disease Mother   . Hyperlipidemia Mother   . Coronary artery disease Unknown     Social History:  reports that she quit smoking about 27 years ago. She has never used smokeless tobacco. She reports that she does not drink alcohol or use drugs.    Review of Systems   HYPERTENSION: This is well controlled, patient is on amlodipine 10 mg daily and Diovan HCT 160/12.5 1+ edema present  On Lasix as needed for edema also but this is not well controlled  BP Readings from Last 3 Encounters:  10/15/17 132/84  09/16/17 130/72  09/16/17 130/72    LIPIDS:  She is taking Crestor 40 mg, previously LDL was high with simvastatin 20 mg LDL below 100        Lab Results  Component Value Date   CHOL 144 04/11/2017   HDL 51.70 04/11/2017   LDLCALC 64 04/11/2017   LDLDIRECT 129.5 02/06/2012   TRIG 142.0 04/11/2017   CHOLHDL 3 04/11/2017                  Thyroid:     She has been hypothyroid for over 40 years and is taking a supplement of 137 g levothyroxine, 6 tablets per week  Last labs:   Lab Results  Component Value Date   TSH 1.95 10/10/2017   TSH 1.03 04/11/2017   TSH 1.13 10/08/2016   FREET4 1.05 10/10/2017   FREET4 1.13 04/11/2017  FREET4 1.03 09/26/2015       Physical Examination:  BP 132/84   Pulse 80   Ht 5\' 5"  (1.651 m)   Wt 243 lb (110.2 kg)   SpO2 98%   BMI 40.44 kg/m   She has 1-2+ edema on her lower legs    ASSESSMENT/PLAN:  DIABETES on insulin:  See history of present illness for detailed discussion of current diabetes management, blood sugar patterns and problems identified  A1c is now 7.6  This is despite her fasting blood sugars averaging 119 and as low as 82  She is on basal bolus insulin and metformin She was explained that her A1c being higher than expected for her morning readings she likely has high postprandial readings which she does not monitor This may depend on her meal  size and carbohydrate and how she is adjusting her NovoLog especially when she is cutting back for late dinners She can also do better with exercise Not able to lose much weight   Recommendations:   She was given ideas about doing indoor exercises since he does not like to go walking  She will need to this regularly start doing blood sugars after meals  Given her flowsheet to keep records of her glucose readings and she can alternate fasting and postprandial reading since she does not want to check more than once a day  Will need to adjust her NovoLog if her blood sugars are over 180 for any given meals  No change in basal insulin or Metformin  Recheck A1c on next visit  HYPERTENSION: Since she has edema this may be likely to be from 10 mg Norvasc She will cut down Norvasc to 5 mg and increase her Diovan by 160 mg Encouraged her to continue low-sodium diet and call if edema not controlled  HYPOTHYROIDISM: She will continue the same regimen  Counseling time on subjects discussed in assessment and plan sections is over 50% of today's 25 minute visit   Patient Instructions  Check blood sugars on waking up  3/7  Also check blood sugars about 2 hours after a meal and do this after different meals by rotation  Recommended blood sugar levels on waking up is 90-130 and about 2 hours after meal is 130-160  Please bring your blood sugar monitor to each visit, thank you   INDOOR EXERCISE IDEAS   Use the following examples for a creative indoor workout (perform each move for 2-3 minutes):   Warm up. Put on some music that makes you feel like moving, and dance around the living room.  Watch exercise shows on TV and move along with them. There are tons of free cable channels that have daily exercise shows on them for all levels - beginner through advanced.   You can easily find a number of exercise videos but use one that will suit your liking and exercise level; you can do these on  your own schedule.  Walk up and down the steps.  Do dumbbell curls and presses (if you don't have weights, use full water bottles).  Do assisted squats, keeping your back on a fitness ball against the wall or using the back of the couch for support.  Shadow box: Lift and lower the left leg; jab with the right arm, then the left; then lift and lower the right leg.  Fence (you don't even need swords). Pretend you're holding a sword in each hand. Create an X pattern standing still, then moving forward and back.  Hop on your exercise bike or treadmill -- or, for something different, use a weighted hula hoop. If you don't have any of those, just go back to dancing.  Do abdominal crunches (hold a weighted ball for added resistance).  Cool down with [employer] "I Feel Good" -- or whatever tune makes you feel good   TAKE 1/2 AMLODIPINE, GO up to 320 on Valsartan    Marissa Roberts 10/15/2017, 1:50 PM   Note: This office note was prepared with Dragon voice recognition system technology. Any transcriptional errors that result from this process are unintentional.

## 2017-10-17 ENCOUNTER — Other Ambulatory Visit: Payer: Self-pay | Admitting: Internal Medicine

## 2017-10-20 ENCOUNTER — Other Ambulatory Visit: Payer: Self-pay | Admitting: Endocrinology

## 2017-10-20 ENCOUNTER — Other Ambulatory Visit: Payer: Self-pay

## 2017-10-20 MED ORDER — AMLODIPINE BESYLATE 5 MG PO TABS: 5.0000 mg | ORAL_TABLET | Freq: Every day | ORAL | 3 refills | Status: DC

## 2017-10-20 MED ORDER — VALSARTAN-HYDROCHLOROTHIAZIDE 320-12.5 MG PO TABS: 1.0000 | ORAL_TABLET | Freq: Every day | ORAL | 3 refills | Status: DC

## 2017-10-23 ENCOUNTER — Other Ambulatory Visit: Payer: Self-pay

## 2017-10-23 MED ORDER — AMLODIPINE BESYLATE 5 MG PO TABS: 5.0000 mg | ORAL_TABLET | Freq: Every day | ORAL | 3 refills | Status: DC

## 2017-10-23 MED ORDER — VALSARTAN-HYDROCHLOROTHIAZIDE 320-12.5 MG PO TABS: 1.0000 | ORAL_TABLET | Freq: Every day | ORAL | 3 refills | Status: DC

## 2017-10-24 ENCOUNTER — Other Ambulatory Visit: Payer: Self-pay | Admitting: Endocrinology

## 2017-10-31 ENCOUNTER — Ambulatory Visit (INDEPENDENT_AMBULATORY_CARE_PROVIDER_SITE_OTHER): Payer: Medicare Other | Admitting: Podiatry

## 2017-10-31 ENCOUNTER — Encounter: Payer: Self-pay | Admitting: Podiatry

## 2017-10-31 DIAGNOSIS — M79675 Pain in left toe(s): Secondary | ICD-10-CM

## 2017-10-31 DIAGNOSIS — E118 Type 2 diabetes mellitus with unspecified complications: Secondary | ICD-10-CM

## 2017-10-31 DIAGNOSIS — M79674 Pain in right toe(s): Secondary | ICD-10-CM

## 2017-10-31 DIAGNOSIS — E1165 Type 2 diabetes mellitus with hyperglycemia: Secondary | ICD-10-CM | POA: Diagnosis not present

## 2017-10-31 DIAGNOSIS — L84 Corns and callosities: Secondary | ICD-10-CM | POA: Diagnosis not present

## 2017-10-31 DIAGNOSIS — B351 Tinea unguium: Secondary | ICD-10-CM | POA: Diagnosis not present

## 2017-10-31 DIAGNOSIS — IMO0002 Reserved for concepts with insufficient information to code with codable children: Secondary | ICD-10-CM

## 2017-11-02 NOTE — Progress Notes (Signed)
Subjective: Marissa Roberts is a 72 y.o. y.o. female who presents today with cc of painful, discolored, thick toenails and painful callus/corn which interfere with daily activities. Pain is aggravated when wearing enclosed shoe gear. She also has b/l calluses and corn which is painful when weightbearing with and without enclosed shoe gear.  Pain is relieved with periodic professional debridement.  Objective:  Vascular Examination: Capillary refill time immediate x 10 digits Dorsalis pedis and Posterior tibial pulses present b/l Pedal hair growth present. No edema b/l Skin temperature gradient normal b/l  Dermatological Examination: Skin with normal turgor, texture and tone b/l Toenails 1-5 b/l discolored, thick, dystrophic with subungual debris and pain with palpation to nailbeds due to thickness of nails. Hyperkeratotic lesion plantarmedial hallux b/l and right dorsal 5th PIPJ  Musculoskeletal: Muscle strength 5/5 to all LE muscle groups No gross bony abnormalities b/l  Neurological: Sensation intact with 10 gram monofilament. Vibratory sensation intact  Assessment: 1. Painful onychomycosis toenails 1-5 b/l 2.  Callus b/l hallux 3.  Corn right 5th digit 3.  NIDDM with complications  Plan: 1. Continue diabetic foot care principles.  2. Toenails 1-5 b/l were debrided in length and girth without iatrogenic bleeding. 3. Hyperkeratotic lesion pared with sterile chisel blade b/l hallux and right 5th digit 4. Patient to continue soft, supportive shoe gear 5. Patient to report any pedal injuries to medical professional  6. Follow up 3 months.  7. Patient/POA to call should there be a concern in the interim.

## 2017-11-06 ENCOUNTER — Other Ambulatory Visit: Payer: Self-pay | Admitting: Internal Medicine

## 2017-12-02 ENCOUNTER — Other Ambulatory Visit: Payer: Self-pay | Admitting: Endocrinology

## 2017-12-16 DIAGNOSIS — H40023 Open angle with borderline findings, high risk, bilateral: Secondary | ICD-10-CM | POA: Diagnosis not present

## 2017-12-16 DIAGNOSIS — H524 Presbyopia: Secondary | ICD-10-CM | POA: Diagnosis not present

## 2017-12-16 DIAGNOSIS — E119 Type 2 diabetes mellitus without complications: Secondary | ICD-10-CM | POA: Diagnosis not present

## 2017-12-16 LAB — HM DIABETES EYE EXAM

## 2017-12-23 ENCOUNTER — Encounter: Payer: Self-pay | Admitting: *Deleted

## 2017-12-30 ENCOUNTER — Encounter: Payer: Self-pay | Admitting: Endocrinology

## 2017-12-30 DIAGNOSIS — N390 Urinary tract infection, site not specified: Secondary | ICD-10-CM | POA: Diagnosis not present

## 2017-12-30 DIAGNOSIS — R82998 Other abnormal findings in urine: Secondary | ICD-10-CM | POA: Diagnosis not present

## 2018-01-02 ENCOUNTER — Encounter: Payer: Self-pay | Admitting: Podiatry

## 2018-01-02 ENCOUNTER — Ambulatory Visit (INDEPENDENT_AMBULATORY_CARE_PROVIDER_SITE_OTHER): Payer: Medicare Other | Admitting: Podiatry

## 2018-01-02 DIAGNOSIS — M79674 Pain in right toe(s): Secondary | ICD-10-CM

## 2018-01-02 DIAGNOSIS — B351 Tinea unguium: Secondary | ICD-10-CM

## 2018-01-02 DIAGNOSIS — E119 Type 2 diabetes mellitus without complications: Secondary | ICD-10-CM | POA: Diagnosis not present

## 2018-01-02 DIAGNOSIS — Z794 Long term (current) use of insulin: Secondary | ICD-10-CM

## 2018-01-02 DIAGNOSIS — L84 Corns and callosities: Secondary | ICD-10-CM

## 2018-01-02 DIAGNOSIS — M79675 Pain in left toe(s): Secondary | ICD-10-CM | POA: Diagnosis not present

## 2018-01-04 NOTE — Progress Notes (Signed)
Subjective: Marissa Roberts is a 72 y.o. y.o. female presents today for preventative diabetic foot care.   She presents for painful, discolored, thick toenails and painful corns which interfere with daily activities. Pain is aggravated when wearing enclosed shoe gear and is relieved with periodic professional debridement.  She states her left 5th toe is tender on today.   Objective: Vascular Examination: Capillary refill time immediate x 10 digits Dorsalis pedis and Posterior tibial pulses present b/l Pedal hair growth present b/l Skin temperature gradient WNL b/l  Dermatological Examination: Skin with normal turgor, texture and tone b/l Toenails 1-5 right, 1-4 left discolored, thick, dystrophic with subungual debris and pain with palpation to nailbeds due to thickness of nails. Anonychia left 5th toe with nailbed intact with hyperkeratosis. Hyperkeratotic lesion plantarmedial hallux IPJ b/l, dorsal 5th PIPJ b/l and dorsal 5th nailbed left 5th toe. No erythema, no edema, no drainage, no purulence.  Musculoskeletal: Muscle strength 5/5 to all LE muscle groups Hammertoe deformity b/l 5th digit  Neurological: Sensation intact with 10 gram monofilament.  Assessment: 1. Painful onychomycosis toenails 1-5 right, 1-4 left 2.   Callus b/l hallux IPJ 3    Corn b/l 5th digit PIPJ, left 5th digit nailbed 4.   NIDDM  Plan: 1. Continue diabetic foot care principles.  2. Toenails1-5 right, 1-4 left were debrided in length and girth without iatrogenic bleeding. 3. Callus b/l hallux IPJ and Corn b/l 5th digit PIPJ, left 5th digit nailbed pared with sterile chisel blade (total 5 lesions) 4. Patient to continue soft, supportive shoe gear 5. Patient to report any pedal injuries to medical professional  6. Follow up 3 months.  7. Patient/POA to call should there be a concern in the interim.

## 2018-01-08 ENCOUNTER — Encounter (INDEPENDENT_AMBULATORY_CARE_PROVIDER_SITE_OTHER): Payer: Self-pay | Admitting: Family Medicine

## 2018-01-08 ENCOUNTER — Ambulatory Visit (INDEPENDENT_AMBULATORY_CARE_PROVIDER_SITE_OTHER): Payer: Medicare Other | Admitting: Family Medicine

## 2018-01-08 DIAGNOSIS — M545 Low back pain, unspecified: Secondary | ICD-10-CM

## 2018-01-08 MED ORDER — TRAMADOL HCL 50 MG PO TABS
50.0000 mg | ORAL_TABLET | Freq: Four times a day (QID) | ORAL | 0 refills | Status: DC | PRN
Start: 2018-01-08 — End: 2018-01-19

## 2018-01-08 MED ORDER — DICLOFENAC SODIUM 1 % TD GEL: 4.0000 g | Freq: Four times a day (QID) | TRANSDERMAL | 6 refills | Status: DC | PRN

## 2018-01-08 NOTE — Progress Notes (Signed)
Office Visit Note   Patient: Marissa Roberts           Date of Birth: 23-Apr-1945           MRN: [id number] Visit Date: 01/08/2018 Requested by: [name], MD [place], [place] [id number] PCP: [name], MD  Subjective: Chief Complaint  Patient presents with  . Lower Back - Pain    Pain in lower back, mainly on the left side, radiating down the left leg. No numbness/tingling in LLE. Started 2 days ago.  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    HPI: She is here with left-sided low back and leg pain.  Symptoms started Monday, she woke up feeling pain.  She has had this before in the past and it went away with tramadol but she was afraid to take her prescription because it is 70 years old.  No injury to cause this.  She is currently attending a church conference and the seats are not comfortable, it makes her pain much worse.  Blood sugars have been adequately controlled.  No new medications, but her chart lists 2 different cholesterol medicines.  She thinks she is only taking Crestor.              ROS: Otherwise noncontributory  Objective: Vital Signs: There were no vitals taken for this visit.  Physical Exam:  Low back: No visible rash.  No tenderness along the spinous processes.  There is some pain in the left sciatic notch and also over the greater trochanter.  Negative straight leg raise, no pain with internal hip rotation.  Lower extremity strength and reflexes are normal.  Imaging: None today.  Assessment & Plan: 1.  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  She cannot take oral NSAIDs due to her other medications and health problems.  Consider physical therapy if symptoms persist.   Follow-Up Instructions: No follow-ups on file.       Procedures: None today.   PMFS  History: Patient Active Problem List   Diagnosis Date Noted  . Sprain of calcaneofibular ligament of right ankle 06/06/2016  . Routine general medical examination at a health care facility 02/07/2015  . Morbid obesity ([place]) 04/19/2013  . Hyperlipidemia associated with type 2 diabetes mellitus ([place]) 07/28/2009  . Osteopenia 03/14/2009  . Myalgia 01/12/2009  . Diabetes mellitus type 2, uncontrolled, with complications ([place]) 26/71/2458  . Hypothyroidism 09/24/2006  . Essential hypertension 09/24/2006   Past Medical History:  Diagnosis Date  . Arthritis   . Bronchitis    hx of  . CHF (congestive heart failure) ([place])   . Diabetes mellitus   . GERD (gastroesophageal reflux disease)   . Hypercholesteremia   . Hypertension   . Hypothyroidism   . Pneumonia    hx of  . PONV (postoperative nausea and vomiting)   . Thyroid disease     Family History  Adopted: Yes  Problem Relation Age of Onset  . Diabetes Mother   . Heart disease Mother   . Hyperlipidemia Mother   . Coronary artery disease Unknown     Past Surgical History:  Procedure Laterality Date  . ABDOMINAL HYSTERECTOMY     partial  . BREAST SURGERY     lumpectomy left breast  . carpel tunnel    . COLONOSCOPY  W/ POLYPECTOMY    . FOOT SURGERY    . knee arhtroscopy    . SHOULDER ARTHROSCOPY WITH SUBACROMIAL DECOMPRESSION, ROTATOR CUFF REPAIR AND BICEP TENDON REPAIR Right 05/26/2012   Procedure: Right Shoulder Diagnostic Operative Arthroscopy, Subacromial Decompression, Biceps Tenodesis, Mini Open Rotator Cuff Repair;  Surgeon: [name], MD;  Location: [facility];  Service: Orthopedics;  Laterality: Right;  Right Shoulder Diagnostic Operative Arthroscopy, Subacromial Decompression, Biceps Tenodesis, Mini Open Rotator Cuff Repair  . TONSILLECTOMY     Social History   Occupational History  . Not on file  Tobacco Use  . Smoking status: Former Smoker    Last attempt to quit: 04/01/1990    Years since quitting: 27.7  .  Smokeless tobacco: Never Used  Substance and Sexual Activity  . Alcohol use: No  . Drug use: No  . Sexual activity: Never    Birth control/protection: Abstinence

## 2018-01-12 ENCOUNTER — Other Ambulatory Visit (INDEPENDENT_AMBULATORY_CARE_PROVIDER_SITE_OTHER): Payer: Medicare Other

## 2018-01-12 DIAGNOSIS — E1165 Type 2 diabetes mellitus with hyperglycemia: Secondary | ICD-10-CM

## 2018-01-12 DIAGNOSIS — Z794 Long term (current) use of insulin: Secondary | ICD-10-CM | POA: Diagnosis not present

## 2018-01-12 LAB — BASIC METABOLIC PANEL
BUN: 14 mg/dL (ref 6–23)
CALCIUM: 9.5 mg/dL (ref 8.4–10.5)
CO2: 33 meq/L — AB (ref 19–32)
Chloride: 102 mEq/L (ref 96–112)
Creatinine, Ser: 0.72 mg/dL (ref 0.40–1.20)
GFR: 102.87 mL/min (ref >60.00)
Glucose, Bld: 119 mg/dL — ABNORMAL HIGH (ref 70–99)
POTASSIUM: 3.9 meq/L (ref 3.5–5.1)
SODIUM: 141 meq/L (ref 135–145)

## 2018-01-12 LAB — HEMOGLOBIN A1C: Hgb A1c MFr Bld: 7.2 % — ABNORMAL HIGH (ref 4.6–6.5)

## 2018-01-16 ENCOUNTER — Ambulatory Visit: Payer: Medicare Other | Admitting: Endocrinology

## 2018-01-16 ENCOUNTER — Encounter: Payer: Self-pay | Admitting: Endocrinology

## 2018-01-16 VITALS — BP 136/72 | HR 78 | Wt 240.0 lb

## 2018-01-16 DIAGNOSIS — E785 Hyperlipidemia, unspecified: Secondary | ICD-10-CM

## 2018-01-16 DIAGNOSIS — E1169 Type 2 diabetes mellitus with other specified complication: Secondary | ICD-10-CM | POA: Diagnosis not present

## 2018-01-16 DIAGNOSIS — E1165 Type 2 diabetes mellitus with hyperglycemia: Secondary | ICD-10-CM

## 2018-01-16 DIAGNOSIS — Z23 Encounter for immunization: Secondary | ICD-10-CM

## 2018-01-16 DIAGNOSIS — Z794 Long term (current) use of insulin: Secondary | ICD-10-CM | POA: Diagnosis not present

## 2018-01-16 MED ORDER — INSULIN DEGLUDEC 200 UNIT/ML ~~LOC~~ SOPN: 54.0000 [IU] | PEN_INJECTOR | Freq: Every morning | SUBCUTANEOUS | 3 refills | Status: DC

## 2018-01-16 NOTE — Patient Instructions (Addendum)
Walk daily  Check blood sugars on waking up days a week  Also check blood sugars about 2 hours after meals and do this after different meals by rotation  Recommended blood sugar levels on waking up are 90-130 and about 2 hours after meal is 130-160  Please bring your blood sugar monitor to each visit, thank you

## 2018-01-16 NOTE — Progress Notes (Signed)
Patient ID: Marissa Roberts, female   DOB: 1946/01/12, 72 y.o.   MRN: [id number]               Reason for Appointment:  Follow-up for diabetes and hypertension   History of Present Illness:             Type 2 diabetes mellitus, date of diagnosis: 2005        Recent history:   INSULIN regimen is  Tresiba 54 units am , Novolog  15-20 units bid- tid ac  Oral hypoglycemic drugs the patient is taking are: Metformin 2 g daily  Her  A1c is now slightly better at 7.2, previously 7.6   Current blood sugar patterns, management and problems identified:  Difficult to know when she has high blood sugar since she again forgets to check her sugars after meals  Also morning blood sugars seem to be variable, likely to be based on how they are the night before  Have discussed that the NovoLog dose needs to be adjusted based on readings 2 to 3 hours after meals especially at night  Also he has not exercised as discussed before  She will try to cut back on fried food and sweet tea but not consistently  Weight is slightly better  No hypoglycemia also     Side effects from medications have been:none  Compliance with the medical regimen: Fair  Glucose monitoring:  done 1 time a day         Glucometer:  One Touch.      Blood Glucose readings from   PRE-MEAL Fasting Lunch Dinner Bedtime Overall  Glucose range:  81-169   138    Mean/median:  120       POST-MEAL PC Breakfast PC Lunch PC Dinner  Glucose range:   ?  Mean/median:      Previous readings:  PRE-MEAL Fasting Lunch Dinner Bedtime Overall  Glucose range:  93-159   105    Mean/median: 115       POST-MEAL PC Breakfast PC Lunch PC Dinner  Glucose range:   ?  Mean/median:       Self-care:   Meals: 3 meals per day. Breakfast is yogurt or toast, bacon, egg; usually not a lot of carbohydrates, Periodically fried food including fish, french fries.    Not always restricting fat intake or sweet tea  when eating out          Exercise:  none  Dietician visit, most recent:2014.               Weight history:  Wt Readings from Last 3 Encounters:  01/16/18 240 lb (108.9 kg)  10/15/17 243 lb (110.2 kg)  09/16/17 245 lb (111.1 kg)    Glycemic control:   Lab Results  Component Value Date   HGBA1C 7.2 (H) 01/12/2018   HGBA1C 7.6 (H) 10/10/2017   HGBA1C 7.1 (H) 07/11/2017   Lab Results  Component Value Date   MICROALBUR 27.2 (H) 10/10/2017   LDLCALC 64 04/11/2017   CREATININE 0.72 01/12/2018   No results found for: FRUCTOSAMINE    Past history:  She thinks her blood sugars were only mildly increased at borderline levels at the onsetper Most likely she was treated with metformin initially and this has been continued.   At some point she was also tried on [place] and [place] for improving her control. She is not sure why she was started on insulin 5 years ago, presumably for worsening hyperglycemia Also  not clear if she has tried various insulin regimens before starting Lantus and NovoLog  Previously had tried the V-go pump but subsequently she stopped doing the V-go pump because of the cost, skin irritation and local discomfort   Allergies as of 01/16/2018      Reactions   Contrast Media [iodinated Diagnostic Agents]    Throat swells from ct con tra   Adhesive [tape]    No paper tape, causes blisters   Erythromycin    REACTION: rash   Penicillins    REACTION: rash      Medication List        Accurate as of 01/16/18  9:45 AM. Always use your most recent med list.          amLODipine 5 MG tablet Commonly known as:  NORVASC Take 1 tablet (5 mg total) by mouth daily.   CALCIUM 600 1500 (600 Ca) MG Tabs tablet Generic drug:  calcium carbonate Take 2 tablets by mouth daily.   cyanocobalamin 100 MCG tablet Take 100 mcg by mouth daily.   diclofenac sodium 1 % Gel Commonly known as:  VOLTAREN Apply 4 g topically 4 (four) times daily as needed.   fluticasone 50 MCG/ACT nasal  spray Commonly known as:  FLONASE Place 2 sprays into both nostrils daily.   furosemide 40 MG tablet Commonly known as:  LASIX TAKE 2 TABLETS BY MOUTH EVERY DAY   glucosamine-chondroitin 500-400 MG tablet Take 1 tablet by mouth daily.   glucose blood test strip Please check blood sugars at least half the time about 2 hours after any meal and 3 times per day on waking up. Dx code E11.9   Insulin Pen Needle 31G X 8 MM Misc USE TO INJECT LANTUS AND NOVOLOG 4 TIMES PER DAY-DX CODE E11.9   Insulin Pen Needle 32G X 4 MM Misc USE TO INJECT INSULIN 4 TIMES DAILY   levothyroxine 137 MCG tablet Commonly known as:  SYNTHROID, LEVOTHROID TAKE ONE TABLET BY MOUTH ONCE DAILY   levothyroxine 137 MCG tablet Commonly known as:  SYNTHROID, LEVOTHROID TAKE 1 TABLET BY MOUTH ONCE DAILY   LINZESS 290 MCG Caps capsule Generic drug:  linaclotide Take 1 capsule (290 mcg total) by mouth daily.   magnesium oxide 400 MG tablet Commonly known as:  MAG-OX Take 400 mg by mouth daily.   METAMUCIL 48.57 % Powd Generic drug:  Psyllium Take by mouth.   metFORMIN 1000 MG tablet Commonly known as:  GLUCOPHAGE TAKE 1 TABLET BY MOUTH TWICE DAILY WITH MEALS   montelukast 10 MG tablet Commonly known as:  SINGULAIR TAKE 1 TABLET BY MOUTH EVERYDAY AT BEDTIME   NOVOLOG FLEXPEN 100 UNIT/ML FlexPen Generic drug:  insulin aspart INJECT UP TO 20 UNITS INTO THE SKIN BEFORE EACH MEAL   ONETOUCH DELICA LANCETS 95M Misc USE TO CHECK BLOOD SUGAR UP TO THREE TIMES A DAY 2 HOURS AFTER A MEAL AND UP TO 3 TIMES A WEEK UPON WAKING UP   oseltamivir 75 MG capsule Commonly known as:  TAMIFLU Take 1 capsule (75 mg total) by mouth every 12 (twelve) hours.   potassium chloride 10 MEQ tablet Commonly known as:  K-DUR Take 1 tablet (10 mEq total) by mouth 2 (two) times daily.   PROBIOTIC DAILY PO Take by mouth.   rosuvastatin 40 MG tablet Commonly known as:  CRESTOR TAKE 1 TABLET BY MOUTH ONCE DAILY    simvastatin 20 MG tablet Commonly known as:  ZOCOR   traMADol 50 MG tablet  Commonly known as:  ULTRAM Take 1 tablet (50 mg total) by mouth every 12 (twelve) hours as needed.   traMADol 50 MG tablet Commonly known as:  ULTRAM Take 1 tablet (50 mg total) by mouth every 6 (six) hours as needed.   TRESIBA FLEXTOUCH 200 UNIT/ML Sopn Generic drug:  Insulin Degludec INJECT 52 UNITS INTO THE SKIN EVERY MORNING.   valsartan-hydrochlorothiazide 320-12.5 MG tablet Commonly known as:  DIOVAN-HCT Take 1 tablet by mouth daily.       Allergies:  Allergies  Allergen Reactions  . Contrast Media [Iodinated Diagnostic Agents]     Throat swells from ct con tra  . Adhesive [Tape]     No paper tape, causes blisters  . Erythromycin     REACTION: rash  . Penicillins     REACTION: rash    Past Medical History:  Diagnosis Date  . Arthritis   . Bronchitis    hx of  . CHF (congestive heart failure) ([place])   . Diabetes mellitus   . GERD (gastroesophageal reflux disease)   . Hypercholesteremia   . Hypertension   . Hypothyroidism   . Pneumonia    hx of  . PONV (postoperative nausea and vomiting)   . Thyroid disease     Past Surgical History:  Procedure Laterality Date  . ABDOMINAL HYSTERECTOMY     partial  . BREAST SURGERY     lumpectomy left breast  . carpel tunnel    . COLONOSCOPY W/ POLYPECTOMY    . FOOT SURGERY    . knee arhtroscopy    . SHOULDER ARTHROSCOPY WITH SUBACROMIAL DECOMPRESSION, ROTATOR CUFF REPAIR AND BICEP TENDON REPAIR Right 05/26/2012   Procedure: Right Shoulder Diagnostic Operative Arthroscopy, Subacromial Decompression, Biceps Tenodesis, Mini Open Rotator Cuff Repair;  Surgeon: [name], MD;  Location: [facility];  Service: Orthopedics;  Laterality: Right;  Right Shoulder Diagnostic Operative Arthroscopy, Subacromial Decompression, Biceps Tenodesis, Mini Open Rotator Cuff Repair  . TONSILLECTOMY      Family History  Adopted: Yes  Problem Relation Age  of Onset  . Diabetes Mother   . Heart disease Mother   . Hyperlipidemia Mother   . Coronary artery disease Unknown     Social History:  reports that she quit smoking about 27 years ago. She has never used smokeless tobacco. She reports that she does not drink alcohol or use drugs.    Review of Systems   HYPERTENSION: This is well controlled, patient is on amlodipine 5 mg daily and Diovan HCT 160/12.5   On Lasix for edema daily and this is now well controlled with reducing amlodipine to 5 mg  BP Readings from Last 3 Encounters:  01/16/18 136/72  10/15/17 132/84  09/16/17 130/72    LIPIDS:  She is taking Crestor 40 mg, previously LDL was high with simvastatin 20 mg LDL below 100        Lab Results  Component Value Date   CHOL 144 04/11/2017   HDL 51.70 04/11/2017   LDLCALC 64 04/11/2017   LDLDIRECT 129.5 02/06/2012   TRIG 142.0 04/11/2017   CHOLHDL 3 04/11/2017                  Thyroid:     She has been hypothyroid for over 40 years and is taking a supplement of 137 g levothyroxine, 6 tablets per week  Last labs:   Lab Results  Component Value Date   TSH 1.95 10/10/2017   TSH 1.03 04/11/2017   TSH  1.13 10/08/2016   FREET4 1.05 10/10/2017   FREET4 1.13 04/11/2017   FREET4 1.03 09/26/2015       Physical Examination:  BP 136/72   Pulse 78   Wt 240 lb (108.9 kg)   SpO2 96%   BMI 39.94 kg/m      ASSESSMENT/PLAN:  DIABETES on insulin:  See history of present illness for detailed discussion of current diabetes management, blood sugar patterns and problems identified  A1c is now slightly better at 7.2, previously 7.6  Again discussed that she likely has postprandial hyperglycemia which is variable depending on her diet and whether or not she takes full doses of insulin especially at suppertime to cover the meals She is still not motivated to check blood sugars after meals Also despite discussing the importance of weight loss she does not like to  exercise   Recommendations:   She we will start walking regularly for exercise  No change in insulin  Again reminded her the importance of checking readings after meals   HYPERTENSION: Controlled Also edema are better controlled with reducing amlodipine and using higher doses of valsartan  HYPOTHYROIDISM: She will need follow-up on the next visit  Influenza vaccine given   There are no Patient Instructions on file for this visit.   Elayne Snare 01/16/2018, 9:45 AM   Note: This office note was prepared with Dragon voice recognition system technology. Any transcriptional errors that result from this process are unintentional.

## 2018-01-19 ENCOUNTER — Ambulatory Visit: Payer: Medicare Other | Admitting: Internal Medicine

## 2018-01-19 ENCOUNTER — Encounter: Payer: Self-pay | Admitting: Internal Medicine

## 2018-01-19 VITALS — BP 140/70 | HR 76 | Temp 97.8°F | Ht 65.0 in | Wt 240.0 lb

## 2018-01-19 DIAGNOSIS — R3989 Other symptoms and signs involving the genitourinary system: Secondary | ICD-10-CM | POA: Diagnosis not present

## 2018-01-19 LAB — POCT URINALYSIS DIPSTICK
BILIRUBIN UA: NEGATIVE
Glucose, UA: NEGATIVE
Ketones, UA: NEGATIVE
LEUKOCYTES UA: NEGATIVE
NITRITE UA: NEGATIVE
PH UA: 6 (ref 5.0–8.0)
Protein, UA: POSITIVE — AB
RBC UA: NEGATIVE
Spec Grav, UA: 1.015 (ref 1.010–1.025)
UROBILINOGEN UA: 0.2 U/dL

## 2018-01-19 NOTE — Progress Notes (Signed)
   Subjective:    Patient ID: Marissa Roberts, female    DOB: March 19, 1946, 72 y.o.   MRN: [id number]  HPI The patient is a 72 YO female coming in for dark urine. Denies pain when voiding but some discomfort when done urinating. Denies back pain or nausea. She is drinking increased water. Does have some strong odor to urine. Denies fevers or chills. Does have diabetes but is well controlled.   Review of Systems  Constitutional: Negative.   Respiratory: Negative.   Cardiovascular: Negative.   Gastrointestinal: Negative for abdominal distention, abdominal pain, constipation, diarrhea, nausea and vomiting.  Genitourinary: Negative for dysuria, frequency and urgency.       Dark urine color  Musculoskeletal: Negative.   Skin: Negative.       Objective:   Physical Exam  Constitutional: She is oriented to person, place, and time. She appears well-developed and well-nourished.  HENT:  Head: Normocephalic and atraumatic.  Eyes: EOM are normal.  Neck: Normal range of motion.  Cardiovascular: Normal rate and regular rhythm.  Pulmonary/Chest: Effort normal and breath sounds normal. No respiratory distress. She has no wheezes. She has no rales.  Abdominal: Soft. Bowel sounds are normal. She exhibits no distension. There is no tenderness. There is no rebound.  Musculoskeletal: She exhibits no edema.  Mild lumbar tenderness midline  Neurological: She is alert and oriented to person, place, and time.  Skin: Skin is warm and dry.  Psychiatric: She has a normal mood and affect.   Vitals:   01/19/18 1305  BP: 140/70  Pulse: 76  Temp: 97.8 F (36.6 C)  TempSrc: Oral  SpO2: 96%  Weight: 240 lb (108.9 kg)  Height: 5\' 5"  (1.651 m)      Assessment & Plan:

## 2018-01-19 NOTE — Patient Instructions (Signed)
There is not an infection in the urine or the kidneys.  Keep drinking plenty of fluids to help as well.

## 2018-01-20 DIAGNOSIS — R3989 Other symptoms and signs involving the genitourinary system: Secondary | ICD-10-CM | POA: Insufficient documentation

## 2018-01-20 NOTE — Assessment & Plan Note (Signed)
U/A done at visit, no signs of infection. Advised to drink plenty of fluids to help.

## 2018-02-03 ENCOUNTER — Other Ambulatory Visit: Payer: Self-pay | Admitting: Endocrinology

## 2018-02-23 ENCOUNTER — Other Ambulatory Visit: Payer: Self-pay | Admitting: Endocrinology

## 2018-03-10 ENCOUNTER — Ambulatory Visit (INDEPENDENT_AMBULATORY_CARE_PROVIDER_SITE_OTHER): Payer: Medicare Other | Admitting: Podiatry

## 2018-03-10 DIAGNOSIS — E119 Type 2 diabetes mellitus without complications: Secondary | ICD-10-CM

## 2018-03-10 DIAGNOSIS — M79674 Pain in right toe(s): Secondary | ICD-10-CM

## 2018-03-10 DIAGNOSIS — B351 Tinea unguium: Secondary | ICD-10-CM

## 2018-03-10 DIAGNOSIS — L84 Corns and callosities: Secondary | ICD-10-CM

## 2018-03-10 DIAGNOSIS — M79675 Pain in left toe(s): Secondary | ICD-10-CM

## 2018-03-10 DIAGNOSIS — Z794 Long term (current) use of insulin: Secondary | ICD-10-CM

## 2018-03-10 NOTE — Patient Instructions (Signed)

## 2018-03-11 ENCOUNTER — Encounter: Payer: Self-pay | Admitting: Podiatry

## 2018-03-11 NOTE — Progress Notes (Signed)
Subjective: Marissa Roberts presents today for diabetic footcare with painful, thick toenails 1-5 b/l that she cannot cut and which interfere with daily activities.  Pain is aggravated when wearing enclosed shoe gear.  Patient also has painful corns/calluses which pose a risk due to diabetes. Today, she is complaining of pain of her left 5th digit when wearing shoe gear. She has not noticed any redness, drainage or swelling.  Patient states she dropped a package of meat on her left foot on Thanksgiving. She denies any brusing/swelling. She still has some discomfort.   Current Outpatient Medications:  .  amLODipine (NORVASC) 5 MG tablet, Take 1 tablet (5 mg total) by mouth daily., Disp: 90 tablet, Rfl: 3 .  Calcium Carbonate (CALCIUM 600) 1500 MG TABS, Take 2 tablets by mouth daily. , Disp: , Rfl:  .  cyanocobalamin 100 MCG tablet, Take 100 mcg by mouth daily., Disp: , Rfl:  .  diclofenac sodium (VOLTAREN) 1 % GEL, Apply 4 g topically 4 (four) times daily as needed., Disp: 500 g, Rfl: 6 .  fluticasone (FLONASE) 50 MCG/ACT nasal spray, Place 2 sprays into both nostrils daily., Disp: 48 g, Rfl: 1 .  furosemide (LASIX) 40 MG tablet, TAKE 2 TABLETS BY MOUTH EVERY DAY, Disp: 180 tablet, Rfl: 1 .  glucosamine-chondroitin 500-400 MG tablet, Take 1 tablet by mouth daily.  , Disp: , Rfl:  .  glucose blood (ONETOUCH VERIO) test strip, Please check blood sugars at least half the time about 2 hours after any meal and 3 times per day on waking up. Dx code E11.9, Disp: 270 each, Rfl: 2 .  Insulin Degludec (TRESIBA FLEXTOUCH) 200 UNIT/ML SOPN, Inject 54 Units into the skin every morning., Disp: 9 pen, Rfl: 3 .  Insulin Pen Needle (BD PEN NEEDLE NANO U/F) 32G X 4 MM MISC, USE TO INJECT INSULIN 4 TIMES DAILY, Disp: 200 each, Rfl: 1 .  Insulin Pen Needle (EASY TOUCH PEN NEEDLES) 31G X 8 MM MISC, USE TO INJECT LANTUS AND NOVOLOG 4 TIMES PER DAY-DX CODE E11.9, Disp: 400 each, Rfl: 2 .  levothyroxine (SYNTHROID,  LEVOTHROID) 137 MCG tablet, TAKE ONE TABLET BY MOUTH ONCE DAILY, Disp: 90 tablet, Rfl: 3 .  levothyroxine (SYNTHROID, LEVOTHROID) 137 MCG tablet, TAKE 1 TABLET BY MOUTH ONCE DAILY, Disp: 90 tablet, Rfl: 1 .  LINZESS 290 MCG CAPS capsule, Take 1 capsule (290 mcg total) by mouth daily. (Patient taking differently: Take 290 mcg by mouth daily. ), Disp: 90 capsule, Rfl: 3 .  magnesium oxide (MAG-OX) 400 MG tablet, Take 400 mg by mouth daily., Disp: , Rfl:  .  metFORMIN (GLUCOPHAGE) 1000 MG tablet, TAKE 1 TABLET BY MOUTH TWICE DAILY WITH MEALS, Disp: 180 tablet, Rfl: 0 .  montelukast (SINGULAIR) 10 MG tablet, TAKE 1 TABLET BY MOUTH EVERYDAY AT BEDTIME, Disp: 30 tablet, Rfl: 2 .  NOVOLOG FLEXPEN 100 UNIT/ML FlexPen, INJECT UP TO 20 UNITS INTO THE SKIN BEFORE EACH MEAL, Disp: 15 pen, Rfl: 1 .  ONETOUCH DELICA LANCETS 47S MISC, USE TO CHECK BLOOD SUGAR UP TO THREE TIMES A DAY 2 HOURS AFTER A MEAL AND UP TO 3 TIMES A WEEK UPON WAKING UP, Disp: 300 each, Rfl: 2 .  potassium chloride (K-DUR) 10 MEQ tablet, TAKE 1 TABLET BY MOUTH TWICE DAILY, Disp: 180 tablet, Rfl: 3 .  Probiotic Product (PROBIOTIC DAILY PO), Take by mouth., Disp: , Rfl:  .  Psyllium (METAMUCIL) 48.57 % POWD, Take by mouth., Disp: , Rfl:  .  rosuvastatin (  CRESTOR) 40 MG tablet, TAKE 1 TABLET BY MOUTH ONCE DAILY, Disp: 90 tablet, Rfl: 3 .  valsartan-hydrochlorothiazide (DIOVAN-HCT) 320-12.5 MG tablet, Take 1 tablet by mouth daily., Disp: 90 tablet, Rfl: 3  Allergies  Allergen Reactions  . Contrast Media [Iodinated Diagnostic Agents]     Throat swells from ct con tra  . Adhesive [Tape]     No paper tape, causes blisters  . Erythromycin     REACTION: rash  . Penicillins     REACTION: rash    Objective:  Vascular Examination: Capillary refill time immediate x 10 digits Dorsalis pedis and Posterior tibial pulses palpable b/l Digital hair present x 10 digits Skin temperature gradient WNL b/l  Dermatological Examination: Skin with  normal turgor, texture and tone b/l  Toenails 1-5 b/l discolored, thick, dystrophic with subungual debris and pain with palpation to nailbeds due to thickness of nails.  Hyperkeratotic lesion plantarmedial hallux IPJ b/l dorsal 5th PIPJ b/l. No edema, no erythema, no flocculence, no drainage.  Dorsum left foot with no ecchymosis or edema. Good ROM to all digits.   Musculoskeletal: Muscle strength 5/5 to all LE muscle groups  No antalgic gait left foot.  Neurological: Sensation intact with 10 gram monofilament. Vibratory sensation intact.  Assessment: 1. Painful onychomycosis toenails 1-5 b/l 2. Callus b/l hallux 3. Corns b/l 5th digits 4. NIDDM   Plan: 1. Toenails 1-5 b/l were debrided in length and girth without iatrogenic bleeding. 2. Corns/calluses pared without incident b/l hallux, b/l 5th digits 3. Ms. Gillham did experience an episode of dropping something on her foot, but there are no issues discovered on today's visit. 4. Patient to continue soft, supportive shoe gear 5. Patient to report any pedal injuries to medical professional immediately. 6. Follow up 3 months. Patient/POA to call should there be a concern in the interim.

## 2018-03-18 ENCOUNTER — Ambulatory Visit (INDEPENDENT_AMBULATORY_CARE_PROVIDER_SITE_OTHER): Payer: Medicare Other | Admitting: Internal Medicine

## 2018-03-18 ENCOUNTER — Other Ambulatory Visit: Payer: Self-pay | Admitting: Internal Medicine

## 2018-03-18 ENCOUNTER — Encounter: Payer: Self-pay | Admitting: Internal Medicine

## 2018-03-18 ENCOUNTER — Other Ambulatory Visit (INDEPENDENT_AMBULATORY_CARE_PROVIDER_SITE_OTHER): Payer: Medicare Other

## 2018-03-18 VITALS — BP 136/80 | HR 72 | Temp 98.0°F | Ht 65.0 in | Wt 239.0 lb

## 2018-03-18 DIAGNOSIS — I10 Essential (primary) hypertension: Secondary | ICD-10-CM

## 2018-03-18 DIAGNOSIS — Z0000 Encounter for general adult medical examination without abnormal findings: Secondary | ICD-10-CM | POA: Diagnosis not present

## 2018-03-18 DIAGNOSIS — E785 Hyperlipidemia, unspecified: Secondary | ICD-10-CM

## 2018-03-18 DIAGNOSIS — E039 Hypothyroidism, unspecified: Secondary | ICD-10-CM | POA: Diagnosis not present

## 2018-03-18 DIAGNOSIS — E118 Type 2 diabetes mellitus with unspecified complications: Secondary | ICD-10-CM

## 2018-03-18 DIAGNOSIS — E1169 Type 2 diabetes mellitus with other specified complication: Secondary | ICD-10-CM

## 2018-03-18 LAB — CBC
HCT: 34.4 % — ABNORMAL LOW (ref 36.0–46.0)
Hemoglobin: 11.1 g/dL — ABNORMAL LOW (ref 12.0–15.0)
MCHC: 32.3 g/dL (ref 30.0–36.0)
MCV: 75.7 fl — ABNORMAL LOW (ref 78.0–100.0)
Platelets: 321 10*3/uL (ref 150.0–400.0)
RBC: 4.55 Mil/uL (ref 3.87–5.11)
RDW: 17.3 % — ABNORMAL HIGH (ref 11.5–15.5)
WBC: 11.6 10*3/uL — ABNORMAL HIGH (ref 4.0–10.5)

## 2018-03-18 LAB — LIPID PANEL
CHOLESTEROL: 146 mg/dL (ref 0–200)
HDL: 53.8 mg/dL (ref >39.00)
LDL Cholesterol: 72 mg/dL (ref 0–99)
NonHDL: 91.89
TRIGLYCERIDES: 100 mg/dL (ref 0.0–149.0)
Total CHOL/HDL Ratio: 3
VLDL: 20 mg/dL (ref 0.0–40.0)

## 2018-03-18 MED ORDER — LINZESS 290 MCG PO CAPS: 290.0000 ug | ORAL_CAPSULE | Freq: Every day | ORAL | 1 refills | Status: DC

## 2018-03-18 MED ORDER — FUROSEMIDE 40 MG PO TABS: 80.0000 mg | ORAL_TABLET | Freq: Every day | ORAL | 1 refills | Status: DC

## 2018-03-18 MED FILL — LINZESS 290 MCG CAPSULE: 290 | 90 days supply | Qty: 90 | Fill #0

## 2018-03-18 MED FILL — FUROSEMIDE 40 MG TAB: 40 | 90 days supply | Qty: 180 | Fill #0

## 2018-03-18 NOTE — Patient Instructions (Signed)
I would recommend to start taking the zyrtec and flonase and singulair to help avoid bronchitis.   Health Maintenance, Female Adopting a healthy lifestyle and getting preventive care can go a long way to promote health and wellness. Talk with your health care provider about what schedule of regular examinations is right for you. This is a good chance for you to check in with your provider about disease prevention and staying healthy. In between checkups, there are plenty of things you can do on your own. Experts have done a lot of research about which lifestyle changes and preventive measures are most likely to keep you healthy. Ask your health care provider for more information. Weight and diet Eat a healthy diet  Be sure to include plenty of vegetables, fruits, low-fat dairy products, and lean protein.  Do not eat a lot of foods high in solid fats, added sugars, or salt.  Get regular exercise. This is one of the most important things you can do for your health. ? Most adults should exercise for at least 150 minutes each week. The exercise should increase your heart rate and make you sweat (moderate-intensity exercise). ? Most adults should also do strengthening exercises at least twice a week. This is in addition to the moderate-intensity exercise. Maintain a healthy weight  Body mass index (BMI) is a measurement that can be used to identify possible weight problems. It estimates body fat based on height and weight. Your health care provider can help determine your BMI and help you achieve or maintain a healthy weight.  For females 35 years of age and older: ? A BMI below 18.5 is considered underweight. ? A BMI of 18.5 to 24.9 is normal. ? A BMI of 25 to 29.9 is considered overweight. ? A BMI of 30 and above is considered obese. Watch levels of cholesterol and blood lipids  You should start having your blood tested for lipids and cholesterol at 70 years of age, then have this test every 5  years.  You may need to have your cholesterol levels checked more often if: ? Your lipid or cholesterol levels are high. ? You are older than 70 years of age. ? You are at high risk for heart disease. Cancer screening Lung Cancer  Lung cancer screening is recommended for adults 40-86 years old who are at high risk for lung cancer because of a history of smoking.  A yearly low-dose CT scan of the lungs is recommended for people who: ? Currently smoke. ? Have quit within the past 15 years. ? Have at least a 30-pack-year history of smoking. A pack year is smoking an average of one pack of cigarettes a day for 1 year.  Yearly screening should continue until it has been 15 years since you quit.  Yearly screening should stop if you develop a health problem that would prevent you from having lung cancer treatment. Breast Cancer  Practice breast self-awareness. This means understanding how your breasts normally appear and feel.  It also means doing regular breast self-exams. Let your health care provider know about any changes, no matter how small.  If you are in your 20s or 30s, you should have a clinical breast exam (CBE) by a health care provider every 1-3 years as part of a regular health exam.  If you are 3 or older, have a CBE every year. Also consider having a breast X-ray (mammogram) every year.  If you have a family history of breast cancer, talk to  your health care provider about genetic screening.  If you are at high risk for breast cancer, talk to your health care provider about having an MRI and a mammogram every year.  Breast cancer gene (BRCA) assessment is recommended for women who have family members with BRCA-related cancers. BRCA-related cancers include: ? Breast. ? Ovarian. ? Tubal. ? Peritoneal cancers.  Results of the assessment will determine the need for genetic counseling and BRCA1 and BRCA2 testing. Cervical Cancer Your health care provider may recommend  that you be screened regularly for cancer of the pelvic organs (ovaries, uterus, and vagina). This screening involves a pelvic examination, including checking for microscopic changes to the surface of your cervix (Pap test). You may be encouraged to have this screening done every 3 years, beginning at age 44.  For women ages 11-65, health care providers may recommend pelvic exams and Pap testing every 3 years, or they may recommend the Pap and pelvic exam, combined with testing for human papilloma virus (HPV), every 5 years. Some types of HPV increase your risk of cervical cancer. Testing for HPV may also be done on women of any age with unclear Pap test results.  Other health care providers may not recommend any screening for nonpregnant women who are considered low risk for pelvic cancer and who do not have symptoms. Ask your health care provider if a screening pelvic exam is right for you.  If you have had past treatment for cervical cancer or a condition that could lead to cancer, you need Pap tests and screening for cancer for at least 20 years after your treatment. If Pap tests have been discontinued, your risk factors (such as having a new sexual partner) need to be reassessed to determine if screening should resume. Some women have medical problems that increase the chance of getting cervical cancer. In these cases, your health care provider may recommend more frequent screening and Pap tests. Colorectal Cancer  This type of cancer can be detected and often prevented.  Routine colorectal cancer screening usually begins at 70 years of age and continues through 70 years of age.  Your health care provider may recommend screening at an earlier age if you have risk factors for colon cancer.  Your health care provider may also recommend using home test kits to check for hidden blood in the stool.  A small camera at the end of a tube can be used to examine your colon directly (sigmoidoscopy or  colonoscopy). This is done to check for the earliest forms of colorectal cancer.  Routine screening usually begins at age 60.  Direct examination of the colon should be repeated every 5-10 years through 70 years of age. However, you may need to be screened more often if early forms of precancerous polyps or small growths are found. Skin Cancer  Check your skin from head to toe regularly.  Tell your health care provider about any new moles or changes in moles, especially if there is a change in a mole's shape or color.  Also tell your health care provider if you have a mole that is larger than the size of a pencil eraser.  Always use sunscreen. Apply sunscreen liberally and repeatedly throughout the day.  Protect yourself by wearing long sleeves, pants, a wide-brimmed hat, and sunglasses whenever you are outside. Heart disease, diabetes, and high blood pressure  High blood pressure causes heart disease and increases the risk of stroke. High blood pressure is more likely to develop in: ?  People who have blood pressure in the high end of the normal range (130-139/85-89 mm Hg). ? People who are overweight or obese. ? People who are African American.  If you are 60-77 years of age, have your blood pressure checked every 3-5 years. If you are 21 years of age or older, have your blood pressure checked every year. You should have your blood pressure measured twice-once when you are at a hospital or clinic, and once when you are not at a hospital or clinic. Record the average of the two measurements. To check your blood pressure when you are not at a hospital or clinic, you can use: ? An automated blood pressure machine at a pharmacy. ? A home blood pressure monitor.  If you are between 43 years and 84 years old, ask your health care provider if you should take aspirin to prevent strokes.  Have regular diabetes screenings. This involves taking a blood sample to check your fasting blood sugar  level. ? If you are at a normal weight and have a low risk for diabetes, have this test once every three years after 70 years of age. ? If you are overweight and have a high risk for diabetes, consider being tested at a younger age or more often. Preventing infection Hepatitis B  If you have a higher risk for hepatitis B, you should be screened for this virus. You are considered at high risk for hepatitis B if: ? You were born in a country where hepatitis B is common. Ask your health care provider which countries are considered high risk. ? Your parents were born in a high-risk country, and you have not been immunized against hepatitis B (hepatitis B vaccine). ? You have HIV or AIDS. ? You use needles to inject street drugs. ? You live with someone who has hepatitis B. ? You have had sex with someone who has hepatitis B. ? You get hemodialysis treatment. ? You take certain medicines for conditions, including cancer, organ transplantation, and autoimmune conditions. Hepatitis C  Blood testing is recommended for: ? Everyone born from [birth year] through 1963. ? Anyone with known risk factors for hepatitis C. Sexually transmitted infections (STIs)  You should be screened for sexually transmitted infections (STIs) including gonorrhea and chlamydia if: ? You are sexually active and are younger than 70 years of age. ? You are older than 70 years of age and your health care provider tells you that you are at risk for this type of infection. ? Your sexual activity has changed since you were last screened and you are at an increased risk for chlamydia or gonorrhea. Ask your health care provider if you are at risk.  If you do not have HIV, but are at risk, it may be recommended that you take a prescription medicine daily to prevent HIV infection. This is called pre-exposure prophylaxis (PrEP). You are considered at risk if: ? You are sexually active and do not regularly use condoms or know the HIV status  of your partner(s). ? You take drugs by injection. ? You are sexually active with a partner who has HIV. Talk with your health care provider about whether you are at high risk of being infected with HIV. If you choose to begin PrEP, you should first be tested for HIV. You should then be tested every 3 months for as long as you are taking PrEP. Pregnancy  If you are premenopausal and you may become pregnant, ask your health care provider about  preconception counseling.  If you may become pregnant, take 400 to 800 micrograms (mcg) of folic acid every day.  If you want to prevent pregnancy, talk to your health care provider about birth control (contraception). Osteoporosis and menopause  Osteoporosis is a disease in which the bones lose minerals and strength with aging. This can result in serious bone fractures. Your risk for osteoporosis can be identified using a bone density scan.  If you are 45 years of age or older, or if you are at risk for osteoporosis and fractures, ask your health care provider if you should be screened.  Ask your health care provider whether you should take a calcium or vitamin D supplement to lower your risk for osteoporosis.  Menopause may have certain physical symptoms and risks.  Hormone replacement therapy may reduce some of these symptoms and risks. Talk to your health care provider about whether hormone replacement therapy is right for you. Follow these instructions at home:  Schedule regular health, dental, and eye exams.  Stay current with your immunizations.  Do not use any tobacco products including cigarettes, chewing tobacco, or electronic cigarettes.  If you are pregnant, do not drink alcohol.  If you are breastfeeding, limit how much and how often you drink alcohol.  Limit alcohol intake to no more than 1 drink per day for nonpregnant women. One drink equals 12 ounces of beer, 5 ounces of wine, or 1 ounces of hard liquor.  Do not use street  drugs.  Do not share needles.  Ask your health care provider for help if you need support or information about quitting drugs.  Tell your health care provider if you often feel depressed.  Tell your health care provider if you have ever been abused or do not feel safe at home. This information is not intended to replace advice given to you by your health care provider. Make sure you discuss any questions you have with your health care provider. Document Released: 10/01/2010 Document Revised: 08/24/2015 Document Reviewed: 12/20/2014 Elsevier Interactive Patient Education  2019 [employer].

## 2018-03-18 NOTE — Progress Notes (Signed)
   Subjective:    Patient ID: Marissa Roberts, female    DOB: 1946/01/21, 72 y.o.   MRN: [id number]  HPI The patient is a 72 YO female coming in for physical.   PMH, [facility], social history reviewed and updated.   Review of Systems  Constitutional: Negative.   HENT: Negative.   Eyes: Negative.   Respiratory: Negative for cough, chest tightness and shortness of breath.   Cardiovascular: Negative for chest pain, palpitations and leg swelling.  Gastrointestinal: Negative for abdominal distention, abdominal pain, constipation, diarrhea, nausea and vomiting.  Musculoskeletal: Negative.   Skin: Negative.   Neurological: Negative.   Psychiatric/Behavioral: Negative.       Objective:   Physical Exam Constitutional:      Appearance: She is well-developed.  HENT:     Head: Normocephalic and atraumatic.  Neck:     Musculoskeletal: Normal range of motion.  Cardiovascular:     Rate and Rhythm: Normal rate and regular rhythm.  Pulmonary:     Effort: Pulmonary effort is normal. No respiratory distress.     Breath sounds: Normal breath sounds. No wheezing or rales.  Abdominal:     General: Bowel sounds are normal. There is no distension.     Palpations: Abdomen is soft.     Tenderness: There is no abdominal tenderness. There is no rebound.  Skin:    General: Skin is warm and dry.  Neurological:     Mental Status: She is alert and oriented to person, place, and time.     Coordination: Coordination normal.    Vitals:   03/18/18 0855  BP: 136/80  Pulse: 72  Temp: 98 F (36.7 C)  TempSrc: Oral  SpO2: 96%  Weight: 239 lb (108.4 kg)  Height: 5\' 5"  (1.651 m)      Assessment & Plan:

## 2018-03-18 NOTE — Telephone Encounter (Signed)
Requested medication (s) are due for refill today -yes  Requested medication (s) are on the active medication list -yes  Future visit scheduled - yes  Last refill: furosemide filled 5 months ago and Linzess is out dated RX from 2017  Notes to clinic: Patient was seen at 9 am today- note not finished- although all green on furosemide and Linzess- would like to give provider opportunity to chart continuation of medications. Sent for provider review.  Requested Prescriptions  Pending Prescriptions Disp Refills   furosemide (LASIX) 40 MG tablet 180 tablet 1    Sig: Take 2 tablets (80 mg total) by mouth daily.     Cardiovascular:  Diuretics - Loop Passed - 03/18/2018  2:48 PM      Passed - K in normal range and within 360 days    Potassium  Date Value Ref Range Status  01/12/2018 3.9 3.5 - 5.1 mEq/L Final         Passed - Ca in normal range and within 360 days    Calcium  Date Value Ref Range Status  01/12/2018 9.5 8.4 - 10.5 mg/dL Final         Passed - Na in normal range and within 360 days    Sodium  Date Value Ref Range Status  01/12/2018 141 135 - 145 mEq/L Final         Passed - Cr in normal range and within 360 days    Creatinine, Ser  Date Value Ref Range Status  01/12/2018 0.72 0.40 - 1.20 mg/dL Final         Passed - Last BP in normal range    BP Readings from Last 1 Encounters:  03/18/18 136/80         Passed - Valid encounter within last 6 months    Recent Outpatient Visits          Today Hyperlipidemia associated with type 2 diabetes mellitus ([place])   [place], Elizabeth A, MD   1 month ago Abnormal urine color   [place], Elizabeth A, MD   6 months ago Diabetes mellitus type 2, uncontrolled, with complications [facility])   [place], Elizabeth A, MD   1 year ago Routine general medical examination at a health care facility   [place], Elizabeth A, MD   1 year ago Viral URI   [place], MD      Future Appointments            In 6 months [name], MD [facility]          LINZESS 290 MCG CAPS capsule 90 capsule 3    Sig: Take 1 capsule (290 mcg total) by mouth daily.     Gastroenterology: Irritable Bowel Syndrome Passed - 03/18/2018  2:48 PM      Passed - Valid encounter within last 12 months    Recent Outpatient Visits          Today Hyperlipidemia associated with type 2 diabetes mellitus ([place])   [place], Elizabeth A, MD   1 month ago Abnormal urine color   [place], MD   6 months ago Diabetes mellitus type 2, uncontrolled, with complications [facility])   [facility], MD   1  year ago Routine general medical examination at a health care facility   [place], Elizabeth A, MD   1 year ago Viral URI   [place], MD      Future Appointments            In 6 months [name], MD [facility], [facility]            Requested Prescriptions  Pending Prescriptions Disp Refills   furosemide (LASIX) 40 MG tablet 180 tablet 1    Sig: Take 2 tablets (80 mg total) by mouth daily.     Cardiovascular:  Diuretics - Loop Passed - 03/18/2018  2:48 PM      Passed - K in normal range and within 360 days    Potassium  Date Value Ref Range Status  01/12/2018 3.9 3.5 - 5.1 mEq/L Final         Passed - Ca in normal range and within 360 days    Calcium  Date Value Ref Range Status  01/12/2018 9.5 8.4 - 10.5 mg/dL Final         Passed - Na in normal range and within 360 days    Sodium  Date Value Ref Range Status  01/12/2018 141 135 - 145 mEq/L Final          Passed - Cr in normal range and within 360 days    Creatinine, Ser  Date Value Ref Range Status  01/12/2018 0.72 0.40 - 1.20 mg/dL Final         Passed - Last BP in normal range    BP Readings from Last 1 Encounters:  03/18/18 136/80         Passed - Valid encounter within last 6 months    Recent Outpatient Visits          Today Hyperlipidemia associated with type 2 diabetes mellitus ([place])   [place], Elizabeth A, MD   1 month ago Abnormal urine color   [place], Elizabeth A, MD   6 months ago Diabetes mellitus type 2, uncontrolled, with complications [facility])   [place], Elizabeth A, MD   1 year ago Routine general medical examination at a health care facility   [place], Elizabeth A, MD   1 year ago Viral URI   [place], MD      Future Appointments            In 6 months [name], MD [facility]          LINZESS 290 MCG CAPS capsule 90 capsule 3    Sig: Take 1 capsule (290 mcg total) by mouth daily.     Gastroenterology: Irritable Bowel Syndrome Passed - 03/18/2018  2:48 PM      Passed - Valid encounter within last 12 months    Recent Outpatient Visits          Today Hyperlipidemia associated with type 2 diabetes mellitus ([place])   [place], Elizabeth A, MD   1 month ago Abnormal urine color   [place], MD   6 months ago Diabetes mellitus type 2, uncontrolled, with complications [facility])   [facility] Hint, MD  1 year ago Routine general medical examination at a health care facility   [place], Elizabeth A, MD   1 year ago Viral URI   [place], Enid Baas, MD      Future Appointments            In 6 months [place] Real [place], MD [place], [facility]

## 2018-03-18 NOTE — Telephone Encounter (Signed)
Copied from [place] [id number]. Topic: Quick Communication - Rx Refill/Question >> Mar 18, 2018  2:42 PM Waldemar Dickens, Sade R wrote: Medication: furosemide (LASIX) 40 MG tablet , LINZESS 290 MCG CAPS capsule  Has the patient contacted their pharmacy? Yes Preferred Pharmacy (with phone number or street name): [place], [place]. [phone] (Phone) [phone] (Fax)    Agent: Please be advised that RX refills may take up to 3 business days. We ask that you follow-up with your pharmacy.

## 2018-03-19 NOTE — Assessment & Plan Note (Signed)
Seeing endo for management and HgA1c at goal recently and she is committed to maintaining control.

## 2018-03-19 NOTE — Assessment & Plan Note (Signed)
Seeing endo and they are checking levels. Taking levothyroxine 137 mcg daily.

## 2018-03-19 NOTE — Assessment & Plan Note (Signed)
BP at goal on amlodipine and valsartan/hctz and lasix. Checking CMP and adjust as needed.

## 2018-03-19 NOTE — Assessment & Plan Note (Signed)
Flu shot up to date. Pneumonia up to date. Shingrix counseled. Tetanus up to date. Colonoscopy up to date. Mammogram up to date, pap smear aged out and dexa up to date. Counseled about sun safety and mole surveillance. Counseled about the dangers of distracted driving. Given 10 year screening recommendations.

## 2018-03-19 NOTE — Assessment & Plan Note (Signed)
Checking lipid panel and adjust as needed. Taking crestor 40 mg daily.

## 2018-03-19 NOTE — Assessment & Plan Note (Signed)
BMI 39 but complicated by diabetes, hyperlipidemia and hypertension.

## 2018-03-26 ENCOUNTER — Other Ambulatory Visit: Payer: Self-pay | Admitting: Endocrinology

## 2018-04-10 ENCOUNTER — Ambulatory Visit: Payer: Medicare Other | Admitting: Podiatry

## 2018-04-13 DIAGNOSIS — M8580 Other specified disorders of bone density and structure, unspecified site: Secondary | ICD-10-CM | POA: Diagnosis not present

## 2018-04-13 DIAGNOSIS — Z01419 Encounter for gynecological examination (general) (routine) without abnormal findings: Secondary | ICD-10-CM | POA: Diagnosis not present

## 2018-04-13 DIAGNOSIS — Z1231 Encounter for screening mammogram for malignant neoplasm of breast: Secondary | ICD-10-CM | POA: Diagnosis not present

## 2018-04-13 DIAGNOSIS — Z6841 Body Mass Index (BMI) 40.0 and over, adult: Secondary | ICD-10-CM | POA: Diagnosis not present

## 2018-04-13 DIAGNOSIS — Z1272 Encounter for screening for malignant neoplasm of vagina: Secondary | ICD-10-CM | POA: Diagnosis not present

## 2018-04-14 ENCOUNTER — Telehealth: Payer: Self-pay | Admitting: Endocrinology

## 2018-04-14 ENCOUNTER — Other Ambulatory Visit: Payer: Self-pay | Admitting: Internal Medicine

## 2018-04-14 NOTE — Telephone Encounter (Signed)
Called pt and left message with husband to return call when she is available.

## 2018-04-14 NOTE — Telephone Encounter (Signed)
patient stated that her OBGYN is wanting her to have vitamin d labs done and she would like to know if this is something she can have done with our lab tech,   Please advise

## 2018-04-14 NOTE — Telephone Encounter (Signed)
Pt called back and gave her the message that if her OBGYN puts the order in her chart, the lab tech at this office can draw the lab.

## 2018-04-17 ENCOUNTER — Other Ambulatory Visit (INDEPENDENT_AMBULATORY_CARE_PROVIDER_SITE_OTHER): Payer: Medicare Other

## 2018-04-17 DIAGNOSIS — Z794 Long term (current) use of insulin: Secondary | ICD-10-CM

## 2018-04-17 DIAGNOSIS — E1169 Type 2 diabetes mellitus with other specified complication: Secondary | ICD-10-CM

## 2018-04-17 DIAGNOSIS — E785 Hyperlipidemia, unspecified: Secondary | ICD-10-CM | POA: Diagnosis not present

## 2018-04-17 DIAGNOSIS — E1165 Type 2 diabetes mellitus with hyperglycemia: Secondary | ICD-10-CM | POA: Diagnosis not present

## 2018-04-17 DIAGNOSIS — M810 Age-related osteoporosis without current pathological fracture: Secondary | ICD-10-CM | POA: Diagnosis not present

## 2018-04-17 LAB — LIPID PANEL
CHOL/HDL RATIO: 3
Cholesterol: 157 mg/dL (ref 0–200)
HDL: 51 mg/dL (ref >39.00)
LDL Cholesterol: 77 mg/dL (ref 0–99)
NONHDL: 105.66
Triglycerides: 145 mg/dL (ref 0.0–149.0)
VLDL: 29 mg/dL (ref 0.0–40.0)

## 2018-04-17 LAB — COMPREHENSIVE METABOLIC PANEL
ALT: 15 U/L (ref 0–35)
AST: 16 U/L (ref 0–37)
Albumin: 4 g/dL (ref 3.5–5.2)
Alkaline Phosphatase: 47 U/L (ref 39–117)
BUN: 15 mg/dL (ref 6–23)
CHLORIDE: 101 meq/L (ref 96–112)
CO2: 30 mEq/L (ref 19–32)
Calcium: 10.3 mg/dL (ref 8.4–10.5)
Creatinine, Ser: 0.81 mg/dL (ref 0.40–1.20)
GFR: 84.43 mL/min (ref >60.00)
Glucose, Bld: 132 mg/dL — ABNORMAL HIGH (ref 70–99)
Potassium: 3.9 mEq/L (ref 3.5–5.1)
Sodium: 139 mEq/L (ref 135–145)
Total Bilirubin: 0.3 mg/dL (ref 0.2–1.2)
Total Protein: 7.2 g/dL (ref 6.0–8.3)

## 2018-04-17 LAB — HEMOGLOBIN A1C: Hgb A1c MFr Bld: 7.4 % — ABNORMAL HIGH (ref 4.6–6.5)

## 2018-04-21 ENCOUNTER — Ambulatory Visit: Payer: Medicare Other | Admitting: Endocrinology

## 2018-04-21 ENCOUNTER — Encounter: Payer: Self-pay | Admitting: Endocrinology

## 2018-04-21 VITALS — BP 150/74 | HR 78 | Ht 65.0 in | Wt 240.4 lb

## 2018-04-21 DIAGNOSIS — E1165 Type 2 diabetes mellitus with hyperglycemia: Secondary | ICD-10-CM | POA: Diagnosis not present

## 2018-04-21 DIAGNOSIS — E039 Hypothyroidism, unspecified: Secondary | ICD-10-CM

## 2018-04-21 DIAGNOSIS — Z794 Long term (current) use of insulin: Secondary | ICD-10-CM | POA: Diagnosis not present

## 2018-04-21 NOTE — Patient Instructions (Addendum)
Check blood sugars on waking up 3 days a week  Also check blood sugars about 2-3 hours after meals and do this after different meals by rotation  Recommended blood sugar levels on waking up are 90-130 and about 2 hours after meal is 130-160  Please bring your blood sugar monitor to each visit, thank you   Take 1 pill of Lasix   INDOOR EXERCISE IDEAS   Use the following examples for a creative indoor workout (perform each move for 2-3 minutes):   Warm up. Put on some music that makes you feel like moving, and dance around the living room.  Watch exercise shows on TV and move along with them. There are tons of free cable channels that have daily exercise shows on them for all levels - beginner through advanced.   You can easily find a number of exercise videos but use one that will suit your liking and exercise level; you can do these on your own schedule.  Walk up and down the steps.  Do dumbbell curls and presses (if you don't have weights, use full water bottles).  Do assisted squats, keeping your back on a fitness ball against the wall or using the back of the couch for support.  Shadow box: Lift and lower the left leg; jab with the right arm, then the left; then lift and lower the right leg.  Fence (you don't even need swords). Pretend you're holding a sword in each hand. Create an X pattern standing still, then moving forward and back.  Hop on your exercise bike or treadmill -- or, for something different, use a weighted hula hoop. If you don't have any of those, just go back to dancing.  Do abdominal crunches (hold a weighted ball for added resistance).  Cool down with [employer] "I Feel Good" -- or whatever tune makes you feel good

## 2018-04-21 NOTE — Progress Notes (Signed)
Patient ID: Marissa Roberts, female   DOB: April 22, 1945, 73 y.o.   MRN: [id number]               Reason for Appointment:  Follow-up for diabetes and hypertension   History of Present Illness:             Type 2 diabetes mellitus, date of diagnosis: 2005        Recent history:   INSULIN regimen is  Tresiba 54 units am , Novolog  15-20 units bid- tid ac  Oral hypoglycemic drugs the patient is taking are: Metformin 2 g daily  Her  A1c is now slightly higher at 7.4   Current blood sugar patterns, management and problems identified:  Although she is taking her insulin regimen as directed her A1c is slightly higher  She may be inconsistent with her diet and also she is not able to adjust her mealtime insulin based on what she is eating and usually takes the same amount  Fasting readings are generally fairly good but higher last month  She has been reminded to check her sugars after meals but she forgets to do so, also she thinks that whenever she remembers it is usually right after eating  She has not been motivated to exercise again  Has difficulty losing weight     Side effects from medications have been:none  Compliance with the medical regimen: Fair  Glucose monitoring:  done 1 time a day         Glucometer:  One Touch.      Blood Glucose readings from review of monitor  MORNING range 102-177 with average 134 over 30 days Dinnertime 158  Previous fasting average 120   Self-care:   Meals: 3 meals per day. Breakfast is yogurt or toast, bacon, egg; usually not a lot of carbohydrates, Periodically fried food including fish, french fries.    Not always restricting fat intake or sweet tea  when eating out        Exercise:  none  Dietician visit, most recent:2014.               Weight history:  Wt Readings from Last 3 Encounters:  04/21/18 240 lb 6.4 oz (109 kg)  03/18/18 239 lb (108.4 kg)  01/19/18 240 lb (108.9 kg)    Glycemic control:   Lab Results    Component Value Date   HGBA1C 7.4 (H) 04/17/2018   HGBA1C 7.2 (H) 01/12/2018   HGBA1C 7.6 (H) 10/10/2017   Lab Results  Component Value Date   MICROALBUR 27.2 (H) 10/10/2017   LDLCALC 77 04/17/2018   CREATININE 0.81 04/17/2018   No results found for: FRUCTOSAMINE    Past history:  She thinks her blood sugars were only mildly increased at borderline levels at the onsetper Most likely she was treated with metformin initially and this has been continued.   At some point she was also tried on [place] and [place] for improving her control. She is not sure why she was started on insulin 5 years ago, presumably for worsening hyperglycemia Also not clear if she has tried various insulin regimens before starting Lantus and NovoLog  Previously had tried the V-go pump but subsequently she stopped doing the V-go pump because of the cost, skin irritation and local discomfort   Allergies as of 04/21/2018      Reactions   Contrast Media [iodinated Diagnostic Agents]    Throat swells from ct con tra   Adhesive [tape]  No paper tape, causes blisters   Erythromycin    REACTION: rash   Penicillins    REACTION: rash      Medication List       Accurate as of April 21, 2018  9:32 AM. Always use your most recent med list.        amLODipine 5 MG tablet Commonly known as:  NORVASC Take 1 tablet (5 mg total) by mouth daily.   fluticasone 50 MCG/ACT nasal spray Commonly known as:  FLONASE SPRAY 2 SPRAYS INTO EACH NOSTRIL EVERY DAY   furosemide 40 MG tablet Commonly known as:  LASIX Take 2 tablets (80 mg total) by mouth daily.   Insulin Degludec 200 UNIT/ML Sopn Commonly known as:  TRESIBA FLEXTOUCH Inject 54 Units into the skin every morning.   Insulin Pen Needle 31G X 8 MM Misc Commonly known as:  EASY TOUCH PEN NEEDLES USE TO INJECT LANTUS AND NOVOLOG 4 TIMES PER DAY-DX CODE E11.9   Insulin Pen Needle 32G X 4 MM Misc Commonly known as:  BD PEN NEEDLE NANO U/F USE TO  INJECT INSULIN 4 TIMES DAILY   levothyroxine 137 MCG tablet Commonly known as:  SYNTHROID, LEVOTHROID TAKE ONE TABLET BY MOUTH ONCE DAILY   LINZESS 290 MCG Caps capsule Generic drug:  linaclotide Take 1 capsule (290 mcg total) by mouth daily.   metFORMIN 1000 MG tablet Commonly known as:  GLUCOPHAGE TAKE 1 TABLET BY MOUTH TWICE DAILY WITH MEALS   montelukast 10 MG tablet Commonly known as:  SINGULAIR TAKE 1 TABLET BY MOUTH EVERYDAY AT BEDTIME   NOVOLOG FLEXPEN 100 UNIT/ML FlexPen Generic drug:  insulin aspart INJECT UP TO 20 UNITS INTO THE SKIN BEFORE EACH MEAL   ONETOUCH DELICA LANCETS 35T Misc USE TO CHECK BLOOD SUGAR UP TO THREE TIMES A DAY 2 HOURS AFTER A MEAL AND UP TO 3 TIMES A WEEK UPON WAKING UP   ONETOUCH VERIO test strip Generic drug:  glucose blood PLEASE CHECK BLOOD SUGARS AT LEAST HALF THE TIME ABOUT 2 HOURS AFTER ANY MEAL AND 3 TIMES PER DAY ON WAKING UP   potassium chloride 10 MEQ tablet Commonly known as:  K-DUR TAKE 1 TABLET BY MOUTH TWICE DAILY   rosuvastatin 40 MG tablet Commonly known as:  CRESTOR TAKE 1 TABLET BY MOUTH ONCE DAILY   valsartan-hydrochlorothiazide 320-12.5 MG tablet Commonly known as:  DIOVAN-HCT Take 1 tablet by mouth daily.       Allergies:  Allergies  Allergen Reactions  . Contrast Media [Iodinated Diagnostic Agents]     Throat swells from ct con tra  . Adhesive [Tape]     No paper tape, causes blisters  . Erythromycin     REACTION: rash  . Penicillins     REACTION: rash    Past Medical History:  Diagnosis Date  . Arthritis   . Bronchitis    hx of  . CHF (congestive heart failure) ([place])   . Diabetes mellitus   . GERD (gastroesophageal reflux disease)   . Hypercholesteremia   . Hypertension   . Hypothyroidism   . Pneumonia    hx of  . PONV (postoperative nausea and vomiting)   . Thyroid disease     Past Surgical History:  Procedure Laterality Date  . ABDOMINAL HYSTERECTOMY     partial  . BREAST SURGERY      lumpectomy left breast  . carpel tunnel    . COLONOSCOPY W/ POLYPECTOMY    . FOOT SURGERY    .  knee arhtroscopy    . SHOULDER ARTHROSCOPY WITH SUBACROMIAL DECOMPRESSION, ROTATOR CUFF REPAIR AND BICEP TENDON REPAIR Right 05/26/2012   Procedure: Right Shoulder Diagnostic Operative Arthroscopy, Subacromial Decompression, Biceps Tenodesis, Mini Open Rotator Cuff Repair;  Surgeon: [name], MD;  Location: [facility];  Service: Orthopedics;  Laterality: Right;  Right Shoulder Diagnostic Operative Arthroscopy, Subacromial Decompression, Biceps Tenodesis, Mini Open Rotator Cuff Repair  . TONSILLECTOMY      Family History  Adopted: Yes  Problem Relation Age of Onset  . Diabetes Mother   . Heart disease Mother   . Hyperlipidemia Mother   . Coronary artery disease Other     Social History:  reports that she quit smoking about 28 years ago. She has never used smokeless tobacco. She reports that she does not drink alcohol or use drugs.    Review of Systems   HYPERTENSION: This is well controlled, patient is on amlodipine 5 mg daily and Diovan HCT 320/12.5  Not clear why her blood pressure is higher today, she has taken her blood pressure medication regularly However she did not take her Lasix the last 3 days because she takes it at bedtime and she fell asleep before she took the medication Usually not checking blood pressure at home  BP Readings from Last 3 Encounters:  04/21/18 (!) 150/74  03/18/18 136/80  01/19/18 140/70    On Lasix 80mg  for edema daily and this is now well controlled Even with not taking Lasix for 3 days her swelling has not come back  LIPIDS:  She is taking Crestor 40 mg, previously LDL was high with simvastatin 20 mg LDL below 100 again       Lab Results  Component Value Date   CHOL 157 04/17/2018   HDL 51.00 04/17/2018   LDLCALC 77 04/17/2018   LDLDIRECT 129.5 02/06/2012   TRIG 145.0 04/17/2018   CHOLHDL 3 04/17/2018                  Thyroid:      She has been hypothyroid for over 40 years and is taking a supplement of 137 g levothyroxine, 6 tablets per week  Last labs:   Lab Results  Component Value Date   TSH 1.95 10/10/2017   TSH 1.03 04/11/2017   TSH 1.13 10/08/2016   FREET4 1.05 10/10/2017   FREET4 1.13 04/11/2017   FREET4 1.03 09/26/2015       Physical Examination:  BP (!) 150/74   Pulse 78   Ht 5\' 5"  (1.651 m)   Wt 240 lb 6.4 oz (109 kg)   SpO2 98%   BMI 40.00 kg/m      ASSESSMENT/PLAN:  DIABETES on insulin:  See history of present illness for detailed discussion of current diabetes management, blood sugar patterns and problems identified  A1c is again over 7% and gradually increasing up to 7.4  She is on insulin and metformin Since her morning sugars are averaging about 130 and her A1c is equivalent to about 165 she likely has periodic high readings after meals which she does not monitor Also not exercising or trying to lose weight Diet can be better with restricting portions and occasional sweet drinks  Recommendations:   She was given information on indoor exercises  No change in insulin regimen  Discussed that she can randomly check her sugars after lunch or dinner to help adjust her mealtime dose  Also needs to cut back consistently on sweet drinks and high-fat foods  A1c in  3 months   HYPERTENSION: Blood pressure is relatively higher Not clear if this is from not taking Lasix recently Blood pressure was better last month with PCP  For now we will not change her blood pressure regimen as yet but follow-up in 3 months  She can reduce her Lasix to 40 mg daily since she did not appear to have edema even with missing the medication for 3 days  HYPOTHYROIDISM: She will need follow-up labs on the next visit      Patient Instructions  Check blood sugars on waking up 3 days a week  Also check blood sugars about 2-3 hours after meals and do this after different meals by  rotation  Recommended blood sugar levels on waking up are 90-130 and about 2 hours after meal is 130-160  Please bring your blood sugar monitor to each visit, thank you   Take 1 pill of Lasix   INDOOR EXERCISE IDEAS   Use the following examples for a creative indoor workout (perform each move for 2-3 minutes):   Warm up. Put on some music that makes you feel like moving, and dance around the living room.  Watch exercise shows on TV and move along with them. There are tons of free cable channels that have daily exercise shows on them for all levels - beginner through advanced.   You can easily find a number of exercise videos but use one that will suit your liking and exercise level; you can do these on your own schedule.  Walk up and down the steps.  Do dumbbell curls and presses (if you don't have weights, use full water bottles).  Do assisted squats, keeping your back on a fitness ball against the wall or using the back of the couch for support.  Shadow box: Lift and lower the left leg; jab with the right arm, then the left; then lift and lower the right leg.  Fence (you don't even need swords). Pretend you're holding a sword in each hand. Create an X pattern standing still, then moving forward and back.  Hop on your exercise bike or treadmill -- or, for something different, use a weighted hula hoop. If you don't have any of those, just go back to dancing.  Do abdominal crunches (hold a weighted ball for added resistance).  Cool down with [employer] "I Feel Good" -- or whatever tune makes you feel good        Elayne Snare 04/21/2018, 9:32 AM   Note: This office note was prepared with Dragon voice recognition system technology. Any transcriptional errors that result from this process are unintentional.

## 2018-04-22 ENCOUNTER — Other Ambulatory Visit: Payer: Self-pay

## 2018-04-22 ENCOUNTER — Telehealth: Payer: Self-pay | Admitting: Endocrinology

## 2018-04-22 MED ORDER — GLUCOSE BLOOD VI STRP: ORAL_STRIP | 4 refills | Status: DC

## 2018-04-22 MED ORDER — INSULIN PEN NEEDLE 31G X 8 MM MISC: 2 refills | Status: DC

## 2018-04-22 NOTE — Telephone Encounter (Signed)
Patient states she would like to inform Olen Cordial to send RX's to [place], [place] - [place]

## 2018-04-22 NOTE — Telephone Encounter (Signed)
Rx sent 

## 2018-05-12 ENCOUNTER — Telehealth: Payer: Self-pay | Admitting: Endocrinology

## 2018-05-12 ENCOUNTER — Other Ambulatory Visit: Payer: Self-pay | Admitting: Endocrinology

## 2018-05-12 MED ORDER — INSULIN ASPART 100 UNIT/ML FLEXPEN: PEN_INJECTOR | SUBCUTANEOUS | 1 refills | Status: DC

## 2018-05-12 MED ORDER — ONETOUCH DELICA LANCETS 33G MISC: 2 refills | Status: DC

## 2018-05-12 MED ORDER — INSULIN PEN NEEDLE 32G X 4 MM MISC: 1 refills | Status: DC

## 2018-05-12 MED ORDER — POTASSIUM CHLORIDE ER 10 MEQ PO TBCR: 10.0000 meq | EXTENDED_RELEASE_TABLET | Freq: Two times a day (BID) | ORAL | 3 refills | Status: DC

## 2018-05-12 MED ORDER — VALSARTAN-HYDROCHLOROTHIAZIDE 320-12.5 MG PO TABS: 1.0000 | ORAL_TABLET | Freq: Every day | ORAL | 3 refills | Status: DC

## 2018-05-12 MED ORDER — GLUCOSE BLOOD VI STRP: ORAL_STRIP | 4 refills | Status: DC

## 2018-05-12 MED ORDER — INSULIN DEGLUDEC 200 UNIT/ML ~~LOC~~ SOPN: 54.0000 [IU] | PEN_INJECTOR | Freq: Every morning | SUBCUTANEOUS | 3 refills | Status: DC

## 2018-05-12 NOTE — Telephone Encounter (Signed)
Reviewed confirmation receipts for all Rx's. All Rx's were sent to [facility] and all have confirmation receipts indicating Rx's have been received by the pharmacy.

## 2018-05-12 NOTE — Telephone Encounter (Signed)
Patient called and wanted to be sure that all of her Rx were going to [facility].  She is going today to fill her Novolog and wants to be sure before she goes to pick up.

## 2018-06-04 ENCOUNTER — Other Ambulatory Visit: Payer: Self-pay | Admitting: Internal Medicine

## 2018-06-04 DIAGNOSIS — E039 Hypothyroidism, unspecified: Secondary | ICD-10-CM

## 2018-06-09 ENCOUNTER — Encounter: Payer: Self-pay | Admitting: Podiatry

## 2018-06-09 ENCOUNTER — Ambulatory Visit: Payer: Medicare Other | Admitting: Podiatry

## 2018-06-09 DIAGNOSIS — L84 Corns and callosities: Secondary | ICD-10-CM

## 2018-06-09 DIAGNOSIS — B351 Tinea unguium: Secondary | ICD-10-CM

## 2018-06-09 DIAGNOSIS — M79674 Pain in right toe(s): Secondary | ICD-10-CM | POA: Diagnosis not present

## 2018-06-09 DIAGNOSIS — Z794 Long term (current) use of insulin: Secondary | ICD-10-CM

## 2018-06-09 DIAGNOSIS — M79675 Pain in left toe(s): Secondary | ICD-10-CM

## 2018-06-09 DIAGNOSIS — E119 Type 2 diabetes mellitus without complications: Secondary | ICD-10-CM

## 2018-06-09 NOTE — Patient Instructions (Signed)
Diabetes Mellitus and Foot Care Foot care is an important part of your health, especially when you have diabetes. Diabetes may cause you to have problems because of poor blood flow (circulation) to your feet and legs, which can cause your skin to:  Become thinner and drier.  Break more easily.  Heal more slowly.  Peel and crack. You may also have nerve damage (neuropathy) in your legs and feet, causing decreased feeling in them. This means that you may not notice minor injuries to your feet that could lead to more serious problems. Noticing and addressing any potential problems early is the best way to prevent future foot problems. How to care for your feet Foot hygiene  Wash your feet daily with warm water and mild soap. Do not use hot water. Then, pat your feet and the areas between your toes until they are completely dry. Do not soak your feet as this can dry your skin.  Trim your toenails straight across. Do not dig under them or around the cuticle. File the edges of your nails with an emery board or nail file.  Apply a moisturizing lotion or petroleum jelly to the skin on your feet and to dry, brittle toenails. Use lotion that does not contain alcohol and is unscented. Do not apply lotion between your toes. Shoes and socks  Wear clean socks or stockings every day. Make sure they are not too tight. Do not wear knee-high stockings since they may decrease blood flow to your legs.  Wear shoes that fit properly and have enough cushioning. Always look in your shoes before you put them on to be sure there are no objects inside.  To break in new shoes, wear them for just a few hours a day. This prevents injuries on your feet. Wounds, scrapes, corns, and calluses  Check your feet daily for blisters, cuts, bruises, sores, and redness. If you cannot see the bottom of your feet, use a mirror or ask someone for help.  Do not cut corns or calluses or try to remove them with medicine.  If you  find a minor scrape, cut, or break in the skin on your feet, keep it and the skin around it clean and dry. You may clean these areas with mild soap and water. Do not clean the area with peroxide, alcohol, or iodine.  If you have a wound, scrape, corn, or callus on your foot, look at it several times a day to make sure it is healing and not infected. Check for: ? Redness, swelling, or pain. ? Fluid or blood. ? Warmth. ? Pus or a bad smell. General instructions  Do not cross your legs. This may decrease blood flow to your feet.  Do not use heating pads or hot water bottles on your feet. They may burn your skin. If you have lost feeling in your feet or legs, you may not know this is happening until it is too late.  Protect your feet from hot and cold by wearing shoes, such as at the beach or on hot pavement.  Schedule a complete foot exam at least once a year (annually) or more often if you have foot problems. If you have foot problems, report any cuts, sores, or bruises to your health care provider immediately. Contact a health care provider if:  You have a medical condition that increases your risk of infection and you have any cuts, sores, or bruises on your feet.  You have an injury that is not   healing.  You have redness on your legs or feet.  You feel burning or tingling in your legs or feet.  You have pain or cramps in your legs and feet.  Your legs or feet are numb.  Your feet always feel cold.  You have pain around a toenail. Get help right away if:  You have a wound, scrape, corn, or callus on your foot and: ? You have pain, swelling, or redness that gets worse. ? You have fluid or blood coming from the wound, scrape, corn, or callus. ? Your wound, scrape, corn, or callus feels warm to the touch. ? You have pus or a bad smell coming from the wound, scrape, corn, or callus. ? You have a fever. ? You have a red line going up your leg. Summary  Check your feet every day  for cuts, sores, red spots, swelling, and blisters.  Moisturize feet and legs daily.  Wear shoes that fit properly and have enough cushioning.  If you have foot problems, report any cuts, sores, or bruises to your health care provider immediately.  Schedule a complete foot exam at least once a year (annually) or more often if you have foot problems. This information is not intended to replace advice given to you by your health care provider. Make sure you discuss any questions you have with your health care provider. Document Released: 03/15/2000 Document Revised: 04/30/2017 Document Reviewed: 04/19/2016 Elsevier Interactive Patient Education  2019 Elsevier Inc.  Onychomycosis/Fungal Toenails  WHAT IS IT? An infection that lies within the keratin of your nail plate that is caused by a fungus.  WHY ME? Fungal infections affect all ages, sexes, races, and creeds.  There may be many factors that predispose you to a fungal infection such as age, coexisting medical conditions such as diabetes, or an autoimmune disease; stress, medications, fatigue, genetics, etc.  Bottom line: fungus thrives in a warm, moist environment and your shoes offer such a location.  IS IT CONTAGIOUS? Theoretically, yes.  You do not want to share shoes, nail clippers or files with someone who has fungal toenails.  Walking around barefoot in the same room or sleeping in the same bed is unlikely to transfer the organism.  It is important to realize, however, that fungus can spread easily from one nail to the next on the same foot.  HOW DO WE TREAT THIS?  There are several ways to treat this condition.  Treatment may depend on many factors such as age, medications, pregnancy, liver and kidney conditions, etc.  It is best to ask your doctor which options are available to you.  1. No treatment.   Unlike many other medical concerns, you can live with this condition.  However for many people this can be a painful condition and  may lead to ingrown toenails or a bacterial infection.  It is recommended that you keep the nails cut short to help reduce the amount of fungal nail. 2. Topical treatment.  These range from herbal remedies to prescription strength nail lacquers.  About 40-50% effective, topicals require twice daily application for approximately 9 to 12 months or until an entirely new nail has grown out.  The most effective topicals are medical grade medications available through physicians offices. 3. Oral antifungal medications.  With an 80-90% cure rate, the most common oral medication requires 3 to 4 months of therapy and stays in your system for a year as the new nail grows out.  Oral antifungal medications do require   blood work to make sure it is a safe drug for you.  A liver function panel will be performed prior to starting the medication and after the first month of treatment.  It is important to have the blood work performed to avoid any harmful side effects.  In general, this medication safe but blood work is required. 4. Laser Therapy.  This treatment is performed by applying a specialized laser to the affected nail plate.  This therapy is noninvasive, fast, and non-painful.  It is not covered by insurance and is therefore, out of pocket.  The results have been very good with a 80-95% cure rate.  [facility] is the only practice in the area to offer this therapy. 5. Permanent Nail Avulsion.  Removing the entire nail so that a new nail will not grow back. 

## 2018-06-09 NOTE — Progress Notes (Signed)
Subjective: Marissa Roberts presents with h/o  diabetes and cc of painful, discolored, thick toenails and painful callus/corn which interfere with activities of daily living. Pain is aggravated when wearing enclosed shoe gear. Pain is relieved with periodic professional debridement.  [name], MD is her PCP and last visit was 03/18/2018.   Current Outpatient Medications:  .  amLODipine (NORVASC) 5 MG tablet, Take 1 tablet (5 mg total) by mouth daily., Disp: 90 tablet, Rfl: 3 .  Cholecalciferol (VITAMIN D3) 1.25 MG (50000 UT) CAPS, Take 1 capsule by mouth once a week., Disp: , Rfl:  .  fluticasone (FLONASE) 50 MCG/ACT nasal spray, SPRAY 2 SPRAYS INTO EACH NOSTRIL EVERY DAY, Disp: 48 g, Rfl: 1 .  furosemide (LASIX) 40 MG tablet, Take 2 tablets (80 mg total) by mouth daily., Disp: 180 tablet, Rfl: 1 .  glucose blood (ONETOUCH VERIO) test strip, PLEASE CHECK BLOOD SUGARS AT LEAST HALF THE TIME ABOUT 2 HOURS AFTER ANY MEAL AND 3 TIMES PER DAY ON WAKING UP, Disp: 250 each, Rfl: 4 .  insulin aspart (NOVOLOG FLEXPEN) 100 UNIT/ML FlexPen, INJECT UP TO 20 UNITS INTO THE SKIN BEFORE EACH MEAL, Disp: 15 pen, Rfl: 1 .  Insulin Degludec (TRESIBA FLEXTOUCH) 200 UNIT/ML SOPN, Inject 54 Units into the skin every morning., Disp: 9 pen, Rfl: 3 .  Insulin Pen Needle (BD PEN NEEDLE NANO U/F) 32G X 4 MM MISC, USE TO INJECT INSULIN 4 TIMES DAILY, Disp: 200 each, Rfl: 1 .  levothyroxine (SYNTHROID, LEVOTHROID) 137 MCG tablet, Take 1 tablet by mouth once daily, Disp: 90 tablet, Rfl: 0 .  LINZESS 290 MCG CAPS capsule, Take 1 capsule (290 mcg total) by mouth daily., Disp: 90 capsule, Rfl: 1 .  metFORMIN (GLUCOPHAGE) 1000 MG tablet, TAKE 1 TABLET BY MOUTH TWICE DAILY WITH MEALS, Disp: 180 tablet, Rfl: 0 .  montelukast (SINGULAIR) 10 MG tablet, TAKE 1 TABLET BY MOUTH EVERYDAY AT BEDTIME, Disp: 30 tablet, Rfl: 2 .  ONETOUCH DELICA LANCETS 58K MISC, USE TO CHECK BLOOD SUGAR UP TO THREE TIMES A DAY 2 HOURS AFTER A  MEAL AND UP TO 3 TIMES A WEEK UPON WAKING UP, Disp: 300 each, Rfl: 2 .  potassium chloride (K-DUR) 10 MEQ tablet, Take 1 tablet (10 mEq total) by mouth 2 (two) times daily., Disp: 180 tablet, Rfl: 3 .  rosuvastatin (CRESTOR) 40 MG tablet, TAKE 1 TABLET BY MOUTH ONCE DAILY, Disp: 90 tablet, Rfl: 3 .  valsartan-hydrochlorothiazide (DIOVAN-HCT) 320-12.5 MG tablet, Take 1 tablet by mouth daily., Disp: 90 tablet, Rfl: 3  Allergies  Allergen Reactions  . Contrast Media [Iodinated Diagnostic Agents]     Throat swells from ct con tra  . Adhesive [Tape]     No paper tape, causes blisters  . Erythromycin     REACTION: rash  . Penicillins     REACTION: rash    Vascular Examination: Capillary refill time <3 seconds x 10 digits.  Dorsalis pedis and Posterior tibial pulses present b/l.  Digital hair present x 10 digits  Skin temperature gradient WNL b/l.  Dermatological Examination: Skin with normal turgor and tone b/l.  Dry, flaky skin noted b/l.  Toenails 1-5 b/l discolored, thick, dystrophic with subungual debris and pain with palpation to nailbeds due to thickness of nails.  Hyperkeratotic lesion plantarmedial hallux IPJ b/l and dorsal 5th PIPJ b/l. No edema, no erythema, no flocculence, no drainage.  Musculoskeletal: Muscle strength 5/5 to all LE muscle groups  Neurological: Sensation intact with 10 gram  monofilament.  Vibratory sensation intact b/l.  Assessment: 1. Painful onychomycosis toenails 1-5 b/l 2. Calluses/corns: plantarmedial hallux IPJ b/l and dorsal 5th PIPJ b/l 3. NIDDM   Plan: 1. Continue diabetic foot care principles.  2. Toenails 1-5 b/l were debrided in length and girth without iatrogenic bleeding. 3. Hyperkeratotic lesion pared with sterile scalpel blade plantarmedial hallux IPJ b/l and dorsal 5th PIPJ b/l without incident. 4. Patient to continue soft, supportive shoe gear daily. 5. Patient to report any pedal injuries to medical professional.   6. Follow up 3 months.  7. Patient/POA to call should there be a concern in the interim.

## 2018-07-20 ENCOUNTER — Other Ambulatory Visit: Payer: Self-pay | Admitting: Endocrinology

## 2018-07-21 ENCOUNTER — Other Ambulatory Visit: Payer: Self-pay

## 2018-07-21 ENCOUNTER — Other Ambulatory Visit (INDEPENDENT_AMBULATORY_CARE_PROVIDER_SITE_OTHER): Payer: Medicare Other

## 2018-07-21 DIAGNOSIS — E039 Hypothyroidism, unspecified: Secondary | ICD-10-CM | POA: Diagnosis not present

## 2018-07-21 DIAGNOSIS — Z794 Long term (current) use of insulin: Secondary | ICD-10-CM

## 2018-07-21 DIAGNOSIS — E1165 Type 2 diabetes mellitus with hyperglycemia: Secondary | ICD-10-CM

## 2018-07-21 LAB — MICROALBUMIN / CREATININE URINE RATIO
Creatinine,U: 42.2 mg/dL
Microalb Creat Ratio: 52.8 mg/g — ABNORMAL HIGH (ref 0.0–30.0)
Microalb, Ur: 22.3 mg/dL — ABNORMAL HIGH (ref 0.0–1.9)

## 2018-07-21 LAB — TSH: TSH: 0.63 u[IU]/mL (ref 0.35–4.50)

## 2018-07-21 LAB — BASIC METABOLIC PANEL
BUN: 11 mg/dL (ref 6–23)
CO2: 29 mEq/L (ref 19–32)
Calcium: 10 mg/dL (ref 8.4–10.5)
Chloride: 103 mEq/L (ref 96–112)
Creatinine, Ser: 0.74 mg/dL (ref 0.40–1.20)
GFR: 93.64 mL/min (ref >60.00)
Glucose, Bld: 194 mg/dL — ABNORMAL HIGH (ref 70–99)
Potassium: 4.6 mEq/L (ref 3.5–5.1)
Sodium: 139 mEq/L (ref 135–145)

## 2018-07-21 LAB — T4, FREE: Free T4: 1.11 ng/dL (ref 0.60–1.60)

## 2018-07-21 LAB — HEMOGLOBIN A1C: Hgb A1c MFr Bld: 7.9 % — ABNORMAL HIGH (ref 4.6–6.5)

## 2018-07-24 ENCOUNTER — Other Ambulatory Visit: Payer: Self-pay

## 2018-07-24 ENCOUNTER — Encounter: Payer: Self-pay | Admitting: Endocrinology

## 2018-07-24 ENCOUNTER — Ambulatory Visit: Payer: Medicare Other | Admitting: Endocrinology

## 2018-07-24 VITALS — BP 140/86 | HR 100 | Ht 65.0 in | Wt 242.2 lb

## 2018-07-24 DIAGNOSIS — I10 Essential (primary) hypertension: Secondary | ICD-10-CM | POA: Diagnosis not present

## 2018-07-24 DIAGNOSIS — E1165 Type 2 diabetes mellitus with hyperglycemia: Secondary | ICD-10-CM | POA: Diagnosis not present

## 2018-07-24 DIAGNOSIS — R809 Proteinuria, unspecified: Secondary | ICD-10-CM

## 2018-07-24 DIAGNOSIS — E1129 Type 2 diabetes mellitus with other diabetic kidney complication: Secondary | ICD-10-CM | POA: Diagnosis not present

## 2018-07-24 DIAGNOSIS — E039 Hypothyroidism, unspecified: Secondary | ICD-10-CM

## 2018-07-24 DIAGNOSIS — Z794 Long term (current) use of insulin: Secondary | ICD-10-CM

## 2018-07-24 NOTE — Progress Notes (Signed)
Patient ID: Marissa Roberts, female   DOB: 1945-04-12, 73 y.o.   MRN: [id number]               Reason for Appointment:  Follow-up for diabetes and hypertension   History of Present Illness:             Type 2 diabetes mellitus, date of diagnosis: 2005        Recent history:   INSULIN regimen is  Tresiba 54 units am , Novolog  15-20 units bid- tid ac  Oral hypoglycemic drugs the patient is taking are: Metformin 2 g daily  Her  A1c is now further increased at 7.9 compared to 7.4   Current blood sugar patterns, management and problems identified:  After much discussion patient states that she is frequently not taking her NovoLog at dinnertime for fear of hypoglycemia because she had an episode with low sugar late at night when she had a light meal  She is not also remembering to take it before eating and sometimes will take it right after eating  As before she is forgetting to check her sugars after meals  With her not eating out as much she is eating somewhat healthy meals but has not lost any weight  Even though she is eating carbohydrates at breakfast she will not take any mealtime coverage frequently at this time  Morning sugars are higher when she forgets to take her evening NovoLog  At times will take only 10 units at dinnertime if eating a light meal     Side effects from medications have been:none  Compliance with the medical regimen: Fair  Glucose monitoring:  done 1 time a day         Glucometer:  One Touch.      Blood Glucose readings from review of monitor  FASTING blood sugar range 104-182 AVERAGE 136    Self-care:   Meals: 3 meals per day. Breakfast is usually grits with or without eggs or meat Not always restricting fat intake or sweet tea  when eating out        Exercise:  less now  Dietician visit, most recent:2014.               Weight history:  Wt Readings from Last 3 Encounters:  07/24/18 242 lb 3.2 oz (109.9 kg)  04/21/18 240 lb 6.4 oz  (109 kg)  03/18/18 239 lb (108.4 kg)    Glycemic control:   Lab Results  Component Value Date   HGBA1C 7.9 (H) 07/21/2018   HGBA1C 7.4 (H) 04/17/2018   HGBA1C 7.2 (H) 01/12/2018   Lab Results  Component Value Date   MICROALBUR 22.3 (H) 07/21/2018   LDLCALC 77 04/17/2018   CREATININE 0.74 07/21/2018   No results found for: FRUCTOSAMINE    Past history:  She thinks her blood sugars were only mildly increased at borderline levels at the onsetper Most likely she was treated with metformin initially and this has been continued.   At some point she was also tried on [place] and [place] for improving her control. She is not sure why she was started on insulin 5 years ago, presumably for worsening hyperglycemia Also not clear if she has tried various insulin regimens before starting Lantus and NovoLog  Previously had tried the V-go pump but subsequently she stopped doing the V-go pump because of the cost, skin irritation and local discomfort   Allergies as of 07/24/2018      Reactions  Contrast Media [iodinated Diagnostic Agents]    Throat swells from ct con tra   Adhesive [tape]    No paper tape, causes blisters   Erythromycin    REACTION: rash   Penicillins    REACTION: rash      Medication List       Accurate as of July 24, 2018  2:59 PM. Always use your most recent med list.        amLODipine 5 MG tablet Commonly known as:  NORVASC Take 1 tablet (5 mg total) by mouth daily.   fluticasone 50 MCG/ACT nasal spray Commonly known as:  FLONASE SPRAY 2 SPRAYS INTO EACH NOSTRIL EVERY DAY   furosemide 40 MG tablet Commonly known as:  LASIX Take 2 tablets (80 mg total) by mouth daily.   glucose blood test strip Commonly known as:  Tree surgeon BLOOD SUGARS AT LEAST HALF THE TIME ABOUT 2 HOURS AFTER ANY MEAL AND 3 TIMES PER DAY ON WAKING UP   insulin aspart 100 UNIT/ML FlexPen Commonly known as:  NovoLOG FlexPen INJECT UP TO 20 UNITS INTO THE  SKIN BEFORE EACH MEAL   Insulin Degludec 200 UNIT/ML Sopn Commonly known as:  [place] FlexTouch Inject 54 Units into the skin every morning.   Insulin Pen Needle 32G X 4 MM Misc Commonly known as:  BD Pen Needle Nano U/F USE TO INJECT INSULIN 4 TIMES DAILY   levothyroxine 137 MCG tablet Commonly known as:  SYNTHROID Take 1 tablet by mouth once daily   Linzess 290 MCG Caps capsule Generic drug:  linaclotide Take 1 capsule (290 mcg total) by mouth daily.   metFORMIN 1000 MG tablet Commonly known as:  GLUCOPHAGE TAKE 1 TABLET BY MOUTH TWICE DAILY WITH MEALS   montelukast 10 MG tablet Commonly known as:  SINGULAIR TAKE 1 TABLET BY MOUTH EVERYDAY AT BEDTIME   OneTouch Delica Lancets 91Y Misc USE TO CHECK BLOOD SUGAR UP TO THREE TIMES A DAY 2 HOURS AFTER A MEAL AND UP TO 3 TIMES A WEEK UPON WAKING UP   potassium chloride 10 MEQ tablet Commonly known as:  K-DUR Take 1 tablet (10 mEq total) by mouth 2 (two) times daily.   rosuvastatin 40 MG tablet Commonly known as:  CRESTOR TAKE 1 TABLET BY MOUTH ONCE DAILY   valsartan-hydrochlorothiazide 320-12.5 MG tablet Commonly known as:  DIOVAN-HCT Take 1 tablet by mouth daily.   Vitamin D3 1.25 MG (50000 UT) Caps Take 1 capsule by mouth once a week.       Allergies:  Allergies  Allergen Reactions  . Contrast Media [Iodinated Diagnostic Agents]     Throat swells from ct con tra  . Adhesive [Tape]     No paper tape, causes blisters  . Erythromycin     REACTION: rash  . Penicillins     REACTION: rash    Past Medical History:  Diagnosis Date  . Arthritis   . Bronchitis    hx of  . CHF (congestive heart failure) ([place])   . Diabetes mellitus   . GERD (gastroesophageal reflux disease)   . Hypercholesteremia   . Hypertension   . Hypothyroidism   . Pneumonia    hx of  . PONV (postoperative nausea and vomiting)   . Thyroid disease     Past Surgical History:  Procedure Laterality Date  . ABDOMINAL HYSTERECTOMY      partial  . BREAST SURGERY     lumpectomy left breast  . carpel tunnel    .  COLONOSCOPY W/ POLYPECTOMY    . FOOT SURGERY    . knee arhtroscopy    . SHOULDER ARTHROSCOPY WITH SUBACROMIAL DECOMPRESSION, ROTATOR CUFF REPAIR AND BICEP TENDON REPAIR Right 05/26/2012   Procedure: Right Shoulder Diagnostic Operative Arthroscopy, Subacromial Decompression, Biceps Tenodesis, Mini Open Rotator Cuff Repair;  Surgeon: [name], MD;  Location: [place];  Service: Orthopedics;  Laterality: Right;  Right Shoulder Diagnostic Operative Arthroscopy, Subacromial Decompression, Biceps Tenodesis, Mini Open Rotator Cuff Repair  . TONSILLECTOMY      Family History  Adopted: Yes  Problem Relation Age of Onset  . Diabetes Mother   . Heart disease Mother   . Hyperlipidemia Mother   . Coronary artery disease Other     Social History:  reports that she quit smoking about 28 years ago. She has never used smokeless tobacco. She reports that she does not drink alcohol or use drugs.    Review of Systems   HYPERTENSION: This is recently not controlled, patient is on amlodipine 5 mg daily and Diovan HCT 320/12.5  Usually not checking blood pressure at home  BP Readings from Last 3 Encounters:  07/24/18 140/86  04/21/18 (!) 150/74  03/18/18 136/80    On Lasix 80mg  alternating with 40 for edema daily and this is consistently well controlled   LIPIDS:  She is taking rosuvastatin 40 mg, previously LDL was high with simvastatin 20 mg LDL below 100       Lab Results  Component Value Date   CHOL 157 04/17/2018   HDL 51.00 04/17/2018   LDLCALC 77 04/17/2018   LDLDIRECT 129.5 02/06/2012   TRIG 145.0 04/17/2018   CHOLHDL 3 04/17/2018                  Thyroid:      She has been hypothyroid for over 40 years and is taking a supplement of 137 g levothyroxine, 6 tablets per week  Last labs:   Lab Results  Component Value Date   TSH 0.63 07/21/2018   TSH 1.95 10/10/2017   TSH 1.03 04/11/2017    FREET4 1.11 07/21/2018   FREET4 1.05 10/10/2017   FREET4 1.13 04/11/2017       Physical Examination:  BP 140/86 (BP Location: Left Arm, Patient Position: Sitting, Cuff Size: Large)   Pulse 100   Ht 5\' 5"  (1.651 m)   Wt 242 lb 3.2 oz (109.9 kg)   SpO2 95%   BMI 40.30 kg/m   No ankle edema present   ASSESSMENT/PLAN:  DIABETES on insulin:  See history of present illness for detailed discussion of current diabetes management, blood sugar patterns and problems identified  A1c is again over 7% and gradually increasing up to 7.9  She is on basal bolus insulin and metformin Her difficulties are with not controlling postprandial hyperglycemia She is not taking her mealtime insulin regularly especially at breakfast and suppertime Also not trying to adjust her mealtime dose based on what she is eating She can do better with glucose monitoring and consistently forgets to check her sugars after meals Currently with not eating out as much she may not be doing as much sweet tea and high-fat foods  Discussed that it is important that she take her mealtime insulin consistently at dinnertime She will also take at least 10 units at breakfast of NovoLog Also continue taking 15 units of NovoLog at dinnertime and can adjust up or down 5 units for meals that are larger or smaller Recommended that she try  and take her insulin when she starts warming up her food so she does not forget  She can try and ask her husband to remind her to check her blood sugar at bedtime regularly to help adjust her evening dose Discussed targets for fasting readings to be under 130 and after meals at least under 180 As before discussed the need for exercise and increase walking regularly even if she can do some indoors   HYPERTENSION: Blood pressure is relatively higher She also has persistent microalbuminuria indicating need for better control  Since she just refilled her Norvasc she can try taking 5 mg twice  daily and also increase her Lasix to 80 mg daily She will call if she has any difficulty with edema May consider switching to diltiazem ER for benefits on her microalbuminuria  HYPOTHYROIDISM: She will continue same dose of levothyroxine as her TSH is consistently normal  Counseling time on subjects discussed in assessment and plan sections is over 50% of today's 25 minute visit    Patient Instructions  Check blood sugars on waking up 4 days a week  Also check blood sugars about 2 hours after meals and do this after different meals by rotation  Recommended blood sugar levels on waking up are 90-130 and about 2 hours after meal is 130-160  Please bring your blood sugar monitor to each visit, thank you  Take amlodipine 2 daily  Take 2 Lasix daily  Take 10 Novolog at [place] before the meals      Elayne Snare 07/24/2018, 2:59 PM   Note: This office note was prepared with Dragon voice recognition system technology. Any transcriptional errors that result from this process are unintentional.

## 2018-07-24 NOTE — Patient Instructions (Addendum)
Check blood sugars on waking up 4 days a week  Also check blood sugars about 2 hours after meals and do this after different meals by rotation  Recommended blood sugar levels on waking up are 90-130 and about 2 hours after meal is 130-160  Please bring your blood sugar monitor to each visit, thank you  Take amlodipine 2 daily  Take 2 Lasix daily  Take 10 Novolog at [place] before the meals

## 2018-08-05 ENCOUNTER — Other Ambulatory Visit: Payer: Self-pay | Admitting: Endocrinology

## 2018-08-19 ENCOUNTER — Other Ambulatory Visit: Payer: Self-pay | Admitting: Endocrinology

## 2018-08-25 ENCOUNTER — Telehealth: Payer: Self-pay

## 2018-08-25 NOTE — Telephone Encounter (Signed)
States that she is now having issues with taking the levothyroxine is causing her to have stomach pains and also severe nausea. Is wanting to know what she should do about that. I informed her that she should not take with food, but patient does not want to take the medication if it is going to continue making her feel this way. Patient is unsure why this is all the sudden happening has been taking the same way for years without any issues

## 2018-08-25 NOTE — Telephone Encounter (Signed)
Can you please schedule patient a virtual video visit to discuss new symptoms earlier than appointment that is already made for in June. Thank you

## 2018-08-25 NOTE — Telephone Encounter (Signed)
Patient does not want to do a virtual, phone call or earlier visit than the 19th informed that she should not take her medication with food.

## 2018-08-25 NOTE — Telephone Encounter (Signed)
Called patient to schedule. She is unable to do a visit virtually and "does not feel comfortable" doing a phone call visit. She said that she will wait until her June 19th visit with Dr Sharlet Salina to discuss this and will continue to take the medication with food.

## 2018-08-25 NOTE — Telephone Encounter (Signed)
Copied from [place] [id number]. Topic: General - Other >> Aug 25, 2018  9:00 AM Rainey Pines A wrote: Patient wouldl ike a callback from Dr. Sharlet Salina nurse in regards to eating with levothyroxine (SYNTHROID, LEVOTHROID) 137 MCG tablet  she takes.

## 2018-08-25 NOTE — Telephone Encounter (Signed)
She may need a virtual or telephone visit to assess what could be causing the stomach problems. It is not likely to be the synthroid as she has been on this for some time.

## 2018-09-14 ENCOUNTER — Other Ambulatory Visit: Payer: Self-pay | Admitting: Endocrinology

## 2018-09-15 ENCOUNTER — Encounter: Payer: Self-pay | Admitting: Podiatry

## 2018-09-15 ENCOUNTER — Other Ambulatory Visit: Payer: Self-pay

## 2018-09-15 ENCOUNTER — Ambulatory Visit: Payer: Medicare Other | Admitting: Podiatry

## 2018-09-15 VITALS — HR 97

## 2018-09-15 DIAGNOSIS — E119 Type 2 diabetes mellitus without complications: Secondary | ICD-10-CM | POA: Diagnosis not present

## 2018-09-15 DIAGNOSIS — Z794 Long term (current) use of insulin: Secondary | ICD-10-CM

## 2018-09-15 DIAGNOSIS — M79674 Pain in right toe(s): Secondary | ICD-10-CM | POA: Diagnosis not present

## 2018-09-15 DIAGNOSIS — B351 Tinea unguium: Secondary | ICD-10-CM | POA: Diagnosis not present

## 2018-09-15 DIAGNOSIS — L84 Corns and callosities: Secondary | ICD-10-CM

## 2018-09-15 DIAGNOSIS — M79675 Pain in left toe(s): Secondary | ICD-10-CM

## 2018-09-15 DIAGNOSIS — H40023 Open angle with borderline findings, high risk, bilateral: Secondary | ICD-10-CM | POA: Diagnosis not present

## 2018-09-15 NOTE — Patient Instructions (Signed)
Diabetes Mellitus and Foot Care Foot care is an important part of your health, especially when you have diabetes. Diabetes may cause you to have problems because of poor blood flow (circulation) to your feet and legs, which can cause your skin to:  Become thinner and drier.  Break more easily.  Heal more slowly.  Peel and crack. You may also have nerve damage (neuropathy) in your legs and feet, causing decreased feeling in them. This means that you may not notice minor injuries to your feet that could lead to more serious problems. Noticing and addressing any potential problems early is the best way to prevent future foot problems. How to care for your feet Foot hygiene  Wash your feet daily with warm water and mild soap. Do not use hot water. Then, pat your feet and the areas between your toes until they are completely dry. Do not soak your feet as this can dry your skin.  Trim your toenails straight across. Do not dig under them or around the cuticle. File the edges of your nails with an emery board or nail file.  Apply a moisturizing lotion or petroleum jelly to the skin on your feet and to dry, brittle toenails. Use lotion that does not contain alcohol and is unscented. Do not apply lotion between your toes. Shoes and socks  Wear clean socks or stockings every day. Make sure they are not too tight. Do not wear knee-high stockings since they may decrease blood flow to your legs.  Wear shoes that fit properly and have enough cushioning. Always look in your shoes before you put them on to be sure there are no objects inside.  To break in new shoes, wear them for just a few hours a day. This prevents injuries on your feet. Wounds, scrapes, corns, and calluses  Check your feet daily for blisters, cuts, bruises, sores, and redness. If you cannot see the bottom of your feet, use a mirror or ask someone for help.  Do not cut corns or calluses or try to remove them with medicine.  If you  find a minor scrape, cut, or break in the skin on your feet, keep it and the skin around it clean and dry. You may clean these areas with mild soap and water. Do not clean the area with peroxide, alcohol, or iodine.  If you have a wound, scrape, corn, or callus on your foot, look at it several times a day to make sure it is healing and not infected. Check for: ? Redness, swelling, or pain. ? Fluid or blood. ? Warmth. ? Pus or a bad smell. General instructions  Do not cross your legs. This may decrease blood flow to your feet.  Do not use heating pads or hot water bottles on your feet. They may burn your skin. If you have lost feeling in your feet or legs, you may not know this is happening until it is too late.  Protect your feet from hot and cold by wearing shoes, such as at the beach or on hot pavement.  Schedule a complete foot exam at least once a year (annually) or more often if you have foot problems. If you have foot problems, report any cuts, sores, or bruises to your health care provider immediately. Contact a health care provider if:  You have a medical condition that increases your risk of infection and you have any cuts, sores, or bruises on your feet.  You have an injury that is not   healing.  You have redness on your legs or feet.  You feel burning or tingling in your legs or feet.  You have pain or cramps in your legs and feet.  Your legs or feet are numb.  Your feet always feel cold.  You have pain around a toenail. Get help right away if:  You have a wound, scrape, corn, or callus on your foot and: ? You have pain, swelling, or redness that gets worse. ? You have fluid or blood coming from the wound, scrape, corn, or callus. ? Your wound, scrape, corn, or callus feels warm to the touch. ? You have pus or a bad smell coming from the wound, scrape, corn, or callus. ? You have a fever. ? You have a red line going up your leg. Summary  Check your feet every day  for cuts, sores, red spots, swelling, and blisters.  Moisturize feet and legs daily.  Wear shoes that fit properly and have enough cushioning.  If you have foot problems, report any cuts, sores, or bruises to your health care provider immediately.  Schedule a complete foot exam at least once a year (annually) or more often if you have foot problems. This information is not intended to replace advice given to you by your health care provider. Make sure you discuss any questions you have with your health care provider. Document Released: 03/15/2000 Document Revised: 04/30/2017 Document Reviewed: 04/19/2016 Elsevier Interactive Patient Education  2019 Elsevier Inc.  Onychomycosis/Fungal Toenails  WHAT IS IT? An infection that lies within the keratin of your nail plate that is caused by a fungus.  WHY ME? Fungal infections affect all ages, sexes, races, and creeds.  There may be many factors that predispose you to a fungal infection such as age, coexisting medical conditions such as diabetes, or an autoimmune disease; stress, medications, fatigue, genetics, etc.  Bottom line: fungus thrives in a warm, moist environment and your shoes offer such a location.  IS IT CONTAGIOUS? Theoretically, yes.  You do not want to share shoes, nail clippers or files with someone who has fungal toenails.  Walking around barefoot in the same room or sleeping in the same bed is unlikely to transfer the organism.  It is important to realize, however, that fungus can spread easily from one nail to the next on the same foot.  HOW DO WE TREAT THIS?  There are several ways to treat this condition.  Treatment may depend on many factors such as age, medications, pregnancy, liver and kidney conditions, etc.  It is best to ask your doctor which options are available to you.  1. No treatment.   Unlike many other medical concerns, you can live with this condition.  However for many people this can be a painful condition and  may lead to ingrown toenails or a bacterial infection.  It is recommended that you keep the nails cut short to help reduce the amount of fungal nail. 2. Topical treatment.  These range from herbal remedies to prescription strength nail lacquers.  About 40-50% effective, topicals require twice daily application for approximately 9 to 12 months or until an entirely new nail has grown out.  The most effective topicals are medical grade medications available through physicians offices. 3. Oral antifungal medications.  With an 80-90% cure rate, the most common oral medication requires 3 to 4 months of therapy and stays in your system for a year as the new nail grows out.  Oral antifungal medications do require   blood work to make sure it is a safe drug for you.  A liver function panel will be performed prior to starting the medication and after the first month of treatment.  It is important to have the blood work performed to avoid any harmful side effects.  In general, this medication safe but blood work is required. 4. Laser Therapy.  This treatment is performed by applying a specialized laser to the affected nail plate.  This therapy is noninvasive, fast, and non-painful.  It is not covered by insurance and is therefore, out of pocket.  The results have been very good with a 80-95% cure rate.  [facility] is the only practice in the area to offer this therapy. 5. Permanent Nail Avulsion.  Removing the entire nail so that a new nail will not grow back. 

## 2018-09-18 ENCOUNTER — Other Ambulatory Visit (INDEPENDENT_AMBULATORY_CARE_PROVIDER_SITE_OTHER): Payer: Medicare Other

## 2018-09-18 ENCOUNTER — Ambulatory Visit (INDEPENDENT_AMBULATORY_CARE_PROVIDER_SITE_OTHER): Payer: Medicare Other | Admitting: Internal Medicine

## 2018-09-18 ENCOUNTER — Encounter: Payer: Self-pay | Admitting: Internal Medicine

## 2018-09-18 ENCOUNTER — Other Ambulatory Visit: Payer: Self-pay

## 2018-09-18 VITALS — BP 142/80 | HR 82 | Temp 98.1°F | Ht 65.0 in | Wt 243.0 lb

## 2018-09-18 DIAGNOSIS — Z0000 Encounter for general adult medical examination without abnormal findings: Secondary | ICD-10-CM

## 2018-09-18 DIAGNOSIS — I10 Essential (primary) hypertension: Secondary | ICD-10-CM

## 2018-09-18 DIAGNOSIS — E118 Type 2 diabetes mellitus with unspecified complications: Secondary | ICD-10-CM

## 2018-09-18 DIAGNOSIS — R5383 Other fatigue: Secondary | ICD-10-CM | POA: Insufficient documentation

## 2018-09-18 DIAGNOSIS — E039 Hypothyroidism, unspecified: Secondary | ICD-10-CM

## 2018-09-18 LAB — VITAMIN B12: Vitamin B-12: 214 pg/mL (ref 211–911)

## 2018-09-18 LAB — VITAMIN D 25 HYDROXY (VIT D DEFICIENCY, FRACTURES): VITD: 37.88 ng/mL (ref 30.00–100.00)

## 2018-09-18 MED ORDER — MONTELUKAST SODIUM 10 MG PO TABS: 10.0000 mg | ORAL_TABLET | Freq: Every day | ORAL | 3 refills | Status: DC

## 2018-09-18 MED ORDER — LEVOTHYROXINE SODIUM 137 MCG PO TABS: ORAL_TABLET | ORAL | 3 refills | Status: DC

## 2018-09-18 NOTE — Assessment & Plan Note (Signed)
Suspect due to deconditioning with no exercise. Encouraged exercise regimen at home. Checking Vitamin D and B12 as not checked recently. She is not currently taking vitamin D as advised. Recent thyroid, CBC, BMP without indication for change.

## 2018-09-18 NOTE — Assessment & Plan Note (Signed)
Flu shot reminded. Pneumonia complete. Shingrix counseled. Tetanus due 2025. Colonoscopy due 2022. Mammogram due 2021, pap smear aged out and dexa due 2021. Counseled about sun safety and mole surveillance. Counseled about the dangers of distracted driving. Given 10 year screening recommendations.

## 2018-09-18 NOTE — Assessment & Plan Note (Signed)
Foot exam done. Taking metformin and last HgA1c at goal. Will not adjust meds today. Continue with podiatry as he has thick nails and cannot bend to reach own feet to trim. On ARB and statin.

## 2018-09-18 NOTE — Assessment & Plan Note (Signed)
BP very close to goal today so will keep amlodipine 10 mg, valsartan/hctz 320/12.5 mg, lasix 40 mg daily. Room to adjust if needed. Recent BMP at goal.

## 2018-09-18 NOTE — Progress Notes (Signed)
Subjective:   Patient ID: Marissa Roberts, female    DOB: 1945/06/21, 73 y.o.   MRN: [id number]  HPI Here for medicare wellness, with new complaints. Please see A/P for status and treatment of chronic medical problems.   HPI #2: Here for new fatigue (she stopped walking with the pandemic, now is unmotivated to walk etc, she has a hard time with energy levels, is not taking her vitamin D lately as she could not find any tablets of it and cannot take gummies), and diabetes (needs foot exam, taking her metformin, denies new numbness or pain in her feet, sees podiatry for nail trimming), and blood pressure (she has gone down to amlodipine 5 mg and then went back up to 10 mg when she saw endo BP was high, BP was mildly up at her eye doctor which is not unusual for her, denies headaches or chest pains).   Diet: DM since diabetic Physical activity: sedentary Depression/mood screen: negative Hearing: intact to whispered voice Visual acuity: grossly normal with lens, performs annual eye exam  ADLs: capable Fall risk: low Home safety: good Cognitive evaluation: intact to orientation, naming, recall and repetition EOL planning: adv directives discussed    Office Visit from 09/18/2018 in [place]  PHQ-2 Total Score  0        Clinical Support from 09/16/2017 in [place]  PHQ-9 Total Score  2     I have personally reviewed and have noted 1. The patient's medical and social history - reviewed today no changes 2. Their use of alcohol, tobacco or illicit drugs 3. Their current medications and supplements 4. The patient's functional ability including ADL's, fall risks, home safety risks and hearing or visual impairment. 5. Diet and physical activities 6. Evidence for depression or mood disorders 7. Care team reviewed and updated  Patient Care Team: [name], MD as PCP - General (Internal Medicine) Elayne Snare, MD as  Consulting Physician (Endocrinology) Past Medical History:  Diagnosis Date  . Arthritis   . Bronchitis    hx of  . CHF (congestive heart failure) ([place])   . Diabetes mellitus   . GERD (gastroesophageal reflux disease)   . Hypercholesteremia   . Hypertension   . Hypothyroidism   . Pneumonia    hx of  . PONV (postoperative nausea and vomiting)   . Thyroid disease    Past Surgical History:  Procedure Laterality Date  . ABDOMINAL HYSTERECTOMY     partial  . BREAST SURGERY     lumpectomy left breast  . carpel tunnel    . COLONOSCOPY W/ POLYPECTOMY    . FOOT SURGERY    . knee arhtroscopy    . SHOULDER ARTHROSCOPY WITH SUBACROMIAL DECOMPRESSION, ROTATOR CUFF REPAIR AND BICEP TENDON REPAIR Right 05/26/2012   Procedure: Right Shoulder Diagnostic Operative Arthroscopy, Subacromial Decompression, Biceps Tenodesis, Mini Open Rotator Cuff Repair;  Surgeon: [name], MD;  Location: [place];  Service: Orthopedics;  Laterality: Right;  Right Shoulder Diagnostic Operative Arthroscopy, Subacromial Decompression, Biceps Tenodesis, Mini Open Rotator Cuff Repair  . TONSILLECTOMY     Family History  Adopted: Yes  Problem Relation Age of Onset  . Diabetes Mother   . Heart disease Mother   . Hyperlipidemia Mother   . Coronary artery disease Other    Review of Systems  Constitutional: Positive for activity change and fatigue.  HENT: Negative.   Eyes: Negative.   Respiratory: Negative for cough, chest tightness  and shortness of breath.   Cardiovascular: Negative for chest pain, palpitations and leg swelling.  Gastrointestinal: Negative for abdominal distention, abdominal pain, constipation, diarrhea, nausea and vomiting.  Musculoskeletal: Negative.   Skin: Negative.   Neurological: Negative.   Psychiatric/Behavioral: Negative.     Objective:  Physical Exam Constitutional:      Appearance: She is well-developed. She is obese.  HENT:     Head: Normocephalic and atraumatic.  Neck:      Musculoskeletal: Normal range of motion.  Cardiovascular:     Rate and Rhythm: Normal rate and regular rhythm.  Pulmonary:     Effort: Pulmonary effort is normal. No respiratory distress.     Breath sounds: Normal breath sounds. No wheezing or rales.  Abdominal:     General: Bowel sounds are normal. There is no distension.     Palpations: Abdomen is soft.     Tenderness: There is no abdominal tenderness. There is no rebound.  Skin:    General: Skin is warm and dry.  Neurological:     Mental Status: She is alert and oriented to person, place, and time.     Coordination: Coordination normal.     Vitals:   09/18/18 0851  BP: (!) 142/80  Pulse: 82  Temp: 98.1 F (36.7 C)  TempSrc: Oral  SpO2: 97%  Weight: 243 lb (110.2 kg)  Height: 5\' 5"  (1.651 m)    Assessment & Plan:

## 2018-09-18 NOTE — Patient Instructions (Signed)

## 2018-09-28 NOTE — Progress Notes (Signed)
Subjective: Marissa Roberts presents to clinic for preventative diabetic foot care with  painful mycotic toenails and corns b/l 5th digits which are aggravated when wearing enclosed shoe gear.  This pain limits her daily activities. Pain symptoms resolve with periodic professional debridement.  [name], MD is her PCP. Last visit was 03/18/2018.   Current Outpatient Medications:  .  amLODipine (NORVASC) 5 MG tablet, Take 1 tablet (5 mg total) by mouth daily. (Patient taking differently: Take 5 mg by mouth 2 (two) times daily. ), Disp: 90 tablet, Rfl: 3 .  Cholecalciferol (VITAMIN D3) 1.25 MG (50000 UT) CAPS, Take 1 capsule by mouth once a week., Disp: , Rfl:  .  fluticasone (FLONASE) 50 MCG/ACT nasal spray, SPRAY 2 SPRAYS INTO EACH NOSTRIL EVERY DAY, Disp: 48 g, Rfl: 1 .  furosemide (LASIX) 40 MG tablet, Take 2 tablets (80 mg total) by mouth daily., Disp: 180 tablet, Rfl: 1 .  glucose blood (ONETOUCH VERIO) test strip, PLEASE CHECK BLOOD SUGARS AT LEAST HALF THE TIME ABOUT 2 HOURS AFTER ANY MEAL AND 3 TIMES PER DAY ON WAKING UP, Disp: 250 each, Rfl: 4 .  Insulin Degludec (TRESIBA FLEXTOUCH) 200 UNIT/ML SOPN, Inject 54 Units into the skin every morning., Disp: 9 pen, Rfl: 3 .  Insulin Pen Needle (BD PEN NEEDLE NANO U/F) 32G X 4 MM MISC, USE TO INJECT INSULIN 4 TIMES DAILY, Disp: 200 each, Rfl: 1 .  levothyroxine (SYNTHROID) 137 MCG tablet, Take 1 tablet by mouth once daily, Disp: 90 tablet, Rfl: 3 .  LINZESS 290 MCG CAPS capsule, Take 1 capsule (290 mcg total) by mouth daily., Disp: 90 capsule, Rfl: 1 .  metFORMIN (GLUCOPHAGE) 1000 MG tablet, TAKE 1 TABLET BY MOUTH TWICE DAILY WITH MEALS, Disp: 180 tablet, Rfl: 0 .  montelukast (SINGULAIR) 10 MG tablet, Take 1 tablet (10 mg total) by mouth at bedtime., Disp: 90 tablet, Rfl: 3 .  [place] FLEXPEN 100 UNIT/ML FlexPen, INJECT UP TO 20 UNITS INTO THE SKIN BEFORE EACH MEAL, Disp: 15 mL, Rfl: 0 .  OneTouch Delica Lancets 43P MISC, USE TO  CHECK BLOOD SUGAR UP TO THREE TIMES A DAY 2 HOURS AFTER A MEAL AND UP TO 3 TIMES A WEEK UPON WAKING UP, Disp: 300 each, Rfl: 0 .  potassium chloride (K-DUR) 10 MEQ tablet, Take 1 tablet (10 mEq total) by mouth 2 (two) times daily., Disp: 180 tablet, Rfl: 3 .  rosuvastatin (CRESTOR) 40 MG tablet, TAKE 1 TABLET BY MOUTH ONCE DAILY, Disp: 90 tablet, Rfl: 3 .  valsartan-hydrochlorothiazide (DIOVAN-HCT) 320-12.5 MG tablet, Take 1 tablet by mouth daily., Disp: 90 tablet, Rfl: 3   Allergies  Allergen Reactions  . Contrast Media [Iodinated Diagnostic Agents]     Throat swells from ct con tra  . Adhesive [Tape]     No paper tape, causes blisters  . Erythromycin     REACTION: rash  . Penicillins     REACTION: rash     Objective: Vitals:   09/15/18 0935  Pulse: 97    Physical Examination:  Vascular  Examination: Capillary refill time <3 seconds x 10 digits.  Palpable DP/PT pulses b/l.  Digital hair present b/l.  No edema noted b/l.  Skin temperature gradient WNL b/l.  Dermatological Examination: Skin with normal turgor, texture and tone b/l.  No open wounds b/l.  No interdigital macerations noted b/l.  Elongated, thick, discolored brittle toenails with subungual debris and pain on dorsal palpation of nailbeds 1-5 b/l.  Hyperkeratotic lesion  noted dorsal 5th digit PIPJ and b/l hallux with tenderness to palpation. No edema, no erythema, no drainage, no flocculence.  Musculoskeletal Examination: Muscle strength 5/5 to all muscle groups b/l.  Hammertoes b/l 5th digits.  No pain, crepitus or joint discomfort with active/passive ROM.  Neurological Examination: Sensation intact 5/5 b/l with 10 gram monofilament.  Vibratory sensation intact b/l.  Proprioceptive sensation intact b/l.  Assessment: 1. Mycotic nail infection with pain 1-5 b/l 2. Corns b/l 5th digits 3. NIDDM  Plan: 1. Toenails 1-5 b/l were debrided in length and girth without iatrogenic  laceration. 2. Corn(s) pared b/l 5th digits utilizing sterile scalpel blade without incident. 3. Continue soft, supportive shoe gear daily. 4. Report any pedal injuries to medical professional. 5. Follow up 3 months. 6. Patient/POA to call should there be a question/concern in there interim.

## 2018-10-13 ENCOUNTER — Other Ambulatory Visit: Payer: Self-pay | Admitting: Internal Medicine

## 2018-10-14 ENCOUNTER — Ambulatory Visit (INDEPENDENT_AMBULATORY_CARE_PROVIDER_SITE_OTHER): Payer: Medicare Other

## 2018-10-14 ENCOUNTER — Ambulatory Visit (INDEPENDENT_AMBULATORY_CARE_PROVIDER_SITE_OTHER): Payer: Medicare Other | Admitting: Orthopedic Surgery

## 2018-10-14 ENCOUNTER — Other Ambulatory Visit: Payer: Self-pay

## 2018-10-14 ENCOUNTER — Encounter: Payer: Self-pay | Admitting: Orthopedic Surgery

## 2018-10-14 DIAGNOSIS — M70049 Crepitant synovitis (acute) (chronic), unspecified hand: Secondary | ICD-10-CM

## 2018-10-14 DIAGNOSIS — M79641 Pain in right hand: Secondary | ICD-10-CM | POA: Diagnosis not present

## 2018-10-14 MED ORDER — DICLOFENAC SODIUM 50 MG PO TBEC: DELAYED_RELEASE_TABLET | ORAL | 0 refills | Status: AC

## 2018-10-14 NOTE — Progress Notes (Signed)
Office Visit Note   Patient: Marissa Roberts           Date of Birth: 15-Jan-1946           MRN: [id number] Visit Date: 10/14/2018 Requested by: [name], MD [place],  [place] [id number] PCP: [name], MD  Subjective: Chief Complaint  Patient presents with  . Right Hand - Pain    HPI: Pt is a 73 y.o. R-handed Female who presents to the clinic complaining of R hand pain.  Pt states that her pain began one month ago.  She localizes the pain to the MCP joints and the base of the thumb.  She notes that her thumb pain is worse in the morning and associated with triggering of the R thumb.  She notes swelling overlying her MCP joints. Not associated with any injuries and denies any numbness/tingling. Pain is worse with grip, opening doors, using a can opener.  She has tried Tylenol, Aleve, Aspercreme without relief.    Denies any history of RA or psoriatic arthritis.    Pt has a history of bilateral CTR and diabetes.                ROS: All systems reviewed are negative as they relate to the chief complaint within the history of present illness.  Patient denies  fevers or chills.   Assessment & Plan: Visit Diagnoses:  1. Pain in right hand     Plan: Pt's symptoms are concerning for RA due to MCP swelling and pain.  No such symptoms are present in the contralateral hand.  Ordered rheumatoid panel, including CBC with diff, ESR, RF, Anti-CCP Antibody, CRP, Uric acid, and ANA.  Prescribed a course of Voltaren 29m PO BID for 2 weeks and QD for 2 weeks afterward. We will address any persistent problems with her trigger thumb after the work-up of possible RA.  Pt agrees with plan.    Follow-Up Instructions: No follow-ups on file.   Orders:  Orders Placed This Encounter  Procedures  . XR Hand Complete Right  . CBC with Differential  . Sed Rate (ESR)  . Rheumatoid Factor  . Cyclic citrul peptide antibody, IgG  . Uric acid  . Antinuclear Antib (ANA)    Meds ordered this encounter  Medications  . diclofenac (VOLTAREN) 50 MG EC tablet    Sig: Take 1 tablet (50 mg total) by mouth 2 (two) times daily for 14 days, THEN 1 tablet (50 mg total) daily for 14 days.    Dispense:  42 tablet    Refill:  0      Procedures: No procedures performed   Clinical Data: No additional findings.  Objective: Vital Signs: There were no vitals taken for this visit.  Physical Exam:   Constitutional: Patient appears well-developed HEENT:  Head: Normocephalic Eyes:EOM are normal Neck: Normal range of motion Cardiovascular: Normal rate Pulmonary/chest: Effort normal Neurologic: Patient is alert Skin: Skin is warm Psychiatric: Patient has normal mood and affect    Ortho Exam:  R hand exam Swelling of the MCP joints of the R hand, worse over the 2nd and 3rd MCP joints TTP overlying the MCP joints and at the A1 pulley of the thumb No pain with circumduction of the thumb or with abduction of the thumb Well healed scar from previous CTR 4/5 grip strength compared to 5/5 grip strength on contralateral side 5/5 opposition strength    Specialty Comments:  No specialty comments  available.  Imaging: Xr Hand Complete Right  Result Date: 10/14/2018 AP lateral oblique right hand reviewed.  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  No acute fracture or dislocation.  Mild degenerative changes present throughout the midcarpal and radiocarpal region.    PMFS History: Patient Active Problem List   Diagnosis Date Noted  . Fatigue 09/18/2018  . Routine general medical examination at a health care facility 02/07/2015  . Morbid obesity ([place]) 04/19/2013  . Hyperlipidemia associated with type 2 diabetes mellitus ([place]) 07/28/2009  . Osteopenia 03/14/2009  . Diabetes mellitus type 2 with complications ([place]) 76/28/3151  . Hypothyroidism 09/24/2006  . Essential hypertension 09/24/2006   Past Medical History:  Diagnosis Date  . Arthritis   .  Bronchitis    hx of  . CHF (congestive heart failure) ([place])   . Diabetes mellitus   . GERD (gastroesophageal reflux disease)   . Hypercholesteremia   . Hypertension   . Hypothyroidism   . Pneumonia    hx of  . PONV (postoperative nausea and vomiting)   . Thyroid disease     Family History  Adopted: Yes  Problem Relation Age of Onset  . Diabetes Mother   . Heart disease Mother   . Hyperlipidemia Mother   . Coronary artery disease Other     Past Surgical History:  Procedure Laterality Date  . ABDOMINAL HYSTERECTOMY     partial  . BREAST SURGERY     lumpectomy left breast  . carpel tunnel    . COLONOSCOPY W/ POLYPECTOMY    . FOOT SURGERY    . knee arhtroscopy    . SHOULDER ARTHROSCOPY WITH SUBACROMIAL DECOMPRESSION, ROTATOR CUFF REPAIR AND BICEP TENDON REPAIR Right 05/26/2012   Procedure: Right Shoulder Diagnostic Operative Arthroscopy, Subacromial Decompression, Biceps Tenodesis, Mini Open Rotator Cuff Repair;  Surgeon: [name], MD;  Location: [facility];  Service: Orthopedics;  Laterality: Right;  Right Shoulder Diagnostic Operative Arthroscopy, Subacromial Decompression, Biceps Tenodesis, Mini Open Rotator Cuff Repair  . TONSILLECTOMY     Social History   Occupational History  . Not on file  Tobacco Use  . Smoking status: Former Smoker    Quit date: 04/01/1990    Years since quitting: 28.5  . Smokeless tobacco: Never Used  Substance and Sexual Activity  . Alcohol use: No  . Drug use: No  . Sexual activity: Never    Birth control/protection: Abstinence

## 2018-10-15 ENCOUNTER — Other Ambulatory Visit: Payer: Self-pay

## 2018-10-15 DIAGNOSIS — M79641 Pain in right hand: Secondary | ICD-10-CM

## 2018-10-15 NOTE — Progress Notes (Signed)
Please call patient.  She has laboratory findings consistent with possible early rheumatoid arthritis.  She needs referral to rheumatology.  Thanks

## 2018-10-16 ENCOUNTER — Encounter: Payer: Self-pay | Admitting: Orthopedic Surgery

## 2018-10-16 LAB — CBC WITH DIFFERENTIAL/PLATELET
Absolute Monocytes: 963 cells/uL — ABNORMAL HIGH (ref 200–950)
Basophils Absolute: 75 cells/uL (ref 0–200)
Basophils Relative: 0.6 %
Eosinophils Absolute: 325 cells/uL (ref 15–500)
Eosinophils Relative: 2.6 %
HCT: 36.2 % (ref 35.0–45.0)
Hemoglobin: 11.5 g/dL — ABNORMAL LOW (ref 11.7–15.5)
Lymphs Abs: 4575 cells/uL — ABNORMAL HIGH (ref 850–3900)
MCH: 24.9 pg — ABNORMAL LOW (ref 27.0–33.0)
MCHC: 31.8 g/dL — ABNORMAL LOW (ref 32.0–36.0)
MCV: 78.4 fL — ABNORMAL LOW (ref 80.0–100.0)
MPV: 11.4 fL (ref 7.5–12.5)
Monocytes Relative: 7.7 %
Neutro Abs: 6563 cells/uL (ref 1500–7800)
Neutrophils Relative %: 52.5 %
Platelets: 373 10*3/uL (ref 140–400)
RBC: 4.62 10*6/uL (ref 3.80–5.10)
RDW: 16.2 % — ABNORMAL HIGH (ref 11.0–15.0)
Total Lymphocyte: 36.6 %
WBC: 12.5 10*3/uL — ABNORMAL HIGH (ref 3.8–10.8)

## 2018-10-16 LAB — ANA: Anti Nuclear Antibody (ANA): NEGATIVE

## 2018-10-16 LAB — RHEUMATOID FACTOR: Rheumatoid fact SerPl-aCnc: 30 IU/mL — ABNORMAL HIGH (ref <14)

## 2018-10-16 LAB — CYCLIC CITRUL PEPTIDE ANTIBODY, IGG: Cyclic Citrullin Peptide Ab: 95 UNITS — ABNORMAL HIGH

## 2018-10-16 LAB — URIC ACID: Uric Acid, Serum: 7.1 mg/dL — ABNORMAL HIGH (ref 2.5–7.0)

## 2018-10-16 LAB — SEDIMENTATION RATE: Sed Rate: 31 mm/h — ABNORMAL HIGH (ref 0–30)

## 2018-10-20 ENCOUNTER — Other Ambulatory Visit: Payer: Self-pay

## 2018-10-20 ENCOUNTER — Other Ambulatory Visit (INDEPENDENT_AMBULATORY_CARE_PROVIDER_SITE_OTHER): Payer: Medicare Other

## 2018-10-20 ENCOUNTER — Telehealth: Payer: Self-pay | Admitting: Endocrinology

## 2018-10-20 DIAGNOSIS — E1165 Type 2 diabetes mellitus with hyperglycemia: Secondary | ICD-10-CM | POA: Diagnosis not present

## 2018-10-20 DIAGNOSIS — Z794 Long term (current) use of insulin: Secondary | ICD-10-CM | POA: Diagnosis not present

## 2018-10-20 LAB — BASIC METABOLIC PANEL
BUN: 14 mg/dL (ref 6–23)
CO2: 27 mEq/L (ref 19–32)
Calcium: 9.7 mg/dL (ref 8.4–10.5)
Chloride: 101 mEq/L (ref 96–112)
Creatinine, Ser: 0.98 mg/dL (ref 0.40–1.20)
GFR: 67.66 mL/min (ref >60.00)
Glucose, Bld: 174 mg/dL — ABNORMAL HIGH (ref 70–99)
Potassium: 3.7 mEq/L (ref 3.5–5.1)
Sodium: 137 mEq/L (ref 135–145)

## 2018-10-20 LAB — HEMOGLOBIN A1C: Hgb A1c MFr Bld: 7.8 % — ABNORMAL HIGH (ref 4.6–6.5)

## 2018-10-20 NOTE — Telephone Encounter (Signed)
Declined all virtual options for 10/23/2018

## 2018-10-23 ENCOUNTER — Encounter: Payer: Self-pay | Admitting: Endocrinology

## 2018-10-23 ENCOUNTER — Ambulatory Visit: Payer: Medicare Other | Admitting: Endocrinology

## 2018-10-23 ENCOUNTER — Other Ambulatory Visit: Payer: Self-pay

## 2018-10-23 VITALS — BP 142/70 | HR 84 | Ht 65.0 in | Wt 244.6 lb

## 2018-10-23 DIAGNOSIS — E1165 Type 2 diabetes mellitus with hyperglycemia: Secondary | ICD-10-CM | POA: Diagnosis not present

## 2018-10-23 DIAGNOSIS — Z794 Long term (current) use of insulin: Secondary | ICD-10-CM

## 2018-10-23 DIAGNOSIS — E1129 Type 2 diabetes mellitus with other diabetic kidney complication: Secondary | ICD-10-CM

## 2018-10-23 DIAGNOSIS — E039 Hypothyroidism, unspecified: Secondary | ICD-10-CM | POA: Diagnosis not present

## 2018-10-23 DIAGNOSIS — I10 Essential (primary) hypertension: Secondary | ICD-10-CM | POA: Diagnosis not present

## 2018-10-23 DIAGNOSIS — R809 Proteinuria, unspecified: Secondary | ICD-10-CM

## 2018-10-23 NOTE — Progress Notes (Signed)
Patient ID: Marissa Roberts, female   DOB: 1946/01/10, 73 y.o.   MRN: [id number]               Reason for Appointment:  Follow-up for diabetes and hypertension   History of Present Illness:             Type 2 diabetes mellitus, date of diagnosis: 2005       INSULIN regimen is  Tresiba 54 units am, Novolog  15-20 units bid- tid ac  Oral hypoglycemic drugs the patient is taking are: Metformin 2 g daily  Her  A1c is now about the same at 7.8, previously 7.9   Current blood sugar patterns, management and problems identified:  After her last visit she says she did start taking 20 units of NovoLog at dinnertime even though previously was not taking this  Not clear why her blood sugars are not any better overall  She keeps forgetting to check her sugars after meals since she does not remember to do it at 2-hour interval  She does not know why some her morning readings are higher  She does have 2 readings below 100 in the morning  Her lab glucose was 174 but her fasting the morning of the lab was 159  She does not adjust her mealtime dose based on her portion size or amount of carbohydrate  Also occasionally she may forget to take her NovoLog insulin before eating  She does not do any exercise and oriented on walking  Her weight is about the same     Side effects from medications have been:none  Compliance with the medical regimen: Fair  Glucose monitoring:  done 1 time a day         Glucometer:  One Touch.      Blood Glucose readings from review of monitor  FASTING blood sugar range 90-168 with AVERAGE 120  Previous AVERAGE 136    Self-care:   Meals: 3 meals per day. Breakfast is usually grits with or without eggs or meat Not always restricting fat intake or sweet tea  when eating out        Exercise:  Minimal  Dietician visit, most recent:2014.               Weight history:  Wt Readings from Last 3 Encounters:  10/23/18 244 lb 9.6 oz (110.9 kg)  09/18/18  243 lb (110.2 kg)  07/24/18 242 lb 3.2 oz (109.9 kg)    Glycemic control:   Lab Results  Component Value Date   HGBA1C 7.8 (H) 10/20/2018   HGBA1C 7.9 (H) 07/21/2018   HGBA1C 7.4 (H) 04/17/2018   Lab Results  Component Value Date   MICROALBUR 22.3 (H) 07/21/2018   LDLCALC 77 04/17/2018   CREATININE 0.98 10/20/2018   No results found for: FRUCTOSAMINE    Past history:  She thinks her blood sugars were only mildly increased at borderline levels at the onsetper Most likely she was treated with metformin initially and this has been continued.   At some point she was also tried on [place] and [place] for improving her control. She is not sure why she was started on insulin 5 years ago, presumably for worsening hyperglycemia Also not clear if she has tried various insulin regimens before starting Lantus and NovoLog  Previously had tried the V-go pump but subsequently she stopped doing the V-go pump because of the cost, skin irritation and local discomfort   Allergies as of 10/23/2018  Reactions   Contrast Media [iodinated Diagnostic Agents]    Throat swells from ct con tra   Adhesive [tape]    No paper tape, causes blisters   Erythromycin    REACTION: rash   Penicillins    REACTION: rash      Medication List       Accurate as of October 23, 2018  9:55 AM. If you have any questions, ask your nurse or doctor.        amLODipine 5 MG tablet Commonly known as: NORVASC Take 1 tablet (5 mg total) by mouth daily. What changed: when to take this   diclofenac 50 MG EC tablet Commonly known as: VOLTAREN Take 1 tablet (50 mg total) by mouth 2 (two) times daily for 14 days, THEN 1 tablet (50 mg total) daily for 14 days. Start taking on: October 14, 2018   fluticasone 50 MCG/ACT nasal spray Commonly known as: FLONASE SPRAY 2 SPRAYS INTO EACH NOSTRIL EVERY DAY   furosemide 40 MG tablet Commonly known as: LASIX Take 2 tablets by mouth once daily   glucose blood test  strip Commonly known as: OneTouch Verio PLEASE CHECK BLOOD SUGARS AT LEAST HALF THE TIME ABOUT 2 HOURS AFTER ANY MEAL AND 3 TIMES PER DAY ON WAKING UP   Insulin Degludec 200 UNIT/ML Sopn Commonly known as: [place] FlexTouch Inject 54 Units into the skin every morning.   Insulin Pen Needle 32G X 4 MM Misc Commonly known as: BD Pen Needle Nano U/F USE TO INJECT INSULIN 4 TIMES DAILY   levothyroxine 137 MCG tablet Commonly known as: SYNTHROID Take 1 tablet by mouth once daily   Linzess 290 MCG Caps capsule Generic drug: linaclotide Take 1 capsule (290 mcg total) by mouth daily.   metFORMIN 1000 MG tablet Commonly known as: GLUCOPHAGE TAKE 1 TABLET BY MOUTH TWICE DAILY WITH MEALS   montelukast 10 MG tablet Commonly known as: SINGULAIR Take 1 tablet (10 mg total) by mouth at bedtime.   NovoLOG FlexPen 100 UNIT/ML FlexPen Generic drug: insulin aspart INJECT UP TO 20 UNITS INTO THE SKIN BEFORE EACH MEAL   OneTouch Delica Lancets 73S Misc USE TO CHECK BLOOD SUGAR UP TO THREE TIMES A DAY 2 HOURS AFTER A MEAL AND UP TO 3 TIMES A WEEK UPON WAKING UP   potassium chloride 10 MEQ tablet Commonly known as: K-DUR Take 1 tablet (10 mEq total) by mouth 2 (two) times daily.   rosuvastatin 40 MG tablet Commonly known as: CRESTOR TAKE 1 TABLET BY MOUTH ONCE DAILY   valsartan-hydrochlorothiazide 320-12.5 MG tablet Commonly known as: DIOVAN-HCT Take 1 tablet by mouth daily.   Vitamin D3 1.25 MG (50000 UT) Caps Take 1 capsule by mouth once a week.       Allergies:  Allergies  Allergen Reactions  . Contrast Media [Iodinated Diagnostic Agents]     Throat swells from ct con tra  . Adhesive [Tape]     No paper tape, causes blisters  . Erythromycin     REACTION: rash  . Penicillins     REACTION: rash    Past Medical History:  Diagnosis Date  . Arthritis   . Bronchitis    hx of  . CHF (congestive heart failure) ([place])   . Diabetes mellitus   . GERD (gastroesophageal reflux  disease)   . Hypercholesteremia   . Hypertension   . Hypothyroidism   . Pneumonia    hx of  . PONV (postoperative nausea and vomiting)   .  Thyroid disease     Past Surgical History:  Procedure Laterality Date  . ABDOMINAL HYSTERECTOMY     partial  . BREAST SURGERY     lumpectomy left breast  . carpel tunnel    . COLONOSCOPY W/ POLYPECTOMY    . FOOT SURGERY    . knee arhtroscopy    . SHOULDER ARTHROSCOPY WITH SUBACROMIAL DECOMPRESSION, ROTATOR CUFF REPAIR AND BICEP TENDON REPAIR Right 05/26/2012   Procedure: Right Shoulder Diagnostic Operative Arthroscopy, Subacromial Decompression, Biceps Tenodesis, Mini Open Rotator Cuff Repair;  Surgeon: [name], MD;  Location: [facility];  Service: Orthopedics;  Laterality: Right;  Right Shoulder Diagnostic Operative Arthroscopy, Subacromial Decompression, Biceps Tenodesis, Mini Open Rotator Cuff Repair  . TONSILLECTOMY      Family History  Adopted: Yes  Problem Relation Age of Onset  . Diabetes Mother   . Heart disease Mother   . Hyperlipidemia Mother   . Coronary artery disease Other     Social History:  reports that she quit smoking about 28 years ago. She has never used smokeless tobacco. She reports that she does not drink alcohol or use drugs.    Review of Systems   HYPERTENSION: She is on amlodipine 5 mg twice daily and Diovan HCT 320/12.5  She was told to increase her Norvasc to 10 mg a day since she was having high blood pressure readings with 5 mg  Usually not checking blood pressure at home  BP Readings from Last 3 Encounters:  10/23/18 (!) 142/70  09/18/18 (!) 142/80  07/24/18 140/86    On Lasix 80mg  for edema daily and she may occasionally have edema She thinks she is had a little more swelling at times because of increasing her Norvasc  Muscle cramps: She has had muscle cramps in different areas including her neck, she says that she was trying to increase her salt intake and this may have helped, otherwise  has been avoiding salt including for her husband's cooking   LIPIDS:  She is taking rosuvastatin 40 mg, previously LDL was high with simvastatin 20 mg LDL below 100 Crestor prescribed by PCP      Lab Results  Component Value Date   CHOL 157 04/17/2018   HDL 51.00 04/17/2018   LDLCALC 77 04/17/2018   LDLDIRECT 129.5 02/06/2012   TRIG 145.0 04/17/2018   CHOLHDL 3 04/17/2018                  Thyroid:      She has been hypothyroid for over 40 years and is taking a supplement of 137 g levothyroxine, 6 tablets per week  TSH has been very consistently normal  Last labs:   Lab Results  Component Value Date   TSH 0.63 07/21/2018   TSH 1.95 10/10/2017   TSH 1.03 04/11/2017   FREET4 1.11 07/21/2018   FREET4 1.05 10/10/2017   FREET4 1.13 04/11/2017       Physical Examination:  BP (!) 142/70 (BP Location: Left Arm, Patient Position: Sitting, Cuff Size: Normal)   Pulse 84   Ht 5\' 5"  (1.651 m)   Wt 244 lb 9.6 oz (110.9 kg)   SpO2 97%   BMI 40.70 kg/m     ASSESSMENT/PLAN:  DIABETES on insulin:  See history of present illness for detailed discussion of current diabetes management, blood sugar patterns and problems identified She is on basal bolus insulin and metformin  A1c is still high at 7.8 Again higher than expected for her fasting blood sugar  readings at home These are averaging 120 over the last couple of weeks Discussed with the patient that she likely has periodic high readings after meals which need to be better controlled  Recommendations:  Check blood sugars in the mornings only 3 times a week  Check blood sugars during the day in between meals or at bedtime and may not need to time this 2 hours after meals  Reminded her to to make sure she tries to take her insulin before starting to eat preferably 15 minutes before  She will need to adjust her mealtime dose better with adjusting it for meal size and may take between 15-22 units at mealtimes  If her  blood sugars are consistently high over 200 she will need to add another 2 to 4 units for that particular meal  Encouraged her to try and exercise as much as possible  Cut back on sweet tea and any high-fat foods  HYPERTENSION with microalbuminuria: Blood pressure is relatively better with increasing Norvasc to 10 mg Her edema is usually controlled with using 80 mg Lasix For now we will continue the same regimen  May consider switching to diltiazem ER for benefits on her microalbuminuria We will recheck microalbumin on the next visit  HYPOTHYROIDISM: Again her TSH is consistently normal Muscle cramps: These are likely idiopathic but she can try magnesium since she is on diuretics  Total visit time for evaluation and management of multiple problems and counseling =25 minutes    There are no Patient Instructions on file for this visit.   Elayne Snare 10/23/2018, 9:55 AM   Note: This office note was prepared with Dragon voice recognition system technology. Any transcriptional errors that result from this process are unintentional.

## 2018-10-23 NOTE — Patient Instructions (Addendum)
Check blood sugars on waking up 3-4 days a week  Also check blood sugars about 2 hours after meals and do this after different meals by rotation  Recommended blood sugar levels on waking up are 90-130 and about 2 hours after meal is 130-180  Please bring your blood sugar monitor to each visit, thank you  Magnesium oxide 2 daily  Walk daily

## 2018-10-26 ENCOUNTER — Other Ambulatory Visit: Payer: Self-pay | Admitting: Endocrinology

## 2018-11-04 NOTE — Progress Notes (Deleted)
Office Visit Note  Patient: Marissa Roberts             Date of Birth: 13-Apr-1945           MRN: [id number]             PCP: [name], MD Referring: [name], MD Visit Date: 11/17/2018 Occupation: @GUAROCC @  Subjective:  No chief complaint on file.   History of Present Illness: [place] is a 73 y.o. female ***   Activities of Daily Living:  Patient reports morning stiffness for *** {minute/hour:19697}.   Patient {ACTIONS;DENIES/REPORTS:21021675::"Denies"} nocturnal pain.  Difficulty dressing/grooming: {ACTIONS;DENIES/REPORTS:21021675::"Denies"} Difficulty climbing stairs: {ACTIONS;DENIES/REPORTS:21021675::"Denies"} Difficulty getting out of chair: {ACTIONS;DENIES/REPORTS:21021675::"Denies"} Difficulty using hands for taps, buttons, cutlery, and/or writing: {ACTIONS;DENIES/REPORTS:21021675::"Denies"}  No Rheumatology ROS completed.   PMFS History:  Patient Active Problem List   Diagnosis Date Noted  . Fatigue 09/18/2018  . Routine general medical examination at a health care facility 02/07/2015  . Morbid obesity ([place]) 04/19/2013  . Hyperlipidemia associated with type 2 diabetes mellitus ([place]) 07/28/2009  . Osteopenia 03/14/2009  . Diabetes mellitus type 2 with complications ([place]) 87/56/4332  . Hypothyroidism 09/24/2006  . Essential hypertension 09/24/2006    Past Medical History:  Diagnosis Date  . Arthritis   . Bronchitis    hx of  . CHF (congestive heart failure) ([place])   . Diabetes mellitus   . GERD (gastroesophageal reflux disease)   . Hypercholesteremia   . Hypertension   . Hypothyroidism   . Pneumonia    hx of  . PONV (postoperative nausea and vomiting)   . Thyroid disease     Family History  Adopted: Yes  Problem Relation Age of Onset  . Diabetes Mother   . Heart disease Mother   . Hyperlipidemia Mother   . Coronary artery disease Other    Past Surgical History:  Procedure Laterality Date  . ABDOMINAL  HYSTERECTOMY     partial  . BREAST SURGERY     lumpectomy left breast  . carpel tunnel    . COLONOSCOPY W/ POLYPECTOMY    . FOOT SURGERY    . knee arhtroscopy    . SHOULDER ARTHROSCOPY WITH SUBACROMIAL DECOMPRESSION, ROTATOR CUFF REPAIR AND BICEP TENDON REPAIR Right 05/26/2012   Procedure: Right Shoulder Diagnostic Operative Arthroscopy, Subacromial Decompression, Biceps Tenodesis, Mini Open Rotator Cuff Repair;  Surgeon: [name], MD;  Location: [facility];  Service: Orthopedics;  Laterality: Right;  Right Shoulder Diagnostic Operative Arthroscopy, Subacromial Decompression, Biceps Tenodesis, Mini Open Rotator Cuff Repair  . TONSILLECTOMY     Social History   Social History Narrative  . Not on file   Immunization History  Administered Date(s) Administered  . Influenza Split 01/03/2011  . Influenza Whole 04/01/2005, 12/18/2009  . Influenza, High Dose Seasonal PF 01/07/2014, 01/27/2015, 01/02/2016, 01/13/2017, 01/16/2018  . Influenza-Unspecified 11/30/2012  . Pneumococcal Conjugate-13 09/12/2016  . Pneumococcal Polysaccharide-23 07/15/2014  . Td 04/01/2001, 10/07/2013     Objective: Vital Signs: There were no vitals taken for this visit.   Physical Exam   Musculoskeletal Exam: ***  CDAI Exam: CDAI Score: - Patient Global: -; Provider Global: - Swollen: -; Tender: - Joint Exam   No joint exam has been documented for this visit   There is currently no information documented on the homunculus. Go to the Rheumatology activity and complete the homunculus joint exam.  Investigation: Findings:  10/14/18: Sed rate 31, RF 30, CCP 95, Uric acid 7.1, ANA-  Component  Latest Ref Rng & Units 10/14/2018  Sed Rate     0 - 30 mm/h 31 (H)  RA Latex Turbid.     <14 IU/mL 30 (H)  Cyclic Citrullin Peptide Ab     UNITS 95 (H)  Uric Acid, Serum     2.5 - 7.0 mg/dL 7.1 (H)  Anti Nuclear Antibody (ANA)     NEGATIVE NEGATIVE   Imaging: Xr Hand Complete Right  Result Date:  10/14/2018 AP lateral oblique right hand reviewed.  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  No acute fracture or dislocation.  Mild degenerative changes present throughout the midcarpal and radiocarpal region.   Recent Labs: Lab Results  Component Value Date   WBC 12.5 (H) 10/14/2018   HGB 11.5 (L) 10/14/2018   PLT 373 10/14/2018   NA 137 10/20/2018   K 3.7 10/20/2018   CL 101 10/20/2018   CO2 27 10/20/2018   GLUCOSE 174 (H) 10/20/2018   BUN 14 10/20/2018   CREATININE 0.98 10/20/2018   BILITOT 0.3 04/17/2018   ALKPHOS 47 04/17/2018   AST 16 04/17/2018   ALT 15 04/17/2018   PROT 7.2 04/17/2018   ALBUMIN 4.0 04/17/2018   CALCIUM 9.7 10/20/2018   GFRAA >90 05/19/2012    Speciality Comments: No specialty comments available.  Procedures:  No procedures performed Allergies: Contrast media [iodinated diagnostic agents], Adhesive [tape], Erythromycin, and Penicillins   Assessment / Plan:     Visit Diagnoses: No diagnosis found.  Orders: No orders of the defined types were placed in this encounter.  No orders of the defined types were placed in this encounter.   Face-to-face time spent with patient was *** minutes. Greater than 50% of time was spent in counseling and coordination of care.  Follow-Up Instructions: No follow-ups on file.   Ofilia Neas, PA-C  Note - This record has been created using Dragon software.  Chart creation errors have been sought, but may not always  have been located. Such creation errors do not reflect on  the standard of medical care.

## 2018-11-07 ENCOUNTER — Other Ambulatory Visit: Payer: Self-pay | Admitting: Endocrinology

## 2018-11-10 ENCOUNTER — Other Ambulatory Visit: Payer: Self-pay | Admitting: Endocrinology

## 2018-11-10 ENCOUNTER — Other Ambulatory Visit: Payer: Self-pay

## 2018-11-10 ENCOUNTER — Other Ambulatory Visit: Payer: Self-pay | Admitting: *Deleted

## 2018-11-10 DIAGNOSIS — Z20822 Contact with and (suspected) exposure to covid-19: Secondary | ICD-10-CM

## 2018-11-10 MED ORDER — METFORMIN HCL 1000 MG PO TABS: 1000.0000 mg | ORAL_TABLET | Freq: Two times a day (BID) | ORAL | 0 refills | Status: DC

## 2018-11-11 ENCOUNTER — Other Ambulatory Visit: Payer: Self-pay

## 2018-11-13 LAB — NOVEL CORONAVIRUS, NAA: SARS-CoV-2, NAA: NOT DETECTED

## 2018-11-16 ENCOUNTER — Telehealth: Payer: Self-pay | Admitting: Orthopedic Surgery

## 2018-11-16 NOTE — Telephone Encounter (Signed)
Patient left a voicemail message stating that her appointment with Dr. Estanislado Pandy was cancelled due to her having the Covid-19 test.  She was advised that they would have to r/s her 4 weeks after her test.  She is having hand pain and wanted to know if Dr. Marlou Sa would call her in a RX for pain medication or would he refer her to another Rheumatologist.  CB#[phone].  Thank you.

## 2018-11-16 NOTE — Telephone Encounter (Signed)
Please review and advise. Thanks.  

## 2018-11-17 ENCOUNTER — Ambulatory Visit: Payer: Medicare Other | Admitting: Rheumatology

## 2018-11-17 NOTE — Telephone Encounter (Signed)
I called patient to make sure I had correct pharmacy. She already has tramadol at home and will try to take that and see if she gets some relief. New rx not called in today.

## 2018-11-17 NOTE — Telephone Encounter (Signed)
Ok for tramadol 1 po q 8 # 30 pls clal ahtx

## 2018-12-02 ENCOUNTER — Other Ambulatory Visit: Payer: Self-pay | Admitting: Endocrinology

## 2018-12-05 ENCOUNTER — Other Ambulatory Visit: Payer: Self-pay | Admitting: Endocrinology

## 2018-12-09 ENCOUNTER — Other Ambulatory Visit: Payer: Self-pay

## 2018-12-09 MED ORDER — ONETOUCH VERIO VI STRP: ORAL_STRIP | 1 refills | Status: DC

## 2018-12-15 ENCOUNTER — Ambulatory Visit: Payer: Medicare Other | Admitting: Rheumatology

## 2018-12-15 DIAGNOSIS — E119 Type 2 diabetes mellitus without complications: Secondary | ICD-10-CM | POA: Diagnosis not present

## 2018-12-15 LAB — HM DIABETES EYE EXAM

## 2018-12-16 NOTE — Progress Notes (Signed)
Office Visit Note  Patient: Marissa Roberts             Date of Birth: 04/09/1945           MRN: [id number]             PCP: [name], MD Referring: [name], MD Visit Date: 12/24/2018 Occupation: Retired, custodian at a school  Subjective:  Pain in both hands.   History of Present Illness: Marissa Roberts is a 73 y.o. female seen in consultation per request of Dr. Marlou Sa.  According to patient about 2 months ago she woke up with pain and swelling in her right hand.  She states because Dr. Marlou Sa had done surgery on her right shoulder joint she went to see him.  He did evaluation and did some blood work.  She was referred to me because she had positive rheumatoid factor and positive anti-CCP.  She states she was given some anti-inflammatories which did not help her at all and she continues to have pain and swelling in her right hand.  She has nocturnal pain and constant pain in her right hand.  She also has some discomfort in her left hand.  None of the other joints are as painful.  There is no family history of autoimmune disease.  Activities of Daily Living:  Patient reports morning stiffness for 2 hours.   Patient Reports nocturnal pain.  Difficulty dressing/grooming: Reports Difficulty climbing stairs: Denies Difficulty getting out of chair: Denies Difficulty using hands for taps, buttons, cutlery, and/or writing: Reports  Review of Systems  Constitutional: Positive for fatigue. Negative for night sweats, weight gain and weight loss.  HENT: Negative for mouth sores, trouble swallowing, trouble swallowing, mouth dryness and nose dryness.   Eyes: Negative for pain, redness, visual disturbance and dryness.  Respiratory: Negative for cough, shortness of breath and difficulty breathing.   Cardiovascular: Negative for chest pain, palpitations, hypertension, irregular heartbeat and swelling in legs/feet.  Gastrointestinal: Positive for constipation. Negative for  blood in stool and diarrhea.  Endocrine: Negative for increased urination.  Genitourinary: Negative for vaginal dryness.  Musculoskeletal: Positive for arthralgias, joint pain, joint swelling and morning stiffness. Negative for myalgias, muscle weakness, muscle tenderness and myalgias.  Skin: Negative for color change, rash, hair loss, skin tightness, ulcers and sensitivity to sunlight.  Allergic/Immunologic: Negative for susceptible to infections.  Neurological: Negative for dizziness, memory loss, night sweats and weakness.  Hematological: Negative for swollen glands.  Psychiatric/Behavioral: Negative for depressed mood and sleep disturbance. The patient is not nervous/anxious.     PMFS History:  Patient Active Problem List   Diagnosis Date Noted   Fatigue 09/18/2018   Routine general medical examination at a health care facility 02/07/2015   Morbid obesity ([place]) 04/19/2013   Hyperlipidemia associated with type 2 diabetes mellitus ([place]) 07/28/2009   Osteopenia 03/14/2009   Diabetes mellitus type 2 with complications ([place]) 0000000   Hypothyroidism 09/24/2006   Essential hypertension 09/24/2006    Past Medical History:  Diagnosis Date   Arthritis    Bronchitis    hx of   CHF (congestive heart failure) (HCC)    Diabetes mellitus    GERD (gastroesophageal reflux disease)    Hypercholesteremia    Hypertension    Hypothyroidism    Pneumonia    hx of   PONV (postoperative nausea and vomiting)    Thyroid disease     Family History  Problem Relation Age of Onset   Diabetes Mother  Heart disease Mother    Hyperlipidemia Mother    Diabetes Father    Diabetes Sister    Hypertension Sister    Heart disease Sister    Dementia Sister    Heart disease Brother    Healthy Daughter    Healthy Daughter    Healthy Daughter    Healthy Son    Past Surgical History:  Procedure Laterality Date   ABDOMINAL HYSTERECTOMY     partial   BREAST  SURGERY     lumpectomy left breast   carpel tunnel     COLONOSCOPY W/ POLYPECTOMY     FOOT SURGERY     knee arhtroscopy     SHOULDER ARTHROSCOPY WITH SUBACROMIAL DECOMPRESSION, ROTATOR CUFF REPAIR AND BICEP TENDON REPAIR Right 05/26/2012   Procedure: Right Shoulder Diagnostic Operative Arthroscopy, Subacromial Decompression, Biceps Tenodesis, Mini Open Rotator Cuff Repair;  Surgeon: [name], MD;  Location: [facility];  Service: Orthopedics;  Laterality: Right;  Right Shoulder Diagnostic Operative Arthroscopy, Subacromial Decompression, Biceps Tenodesis, Mini Open Rotator Cuff Repair   TONSILLECTOMY     Social History   Social History Narrative   Not on file   Immunization History  Administered Date(s) Administered   Influenza Split 01/03/2011   Influenza Whole 04/01/2005, 12/18/2009   Influenza, High Dose Seasonal PF 01/07/2014, 01/27/2015, 01/02/2016, 01/13/2017, 01/16/2018   Influenza-Unspecified 11/30/2012   Pneumococcal Conjugate-13 09/12/2016   Pneumococcal Polysaccharide-23 07/15/2014   Td 04/01/2001, 10/07/2013     Objective: Vital Signs: BP (!) 147/76 (BP Location: Right Arm, Patient Position: Sitting, Cuff Size: Large)    Pulse 78    Resp 15    Ht 5\' 5"  (1.651 m)    Wt 244 lb 6.4 oz (110.9 kg)    BMI 40.67 kg/m    Physical Exam Vitals signs and nursing note reviewed.  Constitutional:      Appearance: She is well-developed.  HENT:     Head: Normocephalic and atraumatic.  Eyes:     Conjunctiva/sclera: Conjunctivae normal.  Neck:     Musculoskeletal: Normal range of motion.  Cardiovascular:     Rate and Rhythm: Normal rate and regular rhythm.     Heart sounds: Normal heart sounds.  Pulmonary:     Effort: Pulmonary effort is normal.     Breath sounds: Normal breath sounds.  Abdominal:     General: Bowel sounds are normal.     Palpations: Abdomen is soft.  Lymphadenopathy:     Cervical: No cervical adenopathy.  Skin:    General: Skin is warm  and dry.     Capillary Refill: Capillary refill takes less than 2 seconds.  Neurological:     Mental Status: She is alert and oriented to person, place, and time.  Psychiatric:        Behavior: Behavior normal.      Musculoskeletal Exam: She is some stiffness with range of motion of her cervical spine.  She has discomfort range of motion of bilateral shoulders.  Elbow joints with good range of motion.  She has some synovitis over bilateral wrist joint and right wrist joint extensor tenosynovitis.  She has tenderness and swelling over some of the MCPs and PIPs as described below.  She had good range of motion of her hip joints, knee joints, ankle joints.  She has some swelling over her ankle joints which could be edema.  She has some tenderness across MTPs but no swelling was noted.  CDAI Exam: CDAI Score: 23.6  Patient Global: 8  mm; Provider Global: 8 mm Swollen: 4 ; Tender: 21  Joint Exam      Right  Left  Wrist  Swollen Tender   Tender  MCP 1   Tender     MCP 2  Swollen Tender  Swollen Tender  MCP 3  Swollen Tender   Tender  MCP 4   Tender   Tender  MCP 5   Tender     IP   Tender     PIP 2   Tender   Tender  PIP 3   Tender   Tender  PIP 4   Tender   Tender  PIP 5   Tender     MTP 2   Tender     MTP 4   Tender     MTP 5      Tender     Investigation: Findings:  10/14/18: ANA-, sed rate 31, RF 30, CCP 95, uric acid 7.1  Component     Latest Ref Rng & Units 10/14/2018  Sed Rate     0 - 30 mm/h 31 (H)  RA Latex Turbid.     <14 IU/mL 30 (H)  Cyclic Citrullin Peptide Ab     UNITS 95 (H)  Uric Acid, Serum     2.5 - 7.0 mg/dL 7.1 (H)  Anti Nuclear Antibody (ANA)     NEGATIVE NEGATIVE   Imaging: Xr Foot 2 Views Left  Result Date: 12/24/2018 First MTP, PIP and DIP narrowing was noted.  No erosive changes were noted.  No intertarsal joint space narrowing was noted.  Large inferior and posterior calcaneal spurs were noted. Impression: These findings are consistent with  osteoarthritis of the foot.  Xr Foot 2 Views Right  Result Date: 12/24/2018 Large inferior calcaneal spur was noted.  No tibiotalar or intertarsal joint space narrowing was noted.  First MTP, PIP and DIP narrowing was noted.  No erosive changes were noted. Impression: These findings are consistent with osteoarthritis of the foot.  Xr Hand 2 View Left  Result Date: 12/24/2018 [facility] narrowing was noted.  PIP and DIP narrowing was noted.  Juxta-articular osteopenia was noted.  Possible cystic versus erosive changes were noted in the second and third MCP joint. Impression: These findings are consistent with rheumatoid arthritis and osteoarthritis overlap.   Recent Labs: Lab Results  Component Value Date   WBC 12.5 (H) 10/14/2018   HGB 11.5 (L) 10/14/2018   PLT 373 10/14/2018   NA 137 10/20/2018   K 3.7 10/20/2018   CL 101 10/20/2018   CO2 27 10/20/2018   GLUCOSE 174 (H) 10/20/2018   BUN 14 10/20/2018   CREATININE 0.98 10/20/2018   BILITOT 0.3 04/17/2018   ALKPHOS 47 04/17/2018   AST 16 04/17/2018   ALT 15 04/17/2018   PROT 7.2 04/17/2018   ALBUMIN 4.0 04/17/2018   CALCIUM 9.7 10/20/2018   GFRAA >90 05/19/2012   February 2019-chest x-ray was unremarkable. Speciality Comments: No specialty comments available.  Procedures:  No procedures performed Allergies: Contrast media [iodinated diagnostic agents], Adhesive [tape], Erythromycin, and Penicillins   Assessment / Plan:     Visit Diagnoses: Rheumatoid arthritis involving multiple sites with positive rheumatoid factor (HCC)-patient had active synovitis in her wrist joints and MCP joints.  She also had multiple arthralgias.  She has severe pain and discomfort in her hands which she rates 8 on the scale of 0-10.  Detailed counseling guarding rheumatoid arthritis was provided.  Different treatment options and their side effects  were discussed.  She wants to try least aggressive medication.  I already have baseline CBC and CMP.  I will  start her on Plaquenil 200 mg p.o. twice daily.  Indications side effects contraindications were discussed at length.  If patient has any inadequate response to Plaquenil we may try more aggressive medication including methotrexate.  The plan is to obtain lab work in 1 month.  I will also reevaluate her situation in 1 month.  Due to her underlying diabetes she cannot take any steroids.  She will need baseline eye examination then yearly eye examination.  Patient was counseled on the purpose, proper use, and adverse effects of hydroxychloroquine including nausea/diarrhea, skin rash, headaches, and sun sensitivity.  Discussed importance of annual eye exams while on hydroxychloroquine to monitor to ocular toxicity and discussed importance of frequent laboratory monitoring.  Provided patient with eye exam form for baseline ophthalmologic exam.  Provided patient with educational materials on hydroxychloroquine and answered all questions.  Patient consented to hydroxychloroquine.  Will upload consent in the media tab.    Pain in both hands -she has pain and synovitis in bilateral hands.  10/14/18: ANA-, sed rate 31, RF 30, CCP 95, uric acid 7.1 - Plan: XR Hand 2 View Left knee x-ray showed juxta-articular osteopenia and possible erosive changes in the second and third MCP joint.  I also reviewed the x-ray of her right hand which showed possible erosion in the first MCP and juxta-articular osteopenia these findings are consistent with rheumatoid arthritis.  Pain in both feet -she has tenderness on palpation of bilateral MTPs.  Plan: XR Foot 2 Views Right, XR Foot 2 Views Left.  The x-ray of feet were consistent with osteoarthritis.  Chronic pain of both shoulders-she has discomfort range of motion of bilateral shoulders.  She had prior right shoulder joint surgery.  High risk medication use-we will obtain additional labs today in case she needs more aggressive therapy in the future.  Essential  hypertension-blood pressure is still elevated despite being on medications.  Hyperlipidemia associated with type 2 diabetes mellitus ([place])  Diabetes mellitus type 2 with complications ([place])  History of hypothyroidism  Osteopenia, unspecified location  Other fatigue  Orders: Orders Placed This Encounter  Procedures   XR Hand 2 View Left   XR Foot 2 Views Right   XR Foot 2 Views Left   Hepatitis B core antibody, IgM   Hepatitis B surface antigen   Hepatitis C antibody   QuantiFERON-TB Gold Plus   HIV Antibody (routine testing w rflx)   Serum protein electrophoresis with reflex   IgG, IgA, IgM   Glucose 6 phosphate dehydrogenase   Meds ordered this encounter  Medications   hydroxychloroquine (PLAQUENIL) 200 MG tablet    Sig: Take 1 tablet (200 mg total) by mouth 2 (two) times daily.    Dispense:  60 tablet    Refill:  0    Face-to-face time spent with patient was 60 minutes. Greater than 50% of time was spent in counseling and coordination of care.  Follow-Up Instructions: Return in about 4 weeks (around 01/21/2019) for Rheumatoid arthritis.   Bo Merino, MD  Note - This record has been created using Editor, commissioning.  Chart creation errors have been sought, but may not always  have been located. Such creation errors do not reflect on  the standard of medical care.

## 2018-12-18 ENCOUNTER — Other Ambulatory Visit: Payer: Self-pay | Admitting: Internal Medicine

## 2018-12-22 ENCOUNTER — Other Ambulatory Visit: Payer: Self-pay

## 2018-12-22 ENCOUNTER — Encounter: Payer: Self-pay | Admitting: Podiatry

## 2018-12-22 ENCOUNTER — Ambulatory Visit (INDEPENDENT_AMBULATORY_CARE_PROVIDER_SITE_OTHER): Payer: Medicare Other | Admitting: Podiatry

## 2018-12-22 DIAGNOSIS — M79675 Pain in left toe(s): Secondary | ICD-10-CM | POA: Diagnosis not present

## 2018-12-22 DIAGNOSIS — M79674 Pain in right toe(s): Secondary | ICD-10-CM | POA: Diagnosis not present

## 2018-12-22 DIAGNOSIS — E119 Type 2 diabetes mellitus without complications: Secondary | ICD-10-CM

## 2018-12-22 DIAGNOSIS — B351 Tinea unguium: Secondary | ICD-10-CM

## 2018-12-22 DIAGNOSIS — Z794 Long term (current) use of insulin: Secondary | ICD-10-CM | POA: Diagnosis not present

## 2018-12-22 DIAGNOSIS — L84 Corns and callosities: Secondary | ICD-10-CM | POA: Diagnosis not present

## 2018-12-22 NOTE — Patient Instructions (Signed)
Diabetes Mellitus and Foot Care Foot care is an important part of your health, especially when you have diabetes. Diabetes may cause you to have problems because of poor blood flow (circulation) to your feet and legs, which can cause your skin to:  Become thinner and drier.  Break more easily.  Heal more slowly.  Peel and crack. You may also have nerve damage (neuropathy) in your legs and feet, causing decreased feeling in them. This means that you may not notice minor injuries to your feet that could lead to more serious problems. Noticing and addressing any potential problems early is the best way to prevent future foot problems. How to care for your feet Foot hygiene  Wash your feet daily with warm water and mild soap. Do not use hot water. Then, pat your feet and the areas between your toes until they are completely dry. Do not soak your feet as this can dry your skin.  Trim your toenails straight across. Do not dig under them or around the cuticle. File the edges of your nails with an emery board or nail file.  Apply a moisturizing lotion or petroleum jelly to the skin on your feet and to dry, brittle toenails. Use lotion that does not contain alcohol and is unscented. Do not apply lotion between your toes. Shoes and socks  Wear clean socks or stockings every day. Make sure they are not too tight. Do not wear knee-high stockings since they may decrease blood flow to your legs.  Wear shoes that fit properly and have enough cushioning. Always look in your shoes before you put them on to be sure there are no objects inside.  To break in new shoes, wear them for just a few hours a day. This prevents injuries on your feet. Wounds, scrapes, corns, and calluses  Check your feet daily for blisters, cuts, bruises, sores, and redness. If you cannot see the bottom of your feet, use a mirror or ask someone for help.  Do not cut corns or calluses or try to remove them with medicine.  If you  find a minor scrape, cut, or break in the skin on your feet, keep it and the skin around it clean and dry. You may clean these areas with mild soap and water. Do not clean the area with peroxide, alcohol, or iodine.  If you have a wound, scrape, corn, or callus on your foot, look at it several times a day to make sure it is healing and not infected. Check for: ? Redness, swelling, or pain. ? Fluid or blood. ? Warmth. ? Pus or a bad smell. General instructions  Do not cross your legs. This may decrease blood flow to your feet.  Do not use heating pads or hot water bottles on your feet. They may burn your skin. If you have lost feeling in your feet or legs, you may not know this is happening until it is too late.  Protect your feet from hot and cold by wearing shoes, such as at the beach or on hot pavement.  Schedule a complete foot exam at least once a year (annually) or more often if you have foot problems. If you have foot problems, report any cuts, sores, or bruises to your health care provider immediately. Contact a health care provider if:  You have a medical condition that increases your risk of infection and you have any cuts, sores, or bruises on your feet.  You have an injury that is not   healing.  You have redness on your legs or feet.  You feel burning or tingling in your legs or feet.  You have pain or cramps in your legs and feet.  Your legs or feet are numb.  Your feet always feel cold.  You have pain around a toenail. Get help right away if:  You have a wound, scrape, corn, or callus on your foot and: ? You have pain, swelling, or redness that gets worse. ? You have fluid or blood coming from the wound, scrape, corn, or callus. ? Your wound, scrape, corn, or callus feels warm to the touch. ? You have pus or a bad smell coming from the wound, scrape, corn, or callus. ? You have a fever. ? You have a red line going up your leg. Summary  Check your feet every day  for cuts, sores, red spots, swelling, and blisters.  Moisturize feet and legs daily.  Wear shoes that fit properly and have enough cushioning.  If you have foot problems, report any cuts, sores, or bruises to your health care provider immediately.  Schedule a complete foot exam at least once a year (annually) or more often if you have foot problems. This information is not intended to replace advice given to you by your health care provider. Make sure you discuss any questions you have with your health care provider. Document Released: 03/15/2000 Document Revised: 04/30/2017 Document Reviewed: 04/19/2016 Elsevier Patient Education  2020 [employer]  Corns and Calluses Corns are small areas of thickened skin that occur on the top, sides, or tip of a toe. They contain a cone-shaped core with a point that can press on a nerve below. This causes pain.  Calluses are areas of thickened skin that can occur anywhere on the body, including the hands, fingers, palms, soles of the feet, and heels. Calluses are usually larger than corns. What are the causes? Corns and calluses are caused by rubbing (friction) or pressure, such as from shoes that are too tight or do not fit properly. What increases the risk? Corns are more likely to develop in people who have misshapen toes (toe deformities), such as hammer toes. Calluses can occur with friction to any area of the skin. They are more likely to develop in people who:  Work with their hands.  Wear shoes that fit poorly, are too tight, or are high-heeled.  Have toe deformities. What are the signs or symptoms? Symptoms of a corn or callus include:  A hard growth on the skin.  Pain or tenderness under the skin.  Redness and swelling.  Increased discomfort while wearing tight-fitting shoes, if your feet are affected. If a corn or callus becomes infected, symptoms may include:  Redness and swelling that gets worse.  Pain.  Fluid, blood, or  pus draining from the corn or callus. How is this diagnosed? Corns and calluses may be diagnosed based on your symptoms, your medical history, and a physical exam. How is this treated? Treatment for corns and calluses may include:  Removing the cause of the friction or pressure. This may involve: ? Changing your shoes. ? Wearing shoe inserts (orthotics) or other protective layers in your shoes, such as a corn pad. ? Wearing gloves.  Applying medicine to the skin (topical medicine) to help soften skin in the hardened, thickened areas.  Removing layers of dead skin with a file to reduce the size of the corn or callus.  Removing the corn or callus with a scalpel or   laser.  Taking antibiotic medicines, if your corn or callus is infected.  Having surgery, if a toe deformity is the cause. Follow these instructions at home:   Take over-the-counter and prescription medicines only as told by your health care provider.  If you were prescribed an antibiotic, take it as told by your health care provider. Do not stop taking it even if your condition starts to improve.  Wear shoes that fit well. Avoid wearing high-heeled shoes and shoes that are too tight or too loose.  Wear any padding, protective layers, gloves, or orthotics as told by your health care provider.  Soak your hands or feet and then use a file or pumice stone to soften your corn or callus. Do this as told by your health care provider.  Check your corn or callus every day for symptoms of infection. Contact a health care provider if you:  Notice that your symptoms do not improve with treatment.  Have redness or swelling that gets worse.  Notice that your corn or callus becomes painful.  Have fluid, blood, or pus coming from your corn or callus.  Have new symptoms. Summary  Corns are small areas of thickened skin that occur on the top, sides, or tip of a toe.  Calluses are areas of thickened skin that can occur anywhere  on the body, including the hands, fingers, palms, and soles of the feet. Calluses are usually larger than corns.  Corns and calluses are caused by rubbing (friction) or pressure, such as from shoes that are too tight or do not fit properly.  Treatment may include wearing any padding, protective layers, gloves, or orthotics as told by your health care provider. This information is not intended to replace advice given to you by your health care provider. Make sure you discuss any questions you have with your health care provider. Document Released: 12/23/2003 Document Revised: 07/08/2018 Document Reviewed: 01/29/2017 Elsevier Patient Education  2020 [employer] Inc.  Onychomycosis/Fungal Toenails  WHAT IS IT? An infection that lies within the keratin of your nail plate that is caused by a fungus.  WHY ME? Fungal infections affect all ages, sexes, races, and creeds.  There may be many factors that predispose you to a fungal infection such as age, coexisting medical conditions such as diabetes, or an autoimmune disease; stress, medications, fatigue, genetics, etc.  Bottom line: fungus thrives in a warm, moist environment and your shoes offer such a location.  IS IT CONTAGIOUS? Theoretically, yes.  You do not want to share shoes, nail clippers or files with someone who has fungal toenails.  Walking around barefoot in the same room or sleeping in the same bed is unlikely to transfer the organism.  It is important to realize, however, that fungus can spread easily from one nail to the next on the same foot.  HOW DO WE TREAT THIS?  There are several ways to treat this condition.  Treatment may depend on many factors such as age, medications, pregnancy, liver and kidney conditions, etc.  It is best to ask your doctor which options are available to you.  1. No treatment.   Unlike many other medical concerns, you can live with this condition.  However for many people this can be a painful condition and may  lead to ingrown toenails or a bacterial infection.  It is recommended that you keep the nails cut short to help reduce the amount of fungal nail. 2. Topical treatment.  These range from herbal remedies to prescription   strength nail lacquers.  About 40-50% effective, topicals require twice daily application for approximately 9 to 12 months or until an entirely new nail has grown out.  The most effective topicals are medical grade medications available through physicians offices. 3. Oral antifungal medications.  With an 80-90% cure rate, the most common oral medication requires 3 to 4 months of therapy and stays in your system for a year as the new nail grows out.  Oral antifungal medications do require blood work to make sure it is a safe drug for you.  A liver function panel will be performed prior to starting the medication and after the first month of treatment.  It is important to have the blood work performed to avoid any harmful side effects.  In general, this medication safe but blood work is required. 4. Laser Therapy.  This treatment is performed by applying a specialized laser to the affected nail plate.  This therapy is noninvasive, fast, and non-painful.  It is not covered by insurance and is therefore, out of pocket.  The results have been very good with a 80-95% cure rate.  [facility] is the only practice in the area to offer this therapy. 5. Permanent Nail Avulsion.  Removing the entire nail so that a new nail will not grow back. 

## 2018-12-24 ENCOUNTER — Ambulatory Visit: Payer: Self-pay

## 2018-12-24 ENCOUNTER — Other Ambulatory Visit: Payer: Self-pay

## 2018-12-24 ENCOUNTER — Encounter: Payer: Self-pay | Admitting: Rheumatology

## 2018-12-24 ENCOUNTER — Ambulatory Visit: Payer: Medicare Other | Admitting: Rheumatology

## 2018-12-24 VITALS — BP 147/76 | HR 78 | Resp 15 | Ht 65.0 in | Wt 244.4 lb

## 2018-12-24 DIAGNOSIS — M79641 Pain in right hand: Secondary | ICD-10-CM | POA: Diagnosis not present

## 2018-12-24 DIAGNOSIS — M25511 Pain in right shoulder: Secondary | ICD-10-CM | POA: Diagnosis not present

## 2018-12-24 DIAGNOSIS — Z79899 Other long term (current) drug therapy: Secondary | ICD-10-CM | POA: Diagnosis not present

## 2018-12-24 DIAGNOSIS — M79672 Pain in left foot: Secondary | ICD-10-CM

## 2018-12-24 DIAGNOSIS — M25512 Pain in left shoulder: Secondary | ICD-10-CM

## 2018-12-24 DIAGNOSIS — M0579 Rheumatoid arthritis with rheumatoid factor of multiple sites without organ or systems involvement: Secondary | ICD-10-CM

## 2018-12-24 DIAGNOSIS — M79642 Pain in left hand: Secondary | ICD-10-CM | POA: Diagnosis not present

## 2018-12-24 DIAGNOSIS — I10 Essential (primary) hypertension: Secondary | ICD-10-CM

## 2018-12-24 DIAGNOSIS — Z8639 Personal history of other endocrine, nutritional and metabolic disease: Secondary | ICD-10-CM

## 2018-12-24 DIAGNOSIS — M79671 Pain in right foot: Secondary | ICD-10-CM

## 2018-12-24 DIAGNOSIS — E118 Type 2 diabetes mellitus with unspecified complications: Secondary | ICD-10-CM

## 2018-12-24 DIAGNOSIS — E785 Hyperlipidemia, unspecified: Secondary | ICD-10-CM

## 2018-12-24 DIAGNOSIS — Z5181 Encounter for therapeutic drug level monitoring: Secondary | ICD-10-CM | POA: Diagnosis not present

## 2018-12-24 DIAGNOSIS — E1169 Type 2 diabetes mellitus with other specified complication: Secondary | ICD-10-CM

## 2018-12-24 DIAGNOSIS — Z111 Encounter for screening for respiratory tuberculosis: Secondary | ICD-10-CM | POA: Diagnosis not present

## 2018-12-24 DIAGNOSIS — G8929 Other chronic pain: Secondary | ICD-10-CM

## 2018-12-24 DIAGNOSIS — R5383 Other fatigue: Secondary | ICD-10-CM

## 2018-12-24 DIAGNOSIS — M8580 Other specified disorders of bone density and structure, unspecified site: Secondary | ICD-10-CM

## 2018-12-24 MED ORDER — HYDROXYCHLOROQUINE SULFATE 200 MG PO TABS: 200.0000 mg | ORAL_TABLET | Freq: Two times a day (BID) | ORAL | 0 refills | Status: DC

## 2018-12-24 NOTE — Patient Instructions (Signed)
Hydroxychloroquine tablets What is this medicine? HYDROXYCHLOROQUINE (hye drox ee KLOR oh kwin) is used to treat rheumatoid arthritis and systemic lupus erythematosus. It is also used to treat malaria. This medicine may be used for other purposes; ask your health care provider or pharmacist if you have questions. COMMON BRAND NAME(S): Plaquenil, Quineprox What should I tell my health care provider before I take this medicine? They need to know if you have any of these conditions:  diabetes  eye disease, vision problems  G6PD deficiency  heart disease  history of irregular heartbeat  if you often drink alcohol  kidney disease  liver disease  porphyria  psoriasis  an unusual or allergic reaction to chloroquine, hydroxychloroquine, other medicines, foods, dyes, or preservatives  pregnant or trying to get pregnant  breast-feeding How should I use this medicine? Take this medicine by mouth with a glass of water. Follow the directions on the prescription label. Do not cut, crush or chew this medicine. Swallow the tablets whole. Take this medicine with food. Avoid taking antacids within 4 hours of taking this medicine. It is best to separate these medicines by at least 4 hours. Take your medicine at regular intervals. Do not take it more often than directed. Take all of your medicine as directed even if you think you are better. Do not skip doses or stop your medicine early. Talk to your pediatrician regarding the use of this medicine in children. While this drug may be prescribed for selected conditions, precautions do apply. Overdosage: If you think you have taken too much of this medicine contact a poison control center or emergency room at once. NOTE: This medicine is only for you. Do not share this medicine with others. What if I miss a dose? If you miss a dose, take it as soon as you can. If it is almost time for your next dose, take only that dose. Do not take double or extra  doses. What may interact with this medicine? Do not take this medicine with any of the following medications:  cisapride  dronedarone  pimozide  thioridazine This medicine may also interact with the following medications:  ampicillin  antacids  cimetidine  cyclosporine  digoxin  kaolin  medicines for diabetes, like insulin, glipizide, glyburide  medicines for seizures like carbamazepine, phenobarbital, phenytoin  mefloquine  methotrexate  other medicines that prolong the QT interval (cause an abnormal heart rhythm)  praziquantel This list may not describe all possible interactions. Give your health care provider a list of all the medicines, herbs, non-prescription drugs, or dietary supplements you use. Also tell them if you smoke, drink alcohol, or use illegal drugs. Some items may interact with your medicine. What should I watch for while using this medicine? Visit your health care professional for regular checks on your progress. Tell your health care professional if your symptoms do not start to get better or if they get worse. You may need blood work done while you are taking this medicine. If you take other medicines that can affect heart rhythm, you may need more testing. Talk to your health care professional if you have questions. Your vision may be tested before and during use of this medicine. Tell your health care professional right away if you have any change in your eyesight. What side effects may I notice from receiving this medicine? Side effects that you should report to your doctor or health care professional as soon as possible:  allergic reactions like skin rash, itching or hives,   swelling of the face, lips, or tongue  changes in vision  decreased hearing or ringing of the ears  muscle weakness  redness, blistering, peeling or loosening of the skin, including inside the mouth  sensitivity to light  signs and symptoms of a dangerous change in  heartbeat or heart rhythm like chest pain; dizziness; fast or irregular heartbeat; palpitations; feeling faint or lightheaded, falls; breathing problems  signs and symptoms of liver injury like dark yellow or brown urine; general ill feeling or flu-like symptoms; light-colored stools; loss of appetite; nausea; right upper belly pain; unusually weak or tired; yellowing of the eyes or skin  signs and symptoms of low blood sugar such as feeling anxious; confusion; dizziness; increased hunger; unusually weak or tired; sweating; shakiness; cold; irritable; headache; blurred vision; fast heartbeat; loss of consciousness  suicidal thoughts  uncontrollable head, mouth, neck, arm, or leg movements Side effects that usually do not require medical attention (report to your doctor or health care professional if they continue or are bothersome):  diarrhea  dizziness  hair loss  headache  irritable  loss of appetite  nausea, vomiting  stomach pain This list may not describe all possible side effects. Call your doctor for medical advice about side effects. You may report side effects to FDA at 1-800-FDA-1088. Where should I keep my medicine? Keep out of the reach of children. Store at room temperature between 15 and 30 degrees C (59 and 86 degrees F). Protect from moisture and light. Throw away any unused medicine after the expiration date. NOTE: This sheet is a summary. It may not cover all possible information. If you have questions about this medicine, talk to your doctor, pharmacist, or health care provider.  2020 Elsevier/Gold Standard (2018-07-27 12:56:32)  

## 2018-12-27 NOTE — Progress Notes (Signed)
Subjective: Marissa Roberts is seen today for follow up for preventative diabetic foot care with painful corns and elongated, thickened toenails 1-5 b/l feet that she cannot cut. Pain interferes with daily activities. Aggravating factor includes wearing enclosed shoe gear and relieved with periodic debridement.  Current Outpatient Medications on File Prior to Visit  Medication Sig  . amLODipine (NORVASC) 5 MG tablet Take 1 tablet (5 mg total) by mouth daily. (Patient taking differently: Take 5 mg by mouth 2 (two) times daily. )  . calcium carbonate (OS-CAL) 600 MG TABS tablet Take by mouth.  . Cholecalciferol (VITAMIN D3) 1.25 MG (50000 UT) CAPS Take 1 capsule by mouth once a week.  . fluticasone (FLONASE) 50 MCG/ACT nasal spray SPRAY 2 SPRAYS INTO EACH NOSTRIL EVERY DAY (Patient taking differently: as needed. )  . furosemide (LASIX) 40 MG tablet Take 2 tablets by mouth once daily  . glucose blood (ONETOUCH VERIO) test strip Use Onetouch verio test strips as instructed to check blood sugars 4 times daily. DX:E11.65  . Insulin Pen Needle (BD PEN NEEDLE NANO U/F) 32G X 4 MM MISC USE TO INJECT INSULIN 4 TIMES DAILY  . levothyroxine (SYNTHROID) 137 MCG tablet Take 1 tablet by mouth once daily  . LINZESS 290 MCG CAPS capsule Take 1 capsule (290 mcg total) by mouth daily. (Patient taking differently: Take 290 mcg by mouth as needed. )  . metFORMIN (GLUCOPHAGE) 1000 MG tablet Take 1 tablet (1,000 mg total) by mouth 2 (two) times daily with a meal.  . montelukast (SINGULAIR) 10 MG tablet Take 1 tablet (10 mg total) by mouth at bedtime.  Marland Kitchen NOVOLOG FLEXPEN 100 UNIT/ML FlexPen Inject 15-22 units under the skin three times daily before meals. (Patient taking differently: Inject 20 units under the skin three times daily before meals.)  . OneTouch Delica Lancets [id number] MISC USE TO CHECK BLOOD SUGAR UP TO THREE TIMES A DAY 2 HOURS AFTER A MEAL AND UP TO 3 TIMES A WEEK UPON WAKING UP  . potassium chloride (K-DUR)  10 MEQ tablet Take 1 tablet (10 mEq total) by mouth 2 (two) times daily.  . rosuvastatin (CRESTOR) 40 MG tablet Take 1 tablet by mouth once daily  . TRESIBA FLEXTOUCH 200 UNIT/ML SOPN INJECT 54 UNITS SUBCUTANEOUSLY IN THE MORNING  . valsartan-hydrochlorothiazide (DIOVAN-HCT) 320-12.5 MG tablet Take 1 tablet by mouth daily.  . [DISCONTINUED] Saxagliptin-Metformin 07-998 MG TB24 Take 1 tablet by mouth daily.   No current facility-administered medications on file prior to visit.      Allergies  Allergen Reactions  . Contrast Media [Iodinated Diagnostic Agents]     Throat swells from ct con tra  . Adhesive [Tape]     No paper tape, causes blisters  . Erythromycin     REACTION: rash  . Penicillins     REACTION: rash   Objective:  Vascular Examination: Capillary refill time <3 seconds x 10 digits.  Dorsalis pedis and Posterior tibial pulses present b/l.  Digital hair present x 10 digits.  Skin temperature gradient WNL b/l.   Dermatological Examination: Skin with normal turgor, texture and tone b/l.  Toenails 1-5 b/l discolored, thick, dystrophic with subungual debris and pain with palpation to nailbeds due to thickness of nails.  Hyperkeratotic lesion noted dorsal 5th digit PIPJ with tenderness to palpation. No edema, no erythema, no drainage, no flocculence.  Musculoskeletal: Muscle strength 5/5 to all LE muscle groups.  Hammertoes b/l 5th digits.  No pain, crepitus or joint limitation noted with ROM.  Neurological Examination: Protective sensation intact with 10 gram monofilament bilaterally.  Epicritic sensation present bilaterally.  Vibratory sensation intact bilaterally.   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 Toenails 1-5 b/l were debrided in length and girth without iatrogenic bleeding.  2. Corns b/l 5th digits pared utilizing sterile scalpel blade without incident. 3. Patient to continue soft, supportive  shoe gear. 4. Patient to report any pedal injuries to medical professional immediately. 5. Follow up 3 months.  6. Patient/POA to call should there be a concern in the interim.

## 2018-12-29 LAB — QUANTIFERON-TB GOLD PLUS
Mitogen-NIL: >10 IU/mL
NIL: 0.03 IU/mL
QuantiFERON-TB Gold Plus: NEGATIVE
TB1-NIL: <0 IU/mL
TB2-NIL: <0 IU/mL

## 2018-12-29 LAB — HEPATITIS C ANTIBODY
Hepatitis C Ab: NONREACTIVE
SIGNAL TO CUT-OFF: 0.08 (ref <1.00)

## 2018-12-29 LAB — PROTEIN ELECTROPHORESIS, SERUM, WITH REFLEX
Albumin ELP: 3.7 g/dL — ABNORMAL LOW (ref 3.8–4.8)
Alpha 1: 0.4 g/dL — ABNORMAL HIGH (ref 0.2–0.3)
Alpha 2: 1 g/dL — ABNORMAL HIGH (ref 0.5–0.9)
Beta 2: 0.5 g/dL (ref 0.2–0.5)
Beta Globulin: 0.5 g/dL (ref 0.4–0.6)
Gamma Globulin: 0.9 g/dL (ref 0.8–1.7)
Total Protein: 7 g/dL (ref 6.1–8.1)

## 2018-12-29 LAB — IGG, IGA, IGM
IgG (Immunoglobin G), Serum: 859 mg/dL (ref 600–1540)
IgM, Serum: 175 mg/dL (ref 50–300)
Immunoglobulin A: 260 mg/dL (ref 70–320)

## 2018-12-29 LAB — HEPATITIS B SURFACE ANTIGEN: Hepatitis B Surface Ag: NONREACTIVE

## 2018-12-29 LAB — HEPATITIS B CORE ANTIBODY, IGM: Hep B C IgM: NONREACTIVE

## 2018-12-29 LAB — HIV ANTIBODY (ROUTINE TESTING W REFLEX): HIV 1&2 Ab, 4th Generation: NONREACTIVE

## 2018-12-29 LAB — IFE INTERPRETATION: Immunofix Electr Int: NOT DETECTED

## 2018-12-29 LAB — GLUCOSE 6 PHOSPHATE DEHYDROGENASE: G-6PDH: 16.5 U/g Hgb (ref 7.0–20.5)

## 2019-01-01 ENCOUNTER — Other Ambulatory Visit: Payer: Self-pay | Admitting: Endocrinology

## 2019-01-05 ENCOUNTER — Telehealth: Payer: Self-pay | Admitting: Rheumatology

## 2019-01-05 NOTE — Telephone Encounter (Signed)
Patient still having pain with right arm. Patient wants to know if this is normal. Patient has been on Plaquenil since 12/24/2018. Does she continue with medication? Is there something she can take for pain? Please call to discuss with patient.

## 2019-01-05 NOTE — Telephone Encounter (Signed)
Patient advised that she should continue PLQ. Patient advised it takes 12 weeks to see effects from medications. Patient advised if she were to take Tylenol not to take it on a daily basis. Patient advised to contact the office if her pain increased. Patient verbalized understanding.

## 2019-01-14 NOTE — Progress Notes (Signed)
Office Visit Note  Patient: Marissa Roberts             Date of Birth: 15-Aug-1945           MRN: [id number]             PCP: [name], MD Referring: [name], * Visit Date: 01/28/2019 Occupation: _0 @  Subjective:  Medication monitoring     History of Present Illness: ANDREIA GANDOLFI is a 73 y.o. female with history of seropositive rheumatoid arthritis and osteoarthritis.  She is taking Plaquenil 200 mg 1 tablet by mouth twice daily.    She has taken 1 month of Plaquenil has been tolerating it without any side effects.  She needs a refill of Plaquenil today.  She states that her joint pain has almost completely resolved since starting on Plaquenil.  She still has some discomfort in the right first MCP joint first thing in the morning.  She denies any morning stiffness.     Activities of Daily Living:  Patient reports morning stiffness for 0 minutes.   Patient Denies nocturnal pain.  Difficulty dressing/grooming: Denies Difficulty climbing stairs: Denies Difficulty getting out of chair: Denies Difficulty using hands for taps, buttons, cutlery, and/or writing: Denies  Review of Systems  Constitutional: Negative for fatigue.  HENT: Negative for mouth sores, mouth dryness and nose dryness.   Eyes: Negative for pain, itching, visual disturbance and dryness.  Respiratory: Negative for cough, hemoptysis, shortness of breath, wheezing and difficulty breathing.   Cardiovascular: Negative for chest pain, palpitations, hypertension and swelling in legs/feet.  Gastrointestinal: Negative for blood in stool, constipation and diarrhea.  Endocrine: Negative for increased urination.  Genitourinary: Negative for difficulty urinating and painful urination.  Musculoskeletal: Positive for arthralgias and joint pain. Negative for joint swelling, myalgias, muscle weakness, morning stiffness, muscle tenderness and myalgias.  Skin: Negative for color change, pallor,  rash, hair loss, nodules/bumps, skin tightness, ulcers and sensitivity to sunlight.  Allergic/Immunologic: Negative for susceptible to infections.  Neurological: Negative for dizziness, light-headedness, numbness, headaches, memory loss and weakness.  Hematological: Negative for bruising/bleeding tendency and swollen glands.  Psychiatric/Behavioral: Negative for depressed mood, confusion and sleep disturbance. The patient is not nervous/anxious.     PMFS History:  Patient Active Problem List   Diagnosis Date Noted  . Rheumatoid arthritis with rheumatoid factor of multiple sites without organ or systems involvement ([place]) 01/25/2019  . High risk medication use 01/25/2019  . Fatigue 09/18/2018  . Routine general medical examination at a health care facility 02/07/2015  . Morbid obesity ([place]) 04/19/2013  . Hyperlipidemia associated with type 2 diabetes mellitus ([place]) 07/28/2009  . Osteopenia 03/14/2009  . Diabetes mellitus type 2 with complications ([place]) 86/76/1950  . Hypothyroidism 09/24/2006  . Essential hypertension 09/24/2006    Past Medical History:  Diagnosis Date  . Arthritis   . Bronchitis    hx of  . CHF (congestive heart failure) ([place])   . Diabetes mellitus   . GERD (gastroesophageal reflux disease)   . Hypercholesteremia   . Hypertension   . Hypothyroidism   . Pneumonia    hx of  . PONV (postoperative nausea and vomiting)   . Thyroid disease     Family History  Problem Relation Age of Onset  . Diabetes Mother   . Heart disease Mother   . Hyperlipidemia Mother   . Diabetes Father   . Diabetes Sister   . Hypertension Sister   . Heart disease Sister   .  Dementia Sister   . Heart disease Brother   . Healthy Daughter   . Healthy Daughter   . Healthy Daughter   . Healthy Son    Past Surgical History:  Procedure Laterality Date  . ABDOMINAL HYSTERECTOMY     partial  . BREAST SURGERY     lumpectomy left breast  . carpel tunnel    . COLONOSCOPY W/ POLYPECTOMY     . FOOT SURGERY    . knee arhtroscopy    . SHOULDER ARTHROSCOPY WITH SUBACROMIAL DECOMPRESSION, ROTATOR CUFF REPAIR AND BICEP TENDON REPAIR Right 05/26/2012   Procedure: Right Shoulder Diagnostic Operative Arthroscopy, Subacromial Decompression, Biceps Tenodesis, Mini Open Rotator Cuff Repair;  Surgeon: [name], MD;  Location: [facility];  Service: Orthopedics;  Laterality: Right;  Right Shoulder Diagnostic Operative Arthroscopy, Subacromial Decompression, Biceps Tenodesis, Mini Open Rotator Cuff Repair  . TONSILLECTOMY     Social History   Social History Narrative  . Not on file   Immunization History  Administered Date(s) Administered  . Fluad Quad(high Dose 65+) 01/02/2019  . Influenza Split 01/03/2011  . Influenza Whole 04/01/2005, 12/18/2009  . Influenza, High Dose Seasonal PF 01/07/2014, 01/27/2015, 01/02/2016, 01/13/2017, 01/16/2018  . Influenza-Unspecified 11/30/2012  . Pneumococcal Conjugate-13 09/12/2016, 01/02/2019  . Pneumococcal Polysaccharide-23 07/15/2014  . Td 04/01/2001, 10/07/2013     Objective: Vital Signs: BP 138/74 (BP Location: Left Arm, Patient Position: Sitting, Cuff Size: Large)   Pulse 73   Resp 14   Ht 5' 5" (1.651 m)   Wt 240 lb (108.9 kg)   BMI 39.94 kg/m    Physical Exam Vitals signs and nursing note reviewed.  Constitutional:      Appearance: She is well-developed.  HENT:     Head: Normocephalic and atraumatic.  Eyes:     Conjunctiva/sclera: Conjunctivae normal.  Neck:     Musculoskeletal: Normal range of motion.  Cardiovascular:     Rate and Rhythm: Normal rate and regular rhythm.     Heart sounds: Normal heart sounds.  Pulmonary:     Effort: Pulmonary effort is normal.     Breath sounds: Normal breath sounds.  Abdominal:     General: Bowel sounds are normal.     Palpations: Abdomen is soft.  Lymphadenopathy:     Cervical: No cervical adenopathy.  Skin:    General: Skin is warm and dry.     Capillary Refill: Capillary  refill takes less than 2 seconds.  Neurological:     Mental Status: She is alert and oriented to person, place, and time.  Psychiatric:        Behavior: Behavior normal.      Musculoskeletal Exam: C-spine slightly limited range of motion with lateral rotation.  Thoracic lumbar spine good range of motion  Shoulder joints and elbow joints have good range of motion with no discomfort.  Slightly limited range of motion of the right wrist joint with some discomfort.  Hip joints, knee joints, ankle joints, MTPs, PIPs and DIPs good range of motion no synovitis.  No warmth or effusion of bilateral knee joints.  No tenderness or swelling of ankle joints.  She has pedal edema bilaterally.  CDAI Exam: CDAI Score: 0.4  Patient Global: 2 mm; Provider Global: 2 mm Swollen: 0 ; Tender: 0  Joint Exam   No joint exam has been documented for this visit   There is currently no information documented on the homunculus. Go to the Rheumatology activity and complete the homunculus joint exam.  Investigation:  No additional findings.  Imaging: No results found.  Recent Labs: Lab Results  Component Value Date   WBC 12.5 (H) 10/14/2018   HGB 11.5 (L) 10/14/2018   PLT 373 10/14/2018   NA 140 01/22/2019   K 4.0 01/22/2019   CL 103 01/22/2019   CO2 27 01/22/2019   GLUCOSE 80 01/22/2019   BUN 11 01/22/2019   CREATININE 0.86 01/22/2019   BILITOT 0.3 01/22/2019   ALKPHOS 44 01/22/2019   AST 19 01/22/2019   ALT 17 01/22/2019   PROT 7.0 01/22/2019   ALBUMIN 3.9 01/22/2019   CALCIUM 9.4 01/22/2019   GFRAA >90 05/19/2012   QFTBGOLDPLUS NEGATIVE 12/24/2018  December 24, 2018 IFE negative, TB Gold negative, immunoglobulins normal, hepatitis B-, hepatitis C negative, HIV negative, G6PD normal  10/14/18: ANA-, sed rate 31, RF 30, CCP 95, uric acid 7.1  Speciality Comments: PLQ Eye Exam: 12/15/18 WNL @ [facility] follow up in 6 months  Procedures:  No procedures performed Allergies: Contrast media  [iodinated diagnostic agents], Adhesive [tape], Erythromycin, and Penicillins   Assessment / Plan:     Visit Diagnoses: Rheumatoid arthritis involving multiple sites with positive rheumatoid factor (HCC) - Positive RF, positive anti-CCP, elevated ESR: She has no tenderness or synovitis on exam.  She was started on Plaquenil 200 mg 1 tablet by mouth twice daily 1 month ago.  She has been tolerating Plaquenil without any side effects.  She has noticed a significant improvement in joint pain and joint swelling since starting on Plaquenil.  She states that after 3 weeks of taking it her symptoms have almost completely resolved.  She will continue taking Plaquenil as prescribed.  A refill of Plaquenil was sent to the pharmacy today.  She was advised to notify us if she develops increased joint pain or joint swelling.  She will follow-up in the office in 3 months.- Plan: hydroxychloroquine (PLAQUENIL) 200 MG tablet  High risk medication use -Plaquenil 200 mg 1 tablet by mouth twice daily.  Last Plaquenil eye exam normal on 12/15/2018.  CBC will be drawn today.  CMP was within normal limits on 01/22/2019.  She will return for lab work in 3 months and every 5 months.- Plan: CBC with Differential/Platelet  Primary osteoarthritis of both feet: She has no discomfort in her feet at this time.  No joint swelling.  She wears proper fitting shoes.  Other medical conditions are listed as follows:  Essential hypertension  Hyperlipidemia associated with type 2 diabetes mellitus ([place])  Diabetes mellitus type 2 with complications ([place])  History of hypothyroidism  Osteopenia, unspecified location  Orders: Orders Placed This Encounter  Procedures  . CBC with Differential/Platelet   Meds ordered this encounter  Medications  . hydroxychloroquine (PLAQUENIL) 200 MG tablet    Sig: Take 1 tablet (200 mg total) by mouth 2 (two) times daily.    Dispense:  180 tablet    Refill:  0     Follow-Up Instructions:  Return in about 3 months (around 04/30/2019) for Rheumatoid arthritis, Osteoarthritis.   Ofilia Neas, PA-C   I examined and evaluated the patient with [name] PA.  Patient has noticed improvement on Plaquenil.  She has been on it only for 4 weeks.  She has some discomfort in her wrist joints but no tenderness was noted on the examination.  I would like for her to continue it for 3 more months to see the full benefit.  Patient had a recent fall which is causing some  discomfort in her left fifth finger.  She did not require any others injuries.  The plan of care was discussed as noted above.  Bo Merino, MD  Note - This record has been created using Editor, commissioning.  Chart creation errors have been sought, but may not always  have been located. Such creation errors do not reflect on  the standard of medical care.

## 2019-01-22 ENCOUNTER — Other Ambulatory Visit (INDEPENDENT_AMBULATORY_CARE_PROVIDER_SITE_OTHER): Payer: Medicare Other

## 2019-01-22 ENCOUNTER — Other Ambulatory Visit: Payer: Self-pay

## 2019-01-22 DIAGNOSIS — E1165 Type 2 diabetes mellitus with hyperglycemia: Secondary | ICD-10-CM

## 2019-01-22 DIAGNOSIS — Z794 Long term (current) use of insulin: Secondary | ICD-10-CM | POA: Diagnosis not present

## 2019-01-22 DIAGNOSIS — E039 Hypothyroidism, unspecified: Secondary | ICD-10-CM | POA: Diagnosis not present

## 2019-01-22 LAB — TSH: TSH: 1.24 u[IU]/mL (ref 0.35–4.50)

## 2019-01-22 LAB — MICROALBUMIN / CREATININE URINE RATIO
Creatinine,U: 80.4 mg/dL
Microalb Creat Ratio: 51.9 mg/g — ABNORMAL HIGH (ref 0.0–30.0)
Microalb, Ur: 41.7 mg/dL — ABNORMAL HIGH (ref 0.0–1.9)

## 2019-01-22 LAB — COMPREHENSIVE METABOLIC PANEL
ALT: 17 U/L (ref 0–35)
AST: 19 U/L (ref 0–37)
Albumin: 3.9 g/dL (ref 3.5–5.2)
Alkaline Phosphatase: 44 U/L (ref 39–117)
BUN: 11 mg/dL (ref 6–23)
CO2: 27 mEq/L (ref 19–32)
Calcium: 9.4 mg/dL (ref 8.4–10.5)
Chloride: 103 mEq/L (ref 96–112)
Creatinine, Ser: 0.86 mg/dL (ref 0.40–1.20)
GFR: 78.62 mL/min (ref >60.00)
Glucose, Bld: 80 mg/dL (ref 70–99)
Potassium: 4 mEq/L (ref 3.5–5.1)
Sodium: 140 mEq/L (ref 135–145)
Total Bilirubin: 0.3 mg/dL (ref 0.2–1.2)
Total Protein: 7 g/dL (ref 6.0–8.3)

## 2019-01-22 LAB — HEMOGLOBIN A1C: Hgb A1c MFr Bld: 7.2 % — ABNORMAL HIGH (ref 4.6–6.5)

## 2019-01-25 DIAGNOSIS — M0579 Rheumatoid arthritis with rheumatoid factor of multiple sites without organ or systems involvement: Secondary | ICD-10-CM | POA: Insufficient documentation

## 2019-01-25 DIAGNOSIS — Z79899 Other long term (current) drug therapy: Secondary | ICD-10-CM | POA: Insufficient documentation

## 2019-01-26 ENCOUNTER — Other Ambulatory Visit: Payer: Self-pay

## 2019-01-26 ENCOUNTER — Encounter: Payer: Self-pay | Admitting: Endocrinology

## 2019-01-26 ENCOUNTER — Ambulatory Visit: Payer: Medicare Other | Admitting: Endocrinology

## 2019-01-26 VITALS — BP 138/84 | HR 77 | Temp 98.1°F | Ht 65.0 in | Wt 238.0 lb

## 2019-01-26 DIAGNOSIS — Z794 Long term (current) use of insulin: Secondary | ICD-10-CM

## 2019-01-26 DIAGNOSIS — E1129 Type 2 diabetes mellitus with other diabetic kidney complication: Secondary | ICD-10-CM | POA: Diagnosis not present

## 2019-01-26 DIAGNOSIS — E1165 Type 2 diabetes mellitus with hyperglycemia: Secondary | ICD-10-CM | POA: Diagnosis not present

## 2019-01-26 DIAGNOSIS — E039 Hypothyroidism, unspecified: Secondary | ICD-10-CM | POA: Diagnosis not present

## 2019-01-26 DIAGNOSIS — R809 Proteinuria, unspecified: Secondary | ICD-10-CM

## 2019-01-26 DIAGNOSIS — I10 Essential (primary) hypertension: Secondary | ICD-10-CM

## 2019-01-26 NOTE — Progress Notes (Signed)
Patient ID: Marissa Roberts, female   DOB: 08/24/1945, 73 y.o.   MRN: [id number]               Reason for Appointment:  Follow-up for diabetes and hypertension   History of Present Illness:             Type 2 diabetes mellitus, date of diagnosis: 2005       INSULIN regimen is  Tresiba 54 units am, Novolog  15-20 units bid- tid ac  Oral hypoglycemic drugs the patient is taking are: Metformin 2 g daily    Current blood sugar patterns, management and problems identified:  Her A1c is significantly better at 7.2 compared to 7.8   More recently she appears to be losing weight  She thinks that her appetite is decreased possibly from starting Plaquenil  She is eating smaller portions even though she is eating out frequently  She still has sweet tea but is trying to cut back on the amount of sugar for this  Her weight has gone down recently  As before she is totally forgetful about checking blood sugars after meals as directed  She now says that her test strips are costing her $20 and she does not want to check more than once a day  Usually trying to remember to take her NovoLog before starting to eat  No hypoglycemic symptoms     Side effects from medications have been:none  Compliance with the medical regimen: Fair  Glucose monitoring:  done 1 time a day         Glucometer:  One Touch.      Blood Glucose readings from review of monitor download:  FASTING blood sugar range 78-132, median 88  Previous records: 90-168 with AVERAGE 120      Self-care:   Meals: 3 meals per day. Breakfast is usually grits with or without eggs or meat Not always restricting fat intake or sweet tea  when eating out        Exercise:  Minimal  Dietician visit, most recent:2014.               Weight history:  Wt Readings from Last 3 Encounters:  01/26/19 238 lb (108 kg)  12/24/18 244 lb 6.4 oz (110.9 kg)  10/23/18 244 lb 9.6 oz (110.9 kg)    Glycemic control:   Lab Results   Component Value Date   HGBA1C 7.2 (H) 01/22/2019   HGBA1C 7.8 (H) 10/20/2018   HGBA1C 7.9 (H) 07/21/2018   Lab Results  Component Value Date   MICROALBUR 41.7 (H) 01/22/2019   LDLCALC 77 04/17/2018   CREATININE 0.86 01/22/2019   No results found for: FRUCTOSAMINE    Past history:  She thinks her blood sugars were only mildly increased at borderline levels at the onsetper Most likely she was treated with metformin initially and this has been continued.   At some point she was also tried on [place] and [place] for improving her control. She is not sure why she was started on insulin 5 years ago, presumably for worsening hyperglycemia Also not clear if she has tried various insulin regimens before starting Lantus and NovoLog  Previously had tried the V-go pump but subsequently she stopped doing the V-go pump because of the cost, skin irritation and local discomfort   Allergies as of 01/26/2019      Reactions   Contrast Media [iodinated Diagnostic Agents]    Throat swells from ct con tra   Adhesive [  tape]    No paper tape, causes blisters   Erythromycin    REACTION: rash   Penicillins    REACTION: rash      Medication List       Accurate as of January 26, 2019  9:28 AM. If you have any questions, ask your nurse or doctor.        amLODipine 5 MG tablet Commonly known as: NORVASC Take 1 tablet by mouth twice daily   calcium carbonate 600 MG Tabs tablet Commonly known as: OS-CAL Take by mouth.   fluticasone 50 MCG/ACT nasal spray Commonly known as: FLONASE SPRAY 2 SPRAYS INTO EACH NOSTRIL EVERY DAY What changed: See the new instructions.   furosemide 40 MG tablet Commonly known as: LASIX Take 2 tablets by mouth once daily   GLUCOSAMINE PO Take by mouth daily.   hydroxychloroquine 200 MG tablet Commonly known as: PLAQUENIL Take 1 tablet (200 mg total) by mouth 2 (two) times daily.   Insulin Pen Needle 32G X 4 MM Misc Commonly known as: BD Pen Needle  Nano U/F USE TO INJECT INSULIN 4 TIMES DAILY   levothyroxine 137 MCG tablet Commonly known as: SYNTHROID Take 1 tablet by mouth once daily   Linzess 290 MCG Caps capsule Generic drug: linaclotide Take 1 capsule (290 mcg total) by mouth daily. What changed:   when to take this  reasons to take this   metFORMIN 1000 MG tablet Commonly known as: GLUCOPHAGE Take 1 tablet (1,000 mg total) by mouth 2 (two) times daily with a meal.   montelukast 10 MG tablet Commonly known as: SINGULAIR Take 1 tablet (10 mg total) by mouth at bedtime.   NovoLOG FlexPen 100 UNIT/ML FlexPen Generic drug: insulin aspart Inject 15-22 units under the skin three times daily before meals. What changed: additional instructions   OneTouch Delica Lancets 99991111 Misc USE TO CHECK BLOOD SUGAR UP TO THREE TIMES A DAY 2 HOURS AFTER A MEAL AND UP TO 3 TIMES A WEEK UPON WAKING UP   OneTouch Verio test strip Generic drug: glucose blood Use Onetouch verio test strips as instructed to check blood sugars 4 times daily. DX:E11.65   potassium chloride 10 MEQ tablet Commonly known as: KLOR-CON Take 1 tablet (10 mEq total) by mouth 2 (two) times daily.   rosuvastatin 40 MG tablet Commonly known as: CRESTOR Take 1 tablet by mouth once daily   Tresiba FlexTouch 200 UNIT/ML Sopn Generic drug: Insulin Degludec INJECT 54 UNITS SUBCUTANEOUSLY IN THE MORNING   valsartan-hydrochlorothiazide 320-12.5 MG tablet Commonly known as: DIOVAN-HCT Take 1 tablet by mouth daily.   Vitamin D3 1.25 MG (50000 UT) Caps Take 1 capsule by mouth once a week.       Allergies:  Allergies  Allergen Reactions  . Contrast Media [Iodinated Diagnostic Agents]     Throat swells from ct con tra  . Adhesive [Tape]     No paper tape, causes blisters  . Erythromycin     REACTION: rash  . Penicillins     REACTION: rash    Past Medical History:  Diagnosis Date  . Arthritis   . Bronchitis    hx of  . CHF (congestive heart failure)  ([place])   . Diabetes mellitus   . GERD (gastroesophageal reflux disease)   . Hypercholesteremia   . Hypertension   . Hypothyroidism   . Pneumonia    hx of  . PONV (postoperative nausea and vomiting)   . Thyroid disease     Past  Surgical History:  Procedure Laterality Date  . ABDOMINAL HYSTERECTOMY     partial  . BREAST SURGERY     lumpectomy left breast  . carpel tunnel    . COLONOSCOPY W/ POLYPECTOMY    . FOOT SURGERY    . knee arhtroscopy    . SHOULDER ARTHROSCOPY WITH SUBACROMIAL DECOMPRESSION, ROTATOR CUFF REPAIR AND BICEP TENDON REPAIR Right 05/26/2012   Procedure: Right Shoulder Diagnostic Operative Arthroscopy, Subacromial Decompression, Biceps Tenodesis, Mini Open Rotator Cuff Repair;  Surgeon: [name], MD;  Location: [facility];  Service: Orthopedics;  Laterality: Right;  Right Shoulder Diagnostic Operative Arthroscopy, Subacromial Decompression, Biceps Tenodesis, Mini Open Rotator Cuff Repair  . TONSILLECTOMY      Family History  Problem Relation Age of Onset  . Diabetes Mother   . Heart disease Mother   . Hyperlipidemia Mother   . Diabetes Father   . Diabetes Sister   . Hypertension Sister   . Heart disease Sister   . Dementia Sister   . Heart disease Brother   . Healthy Daughter   . Healthy Daughter   . Healthy Daughter   . Healthy Son     Social History:  reports that she quit smoking about 28 years ago. She has never used smokeless tobacco. She reports that she does not drink alcohol or use drugs.    Review of Systems   HYPERTENSION: She is on amlodipine 5 mg twice daily and Diovan HCT 320/12.5  Also has microalbuminuria  She is not checking blood pressure at home  BP Readings from Last 3 Encounters:  01/26/19 138/84  12/24/18 (!) 147/76  10/23/18 (!) 142/70    On Lasix 80mg  for edema daily and she may occasionally have edema but none recently   LIPIDS:  She is taking rosuvastatin 40 mg, previously LDL was high with simvastatin 20 mg  LDL below 100 Crestor prescribed by PCP      Lab Results  Component Value Date   CHOL 157 04/17/2018   HDL 51.00 04/17/2018   LDLCALC 77 04/17/2018   LDLDIRECT 129.5 02/06/2012   TRIG 145.0 04/17/2018   CHOLHDL 3 04/17/2018                  Thyroid:      She has been hypothyroid for over 40 years and is taking a supplement of 137 g levothyroxine, 6 tablets per week Has not needed change in the dosage for some time  TSH has been very consistently normal  Last labs:   Lab Results  Component Value Date   TSH 1.24 01/22/2019   TSH 0.63 07/21/2018   TSH 1.95 10/10/2017   FREET4 1.11 07/21/2018   FREET4 1.05 10/10/2017   FREET4 1.13 04/11/2017      Physical Examination:  BP 138/84 (BP Location: Left Arm, Patient Position: Sitting, Cuff Size: Normal)   Pulse 77   Temp 98.1 F (36.7 C) (Oral)   Ht 5\' 5"  (1.651 m)   Wt 238 lb (108 kg)   SpO2 97%   BMI 39.61 kg/m   No significant ankle edema  ASSESSMENT/PLAN:  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:  See history of present illness for detailed discussion of current diabetes management, blood sugar patterns and problems identified  She is on basal bolus insulin and metformin  A1c is significantly better at 7.2 Her fasting blood sugars are also much better This is likely to be from her somewhat decreased appetite and weight loss which may be related to  starting Plaquenil Although she has not had any hypoglycemia her fasting readings are averaging below 109 No postprandial readings available   Recommendations:  Check blood sugars at times when she takes her insulin injection at lunch or dinner  She will check to see if her insurance will cover a different brand of meter/test strips better  Check blood sugars in the mornings 3 times a week  Reduce TRESIBA down to 50 units for now and discussed adjusting it based on fasting readings  She will call if she has any unusually high readings later in the day  Follow-up  in 3 months  HYPERTENSION with microalbuminuria: Blood pressure is high normal today  She just had a refill on her Norvasc and will continue with before her next prescription she needs to call us and we will switch her to diltiazem ER 360 mg daily because of her proteinuria  Continue Lasix for edema, she is still having good control despite taking 10 mg Norvasc  HYPOTHYROIDISM: her TSH is consistently normal  Counseling time on subjects discussed in assessment and plan sections is over 50% of today's 25 minute visit   There are no Patient Instructions on file for this visit.   Elayne Snare 01/26/2019, 9:28 AM   Note: This office note was prepared with Dragon voice recognition system technology. Any transcriptional errors that result from this process are unintentional.

## 2019-01-26 NOTE — Patient Instructions (Addendum)
Check blood sugars on waking up 4 days a week or before supper  Also check blood sugars about 2 hours after meals and do this after different meals by rotation  Recommended blood sugar levels on waking up are 90-130 and about 2 hours after meal is 130-160  Please bring your blood sugar monitor to each visit, thank you  Tresiba 50 units daily  Call before refilling amlodipine  Check test strip cost

## 2019-01-28 ENCOUNTER — Ambulatory Visit: Payer: Medicare Other | Admitting: Rheumatology

## 2019-01-28 ENCOUNTER — Other Ambulatory Visit: Payer: Self-pay

## 2019-01-28 ENCOUNTER — Encounter: Payer: Self-pay | Admitting: Rheumatology

## 2019-01-28 VITALS — BP 138/74 | HR 73 | Resp 14 | Ht 65.0 in | Wt 240.0 lb

## 2019-01-28 DIAGNOSIS — M19071 Primary osteoarthritis, right ankle and foot: Secondary | ICD-10-CM | POA: Diagnosis not present

## 2019-01-28 DIAGNOSIS — M8580 Other specified disorders of bone density and structure, unspecified site: Secondary | ICD-10-CM

## 2019-01-28 DIAGNOSIS — E1169 Type 2 diabetes mellitus with other specified complication: Secondary | ICD-10-CM

## 2019-01-28 DIAGNOSIS — Z79899 Other long term (current) drug therapy: Secondary | ICD-10-CM | POA: Diagnosis not present

## 2019-01-28 DIAGNOSIS — I10 Essential (primary) hypertension: Secondary | ICD-10-CM | POA: Diagnosis not present

## 2019-01-28 DIAGNOSIS — M0579 Rheumatoid arthritis with rheumatoid factor of multiple sites without organ or systems involvement: Secondary | ICD-10-CM | POA: Diagnosis not present

## 2019-01-28 DIAGNOSIS — M19072 Primary osteoarthritis, left ankle and foot: Secondary | ICD-10-CM

## 2019-01-28 DIAGNOSIS — E785 Hyperlipidemia, unspecified: Secondary | ICD-10-CM

## 2019-01-28 DIAGNOSIS — E118 Type 2 diabetes mellitus with unspecified complications: Secondary | ICD-10-CM

## 2019-01-28 DIAGNOSIS — Z8639 Personal history of other endocrine, nutritional and metabolic disease: Secondary | ICD-10-CM

## 2019-01-28 LAB — CBC WITH DIFFERENTIAL/PLATELET
Absolute Monocytes: 848 cells/uL (ref 200–950)
Basophils Absolute: 40 cells/uL (ref 0–200)
Basophils Relative: 0.4 %
Eosinophils Absolute: 162 cells/uL (ref 15–500)
Eosinophils Relative: 1.6 %
HCT: 32.3 % — ABNORMAL LOW (ref 35.0–45.0)
Hemoglobin: 10.1 g/dL — ABNORMAL LOW (ref 11.7–15.5)
Lymphs Abs: 3747 cells/uL (ref 850–3900)
MCH: 24 pg — ABNORMAL LOW (ref 27.0–33.0)
MCHC: 31.3 g/dL — ABNORMAL LOW (ref 32.0–36.0)
MCV: 76.7 fL — ABNORMAL LOW (ref 80.0–100.0)
MPV: 11.6 fL (ref 7.5–12.5)
Monocytes Relative: 8.4 %
Neutro Abs: 5303 cells/uL (ref 1500–7800)
Neutrophils Relative %: 52.5 %
Platelets: 316 10*3/uL (ref 140–400)
RBC: 4.21 10*6/uL (ref 3.80–5.10)
RDW: 15.7 % — ABNORMAL HIGH (ref 11.0–15.0)
Total Lymphocyte: 37.1 %
WBC: 10.1 10*3/uL (ref 3.8–10.8)

## 2019-01-28 MED ORDER — HYDROXYCHLOROQUINE SULFATE 200 MG PO TABS: 200.0000 mg | ORAL_TABLET | Freq: Two times a day (BID) | ORAL | 0 refills | Status: DC

## 2019-01-29 NOTE — Progress Notes (Signed)
Hgb and Hct are low and trending down.  Please notify patient and forward to PCP.

## 2019-02-03 ENCOUNTER — Telehealth: Payer: Self-pay | Admitting: *Deleted

## 2019-02-03 NOTE — Telephone Encounter (Signed)
Please advise on 01/28/19 labs.

## 2019-02-03 NOTE — Telephone Encounter (Signed)
Copied from [place] [id number]. Topic: General - Inquiry >> Feb 03, 2019 10:04 AM Alanda Slim E wrote: Reason for CRM: Pt had a test done with her rheumatologist and was advised the report was sent to Dr. Sharlet Salina and to call her/ Pt would like to speak with Dr. Sharlet Salina about the report and results

## 2019-02-04 ENCOUNTER — Ambulatory Visit (INDEPENDENT_AMBULATORY_CARE_PROVIDER_SITE_OTHER): Payer: Medicare Other | Admitting: Internal Medicine

## 2019-02-04 ENCOUNTER — Encounter: Payer: Self-pay | Admitting: Internal Medicine

## 2019-02-04 ENCOUNTER — Other Ambulatory Visit: Payer: Self-pay

## 2019-02-04 ENCOUNTER — Other Ambulatory Visit (INDEPENDENT_AMBULATORY_CARE_PROVIDER_SITE_OTHER): Payer: Medicare Other

## 2019-02-04 ENCOUNTER — Other Ambulatory Visit: Payer: Self-pay | Admitting: Internal Medicine

## 2019-02-04 VITALS — BP 120/70 | HR 81 | Temp 98.5°F | Ht 65.0 in | Wt 242.0 lb

## 2019-02-04 DIAGNOSIS — D649 Anemia, unspecified: Secondary | ICD-10-CM | POA: Diagnosis not present

## 2019-02-04 LAB — FERRITIN: Ferritin: 9.4 ng/mL — ABNORMAL LOW (ref 10.0–291.0)

## 2019-02-04 LAB — CBC WITH DIFFERENTIAL/PLATELET
Basophils Absolute: 0.1 10*3/uL (ref 0.0–0.1)
Basophils Relative: 0.7 % (ref 0.0–3.0)
Eosinophils Absolute: 0.2 10*3/uL (ref 0.0–0.7)
Eosinophils Relative: 2.1 % (ref 0.0–5.0)
HCT: 31.4 % — ABNORMAL LOW (ref 36.0–46.0)
Hemoglobin: 10 g/dL — ABNORMAL LOW (ref 12.0–15.0)
Lymphocytes Relative: 32.9 % (ref 12.0–46.0)
Lymphs Abs: 3.7 10*3/uL (ref 0.7–4.0)
MCHC: 32 g/dL (ref 30.0–36.0)
MCV: 75.4 fl — ABNORMAL LOW (ref 78.0–100.0)
Monocytes Absolute: 1.1 10*3/uL — ABNORMAL HIGH (ref 0.1–1.0)
Monocytes Relative: 9.5 % (ref 3.0–12.0)
Neutro Abs: 6.2 10*3/uL (ref 1.4–7.7)
Neutrophils Relative %: 54.8 % (ref 43.0–77.0)
Platelets: 370 10*3/uL (ref 150.0–400.0)
RBC: 4.17 Mil/uL (ref 3.87–5.11)
RDW: 16.9 % — ABNORMAL HIGH (ref 11.5–15.5)
WBC: 11.3 10*3/uL — ABNORMAL HIGH (ref 4.0–10.5)

## 2019-02-04 LAB — VITAMIN B12: Vitamin B-12: 230 pg/mL (ref 211–911)

## 2019-02-04 NOTE — Progress Notes (Signed)
   Subjective:   Patient ID: Marissa Roberts, female    DOB: 20-Aug-1945, 73 y.o.   MRN: [id number]  HPI The patient is a 73 YO female coming in for follow up of labs checked by her rheumatologist. She has just started plaquenil about a month or so ago. They did not think this was the cause of recent low blood counts. She denies nosebleeds, blood in stool, dark stool. Denies chest pains or SOB or lightheadedness.   Review of Systems  Constitutional: Negative.   HENT: Negative.   Eyes: Negative.   Respiratory: Negative for cough, chest tightness and shortness of breath.   Cardiovascular: Negative for chest pain, palpitations and leg swelling.  Gastrointestinal: Negative for abdominal distention, abdominal pain, constipation, diarrhea, nausea and vomiting.  Musculoskeletal: Negative.   Skin: Negative.   Neurological: Negative.   Psychiatric/Behavioral: Negative.     Objective:  Physical Exam Constitutional:      Appearance: She is well-developed.  HENT:     Head: Normocephalic and atraumatic.  Neck:     Musculoskeletal: Normal range of motion.  Cardiovascular:     Rate and Rhythm: Normal rate and regular rhythm.  Pulmonary:     Effort: Pulmonary effort is normal. No respiratory distress.     Breath sounds: Normal breath sounds. No wheezing or rales.  Abdominal:     General: Bowel sounds are normal. There is no distension.     Palpations: Abdomen is soft.     Tenderness: There is no abdominal tenderness. There is no rebound.  Skin:    General: Skin is warm and dry.  Neurological:     Mental Status: She is alert and oriented to person, place, and time.     Coordination: Coordination normal.     Vitals:   02/04/19 1356  BP: 120/70  Pulse: 81  Temp: 98.5 F (36.9 C)  TempSrc: Oral  SpO2: 95%  Weight: 242 lb (109.8 kg)  Height: 5\' 5"  (1.651 m)    Assessment & Plan:

## 2019-02-04 NOTE — Patient Instructions (Signed)
We will get the records about the last colonoscopy and check some labs today.

## 2019-02-04 NOTE — Telephone Encounter (Signed)
I would recommend visit to discuss as just the numbers do not provide any clinical information about her.

## 2019-02-04 NOTE — Telephone Encounter (Signed)
Can you make patient an appointment please and thank you

## 2019-02-05 DIAGNOSIS — D649 Anemia, unspecified: Secondary | ICD-10-CM | POA: Insufficient documentation

## 2019-02-05 NOTE — Assessment & Plan Note (Signed)
Colonoscopy may be up to date. Getting records as last one needed follow up in 2 years and we do not have records of this. Checking CBC, ferritin, B12 and adjust as needed. There is a rare risk of aplastic anemia with plaquenil.

## 2019-02-08 ENCOUNTER — Encounter: Payer: Self-pay | Admitting: Internal Medicine

## 2019-02-08 NOTE — Progress Notes (Signed)
Abstracted and sent to scan  

## 2019-02-09 ENCOUNTER — Other Ambulatory Visit: Payer: Self-pay | Admitting: Endocrinology

## 2019-02-19 ENCOUNTER — Telehealth: Payer: Self-pay

## 2019-02-19 NOTE — Telephone Encounter (Signed)
Patient called in wanting to know if she qualfies for the [employer]    Please call and advise

## 2019-02-22 NOTE — Telephone Encounter (Signed)
Only if she checks blood sugars 4 times a day per Medicare guidelines.  She can also check with her insurance

## 2019-02-22 NOTE — Telephone Encounter (Signed)
Called pt and gave her MD message. Pt verbalized understanding and she stated that she would call the office back at a later date and inform us as to whether her insurance will pay for it or not.

## 2019-02-23 ENCOUNTER — Other Ambulatory Visit: Payer: Medicare Other

## 2019-03-01 ENCOUNTER — Telehealth: Payer: Self-pay | Admitting: Rheumatology

## 2019-03-01 NOTE — Telephone Encounter (Signed)
Patient stopped by the office stating her right hand is swollen and painful.  Patient was offered a virtual appointment, but is requesting a return call to let her know if there is something she can take for the swelling and pain.  Patient was told that the nurse was not in the office until Wednesday, 03/03/19 and would return her call then.

## 2019-03-02 MED ORDER — PREDNISONE 5 MG PO TABS: ORAL_TABLET | ORAL | 0 refills | Status: DC

## 2019-03-02 NOTE — Progress Notes (Deleted)
Virtual Visit via Telephone Note  I connected with Marissa Roberts on 03/02/19 at  2:30 PM EST by telephone and verified that I am speaking with the correct person using two identifiers.  Location: Patient: Home  Provider: Clinic  This service was conducted via virtual visit.   The patient was located at home. I was located in my office.  Consent was obtained prior to the virtual visit and is aware of possible charges through their insurance for this visit.  The patient is an established patient.  Dr. Estanislado Pandy, MD conducted the virtual visit and [name], PA-C acted as scribe during the service.  Office staff helped with scheduling follow up visits after the service was conducted.   I discussed the limitations, risks, security and privacy concerns of performing an evaluation and management service by telephone and the availability of in person appointments. I also discussed with the patient that there may be a patient responsible charge related to this service. The patient expressed understanding and agreed to proceed.  CC: Discuss medications  History of Present Illness: Patient is a 73 year old female with past medical history of seropositive rheumatoid arthritis and osteoarthritis.   Review of Systems  Constitutional: Negative for fever and malaise/fatigue.  Eyes: Negative for photophobia, pain, discharge and redness.  Respiratory: Negative for cough, shortness of breath and wheezing.   Cardiovascular: Negative for chest pain and palpitations.  Gastrointestinal: Negative for blood in stool, constipation and diarrhea.  Genitourinary: Negative for dysuria.  Musculoskeletal: Negative for back pain, joint pain, myalgias and neck pain.  Skin: Negative for rash.  Neurological: Negative for dizziness and headaches.  Psychiatric/Behavioral: Negative for depression. The patient is not nervous/anxious and does not have insomnia.       Observations/Objective: Physical Exam   Constitutional: She is oriented to person, place, and time.  Neurological: She is alert and oriented to person, place, and time.  Psychiatric: Mood, memory, affect and judgment normal.   Patient reports morning stiffness for  {minute/hour:19697}.   Patient {Actions; denies-reports:120008} nocturnal pain.  Difficulty dressing/grooming: {ACTIONS;DENIES/REPORTS:21021675::"Denies"} Difficulty climbing stairs: {ACTIONS;DENIES/REPORTS:21021675::"Denies"} Difficulty getting out of chair: {ACTIONS;DENIES/REPORTS:21021675::"Denies"} Difficulty using hands for taps, buttons, cutlery, and/or writing: {ACTIONS;DENIES/REPORTS:21021675::"Denies"}   Assessment and Plan: Visit Diagnoses: Rheumatoid arthritis involving multiple sites with positive rheumatoid factor (HCC) - Positive RF, positive anti-CCP, elevated ESR:   High risk medication use -Plaquenil 200 mg 1 tablet by mouth twice daily.  Last Plaquenil eye exam normal on 12/15/2018.    Primary osteoarthritis of both feet:   Other medical conditions are listed as follows:  Essential hypertension  Hyperlipidemia associated with type 2 diabetes mellitus ([place])  Diabetes mellitus type 2 with complications ([place])  History of hypothyroidism  Osteopenia, unspecified location   Follow Up Instructions: She will follow up in    I discussed the assessment and treatment plan with the patient. The patient was provided an opportunity to ask questions and all were answered. The patient agreed with the plan and demonstrated an understanding of the instructions.   The patient was advised to call back or seek an in-person evaluation if the symptoms worsen or if the condition fails to improve as anticipated.  I provided *** minutes of non-face-to-face time during this encounter.   Ofilia Neas, PA-C

## 2019-03-02 NOTE — Telephone Encounter (Signed)
Patient requested prescription be sent to [facility]. Patient is scheduled for virtual visit on 03/03/2019.

## 2019-03-02 NOTE — Telephone Encounter (Signed)
I spoke with patient.  It appears that she is having a flare of rheumatoid arthritis.  Plaquenil has not been very effective for her.  It was initially effective.  We will call in a prednisone taper starting at 20 mg p.o. daily and taper by 5 mg every week.  Please schedule a virtual visit for her to start on methotrexate.

## 2019-03-02 NOTE — Telephone Encounter (Signed)
Patient states her right hand is painful and swollen, patient states she has been taking plaquenil as prescribed and has not missed any doses. Patient states she was doing great at visit on 01/28/2019 and the next week, her right hand started swelling and the pain returned. Patient has also states her left hand and both wrist are now painful. Patient states she has used a lidocaine patch on her wrist for relief. Patient questioned pain medication and I advised patient that Dr. Estanislado Pandy does not rx any pain medication. Advised patient it would be best to schedule a virtual visit and she declined. Please advise.

## 2019-03-03 ENCOUNTER — Other Ambulatory Visit: Payer: Self-pay

## 2019-03-03 ENCOUNTER — Telehealth: Payer: Medicare Other | Admitting: Rheumatology

## 2019-03-03 ENCOUNTER — Telehealth (INDEPENDENT_AMBULATORY_CARE_PROVIDER_SITE_OTHER): Payer: Medicare Other | Admitting: Rheumatology

## 2019-03-03 ENCOUNTER — Encounter: Payer: Self-pay | Admitting: Rheumatology

## 2019-03-03 DIAGNOSIS — M0579 Rheumatoid arthritis with rheumatoid factor of multiple sites without organ or systems involvement: Secondary | ICD-10-CM | POA: Diagnosis not present

## 2019-03-03 DIAGNOSIS — Z79899 Other long term (current) drug therapy: Secondary | ICD-10-CM | POA: Diagnosis not present

## 2019-03-03 DIAGNOSIS — M19071 Primary osteoarthritis, right ankle and foot: Secondary | ICD-10-CM | POA: Diagnosis not present

## 2019-03-03 DIAGNOSIS — E1169 Type 2 diabetes mellitus with other specified complication: Secondary | ICD-10-CM

## 2019-03-03 DIAGNOSIS — I10 Essential (primary) hypertension: Secondary | ICD-10-CM

## 2019-03-03 DIAGNOSIS — Z8639 Personal history of other endocrine, nutritional and metabolic disease: Secondary | ICD-10-CM

## 2019-03-03 DIAGNOSIS — E118 Type 2 diabetes mellitus with unspecified complications: Secondary | ICD-10-CM

## 2019-03-03 DIAGNOSIS — M19072 Primary osteoarthritis, left ankle and foot: Secondary | ICD-10-CM

## 2019-03-03 DIAGNOSIS — M8580 Other specified disorders of bone density and structure, unspecified site: Secondary | ICD-10-CM

## 2019-03-03 DIAGNOSIS — E785 Hyperlipidemia, unspecified: Secondary | ICD-10-CM

## 2019-03-03 NOTE — Patient Instructions (Addendum)
reduce prednisone to 10 mg for 1 week and taper by 2.5 mg every week.   Standing Labs We placed an order today for your standing lab work.    Please come back and get your standing labs in 2 weeks x2, then every 3 months.   We have open lab daily Monday through Thursday from 8:30-12:30 PM and 1:30-4:30 PM and Friday from 8:30-12:30 PM and 1:30-4:00 PM at the office of Dr. Bo Merino.   You may experience shorter wait times on Monday and Friday afternoons. The office is located at [address] [place], [place], [place] [address] No appointment is necessary.   Labs are drawn by [facility].  You may receive a bill from [facility] for your lab work.  If you wish to have your labs drawn at another location, please call the office 24 hours in advance to send orders.  If you have any questions regarding directions or hours of operation,  please call [phone].   Just as a reminder please drink plenty of water prior to coming for your lab work. Thanks!     Methotrexate tablets What is this medicine? METHOTREXATE (METH oh TREX ate) is a chemotherapy drug used to treat cancer including breast cancer, leukemia, and lymphoma. This medicine can also be used to treat psoriasis and certain kinds of arthritis. This medicine may be used for other purposes; ask your health care provider or pharmacist if you have questions. COMMON BRAND NAME(S): Rheumatrex, Trexall What should I tell my health care provider before I take this medicine? They need to know if you have any of these conditions:  fluid in the stomach area or lungs  if you often drink alcohol  infection or immune system problems  kidney disease or on hemodialysis  liver disease  low blood counts, like low white cell, platelet, or red cell counts  lung disease  radiation therapy  stomach ulcers  ulcerative colitis  an unusual or allergic reaction to methotrexate, other medicines, foods, dyes, or  preservatives  pregnant or trying to get pregnant  breast-feeding How should I use this medicine? Take this medicine by mouth with a glass of water. Follow the directions on the prescription label. Take your medicine at regular intervals. Do not take it more often than directed. Do not stop taking except on your doctor's advice. Make sure you know why you are taking this medicine and how often you should take it. If this medicine is used for a condition that is not cancer, like arthritis or psoriasis, it should be taken weekly, NOT daily. Taking this medicine more often than directed can cause serious side effects, even death. Talk to your healthcare provider about safe handling and disposal of this medicine. You may need to take special precautions. Talk to your pediatrician regarding the use of this medicine in children. While this drug may be prescribed for selected conditions, precautions do apply. Overdosage: If you think you have taken too much of this medicine contact a poison control center or emergency room at once. NOTE: This medicine is only for you. Do not share this medicine with others. What if I miss a dose? If you miss a dose, talk with your doctor or health care professional. Do not take double or extra doses. What may interact with this medicine? This medicine may interact with the following medication:  acitretin  aspirin and aspirin-like medicines including salicylates  azathioprine  certain antibiotics like penicillins, tetracycline, and chloramphenicol  cyclosporine  gold  hydroxychloroquine  live virus vaccines  NSAIDs, medicines for pain and inflammation, like ibuprofen or naproxen  other cytotoxic agents  penicillamine  phenylbutazone  phenytoin  probenecid  retinoids such as isotretinoin and tretinoin  steroid medicines like prednisone or cortisone  sulfonamides like sulfasalazine and trimethoprim/sulfamethoxazole  theophylline This list  may not describe all possible interactions. Give your health care provider a list of all the medicines, herbs, non-prescription drugs, or dietary supplements you use. Also tell them if you smoke, drink alcohol, or use illegal drugs. Some items may interact with your medicine. What should I watch for while using this medicine? Avoid alcoholic drinks. This medicine can make you more sensitive to the sun. Keep out of the sun. If you cannot avoid being in the sun, wear protective clothing and use sunscreen. Do not use sun lamps or tanning beds/booths. You may need blood work done while you are taking this medicine. Call your doctor or health care professional for advice if you get a fever, chills or sore throat, or other symptoms of a cold or flu. Do not treat yourself. This drug decreases your body's ability to fight infections. Try to avoid being around people who are sick. This medicine may increase your risk to bruise or bleed. Call your doctor or health care professional if you notice any unusual bleeding. Check with your doctor or health care professional if you get an attack of severe diarrhea, nausea and vomiting, or if you sweat a lot. The loss of too much body fluid can make it dangerous for you to take this medicine. Talk to your doctor about your risk of cancer. You may be more at risk for certain types of cancers if you take this medicine. Both men and women must use effective birth control with this medicine. Do not become pregnant while taking this medicine or until at least 1 normal menstrual cycle has occurred after stopping it. Women should inform their doctor if they wish to become pregnant or think they might be pregnant. Men should not father a child while taking this medicine and for 3 months after stopping it. There is a potential for serious side effects to an unborn child. Talk to your health care professional or pharmacist for more information. Do not breast-feed an infant while taking  this medicine. What side effects may I notice from receiving this medicine? Side effects that you should report to your doctor or health care professional as soon as possible:  allergic reactions like skin rash, itching or hives, swelling of the face, lips, or tongue  breathing problems or shortness of breath  diarrhea  dry, nonproductive cough  low blood counts - this medicine may decrease the number of white blood cells, red blood cells and platelets. You may be at increased risk for infections and bleeding.  mouth sores  redness, blistering, peeling or loosening of the skin, including inside the mouth  signs of infection - fever or chills, cough, sore throat, pain or trouble passing urine  signs and symptoms of bleeding such as bloody or black, tarry stools; red or dark-brown urine; spitting up blood or brown material that looks like coffee grounds; red spots on the skin; unusual bruising or bleeding from the eye, gums, or nose  signs and symptoms of kidney injury like trouble passing urine or change in the amount of urine  signs and symptoms of liver injury like dark yellow or brown urine; general ill feeling or flu-like symptoms; light-colored stools; loss of appetite; nausea; right upper  belly pain; unusually weak or tired; yellowing of the eyes or skin Side effects that usually do not require medical attention (report to your doctor or health care professional if they continue or are bothersome):  dizziness  hair loss  tiredness  upset stomach  vomiting This list may not describe all possible side effects. Call your doctor for medical advice about side effects. You may report side effects to FDA at 1-800-FDA-1088. Where should I keep my medicine? Keep out of the reach of children. Store at room temperature between 20 and 25 degrees C (68 and 77 degrees F). Protect from light. Throw away any unused medicine after the expiration date. NOTE: This sheet is a summary. It may  not cover all possible information. If you have questions about this medicine, talk to your doctor, pharmacist, or health care provider.  2020 Elsevier/Gold Standard (2016-11-07 13:38:43)

## 2019-03-03 NOTE — Progress Notes (Signed)
Virtual Visit via Telephone Note  I connected with Marissa Roberts on 03/03/19 at 11:30 AM EST by telephone and verified that I am speaking with the correct person using two identifiers.  Location: Patient: Home  Provider: Clinic  This service was conducted via virtual visit.  The patient was located at home. I was located in my office.  Consent was obtained prior to the virtual visit and is aware of possible charges through their insurance for this visit.  The patient is an established patient.  Dr. Estanislado Pandy, MD conducted the virtual visit and Marissa Sams, PA-C acted as scribe during the service.  Office staff helped with scheduling follow up visits after the service was conducted.   I discussed the limitations, risks, security and privacy concerns of performing an evaluation and management service by telephone and the availability of in person appointments. I also discussed with the patient that there may be a patient responsible charge related to this service. The patient expressed understanding and agreed to proceed.  CC: Discuss medications  History of Present Illness: Patient is a 73 year old female with past medical history of seropositive rheumatoid arthritis and osteoarthritis.  She is taking plaquenil 200 mg 1 tablet BID. She has been having severe pain and inflammation in the right hand and right wrist joint for the past 1 month.  She has also started having discomfort in the left hand and wrist which is a new concern.  She has intermittent pain in both ankle joints.  She would like to discuss other treatment options.  She started the prednisone taper today and noticed that her blood glucose is elevated, so she is concerned about that as well.   Review of Systems  Constitutional: Negative for fever and malaise/fatigue.  Eyes: Negative for photophobia, pain, discharge and redness.  Respiratory: Negative for cough, shortness of breath and wheezing.   Cardiovascular: Negative for chest  pain and palpitations.  Gastrointestinal: Negative for blood in stool, constipation and diarrhea.  Genitourinary: Negative for dysuria.  Musculoskeletal: Positive for joint pain. Negative for back pain, myalgias and neck pain.       +Morning stiffness  +Joint swelling  Skin: Negative for rash.  Neurological: Negative for dizziness and headaches.  Psychiatric/Behavioral: Negative for depression. The patient is not nervous/anxious and does not have insomnia.       Observations/Objective: Physical Exam  Constitutional: She is oriented to person, place, and time.  Neurological: She is alert and oriented to person, place, and time.  Psychiatric: Mood, memory, affect and judgment normal.   Patient reports morning stiffness for  1 hour.   Patient denies nocturnal pain.  Difficulty dressing/grooming: Denies Difficulty climbing stairs: Reports Difficulty getting out of chair: Reports Difficulty using hands for taps, buttons, cutlery, and/or writing: Reports   Assessment and Plan: Visit Diagnoses: Rheumatoid arthritis involving multiple sites with positive rheumatoid factor (HCC) - Positive RF, positive anti-CCP, elevated ESR: She is having persistent pain and inflammation in the right hand and right wrist joint. She has started to have discomfort in the left hand/wrist recently.  She has been taking Plaquenil 200 mg 1 tablet by mouth twice daily and has not missed any doses. She is having an inadequate response to PLQ. A prednisone taper starting at 20 mg tapering by 5 mg every 7 days was sent to the pharmacy yesterday.  Due to being a diabetic she was advised to reduce prednisone to 10 mg for 1 week and to taper by 2.5 mg every  week.  She was advised to monitor her blood glucose closely and discuss with PCP if it continues to remain elevated.  We will add on Methotrexate to her current regimen. Indications, contraindications, and potential side effects of MTX were discussed.  All questions were  addressed.  She will come by the office to sign the consent form and receive an informational handout. Once the consent form is signed we will send in methotrexate 6 tablets po once weekly x2 weeks and if labs are stable at that time she will increase to 8 tablets by mouth once weekly.  She will take folic acid 2 mg po daily.  She will follow up in 4-6 weeks.    Drug Counseling Chest-xray: normal on 05/25/17 Alcohol use: Discussed  Patient was counseled on the purpose, proper use, and adverse effects of methotrexate including nausea, infection, and signs and symptoms of pneumonitis.  Reviewed instructions with patient to take methotrexate weekly along with folic acid daily.  Discussed the importance of frequent monitoring of kidney and liver function and blood counts, and provided patient with standing lab instructions.  Counseled patient to avoid NSAIDs and alcohol while on methotrexate.  Provided patient with educational materials on methotrexate and answered all questions.  Advised patient to get annual influenza vaccine and to get a pneumococcal vaccine if patient has not already had one.  Patient voiced understanding.  Patient consented to methotrexate use.  Will upload into chart.    High risk medication use -Plaquenil 200 mg 1 tablet by mouth twice daily.  Last Plaquenil eye exam normal on 12/15/2018.  She will add on methotrexate 6 tablets po once weekly x2 weeks and if labs are stable in 2 weeks she will increased to MTX 8 tablets po once weekly. She will start taking folic acid 2 mg po daily.    12/24/18: G6PD WNL, immunoglobulins WNL, HIV negative, TB gold negative, Hep B and hep C negative, IFE: no monoclonal protein detected. CXR did not reveal any active cardiopulmonary disease on 05/25/17. She will be due to update lab work in 2 weeks x2, then 2 months, then every 3 months. Standing orders were placed today.  Primary osteoarthritis of both feet: She has intermittent pain in both ankle joints.    Other medical conditions are listed as follows:  Essential hypertension  Hyperlipidemia associated with type 2 diabetes mellitus ([place])  Diabetes mellitus type 2 with complications ([place])  History of hypothyroidism  Osteopenia, unspecified location  Follow Up Instructions: She will follow up in 4-6 weeks   I discussed the assessment and treatment plan with the patient. The patient was provided an opportunity to ask questions and all were answered. The patient agreed with the plan and demonstrated an understanding of the instructions.   The patient was advised to call back or seek an in-person evaluation if the symptoms worsen or if the condition fails to improve as anticipated.  I provided 25 minutes of non-face-to-face time during this encounter.   Bo Merino, MD   Scribed by-  Marissa Sams, PA-C

## 2019-03-04 MED ORDER — FOLIC ACID 1 MG PO TABS: 2.0000 mg | ORAL_TABLET | Freq: Every day | ORAL | 1 refills | Status: DC

## 2019-03-04 MED ORDER — METHOTREXATE 2.5 MG PO TABS: ORAL_TABLET | ORAL | 0 refills | Status: DC

## 2019-03-04 NOTE — Addendum Note (Signed)
Addended by: Earnestine Mealing on: 03/04/2019 08:08 AM   Modules accepted: Orders

## 2019-03-05 ENCOUNTER — Telehealth: Payer: Self-pay | Admitting: Rheumatology

## 2019-03-05 NOTE — Telephone Encounter (Signed)
Patient left a message stating she was to start a new regimen of medication. The pharmacy has not gotten all the medication in as of yet. Patient will be starting a day late. Patient wants to know if this is okay? Please call to advise.

## 2019-03-05 NOTE — Telephone Encounter (Addendum)
Spoke with patient and advised patient she would be ok to start methotrexate today, advised patient to take 6 tablets today, 6 tablets next Friday and then have labs drawn. Advised patient to start taking folic acid 2mg  daily as of today. Patient verbalized understanding.

## 2019-03-05 NOTE — Telephone Encounter (Signed)
Please see previous message.  I suppose patient is asking about methotrexate prescription.  Should be okay if she will be starting it to daily.  Please clarify with the patient is only once a week.

## 2019-03-10 ENCOUNTER — Other Ambulatory Visit: Payer: Self-pay | Admitting: Endocrinology

## 2019-03-11 ENCOUNTER — Other Ambulatory Visit: Payer: Self-pay | Admitting: Endocrinology

## 2019-03-17 ENCOUNTER — Telehealth: Payer: Self-pay | Admitting: Internal Medicine

## 2019-03-17 ENCOUNTER — Telehealth: Payer: Self-pay | Admitting: Rheumatology

## 2019-03-17 ENCOUNTER — Other Ambulatory Visit: Payer: Self-pay

## 2019-03-17 DIAGNOSIS — Z79899 Other long term (current) drug therapy: Secondary | ICD-10-CM

## 2019-03-17 NOTE — Telephone Encounter (Signed)
Lab orders have been faxed to PCP.

## 2019-03-17 NOTE — Telephone Encounter (Signed)
Copied from [place] [id number]. Topic: General - Other >> Mar 17, 2019  2:16 PM Keene Breath wrote: Reason for CRM: Call patient back regarding getting labs done that were prescribed by another doctor.  CB# [phone]

## 2019-03-17 NOTE — Telephone Encounter (Signed)
Patient states she is trying to get an appointment with her PCP to get labs drawn. I advised patient she can get labs drawn there if they allow or the [facility] on [address]. Patient is skeptical about going to [facility] due to "not wanting her information all over the city." I advised [facility] as [facility] with [facility].  Patient is skeptical about taking methorexate and getting her labs done. Patient demanded to be seen in the office, I advised patient we are currently offering virtual visits due to the rise in [place] cases and positive cases amongst our staff. Patient is adamant about having an in office appointment or she "will just stop the medication." Patient declined a virtual visit and states "if the doctor cares enough, she will make me an in office appointment." please advise.

## 2019-03-17 NOTE — Telephone Encounter (Signed)
Patient called requesting her labwork orders be sent to her PCP office Dr. Pricilla Holm at [facility].  Patient states she will be going tomorrow 03/18/19.

## 2019-03-17 NOTE — Telephone Encounter (Signed)
I returned patient's call and assured her that [facility] lab would be a good location for her to have blood work.  She is convinced and will go for blood work Architectural technologist.  I also discussed that we will call her back with the lab results.  She would like to come in sooner than end of January for the follow-up visit.  Please schedule an appointment in the second week of January.

## 2019-03-17 NOTE — Telephone Encounter (Signed)
Called patient back states that Dr. Estanislado Pandy needs to check labs (CBC w/diff, and CMP w/ GFR) to see if she needs to increase or decrease the methotrexate. Patient was wanting to know if she can get these drawn in our lab when she also gets her follow up labs done.

## 2019-03-18 NOTE — Telephone Encounter (Signed)
We cannot do labs from another provider at our lab. Usually all doctors have a lab where they advise their patients to get labs done they have ordered. This way that provider gets the results quickly as to not delay your care. Call Dr. Estanislado Pandy to ask about the labs being drawn.

## 2019-03-18 NOTE — Telephone Encounter (Signed)
Tried calling patient no answer, kept doing this weird beeping noise. Routing to [facility] incase patient calls back

## 2019-03-23 ENCOUNTER — Other Ambulatory Visit: Payer: Self-pay

## 2019-03-23 ENCOUNTER — Ambulatory Visit (INDEPENDENT_AMBULATORY_CARE_PROVIDER_SITE_OTHER): Payer: Medicare Other | Admitting: Internal Medicine

## 2019-03-23 ENCOUNTER — Encounter: Payer: Self-pay | Admitting: Internal Medicine

## 2019-03-23 VITALS — BP 142/84 | HR 80 | Temp 98.0°F | Ht 65.0 in | Wt 248.0 lb

## 2019-03-23 DIAGNOSIS — D649 Anemia, unspecified: Secondary | ICD-10-CM | POA: Diagnosis not present

## 2019-03-23 DIAGNOSIS — Z79899 Other long term (current) drug therapy: Secondary | ICD-10-CM | POA: Diagnosis not present

## 2019-03-23 DIAGNOSIS — E538 Deficiency of other specified B group vitamins: Secondary | ICD-10-CM

## 2019-03-23 LAB — CBC WITH DIFFERENTIAL/PLATELET
Basophils Absolute: 0.1 10*3/uL (ref 0.0–0.1)
Basophils Relative: 0.8 % (ref 0.0–3.0)
Eosinophils Absolute: 0.2 10*3/uL (ref 0.0–0.7)
Eosinophils Relative: 1.5 % (ref 0.0–5.0)
HCT: 33 % — ABNORMAL LOW (ref 36.0–46.0)
Hemoglobin: 10.4 g/dL — ABNORMAL LOW (ref 12.0–15.0)
Lymphocytes Relative: 23 % (ref 12.0–46.0)
Lymphs Abs: 2.9 10*3/uL (ref 0.7–4.0)
MCHC: 31.5 g/dL (ref 30.0–36.0)
MCV: 77.9 fl — ABNORMAL LOW (ref 78.0–100.0)
Monocytes Absolute: 0.7 10*3/uL (ref 0.1–1.0)
Monocytes Relative: 5.5 % (ref 3.0–12.0)
Neutro Abs: 8.6 10*3/uL — ABNORMAL HIGH (ref 1.4–7.7)
Neutrophils Relative %: 69.2 % (ref 43.0–77.0)
Platelets: 312 10*3/uL (ref 150.0–400.0)
RBC: 4.24 Mil/uL (ref 3.87–5.11)
RDW: 18.5 % — ABNORMAL HIGH (ref 11.5–15.5)
WBC: 12.4 10*3/uL — ABNORMAL HIGH (ref 4.0–10.5)

## 2019-03-23 LAB — COMPREHENSIVE METABOLIC PANEL
ALT: 17 U/L (ref 0–35)
AST: 12 U/L (ref 0–37)
Albumin: 4.1 g/dL (ref 3.5–5.2)
Alkaline Phosphatase: 41 U/L (ref 39–117)
BUN: 18 mg/dL (ref 6–23)
CO2: 31 mEq/L (ref 19–32)
Calcium: 9.6 mg/dL (ref 8.4–10.5)
Chloride: 101 mEq/L (ref 96–112)
Creatinine, Ser: 0.82 mg/dL (ref 0.40–1.20)
GFR: 83.02 mL/min (ref >60.00)
Glucose, Bld: 131 mg/dL — ABNORMAL HIGH (ref 70–99)
Potassium: 4.2 mEq/L (ref 3.5–5.1)
Sodium: 139 mEq/L (ref 135–145)
Total Bilirubin: 0.2 mg/dL (ref 0.2–1.2)
Total Protein: 7 g/dL (ref 6.0–8.3)

## 2019-03-23 LAB — FERRITIN: Ferritin: 7.1 ng/mL — ABNORMAL LOW (ref 10.0–291.0)

## 2019-03-23 LAB — VITAMIN B12: Vitamin B-12: 268 pg/mL (ref 211–911)

## 2019-03-23 NOTE — Patient Instructions (Signed)
We will check the labs today and make sure that Dr. Estanislado Pandy will see them.

## 2019-03-23 NOTE — Assessment & Plan Note (Signed)
Checking CMP and CBC with differential due to plaquenil and methotrexate to consolidate lab visits and draws.

## 2019-03-23 NOTE — Progress Notes (Signed)
   Subjective:   Patient ID: Marissa Roberts, female    DOB: 1945/12/13, 73 y.o.   MRN: [id number]  HPI The patient is a 73 YO female coming in for follow up of her RA (has concerns about methotrexate and plaquenil, is due for labs with rheumatology and could not get them done and [facility] but wants to do them today for monitoring if able to avoid extra exposure to covid-19 and needle sticks, stable without current joint pain or inflamation) and anemia (new recently, obtained colonoscopy records after last visit and there were 3 polyps removed in 2018 with recommendation for repeat in 3 years, denies blood in stool, denies nosebleeds, is taking multivitamin daily, has not started iron pills) and B12 deficiency (taking multivitamin with B12 in it daily, denies numbness or weakness, feels same as before no change in energy level).   Review of Systems  Constitutional: Negative.   HENT: Negative.   Eyes: Negative.   Respiratory: Negative for cough, chest tightness and shortness of breath.   Cardiovascular: Negative for chest pain, palpitations and leg swelling.  Gastrointestinal: Negative for abdominal distention, abdominal pain, constipation, diarrhea, nausea and vomiting.  Musculoskeletal: Negative.   Skin: Negative.   Neurological: Negative.   Psychiatric/Behavioral: Negative.     Objective:  Physical Exam Constitutional:      Appearance: She is well-developed. She is obese.  HENT:     Head: Normocephalic and atraumatic.  Cardiovascular:     Rate and Rhythm: Normal rate and regular rhythm.  Pulmonary:     Effort: Pulmonary effort is normal. No respiratory distress.     Breath sounds: Normal breath sounds. No wheezing or rales.  Abdominal:     General: Bowel sounds are normal. There is no distension.     Palpations: Abdomen is soft.     Tenderness: There is no abdominal tenderness. There is no rebound.  Musculoskeletal:        General: No swelling or  tenderness.     Cervical back: Normal range of motion.  Skin:    General: Skin is warm and dry.  Neurological:     Mental Status: She is alert and oriented to person, place, and time.     Coordination: Coordination normal.     Vitals:   03/23/19 0922  BP: (!) 142/84  Pulse: 80  Temp: 98 F (36.7 C)  TempSrc: Oral  SpO2: 98%  Weight: 248 lb (112.5 kg)  Height: 5\' 5"  (1.651 m)    This visit occurred during the SARS-CoV-2 public health emergency.  Safety protocols were in place, including screening questions prior to the visit, additional usage of staff PPE, and extensive cleaning of exam room while observing appropriate contact time as indicated for disinfecting solutions.   Assessment & Plan:

## 2019-03-23 NOTE — Assessment & Plan Note (Signed)
Taking multivitamin and checking B12 today. Adjust as needed.

## 2019-03-23 NOTE — Assessment & Plan Note (Signed)
Checking CBC with diff and B12 and ferritin. Colonoscopy up to date. On no blood thinner.

## 2019-03-24 ENCOUNTER — Ambulatory Visit: Payer: Medicare Other | Admitting: Podiatry

## 2019-03-31 ENCOUNTER — Other Ambulatory Visit: Payer: Self-pay | Admitting: Endocrinology

## 2019-04-01 ENCOUNTER — Telehealth: Payer: Self-pay

## 2019-04-01 MED ORDER — METHOTREXATE 2.5 MG PO TABS: ORAL_TABLET | ORAL | 0 refills | Status: DC

## 2019-04-01 NOTE — Telephone Encounter (Signed)
Ok to refill MTX

## 2019-04-01 NOTE — Telephone Encounter (Signed)
Patient requested refill of Methotrexate.   Last Visit: 03/03/2019 Next Visit: 04/30/2019 Labs: 03/23/2019 glucose 131, WBC 12.4, hemoglobin 10.4, HCT 33.0, MCV 77.9, RDW 18.5, neutro abs 8.6.  Patient states she has been taking methotrexate 6 tablets weekly and has not increased to 8 tablets. I advised patient to increase to 8 tablets weekly for 2 weeks and then re-check labs.   Okay to refill methotrexate?

## 2019-04-06 ENCOUNTER — Other Ambulatory Visit: Payer: Self-pay

## 2019-04-06 ENCOUNTER — Telehealth: Payer: Self-pay | Admitting: Endocrinology

## 2019-04-06 MED ORDER — DILTIAZEM HCL ER BEADS 360 MG PO CP24: 360.0000 mg | ORAL_CAPSULE | Freq: Every day | ORAL | 1 refills | Status: DC

## 2019-04-06 NOTE — Progress Notes (Signed)
Office Visit Note  Patient: Marissa Roberts             Date of Birth: 1945-05-13           MRN: [id number]             PCP: [name], MD Referring: [name], * Visit Date: 04/07/2019 Occupation: @GUAROCC @  Subjective:  Pain in both hands   History of Present Illness: Marissa Roberts is a 74 y.o. female with history of seropositive rheumatoid arthritis and osteoarthritis. She is taking plaquenil 200 mg 1 tablet by mouth twice daily and MTX 8 tablets by mouth once weekly. She will be taking her second dose of 8 tablets of MTX in 2 days and will update lab work next week.  She has not missed any doses of these medications recently. She is having pain in both hands but has noticed improvement since starting on MTX. She denies any joint swelling.   Activities of Daily Living:  Patient reports morning stiffness for 0  minutes.   Patient Reports nocturnal pain.  Difficulty dressing/grooming: Denies Difficulty climbing stairs: Denies Difficulty getting out of chair: Denies Difficulty using hands for taps, buttons, cutlery, and/or writing: Reports  Review of Systems  Constitutional: Negative for fatigue.  HENT: Positive for sore tongue. Negative for mouth sores, mouth dryness and nose dryness.   Eyes: Negative for pain, visual disturbance and dryness.  Respiratory: Negative for cough, hemoptysis, shortness of breath and difficulty breathing.   Cardiovascular: Negative for chest pain, palpitations, hypertension and swelling in legs/feet.  Gastrointestinal: Positive for constipation. Negative for blood in stool and diarrhea.  Endocrine: Negative for increased urination.  Genitourinary: Negative for painful urination.  Musculoskeletal: Positive for arthralgias, joint pain and joint swelling. Negative for myalgias, muscle weakness, morning stiffness, muscle tenderness and myalgias.  Skin: Negative for color change, pallor, rash, hair loss, nodules/bumps, skin  tightness, ulcers and sensitivity to sunlight.  Allergic/Immunologic: Negative for susceptible to infections.  Neurological: Negative for dizziness, numbness, headaches, memory loss and weakness.  Hematological: Negative for swollen glands.  Psychiatric/Behavioral: Negative for depressed mood, confusion and sleep disturbance. The patient is not nervous/anxious.     PMFS History:  Patient Active Problem List   Diagnosis Date Noted  . B12 deficiency 03/23/2019  . Anemia 02/05/2019  . Rheumatoid arthritis with rheumatoid factor of multiple sites without organ or systems involvement ([place]) 01/25/2019  . High risk medication use 01/25/2019  . Fatigue 09/18/2018  . Routine general medical examination at a health care facility 02/07/2015  . Morbid obesity ([place]) 04/19/2013  . Hyperlipidemia associated with type 2 diabetes mellitus ([place]) 07/28/2009  . Osteopenia 03/14/2009  . Diabetes mellitus type 2 with complications ([place]) 0000000  . Hypothyroidism 09/24/2006  . Essential hypertension 09/24/2006    Past Medical History:  Diagnosis Date  . Arthritis   . Bronchitis    hx of  . CHF (congestive heart failure) ([place])   . Diabetes mellitus   . GERD (gastroesophageal reflux disease)   . Hypercholesteremia   . Hypertension   . Hypothyroidism   . Pneumonia    hx of  . PONV (postoperative nausea and vomiting)   . Thyroid disease     Family History  Problem Relation Age of Onset  . Diabetes Mother   . Heart disease Mother   . Hyperlipidemia Mother   . Diabetes Father   . Diabetes Sister   . Hypertension Sister   . Heart disease Sister   .  Dementia Sister   . Heart disease Brother   . Healthy Daughter   . Healthy Daughter   . Healthy Daughter   . Healthy Son    Past Surgical History:  Procedure Laterality Date  . ABDOMINAL HYSTERECTOMY     partial  . BREAST SURGERY     lumpectomy left breast  . carpel tunnel    . COLONOSCOPY W/ POLYPECTOMY    . FOOT SURGERY    . knee  arhtroscopy    . SHOULDER ARTHROSCOPY WITH SUBACROMIAL DECOMPRESSION, ROTATOR CUFF REPAIR AND BICEP TENDON REPAIR Right 05/26/2012   Procedure: Right Shoulder Diagnostic Operative Arthroscopy, Subacromial Decompression, Biceps Tenodesis, Mini Open Rotator Cuff Repair;  Surgeon: [name], MD;  Location: [facility];  Service: Orthopedics;  Laterality: Right;  Right Shoulder Diagnostic Operative Arthroscopy, Subacromial Decompression, Biceps Tenodesis, Mini Open Rotator Cuff Repair  . TONSILLECTOMY     Social History   Social History Narrative  . Not on file   Immunization History  Administered Date(s) Administered  . Fluad Quad(high Dose 65+) 01/02/2019  . Influenza Split 01/03/2011  . Influenza Whole 04/01/2005, 12/18/2009  . Influenza, High Dose Seasonal PF 01/07/2014, 01/27/2015, 01/02/2016, 01/13/2017, 01/16/2018  . Influenza-Unspecified 11/30/2012  . Pneumococcal Conjugate-13 09/12/2016, 01/02/2019  . Pneumococcal Polysaccharide-23 07/15/2014  . Td 04/01/2001, 10/07/2013     Objective: Vital Signs: BP (!) 142/78 (BP Location: Left Wrist, Patient Position: Sitting, Cuff Size: Normal)   Pulse 84   Resp 16   Ht 5\' 5"  (1.651 m)   Wt 250 lb (113.4 kg)   BMI 41.60 kg/m    Physical Exam Vitals and nursing note reviewed.  Constitutional:      Appearance: She is well-developed.  HENT:     Head: Normocephalic and atraumatic.  Eyes:     Conjunctiva/sclera: Conjunctivae normal.  Cardiovascular:     Rate and Rhythm: Normal rate and regular rhythm.     Heart sounds: Normal heart sounds.  Pulmonary:     Effort: Pulmonary effort is normal.     Breath sounds: Normal breath sounds.  Abdominal:     General: Bowel sounds are normal.     Palpations: Abdomen is soft.  Musculoskeletal:     Cervical back: Normal range of motion.  Lymphadenopathy:     Cervical: No cervical adenopathy.  Skin:    General: Skin is warm and dry.     Capillary Refill: Capillary refill takes less than 2  seconds.  Neurological:     Mental Status: She is alert and oriented to person, place, and time.  Psychiatric:        Behavior: Behavior normal.      Musculoskeletal Exam: C-spine, thoracic spine, and lumbar spine good ROM.  No midline spinal tenderness.  No SI joint tenderness.  Shoulder joints, elbow joints, wrist joints good ROM with no tenderness or synovitis.  Tenderness of right 3rd MCP and 2nd PIP joint. PIP and DIP synovial thickening consistent with osteoarthritis of both hands.  Hip joints, knee joints, ankle joints, MTP, PIPs, and DIPs good ROM with no synovitis.  No warmth or effusion of knee joints. Mild pedal edema bilaterally.   CDAI Exam: CDAI Score: 0.8  Patient Global: 4 mm; Provider Global: 4 mm Swollen: 0 ; Tender: 0  Joint Exam 04/07/2019   No joint exam has been documented for this visit   There is currently no information documented on the homunculus. Go to the Rheumatology activity and complete the homunculus joint exam.  Investigation: No  additional findings.  Imaging: No results found.  Recent Labs: Lab Results  Component Value Date   WBC 12.4 (H) 03/23/2019   HGB 10.4 (L) 03/23/2019   PLT 312.0 03/23/2019   NA 139 03/23/2019   K 4.2 03/23/2019   CL 101 03/23/2019   CO2 31 03/23/2019   GLUCOSE 131 (H) 03/23/2019   BUN 18 03/23/2019   CREATININE 0.82 03/23/2019   BILITOT 0.2 03/23/2019   ALKPHOS 41 03/23/2019   AST 12 03/23/2019   ALT 17 03/23/2019   PROT 7.0 03/23/2019   ALBUMIN 4.1 03/23/2019   CALCIUM 9.6 03/23/2019   GFRAA >90 05/19/2012   QFTBGOLDPLUS NEGATIVE 12/24/2018    Speciality Comments: PLQ Eye Exam: 12/15/18 WNL @ [facility] follow up in 6 months  Procedures:  No procedures performed Allergies: Contrast media [iodinated diagnostic agents], Adhesive [tape], Erythromycin, and Penicillins   Assessment / Plan:     Visit Diagnoses: Rheumatoid arthritis involving multiple sites with positive rheumatoid factor ([place]) - She  has started to notice improvement in her symptoms since starting on combination therapy.  She continues to have pain in both hands but no synovitis was noted on exam.  She has tenderness of the right 3rd MCP joint and 2nd PIP. She is taking plaquenil 200 mg 1 tablet by mouth twice daily and Methotrexate 8 tablets by mouth once weekly.  Her second dose of MTX 8 tablets will be in 2 days.  She is tolerating MTX without any side effects. She will be updating lab work next week. She will continue on the current treatment regimen.  She was advised to notify us if she develops increased joint pain or joint swelling.  She will follow up in 3 months.   High risk medication use - Plaquenil 200 mg 1 tablet by mouth twice daily, MTX 8 tablets po once weekly, and folic acid 2 mg po daily. Last Plaquenil eye exam normal on 12/15/2018.  CBC and CMP were drawn on 03/23/19.  She will be updating lab work next week since she will have been on MTX 8 tablets for 2 weeks at that point.    Primary osteoarthritis of both feet: She is not having any feet pain or inflammation at this time.   Other medical conditions are listed as follows:   Essential hypertension  Hyperlipidemia associated with type 2 diabetes mellitus ([place])  Diabetes mellitus type 2 with complications ([place])  History of hypothyroidism  Osteopenia, unspecified location  Orders: No orders of the defined types were placed in this encounter.  No orders of the defined types were placed in this encounter.   Face-to-face time spent with patient was 15 minutes. Greater than 50% of time was spent in counseling and coordination of care.  Follow-Up Instructions: Return in about 3 months (around 07/06/2019) for Rheumatoid arthritis, Osteoarthritis.   Bo Merino, MD   Scribed by-  [name], PA-C  Note - This record has been created using Dragon software.  Chart creation errors have been sought, but may not always  have been located. Such  creation errors do not reflect on  the standard of medical care.

## 2019-04-06 NOTE — Telephone Encounter (Signed)
That is correct 

## 2019-04-06 NOTE — Telephone Encounter (Signed)
Per last office visit note, pt is to be switched from Amlodipine to Diltiazem ER 360mg  PO QD. This Rx has been sent.

## 2019-04-06 NOTE — Telephone Encounter (Signed)
Patient called to advise that she is almost out of the Amlodipine.  Per patient Dr Dwyane Dee advised her to finish the Amlodipine and then call so that we can send her new medication to [facility]. Patient did not know the name of the new medication.  Call back number is [phone]

## 2019-04-07 ENCOUNTER — Ambulatory Visit (INDEPENDENT_AMBULATORY_CARE_PROVIDER_SITE_OTHER): Payer: BC Managed Care – PPO | Admitting: Rheumatology

## 2019-04-07 ENCOUNTER — Other Ambulatory Visit: Payer: Self-pay

## 2019-04-07 ENCOUNTER — Encounter: Payer: Self-pay | Admitting: Rheumatology

## 2019-04-07 VITALS — BP 142/78 | HR 84 | Resp 16 | Ht 65.0 in | Wt 250.0 lb

## 2019-04-07 DIAGNOSIS — Z79899 Other long term (current) drug therapy: Secondary | ICD-10-CM | POA: Diagnosis not present

## 2019-04-07 DIAGNOSIS — I10 Essential (primary) hypertension: Secondary | ICD-10-CM | POA: Diagnosis not present

## 2019-04-07 DIAGNOSIS — M0579 Rheumatoid arthritis with rheumatoid factor of multiple sites without organ or systems involvement: Secondary | ICD-10-CM | POA: Diagnosis not present

## 2019-04-07 DIAGNOSIS — E118 Type 2 diabetes mellitus with unspecified complications: Secondary | ICD-10-CM

## 2019-04-07 DIAGNOSIS — M19071 Primary osteoarthritis, right ankle and foot: Secondary | ICD-10-CM | POA: Diagnosis not present

## 2019-04-07 DIAGNOSIS — M19072 Primary osteoarthritis, left ankle and foot: Secondary | ICD-10-CM

## 2019-04-07 DIAGNOSIS — Z8639 Personal history of other endocrine, nutritional and metabolic disease: Secondary | ICD-10-CM

## 2019-04-07 DIAGNOSIS — M8580 Other specified disorders of bone density and structure, unspecified site: Secondary | ICD-10-CM

## 2019-04-07 DIAGNOSIS — E785 Hyperlipidemia, unspecified: Secondary | ICD-10-CM

## 2019-04-07 DIAGNOSIS — E1169 Type 2 diabetes mellitus with other specified complication: Secondary | ICD-10-CM

## 2019-04-07 NOTE — Patient Instructions (Signed)
Standing Labs We placed an order today for your standing lab work.    Please come back and get your standing labs in 1 week, 2 months, then every 3 months   We have open lab daily Monday through Thursday from 8:30-12:30 PM and 1:30-4:30 PM and Friday from 8:30-12:30 PM and 1:30-4:00 PM at the office of Dr. Bo Merino.   You may experience shorter wait times on Monday and Friday afternoons. The office is located at [address] [place], [place], [place] [address] No appointment is necessary.   Labs are drawn by [facility].  You may receive a bill from [facility] for your lab work.  If you wish to have your labs drawn at another location, please call the office 24 hours in advance to send orders.  If you have any questions regarding directions or hours of operation,  please call [phone].   Just as a reminder please drink plenty of water prior to coming for your lab work. Thanks!

## 2019-04-09 ENCOUNTER — Telehealth: Payer: Self-pay | Admitting: Endocrinology

## 2019-04-09 NOTE — Telephone Encounter (Signed)
She needs to be taking both.  Diltiazem is only replacing amlodipine

## 2019-04-09 NOTE — Telephone Encounter (Signed)
Called pt and gave her MD message. Pt verbalized understanding and stated that she would continue taking them, but she wanted to speak with Dr. Dwyane Dee about it during her next visit this month.

## 2019-04-09 NOTE — Telephone Encounter (Signed)
Patient called re: Patient requests to be called at ph# [phone] re: patient wants to know if she should be taking both  diltiazem (TIAZAC) 360 MG 24 hr capsule AND valsartan-hydrochlorothiazide (DIOVAN-HCT) 320-12.5 MG tablet. Please call to advise.

## 2019-04-13 ENCOUNTER — Other Ambulatory Visit: Payer: Self-pay | Admitting: *Deleted

## 2019-04-13 ENCOUNTER — Telehealth: Payer: Self-pay | Admitting: Rheumatology

## 2019-04-13 DIAGNOSIS — Z79899 Other long term (current) drug therapy: Secondary | ICD-10-CM

## 2019-04-13 NOTE — Telephone Encounter (Signed)
Lab Orders Released.

## 2019-04-13 NOTE — Telephone Encounter (Signed)
Patient left a voicemail requesting her labwork orders be sent to [facility].  Patient scheduled an appointment tomorrow 04/14/19.

## 2019-04-14 DIAGNOSIS — Z79899 Other long term (current) drug therapy: Secondary | ICD-10-CM | POA: Diagnosis not present

## 2019-04-14 LAB — COMPLETE METABOLIC PANEL WITH GFR
AG Ratio: 1.7 (calc) (ref 1.0–2.5)
ALT: 18 U/L (ref 6–29)
AST: 15 U/L (ref 10–35)
Albumin: 4 g/dL (ref 3.6–5.1)
Alkaline phosphatase (APISO): 47 U/L (ref 37–153)
BUN/Creatinine Ratio: 10 (calc) (ref 6–22)
BUN: 10 mg/dL (ref 7–25)
CO2: 29 mmol/L (ref 20–32)
Calcium: 9.9 mg/dL (ref 8.6–10.4)
Chloride: 105 mmol/L (ref 98–110)
Creat: 1.02 mg/dL — ABNORMAL HIGH (ref 0.60–0.93)
GFR, Est African American: 64 mL/min/{1.73_m2} (ref >=60)
GFR, Est Non African American: 55 mL/min/{1.73_m2} — ABNORMAL LOW (ref >=60)
Globulin: 2.4 g/dL (calc) (ref 1.9–3.7)
Glucose, Bld: 131 mg/dL — ABNORMAL HIGH (ref 65–99)
Potassium: 3.8 mmol/L (ref 3.5–5.3)
Sodium: 143 mmol/L (ref 135–146)
Total Bilirubin: 0.2 mg/dL (ref 0.2–1.2)
Total Protein: 6.4 g/dL (ref 6.1–8.1)

## 2019-04-14 LAB — CBC WITH DIFFERENTIAL/PLATELET
Absolute Monocytes: 1092 cells/uL — ABNORMAL HIGH (ref 200–950)
Basophils Absolute: 64 cells/uL (ref 0–200)
Basophils Relative: 0.5 %
Eosinophils Absolute: 229 cells/uL (ref 15–500)
Eosinophils Relative: 1.8 %
HCT: 31.1 % — ABNORMAL LOW (ref 35.0–45.0)
Hemoglobin: 10.2 g/dL — ABNORMAL LOW (ref 11.7–15.5)
Lymphs Abs: 3924 cells/uL — ABNORMAL HIGH (ref 850–3900)
MCH: 25.6 pg — ABNORMAL LOW (ref 27.0–33.0)
MCHC: 32.8 g/dL (ref 32.0–36.0)
MCV: 77.9 fL — ABNORMAL LOW (ref 80.0–100.0)
MPV: 10.9 fL (ref 7.5–12.5)
Monocytes Relative: 8.6 %
Neutro Abs: 7391 cells/uL (ref 1500–7800)
Neutrophils Relative %: 58.2 %
Platelets: 361 10*3/uL (ref 140–400)
RBC: 3.99 10*6/uL (ref 3.80–5.10)
RDW: 17.3 % — ABNORMAL HIGH (ref 11.0–15.0)
Total Lymphocyte: 30.9 %
WBC: 12.7 10*3/uL — ABNORMAL HIGH (ref 3.8–10.8)

## 2019-04-16 ENCOUNTER — Other Ambulatory Visit: Payer: Self-pay

## 2019-04-16 MED ORDER — METHOTREXATE 2.5 MG PO TABS: ORAL_TABLET | ORAL | 0 refills | Status: DC

## 2019-04-16 NOTE — Progress Notes (Signed)
Anemia is stable.  Please notify patient and forward to PCP.  WBC count is elevated-12.7.   Creatinine is borderline elevated but has trending up since starting on MTX.  GFR is WNL.   Please advise patient to reduce MTX to 0.7 ml sq once weekly.

## 2019-04-20 ENCOUNTER — Other Ambulatory Visit (HOSPITAL_COMMUNITY): Payer: Self-pay | Admitting: Obstetrics and Gynecology

## 2019-04-20 DIAGNOSIS — Z01419 Encounter for gynecological examination (general) (routine) without abnormal findings: Secondary | ICD-10-CM | POA: Diagnosis not present

## 2019-04-20 DIAGNOSIS — R52 Pain, unspecified: Secondary | ICD-10-CM

## 2019-04-20 DIAGNOSIS — E7800 Pure hypercholesterolemia, unspecified: Secondary | ICD-10-CM | POA: Insufficient documentation

## 2019-04-20 DIAGNOSIS — Z1272 Encounter for screening for malignant neoplasm of vagina: Secondary | ICD-10-CM | POA: Diagnosis not present

## 2019-04-20 DIAGNOSIS — M1990 Unspecified osteoarthritis, unspecified site: Secondary | ICD-10-CM | POA: Insufficient documentation

## 2019-04-20 DIAGNOSIS — Z6841 Body Mass Index (BMI) 40.0 and over, adult: Secondary | ICD-10-CM | POA: Diagnosis not present

## 2019-04-20 DIAGNOSIS — Z1231 Encounter for screening mammogram for malignant neoplasm of breast: Secondary | ICD-10-CM | POA: Diagnosis not present

## 2019-04-22 ENCOUNTER — Ambulatory Visit (HOSPITAL_COMMUNITY)
Admission: RE | Admit: 2019-04-22 | Discharge: 2019-04-22 | Disposition: A | Discharge status: Home / Self Care | Payer: Medicare Other | Source: Ambulatory Visit | Attending: Obstetrics and Gynecology | Admitting: Obstetrics and Gynecology

## 2019-04-22 ENCOUNTER — Other Ambulatory Visit: Payer: Self-pay

## 2019-04-22 ENCOUNTER — Other Ambulatory Visit: Payer: Self-pay | Admitting: Physician Assistant

## 2019-04-22 DIAGNOSIS — R52 Pain, unspecified: Secondary | ICD-10-CM | POA: Diagnosis not present

## 2019-04-22 DIAGNOSIS — M0579 Rheumatoid arthritis with rheumatoid factor of multiple sites without organ or systems involvement: Secondary | ICD-10-CM

## 2019-04-22 NOTE — Progress Notes (Signed)
Lower extremity venous has been completed.   Preliminary results in CV Proc.   Abram Sander 04/22/2019 11:12 AM

## 2019-04-22 NOTE — Telephone Encounter (Signed)
Last visit: 04/07/19 Next visit: 07/13/19 Labs: 04/14/19 Anemia is stable. WBC count is elevated-12.7. Creatinine is borderline elevated. GFR is WNL PLQ Eye Exam: 12/15/18 WNL  Okay to refill per Dr. Estanislado Pandy

## 2019-04-23 ENCOUNTER — Other Ambulatory Visit: Payer: Self-pay | Admitting: Endocrinology

## 2019-04-27 ENCOUNTER — Telehealth: Payer: Self-pay | Admitting: *Deleted

## 2019-04-27 ENCOUNTER — Other Ambulatory Visit: Payer: Self-pay

## 2019-04-27 ENCOUNTER — Telehealth: Payer: Self-pay | Admitting: Rheumatology

## 2019-04-27 ENCOUNTER — Other Ambulatory Visit: Payer: Self-pay | Admitting: Rheumatology

## 2019-04-27 ENCOUNTER — Other Ambulatory Visit (INDEPENDENT_AMBULATORY_CARE_PROVIDER_SITE_OTHER): Payer: BC Managed Care – PPO

## 2019-04-27 DIAGNOSIS — E1165 Type 2 diabetes mellitus with hyperglycemia: Secondary | ICD-10-CM | POA: Diagnosis not present

## 2019-04-27 DIAGNOSIS — Z794 Long term (current) use of insulin: Secondary | ICD-10-CM | POA: Diagnosis not present

## 2019-04-27 LAB — BASIC METABOLIC PANEL
BUN: 12 mg/dL (ref 6–23)
CO2: 29 mEq/L (ref 19–32)
Calcium: 9.8 mg/dL (ref 8.4–10.5)
Chloride: 103 mEq/L (ref 96–112)
Creatinine, Ser: 0.82 mg/dL (ref 0.40–1.20)
GFR: 83 mL/min (ref >60.00)
Glucose, Bld: 120 mg/dL — ABNORMAL HIGH (ref 70–99)
Potassium: 3.8 mEq/L (ref 3.5–5.1)
Sodium: 139 mEq/L (ref 135–145)

## 2019-04-27 LAB — HEMOGLOBIN A1C: Hgb A1c MFr Bld: 6.9 % — ABNORMAL HIGH (ref 4.6–6.5)

## 2019-04-27 NOTE — Telephone Encounter (Signed)
Returned patient call and documented in previous encounter.

## 2019-04-27 NOTE — Telephone Encounter (Signed)
Patient she started having lower back pain. Patient states she is also having " hair shedding". Patient states it has been happening for 2 weeks. Patient states she is on MTX 7 tabs weekly and PLQ 200 mg BID. Patient would like to know if these are related to her medication. Patient states she will be out of the house for the next hour and a half. Please advise.

## 2019-04-27 NOTE — Telephone Encounter (Signed)
Return patient call.  Husband answered and patient was not available.  He will her return our call.  Methotrexate and Plaquenil would not contribute to lower back pain.  Methotrexate continue because hair loss.  She can take a supplement such as biotin hair skin and nails or Nutrafol.     Mariella Saa, PharmD, [place], [place] Clinical Specialty Pharmacist [phone]  04/27/2019 9:22 AM

## 2019-04-27 NOTE — Telephone Encounter (Signed)
Patient returning your call.

## 2019-04-27 NOTE — Telephone Encounter (Signed)
Was able to reach patient.  Advised of recommendation below.  She states she also started a new blood pressure medication, diltiazem.  Advised that should not be contributing to her back pain but can cause fatigue.    Patient ended call abruptly to answer call from another provider.   Mariella Saa, PharmD, [place], [place] Clinical Specialty Pharmacist [phone]  04/27/2019 1:32 PM

## 2019-04-27 NOTE — Telephone Encounter (Signed)
Last visit: 04/07/19 Next visit: 07/13/19 Labs: 04/14/19 Anemia is stable. WBC count is elevated-12.7. Creatinine is borderline elevated. GFR is WNL  Okay to refill per Dr. Estanislado Pandy

## 2019-04-30 ENCOUNTER — Ambulatory Visit: Payer: Medicare Other | Admitting: Rheumatology

## 2019-05-03 ENCOUNTER — Other Ambulatory Visit: Payer: Self-pay

## 2019-05-04 ENCOUNTER — Other Ambulatory Visit: Payer: Self-pay | Admitting: *Deleted

## 2019-05-04 ENCOUNTER — Encounter: Payer: Self-pay | Admitting: Endocrinology

## 2019-05-04 ENCOUNTER — Ambulatory Visit (INDEPENDENT_AMBULATORY_CARE_PROVIDER_SITE_OTHER): Payer: Medicare Other | Admitting: Endocrinology

## 2019-05-04 VITALS — BP 160/80 | HR 84 | Ht 65.0 in | Wt 252.0 lb

## 2019-05-04 DIAGNOSIS — I10 Essential (primary) hypertension: Secondary | ICD-10-CM

## 2019-05-04 DIAGNOSIS — E039 Hypothyroidism, unspecified: Secondary | ICD-10-CM | POA: Diagnosis not present

## 2019-05-04 DIAGNOSIS — Z79899 Other long term (current) drug therapy: Secondary | ICD-10-CM

## 2019-05-04 DIAGNOSIS — D649 Anemia, unspecified: Secondary | ICD-10-CM | POA: Diagnosis not present

## 2019-05-04 DIAGNOSIS — Z794 Long term (current) use of insulin: Secondary | ICD-10-CM

## 2019-05-04 DIAGNOSIS — E1165 Type 2 diabetes mellitus with hyperglycemia: Secondary | ICD-10-CM | POA: Diagnosis not present

## 2019-05-04 LAB — COMPLETE METABOLIC PANEL WITH GFR
AG Ratio: 1.5 (calc) (ref 1.0–2.5)
ALT: 16 U/L (ref 6–29)
AST: 16 U/L (ref 10–35)
Albumin: 4.1 g/dL (ref 3.6–5.1)
Alkaline phosphatase (APISO): 46 U/L (ref 37–153)
BUN/Creatinine Ratio: 13 (calc) (ref 6–22)
BUN: 12 mg/dL (ref 7–25)
CO2: 31 mmol/L (ref 20–32)
Calcium: 10.2 mg/dL (ref 8.6–10.4)
Chloride: 103 mmol/L (ref 98–110)
Creat: 0.95 mg/dL — ABNORMAL HIGH (ref 0.60–0.93)
GFR, Est African American: 70 mL/min/{1.73_m2} (ref >=60)
GFR, Est Non African American: 60 mL/min/{1.73_m2} (ref >=60)
Globulin: 2.7 g/dL (calc) (ref 1.9–3.7)
Glucose, Bld: 122 mg/dL — ABNORMAL HIGH (ref 65–99)
Potassium: 4.3 mmol/L (ref 3.5–5.3)
Sodium: 142 mmol/L (ref 135–146)
Total Bilirubin: 0.2 mg/dL (ref 0.2–1.2)
Total Protein: 6.8 g/dL (ref 6.1–8.1)

## 2019-05-04 LAB — CBC WITH DIFFERENTIAL/PLATELET
Absolute Monocytes: 949 cells/uL (ref 200–950)
Basophils Absolute: 65 cells/uL (ref 0–200)
Basophils Relative: 0.5 %
Eosinophils Absolute: 299 cells/uL (ref 15–500)
Eosinophils Relative: 2.3 %
HCT: 33.1 % — ABNORMAL LOW (ref 35.0–45.0)
Hemoglobin: 10.5 g/dL — ABNORMAL LOW (ref 11.7–15.5)
Lymphs Abs: 3874 cells/uL (ref 850–3900)
MCH: 24.9 pg — ABNORMAL LOW (ref 27.0–33.0)
MCHC: 31.7 g/dL — ABNORMAL LOW (ref 32.0–36.0)
MCV: 78.6 fL — ABNORMAL LOW (ref 80.0–100.0)
MPV: 10.4 fL (ref 7.5–12.5)
Monocytes Relative: 7.3 %
Neutro Abs: 7813 cells/uL — ABNORMAL HIGH (ref 1500–7800)
Neutrophils Relative %: 60.1 %
Platelets: 380 10*3/uL (ref 140–400)
RBC: 4.21 10*6/uL (ref 3.80–5.10)
RDW: 17.5 % — ABNORMAL HIGH (ref 11.0–15.0)
Total Lymphocyte: 29.8 %
WBC: 13 10*3/uL — ABNORMAL HIGH (ref 3.8–10.8)

## 2019-05-04 MED ORDER — AMLODIPINE BESYLATE 5 MG PO TABS: 5.0000 mg | ORAL_TABLET | Freq: Every day | ORAL | 3 refills | Status: DC

## 2019-05-04 MED ORDER — FARXIGA 5 MG PO TABS: 5.0000 mg | ORAL_TABLET | Freq: Every day | ORAL | 3 refills | Status: DC

## 2019-05-04 NOTE — Patient Instructions (Addendum)
Check if u can get Tandem Iron pill  With Wilder Glade try only 1 pill of lasix   With farxiga try Tresiba 46 units  Change Tiazac to amlodipine again

## 2019-05-04 NOTE — Progress Notes (Signed)
Patient ID: Marissa Roberts, female   DOB: July 18, 1945, 74 y.o.   MRN: [id number]               Reason for Appointment:  Follow-up for diabetes and hypertension   History of Present Illness:             Type 2 diabetes mellitus, date of diagnosis: 2005       INSULIN regimen is  Tresiba 50 units am, Novolog  15-20 units bid- tid ac  Oral hypoglycemic drugs the patient is taking are: Metformin 2 g daily   Current blood sugar patterns, management and problems identified:  Her A1c is continuing to improve and now 6.9 compared to 7.3    He has started doing a few blood sugars in the evening before or after dinner  These are generally excellent  Also fasting readings are generally fairly good and averaging 123  No hypoglycemia also  However has gained weight and likely to be from lack of exercise and inconsistent control of calories including when eating out  Her Tyler Aas was reduced to 50 units on the last visit because of low normal fasting readings  She is still trying to take her NovoLog consistently with her meals in the right before or right after eating     Side effects from medications have been:none  Compliance with the medical regimen: Fair  Glucose monitoring:  done 1 time a day         Glucometer:  One Touch.      Blood Glucose readings from review of monitor download:   PRE-MEAL Fasting Lunch Dinner Bedtime Overall  Glucose range:  105-147    90, 127   Mean/median:  123   119   127   POST-MEAL PC Breakfast PC Lunch PC Dinner  Glucose range:     Mean/median:    115    PREVIOUS readings:  FASTING blood sugar range 78-132, median 88  Previous records: 90-168 with AVERAGE 120    Self-care:   Meals: 3 meals per day. Breakfast is usually grits with or without eggs or meat Not always restricting fat intake or sweet tea  when eating out        Exercise:  Minimal  Dietician visit, most recent:2014.               Weight history:  Wt Readings from  Last 3 Encounters:  05/04/19 252 lb (114.3 kg)  04/07/19 250 lb (113.4 kg)  03/23/19 248 lb (112.5 kg)    Glycemic control:   Lab Results  Component Value Date   HGBA1C 6.9 (H) 04/27/2019   HGBA1C 7.2 (H) 01/22/2019   HGBA1C 7.8 (H) 10/20/2018   Lab Results  Component Value Date   MICROALBUR 41.7 (H) 01/22/2019   LDLCALC 77 04/17/2018   CREATININE 0.82 04/27/2019   No results found for: FRUCTOSAMINE    Past history:  She thinks her blood sugars were only mildly increased at borderline levels at the onsetper Most likely she was treated with metformin initially and this has been continued.   At some point she was also tried on [place] and [place] for improving her control. She is not sure why she was started on insulin 5 years ago, presumably for worsening hyperglycemia Also not clear if she has tried various insulin regimens before starting Lantus and NovoLog  Previously had tried the V-go pump but subsequently she stopped doing the V-go pump because of the cost, skin irritation and local  discomfort   Allergies as of 05/04/2019      Reactions   Contrast Media [iodinated Diagnostic Agents]    Throat swells from ct con tra   Adhesive [tape]    No paper tape, causes blisters   Erythromycin    REACTION: rash   Penicillins    REACTION: rash      Medication List       Accurate as of May 04, 2019 11:56 AM. If you have any questions, ask your nurse or doctor.        amLODipine 5 MG tablet Commonly known as: NORVASC Take 1 tablet (5 mg total) by mouth daily. Started by: Elayne Snare, MD   BD Pen Needle Nano U/F 32G X 4 MM Misc Generic drug: Insulin Pen Needle USE  4 TIMES DAILY TO  INJECT  INSULIN   calcium carbonate 600 MG Tabs tablet Commonly known as: OS-CAL Take by mouth.   diltiazem 360 MG 24 hr capsule Commonly known as: TIAZAC Take 1 capsule (360 mg total) by mouth daily.   Farxiga 5 MG Tabs tablet Generic drug: dapagliflozin propanediol Take 5  mg by mouth daily. Started by: Elayne Snare, MD   fluticasone 50 MCG/ACT nasal spray Commonly known as: FLONASE SPRAY 2 SPRAYS INTO EACH NOSTRIL EVERY DAY What changed: See the new instructions.   folic acid 1 MG tablet Commonly known as: FOLVITE Take 2 tablets (2 mg total) by mouth daily.   furosemide 40 MG tablet Commonly known as: LASIX Take 2 tablets by mouth once daily   GLUCOSAMINE PO Take by mouth daily.   hydroxychloroquine 200 MG tablet Commonly known as: PLAQUENIL Take 1 tablet by mouth twice daily   levothyroxine 137 MCG tablet Commonly known as: SYNTHROID Take 1 tablet by mouth once daily   Linzess 290 MCG Caps capsule Generic drug: linaclotide Take 1 capsule (290 mcg total) by mouth daily. What changed:   when to take this  reasons to take this   metFORMIN 1000 MG tablet Commonly known as: GLUCOPHAGE TAKE 1 TABLET BY MOUTH TWICE DAILY WITH A MEAL   methotrexate 2.5 MG tablet Commonly known as: RHEUMATREX Take 7 tablets (17.5 mg total) by mouth once a week. TAKE 8 TABLETS BY MOUTH ONCE WEEKLY FOR 2 WEEKS AND THEN HAVE LABS DRAWN.  CAUTION:CHEMOTHERAPY. PROTECT FROM LIGHT   montelukast 10 MG tablet Commonly known as: SINGULAIR Take 1 tablet (10 mg total) by mouth at bedtime.   NovoLOG FlexPen 100 UNIT/ML FlexPen Generic drug: insulin aspart Inject 15-22 units under the skin three times daily before meals. What changed: additional instructions   OneTouch Delica Lancets 99991111 Misc USE TO CHECK BLOOD SUGAR UP TO THREE TIMES A DAY 2 HOURS AFTER A MEAL AND UP TO 3 TIMES A WEEK UPON WAKING UP   OneTouch Verio test strip Generic drug: glucose blood Use Onetouch verio test strips as instructed to check blood sugars 4 times daily. DX:E11.65   potassium chloride 10 MEQ tablet Commonly known as: KLOR-CON Take 1 tablet (10 mEq total) by mouth 2 (two) times daily.   rosuvastatin 40 MG tablet Commonly known as: CRESTOR Take 1 tablet by mouth once daily    Tresiba FlexTouch 200 UNIT/ML Sopn Generic drug: Insulin Degludec INJECT 54 UNITS SUBCUTANEOUSLY IN THE MORNING   valsartan-hydrochlorothiazide 320-12.5 MG tablet Commonly known as: DIOVAN-HCT Take 1 tablet by mouth daily.       Allergies:  Allergies  Allergen Reactions  . Contrast Media [Iodinated Diagnostic Agents]  Throat swells from ct con tra  . Adhesive [Tape]     No paper tape, causes blisters  . Erythromycin     REACTION: rash  . Penicillins     REACTION: rash    Past Medical History:  Diagnosis Date  . Arthritis   . Bronchitis    hx of  . CHF (congestive heart failure) ([place])   . Diabetes mellitus   . GERD (gastroesophageal reflux disease)   . Hypercholesteremia   . Hypertension   . Hypothyroidism   . Pneumonia    hx of  . PONV (postoperative nausea and vomiting)   . Thyroid disease     Past Surgical History:  Procedure Laterality Date  . ABDOMINAL HYSTERECTOMY     partial  . BREAST SURGERY     lumpectomy left breast  . carpel tunnel    . COLONOSCOPY W/ POLYPECTOMY    . FOOT SURGERY    . knee arhtroscopy    . SHOULDER ARTHROSCOPY WITH SUBACROMIAL DECOMPRESSION, ROTATOR CUFF REPAIR AND BICEP TENDON REPAIR Right 05/26/2012   Procedure: Right Shoulder Diagnostic Operative Arthroscopy, Subacromial Decompression, Biceps Tenodesis, Mini Open Rotator Cuff Repair;  Surgeon: [name], MD;  Location: [facility];  Service: Orthopedics;  Laterality: Right;  Right Shoulder Diagnostic Operative Arthroscopy, Subacromial Decompression, Biceps Tenodesis, Mini Open Rotator Cuff Repair  . TONSILLECTOMY      Family History  Problem Relation Age of Onset  . Diabetes Mother   . Heart disease Mother   . Hyperlipidemia Mother   . Diabetes Father   . Diabetes Sister   . Hypertension Sister   . Heart disease Sister   . Dementia Sister   . Heart disease Brother   . Healthy Daughter   . Healthy Daughter   . Healthy Daughter   . Healthy Son     Social  History:  reports that she quit smoking about 29 years ago. She has never used smokeless tobacco. She reports that she does not drink alcohol or use drugs.    Review of Systems   HYPERTENSION: She was on amlodipine 5 mg twice daily and Diovan HCT 320/12.5  Also has microalbuminuria and because of this as well as tendency to edema she was changed from amlodipine to Dyazide However she thinks that this is causing some constipation and hair loss Edema is better  She is not checking blood pressure at home  BP Readings from Last 3 Encounters:  05/04/19 (!) 160/80  04/07/19 (!) 142/78  03/23/19 (!) 142/84    On Lasix 80 mg for edema daily with good control   LIPIDS:  She is taking rosuvastatin 40 mg, previously LDL was high with simvastatin 20 mg LDL below 100 Crestor prescribed by PCP      Lab Results  Component Value Date   CHOL 157 04/17/2018   HDL 51.00 04/17/2018   LDLCALC 77 04/17/2018   LDLDIRECT 129.5 02/06/2012   TRIG 145.0 04/17/2018   CHOLHDL 3 04/17/2018                  Thyroid:      She has been hypothyroid for over 40 years and is taking a supplement of 137 g levothyroxine, 6 tablets per week Has not needed change in the dosage for some time She takes this before breakfast daily  TSH has been very consistently normal  Last labs:   Lab Results  Component Value Date   TSH 1.24 01/22/2019   TSH 0.63 07/21/2018  TSH 1.95 10/10/2017   FREET4 1.11 07/21/2018   FREET4 1.05 10/10/2017   FREET4 1.13 04/11/2017   Iron deficiency anemia: She has constipation from iron supplements and is only taking a multivitamin with unknown amount of iron  Lab Results  Component Value Date   HGB 10.2 (L) 04/14/2019     Physical Examination:  BP (!) 160/80   Pulse 84   Ht 5\' 5"  (1.651 m)   Wt 252 lb (114.3 kg)   SpO2 98%   BMI 41.93 kg/m   No ankle edema No significant alopecia seen  ASSESSMENT/PLAN:  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:  See history of  present illness for detailed discussion of current diabetes management, blood sugar patterns and problems identified  She is on basal bolus insulin and metformin  A1c is slightly better at 6.9, was 7.2 Blood sugars are fairly stable throughout the day and she has a few readings in the evening which are fairly good Also taking only 50 units of Tresiba compared to 26 previously Likely has had better diet with reduced portions although her weight has started to go back up again Still not exercising   Recommendations:  Check more blood sugars after lunch and dinner  She is a good candidate for medication like Wilder Glade because of her obesity, weight gain, albuminuria, poorly controlled hypertension Discussed action of SGLT 2 drugs on lowering glucose by decreasing kidney absorption of glucose, benefits of weight loss and lower blood pressure, possible side effects including candidiasis and dosage regimen   She will start with 5 mg of Farxiga and co-pay card given  Patient information was given on SGLT2 drugs  She will reduce Tresiba by 4 units with starting [place] and may need to reduce NovoLog also  She will try to reduce her LASIX down to 40 mg with starting [place] and increase fluid intake  She will need to follow-up in 6 weeks  HYPERTENSION with microalbuminuria: Blood pressure is high today  Although she thinks she is compliant with both her valsartan HCT and diltiazem blood pressure appears higher She would likely benefit from [place] Also she wants to go back on amlodipine and will first start with 5 mg daily   HYPOTHYROIDISM: her TSH is previously normal and will have her checked again on the next visit  IRON deficiency: Since her ferritin is significantly normal she can try taking the tandem brand iron OTC which will likely cause less constipation and may take it every other day at least  Discussed Covid vaccine and need for her to do this   Patient Instructions  Check  if u can get Tandem Iron pill  With Wilder Glade try only 1 pill of lasix   With farxiga try Tresiba 46 units  Change Tiazac to amlodipine again       Elayne Snare 05/04/2019, 11:56 AM   Note: This office note was prepared with Dragon voice recognition system technology. Any transcriptional errors that result from this process are unintentional.

## 2019-05-05 NOTE — Progress Notes (Signed)
Anemia is stable. WBC count is elevated but stable.  Creatinine is borderline elevated. GFR is WNL. Please advise patient to avoid NSAIDs. Glucose was 122.  We will continue to monitor lab work closely.

## 2019-05-06 ENCOUNTER — Other Ambulatory Visit: Payer: Self-pay | Admitting: Endocrinology

## 2019-05-07 ENCOUNTER — Other Ambulatory Visit: Payer: Self-pay | Admitting: *Deleted

## 2019-05-07 ENCOUNTER — Ambulatory Visit: Payer: Self-pay

## 2019-05-07 ENCOUNTER — Encounter: Payer: Self-pay | Admitting: Orthopedic Surgery

## 2019-05-07 ENCOUNTER — Other Ambulatory Visit: Payer: Self-pay

## 2019-05-07 ENCOUNTER — Ambulatory Visit (INDEPENDENT_AMBULATORY_CARE_PROVIDER_SITE_OTHER): Payer: Medicare Other | Admitting: Orthopedic Surgery

## 2019-05-07 DIAGNOSIS — M25562 Pain in left knee: Secondary | ICD-10-CM

## 2019-05-07 DIAGNOSIS — M5410 Radiculopathy, site unspecified: Secondary | ICD-10-CM

## 2019-05-07 MED ORDER — TRAMADOL HCL 50 MG PO TABS: ORAL_TABLET | ORAL | 0 refills | Status: DC

## 2019-05-07 MED ORDER — METHOTREXATE 2.5 MG PO TABS: 17.5000 mg | ORAL_TABLET | ORAL | 0 refills | Status: DC

## 2019-05-07 NOTE — Progress Notes (Signed)
Office Visit Note   Patient: Marissa Roberts           Date of Birth: 07-19-1945           MRN: [id number] Visit Date: 05/07/2019 Requested by: [name], MD [address] [place],  [place] [address] PCP: [name], MD  Subjective: Chief Complaint  Patient presents with  . Left Leg - Pain    HPI: Marissa Roberts is a patient with left leg pain.  Hard for her to weight-bear the past several days.  Reports primarily posterior pain with not much in terms of radiation from the back but it does come to some degree from the buttock region.  States that the leg gave way yesterday.  Denies any numbness and tingling no burning or cramping.  She does have a diagnosis of rheumatoid arthritis.  She had an ultrasound 2 weeks ago which was negative for any type of blood clot.  She is on oral medication for rheumatoid arthritis.  She is using a walker.              ROS: All systems reviewed are negative as they relate to the chief complaint within the history of present illness.  Patient denies  fevers or chills.   Assessment & Plan: Visit Diagnoses:  1. Radicular leg pain     Plan: Impression is left leg pain which is primarily focused around the knee.  She does have a mild effusion which is aspirated and sent for crystal analysis.  No fevers or chills.  Radiographs of the knee unremarkable.  No nerve root tension signs.  We will see her back in about a week to reassess and see how she did with the injection.  Tramadol prescribed.  Follow-Up Instructions: No follow-ups on file.   Orders:  Orders Placed This Encounter  Procedures  . XR Lumbar Spine 2-3 Views   No orders of the defined types were placed in this encounter.     Procedures: No procedures performed   Clinical Data: No additional findings.  Objective: Vital Signs: There were no vitals taken for this visit.  Physical Exam:   Constitutional: Patient appears well-developed HEENT:  Head:  Normocephalic Eyes:EOM are normal Neck: Normal range of motion Cardiovascular: Normal rate Pulmonary/chest: Effort normal Neurologic: Patient is alert Skin: Skin is warm Psychiatric: Patient has normal mood and affect    Ortho Exam: Ortho exam demonstrates mild warmth to the left knee compared to the right.  Mild effusion is present.  Extensor mechanism is intact.  No groin pain on the left-hand side with internal X rotation leg.  Pedal pulses palpable.  No other masses lymphadenopathy or skin changes noted in that left knee region.  Range of motion is full extension to about 95-100 of flexion.  No real popliteal cyst palpable in the back of the knee.  Specialty Comments:  No specialty comments available.  Imaging: XR Lumbar Spine 2-3 Views  Result Date: 05/07/2019 AP lateral lumbar spine reviewed.  Mild degenerative changes noted in the thoracolumbar junction.  No acute fracture or dislocation.  No spondylolisthesis.  Mild facet arthritis is present.    PMFS History: Patient Active Problem List   Diagnosis Date Noted  . B12 deficiency 03/23/2019  . Anemia 02/05/2019  . Rheumatoid arthritis with rheumatoid factor of multiple sites without organ or systems involvement ([facility]) 01/25/2019  . High risk medication use 01/25/2019  . Fatigue 09/18/2018  . Routine general medical examination at a health  care facility 02/07/2015  . Morbid obesity ([facility]) 04/19/2013  . Hyperlipidemia associated with type 2 diabetes mellitus ([facility]) 07/28/2009  . Osteopenia 03/14/2009  . Diabetes mellitus type 2 with complications ([facility]) 0000000  . Hypothyroidism 09/24/2006  . Essential hypertension 09/24/2006   Past Medical History:  Diagnosis Date  . Arthritis   . Bronchitis    hx of  . CHF (congestive heart failure) ([facility])   . Diabetes mellitus   . GERD (gastroesophageal reflux disease)   . Hypercholesteremia   . Hypertension   . Hypothyroidism   . Pneumonia    hx of  . PONV (postoperative  nausea and vomiting)   . Thyroid disease     Family History  Problem Relation Age of Onset  . Diabetes Mother   . Heart disease Mother   . Hyperlipidemia Mother   . Diabetes Father   . Diabetes Sister   . Hypertension Sister   . Heart disease Sister   . Dementia Sister   . Heart disease Brother   . Healthy Daughter   . Healthy Daughter   . Healthy Daughter   . Healthy Son     Past Surgical History:  Procedure Laterality Date  . ABDOMINAL HYSTERECTOMY     partial  . BREAST SURGERY     lumpectomy left breast  . carpel tunnel    . COLONOSCOPY W/ POLYPECTOMY    . FOOT SURGERY    . knee arhtroscopy    . SHOULDER ARTHROSCOPY WITH SUBACROMIAL DECOMPRESSION, ROTATOR CUFF REPAIR AND BICEP TENDON REPAIR Right 05/26/2012   Procedure: Right Shoulder Diagnostic Operative Arthroscopy, Subacromial Decompression, Biceps Tenodesis, Mini Open Rotator Cuff Repair;  Surgeon: [name], MD;  Location: [facility];  Service: Orthopedics;  Laterality: Right;  Right Shoulder Diagnostic Operative Arthroscopy, Subacromial Decompression, Biceps Tenodesis, Mini Open Rotator Cuff Repair  . TONSILLECTOMY     Social History   Occupational History  . Not on file  Tobacco Use  . Smoking status: Former Smoker    Quit date: 04/01/1990    Years since quitting: 29.1  . Smokeless tobacco: Never Used  Substance and Sexual Activity  . Alcohol use: No  . Drug use: No  . Sexual activity: Never    Birth control/protection: Abstinence

## 2019-05-08 LAB — SYNOVIAL CELL COUNT + DIFF, W/ CRYSTALS
Basophils, %: 0 %
Eosinophils-Synovial: 0 % (ref 0–2)
Lymphocytes-Synovial Fld: 21 % (ref 0–74)
Monocyte/Macrophage: 37 % (ref 0–69)
Neutrophil, Synovial: 42 % — ABNORMAL HIGH (ref 0–24)
Synoviocytes, %: 0 % (ref 0–15)
WBC, Synovial: 293 cells/uL — ABNORMAL HIGH (ref <150)

## 2019-05-14 ENCOUNTER — Ambulatory Visit (INDEPENDENT_AMBULATORY_CARE_PROVIDER_SITE_OTHER): Payer: Medicare Other | Admitting: Orthopedic Surgery

## 2019-05-14 ENCOUNTER — Other Ambulatory Visit: Payer: Self-pay

## 2019-05-14 ENCOUNTER — Telehealth: Payer: Self-pay

## 2019-05-14 DIAGNOSIS — M1712 Unilateral primary osteoarthritis, left knee: Secondary | ICD-10-CM | POA: Diagnosis not present

## 2019-05-14 NOTE — Telephone Encounter (Signed)
Submitted for VOB for synvisc one- left knee 

## 2019-05-14 NOTE — Telephone Encounter (Signed)
Can we get patient approved for left knee gel injection

## 2019-05-15 ENCOUNTER — Encounter: Payer: Self-pay | Admitting: Orthopedic Surgery

## 2019-05-15 NOTE — Progress Notes (Signed)
Office Visit Note   Patient: Marissa Roberts           Date of Birth: 1945-10-23           MRN: [id number] Visit Date: 05/14/2019 Requested by: [name], MD [address] [place],  [place] [address] PCP: [name], MD  Subjective: Chief Complaint  Patient presents with  . Left Knee - Follow-up    HPI: [name] is a 74 year old patient with rheumatoid arthritis and left knee pain.  Had aspiration 05/07/2019.  That was negative for crystals.  She is doing some better.  She is ambulating with a cane.  Denies much in the way of back pain or radiation of the pain.  She is taking tramadol for symptoms.  Plain radiographs show mild degenerative changes but no real bone-on-bone arthritis yet.              ROS: All systems reviewed are negative as they relate to the chief complaint within the history of present illness.  Patient denies  fevers or chills.   Assessment & Plan: Visit Diagnoses: No diagnosis found.  Plan: Impression is left knee pain with some improvement after aspiration last week.  No crystals present in the aspirate.  I like to preapproved for gel injection left knee to see if that could help if her symptoms recur.  She will come back when those symptoms recur and will do the aspiration then.  No mechanical symptoms today.  Follow-Up Instructions: No follow-ups on file.   Orders:  No orders of the defined types were placed in this encounter.  No orders of the defined types were placed in this encounter.     Procedures: No procedures performed   Clinical Data: No additional findings.  Objective: Vital Signs: There were no vitals taken for this visit.  Physical Exam:   Constitutional: Patient appears well-developed HEENT:  Head: Normocephalic Eyes:EOM are normal Neck: Normal range of motion Cardiovascular: Normal rate Pulmonary/chest: Effort normal Neurologic: Patient is alert Skin: Skin is warm Psychiatric: Patient has normal  mood and affect    Ortho Exam: Ortho exam demonstrates no knee effusion.  Range of motion is good.  Collateral and cruciate ligaments are stable.  No groin pain with internal extra rotation of the leg.  No nerve root tension signs.  No masses lymphadenopathy or skin changes noted in that left knee region.  No tenderness to palpation particularly in the back of the knee.  Specialty Comments:  No specialty comments available.  Imaging: No results found.   PMFS History: Patient Active Problem List   Diagnosis Date Noted  . B12 deficiency 03/23/2019  . Anemia 02/05/2019  . Rheumatoid arthritis with rheumatoid factor of multiple sites without organ or systems involvement ([facility]) 01/25/2019  . High risk medication use 01/25/2019  . Fatigue 09/18/2018  . Routine general medical examination at a health care facility 02/07/2015  . Morbid obesity ([facility]) 04/19/2013  . Hyperlipidemia associated with type 2 diabetes mellitus ([facility]) 07/28/2009  . Osteopenia 03/14/2009  . Diabetes mellitus type 2 with complications ([facility]) 0000000  . Hypothyroidism 09/24/2006  . Essential hypertension 09/24/2006   Past Medical History:  Diagnosis Date  . Arthritis   . Bronchitis    hx of  . CHF (congestive heart failure) ([facility])   . Diabetes mellitus   . GERD (gastroesophageal reflux disease)   . Hypercholesteremia   . Hypertension   . Hypothyroidism   . Pneumonia    hx of  .  PONV (postoperative nausea and vomiting)   . Thyroid disease     Family History  Problem Relation Age of Onset  . Diabetes Mother   . Heart disease Mother   . Hyperlipidemia Mother   . Diabetes Father   . Diabetes Sister   . Hypertension Sister   . Heart disease Sister   . Dementia Sister   . Heart disease Brother   . Healthy Daughter   . Healthy Daughter   . Healthy Daughter   . Healthy Son     Past Surgical History:  Procedure Laterality Date  . ABDOMINAL HYSTERECTOMY     partial  . BREAST SURGERY     lumpectomy  left breast  . carpel tunnel    . COLONOSCOPY W/ POLYPECTOMY    . FOOT SURGERY    . knee arhtroscopy    . SHOULDER ARTHROSCOPY WITH SUBACROMIAL DECOMPRESSION, ROTATOR CUFF REPAIR AND BICEP TENDON REPAIR Right 05/26/2012   Procedure: Right Shoulder Diagnostic Operative Arthroscopy, Subacromial Decompression, Biceps Tenodesis, Mini Open Rotator Cuff Repair;  Surgeon: [name], MD;  Location: [facility];  Service: Orthopedics;  Laterality: Right;  Right Shoulder Diagnostic Operative Arthroscopy, Subacromial Decompression, Biceps Tenodesis, Mini Open Rotator Cuff Repair  . TONSILLECTOMY     Social History   Occupational History  . Not on file  Tobacco Use  . Smoking status: Former Smoker    Quit date: 04/01/1990    Years since quitting: 29.1  . Smokeless tobacco: Never Used  Substance and Sexual Activity  . Alcohol use: No  . Drug use: No  . Sexual activity: Never    Birth control/protection: Abstinence

## 2019-05-24 ENCOUNTER — Telehealth: Payer: Self-pay | Admitting: Orthopedic Surgery

## 2019-05-24 NOTE — Telephone Encounter (Signed)
Pls advise. Thanks.  

## 2019-05-24 NOTE — Telephone Encounter (Signed)
Okay for hinged knee brace.  Please call thanks

## 2019-05-24 NOTE — Telephone Encounter (Signed)
Patient called.   She is requesting a brace or something to help manage walking with her leg.   Call back number: [phone]

## 2019-05-25 ENCOUNTER — Other Ambulatory Visit: Payer: Self-pay | Admitting: *Deleted

## 2019-05-25 ENCOUNTER — Telehealth: Payer: Self-pay

## 2019-05-25 ENCOUNTER — Other Ambulatory Visit: Payer: Self-pay | Admitting: Endocrinology

## 2019-05-25 ENCOUNTER — Other Ambulatory Visit: Payer: Self-pay

## 2019-05-25 ENCOUNTER — Telehealth: Payer: Self-pay | Admitting: Rheumatology

## 2019-05-25 DIAGNOSIS — Z79899 Other long term (current) drug therapy: Secondary | ICD-10-CM | POA: Diagnosis not present

## 2019-05-25 LAB — CBC WITH DIFFERENTIAL/PLATELET
Absolute Monocytes: 643 cells/uL (ref 200–950)
Basophils Absolute: 33 cells/uL (ref 0–200)
Basophils Relative: 0.3 %
Eosinophils Absolute: 153 cells/uL (ref 15–500)
Eosinophils Relative: 1.4 %
HCT: 33.1 % — ABNORMAL LOW (ref 35.0–45.0)
Hemoglobin: 10.6 g/dL — ABNORMAL LOW (ref 11.7–15.5)
Lymphs Abs: 3041 cells/uL (ref 850–3900)
MCH: 25.5 pg — ABNORMAL LOW (ref 27.0–33.0)
MCHC: 32 g/dL (ref 32.0–36.0)
MCV: 79.8 fL — ABNORMAL LOW (ref 80.0–100.0)
MPV: 10.3 fL (ref 7.5–12.5)
Monocytes Relative: 5.9 %
Neutro Abs: 7031 cells/uL (ref 1500–7800)
Neutrophils Relative %: 64.5 %
Platelets: 345 10*3/uL (ref 140–400)
RBC: 4.15 10*6/uL (ref 3.80–5.10)
RDW: 17.8 % — ABNORMAL HIGH (ref 11.0–15.0)
Total Lymphocyte: 27.9 %
WBC: 10.9 10*3/uL — ABNORMAL HIGH (ref 3.8–10.8)

## 2019-05-25 LAB — COMPLETE METABOLIC PANEL WITH GFR
AG Ratio: 1.5 (calc) (ref 1.0–2.5)
ALT: 14 U/L (ref 6–29)
AST: 16 U/L (ref 10–35)
Albumin: 3.9 g/dL (ref 3.6–5.1)
Alkaline phosphatase (APISO): 45 U/L (ref 37–153)
BUN/Creatinine Ratio: 13 (calc) (ref 6–22)
BUN: 12 mg/dL (ref 7–25)
CO2: 26 mmol/L (ref 20–32)
Calcium: 9.9 mg/dL (ref 8.6–10.4)
Chloride: 104 mmol/L (ref 98–110)
Creat: 0.94 mg/dL — ABNORMAL HIGH (ref 0.60–0.93)
GFR, Est African American: 71 mL/min/{1.73_m2} (ref >=60)
GFR, Est Non African American: 61 mL/min/{1.73_m2} (ref >=60)
Globulin: 2.6 g/dL (calc) (ref 1.9–3.7)
Glucose, Bld: 84 mg/dL (ref 65–99)
Potassium: 4 mmol/L (ref 3.5–5.3)
Sodium: 140 mmol/L (ref 135–146)
Total Bilirubin: 0.3 mg/dL (ref 0.2–1.2)
Total Protein: 6.5 g/dL (ref 6.1–8.1)

## 2019-05-25 MED ORDER — METHOTREXATE 2.5 MG PO TABS: 17.5000 mg | ORAL_TABLET | ORAL | 0 refills | Status: DC

## 2019-05-25 NOTE — Telephone Encounter (Signed)
Refill request received via fax  Last Visit: 04/07/19 Next Visit: 07/13/19 Labs: 05/04/19 Anemia is stable. WBC count is elevated but stable. Creatinine is borderline elevated. GFR is WNL.. Glucose was 122.  Okay to refill per Dr. Estanislado Pandy

## 2019-05-25 NOTE — Telephone Encounter (Signed)
Patient called back. I advised we are waiting on auth for gel injection. She would like you to know that she is in severe pain and feels that something should be done for this. She said she could hardly walk. Please advise. Thanks.

## 2019-05-25 NOTE — Telephone Encounter (Signed)
Tried calling patient to discuss. No answer. No VM to LM.

## 2019-05-25 NOTE — Telephone Encounter (Signed)
Patient advised she is due for labs in 3 months. Patient advised that depending upon her results it may require labs sooner.

## 2019-05-25 NOTE — Telephone Encounter (Signed)
Patient came in today for labwork.  Patient is requesting a return call to advise her when she is due for labwork next.

## 2019-05-25 NOTE — Telephone Encounter (Signed)
Approved for Synvisc One- Left knee Dr. Latanya Presser and Bill $94 Copay No OOP Prior auth required for 2nd insurance Auth # [id number] Dates : 05/24/19-05/23/20

## 2019-05-25 NOTE — Telephone Encounter (Signed)
I think she may have a stress reaction or stress fracture.  Her radiographs do not look that bad.  Needs MRI to rule out stress fracture potentially related to vitamin D deficiency.  Thank you

## 2019-05-25 NOTE — Telephone Encounter (Signed)
Called and scheduled appointment for 06/02/19 Pt informed for $94 copay and stated understanding and that " she pays her bills"

## 2019-05-26 NOTE — Progress Notes (Signed)
Labs are stable.

## 2019-05-27 ENCOUNTER — Other Ambulatory Visit: Payer: Self-pay

## 2019-05-27 ENCOUNTER — Other Ambulatory Visit: Payer: Self-pay | Admitting: Surgical

## 2019-05-27 DIAGNOSIS — M25562 Pain in left knee: Secondary | ICD-10-CM

## 2019-05-27 MED ORDER — TRAMADOL HCL 50 MG PO TABS: ORAL_TABLET | ORAL | 0 refills | Status: DC

## 2019-05-27 MED ORDER — DIAZEPAM 2 MG PO TABS: ORAL_TABLET | ORAL | 0 refills | Status: DC

## 2019-05-27 MED ORDER — METHOCARBAMOL 500 MG PO TABS: 500.0000 mg | ORAL_TABLET | Freq: Three times a day (TID) | ORAL | 0 refills | Status: DC | PRN

## 2019-05-27 NOTE — Telephone Encounter (Signed)
IC s/w patient. Advised about MRI. She said she will need something to help her relax for the scan. Can you please submit valium for her? Also she is asking about a refill for her pain medication and also a muscle relaxer to help with the spasms she is having in the back of her leg.

## 2019-05-27 NOTE — Telephone Encounter (Signed)
IC s/w patient and advised done.

## 2019-05-28 ENCOUNTER — Ambulatory Visit: Payer: Medicare Other | Attending: Internal Medicine

## 2019-05-28 DIAGNOSIS — Z23 Encounter for immunization: Secondary | ICD-10-CM | POA: Insufficient documentation

## 2019-05-28 NOTE — Progress Notes (Signed)
   Covid-19 Vaccination Clinic  Name:  Marissa Roberts    MRN: [id number] DOB: Nov 15, 1945  05/28/2019  [name] was observed post Covid-19 immunization for 30 minutes based on pre-vaccination screening without incidence. She was provided with Vaccine Information Sheet and instruction to access the V-Safe system.   [name] was instructed to call 911 with any severe reactions post vaccine: Marland Kitchen Difficulty breathing  . Swelling of your face and throat  . A fast heartbeat  . A bad rash all over your body  . Dizziness and weakness    Immunizations Administered    Name Date Dose VIS Date Route   Pfizer COVID-19 Vaccine 05/28/2019  2:28 PM 0.3 mL 03/12/2019 Intramuscular   Manufacturer: [place]   Lot: [id number]   [place]: [id number]

## 2019-05-31 ENCOUNTER — Ambulatory Visit (INDEPENDENT_AMBULATORY_CARE_PROVIDER_SITE_OTHER): Payer: BC Managed Care – PPO | Admitting: Podiatry

## 2019-05-31 ENCOUNTER — Encounter: Payer: Self-pay | Admitting: Podiatry

## 2019-05-31 ENCOUNTER — Other Ambulatory Visit: Payer: Self-pay

## 2019-05-31 VITALS — Temp 97.1°F

## 2019-05-31 DIAGNOSIS — M79674 Pain in right toe(s): Secondary | ICD-10-CM

## 2019-05-31 DIAGNOSIS — M79675 Pain in left toe(s): Secondary | ICD-10-CM | POA: Diagnosis not present

## 2019-05-31 DIAGNOSIS — Z794 Long term (current) use of insulin: Secondary | ICD-10-CM

## 2019-05-31 DIAGNOSIS — B351 Tinea unguium: Secondary | ICD-10-CM

## 2019-05-31 DIAGNOSIS — E119 Type 2 diabetes mellitus without complications: Secondary | ICD-10-CM

## 2019-05-31 DIAGNOSIS — L84 Corns and callosities: Secondary | ICD-10-CM | POA: Diagnosis not present

## 2019-05-31 NOTE — Progress Notes (Signed)
Subjective: Marissa Roberts presents today for follow up of preventative diabetic foot care and corn(s) b/l 5th toes and painful mycotic toenails b/l that are difficult to trim. Pain interferes with ambulation. Aggravating factors include wearing enclosed shoe gear. Pain is relieved with periodic professional debridement.   She relates her husband just got out of rehab facility from having 2nd hip procedure.   She is being worked up for her knee and has an MRI scheduled. She feels her knee is slipping at times.  Allergies  Allergen Reactions  . Contrast Media [Iodinated Diagnostic Agents]     Throat swells from ct con tra  . Adhesive [Tape]     No paper tape, causes blisters  . Erythromycin     REACTION: rash  . Penicillins     REACTION: rash     Objective: Vitals:   05/31/19 0859  Temp: (!) 97.1 F (36.2 C)    Vascular Examination:  Capillary fill time to digits <3s b/l, palpable DP pulses b/l, palpable PT pulses b/l, pedal hair sparse b/l and skin temperature gradient within normal limits b/l  Dermatological Examination: Pedal skin with normal turgor, texture and tone bilaterally, no open wounds bilaterally, no interdigital macerations bilaterally, toenails 1-5 b/l elongated, dystrophic, thickened, crumbly with subungual debris and hyperkeratotic lesion(s) dorsal 5th digits b/l.  No erythema, no edema, no drainage, no flocculence  Musculoskeletal: Normal muscle strength 5/5 to all lower extremity muscle groups bilaterally, no pain crepitus or joint limitation noted with ROM b/l and hammertoes noted to the  L 5th toe and R 5th toe  Neurological: Protective sensation intact 5/5 intact bilaterally with 10g monofilament b/l and vibratory sensation intact b/l  Assessment: 1. Pain due to onychomycosis of toenails of both feet   2. Corns   3. Type 2 diabetes mellitus without complication, with long-term current use of insulin ([place])    Plan: -Continue diabetic foot care  principles. Literature dispensed on today.  -Toenails 1-5 b/l were debrided in length and girth with sterile nail nippers and dremel without iatrogenic bleeding. -Corn(s) debrided b/l 5th digits without complication or incident. Total number debrided=2. -Patient to continue soft, supportive shoe gear daily. -Patient to report any pedal injuries to medical professional immediately. -Patient/POA to call should there be question/concern in the interim.  Return in about 3 months (around 08/31/2019) for diabetic nail and callus trim.

## 2019-05-31 NOTE — Patient Instructions (Signed)
Diabetes Mellitus and Foot Care Foot care is an important part of your health, especially when you have diabetes. Diabetes may cause you to have problems because of poor blood flow (circulation) to your feet and legs, which can cause your skin to:  Become thinner and drier.  Break more easily.  Heal more slowly.  Peel and crack. You may also have nerve damage (neuropathy) in your legs and feet, causing decreased feeling in them. This means that you may not notice minor injuries to your feet that could lead to more serious problems. Noticing and addressing any potential problems early is the best way to prevent future foot problems. How to care for your feet Foot hygiene  Wash your feet daily with warm water and mild soap. Do not use hot water. Then, pat your feet and the areas between your toes until they are completely dry. Do not soak your feet as this can dry your skin.  Trim your toenails straight across. Do not dig under them or around the cuticle. File the edges of your nails with an emery board or nail file.  Apply a moisturizing lotion or petroleum jelly to the skin on your feet and to dry, brittle toenails. Use lotion that does not contain alcohol and is unscented. Do not apply lotion between your toes. Shoes and socks  Wear clean socks or stockings every day. Make sure they are not too tight. Do not wear knee-high stockings since they may decrease blood flow to your legs.  Wear shoes that fit properly and have enough cushioning. Always look in your shoes before you put them on to be sure there are no objects inside.  To break in new shoes, wear them for just a few hours a day. This prevents injuries on your feet. Wounds, scrapes, corns, and calluses  Check your feet daily for blisters, cuts, bruises, sores, and redness. If you cannot see the bottom of your feet, use a mirror or ask someone for help.  Do not cut corns or calluses or try to remove them with medicine.  If you  find a minor scrape, cut, or break in the skin on your feet, keep it and the skin around it clean and dry. You may clean these areas with mild soap and water. Do not clean the area with peroxide, alcohol, or iodine.  If you have a wound, scrape, corn, or callus on your foot, look at it several times a day to make sure it is healing and not infected. Check for: ? Redness, swelling, or pain. ? Fluid or blood. ? Warmth. ? Pus or a bad smell. General instructions  Do not cross your legs. This may decrease blood flow to your feet.  Do not use heating pads or hot water bottles on your feet. They may burn your skin. If you have lost feeling in your feet or legs, you may not know this is happening until it is too late.  Protect your feet from hot and cold by wearing shoes, such as at the beach or on hot pavement.  Schedule a complete foot exam at least once a year (annually) or more often if you have foot problems. If you have foot problems, report any cuts, sores, or bruises to your health care provider immediately. Contact a health care provider if:  You have a medical condition that increases your risk of infection and you have any cuts, sores, or bruises on your feet.  You have an injury that is not   healing.  You have redness on your legs or feet.  You feel burning or tingling in your legs or feet.  You have pain or cramps in your legs and feet.  Your legs or feet are numb.  Your feet always feel cold.  You have pain around a toenail. Get help right away if:  You have a wound, scrape, corn, or callus on your foot and: ? You have pain, swelling, or redness that gets worse. ? You have fluid or blood coming from the wound, scrape, corn, or callus. ? Your wound, scrape, corn, or callus feels warm to the touch. ? You have pus or a bad smell coming from the wound, scrape, corn, or callus. ? You have a fever. ? You have a red line going up your leg. Summary  Check your feet every day  for cuts, sores, red spots, swelling, and blisters.  Moisturize feet and legs daily.  Wear shoes that fit properly and have enough cushioning.  If you have foot problems, report any cuts, sores, or bruises to your health care provider immediately.  Schedule a complete foot exam at least once a year (annually) or more often if you have foot problems. This information is not intended to replace advice given to you by your health care provider. Make sure you discuss any questions you have with your health care provider. Document Revised: 12/09/2018 Document Reviewed: 04/19/2016 Elsevier Patient Education  2020 Elsevier Inc.  

## 2019-06-02 ENCOUNTER — Ambulatory Visit: Payer: Medicare Other | Admitting: Orthopedic Surgery

## 2019-06-07 ENCOUNTER — Other Ambulatory Visit: Payer: Self-pay | Admitting: Endocrinology

## 2019-06-08 ENCOUNTER — Ambulatory Visit
Admission: RE | Admit: 2019-06-08 | Discharge: 2019-06-08 | Disposition: A | Discharge status: Home / Self Care | Payer: BC Managed Care – PPO | Source: Ambulatory Visit | Attending: Orthopedic Surgery | Admitting: Orthopedic Surgery

## 2019-06-08 DIAGNOSIS — M25562 Pain in left knee: Secondary | ICD-10-CM

## 2019-06-08 DIAGNOSIS — S83242A Other tear of medial meniscus, current injury, left knee, initial encounter: Secondary | ICD-10-CM | POA: Diagnosis not present

## 2019-06-14 DIAGNOSIS — H40023 Open angle with borderline findings, high risk, bilateral: Secondary | ICD-10-CM | POA: Diagnosis not present

## 2019-06-15 ENCOUNTER — Other Ambulatory Visit: Payer: Self-pay

## 2019-06-15 ENCOUNTER — Other Ambulatory Visit (INDEPENDENT_AMBULATORY_CARE_PROVIDER_SITE_OTHER): Payer: BC Managed Care – PPO

## 2019-06-15 DIAGNOSIS — Z794 Long term (current) use of insulin: Secondary | ICD-10-CM

## 2019-06-15 DIAGNOSIS — D649 Anemia, unspecified: Secondary | ICD-10-CM | POA: Diagnosis not present

## 2019-06-15 DIAGNOSIS — E1165 Type 2 diabetes mellitus with hyperglycemia: Secondary | ICD-10-CM

## 2019-06-15 LAB — BASIC METABOLIC PANEL
BUN: 11 mg/dL (ref 6–23)
CO2: 31 mEq/L (ref 19–32)
Calcium: 9.9 mg/dL (ref 8.4–10.5)
Chloride: 98 mEq/L (ref 96–112)
Creatinine, Ser: 0.83 mg/dL (ref 0.40–1.20)
GFR: 81.81 mL/min (ref >60.00)
Glucose, Bld: 78 mg/dL (ref 70–99)
Potassium: 4.4 mEq/L (ref 3.5–5.1)
Sodium: 137 mEq/L (ref 135–145)

## 2019-06-15 LAB — CBC
HCT: 33.8 % — ABNORMAL LOW (ref 36.0–46.0)
Hemoglobin: 10.7 g/dL — ABNORMAL LOW (ref 12.0–15.0)
MCHC: 31.8 g/dL (ref 30.0–36.0)
MCV: 79.9 fl (ref 78.0–100.0)
Platelets: 387 10*3/uL (ref 150.0–400.0)
RBC: 4.22 Mil/uL (ref 3.87–5.11)
RDW: 19.5 % — ABNORMAL HIGH (ref 11.5–15.5)
WBC: 11 10*3/uL — ABNORMAL HIGH (ref 4.0–10.5)

## 2019-06-16 ENCOUNTER — Ambulatory Visit (INDEPENDENT_AMBULATORY_CARE_PROVIDER_SITE_OTHER): Payer: Medicare Other | Admitting: Orthopedic Surgery

## 2019-06-16 ENCOUNTER — Encounter: Payer: Self-pay | Admitting: Orthopedic Surgery

## 2019-06-16 DIAGNOSIS — M1712 Unilateral primary osteoarthritis, left knee: Secondary | ICD-10-CM

## 2019-06-17 ENCOUNTER — Other Ambulatory Visit: Payer: Self-pay

## 2019-06-18 ENCOUNTER — Encounter: Payer: Self-pay | Admitting: Orthopedic Surgery

## 2019-06-18 NOTE — Progress Notes (Signed)
Office Visit Note   Patient: Marissa Roberts           Date of Birth: 06/05/1945           MRN: [id number] Visit Date: 06/16/2019 Requested by: [name], MD [address] [place],  [place] [address] PCP: [name], MD  Subjective: Chief Complaint  Patient presents with  . Follow-up    HPI: Marissa Roberts is a 74 year old patient with significant left knee pain.  She does have a history of diabetes and hypertension.  Husband is at home.  No cardiac history.  No personal or family history of DVT or pulmonary embolism.  She has 3 stairs at her house.  MRI scan does show meniscal root avulsion on that medial side with subchondral stress fracture involving the medial femoral condyle and to a lesser degree the medial tibial plateau.  Tricompartmental degenerative changes are present.  The patient describes continued pain and symptoms which are refractory to nonoperative management.             ROS: All systems reviewed are negative as they relate to the chief complaint within the history of present illness.  Patient denies  fevers or chills.   Assessment & Plan: Visit Diagnoses: No diagnosis found.  Plan: Impression is left knee pain with meniscal root evulsion and degenerative changes.  Failure of nonoperative management.  Essentially Marissa Roberts can live with this or consider knee replacement.  I do not think arthroscopic intervention will give her in any way shape or form predictable pain relief.  Patient understands the risk and benefits of knee replacement including but not limited to infection nerve vessel damage knee stiffness incomplete pain relief as well as potential for revision.  Patient understands the extensive and difficult nature of the rehabilitative process involved in knee replacement.  All questions answered.  She does want to proceed with that.  She had a cortisone injection February 5.  We will try to push this out until April or May.  Follow-Up  Instructions: No follow-ups on file.   Orders:  No orders of the defined types were placed in this encounter.  No orders of the defined types were placed in this encounter.     Procedures: No procedures performed   Clinical Data: No additional findings.  Objective: Vital Signs: There were no vitals taken for this visit.  Physical Exam:   Constitutional: Patient appears well-developed HEENT:  Head: Normocephalic Eyes:EOM are normal Neck: Normal range of motion Cardiovascular: Normal rate Pulmonary/chest: Effort normal Neurologic: Patient is alert Skin: Skin is warm Psychiatric: Patient has normal mood and affect    Ortho Exam: Ortho exam demonstrates antalgic gait to the left.  No groin pain on the left with internal extra rotation of the leg.  Pedal pulse palpable.  Does have medial and lateral joint line tenderness.  She has about a 5 degree flexion contracture and can bend to about 95 degrees.  Specialty Comments:  No specialty comments available.  Imaging: No results found.   PMFS History: Patient Active Problem List   Diagnosis Date Noted  . B12 deficiency 03/23/2019  . Anemia 02/05/2019  . Rheumatoid arthritis with rheumatoid factor of multiple sites without organ or systems involvement ([place]) 01/25/2019  . High risk medication use 01/25/2019  . Fatigue 09/18/2018  . Routine general medical examination at a health care facility 02/07/2015  . Morbid obesity ([place]) 04/19/2013  . Hyperlipidemia associated with type 2 diabetes mellitus ([place]) 07/28/2009  .  Osteopenia 03/14/2009  . Diabetes mellitus type 2 with complications ([place]) 0000000  . Hypothyroidism 09/24/2006  . Essential hypertension 09/24/2006   Past Medical History:  Diagnosis Date  . Arthritis   . Bronchitis    hx of  . CHF (congestive heart failure) ([place])   . Diabetes mellitus   . GERD (gastroesophageal reflux disease)   . Hypercholesteremia   . Hypertension   . Hypothyroidism   .  Pneumonia    hx of  . PONV (postoperative nausea and vomiting)   . Thyroid disease     Family History  Problem Relation Age of Onset  . Diabetes Mother   . Heart disease Mother   . Hyperlipidemia Mother   . Diabetes Father   . Diabetes Sister   . Hypertension Sister   . Heart disease Sister   . Dementia Sister   . Heart disease Brother   . Healthy Daughter   . Healthy Daughter   . Healthy Daughter   . Healthy Son     Past Surgical History:  Procedure Laterality Date  . ABDOMINAL HYSTERECTOMY     partial  . BREAST SURGERY     lumpectomy left breast  . carpel tunnel    . COLONOSCOPY W/ POLYPECTOMY    . FOOT SURGERY    . knee arhtroscopy    . SHOULDER ARTHROSCOPY WITH SUBACROMIAL DECOMPRESSION, ROTATOR CUFF REPAIR AND BICEP TENDON REPAIR Right 05/26/2012   Procedure: Right Shoulder Diagnostic Operative Arthroscopy, Subacromial Decompression, Biceps Tenodesis, Mini Open Rotator Cuff Repair;  Surgeon: Marissa Pel, MD;  Location: [facility];  Service: Orthopedics;  Laterality: Right;  Right Shoulder Diagnostic Operative Arthroscopy, Subacromial Decompression, Biceps Tenodesis, Mini Open Rotator Cuff Repair  . TONSILLECTOMY     Social History   Occupational History  . Not on file  Tobacco Use  . Smoking status: Former Smoker    Quit date: 04/01/1990    Years since quitting: 29.2  . Smokeless tobacco: Never Used  Substance and Sexual Activity  . Alcohol use: No  . Drug use: No  . Sexual activity: Never    Birth control/protection: Abstinence

## 2019-06-21 ENCOUNTER — Telehealth: Payer: Self-pay | Admitting: Endocrinology

## 2019-06-21 NOTE — Telephone Encounter (Signed)
She is supposed to switch from diltiazem back to amlodipine 5 mg daily If she has been taking [place] she only needs to take 1 tablet of Lasix

## 2019-06-21 NOTE — Progress Notes (Signed)
[place], [place] [address] [place] [address] Phone: [phone] Fax: [phone]      Your procedure is scheduled on Thursday, July 01, 2019.  Report to [facility] Main Entrance "A" at 10:15 A.M., and check in at the Admitting office.  Call this number if you have problems the morning of surgery:  [phone]  Call [phone] if you have any questions prior to your surgery date Monday-Friday 8am-4pm    Remember:  Do not eat after midnight the night before your surgery  You may drink clear liquids until 9:15 AM the morning of your surgery.   Clear liquids allowed are: Water, Non-Citrus Juices (without pulp), Carbonated Beverages, Clear Tea, Black Coffee Only, and Gatorade  Please complete your PRE-SURGERY ENSURE (water) that was provided to you by 9:15 AM the morning of surgery.  Please, if able, drink it in one setting. DO NOT SIP.     Take these medicines the morning of surgery with A SIP OF WATER:  amLODipine (NORVASC)  hydroxychloroquine (PLAQUENIL) levothyroxine (SYNTHROID)  acetaminophen (TYLENOL) - if needed fluticasone (FLONASE) - if needed methocarbamol (ROBAXIN) - if needed rosuvastatin (CRESTOR) - if needed traMADol (ULTRAM) - if needed     As of today, stop taking all Aspirin (unless instructed by your doctor) and other Aspirin containing products, Vitamins, Fish Oils, and Herbal Medications. Also stop all NSAIDS i.e. Advil, Ibuprofen, Motrin, Aleve, Anaprox, Naproxen, BC, Goody Powders, and all Supplements.   WHAT DO I DO ABOUT MY DIABETES MEDICATION?   Marland Kitchen Do not take oral diabetes medicines (metFORMIN (GLUCOPHAGE)) the morning of surgery. . Do not take dapagliflozin propanediol (FARXIGA) the day before surgery or the morning of surgery. . Do not take NOVOLOG FLEXPEN the night before surgery or the morning of surgery.  . THE NIGHT BEFORE SURGERY, take ____25___ units of ___TRESIBA [place]  __insulin.    . THE MORNING OF SURGERY, take _____25____ units of __TRESIBA [place]  ____insulin.   . If your CBG is greater than 220 mg/dL, you may take  of your sliding scale (NOVOLOG FLEXPEN ) dose of insulin.  HOW TO MANAGE YOUR DIABETES BEFORE AND AFTER SURGERY  Why is it important to control my blood sugar before and after surgery? . Improving blood sugar levels before and after surgery helps healing and can limit problems. . A way of improving blood sugar control is eating a healthy diet by: o  Eating less sugar and carbohydrates o  Increasing activity/exercise o  Talking with your doctor about reaching your blood sugar goals . High blood sugars (greater than 180 mg/dL) can raise your risk of infections and slow your recovery, so you will need to focus on controlling your diabetes during the weeks before surgery. . Make sure that the doctor who takes care of your diabetes knows about your planned surgery including the date and location.  How do I manage my blood sugar before surgery? . Check your blood sugar at least 4 times a day, starting 2 days before surgery, to make sure that the level is not too high or low. . Check your blood sugar the morning of your surgery when you wake up and every 2 hours until you get to the Short Stay unit. o If your blood sugar is less than 70 mg/dL, you will need to treat for low blood sugar: - Do not take insulin. - Treat a low blood sugar (less than 70 mg/dL) with  cup of clear juice (cranberry or apple), 4 glucose tablets, OR glucose gel. - Recheck blood sugar in 15 minutes after treatment (to make sure it is greater than 70 mg/dL). If your blood sugar is not greater than 70 mg/dL on recheck, call [phone] for further instructions. . Report your blood sugar to the short stay nurse when you get to Short Stay.  . If you are admitted to the hospital after surgery: o Your blood sugar will be checked by the staff and you will probably be  given insulin after surgery (instead of oral diabetes medicines) to make sure you have good blood sugar levels. o The goal for blood sugar control after surgery is 80-180 mg/dL.    No Smoking of any kind, Tobacco, or Alcohol products 24 hours prior to your procedure. If you use a CPAP at night, you may bring all equipment for your overnight stay.                        Do not wear jewelry, make up, or nail polish            Do not wear lotions, powders, perfumes, or deodorant.            Do not shave 48 hours prior to surgery.            Do not bring valuables to the hospital.            [facility] is not responsible for any belongings or valuables.   Contacts, glasses, dentures or bridgework may not be worn into surgery.      For patients admitted to the hospital, discharge time will be determined by your treatment team.   Patients discharged the day of surgery will not be allowed to drive home, and someone needs to stay with them for 24 hours.    Special instructions:   [facility]- Preparing For Surgery  Before surgery, you can play an important role. Because skin is not sterile, your skin needs to be as free of germs as possible. You can reduce the number of germs on your skin by washing with CHG (chlorahexidine gluconate) Soap before surgery.  CHG is an antiseptic cleaner which kills germs and bonds with the skin to continue killing germs even after washing.    Oral Hygiene is also important to reduce your risk of infection.  Remember - BRUSH YOUR TEETH THE MORNING OF SURGERY WITH YOUR REGULAR TOOTHPASTE  Please do not use if you have an allergy to CHG or antibacterial soaps. If your skin becomes reddened/irritated stop using the CHG.  Do not shave (including legs and underarms) for at least 48 hours prior to first CHG shower. It is OK to shave your face.  Please follow these instructions carefully.   1. Shower the NIGHT BEFORE SURGERY and the MORNING OF SURGERY with CHG Soap.    2. If you chose to wash your hair, wash your hair first as usual with your normal shampoo.  3. After you shampoo, rinse your hair and body thoroughly to remove the shampoo.  4. Use CHG as you would any other liquid soap. You can apply CHG directly to the skin and wash gently with a scrungie or a clean washcloth.   5. Apply the CHG Soap to your body ONLY FROM THE NECK DOWN.  Do not use on open wounds or open sores. Avoid contact with your eyes, ears, mouth and genitals (private parts). Wash  Face and genitals (private parts)  with your normal soap.   6. Wash thoroughly, paying special attention to the area where your surgery will be performed.  7. Thoroughly rinse your body with warm water from the neck down.  8. DO NOT shower/wash with your normal soap after using and rinsing off the CHG Soap.  9. Pat yourself dry with a CLEAN TOWEL.  10. Wear CLEAN PAJAMAS to bed the night before surgery, wear comfortable clothes the morning of surgery  11. Place CLEAN SHEETS on your bed the night of your first shower and DO NOT SLEEP WITH PETS.   Day of Surgery:   Do not apply any deodorants/lotions.  Please wear clean clothes to the hospital/surgery center.   Remember to brush your teeth WITH YOUR REGULAR TOOTHPASTE.   Please read over the following fact sheets that you were given.

## 2019-06-21 NOTE — Telephone Encounter (Signed)
Patient called asking if she still needs to be taking diltiazem [facility]). Also she was asking if she needs to change taking furosemide (LASIX) from 2x daily to once daily? She thought you all had discussed changing that at her last visit. Ph# [phone]

## 2019-06-22 ENCOUNTER — Encounter (HOSPITAL_COMMUNITY)
Admission: RE | Admit: 2019-06-22 | Discharge: 2019-06-22 | Disposition: A | Discharge status: Home / Self Care | Payer: Medicare Other | Source: Ambulatory Visit | Attending: Orthopedic Surgery | Admitting: Orthopedic Surgery

## 2019-06-22 ENCOUNTER — Telehealth: Payer: Self-pay | Admitting: Orthopedic Surgery

## 2019-06-22 ENCOUNTER — Other Ambulatory Visit: Payer: Self-pay

## 2019-06-22 ENCOUNTER — Encounter (HOSPITAL_COMMUNITY): Payer: Self-pay

## 2019-06-22 DIAGNOSIS — E119 Type 2 diabetes mellitus without complications: Secondary | ICD-10-CM | POA: Diagnosis not present

## 2019-06-22 DIAGNOSIS — I440 Atrioventricular block, first degree: Secondary | ICD-10-CM | POA: Diagnosis not present

## 2019-06-22 DIAGNOSIS — I10 Essential (primary) hypertension: Secondary | ICD-10-CM | POA: Insufficient documentation

## 2019-06-22 DIAGNOSIS — Z01818 Encounter for other preprocedural examination: Secondary | ICD-10-CM | POA: Insufficient documentation

## 2019-06-22 HISTORY — DX: Family history of other specified conditions: Z84.89

## 2019-06-22 LAB — BASIC METABOLIC PANEL
Anion gap: 11 (ref 5–15)
BUN: 12 mg/dL (ref 8–23)
CO2: 25 mmol/L (ref 22–32)
Calcium: 9.7 mg/dL (ref 8.9–10.3)
Chloride: 102 mmol/L (ref 98–111)
Creatinine, Ser: 0.77 mg/dL (ref 0.44–1.00)
GFR calc Af Amer: >60 mL/min (ref >60)
GFR calc non Af Amer: >60 mL/min (ref >60)
Glucose, Bld: 95 mg/dL (ref 70–99)
Potassium: 3.8 mmol/L (ref 3.5–5.1)
Sodium: 138 mmol/L (ref 135–145)

## 2019-06-22 LAB — HEMOGLOBIN A1C
Hgb A1c MFr Bld: 6.3 % — ABNORMAL HIGH (ref 4.8–5.6)
Mean Plasma Glucose: 134.11 mg/dL

## 2019-06-22 LAB — URINALYSIS, ROUTINE W REFLEX MICROSCOPIC
Bilirubin Urine: NEGATIVE
Glucose, UA: >=500 mg/dL — AB
Hgb urine dipstick: NEGATIVE
Ketones, ur: NEGATIVE mg/dL
Leukocytes,Ua: NEGATIVE
Nitrite: NEGATIVE
Protein, ur: NEGATIVE mg/dL
Specific Gravity, Urine: 1.007 (ref 1.005–1.030)
pH: 5 (ref 5.0–8.0)

## 2019-06-22 LAB — SURGICAL PCR SCREEN
MRSA, PCR: NEGATIVE
Staphylococcus aureus: NEGATIVE

## 2019-06-22 LAB — CBC
HCT: 35.7 % — ABNORMAL LOW (ref 36.0–46.0)
Hemoglobin: 11 g/dL — ABNORMAL LOW (ref 12.0–15.0)
MCH: 25.4 pg — ABNORMAL LOW (ref 26.0–34.0)
MCHC: 30.8 g/dL (ref 30.0–36.0)
MCV: 82.4 fL (ref 80.0–100.0)
Platelets: 378 10*3/uL (ref 150–400)
RBC: 4.33 MIL/uL (ref 3.87–5.11)
RDW: 17.5 % — ABNORMAL HIGH (ref 11.5–15.5)
WBC: 12.3 10*3/uL — ABNORMAL HIGH (ref 4.0–10.5)
nRBC: 0 % (ref 0.0–0.2)

## 2019-06-22 LAB — GLUCOSE, CAPILLARY: Glucose-Capillary: 101 mg/dL — ABNORMAL HIGH (ref 70–99)

## 2019-06-22 NOTE — Telephone Encounter (Signed)
Called pt and gave her MD message. Pt verbalized understanding. She stated that she has been taking the medication as MD instructed, which is Amlodipine 5mg  tabs 1 tab PO QD, Farxiga once daily, and 1 tab of Lasix once daily.

## 2019-06-22 NOTE — Telephone Encounter (Signed)
Patient called asked for a call back letting her know how long she will be in the hospital and will she be going to a rehab facility? Patient asked if her daughter and husband can go back with her when she go back for surgery. The number to contact patient is [phone] or [phone]

## 2019-06-22 NOTE — Progress Notes (Addendum)
PCP - Dr. Pricilla Holm Cardiologist - denies Endocrinologist - Dr. Elayne Snare  PPM/ICD - denies  Chest x-ray - N/A EKG - 06/22/2019 Stress Test - per patient "probably bout 10 years ago for work, cannot remember" ECHO - denies Cardiac Cath - denies  Sleep Study - denies CPAP - N/A  Fasting Blood Sugar - 98 -120s Checks Blood Sugar 2 times a day  Blood Thinner Instructions: N/A Aspirin Instructions: N/A  ERAS Protcol - Yes PRE-SURGERY Ensure or G2- (1) 10 oz bottle of water given  COVID TEST- Scheduled for 06/28/2019. Patient verbalized understanding of self-quarantine instructions, appointment time and place.   Anesthesia review: No  Patient denies shortness of breath, fever, cough and chest pain at PAT appointment  All instructions explained to the patient, with a verbal understanding of the material. Patient agrees to go over the instructions while at home for a better understanding. Patient also instructed to self quarantine after being tested for COVID-19. The opportunity to ask questions was provided.

## 2019-06-22 NOTE — Telephone Encounter (Signed)
Pls advise. Thanks.  

## 2019-06-23 ENCOUNTER — Ambulatory Visit: Payer: Medicare Other | Attending: Internal Medicine

## 2019-06-23 DIAGNOSIS — Z23 Encounter for immunization: Secondary | ICD-10-CM

## 2019-06-23 LAB — URINE CULTURE

## 2019-06-23 NOTE — Telephone Encounter (Signed)
I called and answered her questions.

## 2019-06-23 NOTE — Progress Notes (Signed)
   Covid-19 Vaccination Clinic  Name:  Marissa Roberts    MRN: [id number] DOB: 18-Feb-1946  06/23/2019  [name] was observed post Covid-19 immunization for 30 minutes based on pre-vaccination screening without incident. She was provided with Vaccine Information Sheet and instruction to access the V-Safe system.   [name] was instructed to call 911 with any severe reactions post vaccine: Marland Kitchen Difficulty breathing  . Swelling of face and throat  . A fast heartbeat  . A bad rash all over body  . Dizziness and weakness   Immunizations Administered    Name Date Dose VIS Date Route   Pfizer COVID-19 Vaccine 06/23/2019  2:38 PM 0.3 mL 03/12/2019 Intramuscular   Manufacturer: [place]   Lot: [id number]   [place]: [id number]

## 2019-06-24 NOTE — Progress Notes (Signed)
Patient ID: Marissa Roberts, female   DOB: 04-Oct-1945, 74 y.o.   MRN: [id number]               Reason for Appointment:  Follow-up for diabetes and hypertension   History of Present Illness:             Type 2 diabetes mellitus, date of diagnosis: 2005       INSULIN regimen is  Tresiba 50 units am, Novolog  10-15 units bid- tid ac  Oral hypoglycemic drugs the patient is taking are: Metformin 2 g daily, Farxiga 5 mg daily   Current blood sugar patterns, management and problems identified:  Her A1c is continuing to improve and now 6.3, was 6.9    Since she had difficulty with weight gain and likely some postprandial hyperglycemia she was started on Farxiga on her last visit a couple of months ago  She was also told to reduce her Tresiba to 46 units but she did not do so  Despite readings as low as 56 in the morning she does not appear to have any symptoms of hypoglycemia  Has not done any readings after meals as directed  However she has reduced her NovoLog by 5 units overall  She has not had any side effects from [place] and has lost significant amount of weight  Nonfasting glucose was 95 in the lab  Currently waiting for knee surgery and not doing any physical activity  Her diet is somewhat better and she is not eating as many high-fat meals     Side effects from medications have been:none  Compliance with the medical regimen: Fair  Glucose monitoring:  done 1 time a day         Glucometer:  One Touch.      Blood Glucose readings from review of monitor download:   PRE-MEAL Fasting Lunch Dinner Bedtime Overall  Glucose range: 56-96      Mean/median:  79     84   POST-MEAL PC Breakfast PC Lunch PC Dinner  Glucose range:   127   Mean/median:      Previous readings:   PRE-MEAL Fasting Lunch Dinner Bedtime Overall  Glucose range:  105-147    90, 127   Mean/median:  123   119   127   POST-MEAL PC Breakfast PC Lunch PC Dinner  Glucose range:       Mean/median:    115      Self-care:   Meals: 3 meals per day. Breakfast is usually grits with or without eggs or meat Not always restricting fat intake or sweet tea  when eating out        Exercise:  Minimal  Dietician visit, most recent:2014.               Weight history:  Wt Readings from Last 3 Encounters:  06/25/19 238 lb (108 kg)  06/22/19 239 lb (108.4 kg)  05/04/19 252 lb (114.3 kg)    Glycemic control:   Lab Results  Component Value Date   HGBA1C 6.3 (H) 06/22/2019   HGBA1C 6.9 (H) 04/27/2019   HGBA1C 7.2 (H) 01/22/2019   Lab Results  Component Value Date   MICROALBUR 41.7 (H) 01/22/2019   LDLCALC 77 04/17/2018   CREATININE 0.77 06/22/2019   No results found for: FRUCTOSAMINE    Past history:  She thinks her blood sugars were only mildly increased at borderline levels at the onsetper Most likely she was treated with metformin initially  and this has been continued.   At some point she was also tried on [place] and [place] for improving her control. She is not sure why she was started on insulin 5 years ago, presumably for worsening hyperglycemia Also not clear if she has tried various insulin regimens before starting Lantus and NovoLog  Previously had tried the V-go pump but subsequently she stopped doing the V-go pump because of the cost, skin irritation and local discomfort   Allergies as of 06/25/2019      Reactions   Contrast Media [iodinated Diagnostic Agents]    Throat swells from ct con tra   Adhesive [tape]    No paper tape, causes blisters   Erythromycin Rash   Penicillins Rash   Did it involve swelling of the face/tongue/throat, SOB, or low BP? No Did it involve sudden or severe rash/hives, skin peeling, or any reaction on the inside of your mouth or nose? No Did you need to seek medical attention at a hospital or doctor's office? No When did it last happen?Many years If all above answers are "NO", may proceed with cephalosporin  use.      Medication List       Accurate as of June 25, 2019  9:52 AM. If you have any questions, ask your nurse or doctor.        STOP taking these medications   diazepam 2 MG tablet Commonly known as: Valium Stopped by: Elayne Snare, MD     TAKE these medications   acetaminophen 500 MG tablet Commonly known as: TYLENOL Take 500-1,000 mg by mouth every 6 (six) hours as needed for mild pain or headache.   amLODipine 5 MG tablet Commonly known as: NORVASC Take 1 tablet (5 mg total) by mouth daily.   BD Pen Needle Nano U/F 32G X 4 MM Misc Generic drug: Insulin Pen Needle USE  4 TIMES DAILY TO  INJECT  INSULIN   Biotin 10000 MCG Tabs Take 10,000 mcg by mouth daily.   calcium-vitamin D 500-200 MG-UNIT tablet Commonly known as: OSCAL WITH D Take 2 tablets by mouth daily with breakfast.   diltiazem 360 MG 24 hr capsule Commonly known as: TIAZAC Take 1 capsule (360 mg total) by mouth daily.   Farxiga 5 MG Tabs tablet Generic drug: dapagliflozin propanediol Take 5 mg by mouth daily.   fluticasone 50 MCG/ACT nasal spray Commonly known as: FLONASE SPRAY 2 SPRAYS INTO EACH NOSTRIL EVERY DAY What changed: See the new instructions.   folic acid 1 MG tablet Commonly known as: FOLVITE Take 2 tablets (2 mg total) by mouth daily.   furosemide 40 MG tablet Commonly known as: LASIX Take 2 tablets by mouth once daily What changed: how much to take   GLUCOSAMINE PO Take 1 capsule by mouth daily.   hydroxychloroquine 200 MG tablet Commonly known as: PLAQUENIL Take 1 tablet by mouth twice daily   levothyroxine 137 MCG tablet Commonly known as: SYNTHROID Take 1 tablet by mouth once daily What changed:   how much to take  how to take this  when to take this  additional instructions   Linzess 290 MCG Caps capsule Generic drug: linaclotide Take 1 capsule (290 mcg total) by mouth daily. What changed:   when to take this  reasons to take this   metFORMIN 1000  MG tablet Commonly known as: GLUCOPHAGE TAKE 1 TABLET BY MOUTH TWICE DAILY WITH A MEAL What changed: See the new instructions.   methocarbamol 500 MG tablet Commonly  known as: Robaxin Take 1 tablet (500 mg total) by mouth every 8 (eight) hours as needed. What changed: reasons to take this   methotrexate 2.5 MG tablet Commonly known as: RHEUMATREX Take 7 tablets (17.5 mg total) by mouth once a week. PROTECT FROM LIGHT   montelukast 10 MG tablet Commonly known as: SINGULAIR Take 1 tablet (10 mg total) by mouth at bedtime.   multivitamin with minerals Tabs tablet Take 1 tablet by mouth daily.   NovoLOG FlexPen 100 UNIT/ML FlexPen Generic drug: insulin aspart Inject 15-22 units under the skin three times daily before meals. What changed:   how much to take  how to take this  when to take this  additional instructions   OneTouch Delica Lancets [id number] Misc USE TO CHECK BLOOD SUGAR UP TO THREE TIMES A DAY 2 HOURS AFTER A MEAL AND UP TO 3 TIMES A WEEK UPON WAKING UP   OneTouch Verio test strip Generic drug: glucose blood Use Onetouch verio test strips as instructed to check blood sugars 4 times daily. DX:E11.65   potassium chloride 10 MEQ tablet Commonly known as: KLOR-CON Take 1 tablet (10 mEq total) by mouth 2 (two) times daily. What changed: when to take this   rosuvastatin 40 MG tablet Commonly known as: CRESTOR Take 1 tablet by mouth once daily   traMADol 50 MG tablet Commonly known as: ULTRAM 1 po bid prn pain What changed:   how much to take  how to take this  when to take this  reasons to take this   [place] FlexTouch 200 UNIT/ML FlexTouch Pen Generic drug: insulin degludec Inject 50 Units into the skin daily. What changed: Another medication with the same name was removed. Continue taking this medication, and follow the directions you see here. Changed by: Elayne Snare, MD   valsartan-hydrochlorothiazide 320-12.5 MG tablet Commonly known as:  DIOVAN-HCT Take 1 tablet by mouth daily.       Allergies:  Allergies  Allergen Reactions  . Contrast Media [Iodinated Diagnostic Agents]     Throat swells from ct con tra  . Adhesive [Tape]     No paper tape, causes blisters  . Erythromycin Rash  . Penicillins Rash    Did it involve swelling of the face/tongue/throat, SOB, or low BP? No Did it involve sudden or severe rash/hives, skin peeling, or any reaction on the inside of your mouth or nose? No Did you need to seek medical attention at a hospital or doctor's office? No When did it last happen?Many years If all above answers are "NO", may proceed with cephalosporin use.     Past Medical History:  Diagnosis Date  . Arthritis   . Bronchitis    hx of  . CHF (congestive heart failure) ([place])   . Diabetes mellitus   . Family history of adverse reaction to anesthesia    per patient, "daughter threw up after a knee surgery"  . GERD (gastroesophageal reflux disease)   . Hypercholesteremia   . Hypertension   . Hypothyroidism   . Pneumonia    hx of  . PONV (postoperative nausea and vomiting)   . Thyroid disease     Past Surgical History:  Procedure Laterality Date  . ABDOMINAL HYSTERECTOMY     partial  . BREAST SURGERY     lumpectomy left breast  . carpel tunnel    . COLONOSCOPY W/ POLYPECTOMY    . FOOT SURGERY    . knee arhtroscopy    . SHOULDER ARTHROSCOPY WITH  SUBACROMIAL DECOMPRESSION, ROTATOR CUFF REPAIR AND BICEP TENDON REPAIR Right 05/26/2012   Procedure: Right Shoulder Diagnostic Operative Arthroscopy, Subacromial Decompression, Biceps Tenodesis, Mini Open Rotator Cuff Repair;  Surgeon: [name], MD;  Location: [facility];  Service: Orthopedics;  Laterality: Right;  Right Shoulder Diagnostic Operative Arthroscopy, Subacromial Decompression, Biceps Tenodesis, Mini Open Rotator Cuff Repair  . TONSILLECTOMY      Family History  Problem Relation Age of Onset  . Diabetes Mother   . Heart disease Mother    . Hyperlipidemia Mother   . Diabetes Father   . Diabetes Sister   . Hypertension Sister   . Heart disease Sister   . Dementia Sister   . Heart disease Brother   . Healthy Daughter   . Healthy Daughter   . Healthy Daughter   . Healthy Son     Social History:  reports that she quit smoking about 29 years ago. She has never used smokeless tobacco. She reports previous alcohol use. She reports that she does not use drugs.    Review of Systems   HYPERTENSION: She is on amlodipine 5 mg once daily and Diovan HCT 320/12.5  Also has microalbuminuria and because of this as well as tendency to edema she was tried on diltiazem but she felt that this was causing hair loss and constipation and is back on amlodipine Has had some high readings when she is having more pain but blood pressure appears better today  She is not checking blood pressure at home  BP Readings from Last 3 Encounters:  06/25/19 140/70  06/22/19 (!) 163/84  05/04/19 (!) 160/80    On Lasix 40 mg for edema daily with good control The dose was reduced when starting [place]   LIPIDS:  She is taking rosuvastatin 40 mg, previously LDL was high with simvastatin 20 mg LDL below 100 Crestor prescribed by PCP      Lab Results  Component Value Date   CHOL 157 04/17/2018   HDL 51.00 04/17/2018   LDLCALC 77 04/17/2018   LDLDIRECT 129.5 02/06/2012   TRIG 145.0 04/17/2018   CHOLHDL 3 04/17/2018                  Thyroid:      She has been hypothyroid for over 40 years and is taking a supplement of 137 g levothyroxine, 6 tablets per week Has not needed change in the dosage for some time She takes levothyroxine before breakfast daily  TSH has been very consistently normal  Last labs:   Lab Results  Component Value Date   TSH 1.24 01/22/2019   TSH 0.63 07/21/2018   TSH 1.95 10/10/2017   FREET4 1.11 07/21/2018   FREET4 1.05 10/10/2017   FREET4 1.13 04/11/2017   Iron deficiency anemia: She has constipation from  iron supplements and is only taking a multivitamin, did not try tandem as recommended Still has persistent anemia  Lab Results  Component Value Date   HGB 11.0 (L) 06/22/2019     Physical Examination:  BP 140/70 (BP Location: Left Arm, Patient Position: Sitting, Cuff Size: Normal)   Pulse 87   Ht 5\' 5"  (1.651 m)   Wt 238 lb (108 kg)   SpO2 96%   BMI 39.61 kg/m   No ankle edema present  ASSESSMENT/PLAN:  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:  See history of present illness for detailed discussion of current diabetes management, blood sugar patterns and problems identified  She is on basal bolus insulin, [place]  and metformin  A1c is excellent at 6.3 compared to 6.9  With using Farxiga over the last 6 weeks or so her blood sugars are much lower and averaging only 79 in the morning She did not call to report low sugars and is not symptomatic either with low morning readings in the 50s She did not reduce her [place] Aas by directed However most of her low sugars have been this week only She has lost a significant amount of weight  Recommendations:  Check the blood sugars by rotation after lunch and dinner or at bedtime  Reduce TRESIBA by 10 units and go down to 40  May need to reduce this further  May also consider a higher dose of Farxiga  Will need to adjust her NovoLog based on readings after meals, discussed blood sugar targets after meals  HYPERTENSION with microalbuminuria: Blood pressure is appearing better controlled and may be benefiting from adding [place] also She will stay with 5 mg amlodipine as this is not causing edema  EDEMA: Appears better controlled with using [place] and requiring only 40 mg Lasix Renal function stable  HYPOTHYROIDISM: her TSH is usually normal and will have this checked again on the next visit  IRON deficiency: She can look at getting tandem brand OTC for more effective iron supplementation   There are no Patient Instructions on  file for this visit.   Elayne Snare 06/25/2019, 9:52 AM   Note: This office note was prepared with Dragon voice recognition system technology. Any transcriptional errors that result from this process are unintentional.

## 2019-06-25 ENCOUNTER — Other Ambulatory Visit: Payer: Self-pay

## 2019-06-25 ENCOUNTER — Other Ambulatory Visit: Payer: Medicare Other

## 2019-06-25 ENCOUNTER — Encounter: Payer: Self-pay | Admitting: Endocrinology

## 2019-06-25 ENCOUNTER — Ambulatory Visit (INDEPENDENT_AMBULATORY_CARE_PROVIDER_SITE_OTHER): Payer: Medicare Other | Admitting: Endocrinology

## 2019-06-25 VITALS — BP 140/70 | HR 87 | Ht 65.0 in | Wt 238.0 lb

## 2019-06-25 DIAGNOSIS — D649 Anemia, unspecified: Secondary | ICD-10-CM | POA: Diagnosis not present

## 2019-06-25 DIAGNOSIS — E1165 Type 2 diabetes mellitus with hyperglycemia: Secondary | ICD-10-CM

## 2019-06-25 DIAGNOSIS — Z794 Long term (current) use of insulin: Secondary | ICD-10-CM

## 2019-06-25 DIAGNOSIS — I10 Essential (primary) hypertension: Secondary | ICD-10-CM | POA: Diagnosis not present

## 2019-06-25 DIAGNOSIS — E039 Hypothyroidism, unspecified: Secondary | ICD-10-CM | POA: Diagnosis not present

## 2019-06-25 NOTE — Patient Instructions (Addendum)
Check blood sugars on waking up 5 days a week  Also check blood sugars about 2 hours after meals and do this after different meals by rotation  Recommended blood sugar levels on waking up are 90-130 and about 2 hours after meal is 130-180  Please bring your blood sugar monitor to each visit, thank you  TRESIBA 40 UNITS

## 2019-06-26 ENCOUNTER — Other Ambulatory Visit: Payer: Self-pay | Admitting: Endocrinology

## 2019-06-28 ENCOUNTER — Other Ambulatory Visit (HOSPITAL_COMMUNITY)
Admission: RE | Admit: 2019-06-28 | Discharge: 2019-06-28 | Disposition: A | Discharge status: Home / Self Care | Payer: Medicare Other | Source: Ambulatory Visit | Attending: Orthopedic Surgery | Admitting: Orthopedic Surgery

## 2019-06-28 DIAGNOSIS — Z20822 Contact with and (suspected) exposure to covid-19: Secondary | ICD-10-CM | POA: Diagnosis not present

## 2019-06-28 DIAGNOSIS — Z01812 Encounter for preprocedural laboratory examination: Secondary | ICD-10-CM | POA: Insufficient documentation

## 2019-06-28 LAB — SARS CORONAVIRUS 2 (TAT 6-24 HRS): SARS Coronavirus 2: NEGATIVE

## 2019-06-29 ENCOUNTER — Other Ambulatory Visit: Payer: Self-pay | Admitting: Internal Medicine

## 2019-06-30 ENCOUNTER — Other Ambulatory Visit: Payer: Self-pay | Admitting: Endocrinology

## 2019-06-30 MED ORDER — VANCOMYCIN HCL 1500 MG/300ML IV SOLN
1500.0000 mg | INTRAVENOUS | Status: AC
  Administered 2019-07-01: 11:00:00 1500 mg via INTRAVENOUS
  Filled 2019-06-30 (×2): qty 300

## 2019-06-30 NOTE — Telephone Encounter (Signed)
May refill, no change made on the last visit

## 2019-06-30 NOTE — Telephone Encounter (Signed)
Outpatient Medication Detail   Disp Refills Start End   valsartan-hydrochlorothiazide (DIOVAN-HCT) 320-12.5 MG tablet 90 tablet 0 06/30/2019    Sig: Take 1 tablet by mouth once daily   Sent to pharmacy as: valsartan-hydrochlorothiazide (DIOVAN-HCT) 320-12.5 MG tablet   E-Prescribing Status: Receipt confirmed by pharmacy (06/30/2019  8:17 AM EDT)

## 2019-06-30 NOTE — Telephone Encounter (Signed)
Please advise if refill is appropriate 

## 2019-07-01 ENCOUNTER — Ambulatory Visit (HOSPITAL_COMMUNITY): Payer: Medicare Other | Admitting: Anesthesiology

## 2019-07-01 ENCOUNTER — Ambulatory Visit (HOSPITAL_COMMUNITY): Payer: Medicare Other | Admitting: Physician Assistant

## 2019-07-01 ENCOUNTER — Encounter (HOSPITAL_COMMUNITY): Payer: Self-pay | Admitting: Orthopedic Surgery

## 2019-07-01 ENCOUNTER — Inpatient Hospital Stay (HOSPITAL_COMMUNITY)
Admission: AD | Admit: 2019-07-01 | Discharge: 2019-07-04 | DRG: 470 | Disposition: A | Discharge status: Home Health | Payer: Medicare Other | Attending: Orthopedic Surgery | Admitting: Orthopedic Surgery

## 2019-07-01 ENCOUNTER — Other Ambulatory Visit: Payer: Self-pay

## 2019-07-01 ENCOUNTER — Encounter (HOSPITAL_COMMUNITY)
Admission: AD | Disposition: A | Discharge status: Home Health | Payer: Self-pay | Source: Home / Self Care | Attending: Orthopedic Surgery

## 2019-07-01 DIAGNOSIS — E1169 Type 2 diabetes mellitus with other specified complication: Secondary | ICD-10-CM | POA: Diagnosis not present

## 2019-07-01 DIAGNOSIS — E785 Hyperlipidemia, unspecified: Secondary | ICD-10-CM | POA: Diagnosis present

## 2019-07-01 DIAGNOSIS — M0579 Rheumatoid arthritis with rheumatoid factor of multiple sites without organ or systems involvement: Secondary | ICD-10-CM | POA: Diagnosis present

## 2019-07-01 DIAGNOSIS — Z87891 Personal history of nicotine dependence: Secondary | ICD-10-CM

## 2019-07-01 DIAGNOSIS — M8580 Other specified disorders of bone density and structure, unspecified site: Secondary | ICD-10-CM | POA: Diagnosis present

## 2019-07-01 DIAGNOSIS — Z90710 Acquired absence of both cervix and uterus: Secondary | ICD-10-CM

## 2019-07-01 DIAGNOSIS — Z8249 Family history of ischemic heart disease and other diseases of the circulatory system: Secondary | ICD-10-CM | POA: Diagnosis not present

## 2019-07-01 DIAGNOSIS — Z79899 Other long term (current) drug therapy: Secondary | ICD-10-CM

## 2019-07-01 DIAGNOSIS — Z888 Allergy status to other drugs, medicaments and biological substances status: Secondary | ICD-10-CM

## 2019-07-01 DIAGNOSIS — D638 Anemia in other chronic diseases classified elsewhere: Secondary | ICD-10-CM | POA: Diagnosis not present

## 2019-07-01 DIAGNOSIS — E7800 Pure hypercholesterolemia, unspecified: Secondary | ICD-10-CM | POA: Diagnosis present

## 2019-07-01 DIAGNOSIS — K219 Gastro-esophageal reflux disease without esophagitis: Secondary | ICD-10-CM | POA: Diagnosis not present

## 2019-07-01 DIAGNOSIS — Z83438 Family history of other disorder of lipoprotein metabolism and other lipidemia: Secondary | ICD-10-CM | POA: Diagnosis not present

## 2019-07-01 DIAGNOSIS — Z91041 Radiographic dye allergy status: Secondary | ICD-10-CM

## 2019-07-01 DIAGNOSIS — E538 Deficiency of other specified B group vitamins: Secondary | ICD-10-CM | POA: Diagnosis present

## 2019-07-01 DIAGNOSIS — Z6839 Body mass index (BMI) 39.0-39.9, adult: Secondary | ICD-10-CM

## 2019-07-01 DIAGNOSIS — E039 Hypothyroidism, unspecified: Secondary | ICD-10-CM | POA: Diagnosis present

## 2019-07-01 DIAGNOSIS — I10 Essential (primary) hypertension: Secondary | ICD-10-CM | POA: Diagnosis present

## 2019-07-01 DIAGNOSIS — Z880 Allergy status to penicillin: Secondary | ICD-10-CM

## 2019-07-01 DIAGNOSIS — M1712 Unilateral primary osteoarthritis, left knee: Principal | ICD-10-CM | POA: Diagnosis present

## 2019-07-01 DIAGNOSIS — Z833 Family history of diabetes mellitus: Secondary | ICD-10-CM | POA: Diagnosis not present

## 2019-07-01 DIAGNOSIS — Z96652 Presence of left artificial knee joint: Secondary | ICD-10-CM | POA: Diagnosis not present

## 2019-07-01 HISTORY — PX: TOTAL KNEE ARTHROPLASTY: SHX125

## 2019-07-01 LAB — GLUCOSE, CAPILLARY
Glucose-Capillary: 65 mg/dL — ABNORMAL LOW (ref 70–99)
Glucose-Capillary: 95 mg/dL (ref 70–99)
Glucose-Capillary: 107 mg/dL — ABNORMAL HIGH (ref 70–99)
Glucose-Capillary: 57 mg/dL — ABNORMAL LOW (ref 70–99)
Glucose-Capillary: 65 mg/dL — ABNORMAL LOW (ref 70–99)
Glucose-Capillary: 66 mg/dL — ABNORMAL LOW (ref 70–99)
Glucose-Capillary: 85 mg/dL (ref 70–99)
Glucose-Capillary: 185 mg/dL — ABNORMAL HIGH (ref 70–99)

## 2019-07-01 SURGERY — ARTHROPLASTY, KNEE, TOTAL
Anesthesia: Monitor Anesthesia Care | Site: Knee | Laterality: Left

## 2019-07-01 MED ORDER — PHENYLEPHRINE HCL (PRESSORS) 10 MG/ML IV SOLN
INTRAVENOUS | Status: DC | PRN
  Administered 2019-07-01: 120 ug via INTRAVENOUS

## 2019-07-01 MED ORDER — LEVOTHYROXINE SODIUM 137 MCG PO TABS
137.0000 ug | ORAL_TABLET | ORAL | Status: DC
  Administered 2019-07-02 – 2019-07-03 (×2): 137 ug via ORAL
  Filled 2019-07-01 (×2): qty 1

## 2019-07-01 MED ORDER — MORPHINE SULFATE (PF) 4 MG/ML IV SOLN
INTRAVENOUS | Status: AC
  Filled 2019-07-01: qty 2

## 2019-07-01 MED ORDER — TRANEXAMIC ACID 1000 MG/10ML IV SOLN
INTRAVENOUS | Status: DC | PRN
  Administered 2019-07-01: 2000 mg via TOPICAL

## 2019-07-01 MED ORDER — AMLODIPINE BESYLATE 5 MG PO TABS
5.0000 mg | ORAL_TABLET | Freq: Every day | ORAL | Status: DC
  Administered 2019-07-02 – 2019-07-04 (×3): 5 mg via ORAL
  Filled 2019-07-01 (×3): qty 1

## 2019-07-01 MED ORDER — DEXTROSE 50 % IV SOLN
INTRAVENOUS | Status: AC
  Administered 2019-07-01: 25 mL via INTRAVENOUS
  Filled 2019-07-01: qty 50

## 2019-07-01 MED ORDER — INSULIN ASPART 100 UNIT/ML ~~LOC~~ SOLN
6.0000 [IU] | Freq: Three times a day (TID) | SUBCUTANEOUS | Status: DC
  Administered 2019-07-02 – 2019-07-04 (×6): 6 [IU] via SUBCUTANEOUS

## 2019-07-01 MED ORDER — IRRISEPT - 450ML BOTTLE WITH 0.05% CHG IN STERILE WATER, USP 99.95% OPTIME
TOPICAL | Status: DC | PRN
  Administered 2019-07-01: 450 mL

## 2019-07-01 MED ORDER — DEXTROSE 50 % IV SOLN
25.0000 mL | Freq: Once | INTRAVENOUS | Status: AC
  Administered 2019-07-01: 25 mL via INTRAVENOUS
  Filled 2019-07-01: qty 50

## 2019-07-01 MED ORDER — TRANEXAMIC ACID-NACL 1000-0.7 MG/100ML-% IV SOLN
1000.0000 mg | INTRAVENOUS | Status: AC
  Administered 2019-07-01: 1000 mg via INTRAVENOUS

## 2019-07-01 MED ORDER — ONDANSETRON HCL 4 MG/2ML IJ SOLN: 4.0000 mg | Freq: Once | INTRAMUSCULAR | Status: DC | PRN

## 2019-07-01 MED ORDER — ROSUVASTATIN CALCIUM 20 MG PO TABS
40.0000 mg | ORAL_TABLET | Freq: Every day | ORAL | Status: DC
  Administered 2019-07-01 – 2019-07-03 (×3): 40 mg via ORAL
  Filled 2019-07-01 (×3): qty 2

## 2019-07-01 MED ORDER — DEXAMETHASONE SODIUM PHOSPHATE 4 MG/ML IJ SOLN
INTRAMUSCULAR | Status: DC | PRN
  Administered 2019-07-01: 5 mg via INTRAVENOUS

## 2019-07-01 MED ORDER — MEPERIDINE HCL 25 MG/ML IJ SOLN: 6.2500 mg | INTRAMUSCULAR | Status: DC | PRN

## 2019-07-01 MED ORDER — ONDANSETRON HCL 4 MG/2ML IJ SOLN
4.0000 mg | Freq: Four times a day (QID) | INTRAMUSCULAR | Status: DC | PRN
  Administered 2019-07-02: 4 mg via INTRAVENOUS
  Filled 2019-07-01: qty 2

## 2019-07-01 MED ORDER — HYDROXYCHLOROQUINE SULFATE 200 MG PO TABS
200.0000 mg | ORAL_TABLET | Freq: Two times a day (BID) | ORAL | Status: DC
  Administered 2019-07-01 – 2019-07-04 (×6): 200 mg via ORAL
  Filled 2019-07-01 (×7): qty 1

## 2019-07-01 MED ORDER — HYDROMORPHONE HCL 1 MG/ML IJ SOLN: 0.2500 mg | INTRAMUSCULAR | Status: DC | PRN

## 2019-07-01 MED ORDER — PROPOFOL 10 MG/ML IV BOLUS
INTRAVENOUS | Status: AC
  Filled 2019-07-01: qty 40

## 2019-07-01 MED ORDER — ONDANSETRON HCL 4 MG/2ML IJ SOLN
INTRAMUSCULAR | Status: DC | PRN
  Administered 2019-07-01: 4 mg via INTRAVENOUS

## 2019-07-01 MED ORDER — MENTHOL 3 MG MT LOZG: 1.0000 | LOZENGE | OROMUCOSAL | Status: DC | PRN

## 2019-07-01 MED ORDER — FENTANYL CITRATE (PF) 250 MCG/5ML IJ SOLN
INTRAMUSCULAR | Status: AC
  Filled 2019-07-01: qty 5

## 2019-07-01 MED ORDER — HYDROMORPHONE HCL 1 MG/ML IJ SOLN
0.2500 mg | INTRAMUSCULAR | Status: DC | PRN
  Administered 2019-07-01 (×2): 0.5 mg via INTRAVENOUS

## 2019-07-01 MED ORDER — DEXTROSE 50 % IV SOLN
25.0000 mL | Freq: Once | INTRAVENOUS | Status: AC
  Filled 2019-07-01: qty 50

## 2019-07-01 MED ORDER — TRANEXAMIC ACID-NACL 1000-0.7 MG/100ML-% IV SOLN
INTRAVENOUS | Status: AC
  Filled 2019-07-01: qty 100

## 2019-07-01 MED ORDER — PHENOL 1.4 % MT LIQD: 1.0000 | OROMUCOSAL | Status: DC | PRN

## 2019-07-01 MED ORDER — METHOTREXATE 2.5 MG PO TABS: 17.5000 mg | ORAL_TABLET | ORAL | Status: DC

## 2019-07-01 MED ORDER — PROPOFOL 10 MG/ML IV BOLUS
INTRAVENOUS | Status: DC | PRN
  Administered 2019-07-01: 30 mg via INTRAVENOUS

## 2019-07-01 MED ORDER — PHENYLEPHRINE HCL-NACL 10-0.9 MG/250ML-% IV SOLN
INTRAVENOUS | Status: DC | PRN
  Administered 2019-07-01: 20 ug/min via INTRAVENOUS

## 2019-07-01 MED ORDER — FENTANYL CITRATE (PF) 100 MCG/2ML IJ SOLN
INTRAMUSCULAR | Status: DC | PRN
  Administered 2019-07-01 (×3): 50 ug via INTRAVENOUS

## 2019-07-01 MED ORDER — LACTATED RINGERS IV SOLN: INTRAVENOUS | Status: DC

## 2019-07-01 MED ORDER — TRANEXAMIC ACID 1000 MG/10ML IV SOLN
2000.0000 mg | Freq: Once | INTRAVENOUS | Status: DC
  Filled 2019-07-01: qty 20

## 2019-07-01 MED ORDER — METFORMIN HCL 500 MG PO TABS
1000.0000 mg | ORAL_TABLET | Freq: Two times a day (BID) | ORAL | Status: DC
  Administered 2019-07-02 – 2019-07-04 (×5): 1000 mg via ORAL
  Filled 2019-07-01 (×5): qty 2

## 2019-07-01 MED ORDER — DOCUSATE SODIUM 100 MG PO CAPS
100.0000 mg | ORAL_CAPSULE | Freq: Two times a day (BID) | ORAL | Status: DC
  Administered 2019-07-01 – 2019-07-04 (×6): 100 mg via ORAL
  Filled 2019-07-01 (×6): qty 1

## 2019-07-01 MED ORDER — POVIDONE-IODINE 10 % EX SWAB: 2.0000 "application " | Freq: Once | CUTANEOUS | Status: DC

## 2019-07-01 MED ORDER — ONDANSETRON HCL 4 MG PO TABS
4.0000 mg | ORAL_TABLET | Freq: Four times a day (QID) | ORAL | Status: DC | PRN
  Filled 2019-07-01 (×2): qty 1

## 2019-07-01 MED ORDER — GABAPENTIN 300 MG PO CAPS
300.0000 mg | ORAL_CAPSULE | Freq: Three times a day (TID) | ORAL | Status: DC
  Administered 2019-07-01 – 2019-07-04 (×8): 300 mg via ORAL
  Filled 2019-07-01 (×8): qty 1

## 2019-07-01 MED ORDER — FENTANYL CITRATE (PF) 100 MCG/2ML IJ SOLN: 50.0000 ug | Freq: Once | INTRAMUSCULAR | Status: AC

## 2019-07-01 MED ORDER — MIDAZOLAM HCL 2 MG/2ML IJ SOLN: 1.0000 mg | Freq: Once | INTRAMUSCULAR | Status: AC

## 2019-07-01 MED ORDER — BUPIVACAINE LIPOSOME 1.3 % IJ SUSP
20.0000 mL | Freq: Once | INTRAMUSCULAR | Status: DC
  Filled 2019-07-01: qty 20

## 2019-07-01 MED ORDER — METOCLOPRAMIDE HCL 5 MG/ML IJ SOLN: 5.0000 mg | Freq: Three times a day (TID) | INTRAMUSCULAR | Status: DC | PRN

## 2019-07-01 MED ORDER — FENTANYL CITRATE (PF) 100 MCG/2ML IJ SOLN
INTRAMUSCULAR | Status: AC
  Administered 2019-07-01: 12:00:00 50 ug via INTRAVENOUS
  Filled 2019-07-01: qty 2

## 2019-07-01 MED ORDER — OXYCODONE HCL 5 MG PO TABS
5.0000 mg | ORAL_TABLET | ORAL | Status: DC | PRN
  Administered 2019-07-01 – 2019-07-04 (×9): 10 mg via ORAL
  Filled 2019-07-01 (×9): qty 2

## 2019-07-01 MED ORDER — PROPOFOL 500 MG/50ML IV EMUL
INTRAVENOUS | Status: DC | PRN
  Administered 2019-07-01: 75 ug/kg/min via INTRAVENOUS

## 2019-07-01 MED ORDER — CHLORHEXIDINE GLUCONATE 4 % EX LIQD: 60.0000 mL | Freq: Once | CUTANEOUS | Status: DC

## 2019-07-01 MED ORDER — TRANEXAMIC ACID-NACL 1000-0.7 MG/100ML-% IV SOLN
INTRAVENOUS | Status: AC
  Filled 2019-07-01: qty 200

## 2019-07-01 MED ORDER — INSULIN ASPART 100 UNIT/ML ~~LOC~~ SOLN
0.0000 [IU] | Freq: Three times a day (TID) | SUBCUTANEOUS | Status: DC
  Administered 2019-07-02: 7 [IU] via SUBCUTANEOUS
  Administered 2019-07-02: 4 [IU] via SUBCUTANEOUS
  Administered 2019-07-03: 3 [IU] via SUBCUTANEOUS
  Administered 2019-07-03: 09:00:00 4 [IU] via SUBCUTANEOUS
  Administered 2019-07-04: 3 [IU] via SUBCUTANEOUS

## 2019-07-01 MED ORDER — HYDROMORPHONE HCL 1 MG/ML IJ SOLN
INTRAMUSCULAR | Status: AC
  Filled 2019-07-01: qty 1

## 2019-07-01 MED ORDER — OXYCODONE HCL 5 MG PO TABS
ORAL_TABLET | ORAL | Status: AC
  Filled 2019-07-01: qty 2

## 2019-07-01 MED ORDER — EPHEDRINE SULFATE 50 MG/ML IJ SOLN
INTRAMUSCULAR | Status: DC | PRN
  Administered 2019-07-01 (×2): 5 mg via INTRAVENOUS

## 2019-07-01 MED ORDER — METOCLOPRAMIDE HCL 5 MG PO TABS: 5.0000 mg | ORAL_TABLET | Freq: Three times a day (TID) | ORAL | Status: DC | PRN

## 2019-07-01 MED ORDER — SODIUM CHLORIDE 0.9% FLUSH
INTRAVENOUS | Status: DC | PRN
  Administered 2019-07-01: 20 mL via INTRAVENOUS

## 2019-07-01 MED ORDER — 0.9 % SODIUM CHLORIDE (POUR BTL) OPTIME
TOPICAL | Status: DC | PRN
  Administered 2019-07-01: 1000 mL
  Administered 2019-07-01: 3000 mL

## 2019-07-01 MED ORDER — MIDAZOLAM HCL 2 MG/2ML IJ SOLN: 0.5000 mg | Freq: Once | INTRAMUSCULAR | Status: DC | PRN

## 2019-07-01 MED ORDER — CLONIDINE HCL (ANALGESIA) 100 MCG/ML EP SOLN
EPIDURAL | Status: AC
  Filled 2019-07-01: qty 10

## 2019-07-01 MED ORDER — IRBESARTAN 300 MG PO TABS
300.0000 mg | ORAL_TABLET | Freq: Every day | ORAL | Status: DC
  Administered 2019-07-02 – 2019-07-04 (×3): 300 mg via ORAL
  Filled 2019-07-01 (×3): qty 1

## 2019-07-01 MED ORDER — MORPHINE SULFATE (PF) 4 MG/ML IV SOLN
INTRAVENOUS | Status: DC | PRN
  Administered 2019-07-01: 8 mg via INTRAVENOUS

## 2019-07-01 MED ORDER — MIDAZOLAM HCL 2 MG/2ML IJ SOLN
INTRAMUSCULAR | Status: AC
  Administered 2019-07-01: 12:00:00 1 mg via INTRAVENOUS
  Filled 2019-07-01: qty 2

## 2019-07-01 MED ORDER — CLONIDINE HCL (ANALGESIA) 100 MCG/ML EP SOLN
EPIDURAL | Status: DC | PRN
  Administered 2019-07-01: 1 mL

## 2019-07-01 MED ORDER — HYDROMORPHONE HCL 1 MG/ML IJ SOLN
0.5000 mg | INTRAMUSCULAR | Status: DC | PRN
  Administered 2019-07-01 – 2019-07-03 (×7): 0.5 mg via INTRAVENOUS
  Filled 2019-07-01 (×7): qty 1

## 2019-07-01 MED ORDER — SODIUM CHLORIDE 0.9 % IR SOLN
Status: DC | PRN
  Administered 2019-07-01: 3000 mL

## 2019-07-01 MED ORDER — VANCOMYCIN HCL IN DEXTROSE 1-5 GM/200ML-% IV SOLN
1000.0000 mg | Freq: Two times a day (BID) | INTRAVENOUS | Status: AC
  Administered 2019-07-01: 1000 mg via INTRAVENOUS
  Filled 2019-07-01: qty 200

## 2019-07-01 MED ORDER — BUPIVACAINE HCL 0.25 % IJ SOLN
INTRAMUSCULAR | Status: DC | PRN
  Administered 2019-07-01: 20 mL

## 2019-07-01 MED ORDER — BUPIVACAINE HCL (PF) 0.75 % IJ SOLN
INTRAMUSCULAR | Status: DC | PRN
  Administered 2019-07-01: 2 mL

## 2019-07-01 MED ORDER — METHOCARBAMOL 500 MG PO TABS
500.0000 mg | ORAL_TABLET | Freq: Four times a day (QID) | ORAL | Status: DC | PRN
  Administered 2019-07-01 – 2019-07-04 (×7): 500 mg via ORAL
  Filled 2019-07-01 (×7): qty 1

## 2019-07-01 MED ORDER — HYDROCHLOROTHIAZIDE 12.5 MG PO CAPS
12.5000 mg | ORAL_CAPSULE | Freq: Every day | ORAL | Status: DC
  Administered 2019-07-02 – 2019-07-04 (×3): 12.5 mg via ORAL
  Filled 2019-07-01 (×2): qty 1

## 2019-07-01 MED ORDER — VALSARTAN-HYDROCHLOROTHIAZIDE 320-12.5 MG PO TABS: 1.0000 | ORAL_TABLET | Freq: Every day | ORAL | Status: DC

## 2019-07-01 MED ORDER — CANAGLIFLOZIN 100 MG PO TABS
100.0000 mg | ORAL_TABLET | Freq: Every day | ORAL | Status: DC
  Administered 2019-07-02 – 2019-07-04 (×3): 100 mg via ORAL
  Filled 2019-07-01 (×3): qty 1

## 2019-07-01 MED ORDER — BUPIVACAINE HCL (PF) 0.25 % IJ SOLN
INTRAMUSCULAR | Status: AC
  Filled 2019-07-01: qty 30

## 2019-07-01 MED ORDER — PROMETHAZINE HCL 25 MG/ML IJ SOLN: 6.2500 mg | INTRAMUSCULAR | Status: DC | PRN

## 2019-07-01 MED ORDER — ACETAMINOPHEN 500 MG PO TABS
1000.0000 mg | ORAL_TABLET | Freq: Four times a day (QID) | ORAL | Status: AC
  Administered 2019-07-01 – 2019-07-02 (×4): 1000 mg via ORAL
  Filled 2019-07-01 (×4): qty 2

## 2019-07-01 MED ORDER — METHOCARBAMOL 1000 MG/10ML IJ SOLN
500.0000 mg | Freq: Four times a day (QID) | INTRAVENOUS | Status: DC | PRN
  Filled 2019-07-01: qty 5

## 2019-07-01 MED ORDER — ASPIRIN 81 MG PO CHEW
81.0000 mg | CHEWABLE_TABLET | Freq: Two times a day (BID) | ORAL | Status: DC
  Administered 2019-07-01 – 2019-07-04 (×6): 81 mg via ORAL
  Filled 2019-07-01 (×6): qty 1

## 2019-07-01 MED ORDER — FUROSEMIDE 40 MG PO TABS
40.0000 mg | ORAL_TABLET | Freq: Every day | ORAL | Status: DC
  Administered 2019-07-02 – 2019-07-04 (×3): 40 mg via ORAL
  Filled 2019-07-01 (×3): qty 1

## 2019-07-01 MED ORDER — BUPIVACAINE LIPOSOME 1.3 % IJ SUSP
INTRAMUSCULAR | Status: DC | PRN
  Administered 2019-07-01: 20 mL

## 2019-07-01 SURGICAL SUPPLY — 79 items
BAG DECANTER FOR FLEXI CONT (MISCELLANEOUS) ×2 IMPLANT
BANDAGE ESMARK 6X9 LF (GAUZE/BANDAGES/DRESSINGS) ×1 IMPLANT
BLADE SAG 18X100X1.27 (BLADE) ×2 IMPLANT
BNDG CMPR 9X6 STRL LF SNTH (GAUZE/BANDAGES/DRESSINGS) ×1
BNDG CMPR MED 15X6 ELC VLCR LF (GAUZE/BANDAGES/DRESSINGS) ×1
BNDG COHESIVE 6X5 TAN STRL LF (GAUZE/BANDAGES/DRESSINGS) ×2 IMPLANT
BNDG ELASTIC 6X15 VLCR STRL LF (GAUZE/BANDAGES/DRESSINGS) ×2 IMPLANT
BNDG ESMARK 6X9 LF (GAUZE/BANDAGES/DRESSINGS) ×2
BOWL SMART MIX CTS (DISPOSABLE) IMPLANT
BSPLAT TIB 4 KN TRITANIUM (Knees) ×1 IMPLANT
CNTNR URN SCR LID CUP LEK RST (MISCELLANEOUS) ×1 IMPLANT
COMP FEMORAL TRIATHLON SZ3 (Joint) ×2 IMPLANT
COMPONENT FEMRL TRIATHLON SZ3 (Joint) IMPLANT
CONT SPEC 4OZ STRL OR WHT (MISCELLANEOUS) ×2
COVER SURGICAL LIGHT HANDLE (MISCELLANEOUS) ×2 IMPLANT
COVER WAND RF STERILE (DRAPES) ×2 IMPLANT
CUFF TOURN SGL QUICK 34 (TOURNIQUET CUFF) ×2
CUFF TOURN SGL QUICK 42 (TOURNIQUET CUFF) IMPLANT
CUFF TRNQT CYL 34X4.125X (TOURNIQUET CUFF) ×1 IMPLANT
DECANTER SPIKE VIAL GLASS SM (MISCELLANEOUS) ×2 IMPLANT
DRAPE INCISE IOBAN 66X45 STRL (DRAPES) IMPLANT
DRAPE ORTHO SPLIT 77X108 STRL (DRAPES) ×6
DRAPE SURG ORHT 6 SPLT 77X108 (DRAPES) ×3 IMPLANT
DRAPE U-SHAPE 47X51 STRL (DRAPES) ×2 IMPLANT
DRSG AQUACEL AG ADV 3.5X14 (GAUZE/BANDAGES/DRESSINGS) ×1 IMPLANT
DURAPREP 26ML APPLICATOR (WOUND CARE) ×4 IMPLANT
ELECT CAUTERY BLADE 6.4 (BLADE) ×2 IMPLANT
ELECT REM PT RETURN 9FT ADLT (ELECTROSURGICAL) ×2
ELECTRODE REM PT RTRN 9FT ADLT (ELECTROSURGICAL) ×1 IMPLANT
GAUZE SPONGE 4X4 12PLY STRL (GAUZE/BANDAGES/DRESSINGS) ×2 IMPLANT
GLOVE BIOGEL PI IND STRL 7.0 (GLOVE) ×1 IMPLANT
GLOVE BIOGEL PI IND STRL 8 (GLOVE) ×1 IMPLANT
GLOVE BIOGEL PI INDICATOR 7.0 (GLOVE) ×1
GLOVE BIOGEL PI INDICATOR 8 (GLOVE) ×1
GLOVE ECLIPSE 7.0 STRL STRAW (GLOVE) ×2 IMPLANT
GLOVE ECLIPSE 8.0 STRL XLNG CF (GLOVE) ×2 IMPLANT
GOWN STRL REUS W/ TWL LRG LVL3 (GOWN DISPOSABLE) ×3 IMPLANT
GOWN STRL REUS W/TWL LRG LVL3 (GOWN DISPOSABLE) ×6
HANDPIECE INTERPULSE COAX TIP (DISPOSABLE) ×2
HOOD PEEL AWAY FLYTE STAYCOOL (MISCELLANEOUS) ×6 IMPLANT
IMMOBILIZER KNEE 20 (SOFTGOODS)
IMMOBILIZER KNEE 20 THIGH 36 (SOFTGOODS) IMPLANT
IMMOBILIZER KNEE 22 UNIV (SOFTGOODS) ×1 IMPLANT
IMMOBILIZER KNEE 24 THIGH 36 (MISCELLANEOUS) IMPLANT
IMMOBILIZER KNEE 24 UNIV (MISCELLANEOUS)
INSERT TIB BEARING X3 SZ4 11 (Joint) ×1 IMPLANT
KIT BASIN OR (CUSTOM PROCEDURE TRAY) ×2 IMPLANT
KIT TURNOVER KIT B (KITS) ×2 IMPLANT
KNEE PATELLA ASYMMETRIC 10X32 (Knees) ×1 IMPLANT
KNEE TIBIAL COMP TRI SZ4 (Knees) ×1 IMPLANT
MANIFOLD NEPTUNE II (INSTRUMENTS) ×2 IMPLANT
NDL SPNL 18GX3.5 QUINCKE PK (NEEDLE) ×1 IMPLANT
NEEDLE 22X1 1/2 (OR ONLY) (NEEDLE) ×4 IMPLANT
NEEDLE SPNL 18GX3.5 QUINCKE PK (NEEDLE) ×2 IMPLANT
NS IRRIG 1000ML POUR BTL (IV SOLUTION) ×4 IMPLANT
PACK TOTAL JOINT (CUSTOM PROCEDURE TRAY) ×2 IMPLANT
PAD ARMBOARD 7.5X6 YLW CONV (MISCELLANEOUS) ×4 IMPLANT
PAD CAST 4YDX4 CTTN HI CHSV (CAST SUPPLIES) ×1 IMPLANT
PADDING CAST COTTON 4X4 STRL (CAST SUPPLIES) ×2
PADDING CAST COTTON 6X4 STRL (CAST SUPPLIES) ×2 IMPLANT
PIN FLUTED HEDLESS FIX 3.5X1/8 (PIN) ×1 IMPLANT
SET HNDPC FAN SPRY TIP SCT (DISPOSABLE) ×1 IMPLANT
STRIP CLOSURE SKIN 1/2X4 (GAUZE/BANDAGES/DRESSINGS) ×4 IMPLANT
SUCTION FRAZIER HANDLE 10FR (MISCELLANEOUS) ×2
SUCTION TUBE FRAZIER 10FR DISP (MISCELLANEOUS) ×1 IMPLANT
SUT MNCRL AB 3-0 PS2 18 (SUTURE) ×3 IMPLANT
SUT VIC AB 0 CT1 27 (SUTURE) ×8
SUT VIC AB 0 CT1 27XBRD ANBCTR (SUTURE) ×3 IMPLANT
SUT VIC AB 1 CT1 27 (SUTURE) ×12
SUT VIC AB 1 CT1 27XBRD ANBCTR (SUTURE) ×5 IMPLANT
SUT VIC AB 1 CT1 36 (SUTURE) ×1 IMPLANT
SUT VIC AB 2-0 CT1 27 (SUTURE) ×8
SUT VIC AB 2-0 CT1 TAPERPNT 27 (SUTURE) ×4 IMPLANT
SYR 30ML LL (SYRINGE) ×6 IMPLANT
SYR TB 1ML LUER SLIP (SYRINGE) ×2 IMPLANT
TOWEL GREEN STERILE (TOWEL DISPOSABLE) ×4 IMPLANT
TOWEL GREEN STERILE FF (TOWEL DISPOSABLE) ×4 IMPLANT
TRAY CATH 16FR W/PLASTIC CATH (SET/KITS/TRAYS/PACK) IMPLANT
WATER STERILE IRR 1000ML POUR (IV SOLUTION) IMPLANT

## 2019-07-01 NOTE — Anesthesia Preprocedure Evaluation (Signed)
Anesthesia Evaluation  Patient identified by MRN, date of birth, ID band Patient awake    Reviewed: Allergy & Precautions, NPO status , Patient's Chart, lab work & pertinent test results  Airway Mallampati: II  TM Distance: >3 FB Neck ROM: Full    Dental   Pulmonary former smoker,    breath sounds clear to auscultation       Cardiovascular hypertension, Pt. on medications (-) angina Rhythm:Regular Rate:Normal     Neuro/Psych negative neurological ROS     GI/Hepatic negative GI ROS, Neg liver ROS,   Endo/Other  diabetes, Type 2, Insulin DependentHypothyroidism Morbid obesity  Renal/GU negative Renal ROS     Musculoskeletal  (+) Arthritis ,   Abdominal   Peds  Hematology  (+) anemia ,   Anesthesia Other Findings   Reproductive/Obstetrics                             Lab Results  Component Value Date   WBC 12.3 (H) 06/22/2019   HGB 11.0 (L) 06/22/2019   HCT 35.7 (L) 06/22/2019   MCV 82.4 06/22/2019   PLT 378 06/22/2019   Lab Results  Component Value Date   CREATININE 0.77 06/22/2019   BUN 12 06/22/2019   NA 138 06/22/2019   K 3.8 06/22/2019   CL 102 06/22/2019   CO2 25 06/22/2019    Anesthesia Physical Anesthesia Plan  ASA: III  Anesthesia Plan: Spinal   Post-op Pain Management:  Regional for Post-op pain   Induction: Intravenous  PONV Risk Score and Plan: 2 and Dexamethasone, Ondansetron and Treatment may vary due to age or medical condition  Airway Management Planned: Natural Airway and Simple Face Mask  Additional Equipment: None  Intra-op Plan:   Post-operative Plan:   Informed Consent: I have reviewed the patients History and Physical, chart, labs and discussed the procedure including the risks, benefits and alternatives for the proposed anesthesia with the patient or authorized representative who has indicated his/her understanding and acceptance.        Plan Discussed with: CRNA  Anesthesia Plan Comments:         Anesthesia Quick Evaluation

## 2019-07-01 NOTE — Transfer of Care (Signed)
Immediate Anesthesia Transfer of Care Note  Patient: [place]  Procedure(s) Performed: LEFT TOTAL KNEE ARTHROPLASTY (Left Knee)  Patient Location: PACU  Anesthesia Type:MAC, Regional and Spinal  Level of Consciousness: awake, alert , oriented and patient cooperative  Airway & Oxygen Therapy: Patient Spontanous Breathing and Patient connected to nasal cannula oxygen  Post-op Assessment: Report given to RN and Post -op Vital signs reviewed and stable  Post vital signs: Reviewed and stable  Last Vitals:  Vitals Value Taken Time  BP    Temp    Pulse 75 07/01/19 1611  Resp 16 07/01/19 1611  SpO2 97 % 07/01/19 1611  Vitals shown include unvalidated device data.  Last Pain:  Vitals:   07/01/19 1039  TempSrc:   PainSc: 0-No pain      Patients Stated Pain Goal: 3 (81/77/11 6579)  Complications: No apparent anesthesia complications

## 2019-07-01 NOTE — Progress Notes (Signed)
Pt and husband updated on delay 

## 2019-07-01 NOTE — Anesthesia Postprocedure Evaluation (Signed)
Anesthesia Post Note  Patient: [place]  Procedure(s) Performed: LEFT TOTAL KNEE ARTHROPLASTY (Left Knee)     Patient location during evaluation: PACU Anesthesia Type: Regional and Spinal Level of consciousness: awake and alert, oriented and patient cooperative Pain management: pain level controlled Vital Signs Assessment: post-procedure vital signs reviewed and stable Respiratory status: spontaneous breathing, nonlabored ventilation and respiratory function stable Cardiovascular status: blood pressure returned to baseline and stable Postop Assessment: no apparent nausea or vomiting, adequate PO intake and spinal receding Anesthetic complications: no    Last Vitals:  Vitals:   07/01/19 1715 07/01/19 1730  BP: (!) 140/54 (!) 144/68  Pulse: 77 81  Resp: 17 16  Temp:    SpO2: 99% 99%    Last Pain:  Vitals:   07/01/19 1730  TempSrc:   PainSc: Asleep                 Jezabel Lecker,E. Destyne Goodreau

## 2019-07-01 NOTE — Anesthesia Procedure Notes (Signed)
Anesthesia Regional Block: Adductor canal block   Pre-Anesthetic Checklist: ,, timeout performed, Correct Patient, Correct Site, Correct Laterality, Correct Procedure, Correct Position, site marked, Risks and benefits discussed,  Surgical consent,  Pre-op evaluation,  At surgeon's request and post-op pain management  Laterality: Left  Prep: chloraprep       Needles:  Injection technique: Single-shot  Needle Type: Echogenic Needle     Needle Length: 9cm  Needle Gauge: 21     Additional Needles:   Procedures:,,,, ultrasound used (permanent image in chart),,,,  Narrative:  Start time: 07/01/2019 11:48 AM End time: 07/01/2019 11:54 AM Injection made incrementally with aspirations every 5 mL.  Performed by: Personally  Anesthesiologist: Suzette Battiest, MD

## 2019-07-01 NOTE — Anesthesia Procedure Notes (Signed)
Spinal  Patient location during procedure: OR Staffing Performed: anesthesiologist  Anesthesiologist: Asante Blanda E, MD Preanesthetic Checklist Completed: patient identified, IV checked, risks and benefits discussed, surgical consent, monitors and equipment checked, pre-op evaluation and timeout performed Spinal Block Patient position: sitting Prep: DuraPrep and site prepped and draped Patient monitoring: continuous pulse ox, blood pressure and heart rate Approach: midline Location: L3-4 Injection technique: single-shot Needle Needle type: Pencan  Needle gauge: 24 G Needle length: 9 cm Additional Notes Functioning IV was confirmed and monitors were applied. Sterile prep and drape, including hand hygiene and sterile gloves were used. The patient was positioned and the spine was prepped. The skin was anesthetized with lidocaine.  Free flow of clear CSF was obtained prior to injecting local anesthetic into the CSF. The needle was carefully withdrawn. The patient tolerated the procedure well.      

## 2019-07-01 NOTE — Op Note (Signed)
NAME: Marissa Roberts, Marissa Roberts MEDICAL RECORD [id number] ACCOUNT [id number] DATE OF BIRTH:[birth year] FACILITY: [facility] LOCATION: [facility] PHYSICIAN:Lynee Rosenbach Randel Pigg, MD  OPERATIVE REPORT  DATE OF PROCEDURE:  07/01/2019  PREOPERATIVE DIAGNOSIS:  Left knee arthritis.  POSTOPERATIVE DIAGNOSIS:  Left knee arthritis.  PROCEDURE:  Left total knee replacement.  ATTENDING SURGEON:  [name], MD   ASSISTANT:  Annie Main, PA  IMPLANTS:  Stryker press-fit 3 femur, 4 tibia, 11 mm deep dish polyethylene PCL retaining polyethylene and 32 mm 3-peg press-fit patellar.  Implant was PCL retaining.  INDICATIONS:  This is a 74 year old patient with end-stage left knee arthritis.  She presents now for operative management after explanation of risks and benefits.  She has failed conservative measures.  PROCEDURE IN DETAIL:  The patient was brought to the operating room where spinal anesthetic was induced.  Preoperative antibiotics were administered.  Timeout was called.  Left leg prescrubbed with alcohol and Betadine, allowed to air dry, prepped with  DuraPrep solution and draped in a sterile manner.  Ioban used to cover the operative field.  Leg was elevated and exsanguinated with the Esmarch wrap.  Tourniquet was inflated.  Timeout was called.  Anterior approach to knee was made.  Skin and  subcutaneous tissue were sharply divided.  Median parapatellar approach was made and marked with a #1 Vicryl suture.  Patella was everted.  Soft tissue removed from the anterior distal femur.  Lateral patellofemoral ligament was released.  Next, the  medial side was minimally exposed with partial removal of the fat pad.  The anterior horn of the lateral meniscus was released.  ACL was released.  Posterior neurovascular structures protected with a retractor.  Intramedullary alignment was then used to  make a cut on the tibia 9 mm off the least affected lateral tibial plateau.  A good cut was made, which was flat.   Bone quality was excellent.  Intramedullary alignment then used to make a cut in 5 degrees valgus on the distal femur.  An 8 mm resection  was made here.  With those resections in place, the patient had a little hyperextension with a 9 mm spacer, but very good stability and only 1-2 degrees of hyperextension with the 11 mm spacer.  The femur was sized to between a 2 and 3, but the tibia was  a 4 and so we chose the 3.  Anterior, posterior chamfer cuts were made.  The flexion gap at 90 degrees was also symmetric at 11 degrees.  Next, the tibia was keel punched.  Final preparation was made on both the femur and the tibia.  Trial components  were placed.  Patella cut down from 22 to 12 mm and a 32 3-peg trial patella was placed.  With these trial components in position, the patient achieved about 2 degrees of hyperextension, full flexion, no liftoff, excellent patellar tracking using no  thumbs technique.  Alignment looked intact.  Collaterals were stable to varus and valgus stress at 0, 30 and 90 degrees.  Trial components were removed.  Three L of irrigating solution used to irrigate out the knee joint.  A solution of Marcaine, Exparel  and saline injected into the capsule.  Next, tranexamic acid sponge was left to sit in the knee. This was allowed to sit for 3 minutes.  This was removed and then IrriSept solution, which had been used throughout the entire case after the incision and  the arthrotomy was then used as well during that 3-minute time period.  Next, the tibia was tapped into position with good alignment and good gripping of the implant.  Then, the femur was placed and the spacer was placed in the patella.  The same  stability parameters were maintained.  Tourniquet was released.  Three L of pouring irrigation then utilized.  Bleeding points encountered were controlled using electrocautery.  The knee was then closed over a bolster using #1 Vicryl suture, followed by  interrupted inverted 0 Vicryl  suture, 2-0 Vicryl suture and 3-0 Monocryl.  Steri-Strips and Aquacel dressing applied.  A solution of Marcaine, morphine, clonidine injected into the knee for postop pain relief.  The patient tolerated the procedure well  without immediate complications.  He was transferred to the recovery room in stable condition.  Bulky wrap and knee immobilizer placed.  Luke's assistance was required at all times during the case for retraction, mobilization of tissue, opening and  closing.  His assistance was a medical necessity.  VN/NUANCE  D:07/01/2019 T:07/01/2019 JOB:[id number]

## 2019-07-01 NOTE — Progress Notes (Signed)
Orthopedic Tech Progress Note Patient Details:  Marissa Roberts Nov 15, 1945 [id number]  CPM Left Knee CPM Left Knee: On Left Knee Flexion (Degrees): 10 Left Knee Extension (Degrees): 40 Additional Comments: added ice  Post Interventions Patient Tolerated: Well Instructions Provided: Care of device Ortho Devices Type of Ortho Device: CPM padding Ortho Device/Splint Interventions: Ordered   Post Interventions Patient Tolerated: Well Instructions Provided: Care of [place] 07/01/2019, 4:42 PM

## 2019-07-01 NOTE — H&P (Signed)
TOTAL KNEE ADMISSION H&P  Patient is being admitted for left total knee arthroplasty.  Subjective:  Chief Complaint:left knee pain.  HPI: Marissa Roberts, 74 y.o. female, has a history of pain and functional disability in the left knee due to arthritis and has failed non-surgical conservative treatments for greater than 12 weeks to includeNSAID's and/or analgesics, corticosteriod injections, flexibility and strengthening excercises and activity modification.  Onset of symptoms was abrupt, starting 1 years ago with rapidlly worsening course since that time. The patient noted no past surgery on the left knee(s).  Patient currently rates pain in the left knee(s) at 9 out of 10 with activity. Patient has night pain, worsening of pain with activity and weight bearing, pain that interferes with activities of daily living, pain with passive range of motion, crepitus and joint swelling.  Patient has evidence of subchondral sclerosis and joint space narrowing by imaging studies. This patient has had Meniscal root avulsion on the medial side which has accelerated her pre-existing arthritis.  Not a candidate for arthroscopic meniscal root repair due to pre-existing arthritis as well as multiple areas of arthritis in the knee joint.. There is no active infection.  Patient Active Problem List   Diagnosis Date Noted  . B12 deficiency 03/23/2019  . Anemia 02/05/2019  . Rheumatoid arthritis with rheumatoid factor of multiple sites without organ or systems involvement ([place]) 01/25/2019  . High risk medication use 01/25/2019  . Fatigue 09/18/2018  . Routine general medical examination at a health care facility 02/07/2015  . Morbid obesity ([place]) 04/19/2013  . Hyperlipidemia associated with type 2 diabetes mellitus ([place]) 07/28/2009  . Osteopenia 03/14/2009  . Diabetes mellitus type 2 with complications ([place]) 0000000  . Hypothyroidism 09/24/2006  . Essential hypertension 09/24/2006   Past Medical History:   Diagnosis Date  . Arthritis   . Bronchitis    hx of  . Diabetes mellitus   . Family history of adverse reaction to anesthesia    per patient, "daughter threw up after a knee surgery"  . GERD (gastroesophageal reflux disease)   . Hypercholesteremia   . Hypertension   . Hypothyroidism   . Pneumonia    hx of  . PONV (postoperative nausea and vomiting)   . Thyroid disease     Past Surgical History:  Procedure Laterality Date  . ABDOMINAL HYSTERECTOMY     partial  . BREAST SURGERY     lumpectomy left breast  . carpel tunnel    . COLONOSCOPY W/ POLYPECTOMY    . FOOT SURGERY    . knee arhtroscopy    . SHOULDER ARTHROSCOPY WITH SUBACROMIAL DECOMPRESSION, ROTATOR CUFF REPAIR AND BICEP TENDON REPAIR Right 05/26/2012   Procedure: Right Shoulder Diagnostic Operative Arthroscopy, Subacromial Decompression, Biceps Tenodesis, Mini Open Rotator Cuff Repair;  Surgeon: [name], MD;  Location: [facility];  Service: Orthopedics;  Laterality: Right;  Right Shoulder Diagnostic Operative Arthroscopy, Subacromial Decompression, Biceps Tenodesis, Mini Open Rotator Cuff Repair  . TONSILLECTOMY      Current Facility-Administered Medications  Medication Dose Route Frequency Provider Last Rate Last Admin  . chlorhexidine (HIBICLENS) 4 % liquid 4 application  60 mL Topical Once Magnant, Charles L, PA-C      . chlorhexidine (HIBICLENS) 4 % liquid 4 application  60 mL Topical Once Magnant, Charles L, PA-C      . fentaNYL (SUBLIMAZE) 100 MCG/2ML injection           . lactated ringers infusion   Intravenous Continuous Suzette Battiest, MD 10  mL/hr at 07/01/19 1043 New Bag at 07/01/19 1043  . midazolam (VERSED) 2 MG/2ML injection           . povidone-iodine 10 % swab 2 application  2 application Topical Once Magnant, Charles L, PA-C      . tranexamic acid (CYKLOKAPRON) 1000MG /139mL IVPB           . tranexamic acid (CYKLOKAPRON) IVPB 1,000 mg  1,000 mg Intravenous To OR Magnant, Charles L, PA-C      .  vancomycin (VANCOREADY) IVPB 1500 mg/300 mL  1,500 mg Intravenous On Call to OR [name], MD 150 mL/hr at 07/01/19 1123 1,500 mg at 07/01/19 1123   Allergies  Allergen Reactions  . Contrast Media [Iodinated Diagnostic Agents]     Throat swells from ct con tra  . Adhesive [Tape]     No paper tape, causes blisters  . Erythromycin Rash  . Penicillins Rash    Did it involve swelling of the face/tongue/throat, SOB, or low BP? No Did it involve sudden or severe rash/hives, skin peeling, or any reaction on the inside of your mouth or nose? No Did you need to seek medical attention at a hospital or doctor's office? No When did it last happen?Many years If all above answers are "NO", may proceed with cephalosporin use.     Social History   Tobacco Use  . Smoking status: Former Smoker    Quit date: 04/01/1990    Years since quitting: 29.2  . Smokeless tobacco: Never Used  Substance Use Topics  . Alcohol use: Not Currently    Comment: 06/22/2019: per patient about 20-25 years ago    Family History  Problem Relation Age of Onset  . Diabetes Mother   . Heart disease Mother   . Hyperlipidemia Mother   . Diabetes Father   . Diabetes Sister   . Hypertension Sister   . Heart disease Sister   . Dementia Sister   . Heart disease Brother   . Healthy Daughter   . Healthy Daughter   . Healthy Daughter   . Healthy Son      Review of Systems  Musculoskeletal: Positive for arthralgias.  All other systems reviewed and are negative.   Objective:  Physical Exam  Constitutional: She appears well-developed.  HENT:  Head: Normocephalic.  Eyes: Pupils are equal, round, and reactive to light.  Cardiovascular: Normal rate.  Respiratory: Effort normal.  Musculoskeletal:     Cervical back: Normal range of motion.  Neurological: She is alert.  Skin: Skin is warm.  Psychiatric: She has a normal mood and affect.  Orthopedic exam demonstrates medial joint line tenderness with  slight varus alignment.  Pedal pulses palpable.  Extensor mechanism is intact.  Mild effusion is present.  No groin pain with internal X rotation of the leg.  Patient has full extension and flexion to about 115.  Vital signs in last 24 hours: Temp:  [97.9 F (36.6 C)] 97.9 F (36.6 C) (04/01 1015) Pulse Rate:  [85] 85 (04/01 1015) Resp:  [20] 20 (04/01 1015) BP: (173)/(59) 173/59 (04/01 1015) SpO2:  [98 %] 98 % (04/01 1015) Weight:  [id number] kg] 108 kg (04/01 1015)  Labs:   Estimated body mass index is 39.62 kg/m as calculated from the following:   Height as of this encounter: 5\' 5"  (1.651 m).   Weight as of this encounter: 108 kg.   Imaging Review Plain radiographs demonstrate mild degenerative joint disease of the left knee(s). The overall  alignment ismild varus. The bone quality appears to be good for age and reported activity level.      Assessment/Plan:  End stage arthritis, left knee   The patient history, physical examination, clinical judgment of the provider and imaging studies are consistent with end stage degenerative joint disease of the left knee(s) and total knee arthroplasty is deemed medically necessary. The treatment options including medical management, injection therapy arthroscopy and arthroplasty were discussed at length. The risks and benefits of total knee arthroplasty were presented and reviewed. The risks due to aseptic loosening, infection, stiffness, patella tracking problems, thromboembolic complications and other imponderables were discussed. The patient acknowledged the explanation, agreed to proceed with the plan and consent was signed. Patient is being admitted for inpatient treatment for surgery, pain control, PT, OT, prophylactic antibiotics, VTE prophylaxis, progressive ambulation and ADL's and discharge planning. The patient is planning to be discharged home with home health services the patient had existing arthritis in the knee which was made worse  with meniscal root avulsion.  She reports continuing pain as well as stress reaction in the tibia by MRI scan.  Plan at this time is total knee replacement.  The risk benefits are discussed.  All questions answered    Anticipated LOS equal to or greater than 2 midnights due to - Age 33 and older with one or more of the following:  - Obesity  - Expected need for hospital services (PT, OT, Nursing) required for safe  discharge  - Anticipated need for postoperative skilled nursing care or inpatient rehab  - Active co-morbidities: None OR   - Unanticipated findings during/Post Surgery: None  - Patient is a high risk of re-admission due to: None

## 2019-07-01 NOTE — Brief Op Note (Signed)
   07/01/2019  3:59 PM  PATIENT:  [name] D Foutz  74 y.o. female  PRE-OPERATIVE DIAGNOSIS:  left knee osteoarthritis  POST-OPERATIVE DIAGNOSIS:  left knee osteoarthritis  PROCEDURE:  Procedure(s): LEFT TOTAL KNEE ARTHROPLASTY  SURGEON:  Surgeon(s): [name], MD  ASSISTANT: magnant pa  ANESTHESIA:   spinal  EBL: 75 ml    Total I/O In: 1000 [I.V.:1000] Out: -   BLOOD ADMINISTERED: none  DRAINS: none   LOCAL MEDICATIONS USED:  Marcaine mso4 clonidine  SPECIMEN:  No Specimen  COUNTS:  YES  TOURNIQUET:   Total Tourniquet Time Documented: Thigh (Left) - 68 minutes Total: Thigh (Left) - 68 minutes   DICTATION: .Other Dictation: Dictation Number [id number]  PLAN OF CARE: Admit for overnight observation  PATIENT DISPOSITION:  PACU - hemodynamically stable

## 2019-07-02 LAB — GLUCOSE, CAPILLARY
Glucose-Capillary: 196 mg/dL — ABNORMAL HIGH (ref 70–99)
Glucose-Capillary: 223 mg/dL — ABNORMAL HIGH (ref 70–99)
Glucose-Capillary: 126 mg/dL — ABNORMAL HIGH (ref 70–99)

## 2019-07-02 NOTE — Plan of Care (Signed)
  Problem: Education: Goal: Knowledge of General Education information will improve Description Including pain rating scale, medication(s)/side effects and non-pharmacologic comfort measures Outcome: Progressing   

## 2019-07-02 NOTE — Evaluation (Signed)
Physical Therapy Evaluation Patient Details Name: Marissa Roberts MRN: [id number] DOB: January 13, 1946 Today's Date: 07/02/2019   History of Present Illness  Pt is a 74 yo female presenting s/p L TKA on 4/1. PMH includes: DM II, hypothyroidism, osteopenia, rheumatoid arthritis, and morbid obesity.  Clinical Impression  Pt in bed upon arrival of PT, agreeable to evaluation at this time. Prior to admission the pt was independent with mobility and ADLs, using SPC for pain management only. The pt lives with her husband and has 3 steps to enter as well as multiple children in the area who are able and willing to provide assist/supervision as needed. The pt now presents with limitations in functional mobility, power, static/dynamic stability, and activity tolerance due to above dx and resulting pain, and will continue to benefit from skilled PT to address these deficits. The pt will continue to benefit from skilled PT to further progress functional endurance and tolerance for mobility as well as improved independence with mobility.     Follow Up Recommendations Follow surgeon's recommendation for DC plan and follow-up therapies;Home health PT;Supervision/Assistance - 24 hour    Equipment Recommendations  Rolling walker with 5" wheels    Recommendations for Other Services       Precautions / Restrictions Precautions Precautions: Fall Restrictions Weight Bearing Restrictions: Yes LLE Weight Bearing: Weight bearing as tolerated      Mobility  Bed Mobility Overal bed mobility: Needs Assistance Bed Mobility: Supine to Sit     Supine to sit: Mod assist     General bed mobility comments: modA for LLE movement, able to raise trunk without asssist  Transfers Overall transfer level: Needs assistance Equipment used: Rolling walker (2 wheeled) Transfers: Sit to/from Stand Sit to Stand: Mod assist         General transfer comment: modA for initial stand, will trial 5xSTS at later session, pt  with sig limitations in power up and stability upon stand  Ambulation/Gait Ambulation/Gait assistance: Min assist Gait Distance (Feet): 10 Feet(10 ft x2) Assistive device: Rolling walker (2 wheeled) Gait Pattern/deviations: Step-to pattern;Decreased stride length;Decreased stance time - left;Decreased weight shift to left;Trunk flexed   Gait velocity interpretation: <1.31 ft/sec, indicative of household ambulator General Gait Details: pt with sig wt bearing on BUE, poor wt acceptance on LLE, able to improve speed slightly with reps, but remains sig limited by pain  Stairs            Wheelchair Mobility    Modified Rankin (Stroke Patients Only)       Balance Overall balance assessment: Needs assistance Sitting-balance support: Bilateral upper extremity supported;Feet supported Sitting balance-Leahy Scale: Fair     Standing balance support: Bilateral upper extremity supported;During functional activity Standing balance-Leahy Scale: Poor Standing balance comment: bilateral UE support + minA to steady, pt able to stand statically and brush teeth while leaning against counter without UE support                             Pertinent Vitals/Pain Pain Assessment: 0-10 Pain Score: 6  Pain Descriptors / Indicators: Grimacing;Sore Pain Intervention(s): Limited activity within patient's tolerance;Monitored during session;Repositioned;Utilized relaxation techniques;Patient requesting pain meds-RN notified;Ice applied    Home Living Family/patient expects to be discharged to:: Private residence Living Arrangements: Spouse/significant other Available Help at Discharge: Family;Available 24 hours/day(multiple children in area free for a few days to provide 24/7 assist) Type of Home: House Home Access: Stairs to enter Entrance Stairs-Rails: Can  reach both Entrance Stairs-Number of Steps: 3 to porch + 1 into house Home Layout: One level Home Equipment: Cane - single  point;Bedside commode;Walker - 2 wheels Additional Comments: pt has been using cane reccently due to pain, has other equipment from husband's recent hip replacement (feb 2021)    Prior Function Level of Independence: Independent         Comments: previously independent without AD untul reccently when she had to start using cane due to pain     Hand Dominance   Dominant Hand: Right    Extremity/Trunk Assessment   Upper Extremity Assessment Upper Extremity Assessment: Overall WFL for tasks assessed    Lower Extremity Assessment Lower Extremity Assessment: LLE deficits/detail LLE Deficits / Details: pt with limitations in A/PROM of L knee due to pain. 3/5 knee ext and flex LLE: Unable to fully assess due to pain LLE Sensation: WNL    Cervical / Trunk Assessment Cervical / Trunk Assessment: Normal  Communication   Communication: No difficulties  Cognition Arousal/Alertness: Awake/alert Behavior During Therapy: WFL for tasks assessed/performed Overall Cognitive Status: Within Functional Limits for tasks assessed                                        General Comments General comments (skin integrity, edema, etc.): pt with 2 episodes of nausea during session    Exercises Total Joint Exercises Quad Sets: AROM;5 reps;Left;Supine;Seated Long Arc Quad: AROM;Left;5 reps Knee Flexion: AROM;Left;5 reps   Assessment/Plan    PT Assessment Patient needs continued PT services  PT Problem List Decreased strength;Decreased mobility;Decreased range of motion;Decreased coordination;Decreased activity tolerance;Decreased cognition;Decreased balance;Decreased knowledge of use of DME;Pain       PT Treatment Interventions DME instruction;Therapeutic exercise;Gait training;Balance training;Stair training;Functional mobility training;Neuromuscular re-education;Therapeutic activities;Patient/family education    PT Goals (Current goals can be found in the Care Plan  section)  Acute Rehab PT Goals Patient Stated Goal: return home with family PT Goal Formulation: With patient/family Time For Goal Achievement: 07/16/19 Potential to Achieve Goals: Good    Frequency 7X/week   Barriers to discharge        Co-evaluation               AM-PAC PT "6 Clicks" Mobility  Outcome Measure Help needed turning from your back to your side while in a flat bed without using bedrails?: A Little Help needed moving from lying on your back to sitting on the side of a flat bed without using bedrails?: A Little Help needed moving to and from a bed to a chair (including a wheelchair)?: A Little Help needed standing up from a chair using your arms (e.g., wheelchair or bedside chair)?: A Little Help needed to walk in hospital room?: A Little Help needed climbing 3-5 steps with a railing? : A Lot 6 Click Score: 17    End of Session Equipment Utilized During Treatment: Gait belt Activity Tolerance: Patient tolerated treatment well;Other (comment);Patient limited by pain(nausea) Patient left: in chair;with family/visitor present;with call bell/phone within reach Nurse Communication: Mobility status;Patient requests pain meds(pt request nausea meds) PT Visit Diagnosis: Difficulty in walking, not elsewhere classified (R26.2);Muscle weakness (generalized) (M62.81);Pain Pain - Right/Left: Left Pain - part of body: Knee;Leg    Time: [id number] PT Time Calculation (min) (ACUTE ONLY): 45 min   Charges:   PT Evaluation $PT Eval Moderate Complexity: 1 Mod PT Treatments $Gait Training: 8-22 mins $Therapeutic  Exercise: 8-22 mins        Marissa Roberts, PT, DPT   Acute Rehabilitation Department Pager #: [phone]  Otho Bellows 07/02/2019, 10:46 AM

## 2019-07-02 NOTE — Progress Notes (Signed)
  Subjective: Patient stable.  Having some pain in the left knee which is expected.  Had pain when she was ambulating to the bathroom.  She was able to tolerate CPM   Objective: Vital signs in last 24 hours: Temp:  [97.3 F (36.3 C)-97.9 F (36.6 C)] 97.5 F (36.4 C) (04/02 0752) Pulse Rate:  [74-90] 88 (04/02 0752) Resp:  [13-23] 18 (04/02 0752) BP: (109-173)/(45-137) 134/47 (04/02 0752) SpO2:  [95 %-100 %] 98 % (04/02 0752) Weight:  [id number] kg] 108 kg (04/01 1015)  Intake/Output from previous day: 04/01 0701 - 04/02 0700 In: 2192.4 [P.O.:480; I.V.:1712.4] Out: 2375 [Urine:2350; Blood:25] Intake/Output this shift: No intake/output data recorded.  Exam:  Dorsiflexion/Plantar flexion intact Compartment soft  Labs: No results for input(s): HGB in the last 72 hours. No results for input(s): WBC, RBC, HCT, PLT in the last 72 hours. No results for input(s): NA, K, CL, CO2, BUN, CREATININE, GLUCOSE, CALCIUM in the last 72 hours. No results for input(s): LABPT, INR in the last 72 hours.  Assessment/Plan: Plan at this time is continue with therapy today.  Continue with CPM.  Anticipate discharge tomorrow morning.   Landry Dyke Yuan Gann 07/02/2019, 8:56 AM

## 2019-07-02 NOTE — Progress Notes (Signed)
Physical Therapy Treatment Patient Details Name: Marissa Roberts MRN: [id number] DOB: 04-08-1945 Today's Date: 07/02/2019    History of Present Illness Pt is a 74 yo female presenting s/p L TKA on 4/1. PMH includes: DM II, hypothyroidism, osteopenia, rheumatoid arthritis, and morbid obesity.    PT Comments    Pt in bed upon arrival of PT, agreeable to PT session with focus on exercises this afternoon. The pt continues to present with limitations in functional strength and mobility due to pain from L TKA, but was able to demo slight improvements in bed mobility from session this morning. The pt was educated in technique and frequency/repetitions for exercise program and verbalized understanding. The pt will continue to benefit from skilled PT to complete stair training and progress functional mobility tolerance prior to d/c home.      Follow Up Recommendations  Follow surgeon's recommendation for DC plan and follow-up therapies;Home health PT;Supervision/Assistance - 24 hour     Equipment Recommendations  Rolling walker with 5" wheels    Recommendations for Other Services       Precautions / Restrictions Precautions Precautions: Fall;Knee Precaution Booklet Issued: Yes (comment) Restrictions Weight Bearing Restrictions: Yes LLE Weight Bearing: Weight bearing as tolerated    Mobility  Bed Mobility Overal bed mobility: Needs Assistance Bed Mobility: Supine to Sit     Supine to sit: Min assist     General bed mobility comments: minA for LLE movement, able to raise trunk without asssist  Transfers Overall transfer level: Needs assistance Equipment used: Rolling walker (2 wheeled) Transfers: Sit to/from Stand Sit to Stand: Mod assist         General transfer comment: modA for initial stand, pt with sig limitations in power up and stability upon stand, sig trunk flexion  Ambulation/Gait Ambulation/Gait assistance: Min assist Gait Distance (Feet): 3 Feet Assistive  device: Rolling walker (2 wheeled) Gait Pattern/deviations: Step-to pattern;Decreased stride length;Decreased stance time - left;Decreased weight shift to left;Trunk flexed   Gait velocity interpretation: <1.31 ft/sec, indicative of household ambulator General Gait Details: session focused on exercises, pt able to step laterally to reposition in bed using step-to, sig reliance on BUE   Stairs             Wheelchair Mobility    Modified Rankin (Stroke Patients Only)       Balance Overall balance assessment: Needs assistance Sitting-balance support: Bilateral upper extremity supported;Feet supported Sitting balance-Leahy Scale: Fair Sitting balance - Comments: supervision   Standing balance support: Bilateral upper extremity supported;During functional activity Standing balance-Leahy Scale: Poor Standing balance comment: bilateral UE support + minA to steady                            Cognition Arousal/Alertness: Awake/alert Behavior During Therapy: WFL for tasks assessed/performed Overall Cognitive Status: Within Functional Limits for tasks assessed                                        Exercises Total Joint Exercises Quad Sets: AROM;Left;Supine;10 reps Heel Slides: AAROM;Left;5 reps;Supine Hip ABduction/ADduction: AAROM;Left;5 reps;Supine Long Arc Quad: AROM;Left;10 reps;Seated Knee Flexion: AROM;Left;10 reps;Seated    General Comments        Pertinent Vitals/Pain Pain Assessment: 0-10 Pain Score: 8  Pain Descriptors / Indicators: Grimacing;Sore Pain Intervention(s): Limited activity within patient's tolerance;Monitored during session;Repositioned;RN gave pain meds during session;Ice applied;Utilized  relaxation techniques    Home Living                      Prior Function            PT Goals (current goals can now be found in the care plan section) Acute Rehab PT Goals Patient Stated Goal: return home with  family PT Goal Formulation: With patient/family Time For Goal Achievement: 07/16/19 Potential to Achieve Goals: Good Progress towards PT goals: Progressing toward goals    Frequency    7X/week      PT Plan      Co-evaluation              AM-PAC PT "6 Clicks" Mobility   Outcome Measure  Help needed turning from your back to your side while in a flat bed without using bedrails?: A Little Help needed moving from lying on your back to sitting on the side of a flat bed without using bedrails?: A Little Help needed moving to and from a bed to a chair (including a wheelchair)?: A Little Help needed standing up from a chair using your arms (e.g., wheelchair or bedside chair)?: A Little Help needed to walk in hospital room?: A Little Help needed climbing 3-5 steps with a railing? : A Lot 6 Click Score: 17    End of Session Equipment Utilized During Treatment: Gait belt Activity Tolerance: Patient tolerated treatment well;Other (comment);Patient limited by pain Patient left: in chair;with family/visitor present;with call bell/phone within reach Nurse Communication: Mobility status PT Visit Diagnosis: Difficulty in walking, not elsewhere classified (R26.2);Muscle weakness (generalized) (M62.81);Pain Pain - Right/Left: Left Pain - part of body: Knee;Leg     Time: [id number] PT Time Calculation (min) (ACUTE ONLY): 40 min  Charges:  $Gait Training: 8-22 mins $Therapeutic Exercise: 23-37 mins                     Karma Ganja, PT, DPT   Acute Rehabilitation Department Pager #: [phone]   Otho Bellows 07/02/2019, 3:58 PM

## 2019-07-02 NOTE — Plan of Care (Signed)
  Problem: Clinical Measurements: Goal: Will remain free from infection Outcome: Progressing   Problem: Activity: Goal: Risk for activity intolerance will decrease Outcome: Progressing   Problem: Nutrition: Goal: Adequate nutrition will be maintained Outcome: Progressing   Problem: Coping: Goal: Level of anxiety will decrease Outcome: Progressing   Problem: Elimination: Goal: Will not experience complications related to bowel motility Outcome: Progressing   Problem: Safety: Goal: Ability to remain free from injury will improve Outcome: Progressing   Problem: Skin Integrity: Goal: Risk for impaired skin integrity will decrease Outcome: Progressing   

## 2019-07-03 LAB — GLUCOSE, CAPILLARY
Glucose-Capillary: 173 mg/dL — ABNORMAL HIGH (ref 70–99)
Glucose-Capillary: 100 mg/dL — ABNORMAL HIGH (ref 70–99)
Glucose-Capillary: 134 mg/dL — ABNORMAL HIGH (ref 70–99)
Glucose-Capillary: 106 mg/dL — ABNORMAL HIGH (ref 70–99)

## 2019-07-03 NOTE — Progress Notes (Signed)
Physical Therapy Treatment Patient Details Name: Marissa Roberts MRN: [id number] DOB: [birth year] Today's Date: 07/03/2019    History of Present Illness Pt is a 74 yo female presenting s/p L TKA on 4/1. PMH includes: DM II, hypothyroidism, osteopenia, rheumatoid arthritis, and morbid obesity.    PT Comments    Pt is making progress with functional mobility. She was modA for all sit<>stand transfers throughout session, requiring frequent cues for optimal sequencing. She ambulated ~42ft to the restroom with min guard for safety. She was able to navigate 2 steps with bil rails with min guard for safety. She demonstrated and verbalized proper stair sequencing. Educated on importance of mobility and exercises. Pt would continue to benefit from skilled physical therapy services at this time while admitted and after d/c to address the below listed limitations in order to improve overall safety and independence with functional mobility.   Follow Up Recommendations  Home health PT;Supervision/Assistance - 24 hour     Equipment Recommendations  Rolling walker with 5" wheels    Recommendations for Other Services       Precautions / Restrictions Precautions Precautions: Fall;Knee Restrictions Weight Bearing Restrictions: Yes LLE Weight Bearing: Weight bearing as tolerated    Mobility  Bed Mobility Overal bed mobility: Needs Assistance Bed Mobility: Supine to Sit     Supine to sit: Min assist     General bed mobility comments: minA for L LE movement  Transfers Overall transfer level: Needs assistance Equipment used: Rolling walker (2 wheeled) Transfers: Sit to/from Stand Sit to Stand: Mod assist         General transfer comment: modA throughout session for STS transfers. Required cues for appropriate hand placement and safety with RW and to flex L LE for improved power up.   Ambulation/Gait Ambulation/Gait assistance: Min guard Gait Distance (Feet): 10 Feet Assistive device:  Rolling walker (2 wheeled) Gait Pattern/deviations: Step-through pattern;Decreased stride length;Decreased weight shift to left;Decreased stance time - left Gait velocity: dec   General Gait Details: min guard for safety   Stairs Stairs: Yes Stairs assistance: Min guard Stair Management: Two rails;Step to pattern;Forwards Number of Stairs: 2 General stair comments: min guard for safety. pt with heavy relianceon B UE support. No physical assist needed.   Wheelchair Mobility    Modified Rankin (Stroke Patients Only)       Balance Overall balance assessment: Needs assistance Sitting-balance support: No upper extremity supported;Feet supported Sitting balance-Leahy Scale: Good     Standing balance support: Bilateral upper extremity supported;During functional activity;No upper extremity supported Standing balance-Leahy Scale: Fair Standing balance comment: Able to stand at sink w/o UE support. B UE support during mobility, no LOB noted                            Cognition Arousal/Alertness: Awake/alert Behavior During Therapy: WFL for tasks assessed/performed Overall Cognitive Status: Within Functional Limits for tasks assessed                                        Exercises Total Joint Exercises Long Arc Quad: AROM;Strengthening;Left;5 reps Knee Flexion: AROM;Strengthening;5 reps;Seated Goniometric ROM: AROM: flex 65deg, lacking 25deg ext. AAROM: flex 72deg, lacking 12deg    General Comments General comments (skin integrity, edema, etc.): Pt with minor urinary incontinence- appears to be occasional urge and stress incontinence  Pertinent Vitals/Pain Pain Assessment: 0-10 Pain Score: 3  Pain Location: L knee Pain Descriptors / Indicators: Discomfort;Grimacing;Sore Pain Intervention(s): Monitored during session;Limited activity within patient's tolerance    Home Living                      Prior Function             PT Goals (current goals can now be found in the care plan section) Acute Rehab PT Goals PT Goal Formulation: With patient/family Time For Goal Achievement: 07/16/19 Potential to Achieve Goals: Good Progress towards PT goals: Progressing toward goals    Frequency    7X/week      PT Plan Current plan remains appropriate    Co-evaluation              AM-PAC PT "6 Clicks" Mobility   Outcome Measure  Help needed turning from your back to your side while in a flat bed without using bedrails?: None Help needed moving from lying on your back to sitting on the side of a flat bed without using bedrails?: A Little Help needed moving to and from a bed to a chair (including a wheelchair)?: A Little Help needed standing up from a chair using your arms (e.g., wheelchair or bedside chair)?: A Little Help needed to walk in hospital room?: A Little Help needed climbing 3-5 steps with a railing? : A Little 6 Click Score: 19    End of Session Equipment Utilized During Treatment: Gait belt Activity Tolerance: Patient tolerated treatment well Patient left: in bed;with call bell/phone within reach;with family/visitor present Nurse Communication: Mobility status PT Visit Diagnosis: Difficulty in walking, not elsewhere classified (R26.2);Muscle weakness (generalized) (M62.81);Pain Pain - Right/Left: Left Pain - part of body: Knee;Leg     Time: 1353-1420 PT Time Calculation (min) (ACUTE ONLY): 27 min  Charges:  $Gait Training: 8-22 mins                     Braelen Sproule, SPT Acute Rehab  [id number]  Jahad Old 07/03/2019, 3:56 PM

## 2019-07-03 NOTE — Progress Notes (Signed)
Received call from [place] with [facility] and she accepted the referral.

## 2019-07-03 NOTE — Care Management Note (Signed)
Case Management Note  Patient Details  Name: Marissa Roberts MRN: [id number] Date of Birth: 1945-12-03  Subjective/Objective: 74 yo F s/p L TKA.               Action/Plan: PT is recommending HHPT and a RW.   Expected Discharge Date:                  Expected Discharge Plan:     In-House Referral:     Discharge planning Services     Post Acute Care Choice:    Choice offered to:     DME Arranged:    DME Agency:     HH Arranged:   PT HH Agency:   [facility]  Status of Service:   Met with pt, husband and daughter. D/C plan is for pt to return home with the support of her husband and daughter. Husband reports that he recently had a hip replacement and he has a walker. Discussed preference for a [facility] agency. He prefers to use [facility]. He reports that he used [facility] after his his replacement and he liked them. Contacted Katina with [facility] for referral. She will discuss the referral with her manager and will f/u with CM.  If discussed at [place] of Stay Meetings, dates discussed:    Additional Comments:  Norina Buzzard, RN 07/03/2019, 2:28 PM

## 2019-07-03 NOTE — Progress Notes (Signed)
PT Progress Note for Charges    07/03/19 1600  PT Visit Information  Last PT Received On 07/03/19  PT General Charges  $$ ACUTE PT VISIT 1 Visit  PT Treatments  $Gait Training 23-37 mins  Anastasio Champion, DPT  Acute Rehabilitation Services Pager [phone] Office [phone]

## 2019-07-03 NOTE — Progress Notes (Signed)
  Subjective: Marissa Roberts is a 74 y.o. female s/p left TKA.  They are POD2.  Pt's pain is controlled.   Pt has ambulated with some difficulty.  She continues to progress slowly but steadily in PT. She notes walking this morning improved her pain.  She has not done stair training yet.  She has been urinating frequently but has not had a BM yet.    Objective: Vital signs in last 24 hours: Temp:  [98.2 F (36.8 C)-99.7 F (37.6 C)] 99 F (37.2 C) (04/03 0737) Pulse Rate:  [85-98] 98 (04/03 0737) Resp:  [15-18] 18 (04/03 0737) BP: (139-164)/(55-65) 145/62 (04/03 0737) SpO2:  [94 %-100 %] 94 % (04/03 0737)  Intake/Output from previous day: 04/02 0701 - 04/03 0700 In: 720 [P.O.:720] Out: -  Intake/Output this shift: Total I/O In: 240 [P.O.:240] Out: -   Exam:  No gross blood or drainage overlying the dressing 2+ DP pulse Sensation intact distally in the left foot Able to dorsiflex and plantarflex the left foot   Labs: No results for input(s): HGB in the last 72 hours. No results for input(s): WBC, RBC, HCT, PLT in the last 72 hours. No results for input(s): NA, K, CL, CO2, BUN, CREATININE, GLUCOSE, CALCIUM in the last 72 hours. No results for input(s): LABPT, INR in the last 72 hours.  Assessment/Plan: Pt is POD2 s/p left TKA.    -Plan to discharge to home likely tomorrow pending patient's pain and PT eval  -WBAT with a walker  -Okay to shower, dressing is waterproof.  Cautioned patient against soaking dressing in bath/pool/body of water  -Encouraged the use of the blue cradle boot to work on extension.  Cautioned patient against using a pillow under their knee.  -Use the CPM machine at least 3 times per day for one hour each time, increasing the degrees daily.  -she does have a CPM machine at home for postop ROM     Press [place] L Dontarius Sheley 07/03/2019, 10:43 AM

## 2019-07-03 NOTE — Progress Notes (Signed)
PT Progress Note for Charges    07/03/19 1200  PT Visit Information  Last PT Received On 07/03/19  PT General Charges  $$ ACUTE PT VISIT 1 Visit  PT Treatments  $Gait Training 8-22 mins  Anastasio Champion, DPT  Acute Rehabilitation Services Pager [phone] Office [phone]

## 2019-07-03 NOTE — Care Management Obs Status (Signed)
[facility] NOTIFICATION   Patient Details  Name: Marissa Roberts MRN: [id number] Date of Birth: 12-09-1945   Medicare Observation Status Notification Given: yes      Norina Buzzard, RN 07/03/2019, 1:33 PM

## 2019-07-03 NOTE — Progress Notes (Signed)
Physical Therapy Treatment Patient Details Name: Marissa Roberts MRN: [id number] DOB: 08/15/1945 Today's Date: 07/03/2019    History of Present Illness Pt is a 74 yo female presenting s/p L TKA on 4/1. PMH includes: DM II, hypothyroidism, osteopenia, rheumatoid arthritis, and morbid obesity.    PT Comments    Patient is making progress with functional mobility and activity tolerance. She was able to sit<>stand from chair and raised toilet with min guard for safety and demonstrated proper safety & sequencing with RW. She ambulated ~75' with RW and min guard for safety. Pt tolerated ambulation and exercises well. Plan to practice stairs next session. Pt would continue to benefit from skilled physical therapy services at this time while admitted and after d/c to address the below listed limitations in order to improve overall safety and independence with functional mobility.     Follow Up Recommendations  Home health PT;Supervision/Assistance - 24 hour     Equipment Recommendations  Rolling walker with 5" wheels    Recommendations for Other Services       Precautions / Restrictions Precautions Precautions: Fall;Knee Restrictions Weight Bearing Restrictions: Yes LLE Weight Bearing: Weight bearing as tolerated    Mobility  Bed Mobility               General bed mobility comments: OOB in chair upon arrival  Transfers Overall transfer level: Needs assistance Equipment used: Rolling walker (2 wheeled) Transfers: Sit to/from Stand Sit to Stand: Min guard         General transfer comment: min guard for safety. Pt w/proper sequuencing and safety with RW. Ablt to STS from chair and raised toilet w/o physical assist  Ambulation/Gait Ambulation/Gait assistance: Min guard Gait Distance (Feet): 75 Feet Assistive device: Rolling walker (2 wheeled) Gait Pattern/deviations: Step-through pattern;Decreased stride length;Decreased weight shift to left;Decreased stance time -  left Gait velocity: dec   General Gait Details: pt w/heavy reliance on B UE support. Min guard for safety, no physical assist required.    Stairs             Wheelchair Mobility    Modified Rankin (Stroke Patients Only)       Balance Overall balance assessment: Needs assistance Sitting-balance support: No upper extremity supported;Feet supported Sitting balance-Leahy Scale: Fair     Standing balance support: Bilateral upper extremity supported;During functional activity;No upper extremity supported Standing balance-Leahy Scale: Fair Standing balance comment: Able to stand at sink w/o UE support. B UE support during mobility, no LOB noted                            Cognition Arousal/Alertness: Awake/alert Behavior During Therapy: WFL for tasks assessed/performed Overall Cognitive Status: Within Functional Limits for tasks assessed                                        Exercises Total Joint Exercises Long Arc Quad: AROM;Left;5 reps;Seated Knee Flexion: AROM;Left;10 reps;Seated    General Comments        Pertinent Vitals/Pain Pain Assessment: Faces Faces Pain Scale: Hurts little more Pain Location: L knee Pain Descriptors / Indicators: Discomfort;Grimacing;Sore Pain Intervention(s): Monitored during session;Repositioned;Limited activity within patient's tolerance    Home Living                      Prior Function  PT Goals (current goals can now be found in the care plan section) Acute Rehab PT Goals PT Goal Formulation: With patient/family Time For Goal Achievement: 07/16/19 Potential to Achieve Goals: Good Progress towards PT goals: Progressing toward goals    Frequency    7X/week      PT Plan Current plan remains appropriate    Co-evaluation              AM-PAC PT "6 Clicks" Mobility   Outcome Measure  Help needed turning from your back to your side while in a flat bed without using  bedrails?: A Little Help needed moving from lying on your back to sitting on the side of a flat bed without using bedrails?: A Little Help needed moving to and from a bed to a chair (including a wheelchair)?: A Little Help needed standing up from a chair using your arms (e.g., wheelchair or bedside chair)?: None Help needed to walk in hospital room?: A Little Help needed climbing 3-5 steps with a railing? : A Lot 6 Click Score: 18    End of Session Equipment Utilized During Treatment: Gait belt Activity Tolerance: Patient tolerated treatment well Patient left: in chair;with call bell/phone within reach;with chair alarm set;with family/visitor present Nurse Communication: Mobility status PT Visit Diagnosis: Difficulty in walking, not elsewhere classified (R26.2);Muscle weakness (generalized) (M62.81);Pain Pain - Right/Left: Left Pain - part of body: Knee;Leg     Time: [id number] PT Time Calculation (min) (ACUTE ONLY): 17 min  Charges:                        Dara Hoyer, SPT Acute Rehab  [id number]    Payal Stanforth 07/03/2019, 10:10 AM

## 2019-07-04 DIAGNOSIS — Z91041 Radiographic dye allergy status: Secondary | ICD-10-CM | POA: Diagnosis not present

## 2019-07-04 DIAGNOSIS — M1712 Unilateral primary osteoarthritis, left knee: Secondary | ICD-10-CM | POA: Diagnosis present

## 2019-07-04 DIAGNOSIS — Z880 Allergy status to penicillin: Secondary | ICD-10-CM | POA: Diagnosis not present

## 2019-07-04 DIAGNOSIS — K219 Gastro-esophageal reflux disease without esophagitis: Secondary | ICD-10-CM | POA: Diagnosis present

## 2019-07-04 DIAGNOSIS — E785 Hyperlipidemia, unspecified: Secondary | ICD-10-CM | POA: Diagnosis present

## 2019-07-04 DIAGNOSIS — M8580 Other specified disorders of bone density and structure, unspecified site: Secondary | ICD-10-CM | POA: Diagnosis present

## 2019-07-04 DIAGNOSIS — M0579 Rheumatoid arthritis with rheumatoid factor of multiple sites without organ or systems involvement: Secondary | ICD-10-CM | POA: Diagnosis present

## 2019-07-04 DIAGNOSIS — E1169 Type 2 diabetes mellitus with other specified complication: Secondary | ICD-10-CM | POA: Diagnosis present

## 2019-07-04 DIAGNOSIS — Z87891 Personal history of nicotine dependence: Secondary | ICD-10-CM | POA: Diagnosis not present

## 2019-07-04 DIAGNOSIS — Z833 Family history of diabetes mellitus: Secondary | ICD-10-CM | POA: Diagnosis not present

## 2019-07-04 DIAGNOSIS — E538 Deficiency of other specified B group vitamins: Secondary | ICD-10-CM | POA: Diagnosis present

## 2019-07-04 DIAGNOSIS — Z8249 Family history of ischemic heart disease and other diseases of the circulatory system: Secondary | ICD-10-CM | POA: Diagnosis not present

## 2019-07-04 DIAGNOSIS — Z888 Allergy status to other drugs, medicaments and biological substances status: Secondary | ICD-10-CM | POA: Diagnosis not present

## 2019-07-04 DIAGNOSIS — Z90710 Acquired absence of both cervix and uterus: Secondary | ICD-10-CM | POA: Diagnosis not present

## 2019-07-04 DIAGNOSIS — Z79899 Other long term (current) drug therapy: Secondary | ICD-10-CM | POA: Diagnosis not present

## 2019-07-04 DIAGNOSIS — Z6839 Body mass index (BMI) 39.0-39.9, adult: Secondary | ICD-10-CM | POA: Diagnosis not present

## 2019-07-04 DIAGNOSIS — I10 Essential (primary) hypertension: Secondary | ICD-10-CM | POA: Diagnosis present

## 2019-07-04 DIAGNOSIS — E039 Hypothyroidism, unspecified: Secondary | ICD-10-CM | POA: Diagnosis present

## 2019-07-04 DIAGNOSIS — Z83438 Family history of other disorder of lipoprotein metabolism and other lipidemia: Secondary | ICD-10-CM | POA: Diagnosis not present

## 2019-07-04 DIAGNOSIS — E7800 Pure hypercholesterolemia, unspecified: Secondary | ICD-10-CM | POA: Diagnosis present

## 2019-07-04 LAB — GLUCOSE, CAPILLARY
Glucose-Capillary: 126 mg/dL — ABNORMAL HIGH (ref 70–99)
Glucose-Capillary: 109 mg/dL — ABNORMAL HIGH (ref 70–99)

## 2019-07-04 MED ORDER — GABAPENTIN 300 MG PO CAPS: 300.0000 mg | ORAL_CAPSULE | Freq: Three times a day (TID) | ORAL | 0 refills | Status: DC

## 2019-07-04 MED ORDER — OXYCODONE HCL 5 MG PO TABS: 5.0000 mg | ORAL_TABLET | ORAL | 0 refills | Status: DC | PRN

## 2019-07-04 MED ORDER — ASPIRIN 81 MG PO CHEW: 81.0000 mg | CHEWABLE_TABLET | Freq: Two times a day (BID) | ORAL | 0 refills | Status: DC

## 2019-07-04 NOTE — Progress Notes (Signed)
Pt is ambulating with the walker to the bathroom. Left knee incision with Aquacel dressing dry and intact. Discharge instructions was given to pt, verbalized understanding. Discharged to home accompanied by daughter.

## 2019-07-04 NOTE — Progress Notes (Signed)
Physical Therapy Treatment Patient Details Name: Marissa Roberts MRN: [id number] DOB: [birth year] Today's Date: 07/04/2019    History of Present Illness Pt is a 74 yo female presenting s/p L TKA on 4/1. PMH includes: DM II, hypothyroidism, osteopenia, rheumatoid arthritis, and morbid obesity.    PT Comments    Pt demonstrated steady progress towards therapy goals. She demonstrates improved sit<>stand transitions minG-supervision (no LOB noted), ambulated 271f today without standing/seated rest breaks, and was able to perform improved SLR with assistance required for >1in lift from surface today. Knee ROM improved with extension improving 7 degrees with not AAROM & AROM and is lacking 4 degrees from full extension using zero knee foam. Pt returned to bed with ice in place and zero knee being utilized with needs in reach. Pt has met all goals required to safely return home at this point, but will continue following while admitted acutely to continue progression with strength and functional mobility.   Follow Up Recommendations  Home health PT;Supervision/Assistance - 24 hour;Follow surgeon's recommendation for DC plan and follow-up therapies     Equipment Recommendations  Rolling walker with 5" wheels    Recommendations for Other Services       Precautions / Restrictions Precautions Precautions: Fall;Knee Precaution Booklet Issued: Yes (comment) Restrictions Weight Bearing Restrictions: Yes LLE Weight Bearing: Weight bearing as tolerated    Mobility  Bed Mobility Overal bed mobility: Needs Assistance Bed Mobility: Sit to Supine       Sit to supine: Min assist   General bed mobility comments: minA for L LE movement  Transfers Overall transfer level: Needs assistance Equipment used: Rolling walker (2 wheeled) Transfers: Sit to/from Stand Sit to Stand: Min guard         General transfer comment: minG-supervision throughout session for STS transfers with no LOB indicated  upon initial stance. No VC required today for sequencing.  Ambulation/Gait Ambulation/Gait assistance: Min guard;Supervision Gait Distance (Feet): 200 Feet Assistive device: Rolling walker (2 wheeled) Gait Pattern/deviations: Step-through pattern;Decreased stride length;Antalgic;Decreased weight shift to left Gait velocity: dec Gait velocity interpretation: 1.31 - 2.62 ft/sec, indicative of limited community ambulator General Gait Details: MinG-supervision for safety, requiring VCs for sequencing to improve reciprocal gait pattern, continues to require UE offloading during L stance leading to non-reciprocal gait.      Balance Overall balance assessment: Needs assistance Sitting-balance support: No upper extremity supported;Feet supported Sitting balance-Leahy Scale: Good Sitting balance - Comments: supervision   Standing balance support: Bilateral upper extremity supported;During functional activity;No upper extremity supported Standing balance-Leahy Scale: Fair Standing balance comment: Able to stand without UE support, no LOB throughout session with dynamic gait/balance      Cognition Arousal/Alertness: Awake/alert Behavior During Therapy: WFL for tasks assessed/performed Overall Cognitive Status: Within Functional Limits for tasks assessed          Exercises Total Joint Exercises Ankle Circles/Pumps: AROM;Both;10 reps Quad Sets: AROM;Left;20 reps Short Arc Quad: AROM;Left;20 reps Straight Leg Raises: AROM;Left;10 reps Knee Flexion: AAROM;Left;10 reps Goniometric ROM: AROM - 18-78, AAROM 4-87 degrees    General Comments General comments (skin integrity, edema, etc.): VSS, no urinary incontience noted today.      Pertinent Vitals/Pain Pain Assessment: Faces Faces Pain Scale: Hurts little more Pain Location: L knee Pain Descriptors / Indicators: Discomfort;Grimacing;Sore Pain Intervention(s): Limited activity within patient's tolerance;Monitored during  session;Repositioned           PT Goals (current goals can now be found in the care plan section) Acute Rehab PT Goals Patient  Stated Goal: return home with family PT Goal Formulation: With patient/family Time For Goal Achievement: 07/16/19 Potential to Achieve Goals: Good Progress towards PT goals: Progressing toward goals    Frequency    7X/week      PT Plan Current plan remains appropriate       AM-PAC PT "6 Clicks" Mobility   Outcome Measure  Help needed turning from your back to your side while in a flat bed without using bedrails?: None Help needed moving from lying on your back to sitting on the side of a flat bed without using bedrails?: A Little Help needed moving to and from a bed to a chair (including a wheelchair)?: A Little Help needed standing up from a chair using your arms (e.g., wheelchair or bedside chair)?: A Little Help needed to walk in hospital room?: A Little Help needed climbing 3-5 steps with a railing? : A Little 6 Click Score: 19    End of Session Equipment Utilized During Treatment: Gait belt Activity Tolerance: Patient tolerated treatment well Patient left: in bed;with call bell/phone within reach;with family/visitor present Nurse Communication: Mobility status PT Visit Diagnosis: Difficulty in walking, not elsewhere classified (R26.2);Muscle weakness (generalized) (M62.81);Pain Pain - Right/Left: Left Pain - part of body: Knee;Leg     Time: 5176-1607 PT Time Calculation (min) (ACUTE ONLY): 35 min  Charges:  $Gait Training: 8-22 mins $Therapeutic Exercise: 8-22 mins                     Ann Held PT, DPT Acute Rehab [facility] P: [phone]    Keyah Blizard A Johnsie Moscoso 07/04/2019, 9:43 AM

## 2019-07-04 NOTE — Care Management (Signed)
Patient states that she has RW at home, she states that she was assigned to [facility] from the ortho office and they have been in contact with her. I will notify liaison that patient will DC today. No other CM needs.

## 2019-07-04 NOTE — Progress Notes (Signed)
  Subjective: Marissa Roberts is a 74 y.o. female s/p left TKA.  They are POD 3.  Pt's pain is controlled.  Pt denies numbness/tingling/weakness.  Pt has ambulated with some difficulty.  She has progressed in physical therapy and feels comfortable with discharge today.   Objective: Vital signs in last 24 hours: Temp:  [98.7 F (37.1 C)-99.6 F (37.6 C)] 99 F (37.2 C) (04/04 0735) Pulse Rate:  [90-99] 90 (04/04 0735) Resp:  [18] 18 (04/04 0735) BP: (122-132)/(46-57) 132/46 (04/04 0735) SpO2:  [94 %-97 %] 95 % (04/04 0735)  Intake/Output from previous day: 04/03 0701 - 04/04 0700 In: 240 [P.O.:240] Out: -  Intake/Output this shift: No intake/output data recorded.  Exam:  No gross blood or drainage overlying the dressing 2+ DP pulse Sensation intact distally in the left foot Able to dorsiflex and plantarflex the left foot   Labs: No results for input(s): HGB in the last 72 hours. No results for input(s): WBC, RBC, HCT, PLT in the last 72 hours. No results for input(s): NA, K, CL, CO2, BUN, CREATININE, GLUCOSE, CALCIUM in the last 72 hours. No results for input(s): LABPT, INR in the last 72 hours.  Assessment/Plan: Pt is POD 3 s/p left TKA.    -Plan to discharge to home today pending patient's pain and PT eval  -WBAT with a walker  -Okay to shower, dressing is waterproof.  Cautioned patient against soaking dressing in bath/pool/body of water  -Encouraged the use of the blue cradle boot to work on extension.  Cautioned patient against using a pillow under their knee.  -Use the CPM machine at least 3 times per day for one hour each time, increasing the degrees daily.     [name] [name] 07/04/2019, 10:52 AM

## 2019-07-05 ENCOUNTER — Encounter: Payer: Self-pay | Admitting: *Deleted

## 2019-07-06 DIAGNOSIS — Z471 Aftercare following joint replacement surgery: Secondary | ICD-10-CM | POA: Diagnosis not present

## 2019-07-06 DIAGNOSIS — I10 Essential (primary) hypertension: Secondary | ICD-10-CM | POA: Diagnosis not present

## 2019-07-06 DIAGNOSIS — M0579 Rheumatoid arthritis with rheumatoid factor of multiple sites without organ or systems involvement: Secondary | ICD-10-CM | POA: Diagnosis not present

## 2019-07-06 DIAGNOSIS — Z7984 Long term (current) use of oral hypoglycemic drugs: Secondary | ICD-10-CM | POA: Diagnosis not present

## 2019-07-06 DIAGNOSIS — E785 Hyperlipidemia, unspecified: Secondary | ICD-10-CM | POA: Diagnosis not present

## 2019-07-06 DIAGNOSIS — K219 Gastro-esophageal reflux disease without esophagitis: Secondary | ICD-10-CM | POA: Diagnosis not present

## 2019-07-06 DIAGNOSIS — Z87891 Personal history of nicotine dependence: Secondary | ICD-10-CM | POA: Diagnosis not present

## 2019-07-06 DIAGNOSIS — Z96652 Presence of left artificial knee joint: Secondary | ICD-10-CM | POA: Diagnosis not present

## 2019-07-06 DIAGNOSIS — E119 Type 2 diabetes mellitus without complications: Secondary | ICD-10-CM | POA: Diagnosis not present

## 2019-07-06 DIAGNOSIS — E039 Hypothyroidism, unspecified: Secondary | ICD-10-CM | POA: Diagnosis not present

## 2019-07-06 DIAGNOSIS — M8580 Other specified disorders of bone density and structure, unspecified site: Secondary | ICD-10-CM | POA: Diagnosis not present

## 2019-07-08 NOTE — Progress Notes (Deleted)
Office Visit Note  Patient: Marissa Roberts             Date of Birth: 07-Sep-1945           MRN: [id number]             PCP: [name], MD Referring: [name], * Visit Date: 07/13/2019 Occupation: @GUAROCC @  Subjective:  No chief complaint on file.   History of Present Illness: [place] is a 74 y.o. female ***   Activities of Daily Living:  Patient reports morning stiffness for *** {minute/hour:19697}.   Patient {ACTIONS;DENIES/REPORTS:21021675::"Denies"} nocturnal pain.  Difficulty dressing/grooming: {ACTIONS;DENIES/REPORTS:21021675::"Denies"} Difficulty climbing stairs: {ACTIONS;DENIES/REPORTS:21021675::"Denies"} Difficulty getting out of chair: {ACTIONS;DENIES/REPORTS:21021675::"Denies"} Difficulty using hands for taps, buttons, cutlery, and/or writing: {ACTIONS;DENIES/REPORTS:21021675::"Denies"}  No Rheumatology ROS completed.   PMFS History:  Patient Active Problem List   Diagnosis Date Noted  . Arthritis of left knee 07/01/2019  . B12 deficiency 03/23/2019  . Anemia 02/05/2019  . Rheumatoid arthritis with rheumatoid factor of multiple sites without organ or systems involvement ([place]) 01/25/2019  . High risk medication use 01/25/2019  . Fatigue 09/18/2018  . Routine general medical examination at a health care facility 02/07/2015  . Morbid obesity ([place]) 04/19/2013  . Hyperlipidemia associated with type 2 diabetes mellitus ([place]) 07/28/2009  . Osteopenia 03/14/2009  . Diabetes mellitus type 2 with complications ([place]) 0000000  . Hypothyroidism 09/24/2006  . Essential hypertension 09/24/2006    Past Medical History:  Diagnosis Date  . Arthritis   . Bronchitis    hx of  . Diabetes mellitus   . Family history of adverse reaction to anesthesia    per patient, "daughter threw up after a knee surgery"  . GERD (gastroesophageal reflux disease)   . Hypercholesteremia   . Hypertension   . Hypothyroidism   . Pneumonia    hx of    . PONV (postoperative nausea and vomiting)   . Thyroid disease     Family History  Problem Relation Age of Onset  . Diabetes Mother   . Heart disease Mother   . Hyperlipidemia Mother   . Diabetes Father   . Diabetes Sister   . Hypertension Sister   . Heart disease Sister   . Dementia Sister   . Heart disease Brother   . Healthy Daughter   . Healthy Daughter   . Healthy Daughter   . Healthy Son    Past Surgical History:  Procedure Laterality Date  . ABDOMINAL HYSTERECTOMY     partial  . BREAST SURGERY     lumpectomy left breast  . carpel tunnel    . COLONOSCOPY W/ POLYPECTOMY    . FOOT SURGERY    . knee arhtroscopy    . SHOULDER ARTHROSCOPY WITH SUBACROMIAL DECOMPRESSION, ROTATOR CUFF REPAIR AND BICEP TENDON REPAIR Right 05/26/2012   Procedure: Right Shoulder Diagnostic Operative Arthroscopy, Subacromial Decompression, Biceps Tenodesis, Mini Open Rotator Cuff Repair;  Surgeon: [name], MD;  Location: [place];  Service: Orthopedics;  Laterality: Right;  Right Shoulder Diagnostic Operative Arthroscopy, Subacromial Decompression, Biceps Tenodesis, Mini Open Rotator Cuff Repair  . TONSILLECTOMY    . TOTAL KNEE ARTHROPLASTY Left 07/01/2019   Procedure: LEFT TOTAL KNEE ARTHROPLASTY;  Surgeon: [name], MD;  Location: [place];  Service: Orthopedics;  Laterality: Left;   Social History   Social History Narrative  . Not on file   Immunization History  Administered Date(s) Administered  . Fluad Quad(high Dose 65+) 01/02/2019  . Influenza Split  01/03/2011  . Influenza Whole 04/01/2005, 12/18/2009  . Influenza, High Dose Seasonal PF 01/07/2014, 01/27/2015, 01/02/2016, 01/13/2017, 01/16/2018  . Influenza-Unspecified 11/30/2012  . PFIZER SARS-COV-2 Vaccination 05/28/2019, 06/23/2019  . Pneumococcal Conjugate-13 09/12/2016, 01/02/2019  . Pneumococcal Polysaccharide-23 07/15/2014  . Td 04/01/2001, 10/07/2013     Objective: Vital Signs: There were no vitals taken  for this visit.   Physical Exam   Musculoskeletal Exam: ***  CDAI Exam: CDAI Score: -- Patient Global: --; Provider Global: -- Swollen: --; Tender: -- Joint Exam 07/13/2019   No joint exam has been documented for this visit   There is currently no information documented on the homunculus. Go to the Rheumatology activity and complete the homunculus joint exam.  Investigation: No additional findings.  Imaging: MR Knee Left w/o contrast  Result Date: 06/09/2019 CLINICAL DATA:  Knee pain for 3 weeks. EXAM: MRI OF THE LEFT KNEE WITHOUT CONTRAST TECHNIQUE: Multiplanar, multisequence MR imaging of the knee was performed. No intravenous contrast was administered. COMPARISON:  Radiographs 05/07/2019 FINDINGS: MENISCI Medial meniscus: There is a large radial tear involving the posterior horn of the medial meniscus measuring approximately 8 mm. There is associated medial protrusion of the meniscus of 4 mm. The meniscus demonstrates increased T2 signal intensity consistent with associated mucinous degeneration. Lateral meniscus:  Intact LIGAMENTS Cruciates:  Intact Collaterals:  Intact. MCL and pes anserine bursitis noted. CARTILAGE Patellofemoral: Advanced degenerative chondrosis with areas of full-thickness cartilage loss, joint space narrowing and spurring. Medial: Advanced degenerative chondrosis with areas of full-thickness cartilage loss. There is joint space narrowing and spurring. Lateral:  Moderate degenerative chondrosis with early spurring. Joint:  Moderate-sized joint effusion and mild synovitis. Popliteal Fossa:  Small to moderate-sized Baker's cyst. Extensor Mechanism: The patella retinacular structures are intact and the quadriceps and patellar tendons are intact. Bones: Marked marrow edema involving the medial femoral condyle with a small subchondral stress fracture noted. There is also reactive marrow edema in the medial tibia. Other: Normal knee musculature. IMPRESSION: 1. Large radial  tear involving the posterior horn of the medial meniscus with associated medial protrusion of the dysfunctional meniscus. 2. Small subchondral stress fracture involving the medial femoral condyle with significant surrounding marrow edema. There is also a moderate stress reaction in the tibia. 3. Intact ligamentous structures. 4. Tricompartmental degenerative changes. 5. Moderate-sized joint effusion and small to moderate-sized Baker's cyst. Electronically Signed   By: Marijo Sanes M.D.   On: 06/09/2019 10:09    Recent Labs: Lab Results  Component Value Date   WBC 12.3 (H) 06/22/2019   HGB 11.0 (L) 06/22/2019   PLT 378 06/22/2019   NA 138 06/22/2019   K 3.8 06/22/2019   CL 102 06/22/2019   CO2 25 06/22/2019   GLUCOSE 95 06/22/2019   BUN 12 06/22/2019   CREATININE 0.77 06/22/2019   BILITOT 0.3 05/25/2019   ALKPHOS 41 03/23/2019   AST 16 05/25/2019   ALT 14 05/25/2019   PROT 6.5 05/25/2019   ALBUMIN 4.1 03/23/2019   CALCIUM 9.7 06/22/2019   GFRAA >60 06/22/2019   QFTBGOLDPLUS NEGATIVE 12/24/2018    Speciality Comments: PLQ Eye Exam: 12/15/18 WNL @ [facility] follow up in 6 months  Procedures:  No procedures performed Allergies: Contrast media [iodinated diagnostic agents], Adhesive [tape], Erythromycin, and Penicillins   Assessment / Plan:     Visit Diagnoses: No diagnosis found.  Orders: No orders of the defined types were placed in this encounter.  No orders of the defined types were placed in this  encounter.   Face-to-face time spent with patient was *** minutes. Greater than 50% of time was spent in counseling and coordination of care.  Follow-Up Instructions: No follow-ups on file.   Ofilia Neas, PA-C  Note - This record has been created using Dragon software.  Chart creation errors have been sought, but may not always  have been located. Such creation errors do not reflect on  the standard of medical care.

## 2019-07-13 ENCOUNTER — Telehealth: Payer: Self-pay | Admitting: Surgical

## 2019-07-13 ENCOUNTER — Ambulatory Visit: Payer: BC Managed Care – PPO | Admitting: Rheumatology

## 2019-07-13 NOTE — Telephone Encounter (Signed)
Patient called asked if she can take a shower? The number to contact patient is [phone]

## 2019-07-13 NOTE — Telephone Encounter (Signed)
IC sw patient and advised ok for shower.

## 2019-07-14 NOTE — Discharge Summary (Signed)
Physician Discharge Summary      Patient ID: Marissa Roberts MRN: [id number] DOB/AGE: [birth year] 72 y.o.  Admit date: 07/01/2019 Discharge date: 07/04/2019  Admission Diagnoses:  Active Problems:   Arthritis of left knee   Discharge Diagnoses:  Same  Surgeries: Procedure(s): LEFT TOTAL KNEE ARTHROPLASTY on 07/01/2019   Consultants:   Discharged Condition: Stable  Hospital Course: Marissa Roberts is an 74 y.o. female who was admitted 07/01/2019 with a chief complaint of left knee pain, and found to have a diagnosis of left knee arthritis.  They were brought to the operating room on 07/01/2019 and underwent the above named procedures.  Pt awoke from anesthesia without complication and was transferred to the floor. On POD1, patient's pain was controlled but she had difficulty with ambulation and her physical therapy sessions.  This improved over the course of the next several days to the point where she was able to mobilize much better by postop day 3.  She was subsequently discharged home on postop day 3..  Pt will f/u with Dr. Marlou Sa in clinic in ~2 weeks.   Antibiotics given:  Anti-infectives (From admission, onward)   Start     Dose/Rate Route Frequency Ordered Stop   07/01/19 2300  vancomycin (VANCOCIN) IVPB 1000 mg/200 mL premix     1,000 mg 200 mL/hr over 60 Minutes Intravenous Every 12 hours 07/01/19 1811 07/02/19 0022   07/01/19 2200  hydroxychloroquine (PLAQUENIL) tablet 200 mg  Status:  Discontinued     200 mg Oral 2 times daily 07/01/19 1613 07/04/19 2033   07/01/19 0600  vancomycin (VANCOREADY) IVPB 1500 mg/300 mL     1,500 mg 150 mL/hr over 120 Minutes Intravenous On call to O.R. 06/30/19 0701 07/01/19 1821    .  Recent vital signs:  Vitals:   07/04/19 0354 07/04/19 0735  BP: (!) 124/47 (!) 132/46  Pulse: 92 90  Resp:  18  Temp: 99.6 F (37.6 C) 99 F (37.2 C)  SpO2: 94% 95%    Recent laboratory studies:  Results for orders placed or performed during the  hospital encounter of 07/01/19  Glucose, capillary  Result Value Ref Range   Glucose-Capillary 65 (L) 70 - 99 mg/dL  Glucose, capillary  Result Value Ref Range   Glucose-Capillary 95 70 - 99 mg/dL  Glucose, capillary  Result Value Ref Range   Glucose-Capillary 57 (L) 70 - 99 mg/dL   Comment 1 Notify RN    Comment 2 Document in Chart   Glucose, capillary  Result Value Ref Range   Glucose-Capillary 107 (H) 70 - 99 mg/dL   Comment 1 Notify RN    Comment 2 Document in Chart   Glucose, capillary  Result Value Ref Range   Glucose-Capillary 65 (L) 70 - 99 mg/dL  Glucose, capillary  Result Value Ref Range   Glucose-Capillary 66 (L) 70 - 99 mg/dL  Glucose, capillary  Result Value Ref Range   Glucose-Capillary 85 70 - 99 mg/dL  Glucose, capillary  Result Value Ref Range   Glucose-Capillary 185 (H) 70 - 99 mg/dL  Glucose, capillary  Result Value Ref Range   Glucose-Capillary 196 (H) 70 - 99 mg/dL  Glucose, capillary  Result Value Ref Range   Glucose-Capillary 223 (H) 70 - 99 mg/dL  Glucose, capillary  Result Value Ref Range   Glucose-Capillary 126 (H) 70 - 99 mg/dL  Glucose, capillary  Result Value Ref Range   Glucose-Capillary 173 (H) 70 - 99 mg/dL  Glucose, capillary  Result  Value Ref Range   Glucose-Capillary 100 (H) 70 - 99 mg/dL  Glucose, capillary  Result Value Ref Range   Glucose-Capillary 134 (H) 70 - 99 mg/dL  Glucose, capillary  Result Value Ref Range   Glucose-Capillary 106 (H) 70 - 99 mg/dL   Comment 1 Document in Chart   Glucose, capillary  Result Value Ref Range   Glucose-Capillary 126 (H) 70 - 99 mg/dL   Comment 1 Document in Chart   Glucose, capillary  Result Value Ref Range   Glucose-Capillary 109 (H) 70 - 99 mg/dL    Discharge Medications:   Allergies as of 07/04/2019      Reactions   Contrast Media [iodinated Diagnostic Agents]    Throat swells from ct con tra   Adhesive [tape]    No paper tape, causes blisters   Erythromycin Rash    Penicillins Rash   Did it involve swelling of the face/tongue/throat, SOB, or low BP? No Did it involve sudden or severe rash/hives, skin peeling, or any reaction on the inside of your mouth or nose? No Did you need to seek medical attention at a hospital or doctor's office? No When did it last happen?Many years If all above answers are "NO", may proceed with cephalosporin use.      Medication List    STOP taking these medications   traMADol 50 MG tablet Commonly known as: ULTRAM     TAKE these medications   acetaminophen 500 MG tablet Commonly known as: TYLENOL Take 500-1,000 mg by mouth every 6 (six) hours as needed for mild pain or headache.   amLODipine 5 MG tablet Commonly known as: NORVASC Take 1 tablet (5 mg total) by mouth daily.   aspirin 81 MG chewable tablet Chew 1 tablet (81 mg total) by mouth 2 (two) times daily.   BD Pen Needle Nano U/F 32G X 4 MM Misc Generic drug: Insulin Pen Needle USE  4 TIMES DAILY TO  INJECT  INSULIN   Biotin 10000 MCG Tabs Take 10,000 mcg by mouth daily.   calcium-vitamin D 500-200 MG-UNIT tablet Commonly known as: OSCAL WITH D Take 2 tablets by mouth daily with breakfast.   Farxiga 5 MG Tabs tablet Generic drug: dapagliflozin propanediol Take 5 mg by mouth daily.   fluticasone 50 MCG/ACT nasal spray Commonly known as: FLONASE SPRAY 2 SPRAYS INTO EACH NOSTRIL EVERY DAY What changed: See the new instructions.   folic acid 1 MG tablet Commonly known as: FOLVITE Take 2 tablets (2 mg total) by mouth daily.   furosemide 40 MG tablet Commonly known as: LASIX Take 2 tablets by mouth once daily What changed: how much to take   gabapentin 300 MG capsule Commonly known as: NEURONTIN Take 1 capsule (300 mg total) by mouth 3 (three) times daily.   GLUCOSAMINE PO Take 1 capsule by mouth daily.   hydroxychloroquine 200 MG tablet Commonly known as: PLAQUENIL Take 1 tablet by mouth twice daily   levothyroxine 137 MCG  tablet Commonly known as: SYNTHROID Take 1 tablet by mouth once daily What changed:   how much to take  how to take this  when to take this  additional instructions   Linzess 290 MCG Caps capsule Generic drug: linaclotide Take 1 capsule (290 mcg total) by mouth daily. What changed:   when to take this  reasons to take this   metFORMIN 1000 MG tablet Commonly known as: GLUCOPHAGE TAKE 1 TABLET BY MOUTH TWICE DAILY WITH A MEAL What changed: See the  new instructions.   methocarbamol 500 MG tablet Commonly known as: Robaxin Take 1 tablet (500 mg total) by mouth every 8 (eight) hours as needed. What changed: reasons to take this   methotrexate 2.5 MG tablet Commonly known as: RHEUMATREX Take 7 tablets (17.5 mg total) by mouth once a week. PROTECT FROM LIGHT   montelukast 10 MG tablet Commonly known as: SINGULAIR Take 1 tablet (10 mg total) by mouth at bedtime.   multivitamin with minerals Tabs tablet Take 1 tablet by mouth daily.   NovoLOG FlexPen 100 UNIT/ML FlexPen Generic drug: insulin aspart Inject 15-22 units under the skin three times daily before meals. What changed:   how much to take  how to take this  when to take this  additional instructions   OneTouch Delica Lancets [id number] Misc USE TO CHECK BLOOD SUGAR UP TO THREE TIMES A DAY 2 HOURS AFTER A MEAL AND UP TO 3 TIMES A WEEK UPON WAKING UP   OneTouch Verio test strip Generic drug: glucose blood Use Onetouch verio test strips as instructed to check blood sugars 4 times daily. DX:E11.65   oxyCODONE 5 MG immediate release tablet Commonly known as: Oxy IR/ROXICODONE Take 1 tablet (5 mg total) by mouth every 4 (four) hours as needed for moderate pain (pain score 4-6).   potassium chloride 10 MEQ tablet Commonly known as: KLOR-CON Take 1 tablet (10 mEq total) by mouth 2 (two) times daily. What changed: when to take this   rosuvastatin 40 MG tablet Commonly known as: CRESTOR Take 1 tablet (40 mg  total) by mouth daily. Annual appt due in June must see provider for future refills   Tresiba FlexTouch 200 UNIT/ML FlexTouch Pen Generic drug: insulin degludec Inject 40 units under the skin once daily.   valsartan-hydrochlorothiazide 320-12.5 MG tablet Commonly known as: DIOVAN-HCT Take 1 tablet by mouth once daily       Diagnostic Studies: No results found.  Disposition: Discharge disposition: 01-Home or Self Care       Discharge Instructions    Call MD / Call 911   Complete by: As directed    If you experience chest pain or shortness of breath, CALL 911 and be transported to the hospital emergency room.  If you develope a fever above 101 F, pus (white drainage) or increased drainage or redness at the wound, or calf pain, call your surgeon's office.   Constipation Prevention   Complete by: As directed    Drink plenty of fluids.  Prune juice may be helpful.  You may use a stool softener, such as Colace (over the counter) 100 mg twice a day.  Use MiraLax (over the counter) for constipation as needed.   Diet - low sodium heart healthy   Complete by: As directed    Discharge instructions   Complete by: As directed    You may shower, dressing is waterproof.  Do not remove the dressing, we will remove it at your first post-op appointment.  Do not take a bath or soak the knee in a tub or pool.  You may weightbear as you can tolerate on the operative leg with a walker.  Continue using the CPM machine 3 times per day for one hour each time, increasing the degrees of range of motion daily.  Use the blue cradle boot under your heel to work on getting your leg straight.  Do NOT put a pillow under your knee.  You will follow-up with Dr. Marlou Sa in the clinic in 1-2  weeks at your given appointment date.   Increase activity slowly as tolerated   Complete by: As directed          Signed: Donella Stade 07/14/2019, 1:17 PM

## 2019-07-15 ENCOUNTER — Ambulatory Visit: Payer: Self-pay

## 2019-07-15 ENCOUNTER — Other Ambulatory Visit: Payer: Self-pay

## 2019-07-15 ENCOUNTER — Ambulatory Visit (INDEPENDENT_AMBULATORY_CARE_PROVIDER_SITE_OTHER): Payer: Medicare Other | Admitting: Surgical

## 2019-07-15 ENCOUNTER — Encounter: Payer: Self-pay | Admitting: Surgical

## 2019-07-15 DIAGNOSIS — Z96652 Presence of left artificial knee joint: Secondary | ICD-10-CM

## 2019-07-15 MED ORDER — METHOCARBAMOL 500 MG PO TABS: 500.0000 mg | ORAL_TABLET | Freq: Three times a day (TID) | ORAL | 0 refills | Status: DC | PRN

## 2019-07-15 MED ORDER — OXYCODONE HCL 5 MG PO TABS: 5.0000 mg | ORAL_TABLET | Freq: Four times a day (QID) | ORAL | 0 refills | Status: DC | PRN

## 2019-07-15 NOTE — Progress Notes (Signed)
Post-Op Visit Note   Patient: Marissa Roberts           Date of Birth: 01-Jun-1945           MRN: [id number] Visit Date: 07/15/2019 PCP: [name], MD   Assessment & Plan:  Chief Complaint: No chief complaint on file.  Visit Diagnoses:  1. Status post total left knee replacement     Plan: Patient is a 74 year old female who presents s/p left total knee arthroplasty on 07/01/2019.  Patient notes that she is doing well.  She has progressed from ambulating with a walker to ambulating with a cane.  She is taking oxycodone 5 mg every 4-6 hours and Robaxin every 8 hours.  She has not been using the CPM machine but every other day.  She has been doing home health physical therapy.  She has completed therapy.  On exam her incision is healing well with no evidence of dehiscence.  She denies any fevers, chills, night sweats, malaise.  She has 0 degrees of extension and 90 to 95 degrees of flexion.  No calf tenderness and a negative Bevelyn Buckles' sign today.  Radiographs of the left knee reveal a well aligned prosthesis without any evidence of complicating features.  Patient will transition from taking aspirin twice a day to aspirin once a day.  Provided handicap placard and we will transition patient to outpatient physical therapy.  Follow-up in 4 weeks for clinical recheck.  Medications refilled.  Follow-Up Instructions: No follow-ups on file.   Orders:  Orders Placed This Encounter  Procedures  . XR Knee 1-2 Views Left  . Ambulatory referral to Physical Therapy   Meds ordered this encounter  Medications  . methocarbamol (ROBAXIN) 500 MG tablet    Sig: Take 1 tablet (500 mg total) by mouth every 8 (eight) hours as needed for muscle spasms.    Dispense:  30 tablet    Refill:  0  . oxyCODONE (OXY IR/ROXICODONE) 5 MG immediate release tablet    Sig: Take 1 tablet (5 mg total) by mouth every 6 (six) hours as needed for moderate pain (pain score 4-6).    Dispense:  30 tablet    Refill:   0    Imaging: No results found.  PMFS History: Patient Active Problem List   Diagnosis Date Noted  . Arthritis of left knee 07/01/2019  . B12 deficiency 03/23/2019  . Anemia 02/05/2019  . Rheumatoid arthritis with rheumatoid factor of multiple sites without organ or systems involvement ([place]) 01/25/2019  . High risk medication use 01/25/2019  . Fatigue 09/18/2018  . Routine general medical examination at a health care facility 02/07/2015  . Morbid obesity ([place]) 04/19/2013  . Hyperlipidemia associated with type 2 diabetes mellitus ([place]) 07/28/2009  . Osteopenia 03/14/2009  . Diabetes mellitus type 2 with complications ([place]) 0000000  . Hypothyroidism 09/24/2006  . Essential hypertension 09/24/2006   Past Medical History:  Diagnosis Date  . Arthritis   . Bronchitis    hx of  . Diabetes mellitus   . Family history of adverse reaction to anesthesia    per patient, "daughter threw up after a knee surgery"  . GERD (gastroesophageal reflux disease)   . Hypercholesteremia   . Hypertension   . Hypothyroidism   . Pneumonia    hx of  . PONV (postoperative nausea and vomiting)   . Thyroid disease     Family History  Problem Relation Age of Onset  . Diabetes Mother   .  Heart disease Mother   . Hyperlipidemia Mother   . Diabetes Father   . Diabetes Sister   . Hypertension Sister   . Heart disease Sister   . Dementia Sister   . Heart disease Brother   . Healthy Daughter   . Healthy Daughter   . Healthy Daughter   . Healthy Son     Past Surgical History:  Procedure Laterality Date  . ABDOMINAL HYSTERECTOMY     partial  . BREAST SURGERY     lumpectomy left breast  . carpel tunnel    . COLONOSCOPY W/ POLYPECTOMY    . FOOT SURGERY    . knee arhtroscopy    . SHOULDER ARTHROSCOPY WITH SUBACROMIAL DECOMPRESSION, ROTATOR CUFF REPAIR AND BICEP TENDON REPAIR Right 05/26/2012   Procedure: Right Shoulder Diagnostic Operative Arthroscopy, Subacromial Decompression, Biceps  Tenodesis, Mini Open Rotator Cuff Repair;  Surgeon: [name], MD;  Location: [facility];  Service: Orthopedics;  Laterality: Right;  Right Shoulder Diagnostic Operative Arthroscopy, Subacromial Decompression, Biceps Tenodesis, Mini Open Rotator Cuff Repair  . TONSILLECTOMY    . TOTAL KNEE ARTHROPLASTY Left 07/01/2019   Procedure: LEFT TOTAL KNEE ARTHROPLASTY;  Surgeon: [name], MD;  Location: [facility];  Service: Orthopedics;  Laterality: Left;   Social History   Occupational History  . Not on file  Tobacco Use  . Smoking status: Former Smoker    Quit date: 04/01/1990    Years since quitting: 29.3  . Smokeless tobacco: Never Used  Substance and Sexual Activity  . Alcohol use: Not Currently    Comment: 06/22/2019: per patient about 20-25 years ago  . Drug use: No  . Sexual activity: Never    Birth control/protection: Abstinence

## 2019-07-16 ENCOUNTER — Other Ambulatory Visit: Payer: Self-pay

## 2019-07-16 ENCOUNTER — Encounter: Payer: Self-pay | Admitting: Physical Therapy

## 2019-07-16 ENCOUNTER — Ambulatory Visit: Payer: Medicare Other | Attending: Surgical | Admitting: Physical Therapy

## 2019-07-16 DIAGNOSIS — G8929 Other chronic pain: Secondary | ICD-10-CM | POA: Insufficient documentation

## 2019-07-16 DIAGNOSIS — M25662 Stiffness of left knee, not elsewhere classified: Secondary | ICD-10-CM | POA: Insufficient documentation

## 2019-07-16 DIAGNOSIS — M6281 Muscle weakness (generalized): Secondary | ICD-10-CM | POA: Diagnosis not present

## 2019-07-16 DIAGNOSIS — M25562 Pain in left knee: Secondary | ICD-10-CM | POA: Insufficient documentation

## 2019-07-16 DIAGNOSIS — R600 Localized edema: Secondary | ICD-10-CM | POA: Diagnosis not present

## 2019-07-16 DIAGNOSIS — R2689 Other abnormalities of gait and mobility: Secondary | ICD-10-CM | POA: Diagnosis not present

## 2019-07-16 NOTE — Patient Instructions (Signed)
Access Code: [email or web address] URL: [email or web address] Date: 07/16/2019 Prepared by: Hilda Blades  Exercises Long Sitting Calf Stretch with Strap - 3 x daily - 7 x weekly - 3 reps - 20 seconds hold Seated Hamstring Stretch - 3 x daily - 7 x weekly - 3 reps - 20 seconds hold Supine Heel Slide with Strap - 3 x daily - 7 x weekly - 10 reps - 5 seconds hold Seated Knee Flexion Stretch - 3 x daily - 7 x weekly - 10 reps - 5 seconds hold Seated Knee Flexion Stretch - 3 x daily - 7 x weekly - 5 reps - 5 seconds hold Supine Quad Set - 2 x daily - 7 x weekly - 10 reps - 3 seconds hold Hooklying Straight Leg Raise - 2 x daily - 7 x weekly - 10 reps Seated Long Arc Quad - 2 x daily - 7 x weekly - 20 reps Standing March with Counter Support - 2 x daily - 7 x weekly - 10 reps Standing Hip Abduction with Counter Support - 2 x daily - 7 x weekly - 10 reps Standing Hip Extension with Counter Support - 2 x daily - 7 x weekly - 10 reps

## 2019-07-16 NOTE — Therapy (Signed)
[place], [place], [address] Phone: [phone]   Fax:  [phone]  Physical Therapy Evaluation  Patient Details  Name: Marissa Roberts MRN: [id number] Date of Birth: 09-18-1945 Referring Provider (PT): Donella Stade, [place]   Encounter Date: 07/16/2019  PT End of Session - 07/16/19 1140    Visit Number  1    Number of Visits  16    Date for PT Re-Evaluation  09/10/19    Authorization Type  BCBS MCR    PT Start Time  1130    PT Stop Time  1215    PT Time Calculation (min)  45 min    Activity Tolerance  Patient tolerated treatment well    Behavior During Therapy  [facility] for tasks assessed/performed       Past Medical History:  Diagnosis Date  . Arthritis   . Bronchitis    hx of  . Diabetes mellitus   . Family history of adverse reaction to anesthesia    per patient, "daughter threw up after a knee surgery"  . GERD (gastroesophageal reflux disease)   . Hypercholesteremia   . Hypertension   . Hypothyroidism   . Pneumonia    hx of  . PONV (postoperative nausea and vomiting)   . Thyroid disease     Past Surgical History:  Procedure Laterality Date  . ABDOMINAL HYSTERECTOMY     partial  . BREAST SURGERY     lumpectomy left breast  . carpel tunnel    . COLONOSCOPY W/ POLYPECTOMY    . FOOT SURGERY    . knee arhtroscopy    . SHOULDER ARTHROSCOPY WITH SUBACROMIAL DECOMPRESSION, ROTATOR CUFF REPAIR AND BICEP TENDON REPAIR Right 05/26/2012   Procedure: Right Shoulder Diagnostic Operative Arthroscopy, Subacromial Decompression, Biceps Tenodesis, Mini Open Rotator Cuff Repair;  Surgeon: [name], MD;  Location: [place];  Service: Orthopedics;  Laterality: Right;  Right Shoulder Diagnostic Operative Arthroscopy, Subacromial Decompression, Biceps Tenodesis, Mini Open Rotator Cuff Repair  . TONSILLECTOMY    . TOTAL KNEE ARTHROPLASTY Left 07/01/2019   Procedure: LEFT TOTAL KNEE ARTHROPLASTY;   Surgeon: [name], MD;  Location: [place];  Service: Orthopedics;  Laterality: Left;    There were no vitals filed for this visit.   Subjective Assessment - 07/16/19 1134    Subjective  Patient had a left TKA on 07/01/2019 due to severe knee pain and limitation. She has had a few weeks of HHPT and she has transitioned to walking with a cane. Her knee pain is improving, it does get stiff sometimes.    Pertinent History  PMH includes: RA, DM II, HTN, hypothyroidism, osteopenia, rheumatoid arthritis, and obesity    Limitations  Lifting;Standing;Walking;House hold activities    How long can you sit comfortably?  No limitation - does have pain when she does to move after sitting for extended periods of time    How long can you stand comfortably?  20 minutes    How long can you walk comfortably?  20 minutes    Patient Stated Goals  Patient wants to walk without pain, get down and get up when she wants, act spontaneous without pain or fear    Currently in Pain?  Yes    Pain Score  5     Pain Location  Knee    Pain Orientation  Left    Pain Descriptors / Indicators  Tightness;Aching    Pain Type  Surgical pain  Pain Onset  More than a month ago    Pain Frequency  Constant    Aggravating Factors   Bending the knee, standing, walking    Pain Relieving Factors  Ice, medication, rest, elevation    Effect of Pain on Daily Activities  Patient is limited in performing household tasks         [facility] PT Assessment - 07/16/19 0001      Assessment   Medical Diagnosis  Status post total left knee replacement    Referring Provider (PT)  Magnant, Gerrianne Scale, PA-C    Onset Date/Surgical Date  07/01/19    Hand Dominance  Right    Next MD Visit  08/12/2019    Prior Therapy  Yes      Precautions   Precautions  None      Restrictions   Weight Bearing Restrictions  No      Balance Screen   Has the patient fallen in the past 6 months  No    Has the patient had a decrease in activity level  because of a fear of falling?   No    Is the patient reluctant to leave their home because of a fear of falling?   No      Home Environment   Living Environment  Private residence    Living Arrangements  Spouse/significant other    Type of [place] to enter    Entrance Stairs-Number of Steps  4    Entrance Stairs-Rails  Cannot reach both    [place]  One level      Prior Function   Level of Independence  Independent with household mobility with device      Cognition   Overall Cognitive Status  Within Functional Limits for tasks assessed      Observation/Other Assessments   Observations  Patient appears in no apparent distress    Focus on Therapeutic Outcomes (FOTO)   40% limitation      Observation/Other Assessments-Edema    Edema  --   Patient exhibits increased edema of left knee and calf     Sensation   Light Touch  Appears Intact   she does report some numbness lateral knee     Functional Tests   Functional tests  Sit to Stand;Single leg stance      Single Leg Stance   Comments  Unable to maintain SL stance on left leg      Sit to Stand   Comments  Patient reaches left leg out further, shifts weight to right, requires BUE assist to stand      Posture/Postural Control   Posture Comments  Rounded shoulder and forward head posture      ROM / Strength   AROM / PROM / Strength  AROM;PROM;Strength      AROM   AROM Assessment Site  Knee    Right/Left Knee  Right;Left    Right Knee Extension  0    Right Knee Flexion  120    Left Knee Extension  5   lacking   Left Knee Flexion  85      PROM   PROM Assessment Site  Knee    Right/Left Knee  Left    Left Knee Extension  2   lacking   Left Knee Flexion  95      Strength   Strength Assessment Site  Knee;Hip;Ankle    Right/Left Hip  Right;Left  Right Hip Flexion  4/5    Right Hip Extension  4-/5    Right Hip ABduction  4-/5    Left Hip Flexion  4-/5    Left Hip Extension  3+/5     Left Hip ABduction  3+/5    Right/Left Knee  Right;Left    Right Knee Flexion  4/5    Right Knee Extension  5/5    Left Knee Flexion  4-/5    Left Knee Extension  3+/5    Right/Left Ankle  Right;Left    Right Ankle Dorsiflexion  5/5    Left Ankle Plantar Flexion  5/5      Flexibility   Soft Tissue Assessment /Muscle Length  yes   gastroc limited   Hamstrings  LImited    Quadriceps  Limited      Palpation   Patella mobility  Limited in all directions    Palpation comment  Non-TTP, increased muscular tension left quad      Special Tests   Other special tests  None performed      Transfers   Transfers  Independent with all Transfers      Ambulation/Gait   Ambulation/Gait  Yes    Ambulation/Gait Assistance  6: Modified independent (Device/Increase time)    Assistive device  Straight cane    Gait Comments  Patient exhibits antalgic on left, flat foot strike, her cane is too high and she keeps it too far out to the side                Objective measurements completed on examination: See above findings.      [facility] [place] Adult PT Treatment/Exercise - 07/16/19 0001      Exercises   Exercises  Knee/Hip      Knee/Hip Exercises: Stretches   Active Hamstring Stretch  2 reps;20 seconds    Active Hamstring Stretch Limitations  seated edge of mat with heel on floor    Knee: Self-Stretch Limitations  Seated knee flexion stretch using opposite leg and with scoot with 5 sec hold x5 each    Gastroc Stretch  2 reps;20 seconds    Gastroc Stretch Limitations  longsitting with strap      Knee/Hip Exercises: Standing   Hip Flexion  10 reps    Hip Flexion Limitations  marching, BUE support    Hip Abduction  10 reps    Abduction Limitations  BUE support    Hip Extension  10 reps    Extension Limitations  BUE support      Knee/Hip Exercises: Seated   Long Arc Quad  10 reps      Knee/Hip Exercises: Supine   Quad Sets  10 reps   3 sec hold   Quad Sets Limitations  towel under  knee    Heel Slides  10 reps    Heel Slides Limitations  with strap assist at end range for stretch with 5 sec hold    Straight Leg Raises  10 reps    Straight Leg Raises Limitations  towel under knee with quad set             PT Education - 07/16/19 1140    Education Details  Exam findings, POC, HEP    Person(s) Educated  Patient    Methods  Explanation;Demonstration;Tactile cues;Verbal cues;Handout    Comprehension  Verbalized understanding;Returned demonstration;Verbal cues required;Tactile cues required;Need further instruction       PT Short Term Goals - 07/16/19 1144  PT SHORT TERM GOAL #1   Title  Patient will be I with initial HEP to progress in PT    Time  4    Period  Weeks    Status  New    Target Date  08/13/19      PT SHORT TERM GOAL #2   Title  Patient will exhibit >/= 105 deg knee flexion to improve transfers    Time  4    Period  Weeks    Status  New    Target Date  08/13/19      PT SHORT TERM GOAL #3   Title  Patient will be able to walk household distances without AD or gait deviation to improve ability to perform light household tasks    Time  4    Period  Weeks    Status  New    Target Date  08/13/19      PT SHORT TERM GOAL #4   Title  Patient will report knee pain with activity </= 3/10 to reduce compensation and improve functional ability    Time  4    Period  Weeks    Status  New    Target Date  08/13/19        PT Long Term Goals - 07/16/19 1238      PT LONG TERM GOAL #1   Title  Patient will be I with final HEP to maintain progress from PT    Time  8    Period  Weeks    Status  New    Target Date  09/10/19      PT LONG TERM GOAL #2   Title  Patient will be able to walk 1 mile without AD and only a little bit of difficulty to improve community access and shopping ability    Time  8    Period  Weeks    Status  New    Target Date  09/10/19      PT LONG TERM GOAL #3   Title  Patient will exhibit left knee strength 5/5 MMT  to perform heavy household tasks and negotiate stairs with little to no difficulty    Time  8    Period  Weeks    Status  New    Target Date  09/10/19      PT LONG TERM GOAL #4   Title  Patient will exhibit improved knee AROM to >/= 120 deg flexion and 0 deg extension to normalize transfers and functional movement    Time  8    Period  Weeks    Status  New    Target Date  09/10/19             Plan - 07/16/19 1142    Clinical Impression Statement  Patient presents to PT following left TKA on 07/01/2019. Currently she exhibits knee pain with activity and bending, limitation in knee range of motion especially flexion, strength deficit of the knee and hips, altered gait mechanics and using AD with ambulation, balance deficit, and trouble performing household tasks due to impairements. She was provided with exercises to progress her motion and strength as she was already doing well with previous exerices prescribed by HHPT. She would benefit from continued skilled PT to address listed impairments in order to reduce her pain and allow her to return to walking and activity with no limitation.    Personal Factors and Comorbidities  Fitness;Past/Current Experience;Time since onset of injury/illness/exacerbation;Comorbidity 3+  Comorbidities  PMH includes: RA, DM II, HTN, hypothyroidism, osteopenia, rheumatoid arthritis, and obesity    Examination-Activity Limitations  Locomotion Level;Carry;Dressing;Hygiene/Grooming;Lift;Stand;Stairs;Squat;Sit;Transfers    Examination-Participation Restrictions  Church;Meal Prep;Cleaning;Community Activity;Driving;Shop;Laundry;Yard Work    Stability/Clinical Decision Making  Stable/Uncomplicated    Clinical Decision Making  Low    Rehab Potential  Good    PT Frequency  2x / week    PT Duration  8 weeks    PT Treatment/Interventions  ADLs/Self Care Home Management;Cryotherapy;Electrical Stimulation;Moist Heat;Neuromuscular re-education;Balance  training;Therapeutic exercise;Therapeutic activities;Functional mobility training;Stair training;Gait training;Patient/family education;Manual techniques;Dry needling;Passive range of motion;Taping;Joint Manipulations;Vasopneumatic Device    PT Next Visit Plan  Assess HEP and progress PRN, NuStep vs bike, manual and stretching for knee flexion motion, progress LLE strengthening, gait training to wean from cane, vaso for edema    PT Home Exercise Plan  YNBNQEDX: longsitting calf stretch, seated hamstring stretch, supine heel slide with strap, seated knee flexion stretch with other leg assist and with scoot, quad set, SLR, LAQ, standing hip abduction, extension, marching    Consulted and Agree with Plan of Care  Patient       Patient will benefit from skilled therapeutic intervention in order to improve the following deficits and impairments:  Abnormal gait, Decreased range of motion, Difficulty walking, Decreased activity tolerance, Pain, Decreased balance, Improper body mechanics, Decreased strength, Increased edema, Postural dysfunction  Visit Diagnosis: Chronic pain of left knee  Stiffness of left knee, not elsewhere classified  Muscle weakness (generalized)  Other abnormalities of gait and mobility  Localized edema     Problem List Patient Active Problem List   Diagnosis Date Noted  . Arthritis of left knee 07/01/2019  . B12 deficiency 03/23/2019  . Anemia 02/05/2019  . Rheumatoid arthritis with rheumatoid factor of multiple sites without organ or systems involvement ([place]) 01/25/2019  . High risk medication use 01/25/2019  . Fatigue 09/18/2018  . Routine general medical examination at a health care facility 02/07/2015  . Morbid obesity ([place]) 04/19/2013  . Hyperlipidemia associated with type 2 diabetes mellitus ([place]) 07/28/2009  . Osteopenia 03/14/2009  . Diabetes mellitus type 2 with complications ([place]) 0000000  . Hypothyroidism 09/24/2006  . Essential hypertension  09/24/2006    Hilda Blades, PT, DPT, LAT, ATC 07/16/19  12:45 PM Phone: [phone] Fax: [facility] [address] [place], [place], [address] Phone: [phone]   Fax:  [phone]  Name: Marissa Roberts MRN: [id number] Date of Birth: [birth year]

## 2019-07-19 ENCOUNTER — Ambulatory Visit: Payer: Medicare Other | Admitting: Physical Therapy

## 2019-07-19 ENCOUNTER — Other Ambulatory Visit: Payer: Self-pay

## 2019-07-19 ENCOUNTER — Encounter: Payer: Self-pay | Admitting: Physical Therapy

## 2019-07-19 DIAGNOSIS — G8929 Other chronic pain: Secondary | ICD-10-CM | POA: Diagnosis not present

## 2019-07-19 DIAGNOSIS — R600 Localized edema: Secondary | ICD-10-CM

## 2019-07-19 DIAGNOSIS — M25562 Pain in left knee: Secondary | ICD-10-CM | POA: Diagnosis not present

## 2019-07-19 DIAGNOSIS — M25662 Stiffness of left knee, not elsewhere classified: Secondary | ICD-10-CM

## 2019-07-19 DIAGNOSIS — R2689 Other abnormalities of gait and mobility: Secondary | ICD-10-CM | POA: Diagnosis not present

## 2019-07-19 DIAGNOSIS — M6281 Muscle weakness (generalized): Secondary | ICD-10-CM

## 2019-07-19 NOTE — Therapy (Signed)
[place], [place], [address] Phone: [phone]   Fax:  [phone]  Physical Therapy Treatment  Patient Details  Name: Marissa Roberts MRN: [id number] Date of Birth: Jul 15, 1945 Referring Provider (PT): Donella Stade, [place]   Encounter Date: 07/19/2019  PT End of Session - 07/19/19 1546    Visit Number  2    Number of Visits  16    Date for PT Re-Evaluation  09/10/19    Authorization Type  BCBS MCR    PT Start Time  [id number]    PT Stop Time  1620    PT Time Calculation (min)  41 min    Activity Tolerance  Patient tolerated treatment well    Behavior During Therapy  [facility] for tasks assessed/performed       Past Medical History:  Diagnosis Date  . Arthritis   . Bronchitis    hx of  . Diabetes mellitus   . Family history of adverse reaction to anesthesia    per patient, "daughter threw up after a knee surgery"  . GERD (gastroesophageal reflux disease)   . Hypercholesteremia   . Hypertension   . Hypothyroidism   . Pneumonia    hx of  . PONV (postoperative nausea and vomiting)   . Thyroid disease     Past Surgical History:  Procedure Laterality Date  . ABDOMINAL HYSTERECTOMY     partial  . BREAST SURGERY     lumpectomy left breast  . carpel tunnel    . COLONOSCOPY W/ POLYPECTOMY    . FOOT SURGERY    . knee arhtroscopy    . SHOULDER ARTHROSCOPY WITH SUBACROMIAL DECOMPRESSION, ROTATOR CUFF REPAIR AND BICEP TENDON REPAIR Right 05/26/2012   Procedure: Right Shoulder Diagnostic Operative Arthroscopy, Subacromial Decompression, Biceps Tenodesis, Mini Open Rotator Cuff Repair;  Surgeon: [name], MD;  Location: [place];  Service: Orthopedics;  Laterality: Right;  Right Shoulder Diagnostic Operative Arthroscopy, Subacromial Decompression, Biceps Tenodesis, Mini Open Rotator Cuff Repair  . TONSILLECTOMY    . TOTAL KNEE ARTHROPLASTY Left 07/01/2019   Procedure: LEFT TOTAL KNEE ARTHROPLASTY;   Surgeon: [name], MD;  Location: [place];  Service: Orthopedics;  Laterality: Left;    There were no vitals filed for this visit.  Subjective Assessment - 07/19/19 1544    Subjective  Patient reports she is doing well, consistent with exercises. Her knee is tired today from walking and getting in/out of truck.    Patient Stated Goals  Patient wants to walk without pain, get down and get up when she wants, act spontaneous without pain or fear    Currently in Pain?  Yes    Pain Score  4     Pain Location  Knee    Pain Orientation  Left    Pain Descriptors / Indicators  Aching;Tightness    Pain Type  Surgical pain    Pain Onset  More than a month ago    Pain Frequency  Constant         [facility] PT Assessment - 07/19/19 0001      AROM   Left Knee Extension  4   lacking   Left Knee Flexion  98                   [facility] Adult PT Treatment/Exercise - 07/19/19 0001      Exercises   Exercises  Knee/Hip      Knee/Hip Exercises: Stretches  Active Hamstring Stretch  2 reps;20 seconds    Active Hamstring Stretch Limitations  seated edge of mat with heel on floor      Knee/Hip Exercises: Aerobic   Nustep  L3 x 5 min (LE only)      Knee/Hip Exercises: Standing   Hip Abduction  2 sets;10 reps    Abduction Limitations  BUE support      Knee/Hip Exercises: Seated   Long Arc Quad  2 sets;10 reps    Long Arc Quad Weight  1 lbs.   2nd set only   Hamstring Curl  2 sets;10 reps    Hamstring Limitations  yellow band      Knee/Hip Exercises: Supine   Quad Sets  10 reps   3 sec hold   Straight Leg Raises  10 reps    Straight Leg Raises Limitations  towel under knee with quad set      Manual Therapy   Manual Therapy  Joint mobilization;Passive ROM;Soft tissue mobilization    Manual therapy comments  Focus on improving knee motion    Joint Mobilization  Left tibiofemoral and patellofemoral joint mobs    Soft tissue mobilization  Roller to left quad    Passive ROM   Left knee flexion and extension in supine and seated position             PT Education - 07/19/19 1546    Education Details  HEP    Person(s) Educated  Patient    Methods  Explanation;Demonstration;Tactile cues;Verbal cues;Handout    Comprehension  Verbalized understanding;Returned demonstration;Verbal cues required;Tactile cues required;Need further instruction       PT Short Term Goals - 07/16/19 1144      PT SHORT TERM GOAL #1   Title  Patient will be I with initial HEP to progress in PT    Time  4    Period  Weeks    Status  New    Target Date  08/13/19      PT SHORT TERM GOAL #2   Title  Patient will exhibit >/= 105 deg knee flexion to improve transfers    Time  4    Period  Weeks    Status  New    Target Date  08/13/19      PT SHORT TERM GOAL #3   Title  Patient will be able to walk household distances without AD or gait deviation to improve ability to perform light household tasks    Time  4    Period  Weeks    Status  New    Target Date  08/13/19      PT SHORT TERM GOAL #4   Title  Patient will report knee pain with activity </= 3/10 to reduce compensation and improve functional ability    Time  4    Period  Weeks    Status  New    Target Date  08/13/19        PT Long Term Goals - 07/16/19 1238      PT LONG TERM GOAL #1   Title  Patient will be I with final HEP to maintain progress from PT    Time  8    Period  Weeks    Status  New    Target Date  09/10/19      PT LONG TERM GOAL #2   Title  Patient will be able to walk 1 mile without AD and only a little bit of  difficulty to improve community access and shopping ability    Time  8    Period  Weeks    Status  New    Target Date  09/10/19      PT LONG TERM GOAL #3   Title  Patient will exhibit left knee strength 5/5 MMT to perform heavy household tasks and negotiate stairs with little to no difficulty    Time  8    Period  Weeks    Status  New    Target Date  09/10/19      PT LONG TERM  GOAL #4   Title  Patient will exhibit improved knee AROM to >/= 120 deg flexion and 0 deg extension to normalize transfers and functional movement    Time  8    Period  Weeks    Status  New    Target Date  09/10/19            Plan - 07/19/19 1547    Clinical Impression Statement  Patient tolerated therapy well with no adverse effects. She is progressing well with her knee motion and with qaud activation/strenthening. Tolerated added resistance well with no increased pain level. She would benefit from continued skilled PT to reduce her pain and allow her to return to walking and activity with no limitation.    PT Treatment/Interventions  ADLs/Self Care Home Management;Cryotherapy;Electrical Stimulation;Moist Heat;Neuromuscular re-education;Balance training;Therapeutic exercise;Therapeutic activities;Functional mobility training;Stair training;Gait training;Patient/family education;Manual techniques;Dry needling;Passive range of motion;Taping;Joint Manipulations;Vasopneumatic Device    PT Next Visit Plan  Assess HEP and progress PRN, NuStep vs bike, manual and stretching for knee flexion motion, progress LLE strengthening, gait training to wean from cane, vaso for edema    PT Home Exercise Plan  YNBNQEDX: longsitting calf stretch, seated hamstring stretch, supine heel slide with strap, seated knee flexion stretch with other leg assist and with scoot, quad set, SLR, LAQ, standing hip abduction, extension, marching    Consulted and Agree with Plan of Care  Patient       Patient will benefit from skilled therapeutic intervention in order to improve the following deficits and impairments:  Abnormal gait, Decreased range of motion, Difficulty walking, Decreased activity tolerance, Pain, Decreased balance, Improper body mechanics, Decreased strength, Increased edema, Postural dysfunction  Visit Diagnosis: Chronic pain of left knee  Stiffness of left knee, not elsewhere classified  Muscle  weakness (generalized)  Other abnormalities of gait and mobility  Localized edema     Problem List Patient Active Problem List   Diagnosis Date Noted  . Arthritis of left knee 07/01/2019  . B12 deficiency 03/23/2019  . Anemia 02/05/2019  . Rheumatoid arthritis with rheumatoid factor of multiple sites without organ or systems involvement ([place]) 01/25/2019  . High risk medication use 01/25/2019  . Fatigue 09/18/2018  . Routine general medical examination at a health care facility 02/07/2015  . Morbid obesity ([place]) 04/19/2013  . Hyperlipidemia associated with type 2 diabetes mellitus ([place]) 07/28/2009  . Osteopenia 03/14/2009  . Diabetes mellitus type 2 with complications ([place]) 0000000  . Hypothyroidism 09/24/2006  . Essential hypertension 09/24/2006    Hilda Blades, PT, DPT, LAT, ATC 07/19/19  4:29 PM Phone: [phone] Fax: [place] [facility] [address] [place], [place], [address] Phone: [phone]   Fax:  [phone]  Name: Marissa Roberts MRN: [id number] Date of Birth: [birth year]

## 2019-07-21 ENCOUNTER — Ambulatory Visit: Payer: Medicare Other | Admitting: Physical Therapy

## 2019-07-21 ENCOUNTER — Other Ambulatory Visit: Payer: Self-pay | Admitting: Endocrinology

## 2019-07-21 ENCOUNTER — Encounter: Payer: Self-pay | Admitting: Physical Therapy

## 2019-07-21 ENCOUNTER — Other Ambulatory Visit: Payer: Self-pay

## 2019-07-21 DIAGNOSIS — M6281 Muscle weakness (generalized): Secondary | ICD-10-CM | POA: Diagnosis not present

## 2019-07-21 DIAGNOSIS — G8929 Other chronic pain: Secondary | ICD-10-CM

## 2019-07-21 DIAGNOSIS — R600 Localized edema: Secondary | ICD-10-CM

## 2019-07-21 DIAGNOSIS — M25662 Stiffness of left knee, not elsewhere classified: Secondary | ICD-10-CM

## 2019-07-21 DIAGNOSIS — M25562 Pain in left knee: Secondary | ICD-10-CM | POA: Diagnosis not present

## 2019-07-21 DIAGNOSIS — R2689 Other abnormalities of gait and mobility: Secondary | ICD-10-CM

## 2019-07-21 NOTE — Therapy (Signed)
[place], [place], [address] Phone: [phone]   Fax:  [phone]  Physical Therapy Treatment  Patient Details  Name: Marissa Roberts MRN: [id number] Date of Birth: [birth year] Referring Provider (PT): Donella Stade, [place]   Encounter Date: 07/21/2019  PT End of Session - 07/21/19 1327    Visit Number  3    Number of Visits  16    Date for PT Re-Evaluation  09/10/19    Authorization Type  BC[employer] MCR    PT Start Time  1320    PT Stop Time  1410    PT Time Calculation (min)  50 min       Past Medical History:  Diagnosis Date  . Arthritis   . Bronchitis    hx of  . Diabetes mellitus   . Family history of adverse reaction to anesthesia    per patient, "daughter threw up after a knee surgery"  . GERD (gastroesophageal reflux disease)   . Hypercholesteremia   . Hypertension   . Hypothyroidism   . Pneumonia    hx of  . PONV (postoperative nausea and vomiting)   . Thyroid disease     Past Surgical History:  Procedure Laterality Date  . ABDOMINAL HYSTERECTOMY     partial  . BREAST SURGERY     lumpectomy left breast  . carpel tunnel    . COLONOSCOPY W/ POLYPECTOMY    . FOOT SURGERY    . knee arhtroscopy    . SHOULDER ARTHROSCOPY WITH SUBACROMIAL DECOMPRESSION, ROTATOR CUFF REPAIR AND BICEP TENDON REPAIR Right 05/26/2012   Procedure: Right Shoulder Diagnostic Operative Arthroscopy, Subacromial Decompression, Biceps Tenodesis, Mini Open Rotator Cuff Repair;  Surgeon: [name], MD;  Location: [facility];  Service: Orthopedics;  Laterality: Right;  Right Shoulder Diagnostic Operative Arthroscopy, Subacromial Decompression, Biceps Tenodesis, Mini Open Rotator Cuff Repair  . TONSILLECTOMY    . TOTAL KNEE ARTHROPLASTY Left 07/01/2019   Procedure: LEFT TOTAL KNEE ARTHROPLASTY;  Surgeon: [name], MD;  Location: [facility];  Service: Orthopedics;  Laterality: Left;    There were no vitals  filed for this visit.  Subjective Assessment - 07/21/19 1326    Subjective  Pain is 6-7/10 before pain meds because I was trying to exercise.    Pain Score  7     Pain Location  Knee    Pain Orientation  Left    Pain Descriptors / Indicators  Aching;Tightness    Pain Type  Surgical pain         [facility] PT Assessment - 07/21/19 0001      AROM   Left Knee Flexion  92                   [facility] Adult PT Treatment/Exercise - 07/21/19 0001      Knee/Hip Exercises: Stretches   Active Hamstring Stretch  2 reps;20 seconds    Active Hamstring Stretch Limitations  seated edge of mat with heel on floor    Gastroc Stretch  2 reps;20 seconds    Gastroc Stretch Limitations  longsitting with strap      Knee/Hip Exercises: Standing   Heel Raises  10 reps    Knee Flexion  10 reps    Hip Flexion  10 reps    Hip Flexion Limitations  marching, BUE support    Hip Abduction  Both;1 set;10 reps    Abduction Limitations  BUE support    Hip  Extension  Right;Left;10 reps    Extension Limitations  BUE support    Functional Squat  10 reps      Knee/Hip Exercises: Seated   Long Arc Quad  2 sets;10 reps    Long Arc Quad Weight  2 lbs.    Other Seated Knee/Hip Exercises  seated quad set with foot on floor, foot in chair     Hamstring Curl  2 sets;10 reps    Hamstring Limitations  red band       Knee/Hip Exercises: Supine   Quad Sets  10 reps   3 sec hold   Quad Sets Limitations  tactile cues     Heel Slides  10 reps    Heel Slides Limitations  with strap assist at end range for stretch with 5 sec hold, x 10 AROM    Straight Leg Raises  10 reps      Modalities   Modalities  Vasopneumatic      Vasopneumatic   Number Minutes Vasopneumatic   10 minutes    Vasopnuematic Location   Knee    Vasopneumatic Pressure  Low    Vasopneumatic Temperature   34      Manual Therapy   Joint Mobilization  patella mobs    Soft tissue mobilization  Lateral quad STW               PT  Short Term Goals - 07/16/19 1144      PT SHORT TERM GOAL #1   Title  Patient will be I with initial HEP to progress in PT    Time  4    Period  Weeks    Status  New    Target Date  08/13/19      PT SHORT TERM GOAL #2   Title  Patient will exhibit >/= 105 deg knee flexion to improve transfers    Time  4    Period  Weeks    Status  New    Target Date  08/13/19      PT SHORT TERM GOAL #3   Title  Patient will be able to walk household distances without AD or gait deviation to improve ability to perform light household tasks    Time  4    Period  Weeks    Status  New    Target Date  08/13/19      PT SHORT TERM GOAL #4   Title  Patient will report knee pain with activity </= 3/10 to reduce compensation and improve functional ability    Time  4    Period  Weeks    Status  New    Target Date  08/13/19        PT Long Term Goals - 07/16/19 1238      PT LONG TERM GOAL #1   Title  Patient will be I with final HEP to maintain progress from PT    Time  8    Period  Weeks    Status  New    Target Date  09/10/19      PT LONG TERM GOAL #2   Title  Patient will be able to walk 1 mile without AD and only a little bit of difficulty to improve community access and shopping ability    Time  8    Period  Weeks    Status  New    Target Date  09/10/19      PT LONG TERM GOAL #3  Title  Patient will exhibit left knee strength 5/5 MMT to perform heavy household tasks and negotiate stairs with little to no difficulty    Time  8    Period  Weeks    Status  New    Target Date  09/10/19      PT LONG TERM GOAL #4   Title  Patient will exhibit improved knee AROM to >/= 120 deg flexion and 0 deg extension to normalize transfers and functional movement    Time  8    Period  Weeks    Status  New    Target Date  09/10/19            Plan - 07/21/19 1359    Clinical Impression Statement  Less ROM today.Pt reports it is because she was sitting and stiff before she came today.  Continued with ROM and strengthening per PT POC. Trial of Vaso for edema. Encouraged pt to get ice gel packs. She is currently purchasing 2 bags of ice a day and putting it in a cooler.    PT Next Visit Plan  Assess HEP and progress PRN, NuStep vs bike, manual and stretching for knee flexion motion, progress LLE strengthening, gait training to wean from cane, vaso for edema    PT Home Exercise Plan  YNBNQEDX: longsitting calf stretch, seated hamstring stretch, supine heel slide with strap, seated knee flexion stretch with other leg assist and with scoot, quad set, SLR, LAQ, standing hip abduction, extension, marching       Patient will benefit from skilled therapeutic intervention in order to improve the following deficits and impairments:  Abnormal gait, Decreased range of motion, Difficulty walking, Decreased activity tolerance, Pain, Decreased balance, Improper body mechanics, Decreased strength, Increased edema, Postural dysfunction  Visit Diagnosis: Stiffness of left knee, not elsewhere classified  Muscle weakness (generalized)  Other abnormalities of gait and mobility  Localized edema  Chronic pain of left knee     Problem List Patient Active Problem List   Diagnosis Date Noted  . Arthritis of left knee 07/01/2019  . B12 deficiency 03/23/2019  . Anemia 02/05/2019  . Rheumatoid arthritis with rheumatoid factor of multiple sites without organ or systems involvement ([place]) 01/25/2019  . High risk medication use 01/25/2019  . Fatigue 09/18/2018  . Routine general medical examination at a health care facility 02/07/2015  . Morbid obesity ([place]) 04/19/2013  . Hyperlipidemia associated with type 2 diabetes mellitus ([place]) 07/28/2009  . Osteopenia 03/14/2009  . Diabetes mellitus type 2 with complications ([place]) 0000000  . Hypothyroidism 09/24/2006  . Essential hypertension 09/24/2006    Dorene Ar , PTA 07/21/2019, 2:06 PM  [facility] [address] [place], [place], [address] Phone: [phone]   Fax:  [phone]  Name: Marissa Roberts MRN: [id number] Date of Birth: 1945-05-23

## 2019-07-26 ENCOUNTER — Ambulatory Visit: Payer: Medicare Other | Admitting: Physical Therapy

## 2019-07-26 ENCOUNTER — Other Ambulatory Visit: Payer: Self-pay

## 2019-07-26 ENCOUNTER — Other Ambulatory Visit: Payer: Self-pay | Admitting: Rheumatology

## 2019-07-26 DIAGNOSIS — M6281 Muscle weakness (generalized): Secondary | ICD-10-CM | POA: Diagnosis not present

## 2019-07-26 DIAGNOSIS — R2689 Other abnormalities of gait and mobility: Secondary | ICD-10-CM

## 2019-07-26 DIAGNOSIS — M25562 Pain in left knee: Secondary | ICD-10-CM | POA: Diagnosis not present

## 2019-07-26 DIAGNOSIS — R600 Localized edema: Secondary | ICD-10-CM

## 2019-07-26 DIAGNOSIS — G8929 Other chronic pain: Secondary | ICD-10-CM | POA: Diagnosis not present

## 2019-07-26 DIAGNOSIS — M25662 Stiffness of left knee, not elsewhere classified: Secondary | ICD-10-CM

## 2019-07-26 DIAGNOSIS — M0579 Rheumatoid arthritis with rheumatoid factor of multiple sites without organ or systems involvement: Secondary | ICD-10-CM

## 2019-07-26 NOTE — Therapy (Signed)
[place], [place], [address] Phone: [phone]   Fax:  [phone]  Physical Therapy Treatment  Patient Details  Name: Marissa Roberts MRN: [id number] Date of Birth: 10-01-1945 Referring Provider (PT): Donella Stade, [place]   Encounter Date: 07/26/2019  PT End of Session - 07/26/19 1157    Visit Number  4    Number of Visits  16    Date for PT Re-Evaluation  09/10/19    Authorization Type  [employer] MCR    PT Start Time  1153   8 minutes late   PT Stop Time  1238    PT Time Calculation (min)  45 min       Past Medical History:  Diagnosis Date  . Arthritis   . Bronchitis    hx of  . Diabetes mellitus   . Family history of adverse reaction to anesthesia    per patient, "daughter threw up after a knee surgery"  . GERD (gastroesophageal reflux disease)   . Hypercholesteremia   . Hypertension   . Hypothyroidism   . Pneumonia    hx of  . PONV (postoperative nausea and vomiting)   . Thyroid disease     Past Surgical History:  Procedure Laterality Date  . ABDOMINAL HYSTERECTOMY     partial  . BREAST SURGERY     lumpectomy left breast  . carpel tunnel    . COLONOSCOPY W/ POLYPECTOMY    . FOOT SURGERY    . knee arhtroscopy    . SHOULDER ARTHROSCOPY WITH SUBACROMIAL DECOMPRESSION, ROTATOR CUFF REPAIR AND BICEP TENDON REPAIR Right 05/26/2012   Procedure: Right Shoulder Diagnostic Operative Arthroscopy, Subacromial Decompression, Biceps Tenodesis, Mini Open Rotator Cuff Repair;  Surgeon: [name], MD;  Location: [facility];  Service: Orthopedics;  Laterality: Right;  Right Shoulder Diagnostic Operative Arthroscopy, Subacromial Decompression, Biceps Tenodesis, Mini Open Rotator Cuff Repair  . TONSILLECTOMY    . TOTAL KNEE ARTHROPLASTY Left 07/01/2019   Procedure: LEFT TOTAL KNEE ARTHROPLASTY;  Surgeon: [name], MD;  Location: [facility];  Service: Orthopedics;  Laterality: Left;     There were no vitals filed for this visit.  Subjective Assessment - 07/26/19 1159    Subjective  The knee is hurting today. I vacummed and I think that increased pain.    Currently in Pain?  Yes    Pain Score  7     Pain Location  Knee    Pain Orientation  Left    Pain Descriptors / Indicators  Aching;Tightness         [facility] PT Assessment - 07/26/19 0001      AROM   Left Knee Flexion  88   98 AAROM after manual and hip flexor stretch                  [facility] Adult PT Treatment/Exercise - 07/26/19 0001      Knee/Hip Exercises: Stretches   Active Hamstring Stretch  2 reps;20 seconds    Active Hamstring Stretch Limitations  seated edge of mat with heel on floor   strap for DF   Hip Flexor Stretch  Left    Hip Flexor Stretch Limitations  off edge of mat , intermittent knee flexion      Knee/Hip Exercises: Aerobic   Nustep  L3 x 6 min (LE only)      Knee/Hip Exercises: Seated   Long Arc Quad  2 sets;10 reps    Long  Arc Quad Weight  2 lbs.    Heel Slides  10 reps    Heel Slides Limitations  towel on floor     Hamstring Curl  2 sets;10 reps    Hamstring Limitations  red band       Knee/Hip Exercises: Supine   Quad Sets  10 reps   5 sec hold   Quad Sets Limitations  towel roll     Heel Slides  20 reps    Heel Slides Limitations  with strap assist at end range for stretch with 5 sec hold, x 10 AROM    Straight Leg Raises  10 reps    Straight Leg Raises Limitations  cues to maintain  knee extension       Vasopneumatic   Number Minutes Vasopneumatic   10 minutes    Vasopnuematic Location   Knee    Vasopneumatic Pressure  Low    Vasopneumatic Temperature   34      Manual Therapy   Joint Mobilization  A/P mobs on Tibia in hooklying and supine with towel roll , patella mobs all planes     Soft tissue mobilization  thigh in hip flexor stretch off edge of mat -manual STW and massage roller                PT Short Term Goals - 07/16/19 1144       PT SHORT TERM GOAL #1   Title  Patient will be I with initial HEP to progress in PT    Time  4    Period  Weeks    Status  New    Target Date  08/13/19      PT SHORT TERM GOAL #2   Title  Patient will exhibit >/= 105 deg knee flexion to improve transfers    Time  4    Period  Weeks    Status  New    Target Date  08/13/19      PT SHORT TERM GOAL #3   Title  Patient will be able to walk household distances without AD or gait deviation to improve ability to perform light household tasks    Time  4    Period  Weeks    Status  New    Target Date  08/13/19      PT SHORT TERM GOAL #4   Title  Patient will report knee pain with activity </= 3/10 to reduce compensation and improve functional ability    Time  4    Period  Weeks    Status  New    Target Date  08/13/19        PT Long Term Goals - 07/16/19 1238      PT LONG TERM GOAL #1   Title  Patient will be I with final HEP to maintain progress from PT    Time  8    Period  Weeks    Status  New    Target Date  09/10/19      PT LONG TERM GOAL #2   Title  Patient will be able to walk 1 mile without AD and only a little bit of difficulty to improve community access and shopping ability    Time  8    Period  Weeks    Status  New    Target Date  09/10/19      PT LONG TERM GOAL #3   Title  Patient will exhibit left knee strength  5/5 MMT to perform heavy household tasks and negotiate stairs with little to no difficulty    Time  8    Period  Weeks    Status  New    Target Date  09/10/19      PT LONG TERM GOAL #4   Title  Patient will exhibit improved knee AROM to >/= 120 deg flexion and 0 deg extension to normalize transfers and functional movement    Time  8    Period  Weeks    Status  New    Target Date  09/10/19            Plan - 07/26/19 1212    Clinical Impression Statement  Pt enters today with increased pain after vacumming at home prior to her appointment. Continued with gentle ROM and strengthening. Able  to achieve 98AAROM after manual and hip flexor stretching.  Ended with vaso for swelling and pain. She reports vaso was helpful last session.    PT Next Visit Plan  Assess HEP and progress PRN, NuStep vs bike, manual and stretching for knee flexion motion, progress LLE strengthening, gait training to wean from cane, vaso for edema    PT Home Exercise Plan  YNBNQEDX: longsitting calf stretch, seated hamstring stretch, supine heel slide with strap, seated knee flexion stretch with other leg assist and with scoot, quad set, SLR, LAQ, standing hip abduction, extension, marching       Patient will benefit from skilled therapeutic intervention in order to improve the following deficits and impairments:     Visit Diagnosis: Stiffness of left knee, not elsewhere classified  Muscle weakness (generalized)  Other abnormalities of gait and mobility  Localized edema  Chronic pain of left knee     Problem List Patient Active Problem List   Diagnosis Date Noted  . Arthritis of left knee 07/01/2019  . B12 deficiency 03/23/2019  . Anemia 02/05/2019  . Rheumatoid arthritis with rheumatoid factor of multiple sites without organ or systems involvement ([place]) 01/25/2019  . High risk medication use 01/25/2019  . Fatigue 09/18/2018  . Routine general medical examination at a health care facility 02/07/2015  . Morbid obesity ([place]) 04/19/2013  . Hyperlipidemia associated with type 2 diabetes mellitus ([place]) 07/28/2009  . Osteopenia 03/14/2009  . Diabetes mellitus type 2 with complications ([place]) 0000000  . Hypothyroidism 09/24/2006  . Essential hypertension 09/24/2006    Dorene Ar, PTA 07/26/2019, 12:56 PM  [facility] [address] [place], [place], [address] Phone: [phone]   Fax:  [phone]  Name: Marissa Roberts MRN: [id number] Date of Birth: 08-Nov-1945

## 2019-07-26 NOTE — Telephone Encounter (Signed)
Last Visit: 04/07/2019 Next Visit: 08/09/2019 Labs: 06/22/2019 WBC 12.3, hemoglobin 11.0, HCT 35.7, MCH 25.4, RDW 17.5.  Eye exam: 12/15/2018   Okay to refill plaquenil?

## 2019-07-26 NOTE — Telephone Encounter (Signed)
Ok to refill PLQ.

## 2019-07-29 ENCOUNTER — Ambulatory Visit: Payer: Medicare Other | Admitting: Physical Therapy

## 2019-07-29 ENCOUNTER — Other Ambulatory Visit: Payer: Self-pay

## 2019-07-29 ENCOUNTER — Encounter: Payer: Self-pay | Admitting: Physical Therapy

## 2019-07-29 DIAGNOSIS — R2689 Other abnormalities of gait and mobility: Secondary | ICD-10-CM | POA: Diagnosis not present

## 2019-07-29 DIAGNOSIS — M6281 Muscle weakness (generalized): Secondary | ICD-10-CM | POA: Diagnosis not present

## 2019-07-29 DIAGNOSIS — M25562 Pain in left knee: Secondary | ICD-10-CM | POA: Diagnosis not present

## 2019-07-29 DIAGNOSIS — M25662 Stiffness of left knee, not elsewhere classified: Secondary | ICD-10-CM

## 2019-07-29 DIAGNOSIS — R600 Localized edema: Secondary | ICD-10-CM | POA: Diagnosis not present

## 2019-07-29 DIAGNOSIS — G8929 Other chronic pain: Secondary | ICD-10-CM | POA: Diagnosis not present

## 2019-07-29 NOTE — Therapy (Signed)
[place], [place], [address] Phone: [phone]   Fax:  [phone]  Physical Therapy Treatment  Patient Details  Name: Marissa Roberts MRN: [id number] Date of Birth: 01/15/1946 Referring Provider (PT): Donella Stade, [place]   Encounter Date: 07/29/2019  PT End of Session - 07/29/19 1104    Visit Number  5    Number of Visits  16    Date for PT Re-Evaluation  09/10/19    Authorization Type  [employer] MCR    PT Start Time  1100    PT Stop Time  1142    PT Time Calculation (min)  42 min       Past Medical History:  Diagnosis Date  . Arthritis   . Bronchitis    hx of  . Diabetes mellitus   . Family history of adverse reaction to anesthesia    per patient, "daughter threw up after a knee surgery"  . GERD (gastroesophageal reflux disease)   . Hypercholesteremia   . Hypertension   . Hypothyroidism   . Pneumonia    hx of  . PONV (postoperative nausea and vomiting)   . Thyroid disease     Past Surgical History:  Procedure Laterality Date  . ABDOMINAL HYSTERECTOMY     partial  . BREAST SURGERY     lumpectomy left breast  . carpel tunnel    . COLONOSCOPY W/ POLYPECTOMY    . FOOT SURGERY    . knee arhtroscopy    . SHOULDER ARTHROSCOPY WITH SUBACROMIAL DECOMPRESSION, ROTATOR CUFF REPAIR AND BICEP TENDON REPAIR Right 05/26/2012   Procedure: Right Shoulder Diagnostic Operative Arthroscopy, Subacromial Decompression, Biceps Tenodesis, Mini Open Rotator Cuff Repair;  Surgeon: [name], MD;  Location: [facility];  Service: Orthopedics;  Laterality: Right;  Right Shoulder Diagnostic Operative Arthroscopy, Subacromial Decompression, Biceps Tenodesis, Mini Open Rotator Cuff Repair  . TONSILLECTOMY    . TOTAL KNEE ARTHROPLASTY Left 07/01/2019   Procedure: LEFT TOTAL KNEE ARTHROPLASTY;  Surgeon: [name], MD;  Location: [facility];  Service: Orthopedics;  Laterality: Left;    There were no vitals  filed for this visit.  Subjective Assessment - 07/29/19 1104    Subjective  Drove myself today. Did not bring my cane.    Currently in Pain?  Yes    Pain Score  5     Pain Location  Knee    Pain Orientation  Left    Pain Descriptors / Indicators  Burning    Pain Type  Surgical pain    Aggravating Factors   standing and walking    Pain Relieving Factors  ice, meds, rest, elevation         [facility] PT Assessment - 07/29/19 0001      AROM   Left Knee Flexion  105                   [facility] Adult PT Treatment/Exercise - 07/29/19 0001      Knee/Hip Exercises: Stretches   Active Hamstring Stretch  2 reps;20 seconds    Hip Flexor Stretch  Left    Hip Flexor Stretch Limitations  off edge of mat , intermittent knee flexion    Knee: Self-Stretch Limitations  Seated scoot stretch for knee flexion     Gastroc Stretch  2 reps;20 seconds    Gastroc Stretch Limitations  longsitting with strap      Knee/Hip Exercises: Aerobic   Recumbent Bike  declined  Nustep  L3 x 6 min (LE only)      Knee/Hip Exercises: Standing   Heel Raises  10 reps    Knee Flexion  10 reps    Hip Flexion  10 reps    Hip Flexion Limitations  bilat x 10 each     Hip Abduction  Both;1 set;10 reps    Abduction Limitations  BUE support    Hip Extension  Right;Left;10 reps    Extension Limitations  BUE support    Functional Squat  10 reps      Knee/Hip Exercises: Seated   Long Arc Quad  2 sets;20 reps    Long Arc Quad Weight  3 lbs.    Hamstring Curl  2 sets;10 reps    Hamstring Limitations  green band       Knee/Hip Exercises: Supine   Quad Sets  10 reps   5 sec hold   Straight Leg Raises  10 reps      Vasopneumatic   Number Minutes Vasopneumatic   10 minutes    Vasopnuematic Location   Knee    Vasopneumatic Pressure  Low    Vasopneumatic Temperature   34      Manual Therapy   Joint Mobilization  patella mobs               PT Short Term Goals - 07/16/19 1144      PT SHORT TERM  GOAL #1   Title  Patient will be I with initial HEP to progress in PT    Time  4    Period  Weeks    Status  New    Target Date  08/13/19      PT SHORT TERM GOAL #2   Title  Patient will exhibit >/= 105 deg knee flexion to improve transfers    Time  4    Period  Weeks    Status  New    Target Date  08/13/19      PT SHORT TERM GOAL #3   Title  Patient will be able to walk household distances without AD or gait deviation to improve ability to perform light household tasks    Time  4    Period  Weeks    Status  New    Target Date  08/13/19      PT SHORT TERM GOAL #4   Title  Patient will report knee pain with activity </= 3/10 to reduce compensation and improve functional ability    Time  4    Period  Weeks    Status  New    Target Date  08/13/19        PT Long Term Goals - 07/16/19 1238      PT LONG TERM GOAL #1   Title  Patient will be I with final HEP to maintain progress from PT    Time  8    Period  Weeks    Status  New    Target Date  09/10/19      PT LONG TERM GOAL #2   Title  Patient will be able to walk 1 mile without AD and only a little bit of difficulty to improve community access and shopping ability    Time  8    Period  Weeks    Status  New    Target Date  09/10/19      PT LONG TERM GOAL #3   Title  Patient will exhibit left  knee strength 5/5 MMT to perform heavy household tasks and negotiate stairs with little to no difficulty    Time  8    Period  Weeks    Status  New    Target Date  09/10/19      PT LONG TERM GOAL #4   Title  Patient will exhibit improved knee AROM to >/= 120 deg flexion and 0 deg extension to normalize transfers and functional movement    Time  8    Period  Weeks    Status  New    Target Date  09/10/19            Plan - 07/29/19 1139    Clinical Impression Statement  Pt arrives without AD and has retruned to driving. Good improvement in ROM, measuring 0-105 left knee. She is reluctant to try recumbent bike for ROM.  She had increased pain with stretching. Vaso at end of session to decrease pain and inflammation    PT Next Visit Plan  Begin stairs, try bike if patient is willing; Assess HEP and progress PRN, NuStep vs bike, manual and stretching for knee flexion motion, progress LLE strengthening, gait training to wean from cane, vaso for edema    PT Home Exercise Plan  YNBNQEDX: longsitting calf stretch, seated hamstring stretch, supine heel slide with strap, seated knee flexion stretch with other leg assist and with scoot, quad set, SLR, LAQ, standing hip abduction, extension, marching       Patient will benefit from skilled therapeutic intervention in order to improve the following deficits and impairments:  Abnormal gait, Decreased range of motion, Difficulty walking, Decreased activity tolerance, Pain, Decreased balance, Improper body mechanics, Decreased strength, Increased edema, Postural dysfunction  Visit Diagnosis: Stiffness of left knee, not elsewhere classified  Muscle weakness (generalized)  Localized edema  Other abnormalities of gait and mobility     Problem List Patient Active Problem List   Diagnosis Date Noted  . Arthritis of left knee 07/01/2019  . B12 deficiency 03/23/2019  . Anemia 02/05/2019  . Rheumatoid arthritis with rheumatoid factor of multiple sites without organ or systems involvement ([place]) 01/25/2019  . High risk medication use 01/25/2019  . Fatigue 09/18/2018  . Routine general medical examination at a health care facility 02/07/2015  . Morbid obesity ([place]) 04/19/2013  . Hyperlipidemia associated with type 2 diabetes mellitus ([place]) 07/28/2009  . Osteopenia 03/14/2009  . Diabetes mellitus type 2 with complications ([place]) 0000000  . Hypothyroidism 09/24/2006  . Essential hypertension 09/24/2006    Dorene Ar, PTA 07/29/2019, 11:54 AM  [facility] [address] [place], [place], [address] Phone:  [phone]   Fax:  [phone]  Name: Marissa Roberts MRN: [id number] Date of Birth: Jul 03, 1945

## 2019-08-02 ENCOUNTER — Ambulatory Visit: Payer: Medicare Other | Attending: Surgical | Admitting: Physical Therapy

## 2019-08-02 ENCOUNTER — Encounter: Payer: Self-pay | Admitting: Physical Therapy

## 2019-08-02 ENCOUNTER — Other Ambulatory Visit: Payer: Self-pay

## 2019-08-02 DIAGNOSIS — R2689 Other abnormalities of gait and mobility: Secondary | ICD-10-CM | POA: Insufficient documentation

## 2019-08-02 DIAGNOSIS — R600 Localized edema: Secondary | ICD-10-CM | POA: Diagnosis not present

## 2019-08-02 DIAGNOSIS — M6281 Muscle weakness (generalized): Secondary | ICD-10-CM | POA: Insufficient documentation

## 2019-08-02 DIAGNOSIS — M25562 Pain in left knee: Secondary | ICD-10-CM | POA: Insufficient documentation

## 2019-08-02 DIAGNOSIS — G8929 Other chronic pain: Secondary | ICD-10-CM | POA: Insufficient documentation

## 2019-08-02 DIAGNOSIS — M25662 Stiffness of left knee, not elsewhere classified: Secondary | ICD-10-CM | POA: Diagnosis not present

## 2019-08-02 NOTE — Therapy (Signed)
[place], [place], [address] Phone: [phone]   Fax:  [phone]  Physical Therapy Treatment  Patient Details  Name: Marissa Roberts MRN: [id number] Date of Birth: Sep 16, 1945 Referring Provider (PT): Donella Stade, [place]   Encounter Date: 08/02/2019  PT End of Session - 08/02/19 1053    Visit Number  6    Number of Visits  16    Date for PT Re-Evaluation  09/10/19    Authorization Type  [employer] MCR    PT Start Time  1048    PT Stop Time  1140    PT Time Calculation (min)  52 min    Activity Tolerance  Patient tolerated treatment well    Behavior During Therapy  [facility] for tasks assessed/performed       Past Medical History:  Diagnosis Date  . Arthritis   . Bronchitis    hx of  . Diabetes mellitus   . Family history of adverse reaction to anesthesia    per patient, "daughter threw up after a knee surgery"  . GERD (gastroesophageal reflux disease)   . Hypercholesteremia   . Hypertension   . Hypothyroidism   . Pneumonia    hx of  . PONV (postoperative nausea and vomiting)   . Thyroid disease     Past Surgical History:  Procedure Laterality Date  . ABDOMINAL HYSTERECTOMY     partial  . BREAST SURGERY     lumpectomy left breast  . carpel tunnel    . COLONOSCOPY W/ POLYPECTOMY    . FOOT SURGERY    . knee arhtroscopy    . SHOULDER ARTHROSCOPY WITH SUBACROMIAL DECOMPRESSION, ROTATOR CUFF REPAIR AND BICEP TENDON REPAIR Right 05/26/2012   Procedure: Right Shoulder Diagnostic Operative Arthroscopy, Subacromial Decompression, Biceps Tenodesis, Mini Open Rotator Cuff Repair;  Surgeon: [name], MD;  Location: [place];  Service: Orthopedics;  Laterality: Right;  Right Shoulder Diagnostic Operative Arthroscopy, Subacromial Decompression, Biceps Tenodesis, Mini Open Rotator Cuff Repair  . TONSILLECTOMY    . TOTAL KNEE ARTHROPLASTY Left 07/01/2019   Procedure: LEFT TOTAL KNEE ARTHROPLASTY;   Surgeon: [name], MD;  Location: [facility];  Service: Orthopedics;  Laterality: Left;    There were no vitals filed for this visit.  Subjective Assessment - 08/02/19 1048    Subjective  Patient reports her knee is a little more sore this visit. She thinks this may be because of the weather.    Patient Stated Goals  Patient wants to walk without pain, get down and get up when she wants, act spontaneous without pain or fear    Currently in Pain?  Yes    Pain Score  5     Pain Location  Knee    Pain Orientation  Left    Pain Descriptors / Indicators  Aching;Sore;Tightness    Pain Type  Surgical pain    Pain Onset  More than a month ago    Pain Frequency  Constant    Aggravating Factors   Standing and walking, bending the knee         [facility] PT Assessment - 08/02/19 0001      AROM   Left Knee Flexion  102                   [facility] Adult PT Treatment/Exercise - 08/02/19 0001      Exercises   Exercises  Knee/Hip      Knee/Hip Exercises: Stretches  Passive Hamstring Stretch  2 reps;20 seconds    Hip Flexor Stretch  3 reps;20 seconds    Hip Flexor Stretch Limitations  supine edge of mat    Gastroc Stretch  2 reps;20 seconds    Gastroc Stretch Limitations  slant board      Knee/Hip Exercises: Aerobic   Nustep  L5 x 5 min (LE only)      Knee/Hip Exercises: Standing   Heel Raises  2 sets;10 reps    Hip Abduction  2 sets;10 reps    Forward Step Up  2 sets;10 reps    Forward Step Up Limitations  6" step height, single UE support    Functional Squat  2 sets;10 reps    Functional Squat Limitations  BUE counter support      Knee/Hip Exercises: Seated   Long Arc Quad  2 sets;20 reps      Modalities   Modalities  Vasopneumatic   post therapy     Vasopneumatic   Number Minutes Vasopneumatic   10 minutes    Vasopnuematic Location   Knee    Vasopneumatic Pressure  Medium    Vasopneumatic Temperature   34      Manual Therapy   Manual Therapy  Joint  mobilization;Passive ROM;Soft tissue mobilization    Manual therapy comments  Focus on improving knee motion    Joint Mobilization  Left tibiofemoral and patellofemoral joint mobs    Soft tissue mobilization  Roller to left quad and hip flexor region    Passive ROM  Left knee flexion and extension in supine and seated position             PT Education - 08/02/19 1052    Education Details  HEP    Person(s) Educated  Patient    Methods  Explanation;Demonstration;Tactile cues;Verbal cues;Handout    Comprehension  Verbalized understanding;Returned demonstration;Verbal cues required;Tactile cues required;Need further instruction       PT Short Term Goals - 08/02/19 1125      PT SHORT TERM GOAL #1   Title  Patient will be I with initial HEP to progress in PT    Time  4    Period  Weeks    Status  On-going    Target Date  08/13/19      PT SHORT TERM GOAL #2   Title  Patient will exhibit >/= 105 deg knee flexion to improve transfers    Time  4    Period  Weeks    Status  On-going    Target Date  08/13/19      PT SHORT TERM GOAL #3   Title  Patient will be able to walk household distances without AD or gait deviation to improve ability to perform light household tasks    Time  4    Period  Weeks    Status  Achieved      PT SHORT TERM GOAL #4   Title  Patient will report knee pain with activity </= 3/10 to reduce compensation and improve functional ability    Time  4    Period  Weeks    Status  On-going    Target Date  08/13/19        PT Long Term Goals - 07/16/19 1238      PT LONG TERM GOAL #1   Title  Patient will be I with final HEP to maintain progress from PT    Time  8    Period  Weeks    Status  New    Target Date  09/10/19      PT LONG TERM GOAL #2   Title  Patient will be able to walk 1 mile without AD and only a little bit of difficulty to improve community access and shopping ability    Time  8    Period  Weeks    Status  New    Target Date   09/10/19      PT LONG TERM GOAL #3   Title  Patient will exhibit left knee strength 5/5 MMT to perform heavy household tasks and negotiate stairs with little to no difficulty    Time  8    Period  Weeks    Status  New    Target Date  09/10/19      PT LONG TERM GOAL #4   Title  Patient will exhibit improved knee AROM to >/= 120 deg flexion and 0 deg extension to normalize transfers and functional movement    Time  8    Period  Weeks    Status  New    Target Date  09/10/19            Plan - 08/02/19 1053    Clinical Impression Statement  Patient tolerated therapy well with no adverse effects. She did exhibit an increase in stiffness this visit with increased tightness/discomfort with stretching of the anterior knee and proximal quad region. She did well with addition of step-up exercises. Vaso at end of therapy to reduce pain with good benefit. She would benefit from continued skilled PT to progress motion and strength to maximize functional mobility.    PT Treatment/Interventions  ADLs/Self Care Home Management;Cryotherapy;Electrical Stimulation;Moist Heat;Neuromuscular re-education;Balance training;Therapeutic exercise;Therapeutic activities;Functional mobility training;Stair training;Gait training;Patient/family education;Manual techniques;Dry needling;Passive range of motion;Taping;Joint Manipulations;Vasopneumatic Device    PT Next Visit Plan  Begin stairs, try bike if patient is willing; Assess HEP and progress PRN, NuStep vs bike, manual and stretching for knee flexion motion, progress LLE strengthening, gait training to wean from cane, vaso for edema    PT Home Exercise Plan  [id number]: longsitting calf stretch, seated hamstring stretch, supine heel slide with strap, seated knee flexion stretch with other leg assist and with scoot, quad set, SLR, LAQ, standing hip abduction, extension, marching    Consulted and Agree with Plan of Care  Patient       Patient will benefit from  skilled therapeutic intervention in order to improve the following deficits and impairments:  Abnormal gait, Decreased range of motion, Difficulty walking, Decreased activity tolerance, Pain, Decreased balance, Improper body mechanics, Decreased strength, Increased edema, Postural dysfunction  Visit Diagnosis: Stiffness of left knee, not elsewhere classified  Muscle weakness (generalized)  Localized edema  Other abnormalities of gait and mobility  Chronic pain of left knee     Problem List Patient Active Problem List   Diagnosis Date Noted  . Arthritis of left knee 07/01/2019  . B12 deficiency 03/23/2019  . Anemia 02/05/2019  . 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 High risk medication use 01/25/2019  . Fatigue 09/18/2018  . Routine general medical examination at a health care facility 02/07/2015  . Morbid obesity ([place]) 04/19/2013  . Hyperlipidemia associated with type 2 diabetes mellitus ([place]) 07/28/2009  . Osteopenia 03/14/2009  . Diabetes mellitus type 2 with complications ([place]) 0000000  . Hypothyroidism 09/24/2006  . Essential hypertension 09/24/2006    Megan Salon  Owens Shark, PT, DPT, LAT, ATC 08/02/19  11:47 AM Phone: [phone] Fax: [facility] [address] [place], [place], [address] Phone: [phone]   Fax:  [phone]  Name: ARHIANA BOBIER MRN: [id number] Date of Birth: 24-Feb-1946

## 2019-08-02 NOTE — Progress Notes (Signed)
Office Visit Note  Patient: Marissa Roberts             Date of Birth: [birth year]           MRN: [id number]             PCP: [name], MD Referring: [name], * Visit Date: 08/09/2019 Occupation: @GUAROCC @  Subjective:  Left knee replacement discomfort  History of Present Illness: Marissa Roberts is a 74 y.o. female with history of seropositive rheumatoid arthritis and osteoarthritis.  Patient is taking Plaquenil 200 mg BID and methotrexate 7 tablets by mouth once weekly.  According to the patient she missed her dose of methotrexate on Friday but denies any increasing pain or joint swelling.  She denies any recent flares.  She states that she had her left knee joint replaced by Dr. Marlou Sa on 07/01/2019 he continues to have pain, stiffness, and warmth.  She has been to physical therapy twice a week.  She was taking oxycodone for pain relief and plans on transitioning to Tylenol as needed.    Activities of Daily Living:  Patient reports morning stiffness for 0 none.   Patient Denies nocturnal pain.  Difficulty dressing/grooming: Denies Difficulty climbing stairs: Denies Difficulty getting out of chair: Denies Difficulty using hands for taps, buttons, cutlery, and/or writing: Denies  Review of Systems  Constitutional: Positive for fatigue.  HENT: Positive for mouth dryness. Negative for mouth sores and nose dryness.   Eyes: Negative for pain, visual disturbance and dryness.  Respiratory: Negative for cough, hemoptysis, shortness of breath and difficulty breathing.   Cardiovascular: Negative for chest pain, palpitations, hypertension and swelling in legs/feet.  Gastrointestinal: Positive for constipation. Negative for blood in stool and diarrhea.  Endocrine: Negative for excessive thirst and increased urination.  Genitourinary: Negative for difficulty urinating and painful urination.  Musculoskeletal: Positive for arthralgias, joint pain, joint swelling,  muscle weakness and muscle tenderness. Negative for myalgias, morning stiffness and myalgias.  Skin: Negative for color change, pallor, rash, hair loss, nodules/bumps, skin tightness, ulcers and sensitivity to sunlight.  Allergic/Immunologic: Negative for susceptible to infections.  Neurological: Negative for dizziness, numbness and headaches.  Hematological: Negative for bruising/bleeding tendency and swollen glands.  Psychiatric/Behavioral: Negative for depressed mood and sleep disturbance. The patient is not nervous/anxious.     PMFS History:  Patient Active Problem List   Diagnosis Date Noted  . Arthritis of left knee 07/01/2019  . B12 deficiency 03/23/2019  . Anemia 02/05/2019  . Rheumatoid arthritis with rheumatoid factor of multiple sites without organ or systems involvement ([place]) 01/25/2019  . High risk medication use 01/25/2019  . Fatigue 09/18/2018  . Routine general medical examination at a health care facility 02/07/2015  . Morbid obesity ([place]) 04/19/2013  . Hyperlipidemia associated with type 2 diabetes mellitus ([place]) 07/28/2009  . Osteopenia 03/14/2009  . Diabetes mellitus type 2 with complications ([place]) 0000000  . Hypothyroidism 09/24/2006  . Essential hypertension 09/24/2006    Past Medical History:  Diagnosis Date  . Arthritis   . Bronchitis    hx of  . Diabetes mellitus   . Family history of adverse reaction to anesthesia    per patient, "daughter threw up after a knee surgery"  . GERD (gastroesophageal reflux disease)   . Hypercholesteremia   . Hypertension   . Hypothyroidism   . Pneumonia    hx of  . PONV (postoperative nausea and vomiting)   . Thyroid disease     Family History  Problem Relation Age of Onset  . Diabetes Mother   . Heart disease Mother   . Hyperlipidemia Mother   . Diabetes Father   . Diabetes Sister   . Hypertension Sister   . Heart disease Sister   . Dementia Sister   . Heart disease Brother   . Healthy Daughter   .  Healthy Daughter   . Healthy Daughter   . Healthy Son    Past Surgical History:  Procedure Laterality Date  . ABDOMINAL HYSTERECTOMY     partial  . BREAST SURGERY     lumpectomy left breast  . carpel tunnel    . COLONOSCOPY W/ POLYPECTOMY    . FOOT SURGERY    . knee arhtroscopy    . KNEE ARTHROPLASTY    . SHOULDER ARTHROSCOPY WITH SUBACROMIAL DECOMPRESSION, ROTATOR CUFF REPAIR AND BICEP TENDON REPAIR Right 05/26/2012   Procedure: Right Shoulder Diagnostic Operative Arthroscopy, Subacromial Decompression, Biceps Tenodesis, Mini Open Rotator Cuff Repair;  Surgeon: [name], MD;  Location: [facility];  Service: Orthopedics;  Laterality: Right;  Right Shoulder Diagnostic Operative Arthroscopy, Subacromial Decompression, Biceps Tenodesis, Mini Open Rotator Cuff Repair  . TONSILLECTOMY    . TOTAL KNEE ARTHROPLASTY Left 07/01/2019   Procedure: LEFT TOTAL KNEE ARTHROPLASTY;  Surgeon: [name], MD;  Location: [facility];  Service: Orthopedics;  Laterality: Left;   Social History   Social History Narrative  . Not on file   Immunization History  Administered Date(s) Administered  . Fluad Quad(high Dose 65+) 01/02/2019  . Influenza Split 01/03/2011  . Influenza Whole 04/01/2005, 12/18/2009  . Influenza, High Dose Seasonal PF 01/07/2014, 01/27/2015, 01/02/2016, 01/13/2017, 01/16/2018  . Influenza-Unspecified 11/30/2012  . PFIZER SARS-COV-2 Vaccination 05/28/2019, 06/23/2019  . Pneumococcal Conjugate-13 09/12/2016, 01/02/2019  . Pneumococcal Polysaccharide-23 07/15/2014  . Td 04/01/2001, 10/07/2013     Objective: Vital Signs: BP 116/67 (BP Location: Left Arm, Patient Position: Sitting, Cuff Size: Normal)   Pulse 75   Resp 18   Ht 5\' 5"  (1.651 m)   Wt 228 lb 6.4 oz (103.6 kg)   BMI 38.01 kg/m    Physical Exam Vitals and nursing note reviewed.  Constitutional:      Appearance: She is well-developed.  HENT:     Head: Normocephalic and atraumatic.  Eyes:      Conjunctiva/sclera: Conjunctivae normal.  Pulmonary:     Effort: Pulmonary effort is normal.  Abdominal:     General: Bowel sounds are normal.     Palpations: Abdomen is soft.  Musculoskeletal:     Cervical back: Normal range of motion.  Lymphadenopathy:     Cervical: No cervical adenopathy.  Skin:    General: Skin is warm and dry.     Capillary Refill: Capillary refill takes less than 2 seconds.  Neurological:     Mental Status: She is alert and oriented to person, place, and time.  Psychiatric:        Behavior: Behavior normal.      Musculoskeletal Exam: C-spine, thoracic spine, lumbar spine good range of motion.  Shoulder joints, elbow joints, wrist joints, MCPs, PIPs and DIPs good range of motion no synovitis.  She has PIP and DIP thickening consistent with osteoarthritis of both hands.  She has complete fist formation bilaterally.  Hip joints have good range of motion with no discomfort.  Right knee has good range of motion no warmth or effusion.  Left knee replacement has slightly limited flexion and extension with warmth.  Ankle joints have good  range of motion with no tenderness.  She has pedal edema bilaterally.  CDAI Exam: CDAI Score: -- Patient Global: --; Provider Global: -- Swollen: --; Tender: -- Joint Exam 08/09/2019   No joint exam has been documented for this visit   There is currently no information documented on the homunculus. Go to the Rheumatology activity and complete the homunculus joint exam.  Investigation: No additional findings.  Imaging: No results found.  Recent Labs: Lab Results  Component Value Date   WBC 12.3 (H) 06/22/2019   HGB 11.0 (L) 06/22/2019   PLT 378 06/22/2019   NA 138 06/22/2019   K 3.8 06/22/2019   CL 102 06/22/2019   CO2 25 06/22/2019   GLUCOSE 95 06/22/2019   BUN 12 06/22/2019   CREATININE 0.77 06/22/2019   BILITOT 0.3 05/25/2019   ALKPHOS 41 03/23/2019   AST 16 05/25/2019   ALT 14 05/25/2019   PROT 6.5 05/25/2019    ALBUMIN 4.1 03/23/2019   CALCIUM 9.7 06/22/2019   GFRAA >60 06/22/2019   QFTBGOLDPLUS NEGATIVE 12/24/2018    Speciality Comments: PLQ Eye Exam: 12/15/18 WNL @ [facility] follow up in 6 months  Procedures:  No procedures performed Allergies: Contrast media [iodinated diagnostic agents], Adhesive [tape], Erythromycin, and Penicillins    Assessment / Plan:     Visit Diagnoses: Rheumatoid arthritis involving multiple sites with positive rheumatoid factor ([place]): She has no synovitis or tenderness on exam.  She has not had any recent rheumatoid arthritis flares.  She is clinically doing well on Plaquenil 200 mg 1 tablet by mouth twice daily, methotrexate 7 tablets by mouth once weekly, and folic acid 2 mg by mouth daily.  She is experiencing discomfort in the left knee which was replaced by Dr. Marlou Sa on 07/01/2019.  She is not experiencing any other joint pain or inflammation at this time.  She will continue taking Plaquenil and methotrexate as prescribed.  She was advised to notify us if she develops increased joint pain or joint swelling.  She will follow-up in the office in 5 months.  High risk medication use - Plaquenil 200 mg 1 tablet by mouth twice daily, MTX 7 tablets po once weekly, and folic acid 2 mg po daily. Last Plaquenil eye exam normal on 12/15/2018. CBC and BMP were drawn on 06/22/2019.  She is to update CBC and CMP today.  Orders were released.  She will return for lab work in August and every 3 months to monitor for drug toxicity. - Plan: CBC with Differential/Platelet, COMPLETE METABOLIC PANEL WITH GFR  Primary osteoarthritis of both feet: She is not experiencing any discomfort in her feet at this time.  She wears proper fitting shoes.   S/P total knee replacement, left - Performed by Dr. Marlou Sa on 07/01/19.  She has been going to physical therapy twice a week.  She continues to have discomfort and warmth.  She was previously taking oxycodone as needed for pain relief but would like  to try tylenol as needed for pain relief.   Other medical conditions are listed as follows:   Osteopenia, unspecified location: She is taking a calcium and vitamin D supplement.   Diabetes mellitus type 2 with complications ([place])  Hyperlipidemia associated with type 2 diabetes mellitus ([place])  Essential hypertension  History of hypothyroidism  Orders: Orders Placed This Encounter  Procedures  . CBC with Differential/Platelet  . COMPLETE METABOLIC PANEL WITH GFR   No orders of the defined types were placed in this encounter.  Follow-Up Instructions: Return in about 5 months (around 01/09/2020) for Rheumatoid arthritis, Osteoarthritis.   [name], PA-C  I examined and evaluated the patient with [name] PA.  Patient had no synovitis on examination today.  She continues to have some discomfort in her left knee joint which is replaced and is warm to touch.  She has been taking Tylenol on as needed basis.  She will continue current regimen.  She states she is missed her dose of methotrexate last week.  She will resume methotrexate on Friday.  The plan of care was discussed as noted above.  Bo Merino, MD  Note - This record has been created using Editor, commissioning.  Chart creation errors have been sought, but may not always  have been located. Such creation errors do not reflect on  the standard of medical care.

## 2019-08-03 ENCOUNTER — Telehealth: Payer: Self-pay | Admitting: Surgical

## 2019-08-03 NOTE — Telephone Encounter (Signed)
Patient called. She had a question about the gabapentin. She says she has 3 more left and was not sure if she should get a refill or not. She would like for someone to call and let her know if she needs to continue taking the gabapentin. Her call back number is [phone]

## 2019-08-03 NOTE — Telephone Encounter (Signed)
Pls advise. Thanks.  

## 2019-08-04 NOTE — Telephone Encounter (Signed)
Not necessary to take but if she feels that it helps her pain I can send in a refill.  If she doesn't notice any difference, no need for refill

## 2019-08-04 NOTE — Telephone Encounter (Signed)
IC s/w patient. She has not noticed a difference taking it. She will see how she does without it.  She will let us know.

## 2019-08-06 ENCOUNTER — Ambulatory Visit: Payer: Medicare Other | Admitting: Physical Therapy

## 2019-08-06 ENCOUNTER — Other Ambulatory Visit: Payer: Self-pay

## 2019-08-06 ENCOUNTER — Encounter: Payer: Self-pay | Admitting: Physical Therapy

## 2019-08-06 DIAGNOSIS — G8929 Other chronic pain: Secondary | ICD-10-CM

## 2019-08-06 DIAGNOSIS — R600 Localized edema: Secondary | ICD-10-CM | POA: Diagnosis not present

## 2019-08-06 DIAGNOSIS — M6281 Muscle weakness (generalized): Secondary | ICD-10-CM

## 2019-08-06 DIAGNOSIS — R2689 Other abnormalities of gait and mobility: Secondary | ICD-10-CM | POA: Diagnosis not present

## 2019-08-06 DIAGNOSIS — M25662 Stiffness of left knee, not elsewhere classified: Secondary | ICD-10-CM | POA: Diagnosis not present

## 2019-08-06 DIAGNOSIS — M25562 Pain in left knee: Secondary | ICD-10-CM | POA: Diagnosis not present

## 2019-08-06 NOTE — Therapy (Signed)
[place], [place], [address] Phone: [phone]   Fax:  [phone]  Physical Therapy Treatment  Patient Details  Name: Marissa Roberts MRN: [id number] Date of Birth: September 14, 1945 Referring Provider (PT): Donella Stade, [place]   Encounter Date: 08/06/2019  PT End of Session - 08/06/19 1105    Visit Number  7    Number of Visits  16    Date for PT Re-Evaluation  09/10/19    Authorization Type  BCBS MCR    PT Start Time  1101    PT Stop Time  1140    PT Time Calculation (min)  39 min    Activity Tolerance  Patient tolerated treatment well    Behavior During Therapy  [facility] for tasks assessed/performed       Past Medical History:  Diagnosis Date  . Arthritis   . Bronchitis    hx of  . Diabetes mellitus   . Family history of adverse reaction to anesthesia    per patient, "daughter threw up after a knee surgery"  . GERD (gastroesophageal reflux disease)   . Hypercholesteremia   . Hypertension   . Hypothyroidism   . Pneumonia    hx of  . PONV (postoperative nausea and vomiting)   . Thyroid disease     Past Surgical History:  Procedure Laterality Date  . ABDOMINAL HYSTERECTOMY     partial  . BREAST SURGERY     lumpectomy left breast  . carpel tunnel    . COLONOSCOPY W/ POLYPECTOMY    . FOOT SURGERY    . knee arhtroscopy    . SHOULDER ARTHROSCOPY WITH SUBACROMIAL DECOMPRESSION, ROTATOR CUFF REPAIR AND BICEP TENDON REPAIR Right 05/26/2012   Procedure: Right Shoulder Diagnostic Operative Arthroscopy, Subacromial Decompression, Biceps Tenodesis, Mini Open Rotator Cuff Repair;  Surgeon: [name], MD;  Location: [place];  Service: Orthopedics;  Laterality: Right;  Right Shoulder Diagnostic Operative Arthroscopy, Subacromial Decompression, Biceps Tenodesis, Mini Open Rotator Cuff Repair  . TONSILLECTOMY    . TOTAL KNEE ARTHROPLASTY Left 07/01/2019   Procedure: LEFT TOTAL KNEE ARTHROPLASTY;   Surgeon: [name], MD;  Location: [place];  Service: Orthopedics;  Laterality: Left;    There were no vitals filed for this visit.  Subjective Assessment - 08/06/19 1104    Subjective  Patient reports she is doing well.    Patient Stated Goals  Patient wants to walk without pain, get down and get up when she wants, act spontaneous without pain or fear    Currently in Pain?  Yes    Pain Score  2     Pain Location  Knee    Pain Orientation  Left    Pain Descriptors / Indicators  Sore;Aching    Pain Type  Surgical pain    Pain Onset  More than a month ago    Pain Frequency  Intermittent                       [facility] Adult PT Treatment/Exercise - 08/06/19 0001      Exercises   Exercises  Knee/Hip      Knee/Hip Exercises: Stretches   Passive Hamstring Stretch  2 reps;20 seconds    Passive Hamstring Stretch Limitations  seated edge of chair    Gastroc Stretch  2 reps;20 seconds    Gastroc Stretch Limitations  slant board      Knee/Hip Exercises: Aerobic   Recumbent  Bike  x5 min working on knee motion (fwd and bwd)      Knee/Hip Exercises: Standing   Heel Raises  2 sets;10 reps    Hip Abduction  2 sets;10 reps    Forward Step Up  2 sets;10 reps    Forward Step Up Limitations  8" step height, single UE support    Functional Squat  2 sets;10 reps    Functional Squat Limitations  BUE counter support      Modalities   Modalities  Vasopneumatic   post therapy     Vasopneumatic   Number Minutes Vasopneumatic   10 minutes    Vasopnuematic Location   Knee    Vasopneumatic Pressure  Medium    Vasopneumatic Temperature   34             PT Education - 08/06/19 1105    Education Details  HEP, stretching    Person(s) Educated  Patient    Methods  Explanation;Demonstration;Tactile cues;Verbal cues    Comprehension  Verbalized understanding;Returned demonstration;Verbal cues required;Tactile cues required;Need further instruction       PT Short Term  Goals - 08/02/19 1125      PT SHORT TERM GOAL #1   Title  Patient will be I with initial HEP to progress in PT    Time  4    Period  Weeks    Status  On-going    Target Date  08/13/19      PT SHORT TERM GOAL #2   Title  Patient will exhibit >/= 105 deg knee flexion to improve transfers    Time  4    Period  Weeks    Status  On-going    Target Date  08/13/19      PT SHORT TERM GOAL #3   Title  Patient will be able to walk household distances without AD or gait deviation to improve ability to perform light household tasks    Time  4    Period  Weeks    Status  Achieved      PT SHORT TERM GOAL #4   Title  Patient will report knee pain with activity </= 3/10 to reduce compensation and improve functional ability    Time  4    Period  Weeks    Status  On-going    Target Date  08/13/19        PT Long Term Goals - 07/16/19 1238      PT LONG TERM GOAL #1   Title  Patient will be I with final HEP to maintain progress from PT    Time  8    Period  Weeks    Status  New    Target Date  09/10/19      PT LONG TERM GOAL #2   Title  Patient will be able to walk 1 mile without AD and only a little bit of difficulty to improve community access and shopping ability    Time  8    Period  Weeks    Status  New    Target Date  09/10/19      PT LONG TERM GOAL #3   Title  Patient will exhibit left knee strength 5/5 MMT to perform heavy household tasks and negotiate stairs with little to no difficulty    Time  8    Period  Weeks    Status  New    Target Date  09/10/19  PT LONG TERM GOAL #4   Title  Patient will exhibit improved knee AROM to >/= 120 deg flexion and 0 deg extension to normalize transfers and functional movement    Time  8    Period  Weeks    Status  New    Target Date  09/10/19            Plan - 08/06/19 1105    Clinical Impression Statement  Patient limited in time due to patient arriving late. She tolerated therapy well with no adverse effects. Used the  bike this visit to progress knee stretching and she was able to perform full revolutions. Step-ups were progressed to 8" step height and she did require single UE support for balance. Vaso at end of therapy to reduce soreness with good benefit. She would benefit from continued skilled PT to progress motion and strength to maximize functional mobility.    PT Treatment/Interventions  ADLs/Self Care Home Management;Cryotherapy;Electrical Stimulation;Moist Heat;Neuromuscular re-education;Balance training;Therapeutic exercise;Therapeutic activities;Functional mobility training;Stair training;Gait training;Patient/family education;Manual techniques;Dry needling;Passive range of motion;Taping;Joint Manipulations;Vasopneumatic Device    PT Next Visit Plan  Assess HEP and progress PRN, bike, manual and stretching for knee flexion motion, progress LLE strengthening with step-ups, vaso for edema    PT Home Exercise Plan  YNBNQEDX: longsitting calf stretch, seated hamstring stretch, supine heel slide with strap, seated knee flexion stretch with other leg assist and with scoot, quad set, SLR, LAQ, standing hip abduction, extension, marching    Consulted and Agree with Plan of Care  Patient       Patient will benefit from skilled therapeutic intervention in order to improve the following deficits and impairments:  Abnormal gait, Decreased range of motion, Difficulty walking, Decreased activity tolerance, Pain, Decreased balance, Improper body mechanics, Decreased strength, Increased edema, Postural dysfunction  Visit Diagnosis: Stiffness of left knee, not elsewhere classified  Muscle weakness (generalized)  Localized edema  Other abnormalities of gait and mobility  Chronic pain of left knee     Problem List Patient Active Problem List   Diagnosis Date Noted  . Arthritis of left knee 07/01/2019  . B12 deficiency 03/23/2019  . Anemia 02/05/2019  . Rheumatoid arthritis with rheumatoid factor of multiple  sites without organ or systems involvement ([place]) 01/25/2019  . High risk medication use 01/25/2019  . Fatigue 09/18/2018  . Routine general medical examination at a health care facility 02/07/2015  . Morbid obesity ([place]) 04/19/2013  . Hyperlipidemia associated with type 2 diabetes mellitus ([place]) 07/28/2009  . Osteopenia 03/14/2009  . Diabetes mellitus type 2 with complications ([place]) 0000000  . Hypothyroidism 09/24/2006  . Essential hypertension 09/24/2006    Hilda Blades, PT, DPT, LAT, ATC 08/06/19  11:34 AM Phone: [phone] Fax: [facility] [address] [place], [place], [address] Phone: [phone]   Fax:  [phone]  Name: Marissa Roberts MRN: [id number] Date of Birth: 02-Jun-1945

## 2019-08-09 ENCOUNTER — Encounter: Payer: Self-pay | Admitting: Rheumatology

## 2019-08-09 ENCOUNTER — Other Ambulatory Visit: Payer: Self-pay

## 2019-08-09 ENCOUNTER — Ambulatory Visit (INDEPENDENT_AMBULATORY_CARE_PROVIDER_SITE_OTHER): Payer: Medicare Other | Admitting: Rheumatology

## 2019-08-09 VITALS — BP 116/67 | HR 75 | Resp 18 | Ht 65.0 in | Wt 228.4 lb

## 2019-08-09 DIAGNOSIS — I10 Essential (primary) hypertension: Secondary | ICD-10-CM

## 2019-08-09 DIAGNOSIS — M0579 Rheumatoid arthritis with rheumatoid factor of multiple sites without organ or systems involvement: Secondary | ICD-10-CM

## 2019-08-09 DIAGNOSIS — M8580 Other specified disorders of bone density and structure, unspecified site: Secondary | ICD-10-CM

## 2019-08-09 DIAGNOSIS — M19071 Primary osteoarthritis, right ankle and foot: Secondary | ICD-10-CM | POA: Diagnosis not present

## 2019-08-09 DIAGNOSIS — E785 Hyperlipidemia, unspecified: Secondary | ICD-10-CM

## 2019-08-09 DIAGNOSIS — Z79899 Other long term (current) drug therapy: Secondary | ICD-10-CM

## 2019-08-09 DIAGNOSIS — Z8639 Personal history of other endocrine, nutritional and metabolic disease: Secondary | ICD-10-CM

## 2019-08-09 DIAGNOSIS — E118 Type 2 diabetes mellitus with unspecified complications: Secondary | ICD-10-CM

## 2019-08-09 DIAGNOSIS — M19072 Primary osteoarthritis, left ankle and foot: Secondary | ICD-10-CM

## 2019-08-09 DIAGNOSIS — Z96652 Presence of left artificial knee joint: Secondary | ICD-10-CM

## 2019-08-09 DIAGNOSIS — E1169 Type 2 diabetes mellitus with other specified complication: Secondary | ICD-10-CM

## 2019-08-09 LAB — CBC WITH DIFFERENTIAL/PLATELET
Absolute Monocytes: 949 cells/uL (ref 200–950)
Basophils Absolute: 82 cells/uL (ref 0–200)
Basophils Relative: 0.8 %
Eosinophils Absolute: 214 cells/uL (ref 15–500)
Eosinophils Relative: 2.1 %
HCT: 31.9 % — ABNORMAL LOW (ref 35.0–45.0)
Hemoglobin: 10 g/dL — ABNORMAL LOW (ref 11.7–15.5)
Lymphs Abs: 3346 cells/uL (ref 850–3900)
MCH: 26 pg — ABNORMAL LOW (ref 27.0–33.0)
MCHC: 31.3 g/dL — ABNORMAL LOW (ref 32.0–36.0)
MCV: 82.9 fL (ref 80.0–100.0)
MPV: 10.6 fL (ref 7.5–12.5)
Monocytes Relative: 9.3 %
Neutro Abs: 5610 cells/uL (ref 1500–7800)
Neutrophils Relative %: 55 %
Platelets: 342 10*3/uL (ref 140–400)
RBC: 3.85 10*6/uL (ref 3.80–5.10)
RDW: 17 % — ABNORMAL HIGH (ref 11.0–15.0)
Total Lymphocyte: 32.8 %
WBC: 10.2 10*3/uL (ref 3.8–10.8)

## 2019-08-09 LAB — COMPLETE METABOLIC PANEL WITH GFR
AG Ratio: 1.8 (calc) (ref 1.0–2.5)
ALT: 16 U/L (ref 6–29)
AST: 20 U/L (ref 10–35)
Albumin: 4.1 g/dL (ref 3.6–5.1)
Alkaline phosphatase (APISO): 61 U/L (ref 37–153)
BUN: 12 mg/dL (ref 7–25)
CO2: 31 mmol/L (ref 20–32)
Calcium: 10.1 mg/dL (ref 8.6–10.4)
Chloride: 101 mmol/L (ref 98–110)
Creat: 0.83 mg/dL (ref 0.60–0.93)
GFR, Est African American: 82 mL/min/{1.73_m2} (ref >=60)
GFR, Est Non African American: 71 mL/min/{1.73_m2} (ref >=60)
Globulin: 2.3 g/dL (calc) (ref 1.9–3.7)
Glucose, Bld: 75 mg/dL (ref 65–99)
Potassium: 4.5 mmol/L (ref 3.5–5.3)
Sodium: 141 mmol/L (ref 135–146)
Total Bilirubin: 0.3 mg/dL (ref 0.2–1.2)
Total Protein: 6.4 g/dL (ref 6.1–8.1)

## 2019-08-09 NOTE — Patient Instructions (Signed)
Standing Labs We placed an order today for your standing lab work.    Please come back and get your standing labs in August and every 3 months   We have open lab daily Monday through Thursday from 8:30-12:30 PM and 1:30-4:30 PM and Friday from 8:30-12:30 PM and 1:30-4:00 PM at the office of Dr. Shaili Deveshwar.   You may experience shorter wait times on Monday and Friday afternoons. The office is located at [address], [address], [place], [place] [address] No appointment is necessary.   Labs are drawn by [facility].  You may receive a bill from [facility] for your lab work.  If you wish to have your labs drawn at another location, please call the office 24 hours in advance to send orders.  If you have any questions regarding directions or hours of operation,  please call [phone].   Just as a reminder please drink plenty of water prior to coming for your lab work. Thanks!   

## 2019-08-10 ENCOUNTER — Ambulatory Visit: Payer: Medicare Other | Admitting: Physical Therapy

## 2019-08-10 NOTE — Progress Notes (Signed)
Hgb and hct are low but stable. WBC count is WNL.  Please notify the patient and forward labs to PCP.  CMP WNL.

## 2019-08-12 ENCOUNTER — Other Ambulatory Visit: Payer: Self-pay

## 2019-08-12 ENCOUNTER — Ambulatory Visit (INDEPENDENT_AMBULATORY_CARE_PROVIDER_SITE_OTHER): Payer: Medicare Other | Admitting: Orthopedic Surgery

## 2019-08-12 DIAGNOSIS — Z96652 Presence of left artificial knee joint: Secondary | ICD-10-CM

## 2019-08-12 MED ORDER — HYDROCODONE-ACETAMINOPHEN 5-325 MG PO TABS: 1.0000 | ORAL_TABLET | Freq: Two times a day (BID) | ORAL | 0 refills | Status: DC | PRN

## 2019-08-12 MED ORDER — GABAPENTIN 300 MG PO CAPS: 300.0000 mg | ORAL_CAPSULE | Freq: Three times a day (TID) | ORAL | 0 refills | Status: DC

## 2019-08-13 ENCOUNTER — Encounter: Payer: Self-pay | Admitting: Orthopedic Surgery

## 2019-08-13 ENCOUNTER — Encounter: Payer: Self-pay | Admitting: Physical Therapy

## 2019-08-13 ENCOUNTER — Ambulatory Visit: Payer: Medicare Other | Admitting: Physical Therapy

## 2019-08-13 DIAGNOSIS — R2689 Other abnormalities of gait and mobility: Secondary | ICD-10-CM | POA: Diagnosis not present

## 2019-08-13 DIAGNOSIS — G8929 Other chronic pain: Secondary | ICD-10-CM | POA: Diagnosis not present

## 2019-08-13 DIAGNOSIS — M6281 Muscle weakness (generalized): Secondary | ICD-10-CM

## 2019-08-13 DIAGNOSIS — M25562 Pain in left knee: Secondary | ICD-10-CM | POA: Diagnosis not present

## 2019-08-13 DIAGNOSIS — M25662 Stiffness of left knee, not elsewhere classified: Secondary | ICD-10-CM | POA: Diagnosis not present

## 2019-08-13 DIAGNOSIS — R600 Localized edema: Secondary | ICD-10-CM | POA: Diagnosis not present

## 2019-08-13 NOTE — Therapy (Signed)
[place], [place], [address] Phone: [phone]   Fax:  [phone]  Physical Therapy Treatment  Patient Details  Name: Marissa Roberts MRN: [id number] Date of Birth: 1945/12/17 Referring Provider (PT): Donella Stade, [place]   Encounter Date: 08/13/2019  PT End of Session - 08/13/19 1150    Visit Number  8    Number of Visits  16    Date for PT Re-Evaluation  09/10/19    Authorization Type  BCBS MCR    PT Start Time  [id number]    PT Stop Time  1228    PT Time Calculation (min)  43 min       Past Medical History:  Diagnosis Date  . Arthritis   . Bronchitis    hx of  . Diabetes mellitus   . Family history of adverse reaction to anesthesia    per patient, "daughter threw up after a knee surgery"  . GERD (gastroesophageal reflux disease)   . Hypercholesteremia   . Hypertension   . Hypothyroidism   . Pneumonia    hx of  . PONV (postoperative nausea and vomiting)   . Thyroid disease     Past Surgical History:  Procedure Laterality Date  . ABDOMINAL HYSTERECTOMY     partial  . BREAST SURGERY     lumpectomy left breast  . carpel tunnel    . COLONOSCOPY W/ POLYPECTOMY    . FOOT SURGERY    . knee arhtroscopy    . KNEE ARTHROPLASTY    . SHOULDER ARTHROSCOPY WITH SUBACROMIAL DECOMPRESSION, ROTATOR CUFF REPAIR AND BICEP TENDON REPAIR Right 05/26/2012   Procedure: Right Shoulder Diagnostic Operative Arthroscopy, Subacromial Decompression, Biceps Tenodesis, Mini Open Rotator Cuff Repair;  Surgeon: [name], MD;  Location: [facility];  Service: Orthopedics;  Laterality: Right;  Right Shoulder Diagnostic Operative Arthroscopy, Subacromial Decompression, Biceps Tenodesis, Mini Open Rotator Cuff Repair  . TONSILLECTOMY    . TOTAL KNEE ARTHROPLASTY Left 07/01/2019   Procedure: LEFT TOTAL KNEE ARTHROPLASTY;  Surgeon: [name], MD;  Location: [facility];  Service: Orthopedics;  Laterality: Left;     There were no vitals filed for this visit.  Subjective Assessment - 08/13/19 1148    Subjective  Increased swelling and pain maybe from cooking over the weekend. Now wearing TED hose. I havent been doing my exercises.    Currently in Pain?  Yes    Pain Score  6     Pain Location  Knee    Pain Orientation  Left    Pain Descriptors / Indicators  Aching;Sore    Pain Type  Surgical pain    Aggravating Factors   activity in feet    Pain Relieving Factors  ice, meds, rest, elevation         [facility] PT Assessment - 08/13/19 0001      AROM   Left Knee Flexion  105   108 at end of session                   [place] Adult PT Treatment/Exercise - 08/13/19 0001      Knee/Hip Exercises: Stretches   Hip Flexor Stretch  3 reps;20 seconds    Hip Flexor Stretch Limitations  supine edge of mat      Knee/Hip Exercises: Aerobic   Recumbent Bike  x5 min working on knee motion (fwd and bwd)      Knee/Hip Exercises: Machines for Strengthening   Cybex  Knee Extension  5# bilat and single , then bilat for concentric, LEft eccentric     Cybex Knee Flexion  20# bilateral      Knee/Hip Exercises: Standing   Forward Step Up  2 sets;10 reps    Forward Step Up Limitations  8" step height, single UE support      Knee/Hip Exercises: Seated   Sit to Sand  10 reps      Knee/Hip Exercises: Supine   Quad Sets  10 reps   5 sec hold   Straight Leg Raises  15 reps    Straight Leg Raises Limitations  Left       Vasopneumatic   Number Minutes Vasopneumatic   10 minutes    Vasopnuematic Location   Knee    Vasopneumatic Pressure  Medium    Vasopneumatic Temperature   34      Manual Therapy   Joint Mobilization  Left tibiofemoral and patellofemoral joint mobs    Soft tissue mobilization  Roller to left quad and hip flexor region               PT Short Term Goals - 08/02/19 1125      PT SHORT TERM GOAL #1   Title  Patient will be I with initial HEP to progress in PT    Time  4     Period  Weeks    Status  On-going    Target Date  08/13/19      PT SHORT TERM GOAL #2   Title  Patient will exhibit >/= 105 deg knee flexion to improve transfers    Time  4    Period  Weeks    Status  On-going    Target Date  08/13/19      PT SHORT TERM GOAL #3   Title  Patient will be able to walk household distances without AD or gait deviation to improve ability to perform light household tasks    Time  4    Period  Weeks    Status  Achieved      PT SHORT TERM GOAL #4   Title  Patient will report knee pain with activity </= 3/10 to reduce compensation and improve functional ability    Time  4    Period  Weeks    Status  On-going    Target Date  08/13/19        PT Long Term Goals - 07/16/19 1238      PT LONG TERM GOAL #1   Title  Patient will be I with final HEP to maintain progress from PT    Time  8    Period  Weeks    Status  New    Target Date  09/10/19      PT LONG TERM GOAL #2   Title  Patient will be able to walk 1 mile without AD and only a little bit of difficulty to improve community access and shopping ability    Time  8    Period  Weeks    Status  New    Target Date  09/10/19      PT LONG TERM GOAL #3   Title  Patient will exhibit left knee strength 5/5 MMT to perform heavy household tasks and negotiate stairs with little to no difficulty    Time  8    Period  Weeks    Status  New    Target Date  09/10/19  PT LONG TERM GOAL #4   Title  Patient will exhibit improved knee AROM to >/= 120 deg flexion and 0 deg extension to normalize transfers and functional movement    Time  8    Period  Weeks    Status  New    Target Date  09/10/19            Plan - 08/13/19 1226    Clinical Impression Statement  Pt reports having increased swelling which improved with TED hose. Improved ease of ROM on recumbent bike today. Began gym machnies for knee flexion and extension strengthening. ROM improved and measured 108 after therex. Discussed current  POC and end date with patient. Vaso at end of session to reduce edema.    PT Next Visit Plan  Assess HEP and progress PRN, bike, manual and stretching for knee flexion motion, progress LLE strengthening with step-ups, vaso for edema    PT Home Exercise Plan  YNBNQEDX: longsitting calf stretch, seated hamstring stretch, supine heel slide with strap, seated knee flexion stretch with other leg assist and with scoot, quad set, SLR, LAQ, standing hip abduction, extension, marching       Patient will benefit from skilled therapeutic intervention in order to improve the following deficits and impairments:  Abnormal gait, Decreased range of motion, Difficulty walking, Decreased activity tolerance, Pain, Decreased balance, Improper body mechanics, Decreased strength, Increased edema, Postural dysfunction  Visit Diagnosis: Muscle weakness (generalized)  Localized edema  Chronic pain of left knee  Stiffness of left knee, not elsewhere classified  Other abnormalities of gait and mobility     Problem List Patient Active Problem List   Diagnosis Date Noted  . Arthritis of left knee 07/01/2019  . B12 deficiency 03/23/2019  . Anemia 02/05/2019  . Rheumatoid arthritis with rheumatoid factor of multiple sites without organ or systems involvement ([place]) 01/25/2019  . High risk medication use 01/25/2019  . Fatigue 09/18/2018  . Routine general medical examination at a health care facility 02/07/2015  . Morbid obesity ([place]) 04/19/2013  . Hyperlipidemia associated with type 2 diabetes mellitus ([place]) 07/28/2009  . Osteopenia 03/14/2009  . Diabetes mellitus type 2 with complications ([place]) 0000000  . Hypothyroidism 09/24/2006  . Essential hypertension 09/24/2006    Dorene Ar, PTA 08/13/2019, 12:37 PM  [facility] [address] [place], [place], [address] Phone: [phone]   Fax:  [phone]  Name: Marissa Roberts MRN:  [id number] Date of Birth: 27-Apr-1945

## 2019-08-13 NOTE — Progress Notes (Signed)
Post-Op Visit Note   Patient: Marissa Roberts           Date of Birth: [birth year]           MRN: [id number] Visit Date: 08/12/2019 PCP: [name], MD   Assessment & Plan:  Chief Complaint:  Chief Complaint  Patient presents with  . Left Knee - Routine Post Op   Visit Diagnoses:  1. Status post total left knee replacement     Plan: Patient is a 74 year old female presents s/p left total knee arthroplasty on 07/01/2019.  Patient is doing well and continues to make improvements.  Her pain continues to improve though she notes some likely nerve related pain along the lateral aspect of her incision.  She is compliant with taking aspirin once a day.  She may stop this now that she is walking well.  She is walking with no assistance device.  She is taking Robaxin and Tylenol as needed.  Plan to taper patient off of oxycodone to hydrocodone and from there we will likely taper to tramadol at the next appointment.  Refilled prescription for gabapentin.  Patient is going to physical therapy on [address] where they are working on strengthening, flexion and using the exercise bike.  Continue physical therapy 1-2 times per week for 6 weeks.  On exam patient's incision is healing well.  She has no calf tenderness and a negative Homans' sign.  She has 0 degrees of extension and 105 degrees of flexion.  She is doing excellent for 6 weeks out from surgery.  Follow-up in 6 weeks for clinical recheck.  Follow-Up Instructions: No follow-ups on file.   Orders:  No orders of the defined types were placed in this encounter.  Meds ordered this encounter  Medications  . HYDROcodone-acetaminophen (NORCO/VICODIN) 5-325 MG tablet    Sig: Take 1 tablet by mouth every 12 (twelve) hours as needed for moderate pain.    Dispense:  30 tablet    Refill:  0  . gabapentin (NEURONTIN) 300 MG capsule    Sig: Take 1 capsule (300 mg total) by mouth 3 (three) times daily.    Dispense:  90 capsule   Refill:  0    Imaging: No results found.  PMFS History: Patient Active Problem List   Diagnosis Date Noted  . Arthritis of left knee 07/01/2019  . B12 deficiency 03/23/2019  . Anemia 02/05/2019  . Rheumatoid arthritis with rheumatoid factor of multiple sites without organ or systems involvement ([place]) 01/25/2019  . High risk medication use 01/25/2019  . Fatigue 09/18/2018  . Routine general medical examination at a health care facility 02/07/2015  . Morbid obesity ([place]) 04/19/2013  . Hyperlipidemia associated with type 2 diabetes mellitus ([place]) 07/28/2009  . Osteopenia 03/14/2009  . Diabetes mellitus type 2 with complications ([place]) 0000000  . Hypothyroidism 09/24/2006  . Essential hypertension 09/24/2006   Past Medical History:  Diagnosis Date  . Arthritis   . Bronchitis    hx of  . Diabetes mellitus   . Family history of adverse reaction to anesthesia    per patient, "daughter threw up after a knee surgery"  . GERD (gastroesophageal reflux disease)   . Hypercholesteremia   . Hypertension   . Hypothyroidism   . Pneumonia    hx of  . PONV (postoperative nausea and vomiting)   . Thyroid disease     Family History  Problem Relation Age of Onset  . Diabetes Mother   . Heart disease  Mother   . Hyperlipidemia Mother   . Diabetes Father   . Diabetes Sister   . Hypertension Sister   . Heart disease Sister   . Dementia Sister   . Heart disease Brother   . Healthy Daughter   . Healthy Daughter   . Healthy Daughter   . Healthy Son     Past Surgical History:  Procedure Laterality Date  . ABDOMINAL HYSTERECTOMY     partial  . BREAST SURGERY     lumpectomy left breast  . carpel tunnel    . COLONOSCOPY W/ POLYPECTOMY    . FOOT SURGERY    . knee arhtroscopy    . KNEE ARTHROPLASTY    . SHOULDER ARTHROSCOPY WITH SUBACROMIAL DECOMPRESSION, ROTATOR CUFF REPAIR AND BICEP TENDON REPAIR Right 05/26/2012   Procedure: Right Shoulder Diagnostic Operative Arthroscopy,  Subacromial Decompression, Biceps Tenodesis, Mini Open Rotator Cuff Repair;  Surgeon: [name], MD;  Location: [facility];  Service: Orthopedics;  Laterality: Right;  Right Shoulder Diagnostic Operative Arthroscopy, Subacromial Decompression, Biceps Tenodesis, Mini Open Rotator Cuff Repair  . TONSILLECTOMY    . TOTAL KNEE ARTHROPLASTY Left 07/01/2019   Procedure: LEFT TOTAL KNEE ARTHROPLASTY;  Surgeon: [name], MD;  Location: [facility];  Service: Orthopedics;  Laterality: Left;   Social History   Occupational History  . Not on file  Tobacco Use  . Smoking status: Former Smoker    Quit date: 04/01/1990    Years since quitting: 29.3  . Smokeless tobacco: Never Used  Substance and Sexual Activity  . Alcohol use: Not Currently    Comment: 06/22/2019: per patient about 20-25 years ago  . Drug use: No  . Sexual activity: Never    Birth control/protection: Abstinence

## 2019-08-17 ENCOUNTER — Other Ambulatory Visit: Payer: Self-pay

## 2019-08-17 ENCOUNTER — Other Ambulatory Visit: Payer: Self-pay | Admitting: Endocrinology

## 2019-08-18 ENCOUNTER — Other Ambulatory Visit: Payer: Self-pay

## 2019-08-18 ENCOUNTER — Ambulatory Visit: Payer: Medicare Other

## 2019-08-18 DIAGNOSIS — M25562 Pain in left knee: Secondary | ICD-10-CM

## 2019-08-18 DIAGNOSIS — M6281 Muscle weakness (generalized): Secondary | ICD-10-CM

## 2019-08-18 DIAGNOSIS — M25662 Stiffness of left knee, not elsewhere classified: Secondary | ICD-10-CM

## 2019-08-18 DIAGNOSIS — G8929 Other chronic pain: Secondary | ICD-10-CM | POA: Diagnosis not present

## 2019-08-18 DIAGNOSIS — R2689 Other abnormalities of gait and mobility: Secondary | ICD-10-CM | POA: Diagnosis not present

## 2019-08-18 DIAGNOSIS — R600 Localized edema: Secondary | ICD-10-CM

## 2019-08-18 NOTE — Therapy (Signed)
[place], [place], [address] Phone: [phone]   Fax:  [phone]  Physical Therapy Treatment  Patient Details  Name: Marissa Roberts MRN: [id number] Date of Birth: 06-06-1945 Referring Provider (PT): Donella Stade, [place]   Encounter Date: 08/18/2019  PT End of Session - 08/18/19 2142    Visit Number  9    Date for PT Re-Evaluation  09/10/19    Authorization Type  [employer] MCR    PT Start Time  0915    PT Stop Time  1003    PT Time Calculation (min)  48 min    Activity Tolerance  Patient tolerated treatment well    Behavior During Therapy  [facility] for tasks assessed/performed       Past Medical History:  Diagnosis Date  . Arthritis   . Bronchitis    hx of  . Diabetes mellitus   . Family history of adverse reaction to anesthesia    per patient, "daughter threw up after a knee surgery"  . GERD (gastroesophageal reflux disease)   . Hypercholesteremia   . Hypertension   . Hypothyroidism   . Pneumonia    hx of  . PONV (postoperative nausea and vomiting)   . Thyroid disease     Past Surgical History:  Procedure Laterality Date  . ABDOMINAL HYSTERECTOMY     partial  . BREAST SURGERY     lumpectomy left breast  . carpel tunnel    . COLONOSCOPY W/ POLYPECTOMY    . FOOT SURGERY    . knee arhtroscopy    . KNEE ARTHROPLASTY    . SHOULDER ARTHROSCOPY WITH SUBACROMIAL DECOMPRESSION, ROTATOR CUFF REPAIR AND BICEP TENDON REPAIR Right 05/26/2012   Procedure: Right Shoulder Diagnostic Operative Arthroscopy, Subacromial Decompression, Biceps Tenodesis, Mini Open Rotator Cuff Repair;  Surgeon: [name], MD;  Location: [facility];  Service: Orthopedics;  Laterality: Right;  Right Shoulder Diagnostic Operative Arthroscopy, Subacromial Decompression, Biceps Tenodesis, Mini Open Rotator Cuff Repair  . TONSILLECTOMY    . TOTAL KNEE ARTHROPLASTY Left 07/01/2019   Procedure: LEFT TOTAL KNEE ARTHROPLASTY;   Surgeon: [name], MD;  Location: [facility];  Service: Orthopedics;  Laterality: Left;    There were no vitals filed for this visit.  Subjective Assessment - 08/18/19 0932    Subjective  Pt reports she is doing well, but pain is an issue. Pt states she was concerned about taking hydrocodone and has not taken it since she was switched off oxycodone to the hydrocodone. Pt voiced understanding she can take the hydrocodone as prescribed/or less as needed for pain management.                        [place] Adult PT Treatment/Exercise - 08/18/19 0001      Exercises   Exercises  Knee/Hip      Knee/Hip Exercises: Stretches   Knee: Self-Stretch to increase Flexion  Left;5 reps;20 seconds    Knee: Self-Stretch Limitations  Seated heel slide then scoot forward for flexion stretch 5x20 sec      Knee/Hip Exercises: Aerobic   Recumbent Bike  5 min for knee flexion; forward/backward      Knee/Hip Exercises: Machines for Strengthening   Cybex Knee Extension  5# bilat and single , then bilat for concentric, LEft eccentric; 10x2    Cybex Knee Flexion  20# bilateral; 10x2      Knee/Hip Exercises: Sidelying   Hip ABduction  Strengthening;1  set;10 reps;Left    Clams  L; 10x2      Modalities   Modalities  Cryotherapy      Cryotherapy   Number Minutes Cryotherapy  10 Minutes    Cryotherapy Location  Knee    Type of Cryotherapy  Ice pack             PT Education - 08/18/19 2137    Education Details  Pt was concerned about taking hydrocodone whcih she was prescribed when oxycodone was DCed. Pt was advised hydrocodone was not as powerful of a pain med as oxycodone, and she could take it as prescribed or less as needed to assist in her L knee pain management.    Person(s) Educated  Patient    Methods  Explanation    Comprehension  Verbalized understanding       PT Short Term Goals - 08/02/19 1125      PT SHORT TERM GOAL #1   Title  Patient will be I with initial HEP  to progress in PT    Time  4    Period  Weeks    Status  On-going    Target Date  08/13/19      PT SHORT TERM GOAL #2   Title  Patient will exhibit >/= 105 deg knee flexion to improve transfers    Time  4    Period  Weeks    Status  On-going    Target Date  08/13/19      PT SHORT TERM GOAL #3   Title  Patient will be able to walk household distances without AD or gait deviation to improve ability to perform light household tasks    Time  4    Period  Weeks    Status  Achieved      PT SHORT TERM GOAL #4   Title  Patient will report knee pain with activity </= 3/10 to reduce compensation and improve functional ability    Time  4    Period  Weeks    Status  On-going    Target Date  08/13/19        PT Long Term Goals - 07/16/19 1238      PT LONG TERM GOAL #1   Title  Patient will be I with final HEP to maintain progress from PT    Time  8    Period  Weeks    Status  New    Target Date  09/10/19      PT LONG TERM GOAL #2   Title  Patient will be able to walk 1 mile without AD and only a little bit of difficulty to improve community access and shopping ability    Time  8    Period  Weeks    Status  New    Target Date  09/10/19      PT LONG TERM GOAL #3   Title  Patient will exhibit left knee strength 5/5 MMT to perform heavy household tasks and negotiate stairs with little to no difficulty    Time  8    Period  Weeks    Status  New    Target Date  09/10/19      PT LONG TERM GOAL #4   Title  Patient will exhibit improved knee AROM to >/= 120 deg flexion and 0 deg extension to normalize transfers and functional movement    Time  8    Period  Weeks  Status  New    Target Date  09/10/19            Plan - 08/18/19 2143    Clinical Impression Statement  Pt reported more pain today and per her report she has not been taking the hydrocodone she was prescribed after the oxycodone was DC. L knee flexion was initially 101d and improved to 106 at the end of the PT  session.    PT Treatment/Interventions  ADLs/Self Care Home Management;Cryotherapy;Electrical Stimulation;Moist Heat;Neuromuscular re-education;Balance training;Therapeutic exercise;Therapeutic activities;Functional mobility training;Stair training;Gait training;Patient/family education;Manual techniques;Dry needling;Passive range of motion;Taping;Joint Manipulations;Vasopneumatic Device    PT Next Visit Plan  Assess pt's pain level and use of meds for management    PT Home Exercise Plan  [facility]       Patient will benefit from skilled therapeutic intervention in order to improve the following deficits and impairments:  Abnormal gait, Decreased range of motion, Difficulty walking, Decreased activity tolerance, Pain, Decreased balance, Improper body mechanics, Decreased strength, Increased edema, Postural dysfunction  Visit Diagnosis: Muscle weakness (generalized)  Localized edema  Chronic pain of left knee  Stiffness of left knee, not elsewhere classified  Other abnormalities of gait and mobility     Problem List Patient Active Problem List   Diagnosis Date Noted  . Arthritis of left knee 07/01/2019  . B12 deficiency 03/23/2019  . Anemia 02/05/2019  . 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 High risk medication use 01/25/2019  . Fatigue 09/18/2018  . Routine general medical examination at a health care facility 02/07/2015  . Morbid obesity ([place]) 04/19/2013  . Hyperlipidemia associated with type 2 diabetes mellitus ([place]) 07/28/2009  . Osteopenia 03/14/2009  . Diabetes mellitus type 2 with complications ([place]) 0000000  . Hypothyroidism 09/24/2006  . Essential hypertension 09/24/2006    Gar Ponto MS, PT 08/18/19 9:55 PM  [facility] [address] [place], [place], [address] Phone: [phone]   Fax:  [phone]  Name: Marissa Roberts MRN: [id number] Date of Birth: November 03, 1945

## 2019-08-23 ENCOUNTER — Ambulatory Visit: Payer: Medicare Other | Admitting: Physical Therapy

## 2019-08-23 ENCOUNTER — Encounter: Payer: Self-pay | Admitting: Physical Therapy

## 2019-08-23 ENCOUNTER — Other Ambulatory Visit: Payer: Self-pay

## 2019-08-23 DIAGNOSIS — M6281 Muscle weakness (generalized): Secondary | ICD-10-CM

## 2019-08-23 DIAGNOSIS — M25662 Stiffness of left knee, not elsewhere classified: Secondary | ICD-10-CM | POA: Diagnosis not present

## 2019-08-23 DIAGNOSIS — R2689 Other abnormalities of gait and mobility: Secondary | ICD-10-CM

## 2019-08-23 DIAGNOSIS — G8929 Other chronic pain: Secondary | ICD-10-CM | POA: Diagnosis not present

## 2019-08-23 DIAGNOSIS — R600 Localized edema: Secondary | ICD-10-CM | POA: Diagnosis not present

## 2019-08-23 DIAGNOSIS — M25562 Pain in left knee: Secondary | ICD-10-CM | POA: Diagnosis not present

## 2019-08-23 NOTE — Therapy (Signed)
[place], [place], [address] Phone: [phone]   Fax:  [phone]  Physical Therapy Treatment  Patient Details  Name: Marissa Roberts MRN: [id number] Date of Birth: [birth year] Referring Provider (PT): Donella Stade, [place]   Encounter Date: 08/23/2019  PT End of Session - 08/23/19 1231    Visit Number  10    Number of Visits  16    Date for PT Re-Evaluation  09/10/19    Authorization Type  [employer] MCR    PT Start Time  1230    PT Stop Time  1308    PT Time Calculation (min)  38 min       Past Medical History:  Diagnosis Date  . Arthritis   . Bronchitis    hx of  . Diabetes mellitus   . Family history of adverse reaction to anesthesia    per patient, "daughter threw up after a knee surgery"  . GERD (gastroesophageal reflux disease)   . Hypercholesteremia   . Hypertension   . Hypothyroidism   . Pneumonia    hx of  . PONV (postoperative nausea and vomiting)   . Thyroid disease     Past Surgical History:  Procedure Laterality Date  . ABDOMINAL HYSTERECTOMY     partial  . BREAST SURGERY     lumpectomy left breast  . carpel tunnel    . COLONOSCOPY W/ POLYPECTOMY    . FOOT SURGERY    . knee arhtroscopy    . KNEE ARTHROPLASTY    . SHOULDER ARTHROSCOPY WITH SUBACROMIAL DECOMPRESSION, ROTATOR CUFF REPAIR AND BICEP TENDON REPAIR Right 05/26/2012   Procedure: Right Shoulder Diagnostic Operative Arthroscopy, Subacromial Decompression, Biceps Tenodesis, Mini Open Rotator Cuff Repair;  Surgeon: [name], MD;  Location: [facility];  Service: Orthopedics;  Laterality: Right;  Right Shoulder Diagnostic Operative Arthroscopy, Subacromial Decompression, Biceps Tenodesis, Mini Open Rotator Cuff Repair  . TONSILLECTOMY    . TOTAL KNEE ARTHROPLASTY Left 07/01/2019   Procedure: LEFT TOTAL KNEE ARTHROPLASTY;  Surgeon: [name], MD;  Location: [facility];  Service: Orthopedics;  Laterality: Left;     There were no vitals filed for this visit.  Subjective Assessment - 08/23/19 1231    Subjective  Pain at night mostly and after my exercises. No pain now.    Pain Score  0-No pain    Pain Orientation  Left         [facility] PT Assessment - 08/23/19 0001      AROM   Left Knee Flexion  --   113 at end of session                   [place] Adult PT Treatment/Exercise - 08/23/19 0001      Knee/Hip Exercises: Stretches   Active Hamstring Stretch  2 reps    Active Hamstring Stretch Limitations  long sitting     Hip Flexor Stretch  3 reps;20 seconds    Hip Flexor Stretch Limitations  supine edge of mat    Knee: Self-Stretch Limitations  Seated scoot stretch for knee flexion     Gastroc Stretch  2 reps;20 seconds    Gastroc Stretch Limitations  slant board      Knee/Hip Exercises: Aerobic   Recumbent Bike  L1 5 min for knee flexion;     Nustep  L5 x 5 min (LE only)      Knee/Hip Exercises: Machines for Strengthening   Cybex Knee  Extension  5# bilat and single , then bilat for concentric, LEft eccentric; 10x2    Cybex Knee Flexion  20# bilateral; 10x2   eccentric Left focus     Knee/Hip Exercises: Standing   Lateral Step Up  2 sets;10 reps;Step Height: 6"    Forward Step Up  2 sets;10 reps;Step Height: 6"    Step Down  2 sets;10 reps;Step Height: 4"    SLS  2 sec     Other Standing Knee Exercises  3 way hip with SLS on left - needs light UE touch       Knee/Hip Exercises: Seated   Sit to Sand  10 reps      Vasopneumatic   Number Minutes Vasopneumatic   10 minutes    Vasopnuematic Location   Knee    Vasopneumatic Pressure  Medium    Vasopneumatic Temperature   34               PT Short Term Goals - 08/02/19 1125      PT SHORT TERM GOAL #1   Title  Patient will be I with initial HEP to progress in PT    Time  4    Period  Weeks    Status  On-going    Target Date  08/13/19      PT SHORT TERM GOAL #2   Title  Patient will exhibit >/= 105 deg  knee flexion to improve transfers    Time  4    Period  Weeks    Status  On-going    Target Date  08/13/19      PT SHORT TERM GOAL #3   Title  Patient will be able to walk household distances without AD or gait deviation to improve ability to perform light household tasks    Time  4    Period  Weeks    Status  Achieved      PT SHORT TERM GOAL #4   Title  Patient will report knee pain with activity </= 3/10 to reduce compensation and improve functional ability    Time  4    Period  Weeks    Status  On-going    Target Date  08/13/19        PT Long Term Goals - 07/16/19 1238      PT LONG TERM GOAL #1   Title  Patient will be I with final HEP to maintain progress from PT    Time  8    Period  Weeks    Status  New    Target Date  09/10/19      PT LONG TERM GOAL #2   Title  Patient will be able to walk 1 mile without AD and only a little bit of difficulty to improve community access and shopping ability    Time  8    Period  Weeks    Status  New    Target Date  09/10/19      PT LONG TERM GOAL #3   Title  Patient will exhibit left knee strength 5/5 MMT to perform heavy household tasks and negotiate stairs with little to no difficulty    Time  8    Period  Weeks    Status  New    Target Date  09/10/19      PT LONG TERM GOAL #4   Title  Patient will exhibit improved knee AROM to >/= 120 deg flexion and 0 deg extension  to normalize transfers and functional movement    Time  8    Period  Weeks    Status  New    Target Date  09/10/19            Plan - 08/23/19 1233    Clinical Impression Statement  Pt reports less pain today. She is taking Tylenol instead of pain meds.Continued with ROM and strength as tolerated. ROM measure 113 knee flexion at end of session.    PT Next Visit Plan  Assess pt's pain level and use of meds for management    PT Home Exercise Plan  [facility]       Patient will benefit from skilled therapeutic intervention in order to improve the  following deficits and impairments:  Abnormal gait, Decreased range of motion, Difficulty walking, Decreased activity tolerance, Pain, Decreased balance, Improper body mechanics, Decreased strength, Increased edema, Postural dysfunction  Visit Diagnosis: Muscle weakness (generalized)  Localized edema  Chronic pain of left knee  Stiffness of left knee, not elsewhere classified  Other abnormalities of gait and mobility     Problem List Patient Active Problem List   Diagnosis Date Noted  . Arthritis of left knee 07/01/2019  . B12 deficiency 03/23/2019  . Anemia 02/05/2019  . Rheumatoid arthritis with rheumatoid factor of multiple sites without organ or systems involvement ([place]) 01/25/2019  . High risk medication use 01/25/2019  . Fatigue 09/18/2018  . Routine general medical examination at a health care facility 02/07/2015  . Morbid obesity ([place]) 04/19/2013  . Hyperlipidemia associated with type 2 diabetes mellitus ([place]) 07/28/2009  . Osteopenia 03/14/2009  . Diabetes mellitus type 2 with complications ([place]) 0000000  . Hypothyroidism 09/24/2006  . Essential hypertension 09/24/2006    Dorene Ar, PTA 08/23/2019, 1:13 PM  [facility] [address] [place], [place], [address] Phone: [phone]   Fax:  [phone]  Name: Marissa Roberts MRN: [id number] Date of Birth: February 09, 1946

## 2019-08-24 ENCOUNTER — Other Ambulatory Visit (INDEPENDENT_AMBULATORY_CARE_PROVIDER_SITE_OTHER): Payer: Medicare Other

## 2019-08-24 DIAGNOSIS — E039 Hypothyroidism, unspecified: Secondary | ICD-10-CM | POA: Diagnosis not present

## 2019-08-24 DIAGNOSIS — Z794 Long term (current) use of insulin: Secondary | ICD-10-CM

## 2019-08-24 DIAGNOSIS — E1165 Type 2 diabetes mellitus with hyperglycemia: Secondary | ICD-10-CM

## 2019-08-24 LAB — COMPREHENSIVE METABOLIC PANEL
ALT: 15 U/L (ref 0–35)
AST: 17 U/L (ref 0–37)
Albumin: 3.9 g/dL (ref 3.5–5.2)
Alkaline Phosphatase: 53 U/L (ref 39–117)
BUN: 11 mg/dL (ref 6–23)
CO2: 29 mEq/L (ref 19–32)
Calcium: 9.5 mg/dL (ref 8.4–10.5)
Chloride: 103 mEq/L (ref 96–112)
Creatinine, Ser: 0.72 mg/dL (ref 0.40–1.20)
GFR: 96.35 mL/min (ref >60.00)
Glucose, Bld: 83 mg/dL (ref 70–99)
Potassium: 3.8 mEq/L (ref 3.5–5.1)
Sodium: 138 mEq/L (ref 135–145)
Total Bilirubin: 0.3 mg/dL (ref 0.2–1.2)
Total Protein: 6.6 g/dL (ref 6.0–8.3)

## 2019-08-24 LAB — TSH: TSH: 1.75 u[IU]/mL (ref 0.35–4.50)

## 2019-08-24 LAB — LIPID PANEL
Cholesterol: 133 mg/dL (ref 0–200)
HDL: 59.7 mg/dL (ref >39.00)
LDL Cholesterol: 61 mg/dL (ref 0–99)
NonHDL: 73.69
Total CHOL/HDL Ratio: 2
Triglycerides: 64 mg/dL (ref 0.0–149.0)
VLDL: 12.8 mg/dL (ref 0.0–40.0)

## 2019-08-24 LAB — T4, FREE: Free T4: 1.58 ng/dL (ref 0.60–1.60)

## 2019-08-24 LAB — HEMOGLOBIN A1C: Hgb A1c MFr Bld: 5.4 % (ref 4.6–6.5)

## 2019-08-26 ENCOUNTER — Other Ambulatory Visit: Payer: Self-pay

## 2019-08-27 ENCOUNTER — Encounter: Payer: Self-pay | Admitting: Endocrinology

## 2019-08-27 ENCOUNTER — Ambulatory Visit: Payer: Medicare Other | Admitting: Endocrinology

## 2019-08-27 VITALS — BP 110/70 | HR 79 | Ht 65.0 in | Wt 230.0 lb

## 2019-08-27 DIAGNOSIS — I10 Essential (primary) hypertension: Secondary | ICD-10-CM

## 2019-08-27 DIAGNOSIS — E119 Type 2 diabetes mellitus without complications: Secondary | ICD-10-CM

## 2019-08-27 DIAGNOSIS — Z794 Long term (current) use of insulin: Secondary | ICD-10-CM

## 2019-08-27 DIAGNOSIS — E1169 Type 2 diabetes mellitus with other specified complication: Secondary | ICD-10-CM

## 2019-08-27 DIAGNOSIS — E039 Hypothyroidism, unspecified: Secondary | ICD-10-CM | POA: Diagnosis not present

## 2019-08-27 DIAGNOSIS — E785 Hyperlipidemia, unspecified: Secondary | ICD-10-CM

## 2019-08-27 NOTE — Patient Instructions (Addendum)
Tresiba 36 in am  Amlodipine 1/2 daily  Tandem iron daily  Check blood sugars on waking up 4 days a week  Also check blood sugars about 2 hours after meals and do this after different meals by rotation  Recommended blood sugar levels on waking up are 90-130 and about 2 hours after meal is 130-160  Please bring your blood sugar monitor to each visit, thank you

## 2019-08-27 NOTE — Progress Notes (Signed)
Patient ID: Marissa Roberts, female   DOB: [birth year], 74 y.o.   MRN: [id number]               Reason for Appointment:  Follow-up for diabetes and hypertension   History of Present Illness:             Type 2 diabetes mellitus, date of diagnosis: 2005       INSULIN regimen is  Tresiba 40 units am, Novolog  10-15 units bid- tid ac  Oral hypoglycemic drugs the patient is taking are: Metformin 2 g daily, Farxiga 5 mg daily   Current blood sugar patterns, management and problems identified:  Her A1c is continuing to improve and now only 5.4 compared to 6.3    Since she has been on [place] since 05/2019 she has had continued improvement in her blood sugar levels  Tyler Aas was reduced to 10 units on the last visit  Even with this reduction her blood sugars in the mornings are averaging below 100  She now says that she tries to take any NovoLog she will start feeling in blood sugar getting low and she is usually not taking this new, previously taking up to 20 units  Also blood sugars in the evenings also below 100 on an average  She is now finally trying to check a few readings after meals in the afternoon and evening although not enough, highest reading only 162 after lunch  Her weight is down another 8 pounds  She is also feeling that her appetite is decreased with using [place] has been better with healthier choices  As before she cannot do much exercise     Dinner 6-7pm Side effects from medications have been:none  Compliance with the medical regimen: Fair  Glucose monitoring:  done 1 time a day         Glucometer:  One Touch.      Blood Glucose readings from review of monitor download:   PRE-MEAL Fasting Lunch Dinner Bedtime Overall  Glucose range:  73-107   68-112    Mean/median:  85    91   POST-MEAL PC Breakfast PC Lunch PC Dinner  Glucose range:  106-118  162  80, 88  Mean/median:      Previous readings:  PRE-MEAL Fasting Lunch Dinner  Bedtime Overall  Glucose range: 56-96      Mean/median:  79     84   POST-MEAL PC Breakfast PC Lunch PC Dinner  Glucose range:   127   Mean/median:       Self-care:   Meals: 3 meals per day. Breakfast is usually grits with or without eggs or meat Not always restricting fat intake or sweet tea  when eating out        Exercise:  Minimal  Dietician visit, most recent:2014.               Weight history:  Wt Readings from Last 3 Encounters:  08/27/19 230 lb (104.3 kg)  08/09/19 228 lb 6.4 oz (103.6 kg)  07/01/19 238 lb 1.6 oz (108 kg)    Glycemic control:   Lab Results  Component Value Date   HGBA1C 5.4 08/24/2019   HGBA1C 6.3 (H) 06/22/2019   HGBA1C 6.9 (H) 04/27/2019   Lab Results  Component Value Date   MICROALBUR 41.7 (H) 01/22/2019   LDLCALC 61 08/24/2019   CREATININE 0.72 08/24/2019   No results found for: FRUCTOSAMINE    Past history:  She  thinks her blood sugars were only mildly increased at borderline levels at the onsetper Most likely she was treated with metformin initially and this has been continued.   At some point she was also tried on [place] and [place] for improving her control. She is not sure why she was started on insulin 5 years ago, presumably for worsening hyperglycemia Also not clear if she has tried various insulin regimens before starting Lantus and NovoLog  Previously had tried the V-go pump but subsequently she stopped doing the V-go pump because of the cost, skin irritation and local discomfort   Allergies as of 08/27/2019      Reactions   Contrast Media [iodinated Diagnostic Agents]    Throat swells from ct con tra   Adhesive [tape]    No paper tape, causes blisters   Erythromycin Rash   Penicillins Rash   Did it involve swelling of the face/tongue/throat, SOB, or low BP? No Did it involve sudden or severe rash/hives, skin peeling, or any reaction on the inside of your mouth or nose? No Did you need to seek medical attention  at a hospital or doctor's office? No When did it last happen?Many years If all above answers are "NO", may proceed with cephalosporin use.      Medication List       Accurate as of Aug 27, 2019  9:35 AM. If you have any questions, ask your nurse or doctor.        STOP taking these medications   aspirin 81 MG chewable tablet Stopped by: Elayne Snare, MD   oxyCODONE 5 MG immediate release tablet Commonly known as: Oxy IR/ROXICODONE Stopped by: Elayne Snare, MD     TAKE these medications   amLODipine 5 MG tablet Commonly known as: NORVASC Take 1 tablet (5 mg total) by mouth daily.   BD Pen Needle Nano U/F 32G X 4 MM Misc Generic drug: Insulin Pen Needle USE  4 TIMES DAILY TO  INJECT  INSULIN   Biotin 10000 MCG Tabs Take 10,000 mcg by mouth daily.   calcium-vitamin D 500-200 MG-UNIT tablet Commonly known as: OSCAL WITH D Take 2 tablets by mouth daily with breakfast.   Farxiga 5 MG Tabs tablet Generic drug: dapagliflozin propanediol Take 5 mg by mouth daily.   fluticasone 50 MCG/ACT nasal spray Commonly known as: FLONASE Place 2 sprays into both nostrils 2 (two) times daily as needed for allergies or rhinitis. What changed: Another medication with the same name was removed. Continue taking this medication, and follow the directions you see here. Changed by: Elayne Snare, MD   folic acid 1 MG tablet Commonly known as: FOLVITE Take 2 tablets (2 mg total) by mouth daily.   furosemide 40 MG tablet Commonly known as: LASIX Take 40 mg by mouth 2 (two) times daily. What changed: Another medication with the same name was removed. Continue taking this medication, and follow the directions you see here. Changed by: Elayne Snare, MD   gabapentin 300 MG capsule Commonly known as: NEURONTIN Take 1 capsule (300 mg total) by mouth 3 (three) times daily.   GLUCOSAMINE PO Take 1 capsule by mouth daily.   HYDROcodone-acetaminophen 5-325 MG tablet Commonly known as:  NORCO/VICODIN Take 1 tablet by mouth every 12 (twelve) hours as needed for moderate pain.   hydroxychloroquine 200 MG tablet Commonly known as: PLAQUENIL Take 1 tablet by mouth twice daily   levothyroxine 137 MCG tablet Commonly known as: SYNTHROID Take 137 mcg by mouth daily before  breakfast. What changed: Another medication with the same name was removed. Continue taking this medication, and follow the directions you see here. Changed by: Elayne Snare, MD   linaclotide 290 MCG Caps capsule Commonly known as: LINZESS Take 290 mcg by mouth daily as needed. What changed: Another medication with the same name was removed. Continue taking this medication, and follow the directions you see here. Changed by: Elayne Snare, MD   metFORMIN 1000 MG tablet Commonly known as: GLUCOPHAGE Take 1,000 mg by mouth 2 (two) times daily with a meal. What changed: Another medication with the same name was removed. Continue taking this medication, and follow the directions you see here. Changed by: Elayne Snare, MD   methocarbamol 500 MG tablet Commonly known as: Robaxin Take 1 tablet (500 mg total) by mouth every 8 (eight) hours as needed for muscle spasms.   methotrexate 2.5 MG tablet Commonly known as: RHEUMATREX Take 7 tablets (17.5 mg total) by mouth once a week. PROTECT FROM LIGHT   montelukast 10 MG tablet Commonly known as: SINGULAIR Take 1 tablet (10 mg total) by mouth at bedtime.   multivitamin with minerals Tabs tablet Take 1 tablet by mouth daily.   NovoLOG FlexPen 100 UNIT/ML FlexPen Generic drug: insulin aspart Inject 15-22 units under the skin three times daily before meals.   OneTouch Delica Lancets [id number] Misc USE TO CHECK BLOOD SUGAR UP TO THREE TIMES A DAY 2 HOURS AFTER A MEAL AND UP TO 3 TIMES A WEEK UPON WAKING UP   OneTouch Verio test strip Generic drug: glucose blood Use Onetouch verio test strips as instructed to check blood sugars 4 times daily. DX:E11.65   potassium  chloride 10 MEQ tablet Commonly known as: KLOR-CON Take 1 tablet by mouth twice daily   rosuvastatin 40 MG tablet Commonly known as: CRESTOR Take 1 tablet (40 mg total) by mouth daily. Annual appt due in June must see provider for future refills   Tresiba FlexTouch 100 UNIT/ML FlexTouch Pen Generic drug: insulin degludec Inject 40 Units into the skin daily. What changed: Another medication with the same name was removed. Continue taking this medication, and follow the directions you see here. Changed by: Elayne Snare, MD   valsartan-hydrochlorothiazide 320-12.5 MG tablet Commonly known as: DIOVAN-HCT Take 1 tablet by mouth once daily       Allergies:  Allergies  Allergen Reactions  . Contrast Media [Iodinated Diagnostic Agents]     Throat swells from ct con tra  . Adhesive [Tape]     No paper tape, causes blisters  . Erythromycin Rash  . Penicillins Rash    Did it involve swelling of the face/tongue/throat, SOB, or low BP? No Did it involve sudden or severe rash/hives, skin peeling, or any reaction on the inside of your mouth or nose? No Did you need to seek medical attention at a hospital or doctor's office? No When did it last happen?Many years If all above answers are "NO", may proceed with cephalosporin use.     Past Medical History:  Diagnosis Date  . Arthritis   . Bronchitis    hx of  . Diabetes mellitus   . Family history of adverse reaction to anesthesia    per patient, "daughter threw up after a knee surgery"  . GERD (gastroesophageal reflux disease)   . Hypercholesteremia   . Hypertension   . Hypothyroidism   . Pneumonia    hx of  . PONV (postoperative nausea and vomiting)   . Thyroid disease  Past Surgical History:  Procedure Laterality Date  . ABDOMINAL HYSTERECTOMY     partial  . BREAST SURGERY     lumpectomy left breast  . carpel tunnel    . COLONOSCOPY W/ POLYPECTOMY    . FOOT SURGERY    . knee arhtroscopy    . KNEE ARTHROPLASTY     . SHOULDER ARTHROSCOPY WITH SUBACROMIAL DECOMPRESSION, ROTATOR CUFF REPAIR AND BICEP TENDON REPAIR Right 05/26/2012   Procedure: Right Shoulder Diagnostic Operative Arthroscopy, Subacromial Decompression, Biceps Tenodesis, Mini Open Rotator Cuff Repair;  Surgeon: [name], MD;  Location: [facility];  Service: Orthopedics;  Laterality: Right;  Right Shoulder Diagnostic Operative Arthroscopy, Subacromial Decompression, Biceps Tenodesis, Mini Open Rotator Cuff Repair  . TONSILLECTOMY    . TOTAL KNEE ARTHROPLASTY Left 07/01/2019   Procedure: LEFT TOTAL KNEE ARTHROPLASTY;  Surgeon: [name], MD;  Location: [facility];  Service: Orthopedics;  Laterality: Left;    Family History  Problem Relation Age of Onset  . Diabetes Mother   . Heart disease Mother   . Hyperlipidemia Mother   . Diabetes Father   . Diabetes Sister   . Hypertension Sister   . Heart disease Sister   . Dementia Sister   . Heart disease Brother   . Healthy Daughter   . Healthy Daughter   . Healthy Daughter   . Healthy Son     Social History:  reports that she quit smoking about 29 years ago. She has never used smokeless tobacco. She reports previous alcohol use. She reports that she does not use drugs.    Review of Systems   HYPERTENSION: She is on amlodipine 5 mg once daily and Diovan HCT 320/12.5  Also has microalbuminuria and because of this as well as tendency to edema she was tried on diltiazem but she felt that this was causing hair loss and constipation and is back on amlodipine  Her blood pressure appears to be relatively low this month, no lightheadedness  She is not checking blood pressure at home  BP Readings from Last 3 Encounters:  08/27/19 110/70  08/09/19 116/67  07/04/19 (!) 132/46    On Lasix 40 mg for edema daily with good control The dose was reduced when starting [place]   LIPIDS:  She is taking rosuvastatin 40 mg, previously LDL was high with simvastatin 20 mg LDL below  100 Crestor prescribed by PCP      Lab Results  Component Value Date   CHOL 133 08/24/2019   HDL 59.70 08/24/2019   LDLCALC 61 08/24/2019   LDLDIRECT 129.5 02/06/2012   TRIG 64.0 08/24/2019   CHOLHDL 2 08/24/2019                  Thyroid:      She has been hypothyroid for over 40 years and is taking a supplement of 137 g levothyroxine, 6 tablets per week She takes levothyroxine before breakfast daily She feels fairly well Has not needed a change in her dosage for some time  TSH has been consistently normal  Last labs:   Lab Results  Component Value Date   TSH 1.75 08/24/2019   TSH 1.24 01/22/2019   TSH 0.63 07/21/2018   FREET4 1.58 08/24/2019   FREET4 1.11 07/21/2018   FREET4 1.05 10/10/2017   Iron deficiency anemia: She has constipation from iron supplements and is only taking a multivitamin, did not try tandem as recommended the last couple of times  Still has persistent anemia  Lab  Results  Component Value Date   HGB 10.0 (L) 08/09/2019     Physical Examination:  BP 110/70 (BP Location: Left Arm, Patient Position: Sitting, Cuff Size: Large)   Pulse 79   Ht 5\' 5"  (1.651 m)   Wt 230 lb (104.3 kg)   SpO2 99%   BMI 38.27 kg/m     ASSESSMENT/PLAN:  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:  See history of present illness for detailed discussion of current diabetes management, blood sugar patterns and problems identified  She is on basal bolus insulin, Farxiga and metformin  A1c is excellent at 5.4  She has had dramatic improvement in her blood sugar control and reduction in her insulin requirement along with weight loss with adding [place] about 3 months ago Her blood sugars are averaging in the 91 at home including some readings after meals Lowest blood sugar was 68 before dinner  Also has done better with her diet and her appetite has been better controlled  Recommendations:   Reduce TRESIBA by another 4 units and use only 36  No need to restart  NovoLog unless blood sugars are consistently over 160 after meals  Continue 5 mg dose of Farxiga as she is getting good results with this  Make sure she has balanced meals with some protein consistently  May reduce checking her blood sugar every morning and more readings after meals  HYPERTENSION with microalbuminuria: Blood pressure is relatively lower She can reduce amlodipine to 2.5 mg and may consider stopping it if blood pressure stays low normal Better controlled and may be benefiting from adding [place] also She will stay with 5 mg amlodipine as this is not causing edema  Renal function stable with continuing Farxiga  EDEMA: Now needing only 40 mg Lasix  HYPOTHYROIDISM: her TSH is again normal and will have this checked every 6 months  LIPIDS: LDL is excellent at 61  IRON deficiency: Hemoglobin is still very low and appears to be not improving with multivitamin recommended by PCP Again reminded her to try getting tandem brand OTC from [employer] for effective iron supplementation   There are no Patient Instructions on file for this visit.   Elayne Snare 08/27/2019, 9:35 AM   Note: This office note was prepared with Dragon voice recognition system technology. Any transcriptional errors that result from this process are unintentional.

## 2019-08-31 ENCOUNTER — Other Ambulatory Visit: Payer: Self-pay | Admitting: Endocrinology

## 2019-08-31 ENCOUNTER — Other Ambulatory Visit: Payer: Self-pay | Admitting: Rheumatology

## 2019-08-31 ENCOUNTER — Encounter: Payer: Self-pay | Admitting: Physical Therapy

## 2019-08-31 ENCOUNTER — Other Ambulatory Visit: Payer: Self-pay

## 2019-08-31 ENCOUNTER — Telehealth: Payer: Self-pay | Admitting: Internal Medicine

## 2019-08-31 ENCOUNTER — Ambulatory Visit: Payer: Medicare Other | Attending: Surgical | Admitting: Physical Therapy

## 2019-08-31 DIAGNOSIS — G8929 Other chronic pain: Secondary | ICD-10-CM | POA: Insufficient documentation

## 2019-08-31 DIAGNOSIS — M25562 Pain in left knee: Secondary | ICD-10-CM | POA: Diagnosis not present

## 2019-08-31 DIAGNOSIS — M25662 Stiffness of left knee, not elsewhere classified: Secondary | ICD-10-CM

## 2019-08-31 DIAGNOSIS — M6281 Muscle weakness (generalized): Secondary | ICD-10-CM | POA: Insufficient documentation

## 2019-08-31 DIAGNOSIS — R600 Localized edema: Secondary | ICD-10-CM | POA: Insufficient documentation

## 2019-08-31 DIAGNOSIS — R2689 Other abnormalities of gait and mobility: Secondary | ICD-10-CM | POA: Insufficient documentation

## 2019-08-31 NOTE — Telephone Encounter (Signed)
Last Visit: 08/09/2019 Next visit: 01/10/2020  Okay to refill per Dr. Estanislado Pandy

## 2019-08-31 NOTE — Telephone Encounter (Signed)
New Message:   Pt is calling and states she just left her endo Dr and she states her iron is still very low.  Pt states she has been taking the multi-vitamin for the last few months now as advised but it is not getting better. Please advise.

## 2019-08-31 NOTE — Telephone Encounter (Signed)
I don't see that her iron has actually been checked since we checked it in December. Would recommend follow up visit with [place] to discuss.

## 2019-08-31 NOTE — Telephone Encounter (Signed)
Please advise 

## 2019-08-31 NOTE — Therapy (Signed)
[place], [place], [address] Phone: [phone]   Fax:  [phone]  Physical Therapy Treatment  Patient Details  Name: Marissa Roberts MRN: [id number] Date of Birth: 1945/10/22 Referring Provider (PT): Donella Stade, [place]   Encounter Date: 08/31/2019  PT End of Session - 08/31/19 1243    Visit Number  11    Number of Visits  16    Date for PT Re-Evaluation  09/10/19    Authorization Type  [employer] MCR    PT Start Time  [id number]    PT Stop Time  1355    PT Time Calculation (min)  40 min       Past Medical History:  Diagnosis Date  . Arthritis   . Bronchitis    hx of  . Diabetes mellitus   . Family history of adverse reaction to anesthesia    per patient, "daughter threw up after a knee surgery"  . GERD (gastroesophageal reflux disease)   . Hypercholesteremia   . Hypertension   . Hypothyroidism   . Pneumonia    hx of  . PONV (postoperative nausea and vomiting)   . Thyroid disease     Past Surgical History:  Procedure Laterality Date  . ABDOMINAL HYSTERECTOMY     partial  . BREAST SURGERY     lumpectomy left breast  . carpel tunnel    . COLONOSCOPY W/ POLYPECTOMY    . FOOT SURGERY    . knee arhtroscopy    . KNEE ARTHROPLASTY    . SHOULDER ARTHROSCOPY WITH SUBACROMIAL DECOMPRESSION, ROTATOR CUFF REPAIR AND BICEP TENDON REPAIR Right 05/26/2012   Procedure: Right Shoulder Diagnostic Operative Arthroscopy, Subacromial Decompression, Biceps Tenodesis, Mini Open Rotator Cuff Repair;  Surgeon: [name], MD;  Location: [facility];  Service: Orthopedics;  Laterality: Right;  Right Shoulder Diagnostic Operative Arthroscopy, Subacromial Decompression, Biceps Tenodesis, Mini Open Rotator Cuff Repair  . TONSILLECTOMY    . TOTAL KNEE ARTHROPLASTY Left 07/01/2019   Procedure: LEFT TOTAL KNEE ARTHROPLASTY;  Surgeon: [name], MD;  Location: [facility];  Service: Orthopedics;  Laterality: Left;     There were no vitals filed for this visit.  Subjective Assessment - 08/31/19 1232    Subjective  Didn't do many exercises over the holiday weekend.    Currently in Pain?  No/denies         [facility] PT Assessment - 08/31/19 0001      AROM   Left Knee Flexion  --   115 end of session                   [facility] Adult PT Treatment/Exercise - 08/31/19 0001      Knee/Hip Exercises: Stretches   Active Hamstring Stretch  2 reps    Active Hamstring Stretch Limitations  long sitting     Hip Flexor Stretch  3 reps;20 seconds    Hip Flexor Stretch Limitations  supine edge of mat    Knee: Self-Stretch Limitations  Seated scoot stretch for knee flexion     Gastroc Stretch  2 reps;20 seconds    Gastroc Stretch Limitations  slant board      Knee/Hip Exercises: Aerobic   Recumbent Bike  L1 5 min for knee flexion;       Knee/Hip Exercises: Machines for Strengthening   Cybex Knee Extension  5# bilat and single , then bilat for concentric, LEft eccentric; 10x2    Cybex Knee Flexion  20#  bilateral; 10x2   eccentric Left focus     Knee/Hip Exercises: Standing   Lateral Step Up  2 sets;10 reps;Step Height: 6"    Forward Step Up  2 sets;10 reps;Step Height: 6"    Step Down  2 sets;10 reps;Step Height: 4"    SLS  5 sec best on each     Other Standing Knee Exercises  3 way hip with SLS on left - needs light UE touch       Knee/Hip Exercises: Supine   Quad Sets  10 reps   5 sec hold   Straight Leg Raises  10 reps;2 sets               PT Short Term Goals - 08/02/19 1125      PT SHORT TERM GOAL #1   Title  Patient will be I with initial HEP to progress in PT    Time  4    Period  Weeks    Status  On-going    Target Date  08/13/19      PT SHORT TERM GOAL #2   Title  Patient will exhibit >/= 105 deg knee flexion to improve transfers    Time  4    Period  Weeks    Status  On-going    Target Date  08/13/19      PT SHORT TERM GOAL #3   Title  Patient will be able  to walk household distances without AD or gait deviation to improve ability to perform light household tasks    Time  4    Period  Weeks    Status  Achieved      PT SHORT TERM GOAL #4   Title  Patient will report knee pain with activity </= 3/10 to reduce compensation and improve functional ability    Time  4    Period  Weeks    Status  On-going    Target Date  08/13/19        PT Long Term Goals - 07/16/19 1238      PT LONG TERM GOAL #1   Title  Patient will be I with final HEP to maintain progress from PT    Time  8    Period  Weeks    Status  New    Target Date  09/10/19      PT LONG TERM GOAL #2   Title  Patient will be able to walk 1 mile without AD and only a little bit of difficulty to improve community access and shopping ability    Time  8    Period  Weeks    Status  New    Target Date  09/10/19      PT LONG TERM GOAL #3   Title  Patient will exhibit left knee strength 5/5 MMT to perform heavy household tasks and negotiate stairs with little to no difficulty    Time  8    Period  Weeks    Status  New    Target Date  09/10/19      PT LONG TERM GOAL #4   Title  Patient will exhibit improved knee AROM to >/= 120 deg flexion and 0 deg extension to normalize transfers and functional movement    Time  8    Period  Weeks    Status  New    Target Date  09/10/19            Plan -  08/31/19 1355    Clinical Impression Statement  Pt measure 115 at end of session. She isprogressing well with ROM. Continued with strengthening and ROM with good tolerance to session.    PT Next Visit Plan  continued ROM and strength, finalize HEP    PT Home Exercise Plan  [facility]       Patient will benefit from skilled therapeutic intervention in order to improve the following deficits and impairments:  Abnormal gait, Decreased range of motion, Difficulty walking, Decreased activity tolerance, Pain, Decreased balance, Improper body mechanics, Decreased strength, Increased edema,  Postural dysfunction  Visit Diagnosis: Muscle weakness (generalized)  Localized edema  Chronic pain of left knee  Stiffness of left knee, not elsewhere classified  Other abnormalities of gait and mobility     Problem List Patient Active Problem List   Diagnosis Date Noted  . Arthritis of left knee 07/01/2019  . B12 deficiency 03/23/2019  . Anemia 02/05/2019  . Rheumatoid arthritis with rheumatoid factor of multiple sites without organ or systems involvement ([place]) 01/25/2019  . High risk medication use 01/25/2019  . Fatigue 09/18/2018  . Routine general medical examination at a health care facility 02/07/2015  . Morbid obesity ([place]) 04/19/2013  . Hyperlipidemia associated with type 2 diabetes mellitus ([place]) 07/28/2009  . Osteopenia 03/14/2009  . Diabetes mellitus type 2 with complications ([place]) 0000000  . Hypothyroidism 09/24/2006  . Essential hypertension 09/24/2006    Dorene Ar, PTA 08/31/2019, 1:57 PM  [facility] [address] [place], [place], [address] Phone: [phone]   Fax:  [phone]  Name: DONI SUNDERLAND MRN: [id number] Date of Birth: 1945-10-27

## 2019-09-01 NOTE — Telephone Encounter (Signed)
Called lvm for call back to office 

## 2019-09-02 ENCOUNTER — Other Ambulatory Visit: Payer: Self-pay

## 2019-09-02 ENCOUNTER — Encounter: Payer: Self-pay | Admitting: Physical Therapy

## 2019-09-02 ENCOUNTER — Ambulatory Visit: Payer: Medicare Other | Admitting: Physical Therapy

## 2019-09-02 DIAGNOSIS — G8929 Other chronic pain: Secondary | ICD-10-CM | POA: Diagnosis not present

## 2019-09-02 DIAGNOSIS — M6281 Muscle weakness (generalized): Secondary | ICD-10-CM

## 2019-09-02 DIAGNOSIS — M25662 Stiffness of left knee, not elsewhere classified: Secondary | ICD-10-CM

## 2019-09-02 DIAGNOSIS — M25562 Pain in left knee: Secondary | ICD-10-CM | POA: Diagnosis not present

## 2019-09-02 DIAGNOSIS — R600 Localized edema: Secondary | ICD-10-CM | POA: Diagnosis not present

## 2019-09-02 DIAGNOSIS — R2689 Other abnormalities of gait and mobility: Secondary | ICD-10-CM

## 2019-09-02 NOTE — Therapy (Signed)
[place], [place], [address] Phone: [phone]   Fax:  [phone]  Physical Therapy Treatment  Patient Details  Name: Marissa Roberts MRN: [id number] Date of Birth: 10/15/1945 Referring Provider (PT): Donella Stade, [place]   Encounter Date: 09/02/2019  PT End of Session - 09/02/19 0946    Visit Number  12    Number of Visits  16    Date for PT Re-Evaluation  09/10/19    Authorization Type  BC[employer] MCR    PT Start Time  1000    PT Stop Time  1040    PT Time Calculation (min)  40 min    Activity Tolerance  Patient tolerated treatment well    Behavior During Therapy  [facility] for tasks assessed/performed       Past Medical History:  Diagnosis Date  . Arthritis   . Bronchitis    hx of  . Diabetes mellitus   . Family history of adverse reaction to anesthesia    per patient, "daughter threw up after a knee surgery"  . GERD (gastroesophageal reflux disease)   . Hypercholesteremia   . Hypertension   . Hypothyroidism   . Pneumonia    hx of  . PONV (postoperative nausea and vomiting)   . Thyroid disease     Past Surgical History:  Procedure Laterality Date  . ABDOMINAL HYSTERECTOMY     partial  . BREAST SURGERY     lumpectomy left breast  . carpel tunnel    . COLONOSCOPY W/ POLYPECTOMY    . FOOT SURGERY    . knee arhtroscopy    . KNEE ARTHROPLASTY    . SHOULDER ARTHROSCOPY WITH SUBACROMIAL DECOMPRESSION, ROTATOR CUFF REPAIR AND BICEP TENDON REPAIR Right 05/26/2012   Procedure: Right Shoulder Diagnostic Operative Arthroscopy, Subacromial Decompression, Biceps Tenodesis, Mini Open Rotator Cuff Repair;  Surgeon: [name], MD;  Location: [place];  Service: Orthopedics;  Laterality: Right;  Right Shoulder Diagnostic Operative Arthroscopy, Subacromial Decompression, Biceps Tenodesis, Mini Open Rotator Cuff Repair  . TONSILLECTOMY    . TOTAL KNEE ARTHROPLASTY Left 07/01/2019   Procedure: LEFT  TOTAL KNEE ARTHROPLASTY;  Surgeon: [name], MD;  Location: [place];  Service: Orthopedics;  Laterality: Left;    There were no vitals filed for this visit.  Subjective Assessment - 09/02/19 0945    Subjective  Patient reports she is doing well.    Patient Stated Goals  Patient wants to walk without pain, get down and get up when she wants, act spontaneous without pain or fear    Currently in Pain?  No/denies         [facility] PT Assessment - 09/02/19 0001      AROM   Left Knee Flexion  116   measured post stretching                   [facility] Adult PT Treatment/Exercise - 09/02/19 0001      Knee/Hip Exercises: Stretches   Hip Flexor Stretch  3 reps;30 seconds    Hip Flexor Stretch Limitations  supine edge of mat    Gastroc Stretch  3 reps;20 seconds    Gastroc Stretch Limitations  standing at counter      Knee/Hip Exercises: Aerobic   Recumbent Bike  L2 x 5 min for knee flexion;       Knee/Hip Exercises: Standing   Knee Flexion  2 sets;10 reps    Knee Flexion Limitations  hamstring curl    Hip Flexion  2 sets;10 reps    Hip Flexion Limitations  marching    Hip Abduction  2 sets;10 reps    Hip Extension  2 sets;10 reps    Forward Step Up  2 sets;10 reps    Forward Step Up Limitations  8" step height      Knee/Hip Exercises: Seated   Sit to Sand  2 sets;10 reps      Knee/Hip Exercises: Supine   Bridges  2 sets;10 reps    Straight Leg Raises  2 sets;10 reps             PT Education - 09/02/19 0946    Education Details  HEP    Person(s) Educated  Patient    Methods  Explanation;Demonstration;Verbal cues    Comprehension  Verbalized understanding;Returned demonstration;Verbal cues required;Need further instruction       PT Short Term Goals - 09/02/19 0947      PT SHORT TERM GOAL #1   Title  Patient will be I with initial HEP to progress in PT    Time  4    Period  Weeks    Status  Achieved    Target Date  08/13/19      PT SHORT TERM  GOAL #2   Title  Patient will exhibit >/= 105 deg knee flexion to improve transfers    Time  4    Period  Weeks    Status  Achieved    Target Date  08/13/19      PT SHORT TERM GOAL #3   Title  Patient will be able to walk household distances without AD or gait deviation to improve ability to perform light household tasks    Time  4    Period  Weeks    Status  Achieved      PT SHORT TERM GOAL #4   Title  Patient will report knee pain with activity </= 3/10 to reduce compensation and improve functional ability    Time  4    Period  Weeks    Status  Achieved    Target Date  08/13/19        PT Long Term Goals - 09/02/19 1026      PT LONG TERM GOAL #1   Title  Patient will be I with final HEP to maintain progress from PT    Time  8    Period  Weeks    Status  On-going    Target Date  09/10/19      PT LONG TERM GOAL #2   Title  Patient will be able to walk 1 mile without AD and only a little bit of difficulty to improve community access and shopping ability    Time  8    Period  Weeks    Status  Achieved    Target Date  09/10/19      PT LONG TERM GOAL #3   Title  Patient will exhibit left knee strength 5/5 MMT to perform heavy household tasks and negotiate stairs with little to no difficulty    Time  8    Period  Weeks    Status  On-going    Target Date  09/10/19      PT LONG TERM GOAL #4   Title  Patient will exhibit improved knee AROM to >/= 120 deg flexion and 0 deg extension to normalize transfers and functional movement    Time  8    Period  Weeks    Status  On-going    Target Date  09/10/19            Plan - 09/02/19 0947    Clinical Impression Statement  Patient tolerated therapy well with no adverse effects. She continues to progress well with strengthening exercises and demonstrates continues improvement in knee active motion. She does exhibit impairment with balance and was encouraged to work on this at home. At this point it is likely she will only  need one more visit to finalize HEP and ensure independence at home.    PT Treatment/Interventions  ADLs/Self Care Home Management;Cryotherapy;Electrical Stimulation;Moist Heat;Neuromuscular re-education;Balance training;Therapeutic exercise;Therapeutic activities;Functional mobility training;Stair training;Gait training;Patient/family education;Manual techniques;Dry needling;Passive range of motion;Taping;Joint Manipulations;Vasopneumatic Device    PT Next Visit Plan  continued ROM and strength, finalize HEP    PT Home Exercise Plan  [email or web address]    Consulted and Agree with Plan of Care  Patient       Patient will benefit from skilled therapeutic intervention in order to improve the following deficits and impairments:  Abnormal gait, Decreased range of motion, Difficulty walking, Decreased activity tolerance, Pain, Decreased balance, Improper body mechanics, Decreased strength, Increased edema, Postural dysfunction  Visit Diagnosis: Chronic pain of left knee  Muscle weakness (generalized)  Stiffness of left knee, not elsewhere classified  Localized edema  Other abnormalities of gait and mobility     Problem List Patient Active Problem List   Diagnosis Date Noted  . Arthritis of left knee 07/01/2019  . B12 deficiency 03/23/2019  . Anemia 02/05/2019  . Rheumatoid arthritis with rheumatoid factor of multiple sites without organ or systems involvement ([place]) 01/25/2019  . High risk medication use 01/25/2019  . Fatigue 09/18/2018  . Routine general medical examination at a health care facility 02/07/2015  . Morbid obesity ([place]) 04/19/2013  . Hyperlipidemia associated with type 2 diabetes mellitus ([place]) 07/28/2009  . Osteopenia 03/14/2009  . Diabetes mellitus type 2 with complications ([place]) 0000000  . Hypothyroidism 09/24/2006  . Essential hypertension 09/24/2006    Hilda Blades, PT, DPT, LAT, ATC 09/02/19  10:41 AM Phone: [phone] Fax: [facility] [address] [place], [place], [address] Phone: [phone]   Fax:  [phone]  Name: Marissa Roberts MRN: [id number] Date of Birth: [birth year]

## 2019-09-02 NOTE — Patient Instructions (Signed)
Access Code: [email or web address] URL: [email or web address] Date: 07/16/2019 Prepared by: Hilda Blades  Exercises Modified Marcello Moores Stretch - 2 x daily - 7 x weekly - 3 reps - 20 seconds hold Seated Hamstring Stretch - 2 x daily - 7 x weekly - 3 reps - 20 seconds hold Standing Gastroc Stretch at Counter - 2 x daily - 7 x weekly - 3 reps - 20 seconds hold Seated Knee Flexion Stretch - 2 x daily - 7 x weekly - 10 reps - 10 seconds hold Hooklying Straight Leg Raise - 2 x daily - 7 x weekly - 10 reps Sit to Stand without Arm Support - 1 x daily - 7 x weekly - 2 sets - 10 reps Standing March with Counter Support - 1 x daily - 7 x weekly - 10 reps - 2 sets Standing Hip Abduction with Counter Support - 1 x daily - 7 x weekly - 10 reps - 2 sets Standing Hip Extension with Counter Support - 1 x daily - 7 x weekly - 10 reps - 2 sets Standing Knee Flexion with Counter Support - 1 x daily - 7 x weekly - 2 sets - 10 reps Heel rises with counter support - 1 x daily - 7 x weekly - 2 sets - 20 reps

## 2019-09-03 ENCOUNTER — Other Ambulatory Visit: Payer: Self-pay | Admitting: Rheumatology

## 2019-09-03 ENCOUNTER — Ambulatory Visit (INDEPENDENT_AMBULATORY_CARE_PROVIDER_SITE_OTHER): Payer: BC Managed Care – PPO | Admitting: Podiatry

## 2019-09-03 ENCOUNTER — Encounter: Payer: Self-pay | Admitting: Podiatry

## 2019-09-03 DIAGNOSIS — B351 Tinea unguium: Secondary | ICD-10-CM | POA: Diagnosis not present

## 2019-09-03 DIAGNOSIS — E119 Type 2 diabetes mellitus without complications: Secondary | ICD-10-CM

## 2019-09-03 DIAGNOSIS — L84 Corns and callosities: Secondary | ICD-10-CM | POA: Diagnosis not present

## 2019-09-03 DIAGNOSIS — M79675 Pain in left toe(s): Secondary | ICD-10-CM

## 2019-09-03 DIAGNOSIS — M79674 Pain in right toe(s): Secondary | ICD-10-CM | POA: Diagnosis not present

## 2019-09-03 DIAGNOSIS — Z794 Long term (current) use of insulin: Secondary | ICD-10-CM | POA: Diagnosis not present

## 2019-09-03 NOTE — Patient Instructions (Signed)
Diabetes Mellitus and Foot Care Foot care is an important part of your health, especially when you have diabetes. Diabetes may cause you to have problems because of poor blood flow (circulation) to your feet and legs, which can cause your skin to:  Become thinner and drier.  Break more easily.  Heal more slowly.  Peel and crack. You may also have nerve damage (neuropathy) in your legs and feet, causing decreased feeling in them. This means that you may not notice minor injuries to your feet that could lead to more serious problems. Noticing and addressing any potential problems early is the best way to prevent future foot problems. How to care for your feet Foot hygiene  Wash your feet daily with warm water and mild soap. Do not use hot water. Then, pat your feet and the areas between your toes until they are completely dry. Do not soak your feet as this can dry your skin.  Trim your toenails straight across. Do not dig under them or around the cuticle. File the edges of your nails with an emery board or nail file.  Apply a moisturizing lotion or petroleum jelly to the skin on your feet and to dry, brittle toenails. Use lotion that does not contain alcohol and is unscented. Do not apply lotion between your toes. Shoes and socks  Wear clean socks or stockings every day. Make sure they are not too tight. Do not wear knee-high stockings since they may decrease blood flow to your legs.  Wear shoes that fit properly and have enough cushioning. Always look in your shoes before you put them on to be sure there are no objects inside.  To break in new shoes, wear them for just a few hours a day. This prevents injuries on your feet. Wounds, scrapes, corns, and calluses  Check your feet daily for blisters, cuts, bruises, sores, and redness. If you cannot see the bottom of your feet, use a mirror or ask someone for help.  Do not cut corns or calluses or try to remove them with medicine.  If you  find a minor scrape, cut, or break in the skin on your feet, keep it and the skin around it clean and dry. You may clean these areas with mild soap and water. Do not clean the area with peroxide, alcohol, or iodine.  If you have a wound, scrape, corn, or callus on your foot, look at it several times a day to make sure it is healing and not infected. Check for: ? Redness, swelling, or pain. ? Fluid or blood. ? Warmth. ? Pus or a bad smell. General instructions  Do not cross your legs. This may decrease blood flow to your feet.  Do not use heating pads or hot water bottles on your feet. They may burn your skin. If you have lost feeling in your feet or legs, you may not know this is happening until it is too late.  Protect your feet from hot and cold by wearing shoes, such as at the beach or on hot pavement.  Schedule a complete foot exam at least once a year (annually) or more often if you have foot problems. If you have foot problems, report any cuts, sores, or bruises to your health care provider immediately. Contact a health care provider if:  You have a medical condition that increases your risk of infection and you have any cuts, sores, or bruises on your feet.  You have an injury that is not   healing.  You have redness on your legs or feet.  You feel burning or tingling in your legs or feet.  You have pain or cramps in your legs and feet.  Your legs or feet are numb.  Your feet always feel cold.  You have pain around a toenail. Get help right away if:  You have a wound, scrape, corn, or callus on your foot and: ? You have pain, swelling, or redness that gets worse. ? You have fluid or blood coming from the wound, scrape, corn, or callus. ? Your wound, scrape, corn, or callus feels warm to the touch. ? You have pus or a bad smell coming from the wound, scrape, corn, or callus. ? You have a fever. ? You have a red line going up your leg. Summary  Check your feet every day  for cuts, sores, red spots, swelling, and blisters.  Moisturize feet and legs daily.  Wear shoes that fit properly and have enough cushioning.  If you have foot problems, report any cuts, sores, or bruises to your health care provider immediately.  Schedule a complete foot exam at least once a year (annually) or more often if you have foot problems. This information is not intended to replace advice given to you by your health care provider. Make sure you discuss any questions you have with your health care provider. Document Revised: 12/09/2018 Document Reviewed: 04/19/2016 Elsevier Patient Education  2020 Elsevier Inc.  

## 2019-09-03 NOTE — Telephone Encounter (Signed)
Last Visit: 08/09/2019 Next Visit: 01/10/2020 Labs: 08/09/2019 Hgb and hct are low but stable. WBC count is WNL. CMP WNL  Current Dose per office note 08/09/2019: MTX 7 tablets po once weekly  Okay to refill per Dr. Estanislado Pandy

## 2019-09-06 ENCOUNTER — Other Ambulatory Visit: Payer: Self-pay | Admitting: Endocrinology

## 2019-09-07 ENCOUNTER — Encounter: Payer: Self-pay | Admitting: Physical Therapy

## 2019-09-07 ENCOUNTER — Other Ambulatory Visit: Payer: Self-pay

## 2019-09-07 ENCOUNTER — Ambulatory Visit: Payer: Medicare Other | Admitting: Physical Therapy

## 2019-09-07 DIAGNOSIS — R2689 Other abnormalities of gait and mobility: Secondary | ICD-10-CM | POA: Diagnosis not present

## 2019-09-07 DIAGNOSIS — G8929 Other chronic pain: Secondary | ICD-10-CM | POA: Diagnosis not present

## 2019-09-07 DIAGNOSIS — M6281 Muscle weakness (generalized): Secondary | ICD-10-CM

## 2019-09-07 DIAGNOSIS — M25562 Pain in left knee: Secondary | ICD-10-CM | POA: Diagnosis not present

## 2019-09-07 DIAGNOSIS — M25662 Stiffness of left knee, not elsewhere classified: Secondary | ICD-10-CM | POA: Diagnosis not present

## 2019-09-07 DIAGNOSIS — R600 Localized edema: Secondary | ICD-10-CM

## 2019-09-07 NOTE — Therapy (Signed)
[place], [place], [address] Phone: [phone]   Fax:  [phone]  Physical Therapy Treatment / Discharge  Patient Details  Name: Marissa Roberts MRN: [id number] Date of Birth: 18-Jun-1945 Referring Provider (PT): Donella Stade, [place]   Encounter Date: 09/07/2019  PT End of Session - 09/07/19 1221    Visit Number  13    Number of Visits  16    Date for PT Re-Evaluation  09/10/19    Authorization Type  BCBS MCR    PT Start Time  1216    PT Stop Time  4656    PT Time Calculation (min)  39 min    Activity Tolerance  Patient tolerated treatment well;No increased pain    Behavior During Therapy  WFL for tasks assessed/performed       Past Medical History:  Diagnosis Date  . Arthritis   . Bronchitis    hx of  . Diabetes mellitus   . Family history of adverse reaction to anesthesia    per patient, "daughter threw up after a knee surgery"  . GERD (gastroesophageal reflux disease)   . Hypercholesteremia   . Hypertension   . Hypothyroidism   . Pneumonia    hx of  . PONV (postoperative nausea and vomiting)   . Thyroid disease     Past Surgical History:  Procedure Laterality Date  . ABDOMINAL HYSTERECTOMY     partial  . BREAST SURGERY     lumpectomy left breast  . carpel tunnel    . COLONOSCOPY W/ POLYPECTOMY    . FOOT SURGERY    . knee arhtroscopy    . KNEE ARTHROPLASTY    . SHOULDER ARTHROSCOPY WITH SUBACROMIAL DECOMPRESSION, ROTATOR CUFF REPAIR AND BICEP TENDON REPAIR Right 05/26/2012   Procedure: Right Shoulder Diagnostic Operative Arthroscopy, Subacromial Decompression, Biceps Tenodesis, Mini Open Rotator Cuff Repair;  Surgeon: [name], MD;  Location: [place];  Service: Orthopedics;  Laterality: Right;  Right Shoulder Diagnostic Operative Arthroscopy, Subacromial Decompression, Biceps Tenodesis, Mini Open Rotator Cuff Repair  . TONSILLECTOMY    . TOTAL KNEE ARTHROPLASTY Left  07/01/2019   Procedure: LEFT TOTAL KNEE ARTHROPLASTY;  Surgeon: [name], MD;  Location: [place];  Service: Orthopedics;  Laterality: Left;    There were no vitals filed for this visit.  Subjective Assessment - 09/07/19 1220    Subjective  Patient reports she is doing well. She is ready to be done with therapy.    Patient Stated Goals  Patient wants to walk without pain, get down and get up when she wants, act spontaneous without pain or fear    Currently in Pain?  No/denies         [facility] PT Assessment - 09/07/19 0001      Assessment   Medical Diagnosis  Status post total left knee replacement    Referring Provider (PT)  Magnant, Gerrianne Scale, PA-C    Onset Date/Surgical Date  07/01/19      Precautions   Precautions  None      Restrictions   Weight Bearing Restrictions  No      Balance Screen   Has the patient fallen in the past 6 months  No      Prior Function   Level of Independence  Independent      Observation/Other Assessments   Observations  Patient appears in no apparent distress    Focus on Therapeutic Outcomes (FOTO)   23% limitation  AROM   Left Knee Extension  0    Left Knee Flexion  120      Strength   Right Knee Flexion  5/5    Right Knee Extension  5/5    Left Knee Flexion  5/5    Left Knee Extension  5/5      Transfers   Transfers  Independent with all Transfers      Ambulation/Gait   Ambulation/Gait  Yes    Ambulation/Gait Assistance  7: Independent    Gait Pattern  Within Functional Limits                    [facility] Adult PT Treatment/Exercise - 09/07/19 0001      Self-Care   Self-Care  Other Self-Care Comments    Other Self-Care Comments   Exercise and activity progression, goals, FOTO      Exercises   Exercises  Knee/Hip      Knee/Hip Exercises: Stretches   Passive Hamstring Stretch  2 reps;30 seconds    Passive Hamstring Stretch Limitations  seated edge of chair    Hip Flexor Stretch  3 reps;30 seconds    Hip  Flexor Stretch Limitations  supine edge of mat    Gastroc Stretch  3 reps;30 seconds    Gastroc Stretch Limitations  standing at counter      Knee/Hip Exercises: Aerobic   Recumbent Bike  L2 x 5 min      Knee/Hip Exercises: Standing   Knee Flexion  10 reps    Knee Flexion Limitations  hamstring curl    Hip Flexion  10 reps    Hip Flexion Limitations  marching    Hip Abduction  10 reps    Hip Extension  10 reps      Knee/Hip Exercises: Seated   Sit to Sand  10 reps      Knee/Hip Exercises: Supine   Bridges  10 reps    Straight Leg Raises  10 reps             PT Education - 09/07/19 1220    Education Details  HEP    Person(s) Educated  Patient    Methods  Explanation    Comprehension  Verbalized understanding;Returned demonstration       PT Short Term Goals - 09/02/19 0947      PT SHORT TERM GOAL #1   Title  Patient will be I with initial HEP to progress in PT    Time  4    Period  Weeks    Status  Achieved    Target Date  08/13/19      PT SHORT TERM GOAL #2   Title  Patient will exhibit >/= 105 deg knee flexion to improve transfers    Time  4    Period  Weeks    Status  Achieved    Target Date  08/13/19      PT SHORT TERM GOAL #3   Title  Patient will be able to walk household distances without AD or gait deviation to improve ability to perform light household tasks    Time  4    Period  Weeks    Status  Achieved      PT SHORT TERM GOAL #4   Title  Patient will report knee pain with activity </= 3/10 to reduce compensation and improve functional ability    Time  4    Period  Weeks    Status  Achieved    Target Date  08/13/19        PT Long Term Goals - 09/07/19 1223      PT LONG TERM GOAL #1   Title  Patient will be I with final HEP to maintain progress from PT    Time  8    Period  Weeks    Status  Achieved      PT LONG TERM GOAL #2   Title  Patient will be able to walk 1 mile without AD and only a little bit of difficulty to improve  community access and shopping ability    Time  8    Period  Weeks    Status  Achieved      PT LONG TERM GOAL #3   Title  Patient will exhibit left knee strength 5/5 MMT to perform heavy household tasks and negotiate stairs with little to no difficulty    Time  8    Period  Weeks    Status  Achieved      PT LONG TERM GOAL #4   Title  Patient will exhibit improved knee AROM to >/= 120 deg flexion and 0 deg extension to normalize transfers and functional movement    Time  8    Period  Weeks    Status  Achieved            Plan - 09/07/19 1222    Clinical Impression Statement  Patient tolerated therapy well with no adverse effects. She has achieved all her goals and is independent with her HEP. She will be discharged from formal PT.    PT Next Visit Plan  NA - discharge    PT Home Exercise Plan  [id number]    Consulted and Agree with Plan of Care  Patient           Visit Diagnosis: Chronic pain of left knee  Muscle weakness (generalized)  Stiffness of left knee, not elsewhere classified  Localized edema  Other abnormalities of gait and mobility     Problem List Patient Active Problem List   Diagnosis Date Noted  . Arthritis of left knee 07/01/2019  . B12 deficiency 03/23/2019  . Anemia 02/05/2019  . Rheumatoid arthritis with rheumatoid factor of multiple sites without organ or systems involvement ([place]) 01/25/2019  . High risk medication use 01/25/2019  . Fatigue 09/18/2018  . Routine general medical examination at a health care facility 02/07/2015  . Morbid obesity ([place]) 04/19/2013  . Hyperlipidemia associated with type 2 diabetes mellitus ([place]) 07/28/2009  . Osteopenia 03/14/2009  . Diabetes mellitus type 2 with complications ([place]) 33/54/5625  . Hypothyroidism 09/24/2006  . Essential hypertension 09/24/2006    PHYSICAL THERAPY DISCHARGE SUMMARY  Visits from Start of Care: 13  Current functional level related to goals / functional outcomes: See above    Remaining deficits: See above   Education / Equipment: HEP Plan: Patient agrees to discharge.  Patient goals were met. Patient is being discharged due to meeting the stated rehab goals.  ?????     Hilda Blades, PT, DPT, LAT, ATC 09/07/19  12:55 PM Phone: [phone] Fax: [facility] [address] [place], [place], [address] Phone: [phone]   Fax:  [phone]  Name: Marissa Roberts MRN: [id number] Date of Birth: [birth year]

## 2019-09-07 NOTE — Patient Instructions (Signed)
Access Code: [email or web address] URL: [email or web address] Date: 09/07/2019 Prepared by: Hilda Blades  Exercises Modified Marcello Moores Stretch - 2 x daily - 7 x weekly - 3 reps - 20 seconds hold Seated Hamstring Stretch - 2 x daily - 7 x weekly - 3 reps - 20 seconds hold Standing Gastroc Stretch at Counter - 2 x daily - 7 x weekly - 3 reps - 20 seconds hold Seated Knee Flexion Stretch - 2 x daily - 7 x weekly - 10 reps - 10 seconds hold Hooklying Straight Leg Raise - 2 x daily - 7 x weekly - 10 reps Sit to Stand without Arm Support - 1 x daily - 7 x weekly - 2 sets - 10 reps Standing March with Counter Support - 1 x daily - 7 x weekly - 10 reps - 2 sets Standing Hip Abduction with Counter Support - 1 x daily - 7 x weekly - 10 reps - 2 sets Standing Hip Extension with Counter Support - 1 x daily - 7 x weekly - 10 reps - 2 sets Standing Knee Flexion with Counter Support - 1 x daily - 7 x weekly - 2 sets - 10 reps Heel rises with counter support - 1 x daily - 7 x weekly - 2 sets - 20 reps

## 2019-09-08 NOTE — Progress Notes (Signed)
Subjective: Marissa Roberts presents today preventative diabetic foot care and painful corn(s) b/l feet and painful mycotic toenails b/l that are difficult to trim. Pain interferes with ambulation. Aggravating factors include wearing enclosed shoe gear. Pain is relieved with periodic professional debridement.   She states she has had her left knee surgery and is recovering well.  [name], MD is patient's PCP. Last visit was: 03/23/2019.  Past Medical History:  Diagnosis Date   Arthritis    Bronchitis    hx of   Diabetes mellitus    Family history of adverse reaction to anesthesia    per patient, "daughter threw up after a knee surgery"   GERD (gastroesophageal reflux disease)    Hypercholesteremia    Hypertension    Hypothyroidism    Pneumonia    hx of   PONV (postoperative nausea and vomiting)    Thyroid disease      Current Outpatient Medications on File Prior to Visit  Medication Sig Dispense Refill   amLODipine (NORVASC) 5 MG tablet Take 1 tablet (5 mg total) by mouth daily. 90 tablet 3   BD PEN NEEDLE NANO U/F 32G X 4 MM MISC USE  4 TIMES DAILY TO  INJECT  INSULIN 200 each 3   Biotin 10000 MCG TABS Take 10,000 mcg by mouth daily.     calcium-vitamin D (OSCAL WITH D) 500-200 MG-UNIT tablet Take 2 tablets by mouth daily with breakfast.     FARXIGA 5 MG TABS tablet Take 1 tablet by mouth once daily 30 tablet 3   fluticasone (FLONASE) 50 MCG/ACT nasal spray Place 2 sprays into both nostrils 2 (two) times daily as needed for allergies or rhinitis.     folic acid (FOLVITE) 1 MG tablet Take 2 tablets by mouth once daily 180 tablet 0   furosemide (LASIX) 40 MG tablet Take 40 mg by mouth 2 (two) times daily.     gabapentin (NEURONTIN) 300 MG capsule Take 1 capsule (300 mg total) by mouth 3 (three) times daily. 90 capsule 0   Glucosamine HCl (GLUCOSAMINE PO) Take 1 capsule by mouth daily.      glucose blood (ONETOUCH VERIO) test strip Use Onetouch  verio test strips as instructed to check blood sugars 4 times daily. DX:E11.65 400 each 1   HYDROcodone-acetaminophen (NORCO/VICODIN) 5-325 MG tablet Take 1 tablet by mouth every 12 (twelve) hours as needed for moderate pain. 30 tablet 0   hydroxychloroquine (PLAQUENIL) 200 MG tablet Take 1 tablet by mouth twice daily 180 tablet 0   insulin degludec (TRESIBA FLEXTOUCH) 100 UNIT/ML FlexTouch Pen Inject 40 Units into the skin daily.     levothyroxine (SYNTHROID) 137 MCG tablet Take 137 mcg by mouth daily before breakfast.     linaclotide (LINZESS) 290 MCG CAPS capsule Take 290 mcg by mouth daily as needed.     metFORMIN (GLUCOPHAGE) 1000 MG tablet Take 1,000 mg by mouth 2 (two) times daily with a meal.     methocarbamol (ROBAXIN) 500 MG tablet Take 1 tablet (500 mg total) by mouth every 8 (eight) hours as needed for muscle spasms. 30 tablet 0   montelukast (SINGULAIR) 10 MG tablet Take 1 tablet (10 mg total) by mouth at bedtime. 90 tablet 3   Multiple Vitamin (MULTIVITAMIN WITH MINERALS) TABS tablet Take 1 tablet by mouth daily.     NOVOLOG FLEXPEN 100 UNIT/ML FlexPen Inject 15-22 units under the skin three times daily before meals. (Patient not taking: Reported on 08/27/2019) 10 pen 3  OneTouch Delica Lancets 51O MISC USE TO CHECK BLOOD SUGAR UP TO THREE TIMES A DAY 2 HOURS AFTER A MEAL AND UP TO 3 TIMES A WEEK UPON WAKING UP 300 each 0   potassium chloride (KLOR-CON) 10 MEQ tablet Take 1 tablet by mouth twice daily 180 tablet 0   rosuvastatin (CRESTOR) 40 MG tablet Take 1 tablet (40 mg total) by mouth daily. Annual appt due in June must see provider for future refills 90 tablet 0   valsartan-hydrochlorothiazide (DIOVAN-HCT) 320-12.5 MG tablet Take 1 tablet by mouth once daily 90 tablet 0   [DISCONTINUED] Saxagliptin-Metformin 07-998 MG TB24 Take 1 tablet by mouth daily. 30 tablet    No current facility-administered medications on file prior to visit.     Allergies  Allergen  Reactions   Contrast Media [Iodinated Diagnostic Agents]     Throat swells from ct con tra   Adhesive [Tape]     No paper tape, causes blisters   Erythromycin Rash   Penicillins Rash    Did it involve swelling of the face/tongue/throat, SOB, or low BP? No Did it involve sudden or severe rash/hives, skin peeling, or any reaction on the inside of your mouth or nose? No Did you need to seek medical attention at a hospital or doctor's office? No When did it last happen?Many years If all above answers are NO, may proceed with cephalosporin use.     Objective: [place] is a pleasant 74 y.o. y.o. Patient Race: Black or African American [2]  female in NAD. AAO x 3.  There were no vitals filed for this visit.  Vascular Examination: Neurovascular status unchanged b/l lower extremities. Capillary fill time to digits <3 seconds b/l lower extremities. Palpable DP pulses b/l. Palpable PT pulses b/l. Pedal hair sparse b/l. Skin temperature gradient within normal limits b/l.  Dermatological Examination: Pedal skin with normal turgor, texture and tone bilaterally. No open wounds bilaterally. No interdigital macerations bilaterally. Toenails 1-5 b/l elongated, discolored, dystrophic, thickened, crumbly with subungual debris and tenderness to dorsal palpation. Hyperkeratotic lesion(s) L 5th toe and R 5th toe.  No erythema, no edema, no drainage, no flocculence.  Musculoskeletal: Normal muscle strength 5/5 to all lower extremity muscle groups bilaterally. No pain crepitus or joint limitation noted with ROM b/l. Hammertoes noted to the L 5th toe and R 5th toe. Patient ambulates independent of any assistive aids.  Neurological Examination: Protective sensation intact 5/5 intact bilaterally with 10g monofilament b/l.  Assessment: 1. Pain due to onychomycosis of toenails of both feet   2. Corns   3. Type 2 diabetes mellitus without complication, with long-term current use of insulin  ([place])   Plan: -Examined patient. -No new findings. No new orders. -Toenails 1-5 b/l were debrided in length and girth with sterile nail nippers and dremel without iatrogenic bleeding.  -Corn(s) L 5th toe and R 5th toe pared utilizing sterile scalpel blade without complication or incident. Total number debrided=2. -Patient to continue soft, supportive shoe gear daily. -Patient to report any pedal injuries to medical professional immediately. -Patient/POA to call should there be question/concern in the interim.  Return in about 3 months (around 12/04/2019) for diabetic nail and callus trim.  Marissa Roberts, DPM

## 2019-09-09 ENCOUNTER — Encounter: Payer: Medicare Other | Admitting: Physical Therapy

## 2019-09-13 ENCOUNTER — Other Ambulatory Visit: Payer: Self-pay | Admitting: Endocrinology

## 2019-09-21 ENCOUNTER — Ambulatory Visit (INDEPENDENT_AMBULATORY_CARE_PROVIDER_SITE_OTHER): Payer: Medicare Other | Admitting: Internal Medicine

## 2019-09-21 ENCOUNTER — Encounter: Payer: Self-pay | Admitting: Internal Medicine

## 2019-09-21 ENCOUNTER — Other Ambulatory Visit: Payer: Self-pay

## 2019-09-21 VITALS — BP 132/86 | HR 72 | Temp 98.5°F | Ht 65.0 in | Wt 234.0 lb

## 2019-09-21 DIAGNOSIS — D500 Iron deficiency anemia secondary to blood loss (chronic): Secondary | ICD-10-CM | POA: Diagnosis not present

## 2019-09-21 DIAGNOSIS — E1169 Type 2 diabetes mellitus with other specified complication: Secondary | ICD-10-CM

## 2019-09-21 DIAGNOSIS — E785 Hyperlipidemia, unspecified: Secondary | ICD-10-CM

## 2019-09-21 DIAGNOSIS — E538 Deficiency of other specified B group vitamins: Secondary | ICD-10-CM

## 2019-09-21 DIAGNOSIS — I10 Essential (primary) hypertension: Secondary | ICD-10-CM

## 2019-09-21 DIAGNOSIS — E118 Type 2 diabetes mellitus with unspecified complications: Secondary | ICD-10-CM | POA: Diagnosis not present

## 2019-09-21 DIAGNOSIS — Z0000 Encounter for general adult medical examination without abnormal findings: Secondary | ICD-10-CM | POA: Diagnosis not present

## 2019-09-21 LAB — CBC
HCT: 31.6 % — ABNORMAL LOW (ref 36.0–46.0)
Hemoglobin: 10.2 g/dL — ABNORMAL LOW (ref 12.0–15.0)
MCHC: 32.3 g/dL (ref 30.0–36.0)
MCV: 80.4 fl (ref 78.0–100.0)
Platelets: 311 10*3/uL (ref 150.0–400.0)
RBC: 3.93 Mil/uL (ref 3.87–5.11)
RDW: 18.9 % — ABNORMAL HIGH (ref 11.5–15.5)
WBC: 9.3 10*3/uL (ref 4.0–10.5)

## 2019-09-21 LAB — VITAMIN B12: Vitamin B-12: 325 pg/mL (ref 211–911)

## 2019-09-21 LAB — FERRITIN: Ferritin: 11.1 ng/mL (ref 10.0–291.0)

## 2019-09-21 MED ORDER — LINACLOTIDE 290 MCG PO CAPS: 290.0000 ug | ORAL_CAPSULE | Freq: Every day | ORAL | 3 refills | Status: DC | PRN

## 2019-09-21 NOTE — Assessment & Plan Note (Signed)
Checking CBC and ferritin and B12 and adjust as needed.

## 2019-09-21 NOTE — Patient Instructions (Signed)
We are checking the labs today. You are due for a colonoscopy this year.    Health Maintenance, Female Adopting a healthy lifestyle and getting preventive care are important in promoting health and wellness. Ask your health care provider about:  The right schedule for you to have regular tests and exams.  Things you can do on your own to prevent diseases and keep yourself healthy. What should I know about diet, weight, and exercise? Eat a healthy diet   Eat a diet that includes plenty of vegetables, fruits, low-fat dairy products, and lean protein.  Do not eat a lot of foods that are high in solid fats, added sugars, or sodium. Maintain a healthy weight Body mass index (BMI) is used to identify weight problems. It estimates body fat based on height and weight. Your health care provider can help determine your BMI and help you achieve or maintain a healthy weight. Get regular exercise Get regular exercise. This is one of the most important things you can do for your health. Most adults should:  Exercise for at least 150 minutes each week. The exercise should increase your heart rate and make you sweat (moderate-intensity exercise).  Do strengthening exercises at least twice a week. This is in addition to the moderate-intensity exercise.  Spend less time sitting. Even light physical activity can be beneficial. Watch cholesterol and blood lipids Have your blood tested for lipids and cholesterol at 72 years of age, then have this test every 5 years. Have your cholesterol levels checked more often if:  Your lipid or cholesterol levels are high.  You are older than 72 years of age.  You are at high risk for heart disease. What should I know about cancer screening? Depending on your health history and family history, you may need to have cancer screening at various ages. This may include screening for:  Breast cancer.  Cervical cancer.  Colorectal cancer.  Skin cancer.  Lung  cancer. What should I know about heart disease, diabetes, and high blood pressure? Blood pressure and heart disease  High blood pressure causes heart disease and increases the risk of stroke. This is more likely to develop in people who have high blood pressure readings, are of African descent, or are overweight.  Have your blood pressure checked: ? Every 3-5 years if you are 37-32 years of age. ? Every year if you are 108 years old or older. Diabetes Have regular diabetes screenings. This checks your fasting blood sugar level. Have the screening done:  Once every three years after age 85 if you are at a normal weight and have a low risk for diabetes.  More often and at a younger age if you are overweight or have a high risk for diabetes. What should I know about preventing infection? Hepatitis B If you have a higher risk for hepatitis B, you should be screened for this virus. Talk with your health care provider to find out if you are at risk for hepatitis B infection. Hepatitis C Testing is recommended for:  Everyone born from [birth year] through 1963.  Anyone with known risk factors for hepatitis C. Sexually transmitted infections (STIs)  Get screened for STIs, including gonorrhea and chlamydia, if: ? You are sexually active and are younger than 72 years of age. ? You are older than 72 years of age and your health care provider tells you that you are at risk for this type of infection. ? Your sexual activity has changed since you were last  screened, and you are at increased risk for chlamydia or gonorrhea. Ask your health care provider if you are at risk.  Ask your health care provider about whether you are at high risk for HIV. Your health care provider may recommend a prescription medicine to help prevent HIV infection. If you choose to take medicine to prevent HIV, you should first get tested for HIV. You should then be tested every 3 months for as long as you are taking the  medicine. Pregnancy  If you are about to stop having your period (premenopausal) and you may become pregnant, seek counseling before you get pregnant.  Take 400 to 800 micrograms (mcg) of folic acid every day if you become pregnant.  Ask for birth control (contraception) if you want to prevent pregnancy. Osteoporosis and menopause Osteoporosis is a disease in which the bones lose minerals and strength with aging. This can result in bone fractures. If you are 74 years old or older, or if you are at risk for osteoporosis and fractures, ask your health care provider if you should:  Be screened for bone loss.  Take a calcium or vitamin D supplement to lower your risk of fractures.  Be given hormone replacement therapy (HRT) to treat symptoms of menopause. Follow these instructions at home: Lifestyle  Do not use any products that contain nicotine or tobacco, such as cigarettes, e-cigarettes, and chewing tobacco. If you need help quitting, ask your health care provider.  Do not use street drugs.  Do not share needles.  Ask your health care provider for help if you need support or information about quitting drugs. Alcohol use  Do not drink alcohol if: ? Your health care provider tells you not to drink. ? You are pregnant, may be pregnant, or are planning to become pregnant.  If you drink alcohol: ? Limit how much you use to 0-1 drink a day. ? Limit intake if you are breastfeeding.  Be aware of how much alcohol is in your drink. In the [place] one drink equals one 12 oz bottle of beer (355 mL), one 5 oz glass of wine (148 mL), or one 1 oz glass of hard liquor (44 mL). General instructions  Schedule regular health, dental, and eye exams.  Stay current with your vaccines.  Tell your health care provider if: ? You often feel depressed. ? You have ever been abused or do not feel safe at home. Summary  Adopting a healthy lifestyle and getting preventive care are important in  promoting health and wellness.  Follow your health care provider's instructions about healthy diet, exercising, and getting tested or screened for diseases.  Follow your health care provider's instructions on monitoring your cholesterol and blood pressure. This information is not intended to replace advice given to you by your health care provider. Make sure you discuss any questions you have with your health care provider. Document Revised: 03/11/2018 Document Reviewed: 03/11/2018 Elsevier Patient Education  2020 [employer].

## 2019-09-21 NOTE — Assessment & Plan Note (Signed)
Foot exam done today. Most recent HgA1c consistent with over treatment and endo is adjusting her regimen.

## 2019-09-21 NOTE — Assessment & Plan Note (Signed)
Taking crestor 40 mg daily and recent lipid panel at goal.

## 2019-09-21 NOTE — Assessment & Plan Note (Signed)
Checking B12 deficiency.

## 2019-09-21 NOTE — Assessment & Plan Note (Signed)
Flu shot due yearly. Covid-19 complete. Pneumonia complete. Shingrix counseled. Tetanus due 2025. Colonoscopy due reminded to schedule. Mammogram due reminded to schedule, pap smear aged out and dexa due 2023. Counseled about sun safety and mole surveillance. Counseled about the dangers of distracted driving. Given 10 year screening recommendations.

## 2019-09-21 NOTE — Assessment & Plan Note (Signed)
BP at goal on amlodipine and lasix and valsartan/hctz. Recent CMP without indication for change.

## 2019-09-21 NOTE — Progress Notes (Signed)
Subjective:   Patient ID: Marissa Roberts, female    DOB: [birth year], 74 y.o.   MRN: [id number]  HPI Here for medicare wellness and physical, no new complaints. Please see A/P for status and treatment of chronic medical problems.   Diet: DM since diabetic Physical activity: sedentary, recovering from knee replacement Depression/mood screen: negative Hearing: intact to whispered voice, mild loss bilaterally Visual acuity: grossly normal with lens, performs annual eye exam  ADLs: capable Fall risk: low Home safety: good Cognitive evaluation: intact to orientation, naming, recall and repetition EOL planning: adv directives discussed    Office Visit from 09/21/2019 in [place] at [facility] Total Score 0        Clinical Support from 09/16/2017 in [place]  PHQ-9 Total Score 2     I have personally reviewed and have noted 1. The patient's medical and social history - reviewed today no changes 2. Their use of alcohol, tobacco or illicit drugs 3. Their current medications and supplements 4. The patient's functional ability including ADL's, fall risks, home safety risks and hearing or visual impairment. 5. Diet and physical activities 6. Evidence for depression or mood disorders 7. Care team reviewed and updated  Patient Care Team: [name], MD as PCP - General (Internal Medicine) Elayne Snare, MD as Consulting Physician (Endocrinology) Past Medical History:  Diagnosis Date  . Arthritis   . Bronchitis    hx of  . Diabetes mellitus   . Family history of adverse reaction to anesthesia    per patient, "daughter threw up after a knee surgery"  . GERD (gastroesophageal reflux disease)   . Hypercholesteremia   . Hypertension   . Hypothyroidism   . Pneumonia    hx of  . PONV (postoperative nausea and vomiting)   . Thyroid disease    Past Surgical History:  Procedure Laterality Date  . ABDOMINAL HYSTERECTOMY      partial  . BREAST SURGERY     lumpectomy left breast  . carpel tunnel    . COLONOSCOPY W/ POLYPECTOMY    . FOOT SURGERY    . knee arhtroscopy    . KNEE ARTHROPLASTY    . SHOULDER ARTHROSCOPY WITH SUBACROMIAL DECOMPRESSION, ROTATOR CUFF REPAIR AND BICEP TENDON REPAIR Right 05/26/2012   Procedure: Right Shoulder Diagnostic Operative Arthroscopy, Subacromial Decompression, Biceps Tenodesis, Mini Open Rotator Cuff Repair;  Surgeon: [name], MD;  Location: [facility];  Service: Orthopedics;  Laterality: Right;  Right Shoulder Diagnostic Operative Arthroscopy, Subacromial Decompression, Biceps Tenodesis, Mini Open Rotator Cuff Repair  . TONSILLECTOMY    . TOTAL KNEE ARTHROPLASTY Left 07/01/2019   Procedure: LEFT TOTAL KNEE ARTHROPLASTY;  Surgeon: [name], MD;  Location: [facility];  Service: Orthopedics;  Laterality: Left;   Family History  Problem Relation Age of Onset  . Diabetes Mother   . Heart disease Mother   . Hyperlipidemia Mother   . Diabetes Father   . Diabetes Sister   . Hypertension Sister   . Heart disease Sister   . Dementia Sister   . Heart disease Brother   . Healthy Daughter   . Healthy Daughter   . Healthy Daughter   . Healthy Son    Review of Systems  Constitutional: Negative.   HENT: Negative.   Eyes: Negative.   Respiratory: Negative for cough, chest tightness and shortness of breath.   Cardiovascular: Negative for chest pain, palpitations and leg swelling.  Gastrointestinal: Negative for abdominal  distention, abdominal pain, constipation, diarrhea, nausea and vomiting.  Musculoskeletal: Negative.   Skin: Negative.   Neurological: Negative.   Psychiatric/Behavioral: Negative.     Objective:  Physical Exam Constitutional:      Appearance: She is well-developed. She is obese.  HENT:     Head: Normocephalic and atraumatic.  Cardiovascular:     Rate and Rhythm: Normal rate and regular rhythm.  Pulmonary:     Effort: Pulmonary effort is normal.  No respiratory distress.     Breath sounds: Normal breath sounds. No wheezing or rales.  Abdominal:     General: Bowel sounds are normal. There is no distension.     Palpations: Abdomen is soft.     Tenderness: There is no abdominal tenderness. There is no rebound.  Musculoskeletal:     Cervical back: Normal range of motion.  Skin:    General: Skin is warm and dry.  Neurological:     Mental Status: She is alert and oriented to person, place, and time.     Coordination: Coordination normal.     Vitals:   09/21/19 0850  BP: 132/86  Pulse: 72  Temp: 98.5 F (36.9 C)  TempSrc: Oral  SpO2: 98%  Weight: 234 lb (106.1 kg)  Height: 5\' 5"  (1.651 m)   This visit occurred during the SARS-CoV-2 public health emergency.  Safety protocols were in place, including screening questions prior to the visit, additional usage of staff PPE, and extensive cleaning of exam room while observing appropriate contact time as indicated for disinfecting solutions.   Assessment & Plan:

## 2019-09-22 LAB — IRON, TOTAL/TOTAL IRON BINDING CAP
%SAT: 12 % (calc) — ABNORMAL LOW (ref 16–45)
Iron: 38 ug/dL — ABNORMAL LOW (ref 45–160)
TIBC: 322 mcg/dL (calc) (ref 250–450)

## 2019-09-23 ENCOUNTER — Encounter: Payer: Self-pay | Admitting: Orthopedic Surgery

## 2019-09-23 ENCOUNTER — Ambulatory Visit (INDEPENDENT_AMBULATORY_CARE_PROVIDER_SITE_OTHER): Payer: Medicare Other | Admitting: Orthopedic Surgery

## 2019-09-23 DIAGNOSIS — Z96652 Presence of left artificial knee joint: Secondary | ICD-10-CM

## 2019-09-23 NOTE — Progress Notes (Signed)
Post-Op Visit Note   Patient: Marissa Roberts           Date of Birth: [birth year]           MRN: [id number] Visit Date: 09/23/2019 PCP: [name], MD   Assessment & Plan:  Chief Complaint:  Chief Complaint  Patient presents with  . Left Knee - Routine Post Op   Visit Diagnoses:  1. Status post total left knee replacement     Plan: [name] is a 74 year old patient who is now about 2-1/2 months out left total knee replacement.  In general she has been doing reasonably well.  Occasionally has some swelling and soreness in that left knee region.  She finished physical therapy.  On exam she has good range of motion from nearly full extension to about 105 of flexion.  Collaterals are stable.  Extensor mechanism is intact.  Plan at this time is to continue with a home exercise program.  Follow-up with [place] as needed.  Follow-Up Instructions: Return if symptoms worsen or fail to improve.   Orders:  No orders of the defined types were placed in this encounter.  No orders of the defined types were placed in this encounter.   Imaging: No results found.  PMFS History: Patient Active Problem List   Diagnosis Date Noted  . Arthritis of left knee 07/01/2019  . B12 deficiency 03/23/2019  . Anemia 02/05/2019  . 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 High risk medication use 01/25/2019  . Fatigue 09/18/2018  . Routine general medical examination at a health care facility 02/07/2015  . Morbid obesity ([place]) 04/19/2013  . Hyperlipidemia associated with type 2 diabetes mellitus ([place]) 07/28/2009  . Osteopenia 03/14/2009  . Diabetes mellitus type 2 with complications ([place]) 22/33/6122  . Hypothyroidism 09/24/2006  . Essential hypertension 09/24/2006   Past Medical History:  Diagnosis Date  . Arthritis   . Bronchitis    hx of  . Diabetes mellitus   . Family history of adverse reaction to  anesthesia    per patient, "daughter threw up after a knee surgery"  . GERD (gastroesophageal reflux disease)   . Hypercholesteremia   . Hypertension   . Hypothyroidism   . Pneumonia    hx of  . PONV (postoperative nausea and vomiting)   . Thyroid disease     Family History  Problem Relation Age of Onset  . Diabetes Mother   . Heart disease Mother   . Hyperlipidemia Mother   . Diabetes Father   . Diabetes Sister   . Hypertension Sister   . Heart disease Sister   . Dementia Sister   . Heart disease Brother   . Healthy Daughter   . Healthy Daughter   . Healthy Daughter   . Healthy Son     Past Surgical History:  Procedure Laterality Date  . ABDOMINAL HYSTERECTOMY     partial  . BREAST SURGERY     lumpectomy left breast  . carpel tunnel    . COLONOSCOPY W/ POLYPECTOMY    . FOOT SURGERY    . knee arhtroscopy    . KNEE ARTHROPLASTY    . SHOULDER ARTHROSCOPY WITH SUBACROMIAL DECOMPRESSION, ROTATOR CUFF REPAIR AND BICEP TENDON REPAIR Right 05/26/2012   Procedure: Right Shoulder Diagnostic Operative Arthroscopy, Subacromial Decompression, Biceps Tenodesis, Mini Open Rotator Cuff Repair;  Surgeon: [name], MD;  Location: [facility];  Service: Orthopedics;  Laterality: Right;  Right Shoulder Diagnostic Operative Arthroscopy, Subacromial Decompression, Biceps Tenodesis, Mini Open Rotator Cuff Repair  . TONSILLECTOMY    . TOTAL KNEE ARTHROPLASTY Left 07/01/2019   Procedure: LEFT TOTAL KNEE ARTHROPLASTY;  Surgeon: [name], MD;  Location: [facility];  Service: Orthopedics;  Laterality: Left;   Social History   Occupational History  . Not on file  Tobacco Use  . Smoking status: Former Smoker    Quit date: 04/01/1990    Years since quitting: 29.4  . Smokeless tobacco: Never Used  Vaping Use  . Vaping Use: Never used  Substance and Sexual Activity  . Alcohol use: Not Currently    Comment: 06/22/2019: per patient about 20-25 years ago  . Drug use: No  . Sexual  activity: Never    Birth control/protection: Abstinence

## 2019-09-29 ENCOUNTER — Other Ambulatory Visit: Payer: Self-pay | Admitting: Internal Medicine

## 2019-09-29 DIAGNOSIS — D500 Iron deficiency anemia secondary to blood loss (chronic): Secondary | ICD-10-CM

## 2019-10-01 ENCOUNTER — Other Ambulatory Visit: Payer: Self-pay | Admitting: Internal Medicine

## 2019-10-01 ENCOUNTER — Other Ambulatory Visit: Payer: Self-pay | Admitting: Endocrinology

## 2019-10-05 ENCOUNTER — Other Ambulatory Visit: Payer: Self-pay | Admitting: Endocrinology

## 2019-10-06 ENCOUNTER — Telehealth: Payer: Self-pay | Admitting: Endocrinology

## 2019-10-06 ENCOUNTER — Ambulatory Visit (HOSPITAL_COMMUNITY)
Admission: RE | Admit: 2019-10-06 | Discharge: 2019-10-06 | Disposition: A | Discharge status: Home / Self Care | Payer: Medicare Other | Source: Ambulatory Visit | Attending: Internal Medicine | Admitting: Internal Medicine

## 2019-10-06 ENCOUNTER — Other Ambulatory Visit: Payer: Self-pay

## 2019-10-06 DIAGNOSIS — D500 Iron deficiency anemia secondary to blood loss (chronic): Secondary | ICD-10-CM | POA: Diagnosis not present

## 2019-10-06 MED ORDER — SODIUM CHLORIDE 0.9 % IV SOLN
1000.0000 mg | Freq: Once | INTRAVENOUS | Status: AC
  Administered 2019-10-06: 1000 mg via INTRAVENOUS
  Filled 2019-10-06: qty 20

## 2019-10-06 MED ORDER — SODIUM CHLORIDE 0.9 % IV SOLN
25.0000 mg | Freq: Once | INTRAVENOUS | Status: AC
  Administered 2019-10-06: 25 mg via INTRAVENOUS
  Filled 2019-10-06: qty 0.5

## 2019-10-06 MED ORDER — SODIUM CHLORIDE 0.9 % IV SOLN
INTRAVENOUS | Status: DC | PRN
  Administered 2019-10-06: 250 mL via INTRAVENOUS

## 2019-10-06 NOTE — Telephone Encounter (Signed)
Patient called stating her pharmacy contacted her to let her know she needed to call our office because her Rx for valsartan-hydrochlorothiazide did not go through. Patient ph# [place], [place]  [address] [place] [address]  Phone:  [phone] Fax:  [phone]

## 2019-10-06 NOTE — Discharge Instructions (Signed)
Iron Dextran injection What is this medicine? IRON DEXTRAN (AHY ern DEX tran) is an iron complex. Iron is used to make healthy red blood cells, which carry oxygen and nutrients through the body. This medicine is used to treat people who cannot take iron by mouth and have low levels of iron in the blood. This medicine may be used for other purposes; ask your health care provider or pharmacist if you have questions. COMMON BRAND NAME(S): Dexferrum, INFeD What should I tell my health care provider before I take this medicine? They need to know if you have any of these conditions:  anemia not caused by low iron levels  heart disease  high levels of iron in the blood  kidney disease  liver disease  an unusual or allergic reaction to iron, other medicines, foods, dyes, or preservatives  pregnant or trying to get pregnant  breast-feeding How should I use this medicine? This medicine is for injection into a vein or a muscle. It is given by a health care professional in a hospital or clinic setting. Talk to your pediatrician regarding the use of this medicine in children. While this drug may be prescribed for children as young as 4 months old for selected conditions, precautions do apply. Overdosage: If you think you have taken too much of this medicine contact a poison control center or emergency room at once. NOTE: This medicine is only for you. Do not share this medicine with others. What if I miss a dose? It is important not to miss your dose. Call your doctor or health care professional if you are unable to keep an appointment. What may interact with this medicine? Do not take this medicine with any of the following medications:  deferoxamine  dimercaprol  other iron products This medicine may also interact with the following medications:  chloramphenicol  deferasirox This list may not describe all possible interactions. Give your health care provider a list of all the  medicines, herbs, non-prescription drugs, or dietary supplements you use. Also tell them if you smoke, drink alcohol, or use illegal drugs. Some items may interact with your medicine. What should I watch for while using this medicine? Visit your doctor or health care professional regularly. Tell your doctor if your symptoms do not start to get better or if they get worse. You may need blood work done while you are taking this medicine. You may need to follow a special diet. Talk to your doctor. Foods that contain iron include: whole grains/cereals, dried fruits, beans, or peas, leafy green vegetables, and organ meats (liver, kidney). Long-term use of this medicine may increase your risk of some cancers. Talk to your doctor about how to limit your risk. What side effects may I notice from receiving this medicine? Side effects that you should report to your doctor or health care professional as soon as possible:  allergic reactions like skin rash, itching or hives, swelling of the face, lips, or tongue  blue lips, nails, or skin  breathing problems  changes in blood pressure  chest pain  confusion  fast, irregular heartbeat  feeling faint or lightheaded, falls  fever or chills  flushing, sweating, or hot feelings  joint or muscle aches or pains  pain, tingling, numbness in the hands or feet  seizures  unusually weak or tired Side effects that usually do not require medical attention (report to your doctor or health care professional if they continue or are bothersome):  change in taste (metallic taste)    diarrhea  headache  irritation at site where injected  nausea, vomiting  stomach upset This list may not describe all possible side effects. Call your doctor for medical advice about side effects. You may report side effects to FDA at 1-800-FDA-1088. Where should I keep my medicine? This drug is given in a hospital or clinic and will not be stored at home. NOTE: This  sheet is a summary. It may not cover all possible information. If you have questions about this medicine, talk to your doctor, pharmacist, or health care provider.  2020 Elsevier/Gold Standard (2007-08-04 16:59:50)  

## 2019-10-06 NOTE — Progress Notes (Signed)
PATIENT CARE CENTER NOTE  Diagnosis: Iron deficiency anemia due to chronic blood loss (D50.0)   Provider: Pricilla Holm, MD   Procedure: Infed (Iron Dextran Complex)   Note: Patient received Infed infusion via PIV. Test-dose given and patient tolerated well with no adverse reaction. Patient observed for 1 hour post test-dose. Full dose given. Vital signs remained stable throughout infusion. Discharge instructions given. Patient alert, oriented and ambulatory at discharge.

## 2019-10-07 ENCOUNTER — Telehealth: Payer: Self-pay | Admitting: Orthopedic Surgery

## 2019-10-07 ENCOUNTER — Other Ambulatory Visit: Payer: Self-pay

## 2019-10-07 ENCOUNTER — Other Ambulatory Visit: Payer: Self-pay | Admitting: Surgical

## 2019-10-07 MED ORDER — CEPHALEXIN 500 MG PO CAPS: ORAL_CAPSULE | ORAL | 0 refills | Status: DC

## 2019-10-07 MED ORDER — VALSARTAN-HYDROCHLOROTHIAZIDE 320-12.5 MG PO TABS: 1.0000 | ORAL_TABLET | Freq: Every day | ORAL | 0 refills | Status: DC

## 2019-10-07 NOTE — Telephone Encounter (Signed)
Called pharmacy to clarify what the pt was meaning. Pharmacy did not know, but after looking further into the recent Rx sent, it appears that the Rx did not transmit successfully to the pharmacy. This Rx was sent again and receipt was confirmed by pharmacy.

## 2019-10-07 NOTE — Telephone Encounter (Signed)
Pt advised.

## 2019-10-07 NOTE — Telephone Encounter (Signed)
Pls advise.  

## 2019-10-07 NOTE — Telephone Encounter (Signed)
Patient called.   She is requesting an antibiotic be called in to her pharmacy in preparation for her dental appointment at [facility].   Call back: [phone]

## 2019-10-07 NOTE — Telephone Encounter (Signed)
Submitted rx for cephalexin

## 2019-10-08 NOTE — Telephone Encounter (Signed)
Pharmacy does have this Rx.

## 2019-10-11 ENCOUNTER — Ambulatory Visit (INDEPENDENT_AMBULATORY_CARE_PROVIDER_SITE_OTHER): Payer: Medicare Other | Admitting: Internal Medicine

## 2019-10-11 ENCOUNTER — Telehealth: Payer: Self-pay | Admitting: Internal Medicine

## 2019-10-11 ENCOUNTER — Encounter: Payer: Self-pay | Admitting: Internal Medicine

## 2019-10-11 ENCOUNTER — Other Ambulatory Visit: Payer: Self-pay

## 2019-10-11 VITALS — BP 140/84 | HR 76 | Temp 98.6°F | Ht 65.0 in | Wt 232.0 lb

## 2019-10-11 DIAGNOSIS — R21 Rash and other nonspecific skin eruption: Secondary | ICD-10-CM | POA: Insufficient documentation

## 2019-10-11 MED ORDER — TRIAMCINOLONE ACETONIDE 0.1 % EX CREA
1.0000 | TOPICAL_CREAM | Freq: Three times a day (TID) | CUTANEOUS | 0 refills | Status: DC
Start: 2019-10-11 — End: 2020-02-21

## 2019-10-11 MED ORDER — METHYLPREDNISOLONE ACETATE 40 MG/ML IJ SUSP
40.0000 mg | Freq: Once | INTRAMUSCULAR | Status: AC
  Administered 2019-10-11: 40 mg via INTRAMUSCULAR

## 2019-10-11 NOTE — Patient Instructions (Signed)
We have given you the steroid shot today to help clear the rash.  We have sent in a cream called triamcinolone to use 3 times a day for the rash area.

## 2019-10-11 NOTE — Telephone Encounter (Signed)
[facility] Report/Call : ---Caller states she received an iron infusion on 10/06/2019. Today she is getting a rash on her left ankle and is spreading up her leg. States it was itching yesterday but not this morning. No pain. The paperwork said if she gets a rash after the infusion to call the doctor.  Advised: Home care given.

## 2019-10-11 NOTE — Progress Notes (Signed)
° °  Subjective:   Patient ID: Marissa Roberts, female    DOB: 1945/08/14, 74 y.o.   MRN: [id number]  HPI The patient is a 74 YO female coming in for concerns about rash following iron infusion. Had iron infusion on 10/06/19 and the rash started the following day on her foot. It has since spread slightly above the ankle. Recently given rx for keflex prior to dental visit upcoming but she has not taken this yet. It is itching and she has used cortisone cream and oral benadryl which has not helped a lot. Denies fevers or chills. Denies other new medications or exposures. Overall worsening.  Review of Systems  Constitutional: Negative.   HENT: Negative.   Eyes: Negative.   Respiratory: Negative for cough, chest tightness and shortness of breath.   Cardiovascular: Negative for chest pain, palpitations and leg swelling.  Gastrointestinal: Negative for abdominal distention, abdominal pain, constipation, diarrhea, nausea and vomiting.  Musculoskeletal: Negative.   Skin: Positive for rash.  Neurological: Negative.   Psychiatric/Behavioral: Negative.     Objective:  Physical Exam Constitutional:      Appearance: She is well-developed.  HENT:     Head: Normocephalic and atraumatic.  Cardiovascular:     Rate and Rhythm: Normal rate and regular rhythm.  Pulmonary:     Effort: Pulmonary effort is normal. No respiratory distress.     Breath sounds: Normal breath sounds. No wheezing or rales.  Abdominal:     General: Bowel sounds are normal. There is no distension.     Palpations: Abdomen is soft.     Tenderness: There is no abdominal tenderness. There is no rebound.  Musculoskeletal:     Cervical back: Normal range of motion.  Skin:    General: Skin is warm and dry.     Comments: Rash on the left ankle to mid shin circumferential.   Neurological:     Mental Status: She is alert and oriented to person, place, and time.     Coordination: Coordination normal.     Vitals:   10/11/19 1415    BP: 140/84  Pulse: 76  Temp: 98.6 F (37 C)  TempSrc: Oral  SpO2: 97%  Weight: 232 lb (105.2 kg)  Height: 5\' 5"  (1.651 m)    This visit occurred during the SARS-CoV-2 public health emergency.  Safety protocols were in place, including screening questions prior to the visit, additional usage of staff PPE, and extensive cleaning of exam room while observing appropriate contact time as indicated for disinfecting solutions.   Assessment & Plan:  Depo-medrol 40 mg IM

## 2019-10-11 NOTE — Assessment & Plan Note (Signed)
Depo-medrol 40 mg IM given at visit. Rx triamcinolone ointment and this is likely mild reaction to iron infusion.

## 2019-10-11 NOTE — Telephone Encounter (Signed)
Pt has already made an OV to be seen this afternoon at 2.20pm.

## 2019-10-11 NOTE — Telephone Encounter (Signed)
Can use cortisone cream on the rash and benadryl for itching if needed. Can have office visit if desired.

## 2019-10-18 ENCOUNTER — Telehealth: Payer: Self-pay

## 2019-10-18 NOTE — Telephone Encounter (Signed)
Patient would like to know if there is something she can do for the pain she is having under her knee cap until her appointment on 10/28/2019 with Dr. Marlou Sa.  CB# [phone].  Please advise.  Thank you.

## 2019-10-18 NOTE — Telephone Encounter (Signed)
IC s/w patient. Worked her in Friday at 945

## 2019-10-22 ENCOUNTER — Ambulatory Visit (INDEPENDENT_AMBULATORY_CARE_PROVIDER_SITE_OTHER): Payer: Medicare Other | Admitting: Orthopedic Surgery

## 2019-10-22 DIAGNOSIS — Z96652 Presence of left artificial knee joint: Secondary | ICD-10-CM

## 2019-10-23 ENCOUNTER — Encounter: Payer: Self-pay | Admitting: Orthopedic Surgery

## 2019-10-23 NOTE — Progress Notes (Signed)
Office Visit Note   Patient: Marissa Roberts           Date of Birth: Feb 16, 1946           MRN: [id number] Visit Date: 10/22/2019 Requested by: [name], MD [address] [place],  [place] [address] PCP: [name], MD  Subjective: Chief Complaint  Patient presents with  . Left Knee - Pain    HPI: Marissa Roberts is a 74 y.o. female who presents to the office s/p left total knee arthroplasty on 07/01/2019.  Patient complains of pain over the last 5 days.  She states that pain has improved since the onset but she has been taking anti-inflammatories.  She localizes pain to the lateral aspect of the left knee.  She denies any fevers, chills, night sweats.  No recent injury.  She has been taking Aleve which is helped with her pain.  Denies any loss of range of motion.  Denies any pain of range of motion of the knee.  She is able to walk without any significant limp..                ROS: All systems reviewed are negative as they relate to the chief complaint within the history of present illness.  Patient denies fevers or chills.  Assessment & Plan: Visit Diagnoses: No diagnosis found.  Plan: Patient is a 74 year old female presents complaining of left knee pain.  She is almost 4 months out from left total knee arthroplasty.  She has been doing well but she has had 5 days of increased pain.  Pain has been improving over the last couple days.  She has been taking Aleve.  She notes lateral pain.  She states that she "wants to make sure it is not infected".  She has no constitutional symptoms and no pain with passive range of motion.  No effusion.  Incision intact..  No concern for infection at this time but encourage patient to follow-up if pain persists.  Patient understands and agrees with plan.  Letter provided for patient's dentist regarding antibiotic prophylaxis prior to dental procedures.  Follow-up as needed.  Follow-Up Instructions: No follow-ups on file.    Orders:  No orders of the defined types were placed in this encounter.  No orders of the defined types were placed in this encounter.     Procedures: No procedures performed   Clinical Data: No additional findings.  Objective: Vital Signs: There were no vitals taken for this visit.  Physical Exam:  Constitutional: Patient appears well-developed HEENT:  Head: Normocephalic Eyes:EOM are normal Neck: Normal range of motion Cardiovascular: Normal rate Pulmonary/chest: Effort normal Neurologic: Patient is alert Skin: Skin is warm Psychiatric: Patient has normal mood and affect  Ortho Exam: Orthopedic exam demonstrates left knee with well-healed scar from incision.  No evidence of dehiscence.  No erythema overlying the incision.  No expressible drainage.  No calf tenderness.  Negative Homans' sign.  Surgeries extension.  110 degrees flexion.  Mild tenderness palpation over the lateral joint line.  No pain with passive range of motion of the left knee.  Specialty Comments:  No specialty comments available.  Imaging: No results found.   PMFS History: Patient Active Problem List   Diagnosis Date Noted  . Rash 10/11/2019  . Arthritis of left knee 07/01/2019  . B12 deficiency 03/23/2019  . Anemia 02/05/2019  . Rheumatoid arthritis with rheumatoid factor of multiple sites without organ or systems involvement ([place]) 01/25/2019  .  High risk medication use 01/25/2019  . Fatigue 09/18/2018  . Routine general medical examination at a health care facility 02/07/2015  . Morbid obesity ([place]) 04/19/2013  . Hyperlipidemia associated with type 2 diabetes mellitus ([place]) 07/28/2009  . Osteopenia 03/14/2009  . Diabetes mellitus type 2 with complications ([place]) 01/77/9390  . Hypothyroidism 09/24/2006  . Essential hypertension 09/24/2006   Past Medical History:  Diagnosis Date  . Arthritis   . Bronchitis    hx of  . Diabetes mellitus   . Family history of adverse reaction to  anesthesia    per patient, "daughter threw up after a knee surgery"  . GERD (gastroesophageal reflux disease)   . Hypercholesteremia   . Hypertension   . Hypothyroidism   . Pneumonia    hx of  . PONV (postoperative nausea and vomiting)   . Thyroid disease     Family History  Problem Relation Age of Onset  . Diabetes Mother   . Heart disease Mother   . Hyperlipidemia Mother   . Diabetes Father   . Diabetes Sister   . Hypertension Sister   . Heart disease Sister   . Dementia Sister   . Heart disease Brother   . Healthy Daughter   . Healthy Daughter   . Healthy Daughter   . Healthy Son     Past Surgical History:  Procedure Laterality Date  . ABDOMINAL HYSTERECTOMY     partial  . BREAST SURGERY     lumpectomy left breast  . carpel tunnel    . COLONOSCOPY W/ POLYPECTOMY    . FOOT SURGERY    . knee arhtroscopy    . KNEE ARTHROPLASTY    . SHOULDER ARTHROSCOPY WITH SUBACROMIAL DECOMPRESSION, ROTATOR CUFF REPAIR AND BICEP TENDON REPAIR Right 05/26/2012   Procedure: Right Shoulder Diagnostic Operative Arthroscopy, Subacromial Decompression, Biceps Tenodesis, Mini Open Rotator Cuff Repair;  Surgeon: [name], MD;  Location: [facility];  Service: Orthopedics;  Laterality: Right;  Right Shoulder Diagnostic Operative Arthroscopy, Subacromial Decompression, Biceps Tenodesis, Mini Open Rotator Cuff Repair  . TONSILLECTOMY    . TOTAL KNEE ARTHROPLASTY Left 07/01/2019   Procedure: LEFT TOTAL KNEE ARTHROPLASTY;  Surgeon: [name], MD;  Location: [facility];  Service: Orthopedics;  Laterality: Left;   Social History   Occupational History  . Not on file  Tobacco Use  . Smoking status: Former Smoker    Quit date: 04/01/1990    Years since quitting: 29.5  . Smokeless tobacco: Never Used  Vaping Use  . Vaping Use: Never used  Substance and Sexual Activity  . Alcohol use: Not Currently    Comment: 06/22/2019: per patient about 20-25 years ago  . Drug use: No  . Sexual  activity: Never    Birth control/protection: Abstinence

## 2019-10-25 ENCOUNTER — Other Ambulatory Visit: Payer: Self-pay | Admitting: Internal Medicine

## 2019-10-25 ENCOUNTER — Other Ambulatory Visit: Payer: Self-pay | Admitting: Physician Assistant

## 2019-10-25 DIAGNOSIS — M0579 Rheumatoid arthritis with rheumatoid factor of multiple sites without organ or systems involvement: Secondary | ICD-10-CM

## 2019-10-25 NOTE — Telephone Encounter (Signed)
Last Visit: 08/09/2019 Next Visit: 01/10/2020 Labs: CBC 09/21/2019 Hgb 10.2, Hct 31.6, RDW 18.9, 08/24/2019 CMP WNL Eye exam:  12/15/2018.  Current Dose per office note 08/09/2019: Plaquenil 200 mg 1 tablet by mouth twice daily DX: Rheumatoid arthritis involving multiple sites with positive rheumatoid factor   Okay to refill Plaquenil?

## 2019-10-28 ENCOUNTER — Ambulatory Visit: Payer: Medicare Other | Admitting: Orthopedic Surgery

## 2019-11-13 ENCOUNTER — Other Ambulatory Visit: Payer: Self-pay | Admitting: Internal Medicine

## 2019-11-17 ENCOUNTER — Ambulatory Visit: Payer: BC Managed Care – PPO | Admitting: Podiatry

## 2019-11-24 ENCOUNTER — Other Ambulatory Visit: Payer: Self-pay | Admitting: Endocrinology

## 2019-11-24 ENCOUNTER — Other Ambulatory Visit: Payer: Self-pay | Admitting: Rheumatology

## 2019-11-24 ENCOUNTER — Other Ambulatory Visit: Payer: Self-pay

## 2019-11-24 DIAGNOSIS — Z79899 Other long term (current) drug therapy: Secondary | ICD-10-CM | POA: Diagnosis not present

## 2019-11-24 NOTE — Telephone Encounter (Signed)
Last Visit: 08/09/2019 Next Visit: 01/10/2020 Labs: 08/09/2019 Hgb and hct are low but stable. WBC count is WNL. CMP WNL  Current Dose per office note 08/09/2019: MTX7tablets po once weekly  Attempted to contact patient to advise she is due to update labs.  Okay to refill 30 day supply MTX and Folic Acid?

## 2019-11-25 ENCOUNTER — Other Ambulatory Visit (INDEPENDENT_AMBULATORY_CARE_PROVIDER_SITE_OTHER): Payer: Medicare Other

## 2019-11-25 ENCOUNTER — Other Ambulatory Visit: Payer: Self-pay

## 2019-11-25 DIAGNOSIS — Z794 Long term (current) use of insulin: Secondary | ICD-10-CM

## 2019-11-25 DIAGNOSIS — E119 Type 2 diabetes mellitus without complications: Secondary | ICD-10-CM | POA: Diagnosis not present

## 2019-11-25 LAB — COMPLETE METABOLIC PANEL WITH GFR
AG Ratio: 1.8 (calc) (ref 1.0–2.5)
ALT: 15 U/L (ref 6–29)
AST: 16 U/L (ref 10–35)
Albumin: 3.9 g/dL (ref 3.6–5.1)
Alkaline phosphatase (APISO): 46 U/L (ref 37–153)
BUN: 11 mg/dL (ref 7–25)
CO2: 27 mmol/L (ref 20–32)
Calcium: 9.8 mg/dL (ref 8.6–10.4)
Chloride: 104 mmol/L (ref 98–110)
Creat: 0.75 mg/dL (ref 0.60–0.93)
GFR, Est African American: 92 mL/min/{1.73_m2} (ref >=60)
GFR, Est Non African American: 80 mL/min/{1.73_m2} (ref >=60)
Globulin: 2.2 g/dL (calc) (ref 1.9–3.7)
Glucose, Bld: 90 mg/dL (ref 65–99)
Potassium: 3.7 mmol/L (ref 3.5–5.3)
Sodium: 141 mmol/L (ref 135–146)
Total Bilirubin: 0.3 mg/dL (ref 0.2–1.2)
Total Protein: 6.1 g/dL (ref 6.1–8.1)

## 2019-11-25 LAB — CBC WITH DIFFERENTIAL/PLATELET
Absolute Monocytes: 726 cells/uL (ref 200–950)
Basophils Absolute: 74 cells/uL (ref 0–200)
Basophils Relative: 0.6 %
Eosinophils Absolute: 197 cells/uL (ref 15–500)
Eosinophils Relative: 1.6 %
HCT: 34.6 % — ABNORMAL LOW (ref 35.0–45.0)
Hemoglobin: 11.4 g/dL — ABNORMAL LOW (ref 11.7–15.5)
Lymphs Abs: 3875 cells/uL (ref 850–3900)
MCH: 27.5 pg (ref 27.0–33.0)
MCHC: 32.9 g/dL (ref 32.0–36.0)
MCV: 83.6 fL (ref 80.0–100.0)
MPV: 10.7 fL (ref 7.5–12.5)
Monocytes Relative: 5.9 %
Neutro Abs: 7429 cells/uL (ref 1500–7800)
Neutrophils Relative %: 60.4 %
Platelets: 279 10*3/uL (ref 140–400)
RBC: 4.14 10*6/uL (ref 3.80–5.10)
RDW: 18.6 % — ABNORMAL HIGH (ref 11.0–15.0)
Total Lymphocyte: 31.5 %
WBC: 12.3 10*3/uL — ABNORMAL HIGH (ref 3.8–10.8)

## 2019-11-25 LAB — COMPREHENSIVE METABOLIC PANEL
ALT: 17 U/L (ref 0–35)
AST: 17 U/L (ref 0–37)
Albumin: 4 g/dL (ref 3.5–5.2)
Alkaline Phosphatase: 45 U/L (ref 39–117)
BUN: 11 mg/dL (ref 6–23)
CO2: 30 mEq/L (ref 19–32)
Calcium: 9.9 mg/dL (ref 8.4–10.5)
Chloride: 104 mEq/L (ref 96–112)
Creatinine, Ser: 0.79 mg/dL (ref 0.40–1.20)
GFR: 86.5 mL/min (ref >60.00)
Glucose, Bld: 79 mg/dL (ref 70–99)
Potassium: 3.9 mEq/L (ref 3.5–5.1)
Sodium: 141 mEq/L (ref 135–145)
Total Bilirubin: 0.3 mg/dL (ref 0.2–1.2)
Total Protein: 7 g/dL (ref 6.0–8.3)

## 2019-11-25 LAB — MICROALBUMIN / CREATININE URINE RATIO
Creatinine,U: 60.4 mg/dL
Microalb Creat Ratio: 31.2 mg/g — ABNORMAL HIGH (ref 0.0–30.0)
Microalb, Ur: 18.8 mg/dL — ABNORMAL HIGH (ref 0.0–1.9)

## 2019-11-25 LAB — HEMOGLOBIN A1C: Hgb A1c MFr Bld: 5.7 % (ref 4.6–6.5)

## 2019-11-25 NOTE — Progress Notes (Signed)
WBC count is mildly elevated. Please clarify if the patient has had any recent infections.  Hgb and Hct are low but improving.   CMP WNL.

## 2019-11-26 ENCOUNTER — Other Ambulatory Visit: Payer: Self-pay

## 2019-11-26 ENCOUNTER — Ambulatory Visit (HOSPITAL_COMMUNITY)
Admission: EM | Admit: 2019-11-26 | Discharge: 2019-11-26 | Disposition: A | Discharge status: Home / Self Care | Payer: Medicare Other | Attending: Emergency Medicine | Admitting: Emergency Medicine

## 2019-11-26 ENCOUNTER — Encounter (HOSPITAL_COMMUNITY): Payer: Self-pay

## 2019-11-26 DIAGNOSIS — T148XXA Other injury of unspecified body region, initial encounter: Secondary | ICD-10-CM

## 2019-11-26 DIAGNOSIS — R109 Unspecified abdominal pain: Secondary | ICD-10-CM | POA: Diagnosis not present

## 2019-11-26 LAB — POCT URINALYSIS DIPSTICK, ED / UC
Bilirubin Urine: NEGATIVE
Glucose, UA: >=1000 mg/dL — AB
Hgb urine dipstick: NEGATIVE
Ketones, ur: NEGATIVE mg/dL
Leukocytes,Ua: NEGATIVE
Nitrite: NEGATIVE
Protein, ur: NEGATIVE mg/dL
Specific Gravity, Urine: 1.01 (ref 1.005–1.030)
Urobilinogen, UA: 0.2 mg/dL (ref 0.0–1.0)
pH: 5.5 (ref 5.0–8.0)

## 2019-11-26 NOTE — ED Provider Notes (Signed)
Marissa Roberts   [id number] 11/26/19 Arrival Time: 1706  BD:[id number] PAIN  SUBJECTIVE: History from: patient. [place] is a 74 y.o. female complains of right flank pain that began 4 days ago.  Denies recent cough or other trauma to the area.  Has been drinking cranberry juice and has used icy hot to the area.  IcyHot has provided some temporary relief. Denies a precipitating event or specific injury.  Reports pain as intermittent and sharp.  Has tried OTC medications without relief.  Symptoms are made worse with activity.  Denies similar symptoms in the past. Denies fever, chills, erythema, ecchymosis, effusion, weakness, numbness and tingling, saddle paresthesias, loss of bowel or bladder function.      ROS: As per HPI.  All other pertinent ROS negative.     Past Medical History:  Diagnosis Date  . Arthritis   . Bronchitis    hx of  . Diabetes mellitus   . Family history of adverse reaction to anesthesia    per patient, "daughter threw up after a knee surgery"  . GERD (gastroesophageal reflux disease)   . Hypercholesteremia   . Hypertension   . Hypothyroidism   . Pneumonia    hx of  . PONV (postoperative nausea and vomiting)   . Thyroid disease    Past Surgical History:  Procedure Laterality Date  . ABDOMINAL HYSTERECTOMY     partial  . BREAST SURGERY     lumpectomy left breast  . carpel tunnel    . COLONOSCOPY W/ POLYPECTOMY    . FOOT SURGERY    . knee arhtroscopy    . KNEE ARTHROPLASTY    . SHOULDER ARTHROSCOPY WITH SUBACROMIAL DECOMPRESSION, ROTATOR CUFF REPAIR AND BICEP TENDON REPAIR Right 05/26/2012   Procedure: Right Shoulder Diagnostic Operative Arthroscopy, Subacromial Decompression, Biceps Tenodesis, Mini Open Rotator Cuff Repair;  Surgeon: [name], MD;  Location: [facility];  Service: Orthopedics;  Laterality: Right;  Right Shoulder Diagnostic Operative Arthroscopy, Subacromial Decompression, Biceps Tenodesis, Mini Open Rotator Cuff Repair  .  TONSILLECTOMY    . TOTAL KNEE ARTHROPLASTY Left 07/01/2019   Procedure: LEFT TOTAL KNEE ARTHROPLASTY;  Surgeon: [name], MD;  Location: [facility];  Service: Orthopedics;  Laterality: Left;   Allergies  Allergen Reactions  . Contrast Media [Iodinated Diagnostic Agents]     Throat swells from ct con tra  . Adhesive [Tape]     No paper tape, causes blisters  . Erythromycin Rash  . Penicillins Rash    Did it involve swelling of the face/tongue/throat, SOB, or low BP? No Did it involve sudden or severe rash/hives, skin peeling, or any reaction on the inside of your mouth or nose? No Did you need to seek medical attention at a hospital or doctor's office? No When did it last happen?Many years If all above answers are "NO", may proceed with cephalosporin use.    No current facility-administered medications on file prior to encounter.   Current Outpatient Medications on File Prior to Encounter  Medication Sig Dispense Refill  . amLODipine (NORVASC) 5 MG tablet Take 1 tablet (5 mg total) by mouth daily. 90 tablet 3  . BD PEN NEEDLE NANO U/F 32G X 4 MM MISC USE  4 TIMES DAILY TO  INJECT  INSULIN 200 each 3  . Biotin 10000 MCG TABS Take 10,000 mcg by mouth daily.    . calcium-vitamin D (OSCAL WITH D) 500-200 MG-UNIT tablet Take 2 tablets by mouth daily with breakfast.    .  cephALEXin (KEFLEX) 500 MG capsule Take 4 capsules (2,000 mg) 30-60 minutes prior to dental procedure 8 capsule 0  . FARXIGA 5 MG TABS tablet Take 1 tablet by mouth once daily 30 tablet 3  . fluticasone (FLONASE) 50 MCG/ACT nasal spray Place 2 sprays into both nostrils 2 (two) times daily as needed for allergies or rhinitis.    . folic acid (FOLVITE) 1 MG tablet Take 2 tablets by mouth once daily 180 tablet 0  . furosemide (LASIX) 40 MG tablet Take 2 tablets by mouth once daily 180 tablet 1  . gabapentin (NEURONTIN) 300 MG capsule Take 1 capsule (300 mg total) by mouth 3 (three) times daily. 90 capsule 0  .  Glucosamine HCl (GLUCOSAMINE PO) Take 1 capsule by mouth daily.     Marland Kitchen glucose blood (ONETOUCH VERIO) test strip Use Onetouch verio test strips as instructed to check blood sugars 4 times daily. DX:E11.65 400 each 1  . hydroxychloroquine (PLAQUENIL) 200 MG tablet Take 1 tablet by mouth twice daily 180 tablet 0  . insulin degludec (TRESIBA FLEXTOUCH) 200 UNIT/ML FlexTouch Pen INJECT 36 UNITS SUBCUTANEOUSLY IN THE MORNING 9 mL 2  . levothyroxine (SYNTHROID) 137 MCG tablet Take 1 tablet by mouth once daily 90 tablet 0  . linaclotide (LINZESS) 290 MCG CAPS capsule Take 1 capsule (290 mcg total) by mouth daily as needed. 90 capsule 3  . metFORMIN (GLUCOPHAGE) 1000 MG tablet TAKE 1 TABLET BY MOUTH TWICE DAILY WITH A MEAL 180 tablet 0  . methocarbamol (ROBAXIN) 500 MG tablet Take 1 tablet (500 mg total) by mouth every 8 (eight) hours as needed for muscle spasms. 30 tablet 0  . methotrexate (RHEUMATREX) 2.5 MG tablet TAKE 7 TABLETS BY MOUTH ONCE A WEEK (17.5  MG  TOTAL)  PROTECT  FROM  LIGHT 28 tablet 0  . montelukast (SINGULAIR) 10 MG tablet TAKE 1 TABLET BY MOUTH AT BEDTIME 90 tablet 0  . Multiple Vitamin (MULTIVITAMIN WITH MINERALS) TABS tablet Take 1 tablet by mouth daily.    Marland Kitchen NOVOLOG FLEXPEN 100 UNIT/ML FlexPen INJECT 10-15 UNITS SUBCUTANEOUSLY THREE TIMES DAILY BEFORE MEAL(S) 15 mL 1  . OneTouch Delica Lancets 97W MISC USE TO CHECK BLOOD SUGAR UP TO THREE TIMES A DAY 2 HOURS AFTER A MEAL AND UP TO 3 TIMES A WEEK UPON WAKING UP 300 each 0  . potassium chloride (KLOR-CON) 10 MEQ tablet Take 1 tablet by mouth twice daily 180 tablet 0  . rosuvastatin (CRESTOR) 40 MG tablet Take 1 tablet (40 mg total) by mouth daily. 90 tablet 3  . triamcinolone cream (KENALOG) 0.1 % Apply 1 application topically 3 (three) times daily. 100 g 0  . valsartan-hydrochlorothiazide (DIOVAN-HCT) 320-12.5 MG tablet Take 1 tablet by mouth daily. 90 tablet 0  . [DISCONTINUED] Saxagliptin-Metformin 07-998 MG TB24 Take 1 tablet by  mouth daily. 30 tablet    Social History   Socioeconomic History  . Marital status: Married    Spouse name: Not on file  . Number of children: Not on file  . Years of education: Not on file  . Highest education level: Not on file  Occupational History  . Not on file  Tobacco Use  . Smoking status: Former Smoker    Quit date: 04/01/1990    Years since quitting: 29.6  . Smokeless tobacco: Never Used  Vaping Use  . Vaping Use: Never used  Substance and Sexual Activity  . Alcohol use: Not Currently    Comment: 06/22/2019: per patient about 20-25  years ago  . Drug use: No  . Sexual activity: Never    Birth control/protection: Abstinence  Other Topics Concern  . Not on file  Social History Narrative  . Not on file   Social Determinants of Health   Financial Resource Strain:   . Difficulty of Paying Living Expenses: Not on file  Food Insecurity:   . Worried About Charity fundraiser in the Last Year: Not on file  . Ran Out of Food in the Last Year: Not on file  Transportation Needs:   . Lack of Transportation (Medical): Not on file  . Lack of Transportation (Non-Medical): Not on file  Physical Activity:   . Days of Exercise per Week: Not on file  . Minutes of Exercise per Session: Not on file  Stress:   . Feeling of Stress : Not on file  Social Connections:   . Frequency of Communication with Friends and Family: Not on file  . Frequency of Social Gatherings with Friends and Family: Not on file  . Attends Religious Services: Not on file  . Active Member of Clubs or Organizations: Not on file  . Attends Archivist Meetings: Not on file  . Marital Status: Not on file  Intimate Partner Violence:   . Fear of Current or Ex-Partner: Not on file  . Emotionally Abused: Not on file  . Physically Abused: Not on file  . Sexually Abused: Not on file   Family History  Problem Relation Age of Onset  . Diabetes Mother   . Heart disease Mother   . Hyperlipidemia Mother     . Diabetes Father   . Diabetes Sister   . Hypertension Sister   . Heart disease Sister   . Dementia Sister   . Heart disease Brother   . Healthy Daughter   . Healthy Daughter   . Healthy Daughter   . Healthy Son     OBJECTIVE:  Vitals:   11/26/19 1907  BP: (!) 177/74  Pulse: 82  Resp: 18  Temp: 98.4 F (36.9 C)  TempSrc: Oral  SpO2: 100%    General appearance: ALERT; in no acute distress.  Head: NCAT Lungs: Normal respiratory effort CV:  pulses 2+ bilaterally. Cap refill < 2 seconds Musculoskeletal:  Inspection: Skin warm, dry, clear and intact without obvious erythema, effusion, or ecchymosis.  Palpation: right flank mildly tender to palpation ROM: FROM active and passive Skin: warm and dry Neurologic: Ambulates without difficulty; Sensation intact about the upper/ lower extremities Psychological: alert and cooperative; normal mood and affect  DIAGNOSTIC STUDIES:  No results found.   ASSESSMENT & PLAN:  1. Muscle strain   2. Right flank pain    Continue conservative management of rest, ice, and gentle stretches Take tylenol as needed for pain relief  May take home muscle relaxer at nighttime for symptomatic relief. Avoid driving or operating heavy machinery while using medication. Follow up with PCP if symptoms persist Return or go to the ER if you have any new or worsening symptoms (fever, chills, chest pain, abdominal pain, changes in bowel or bladder habits, pain radiating into lower legs)   Reviewed expectations re: course of current medical issues. Questions answered. Outlined signs and symptoms indicating need for more acute intervention. Patient verbalized understanding. After Visit Summary given.       Faustino Congress, NP 11/26/19 2000

## 2019-11-26 NOTE — ED Triage Notes (Signed)
Pt present Flank pain on the right side, symptom started on Monday.  Pt denies any other symptoms

## 2019-11-26 NOTE — ED Notes (Signed)
Pt verbally abusive to this staff.

## 2019-11-26 NOTE — Discharge Instructions (Addendum)
I think you are experiencing a muscle strain  I would have you take your tylenol for pain  You may use your muscle relaxer that you have at home at bedtime as needed  Follow up with specialists as scheduled

## 2019-11-30 ENCOUNTER — Other Ambulatory Visit: Payer: Self-pay

## 2019-11-30 ENCOUNTER — Ambulatory Visit (INDEPENDENT_AMBULATORY_CARE_PROVIDER_SITE_OTHER): Payer: Medicare Other | Admitting: Endocrinology

## 2019-11-30 ENCOUNTER — Encounter: Payer: Self-pay | Admitting: Endocrinology

## 2019-11-30 ENCOUNTER — Other Ambulatory Visit: Payer: Self-pay | Admitting: *Deleted

## 2019-11-30 VITALS — BP 144/74 | HR 71 | Ht 65.0 in | Wt 234.4 lb

## 2019-11-30 DIAGNOSIS — E039 Hypothyroidism, unspecified: Secondary | ICD-10-CM

## 2019-11-30 DIAGNOSIS — I10 Essential (primary) hypertension: Secondary | ICD-10-CM | POA: Diagnosis not present

## 2019-11-30 DIAGNOSIS — Z794 Long term (current) use of insulin: Secondary | ICD-10-CM

## 2019-11-30 DIAGNOSIS — E119 Type 2 diabetes mellitus without complications: Secondary | ICD-10-CM

## 2019-11-30 MED ORDER — AMLODIPINE BESYLATE 5 MG PO TABS
5.0000 mg | ORAL_TABLET | Freq: Every day | ORAL | 3 refills | Status: DC
Start: 2019-11-30 — End: 2020-05-04

## 2019-11-30 MED ORDER — DAPAGLIFLOZIN PROPANEDIOL 5 MG PO TABS
5.0000 mg | ORAL_TABLET | Freq: Every day | ORAL | 3 refills | Status: DC
Start: 2019-11-30 — End: 2019-11-30

## 2019-11-30 MED ORDER — AMLODIPINE BESYLATE 5 MG PO TABS
5.0000 mg | ORAL_TABLET | Freq: Every day | ORAL | 3 refills | Status: DC
Start: 2019-11-30 — End: 2019-11-30

## 2019-11-30 MED ORDER — DAPAGLIFLOZIN PROPANEDIOL 5 MG PO TABS
5.0000 mg | ORAL_TABLET | Freq: Every day | ORAL | 3 refills | Status: DC
Start: 2019-11-30 — End: 2021-01-19

## 2019-11-30 NOTE — Progress Notes (Signed)
Patient ID: Marissa Roberts, female   DOB: [birth year], 74 y.o.   MRN: [id number]               Reason for Appointment:  Follow-up for diabetes and hypertension   History of Present Illness:             Type 2 diabetes mellitus, date of diagnosis: 2005       INSULIN regimen is  [place] 36units am, Novolog  10  units bid- tid ac  Oral hypoglycemic drugs the patient is taking are: Metformin 2 g daily, Farxiga 5 mg daily   Current blood sugar patterns, management and problems identified:  Her A1c is again excellent at 5.7, previously 5.4    Since she was having low normal sugars on her last visit her Tyler Aas was reduced by 4 units  She has been on [place] since 05/2019 with consistent improvement  Also needing less mealtime insulin, mostly taking 5-10 units depending on what she is eating  With this regimen her FASTING readings are still excellent and occasionally low normal  Postprandial readings in the evenings are fairly good  However her weight loss has leveled out  She is not able to exercise because of knee pain but has not looked at doing water aerobics  No side effects or renal dysfunction from [employer] usually at 6-7pm  Side effects from medications have been:none  Compliance with the medical regimen: Fair  Glucose monitoring:  done 1 time a day         Glucometer:  One Touch.       Blood Glucose data from review of monitor download:   PRE-MEAL Fasting Lunch Dinner Bedtime Overall  Glucose range:  83-104    108  83-136  Mean/median:  95     102   POST-MEAL PC Breakfast PC Lunch PC Dinner  Glucose range:    90-136  Mean/median:    114   Previously:  PRE-MEAL Fasting Lunch Dinner Bedtime Overall  Glucose range:  73-107   68-112    Mean/median:  85    91   POST-MEAL PC Breakfast PC Lunch PC Dinner  Glucose range:  106-118  162  80, 88  Mean/median:       Self-care:   Meals: 3 meals per day. Breakfast is usually grits with or without  eggs or meat Not always restricting fat intake or sweet tea  when eating out        Exercise:  Minimal, knee pain  Dietician visit, most recent:2014.               Weight history:  Wt Readings from Last 3 Encounters:  11/30/19 234 lb 6.4 oz (106.3 kg)  10/11/19 232 lb (105.2 kg)  09/21/19 234 lb (106.1 kg)    Glycemic control:   Lab Results  Component Value Date   HGBA1C 5.7 11/25/2019   HGBA1C 5.4 08/24/2019   HGBA1C 6.3 (H) 06/22/2019   Lab Results  Component Value Date   MICROALBUR 18.8 (H) 11/25/2019   LDLCALC 61 08/24/2019   CREATININE 0.79 11/25/2019   No results found for: FRUCTOSAMINE    Past history:  She thinks her blood sugars were only mildly increased at borderline levels at the onsetper Most likely she was treated with metformin initially and this has been continued.   At some point she was also tried on [place] and [place] for improving her control. She is not sure why she  was started on insulin 5 years ago, presumably for worsening hyperglycemia Also not clear if she has tried various insulin regimens before starting Lantus and NovoLog  Previously had tried the V-go pump but subsequently she stopped doing the V-go pump because of the cost, skin irritation and local discomfort   Allergies as of 11/30/2019      Reactions   Contrast Media [iodinated Diagnostic Agents]    Throat swells from ct con tra   Adhesive [tape]    No paper tape, causes blisters   Erythromycin Rash   Penicillins Rash   Did it involve swelling of the face/tongue/throat, SOB, or low BP? No Did it involve sudden or severe rash/hives, skin peeling, or any reaction on the inside of your mouth or nose? No Did you need to seek medical attention at a hospital or doctor's office? No When did it last happen?Many years If all above answers are "NO", may proceed with cephalosporin use.      Medication List       Accurate as of November 30, 2019 12:56 PM. If you have any  questions, ask your nurse or doctor.        amLODipine 5 MG tablet Commonly known as: NORVASC Take 1 tablet (5 mg total) by mouth daily.   BD Pen Needle Nano U/F 32G X 4 MM Misc Generic drug: Insulin Pen Needle USE  4 TIMES DAILY TO  INJECT  INSULIN   Biotin 10000 MCG Tabs Take 10,000 mcg by mouth daily.   calcium-vitamin D 500-200 MG-UNIT tablet Commonly known as: OSCAL WITH D Take 2 tablets by mouth daily with breakfast.   cephALEXin 500 MG capsule Commonly known as: KEFLEX Take 4 capsules (2,000 mg) 30-60 minutes prior to dental procedure   dapagliflozin propanediol 5 MG Tabs tablet Commonly known as: Farxiga Take 1 tablet (5 mg total) by mouth daily. What changed: how much to take Changed by: Glori Bickers, CMA   fluticasone 50 MCG/ACT nasal spray Commonly known as: FLONASE Place 2 sprays into both nostrils 2 (two) times daily as needed for allergies or rhinitis.   folic acid 1 MG tablet Commonly known as: FOLVITE Take 2 tablets by mouth once daily   furosemide 40 MG tablet Commonly known as: LASIX Take 2 tablets by mouth once daily   gabapentin 300 MG capsule Commonly known as: NEURONTIN Take 1 capsule (300 mg total) by mouth 3 (three) times daily.   GLUCOSAMINE PO Take 1 capsule by mouth daily.   hydroxychloroquine 200 MG tablet Commonly known as: PLAQUENIL Take 1 tablet by mouth twice daily   levothyroxine 137 MCG tablet Commonly known as: SYNTHROID Take 1 tablet by mouth once daily   linaclotide 290 MCG Caps capsule Commonly known as: LINZESS Take 1 capsule (290 mcg total) by mouth daily as needed.   metFORMIN 1000 MG tablet Commonly known as: GLUCOPHAGE TAKE 1 TABLET BY MOUTH TWICE DAILY WITH A MEAL   methocarbamol 500 MG tablet Commonly known as: Robaxin Take 1 tablet (500 mg total) by mouth every 8 (eight) hours as needed for muscle spasms.   methotrexate 2.5 MG tablet Commonly known as: RHEUMATREX TAKE 7 TABLETS BY MOUTH ONCE A  WEEK (17.5  MG  TOTAL)  PROTECT  FROM  LIGHT   montelukast 10 MG tablet Commonly known as: SINGULAIR TAKE 1 TABLET BY MOUTH AT BEDTIME   multivitamin with minerals Tabs tablet Take 1 tablet by mouth daily.   NovoLOG FlexPen 100 UNIT/ML FlexPen Generic drug:  insulin aspart INJECT 10-15 UNITS SUBCUTANEOUSLY THREE TIMES DAILY BEFORE MEAL(S)   OneTouch Delica Lancets 17P Misc USE TO CHECK BLOOD SUGAR UP TO THREE TIMES A DAY 2 HOURS AFTER A MEAL AND UP TO 3 TIMES A WEEK UPON WAKING UP   OneTouch Verio test strip Generic drug: glucose blood Use Onetouch verio test strips as instructed to check blood sugars 4 times daily. DX:E11.65   potassium chloride 10 MEQ tablet Commonly known as: KLOR-CON Take 1 tablet by mouth twice daily   rosuvastatin 40 MG tablet Commonly known as: CRESTOR Take 1 tablet (40 mg total) by mouth daily.   Tyler Aas FlexTouch 200 UNIT/ML FlexTouch Pen Generic drug: insulin degludec INJECT 36 UNITS SUBCUTANEOUSLY IN THE MORNING   triamcinolone cream 0.1 % Commonly known as: KENALOG Apply 1 application topically 3 (three) times daily.   valsartan-hydrochlorothiazide 320-12.5 MG tablet Commonly known as: DIOVAN-HCT Take 1 tablet by mouth daily.       Allergies:  Allergies  Allergen Reactions  . Contrast Media [Iodinated Diagnostic Agents]     Throat swells from ct con tra  . Adhesive [Tape]     No paper tape, causes blisters  . Erythromycin Rash  . Penicillins Rash    Did it involve swelling of the face/tongue/throat, SOB, or low BP? No Did it involve sudden or severe rash/hives, skin peeling, or any reaction on the inside of your mouth or nose? No Did you need to seek medical attention at a hospital or doctor's office? No When did it last happen?Many years If all above answers are "NO", may proceed with cephalosporin use.     Past Medical History:  Diagnosis Date  . Arthritis   . Bronchitis    hx of  . Diabetes mellitus   . Family  history of adverse reaction to anesthesia    per patient, "daughter threw up after a knee surgery"  . GERD (gastroesophageal reflux disease)   . Hypercholesteremia   . Hypertension   . Hypothyroidism   . Pneumonia    hx of  . PONV (postoperative nausea and vomiting)   . Thyroid disease     Past Surgical History:  Procedure Laterality Date  . ABDOMINAL HYSTERECTOMY     partial  . BREAST SURGERY     lumpectomy left breast  . carpel tunnel    . COLONOSCOPY W/ POLYPECTOMY    . FOOT SURGERY    . knee arhtroscopy    . KNEE ARTHROPLASTY    . SHOULDER ARTHROSCOPY WITH SUBACROMIAL DECOMPRESSION, ROTATOR CUFF REPAIR AND BICEP TENDON REPAIR Right 05/26/2012   Procedure: Right Shoulder Diagnostic Operative Arthroscopy, Subacromial Decompression, Biceps Tenodesis, Mini Open Rotator Cuff Repair;  Surgeon: [name], MD;  Location: [facility];  Service: Orthopedics;  Laterality: Right;  Right Shoulder Diagnostic Operative Arthroscopy, Subacromial Decompression, Biceps Tenodesis, Mini Open Rotator Cuff Repair  . TONSILLECTOMY    . TOTAL KNEE ARTHROPLASTY Left 07/01/2019   Procedure: LEFT TOTAL KNEE ARTHROPLASTY;  Surgeon: [name], MD;  Location: [facility];  Service: Orthopedics;  Laterality: Left;    Family History  Problem Relation Age of Onset  . Diabetes Mother   . Heart disease Mother   . Hyperlipidemia Mother   . Diabetes Father   . Diabetes Sister   . Hypertension Sister   . Heart disease Sister   . Dementia Sister   . Heart disease Brother   . Healthy Daughter   . Healthy Daughter   . Healthy Daughter   .  Healthy Son     Social History:  reports that she quit smoking about 29 years ago. She has never used smokeless tobacco. She reports previous alcohol use. She reports that she does not use drugs.    Review of Systems   HYPERTENSION: She is on amlodipine 5 mg once daily and Diovan HCT 320/12.5  Also has microalbuminuria and because of this as well as tendency to  edema she was tried on diltiazem but she felt that this was causing hair loss and constipation and is back on amlodipine  Her blood pressure appears to be relatively higher, she was told to take half tablet of amlodipine on the last visit but she now says that she did not have a 5 mg or 10 mg amlodipine prescription She did not take her blood pressure medication today  She is not checking blood pressure at home  BP Readings from Last 3 Encounters:  11/30/19 (!) 144/74  11/26/19 (!) 177/74  10/11/19 140/84    On Lasix 40 mg for edema daily with good control The dose was reduced when starting Farxiga   LIPIDS:  She is taking rosuvastatin 40 mg, previously LDL was high with simvastatin 20 mg LDL below 100 Crestor prescribed by PCP      Lab Results  Component Value Date   CHOL 133 08/24/2019   HDL 59.70 08/24/2019   LDLCALC 61 08/24/2019   LDLDIRECT 129.5 02/06/2012   TRIG 64.0 08/24/2019   CHOLHDL 2 08/24/2019                  Thyroid:      She has been hypothyroid for over 40 years and is taking a supplement of 137 g levothyroxine, 6 tablets per week She takes levothyroxine before breakfast daily She feels fairly well Has not needed a change in her dosage for some time  TSH has been consistently normal  Last labs:   Lab Results  Component Value Date   TSH 1.75 08/24/2019   TSH 1.24 01/22/2019   TSH 0.63 07/21/2018   FREET4 1.58 08/24/2019   FREET4 1.11 07/21/2018   FREET4 1.05 10/10/2017   Iron deficiency anemia: She has constipation from iron supplements and is doing better now with her iron infusion   Lab Results  Component Value Date   HGB 11.4 (L) 11/24/2019     Physical Examination:  BP (!) 144/74   Pulse 71   Ht 5\' 5"  (1.651 m)   Wt 234 lb 6.4 oz (106.3 kg)   SpO2 97%   BMI 39.01 kg/m     ASSESSMENT/PLAN:  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:  See history of present illness for detailed discussion of current diabetes management, blood sugar  patterns and problems identified  She is on basal bolus insulin, Farxiga and metformin  A1c is excellent at 5.7  She has had close to normal blood sugars both fasting and after meals Although she did lose significant weight with adding [place] she is not losing anymore and needs to be exercising which she has difficulty doing Taking small doses of NovoLog based on meal size but not clear if she needs it with every meal  Recommendations:   Reduce TRESIBA by 2 units weekly if blood sugars in the morning stay below 90  May continue 5 to 10 units of NovoLog based on her meal size and only when she is having carbohydrates, need to avoid low sugars  Rotate times of blood sugar monitoring  She will look  into doing water exercises  She can take [place] with breakfast since she has some nausea with taking it earlier in the morning  HYPERTENSION with microalbuminuria: Blood pressure is relatively higher today but she did not take her medications this morning For now she can continue 5 mg amlodipine, new prescription sent Encouraged her to start checking blood pressure at home also  Renal function stable with continuing [place]: Now consistently controlled  HYPOTHYROIDISM: her TSH will be rechecked on the next visit   Patient Instructions  10 Tresiba and if am sugars stay < 90 go down 2 more  Look into water exercises    Elayne Snare 11/30/2019, 12:56 PM   Note: This office note was prepared with Dragon voice recognition system technology. Any transcriptional errors that result from this process are unintentional.

## 2019-11-30 NOTE — Patient Instructions (Addendum)
34 Tresiba and if am sugars stay < 90 go down 2 more  Look into water exercises

## 2019-12-07 ENCOUNTER — Other Ambulatory Visit: Payer: Self-pay | Admitting: Endocrinology

## 2019-12-13 ENCOUNTER — Other Ambulatory Visit: Payer: Self-pay | Admitting: Endocrinology

## 2019-12-14 DIAGNOSIS — H40023 Open angle with borderline findings, high risk, bilateral: Secondary | ICD-10-CM | POA: Diagnosis not present

## 2019-12-14 DIAGNOSIS — E119 Type 2 diabetes mellitus without complications: Secondary | ICD-10-CM | POA: Diagnosis not present

## 2019-12-14 DIAGNOSIS — H524 Presbyopia: Secondary | ICD-10-CM | POA: Diagnosis not present

## 2019-12-14 LAB — HM DIABETES EYE EXAM

## 2019-12-15 ENCOUNTER — Other Ambulatory Visit: Payer: Self-pay

## 2019-12-15 ENCOUNTER — Ambulatory Visit (INDEPENDENT_AMBULATORY_CARE_PROVIDER_SITE_OTHER): Payer: Medicare Other | Admitting: Podiatry

## 2019-12-15 ENCOUNTER — Encounter: Payer: Self-pay | Admitting: Podiatry

## 2019-12-15 DIAGNOSIS — L84 Corns and callosities: Secondary | ICD-10-CM

## 2019-12-15 DIAGNOSIS — M79674 Pain in right toe(s): Secondary | ICD-10-CM | POA: Diagnosis not present

## 2019-12-15 DIAGNOSIS — E119 Type 2 diabetes mellitus without complications: Secondary | ICD-10-CM | POA: Diagnosis not present

## 2019-12-15 DIAGNOSIS — Z794 Long term (current) use of insulin: Secondary | ICD-10-CM

## 2019-12-15 DIAGNOSIS — M79675 Pain in left toe(s): Secondary | ICD-10-CM

## 2019-12-15 DIAGNOSIS — B351 Tinea unguium: Secondary | ICD-10-CM | POA: Diagnosis not present

## 2019-12-19 NOTE — Progress Notes (Signed)
Subjective: Marissa Roberts presents today preventative diabetic foot care and painful corn(s) b/l feet and painful mycotic toenails b/l that are difficult to trim. Pain interferes with ambulation. Aggravating factors include wearing enclosed shoe gear. Pain is relieved with periodic professional debridement.   She voices no new pedal problems on today's visit.  [name], MD is patient's PCP. Last visit was: 10/11/2019.  Past Medical History:  Diagnosis Date  . Arthritis   . Bronchitis    hx of  . Diabetes mellitus   . Family history of adverse reaction to anesthesia    per patient, "daughter threw up after a knee surgery"  . GERD (gastroesophageal reflux disease)   . Hypercholesteremia   . Hypertension   . Hypothyroidism   . Pneumonia    hx of  . PONV (postoperative nausea and vomiting)   . Thyroid disease      Current Outpatient Medications on File Prior to Visit  Medication Sig Dispense Refill  . amLODipine (NORVASC) 5 MG tablet Take 1 tablet (5 mg total) by mouth daily. 90 tablet 3  . BD PEN NEEDLE NANO U/F 32G X 4 MM MISC USE  4 TIMES DAILY TO  INJECT  INSULIN 200 each 3  . Biotin 10000 MCG TABS Take 10,000 mcg by mouth daily.    . calcium-vitamin D (OSCAL WITH D) 500-200 MG-UNIT tablet Take 2 tablets by mouth daily with breakfast.    . cephALEXin (KEFLEX) 500 MG capsule Take 4 capsules (2,000 mg) 30-60 minutes prior to dental procedure 8 capsule 0  . dapagliflozin propanediol (FARXIGA) 5 MG TABS tablet Take 1 tablet (5 mg total) by mouth daily. 90 tablet 3  . fluticasone (FLONASE) 50 MCG/ACT nasal spray Place 2 sprays into both nostrils 2 (two) times daily as needed for allergies or rhinitis.    . folic acid (FOLVITE) 1 MG tablet Take 2 tablets by mouth once daily 180 tablet 0  . furosemide (LASIX) 40 MG tablet Take 2 tablets by mouth once daily 180 tablet 1  . gabapentin (NEURONTIN) 300 MG capsule Take 1 capsule (300 mg total) by mouth 3 (three) times daily.  90 capsule 0  . Glucosamine HCl (GLUCOSAMINE PO) Take 1 capsule by mouth daily.     Marland Kitchen glucose blood (ONETOUCH VERIO) test strip Use Onetouch verio test strips as instructed to check blood sugars 4 times daily. DX:E11.65 400 each 1  . hydroxychloroquine (PLAQUENIL) 200 MG tablet Take 1 tablet by mouth twice daily 180 tablet 0  . insulin degludec (TRESIBA FLEXTOUCH) 200 UNIT/ML FlexTouch Pen INJECT 36 UNITS SUBCUTANEOUSLY IN THE MORNING 9 mL 2  . Lancets (ONETOUCH DELICA PLUS [id number]) MISC USE TO CHECK BLOOD SUGAR UP TO THREE TIMES A DAY 2 HOURS AFTER A MEAL AND UP TO 3 TIMES A WEEK UPON WAKING UP 300 each 0  . levothyroxine (SYNTHROID) 137 MCG tablet Take 1 tablet by mouth once daily 90 tablet 0  . linaclotide (LINZESS) 290 MCG CAPS capsule Take 1 capsule (290 mcg total) by mouth daily as needed. 90 capsule 3  . metFORMIN (GLUCOPHAGE) 1000 MG tablet TAKE 1 TABLET BY MOUTH TWICE DAILY WITH A MEAL 180 tablet 0  . methocarbamol (ROBAXIN) 500 MG tablet Take 1 tablet (500 mg total) by mouth every 8 (eight) hours as needed for muscle spasms. 30 tablet 0  . methotrexate (RHEUMATREX) 2.5 MG tablet TAKE 7 TABLETS BY MOUTH ONCE A WEEK (17.5  MG  TOTAL)  PROTECT  FROM  LIGHT 28  tablet 0  . montelukast (SINGULAIR) 10 MG tablet TAKE 1 TABLET BY MOUTH AT BEDTIME 90 tablet 0  . Multiple Vitamin (MULTIVITAMIN WITH MINERALS) TABS tablet Take 1 tablet by mouth daily.    Marland Kitchen NOVOLOG FLEXPEN 100 UNIT/ML FlexPen INJECT 10-15 UNITS SUBCUTANEOUSLY THREE TIMES DAILY BEFORE MEAL(S) 15 mL 1  . potassium chloride (KLOR-CON) 10 MEQ tablet Take 1 tablet by mouth twice daily 180 tablet 0  . rosuvastatin (CRESTOR) 40 MG tablet Take 1 tablet (40 mg total) by mouth daily. 90 tablet 3  . triamcinolone cream (KENALOG) 0.1 % Apply 1 application topically 3 (three) times daily. 100 g 0  . valsartan-hydrochlorothiazide (DIOVAN-HCT) 320-12.5 MG tablet Take 1 tablet by mouth daily. 90 tablet 0  . [DISCONTINUED] Saxagliptin-Metformin  07-998 MG TB24 Take 1 tablet by mouth daily. 30 tablet    No current facility-administered medications on file prior to visit.     Allergies  Allergen Reactions  . Contrast Media [Iodinated Diagnostic Agents]     Throat swells from ct con tra  . Adhesive [Tape]     No paper tape, causes blisters  . Erythromycin Rash  . Penicillins Rash    Did it involve swelling of the face/tongue/throat, SOB, or low BP? No Did it involve sudden or severe rash/hives, skin peeling, or any reaction on the inside of your mouth or nose? No Did you need to seek medical attention at a hospital or doctor's office? No When did it last happen?Many years If all above answers are "NO", may proceed with cephalosporin use.     Objective: [place] is a pleasant 74 y.o. African American female, WD, WN in NAD. AAO x 3.  There were no vitals filed for this visit.  Vascular Examination: Neurovascular status unchanged b/l lower extremities. Capillary fill time to digits <3 seconds b/l lower extremities. Palpable DP pulses b/l. Palpable PT pulses b/l. Pedal hair sparse b/l. Skin temperature gradient within normal limits b/l.  Dermatological Examination: Pedal skin with normal turgor, texture and tone bilaterally. No open wounds bilaterally. No interdigital macerations bilaterally. Toenails 1-5 b/l elongated, discolored, dystrophic, thickened, crumbly with subungual debris and tenderness to dorsal palpation. Hyperkeratotic lesion(s) L hallux, L 5th toe and R hallux.  No erythema, no edema, no drainage, no flocculence. Resolved hyperkeratotic lesion right 5th digit.  Musculoskeletal: Normal muscle strength 5/5 to all lower extremity muscle groups bilaterally. No pain crepitus or joint limitation noted with ROM b/l. Hammertoes noted to the L 5th toe and R 5th toe. Patient ambulates independent of any assistive aids.  Neurological Examination: Protective sensation intact 5/5 intact bilaterally with 10g  monofilament b/l.  Assessment: 1. Pain due to onychomycosis of toenails of both feet   2. Corns and callus   3. Type 2 diabetes mellitus without complication, with long-term current use of insulin ([place])    Plan: -Examined patient. -No new findings. No new orders. -Continue diabetic foot care principles. -Toenails 1-5 b/l were debrided in length and girth with sterile nail nippers and dremel without iatrogenic bleeding.  -Corn(s) L 5th toe and callus(es) L hallux and R hallux were pared utilizing sterile scalpel blade without incident. Total number debrided =3. -Patient to report any pedal injuries to medical professional immediately. -Patient to continue soft, supportive shoe gear daily. -Patient/POA to call should there be question/concern in the interim.  Return in about 3 months (around 03/15/2020) for diabetic nail and callus trim.  Marzetta Board, DPM

## 2019-12-27 ENCOUNTER — Telehealth: Payer: Self-pay

## 2019-12-27 ENCOUNTER — Other Ambulatory Visit: Payer: Self-pay | Admitting: Physician Assistant

## 2019-12-27 NOTE — Progress Notes (Signed)
Office Visit Note  Patient: Marissa Roberts             Date of Birth: 03-Mar-1946           MRN: [id number]             PCP: [name], MD Referring: [name], * Visit Date: 01/10/2020 Occupation: @GUAROCC @  Subjective:  Other (right wrist/hand pain and swelling )   History of Present Illness: [place] is a 74 y.o. female with seropositive rheumatoid arthritis.  She has been on Plaquenil and methotrexate combination.  She states the medication combination is not working for her.  She is also experiencing hair loss which she does not like.  She continues to have pain and discomfort in her bilateral hands and her knee joints.  She gives history of intermittent swelling.  None of the other joints are painful.  Activities of Daily Living:  Patient reports morning stiffness for a few  minutes.   Patient Reports nocturnal pain.  Difficulty dressing/grooming: Denies Difficulty climbing stairs: Reports Difficulty getting out of chair: Reports Difficulty using hands for taps, buttons, cutlery, and/or writing: Denies  Review of Systems  Constitutional: Positive for fatigue.  HENT: Negative for mouth sores, mouth dryness and nose dryness.   Eyes: Negative for pain, itching and dryness.  Respiratory: Negative for shortness of breath and difficulty breathing.   Cardiovascular: Negative for chest pain and palpitations.  Gastrointestinal: Negative for blood in stool, constipation and diarrhea.  Endocrine: Negative for increased urination.  Genitourinary: Negative for difficulty urinating and painful urination.  Musculoskeletal: Positive for arthralgias, joint pain, joint swelling and morning stiffness. Negative for myalgias, muscle tenderness and myalgias.  Skin: Positive for hair loss and redness. Negative for color change and rash.  Allergic/Immunologic: Negative for susceptible to infections.  Neurological: Negative for dizziness, numbness, headaches,  memory loss and weakness.  Hematological: Negative for bruising/bleeding tendency.  Psychiatric/Behavioral: Negative for confusion and sleep disturbance.    PMFS History:  Patient Active Problem List   Diagnosis Date Noted  . Rash 10/11/2019  . Arthritis of left knee 07/01/2019  . B12 deficiency 03/23/2019  . Anemia 02/05/2019  . Rheumatoid arthritis with rheumatoid factor of multiple sites without organ or systems involvement ([place]) 01/25/2019  . High risk medication use 01/25/2019  . Fatigue 09/18/2018  . Routine general medical examination at a health care facility 02/07/2015  . Morbid obesity ([place]) 04/19/2013  . Hyperlipidemia associated with type 2 diabetes mellitus ([place]) 07/28/2009  . Osteopenia 03/14/2009  . Diabetes mellitus type 2 with complications ([place]) 97/35/3299  . Hypothyroidism 09/24/2006  . Essential hypertension 09/24/2006    Past Medical History:  Diagnosis Date  . Arthritis   . Bronchitis    hx of  . Diabetes mellitus   . Family history of adverse reaction to anesthesia    per patient, "daughter threw up after a knee surgery"  . GERD (gastroesophageal reflux disease)   . Hypercholesteremia   . Hypertension   . Hypothyroidism   . Pneumonia    hx of  . PONV (postoperative nausea and vomiting)   . Thyroid disease     Family History  Problem Relation Age of Onset  . Diabetes Mother   . Heart disease Mother   . Hyperlipidemia Mother   . Diabetes Father   . Diabetes Sister   . Hypertension Sister   . Heart disease Sister   . Dementia Sister   . Heart disease Brother   .  Healthy Daughter   . Healthy Daughter   . Healthy Daughter   . Healthy Son    Past Surgical History:  Procedure Laterality Date  . ABDOMINAL HYSTERECTOMY     partial  . BREAST SURGERY     lumpectomy left breast  . carpel tunnel    . COLONOSCOPY W/ POLYPECTOMY    . FOOT SURGERY    . knee arhtroscopy    . KNEE ARTHROPLASTY    . SHOULDER ARTHROSCOPY WITH SUBACROMIAL  DECOMPRESSION, ROTATOR CUFF REPAIR AND BICEP TENDON REPAIR Right 05/26/2012   Procedure: Right Shoulder Diagnostic Operative Arthroscopy, Subacromial Decompression, Biceps Tenodesis, Mini Open Rotator Cuff Repair;  Surgeon: [name], MD;  Location: [facility];  Service: Orthopedics;  Laterality: Right;  Right Shoulder Diagnostic Operative Arthroscopy, Subacromial Decompression, Biceps Tenodesis, Mini Open Rotator Cuff Repair  . TONSILLECTOMY    . TOTAL KNEE ARTHROPLASTY Left 07/01/2019   Procedure: LEFT TOTAL KNEE ARTHROPLASTY;  Surgeon: [name], MD;  Location: [facility];  Service: Orthopedics;  Laterality: Left;   Social History   Social History Narrative  . Not on file   Immunization History  Administered Date(s) Administered  . Fluad Quad(high Dose 65+) 01/02/2019  . Influenza Split 01/03/2011  . Influenza Whole 04/01/2005, 12/18/2009  . Influenza, High Dose Seasonal PF 01/07/2014, 01/27/2015, 01/02/2016, 01/13/2017, 01/16/2018  . Influenza-Unspecified 11/30/2012  . PFIZER SARS-COV-2 Vaccination 05/28/2019, 06/23/2019  . Pneumococcal Conjugate-13 09/12/2016, 01/02/2019  . Pneumococcal Polysaccharide-23 07/15/2014, 12/27/2019  . Td 04/01/2001, 10/07/2013     Objective: Vital Signs: BP (!) 146/82 (BP Location: Left Arm, Patient Position: Sitting, Cuff Size: Normal)   Pulse 68   Resp 16   Ht 5\' 5"  (1.651 m)   Wt 237 lb 12.8 oz (107.9 kg)   BMI 39.57 kg/m    Physical Exam Vitals and nursing note reviewed.  Constitutional:      Appearance: She is well-developed.  HENT:     Head: Normocephalic and atraumatic.  Eyes:     Conjunctiva/sclera: Conjunctivae normal.  Cardiovascular:     Rate and Rhythm: Normal rate and regular rhythm.     Heart sounds: Normal heart sounds.  Pulmonary:     Effort: Pulmonary effort is normal.     Breath sounds: Normal breath sounds.  Abdominal:     General: Bowel sounds are normal.     Palpations: Abdomen is soft.  Musculoskeletal:      Cervical back: Normal range of motion.  Lymphadenopathy:     Cervical: No cervical adenopathy.  Skin:    General: Skin is warm and dry.     Capillary Refill: Capillary refill takes less than 2 seconds.  Neurological:     Mental Status: She is alert and oriented to person, place, and time.  Psychiatric:        Behavior: Behavior normal.      Musculoskeletal Exam: C-spine was in good range of motion.  Shoulder joints, elbow joints, wrist joints with good range of motion.  She has right extensor tenosynovitis.  There was no tenderness over her MCPs PIPs or DIPs.  Hip joints with good range of motion.  She has crepitus and discomfort in her bilateral knee joints without any warmth swelling or effusion.  CDAI Exam: CDAI Score: 4.4  Patient Global: 2 mm; Provider Global: 2 mm Swollen: 1 ; Tender: 3  Joint Exam 01/10/2020      Right  Left  Wrist  Swollen Tender     Knee   Tender  Tender     Investigation: No additional findings.  Imaging: No results found.  Recent Labs: Lab Results  Component Value Date   WBC 12.3 (H) 11/24/2019   HGB 11.4 (L) 11/24/2019   PLT 279 11/24/2019   NA 141 11/25/2019   K 3.9 11/25/2019   CL 104 11/25/2019   CO2 30 11/25/2019   GLUCOSE 79 11/25/2019   BUN 11 11/25/2019   CREATININE 0.79 11/25/2019   BILITOT 0.3 11/25/2019   ALKPHOS 45 11/25/2019   AST 17 11/25/2019   ALT 17 11/25/2019   PROT 7.0 11/25/2019   ALBUMIN 4.0 11/25/2019   CALCIUM 9.9 11/25/2019   GFRAA 92 11/24/2019   QFTBGOLDPLUS NEGATIVE 12/24/2018    Speciality Comments: PLQ Eye Exam: 12/22/2019 WNL @ [facility] follow up in 6 months  Methotrexate-hair loss  Procedures:  No procedures performed Allergies: Contrast media [iodinated diagnostic agents], Adhesive [tape], Erythromycin, and Penicillins   Assessment / Plan:     Visit Diagnoses: Rheumatoid arthritis involving multiple sites with positive rheumatoid factor ([place]) -she continues to have pain and  discomfort in her bilateral knee joints.  No warmth swelling effusion was noted.  I believe is due to underlying osteoarthritis.  She had right wrist joint extensor tenosynovitis.  She wants to discontinue methotrexate as she continues to have hair loss.  Different treatment options and their side effects were discussed.  I discussed the option of either switching her to leflunomide or switching her to anti-TNF's.  She did not like the side effects of anti-TNF's.  We will try leflunomide.  Indications Elvis contraindications were discussed.  A handout was given and consent was taken.  She will be starting on leflunomide 20 mg p.o. daily and check labs in 2 weeks, 2 months and then every 3 months to monitor for drug toxicity.  She will continue hydroxychloroquine.  Plan: hydroxychloroquine (PLAQUENIL) 200 MG tablet  Medication counseling:   Baseline Immunosuppressant Therapy Labs  Quantiferon TB Gold Latest Ref Rng & Units 12/24/2018  Quantiferon TB Gold Plus NEGATIVE NEGATIVE    Hepatitis Latest Ref Rng & Units 12/24/2018  Hep B Surface Ag NON-REACTI NON-REACTIVE  Hep B IgM NON-REACTI NON-REACTIVE  Hep C Ab NON-REACTI NON-REACTIVE  Hep C Ab NON-REACTI NON-REACTIVE    Lab Results  Component Value Date   HIV NON-REACTIVE 12/24/2018    Immunoglobulin Electrophoresis Latest Ref Rng & Units 12/24/2018  IgA  70 - 320 mg/dL 260  IgG 600 - 1,540 mg/dL 859  IgM 50 - 300 mg/dL 175    Serum Protein Electrophoresis Latest Ref Rng & Units 11/25/2019  Total Protein 6.0 - 8.3 g/dL 7.0  Albumin 3.8 - 4.8 g/dL -  Alpha-1 0.2 - 0.3 g/dL -  Alpha-2 0.5 - 0.9 g/dL -  Beta Globulin 0.4 - 0.6 g/dL -  Beta 2 0.2 - 0.5 g/dL -  Gamma Globulin 0.8 - 1.7 g/dL -    Lab Results  Component Value Date   G6PDH 16.5 12/24/2018    No results found for: TPMT   Patient was counseled on the purpose, proper use, and adverse effects of leflunomide including risk of infection, nausea/diarrhea/weight loss, increase  in blood pressure, rash, hair loss, tingling in the hands and feet, and signs and symptoms of interstitial lung disease.   Also counseled on Black Box warning of liver injury and importance of avoiding alcohol while on therapy. Discussed that there is the possibility of an increased risk of malignancy but it is not well  understood if this increased risk is due to the medication or the disease state.  Counseled patient to avoid live vaccines. Recommend annual influenza, Pneumovax 23, Prevnar 13, and Shingrix as indicated.   Discussed the importance of frequent monitoring of liver function and blood count.  Standing orders placed.   Provided patient with educational materials on leflunomide and answered all questions.  Patient consented to [place] use, and consent will be uploaded into the media tab.   Patient dose will be 20 mg p.o. daily.   High risk medication use - Plaquenil 200 mg 1 tablet by mouth twice daily, MTX 7 tablets po once weekly, and folic acid 2 mg po daily.  Her eye examination December 22, 2019 was normal.  Primary osteoarthritis of both feet-proper fitting shoes were discussed.  S/P total knee replacement, left - Performed by Dr. Marlou Sa on 07/01/19.  Osteopenia, unspecified location-she is on calcium and vitamin D.  Essential hypertension-her blood pressure was elevated today.  Have advised her to monitor blood pressure closely.  Hyperlipidemia associated with type 2 diabetes mellitus ([place])  History of hypothyroidism  Diabetes mellitus type 2 with complications ([place])  Educated about COVID-19 virus infection-she is fully vaccinated against COVID-19.  She has been advised to get a booster.  Use of mask, social distancing and hand hygiene was discussed.  Use of monoclonal antibodies were also discussed in case she develops COVID-19 infection.  Orders: No orders of the defined types were placed in this encounter.  Meds ordered this encounter  Medications  . hydroxychloroquine  (PLAQUENIL) 200 MG tablet    Sig: Take 1 tablet (200 mg total) by mouth 2 (two) times daily.    Dispense:  180 tablet    Refill:  0  . leflunomide (ARAVA) 20 MG tablet    Sig: Take 1 tablet (20 mg total) by mouth daily.    Dispense:  90 tablet    Refill:  0    Follow-Up Instructions: Return in about 6 weeks (around 02/21/2020) for Rheumatoid arthritis.   Bo Merino, MD  Note - This record has been created using Editor, commissioning.  Chart creation errors have been sought, but may not always  have been located. Such creation errors do not reflect on  the standard of medical care.

## 2019-12-27 NOTE — Telephone Encounter (Signed)
Patient called requesting prescription refill of Methotrexate to be sent to [facility] on [address].  Patient states she only had 5 pills remaining to take this week.  Patient states her hands have become more painful and is not sure if it is the change of weather or that the medication isn't working as well.  Patient requested a return call to discuss if she needs to take the 2 pills when the prescription comes in.

## 2019-12-27 NOTE — Telephone Encounter (Signed)
Attempted to contact the patient and left message for patient to call the office.  

## 2019-12-27 NOTE — Telephone Encounter (Signed)
Last Visit: 08/09/2019 Next Visit: 01/10/2020 Labs: 11/24/2019 WBC count is mildly elevated. Per patient she has not had any recent infections.  Hgb and Hct are low but improving.  CMP WNL.   Current Dose per office note : methotrexate 7 tablets by mouth once weekly DX: Rheumatoid arthritis involving multiple sites with positive rheumatoid factor   Okay to refill MTX?

## 2019-12-28 ENCOUNTER — Telehealth: Payer: Self-pay | Admitting: Rheumatology

## 2019-12-28 NOTE — Telephone Encounter (Signed)
Please schedule a sooner office visit appointment for further evaluation.  She was clinically doing well at her office visit in May 2021 and did not having any synovitis or recent flares at that time.   If her finger is locking she may have a trigger finger instead of a RA flare.

## 2019-12-28 NOTE — Telephone Encounter (Signed)
Patient was returning your call. Patient needs to ask you about her hand pain also. She will be in and out with her husband at appointments today, but requests you continue to call back if she does not answer the first time.

## 2019-12-28 NOTE — Telephone Encounter (Signed)
Attempted to call patient, left voicemail on home and cell asking patient to call the office.

## 2019-12-28 NOTE — Telephone Encounter (Signed)
Attempted to contact the patient and left message for patient to call the office.  

## 2019-12-28 NOTE — Telephone Encounter (Signed)
Patient states she only had 5 tabs of MTX to take on Saturday. Patient states she took the other 2 tabs when she picked up her prescription on Monday. Patient states she is having trouble with increased pain in her right hand. Patient states her left hand had trouble with her 5th finger locking. Patient states she has not had any injury to her hand.Paitent states she is having ankle and leg swelling.Patient is on MTX 7 tabs weekly and PLQ 200 mg twice daily. Patient states she has not missed any doses. Please advise.

## 2019-12-28 NOTE — Telephone Encounter (Signed)
Attempted to contact patient, left voicemail asking patient to call the office.  

## 2019-12-29 NOTE — Telephone Encounter (Signed)
Spoke with patient, attempted to schedule a sooner appointment and possibly schedule her for a trigger finger injection, but because of patient's limited availability she is going to keep her originally scheduled appointment on 01/10/2020.

## 2020-01-04 ENCOUNTER — Telehealth: Payer: Self-pay | Admitting: Endocrinology

## 2020-01-04 ENCOUNTER — Telehealth: Payer: Self-pay | Admitting: Internal Medicine

## 2020-01-04 NOTE — Telephone Encounter (Signed)
In general, the medication needs to be taken on an empty stomach 30 minutes before eating or drinking.  I think it would be appropriate for her to see her endocrinologist about this as well and see if he can offer any insights for her.

## 2020-01-04 NOTE — Telephone Encounter (Signed)
Patient states that she will try to eat something with the [facility] and see if this help with the nausea she is having. If that doesn't help she will contact us back to see if something can be given . Patient likes the medication and doesn't want to come off of it

## 2020-01-04 NOTE — Telephone Encounter (Signed)
Pt has been informed and expressed understanding.  

## 2020-01-04 NOTE — Telephone Encounter (Signed)
Patient was asking if someone could call her back regarding her question about eating with certain medications? Ph# [phone]

## 2020-01-04 NOTE — Telephone Encounter (Signed)
    Patient seeking advice on taking levothyroxine (SYNTHROID) 137 MCG tablet  Patient reports nausea and vomiting. She takes the medication when she first wakes up in the morning on an empty stomach. Can medication be taken with food?

## 2020-01-10 ENCOUNTER — Ambulatory Visit (INDEPENDENT_AMBULATORY_CARE_PROVIDER_SITE_OTHER): Payer: Medicare Other | Admitting: Rheumatology

## 2020-01-10 ENCOUNTER — Other Ambulatory Visit: Payer: Self-pay

## 2020-01-10 ENCOUNTER — Encounter: Payer: Self-pay | Admitting: Rheumatology

## 2020-01-10 VITALS — BP 146/82 | HR 68 | Resp 16 | Ht 65.0 in | Wt 237.8 lb

## 2020-01-10 DIAGNOSIS — Z96652 Presence of left artificial knee joint: Secondary | ICD-10-CM

## 2020-01-10 DIAGNOSIS — Z8639 Personal history of other endocrine, nutritional and metabolic disease: Secondary | ICD-10-CM

## 2020-01-10 DIAGNOSIS — M19072 Primary osteoarthritis, left ankle and foot: Secondary | ICD-10-CM

## 2020-01-10 DIAGNOSIS — I10 Essential (primary) hypertension: Secondary | ICD-10-CM

## 2020-01-10 DIAGNOSIS — E118 Type 2 diabetes mellitus with unspecified complications: Secondary | ICD-10-CM

## 2020-01-10 DIAGNOSIS — M0579 Rheumatoid arthritis with rheumatoid factor of multiple sites without organ or systems involvement: Secondary | ICD-10-CM

## 2020-01-10 DIAGNOSIS — E785 Hyperlipidemia, unspecified: Secondary | ICD-10-CM

## 2020-01-10 DIAGNOSIS — M19071 Primary osteoarthritis, right ankle and foot: Secondary | ICD-10-CM

## 2020-01-10 DIAGNOSIS — Z79899 Other long term (current) drug therapy: Secondary | ICD-10-CM | POA: Diagnosis not present

## 2020-01-10 DIAGNOSIS — E1169 Type 2 diabetes mellitus with other specified complication: Secondary | ICD-10-CM

## 2020-01-10 DIAGNOSIS — Z7189 Other specified counseling: Secondary | ICD-10-CM

## 2020-01-10 DIAGNOSIS — M8580 Other specified disorders of bone density and structure, unspecified site: Secondary | ICD-10-CM

## 2020-01-10 MED ORDER — LEFLUNOMIDE 20 MG PO TABS: 20.0000 mg | ORAL_TABLET | Freq: Every day | ORAL | 0 refills | Status: DC

## 2020-01-10 MED ORDER — HYDROXYCHLOROQUINE SULFATE 200 MG PO TABS: 200.0000 mg | ORAL_TABLET | Freq: Two times a day (BID) | ORAL | 0 refills | Status: DC

## 2020-01-10 NOTE — Patient Instructions (Addendum)
Discontinue methotrexate  Start leflunomide 20 mg by mouth daily  Return in 2 weeks to get labs then 2 months and then every 3 months.  Continue hydroxychloroquine  Standing Labs We placed an order today for your standing lab work.   Please have your standing labs drawn in 2 weeks, 2 months and then every 3 months  If possible, please have your labs drawn 2 weeks prior to your appointment so that the provider can discuss your results at your appointment.  We have open lab daily Monday through Thursday from 8:30-12:30 PM and 1:30-4:30 PM and Friday from 8:30-12:30 PM and 1:30-4:00 PM at the office of Dr. Bo Merino, [facility].   Please be advised, patients with office appointments requiring lab work will take precedents over walk-in lab work.  If possible, please come for your lab work on Monday and Friday afternoons, as you may experience shorter wait times. The office is located at [address] [place], [place], [place] [address] No appointment is necessary.   Labs are drawn by [facility]. Please bring your co-pay at the time of your lab draw.  You may receive a bill from [facility] for your lab work.  If you wish to have your labs drawn at another location, please call the office 24 hours in advance to send orders.  If you have any questions regarding directions or hours of operation,  please call [phone].   As a reminder, please drink plenty of water prior to coming for your lab work. Thanks!     Leflunomide tablets What is this medicine? LEFLUNOMIDE (le FLOO na mide) is for rheumatoid arthritis. This medicine may be used for other purposes; ask your health care provider or pharmacist if you have questions. COMMON BRAND NAME(S): Arava What should I tell my health care provider before I take this medicine? They need to know if you have any of these conditions:  diabetes  have a fever or infection  high blood pressure  immune system  problems  kidney disease  liver disease  low blood cell counts, like low white cell, platelet, or red cell counts  lung or breathing disease, like asthma  recently received or scheduled to receive a vaccine  receiving treatment for cancer  skin conditions or sensitivity  tingling of the fingers or toes, or other nerve disorder  tuberculosis  an unusual or allergic reaction to leflunomide, teriflunomide, other medicines, food, dyes, or preservatives  pregnant or trying to get pregnant  breast-feeding How should I use this medicine? Take this medicine by mouth with a full glass of water. Follow the directions on the prescription label. Take your medicine at regular intervals. Do not take your medicine more often than directed. Do not stop taking except on your doctor's advice. Talk to your pediatrician regarding the use of this medicine in children. Special care may be needed. Overdosage: If you think you have taken too much of this medicine contact a poison control center or emergency room at once. NOTE: This medicine is only for you. Do not share this medicine with others. What if I miss a dose? If you miss a dose, take it as soon as you can. If it is almost time for your next dose, take only that dose. Do not take double or extra doses. What may interact with this medicine? Do not take this medicine with any of the following medications:  teriflunomide This medicine may also interact with the following medications:  alosetron  birth control  pills  caffeine  cefaclor  certain medicines for diabetes like nateglinide, repaglinide, rosiglitazone, pioglitazone  certain medicines for high cholesterol like atorvastatin, pravastatin, rosuvastatin, simvastatin  charcoal  cholestyramine  ciprofloxacin  duloxetine  furosemide  ketoprofen  live virus vaccines  medicines that increase your risk for  infection  methotrexate  mitoxantrone  paclitaxel  penicillin  theophylline  tizanidine  warfarin This list may not describe all possible interactions. Give your health care provider a list of all the medicines, herbs, non-prescription drugs, or dietary supplements you use. Also tell them if you smoke, drink alcohol, or use illegal drugs. Some items may interact with your medicine. What should I watch for while using this medicine? Visit your health care provider for regular checks on your progress. Tell your doctor or health care provider if your symptoms do not start to get better or if they get worse. You may need blood work done while you are taking this medicine. This medicine may cause serious skin reactions. They can happen weeks to months after starting the medicine. Contact your health care provider right away if you notice fevers or flu-like symptoms with a rash. The rash may be red or purple and then turn into blisters or peeling of the skin. Or, you might notice a red rash with swelling of the face, lips or lymph nodes in your neck or under your arms. This medicine may stay in your body for up to 2 years after your last dose. Tell your doctor about any unusual side effects or symptoms. A medicine can be given to help lower your blood levels of this medicine more quickly. Women must use effective birth control with this medicine. There is a potential for serious side effects to an unborn child. Do not become pregnant while taking this medicine. Inform your doctor if you wish to become pregnant. This medicine remains in your blood after you stop taking it. You must continue using effective birth control until the blood levels have been checked and they are low enough. A medicine can be given to help lower your blood levels of this medicine more quickly. Immediately talk to your doctor if you think you may be pregnant. You may need a pregnancy test. Talk to your health care provider or  pharmacist for more information. You should not receive certain vaccines during your treatment and for a certain time after your treatment with this medication ends. Talk to your health care provider for more information. What side effects may I notice from receiving this medicine? Side effects that you should report to your doctor or health care professional as soon as possible:  allergic reactions like skin rash, itching or hives, swelling of the face, lips, or tongue  breathing problems  cough  increased blood pressure  low blood counts - this medicine may decrease the number of white blood cells and platelets. You may be at increased risk for infections and bleeding.  pain, tingling, numbness in the hands or feet  rash, fever, and swollen lymph nodes  redness, blistering, peeing or loosening of the skin, including inside the mouth  signs of decreased platelets or bleeding - bruising, pinpoint red spots on the skin, black, tarry stools, blood in urine  signs of infection - fever or chills, cough, sore throat, pain or trouble passing urine  signs and symptoms of liver injury like dark yellow or brown urine; general ill feeling or flu-like symptoms; light-colored stools; loss of appetite; nausea; right upper belly pain; unusually weak  or tired; yellowing of the eyes or skin  trouble passing urine or change in the amount of urine  vomiting Side effects that usually do not require medical attention (report to your doctor or health care professional if they continue or are bothersome):  diarrhea  hair thinning or loss  headache  nausea  tiredness This list may not describe all possible side effects. Call your doctor for medical advice about side effects. You may report side effects to FDA at 1-800-FDA-1088. Where should I keep my medicine? Keep out of the reach of children. Store at room temperature between 15 and 30 degrees C (59 and 86 degrees F). Protect from moisture and  light. Throw away any unused medicine after the expiration date. NOTE: This sheet is a summary. It may not cover all possible information. If you have questions about this medicine, talk to your doctor, pharmacist, or health care provider.  2020 Elsevier/Gold Standard (2018-06-19 15:06:48)  COVID-19 vaccine recommendations:   COVID-19 vaccine is recommended for everyone (unless you are allergic to a vaccine component), even if you are on a medication that suppresses your immune system.   If you are on Methotrexate, Cellcept (mycophenolate), Rinvoq, Morrie Sheldon, and Olumiant- hold the medication for 1 week after each vaccine. Hold Methotrexate for 2 weeks after the single dose COVID-19 vaccine.   Do not take Tylenol or any anti-inflammatory medications (NSAIDs) 24 hours prior to the COVID-19 vaccination.   There is no direct evidence about the efficacy of the COVID-19 vaccine in individuals who are on medications that suppress the immune system.   Even if you are fully vaccinated, and you are on any medications that suppress your immune system, please continue to wear a mask, maintain at least six feet social distance and practice hand hygiene.   If you develop a COVID-19 infection, please contact your PCP or our office to determine if you need antibody infusion.  The booster vaccine is now available for immunocompromised patients. It is advised that if you had Pfizer vaccine you should get Coca[employer]a booster.  If you had a Moderna vaccine then you should get a Moderna booster. Johnson and Wynetta Emery does not have a booster vaccine at this time.  Please see the following web sites for updated information.   [email or web address]://[email or web address]/[email or web address]-Vaccination-Patient-Resources.[email or web address]  [email or web address]/About-[email or web address]-Releases/ID/1159

## 2020-01-12 ENCOUNTER — Other Ambulatory Visit: Payer: Self-pay | Admitting: Endocrinology

## 2020-01-14 ENCOUNTER — Ambulatory Visit: Payer: Medicare Other | Admitting: Surgical

## 2020-01-17 ENCOUNTER — Ambulatory Visit: Payer: Medicare Other | Admitting: Orthopedic Surgery

## 2020-01-17 ENCOUNTER — Ambulatory Visit (INDEPENDENT_AMBULATORY_CARE_PROVIDER_SITE_OTHER): Payer: Medicare Other | Admitting: Orthopedic Surgery

## 2020-01-17 ENCOUNTER — Ambulatory Visit: Payer: Self-pay

## 2020-01-17 ENCOUNTER — Other Ambulatory Visit: Payer: Self-pay

## 2020-01-17 DIAGNOSIS — Z96652 Presence of left artificial knee joint: Secondary | ICD-10-CM | POA: Diagnosis not present

## 2020-01-22 ENCOUNTER — Encounter: Payer: Self-pay | Admitting: Orthopedic Surgery

## 2020-01-22 NOTE — Progress Notes (Signed)
Office Visit Note   Patient: Marissa Roberts           Date of Birth: 17-Mar-1946           MRN: [id number] Visit Date: 01/17/2020 Requested by: [name], MD [address] [place],  [place] [address] PCP: [name], MD  Subjective: Chief Complaint  Patient presents with  . Left Knee - Pain    HPI: Marissa Roberts is a 74 y.o. female who presents to the office complaining of left knee pain.  She had left total knee arthroplasty on 07/01/2019.  She notes anterior left knee pain over the last 2 to 3 weeks without any injury.  It has slowly been worsening.  She notes it is difficult to get in and out of a car.  She denies any fevers, chills, night sweats, malaise.  Denies any falls.  No groin pain or low back pain with radicular component.  She was previously doing very well with her total knee..                ROS: All systems reviewed are negative as they relate to the chief complaint within the history of present illness.  Patient denies fevers or chills.  Assessment & Plan: Visit Diagnoses:  1. Status post total left knee replacement     Plan: Patient is a 74 year old female presents s/p left total knee arthroplasty on 07/01/2019.  She was doing very well up until the last 3 weeks when she began to have worsening left knee pain in the anterior aspect of her knee.  She denies any injury leading to onset of pain.  Denies any systemic symptoms suggestive of infection.  Radiographs of the left knee taken today show a small avulsion fracture of the inferior pole of the patella on the lateral view.  On exam she is tender over this location.  She is able to perform straight leg raise and ultrasound shows no tenderness injury of the patellar tendon aside from the small avulsion.  Plan for patient to use a knee immobilizer for 3 weeks.  She may weight-bear as tolerated as long as she is in the knee immobilizer.  Follow-up in 4 weeks for clinical recheck with lateral x-ray  at that time.  Patient agreed with plan.  Follow-Up Instructions: No follow-ups on file.   Orders:  Orders Placed This Encounter  Procedures  . XR Knee 1-2 Views Left   No orders of the defined types were placed in this encounter.     Procedures: No procedures performed   Clinical Data: No additional findings.  Objective: Vital Signs: There were no vitals taken for this visit.  Physical Exam:  Constitutional: Patient appears well-developed HEENT:  Head: Normocephalic Eyes:EOM are normal Neck: Normal range of motion Cardiovascular: Normal rate Pulmonary/chest: Effort normal Neurologic: Patient is alert Skin: Skin is warm Psychiatric: Patient has normal mood and affect  Ortho Exam: Ortho exam demonstrates left knee with 0 degrees extension, greater than 100 degrees of flexion.  No effusion.  No significant warmth compared to the contralateral knee.  No tenderness throughout the calf.  Negative Homans' sign.  She is able to perform straight leg raise easily without any extensor lag.  Tenderness to palpation over the inferior pole of the patella as well as the patellar tendon.  Specialty Comments:  No specialty comments available.  Imaging: No results found.   PMFS History: Patient Active Problem List   Diagnosis Date Noted  .  Rash 10/11/2019  . Arthritis of left knee 07/01/2019  . B12 deficiency 03/23/2019  . Anemia 02/05/2019  . Rheumatoid arthritis with rheumatoid factor of multiple sites without organ or systems involvement ([facility]) 01/25/2019  . High risk medication use 01/25/2019  . Fatigue 09/18/2018  . Routine general medical examination at a health care facility 02/07/2015  . Morbid obesity ([facility]) 04/19/2013  . Hyperlipidemia associated with type 2 diabetes mellitus ([facility]) 07/28/2009  . Osteopenia 03/14/2009  . Diabetes mellitus type 2 with complications ([facility]) 66/08/3014  . Hypothyroidism 09/24/2006  . Essential hypertension 09/24/2006   Past Medical  History:  Diagnosis Date  . Arthritis   . Bronchitis    hx of  . Diabetes mellitus   . Family history of adverse reaction to anesthesia    per patient, "daughter threw up after a knee surgery"  . GERD (gastroesophageal reflux disease)   . Hypercholesteremia   . Hypertension   . Hypothyroidism   . Pneumonia    hx of  . PONV (postoperative nausea and vomiting)   . Thyroid disease     Family History  Problem Relation Age of Onset  . Diabetes Mother   . Heart disease Mother   . Hyperlipidemia Mother   . Diabetes Father   . Diabetes Sister   . Hypertension Sister   . Heart disease Sister   . Dementia Sister   . Heart disease Brother   . Healthy Daughter   . Healthy Daughter   . Healthy Daughter   . Healthy Son     Past Surgical History:  Procedure Laterality Date  . ABDOMINAL HYSTERECTOMY     partial  . BREAST SURGERY     lumpectomy left breast  . carpel tunnel    . COLONOSCOPY W/ POLYPECTOMY    . FOOT SURGERY    . knee arhtroscopy    . KNEE ARTHROPLASTY    . SHOULDER ARTHROSCOPY WITH SUBACROMIAL DECOMPRESSION, ROTATOR CUFF REPAIR AND BICEP TENDON REPAIR Right 05/26/2012   Procedure: Right Shoulder Diagnostic Operative Arthroscopy, Subacromial Decompression, Biceps Tenodesis, Mini Open Rotator Cuff Repair;  Surgeon: [name], MD;  Location: [facility];  Service: Orthopedics;  Laterality: Right;  Right Shoulder Diagnostic Operative Arthroscopy, Subacromial Decompression, Biceps Tenodesis, Mini Open Rotator Cuff Repair  . TONSILLECTOMY    . TOTAL KNEE ARTHROPLASTY Left 07/01/2019   Procedure: LEFT TOTAL KNEE ARTHROPLASTY;  Surgeon: [name], MD;  Location: [facility];  Service: Orthopedics;  Laterality: Left;   Social History   Occupational History  . Not on file  Tobacco Use  . Smoking status: Former Smoker    Quit date: 04/01/1990    Years since quitting: 29.8  . Smokeless tobacco: Never Used  Vaping Use  . Vaping Use: Never used  Substance and Sexual  Activity  . Alcohol use: Not Currently    Comment: 06/22/2019: per patient about 20-25 years ago  . Drug use: No  . Sexual activity: Never    Birth control/protection: Abstinence

## 2020-01-24 ENCOUNTER — Other Ambulatory Visit: Payer: Self-pay | Admitting: *Deleted

## 2020-01-24 DIAGNOSIS — Z79899 Other long term (current) drug therapy: Secondary | ICD-10-CM

## 2020-01-25 ENCOUNTER — Encounter: Payer: Self-pay | Admitting: Endocrinology

## 2020-01-25 LAB — COMPLETE METABOLIC PANEL WITH GFR
AG Ratio: 1.5 (calc) (ref 1.0–2.5)
ALT: 16 U/L (ref 6–29)
AST: 17 U/L (ref 10–35)
Albumin: 4 g/dL (ref 3.6–5.1)
Alkaline phosphatase (APISO): 51 U/L (ref 37–153)
BUN: 9 mg/dL (ref 7–25)
CO2: 27 mmol/L (ref 20–32)
Calcium: 10 mg/dL (ref 8.6–10.4)
Chloride: 105 mmol/L (ref 98–110)
Creat: 0.67 mg/dL (ref 0.60–0.93)
GFR, Est African American: 102 mL/min/{1.73_m2} (ref >=60)
GFR, Est Non African American: 88 mL/min/{1.73_m2} (ref >=60)
Globulin: 2.7 g/dL (calc) (ref 1.9–3.7)
Glucose, Bld: 104 mg/dL — ABNORMAL HIGH (ref 65–99)
Potassium: 3.9 mmol/L (ref 3.5–5.3)
Sodium: 141 mmol/L (ref 135–146)
Total Bilirubin: 0.3 mg/dL (ref 0.2–1.2)
Total Protein: 6.7 g/dL (ref 6.1–8.1)

## 2020-01-25 LAB — CBC WITH DIFFERENTIAL/PLATELET
Absolute Monocytes: 832 cells/uL (ref 200–950)
Basophils Absolute: 65 cells/uL (ref 0–200)
Basophils Relative: 0.6 %
Eosinophils Absolute: 270 cells/uL (ref 15–500)
Eosinophils Relative: 2.5 %
HCT: 36 % (ref 35.0–45.0)
Hemoglobin: 12.1 g/dL (ref 11.7–15.5)
Lymphs Abs: 3445 cells/uL (ref 850–3900)
MCH: 29.2 pg (ref 27.0–33.0)
MCHC: 33.6 g/dL (ref 32.0–36.0)
MCV: 86.7 fL (ref 80.0–100.0)
MPV: 11.8 fL (ref 7.5–12.5)
Monocytes Relative: 7.7 %
Neutro Abs: 6188 cells/uL (ref 1500–7800)
Neutrophils Relative %: 57.3 %
Platelets: 270 10*3/uL (ref 140–400)
RBC: 4.15 10*6/uL (ref 3.80–5.10)
RDW: 15.4 % — ABNORMAL HIGH (ref 11.0–15.0)
Total Lymphocyte: 31.9 %
WBC: 10.8 10*3/uL (ref 3.8–10.8)

## 2020-01-25 NOTE — Progress Notes (Signed)
Glucose is 104. Rest of CMP WNL. CBC WNL.

## 2020-02-07 NOTE — Progress Notes (Signed)
Office Visit Note  Patient: Marissa Roberts             Date of Birth: 04-Jun-1945           MRN: [id number]             PCP: [name], MD Referring: [name], * Visit Date: 02/21/2020 Occupation: @GUAROCC @  Subjective:  right wrist and hand pain  History of Present Illness: [place] is a 74 y.o. female with history of seropositive rheumatoid arthritis.  She is taking plaquenil 200 mg 1 tablet by mouth twice daily and arava 20 mg daily. She has been taking arava for about 6 weeks but has not noticed any clinical improvement since starting. She presents today with ongoing pain in the right wrist and right hand.  She has been taking tylenol and using aspercreme as needed for pain relief.  She continues to experience nocturnal pain and has to have her husband assist with ADLs.    Activities of Daily Living:  Patient reports morning stiffness for  0 minutes.   Patient Reports nocturnal pain.  Difficulty dressing/grooming: Denies Difficulty climbing stairs: Denies Difficulty getting out of chair: Denies Difficulty using hands for taps, buttons, cutlery, and/or writing: Denies  Review of Systems  Constitutional: Negative for fatigue.  HENT: Negative for mouth sores, mouth dryness, nose dryness and sore tongue.   Eyes: Negative for pain, visual disturbance and dryness.  Respiratory: Negative for cough, hemoptysis, shortness of breath and difficulty breathing.   Cardiovascular: Negative for chest pain, palpitations, hypertension and swelling in legs/feet.  Gastrointestinal: Negative for blood in stool, constipation and diarrhea.  Endocrine: Negative for increased urination.  Genitourinary: Negative for painful urination.  Musculoskeletal: Positive for arthralgias, joint pain, joint swelling and muscle weakness. Negative for myalgias, morning stiffness, muscle tenderness and myalgias.  Skin: Negative for color change, pallor, rash, hair loss,  nodules/bumps, skin tightness, ulcers and sensitivity to sunlight.  Allergic/Immunologic: Negative for susceptible to infections.  Neurological: Negative for dizziness, numbness, headaches and weakness.  Hematological: Negative for swollen glands.  Psychiatric/Behavioral: Positive for sleep disturbance. Negative for depressed mood. The patient is not nervous/anxious.     PMFS History:  Patient Active Problem List   Diagnosis Date Noted  . Rash 10/11/2019  . Arthritis of left knee 07/01/2019  . B12 deficiency 03/23/2019  . Anemia 02/05/2019  . Rheumatoid arthritis with rheumatoid factor of multiple sites without organ or systems involvement ([place]) 01/25/2019  . High risk medication use 01/25/2019  . Fatigue 09/18/2018  . Routine general medical examination at a health care facility 02/07/2015  . Morbid obesity ([place]) 04/19/2013  . Hyperlipidemia associated with type 2 diabetes mellitus ([place]) 07/28/2009  . Osteopenia 03/14/2009  . Diabetes mellitus type 2 with complications ([place]) 48/25/0037  . Hypothyroidism 09/24/2006  . Essential hypertension 09/24/2006    Past Medical History:  Diagnosis Date  . Arthritis   . Bronchitis    hx of  . Diabetes mellitus   . Family history of adverse reaction to anesthesia    per patient, "daughter threw up after a knee surgery"  . GERD (gastroesophageal reflux disease)   . Hypercholesteremia   . Hypertension   . Hypothyroidism   . Pneumonia    hx of  . PONV (postoperative nausea and vomiting)   . Thyroid disease     Family History  Problem Relation Age of Onset  . Diabetes Mother   . Heart disease Mother   . Hyperlipidemia  Mother   . Diabetes Father   . Diabetes Sister   . Hypertension Sister   . Heart disease Sister   . Dementia Sister   . Heart disease Brother   . Healthy Daughter   . Healthy Daughter   . Healthy Daughter   . Healthy Son    Past Surgical History:  Procedure Laterality Date  . ABDOMINAL HYSTERECTOMY      partial  . BREAST SURGERY     lumpectomy left breast  . carpel tunnel    . COLONOSCOPY W/ POLYPECTOMY    . FOOT SURGERY    . knee arhtroscopy    . KNEE ARTHROPLASTY    . SHOULDER ARTHROSCOPY WITH SUBACROMIAL DECOMPRESSION, ROTATOR CUFF REPAIR AND BICEP TENDON REPAIR Right 05/26/2012   Procedure: Right Shoulder Diagnostic Operative Arthroscopy, Subacromial Decompression, Biceps Tenodesis, Mini Open Rotator Cuff Repair;  Surgeon: [name], MD;  Location: [facility];  Service: Orthopedics;  Laterality: Right;  Right Shoulder Diagnostic Operative Arthroscopy, Subacromial Decompression, Biceps Tenodesis, Mini Open Rotator Cuff Repair  . TONSILLECTOMY    . TOTAL KNEE ARTHROPLASTY Left 07/01/2019   Procedure: LEFT TOTAL KNEE ARTHROPLASTY;  Surgeon: [name], MD;  Location: [facility];  Service: Orthopedics;  Laterality: Left;   Social History   Social History Narrative  . Not on file   Immunization History  Administered Date(s) Administered  . Fluad Quad(high Dose 65+) 01/02/2019  . Influenza Split 01/03/2011  . Influenza Whole 04/01/2005, 12/18/2009  . Influenza, High Dose Seasonal PF 01/07/2014, 01/27/2015, 01/02/2016, 01/13/2017, 01/16/2018  . Influenza-Unspecified 11/30/2012  . PFIZER SARS-COV-2 Vaccination 05/28/2019, 06/23/2019, 01/15/2020  . Pneumococcal Conjugate-13 09/12/2016, 01/02/2019  . Pneumococcal Polysaccharide-23 07/15/2014, 12/27/2019  . Td 04/01/2001, 10/07/2013     Objective: Vital Signs: BP 136/78 (BP Location: Right Arm, Patient Position: Sitting, Cuff Size: Normal)   Pulse 80   Resp 16   Ht 5\' 5"  (1.651 m)   Wt 237 lb 9.6 oz (107.8 kg)   BMI 39.54 kg/m    Physical Exam Vitals and nursing note reviewed.  Constitutional:      Appearance: She is well-developed.  HENT:     Head: Normocephalic and atraumatic.  Eyes:     Conjunctiva/sclera: Conjunctivae normal.  Abdominal:     Palpations: Abdomen is soft.  Musculoskeletal:     Cervical back:  Normal range of motion.  Skin:    General: Skin is warm and dry.     Capillary Refill: Capillary refill takes less than 2 seconds.  Neurological:     Mental Status: She is alert and oriented to person, place, and time.  Psychiatric:        Behavior: Behavior normal.      Musculoskeletal Exam: C-spine, thoracic spine, and lumbar spine good ROM.  Shoulder joints and elbow joints good ROM with no discomfort.  Limited ROM with flexion and extension of the wrist joint.  Extensor tenosynovitis of the right wrist.  Tenderness of right 2nd, 3rd, 4th, and 5th MCPs and right 2nd, 3rd, and 4th PIP joints. Complete fist formation bilaterally.  Left knee replacement has good ROM with warmth.  Right knee has good ROM with no warmth or effusion.  Ankle joints good ROM.  Pedal edema noted bilaterally.   CDAI Exam: CDAI Score: 3.4  Patient Global: 8 mm; Provider Global: 6 mm Swollen: 1 ; Tender: 1  Joint Exam 02/21/2020      Right  Left  Wrist  Swollen Tender  Investigation: No additional findings.  Imaging: XR Knee 1-2 Views Left  Result Date: 02/09/2020 Lateral views left knee reviewed.  Total knee prosthesis in good position alignment.  Small inferior pole avulsion fracture is present.  Unchanged in appearance from prior radiograph 3 weeks ago.   Recent Labs: Lab Results  Component Value Date   WBC 10.8 01/24/2020   HGB 12.1 01/24/2020   PLT 270 01/24/2020   NA 141 01/24/2020   K 3.9 01/24/2020   CL 105 01/24/2020   CO2 27 01/24/2020   GLUCOSE 104 (H) 01/24/2020   BUN 9 01/24/2020   CREATININE 0.67 01/24/2020   BILITOT 0.3 01/24/2020   ALKPHOS 45 11/25/2019   AST 17 01/24/2020   ALT 16 01/24/2020   PROT 6.7 01/24/2020   ALBUMIN 4.0 11/25/2019   CALCIUM 10.0 01/24/2020   GFRAA 102 01/24/2020   QFTBGOLDPLUS NEGATIVE 12/24/2018    Speciality Comments: PLQ Eye Exam: 12/22/2019 WNL @ [facility] follow up in 6 months  Methotrexate-hair loss  Procedures:  No  procedures performed Allergies: Contrast media [iodinated diagnostic agents], Adhesive [tape], Erythromycin, and Penicillins   Assessment / Plan:     Visit Diagnoses: Rheumatoid arthritis involving multiple sites with positive rheumatoid factor ([place]): She presents today with extensor tenosynovitis of the right wrist.  She has tenderness of the right 2nd-5th MCPs and right 2nd-4th PIP joints.  She is currently on plaquenil 200 mg 1 tablet by mouth twice daily and arava 20 mg 1 tablet by mouth daily.  She was started on arava about 6 weeks ago and has not noticed any clinical improvement.  Previously had inadequate response and increased hair loss while on MTX. She has been taking tylenol and using aspercreme topically as needed for pain relief.  She continues to experience nocturnal pain and difficulty with ADLs.  Different treatment options were discussed today.  Indications, contraindications, and potential side effects of Humira were discussed in detail and all questions were addressed.  Consent was obtained. Once approved through her insurance she will be scheduled for a visit for the administration of the first dose.  Dose: Humira 40 mg sq injections once every 14 days.  She will continue on arava and plaquenil as prescribed, and once she starts on humira she will discontinue PLQ  She is in agreement with the plan.  She will follow up in the office in 8 weeks to assess her response.  Counseled patient that Humira is a TNF blocking agent.  Counseled patient on purpose, proper use, and adverse effects of Humira.  Reviewed the most common adverse effects including infections, headache, and injection site reactions. Discussed that there is the possibility of an increased risk of malignancy but it is not well understood if this increased risk is due to the medication or the disease state.  Advised patient to get yearly dermatology exams due to risk of skin cancer. Counseled patient that Humira should be held  prior to scheduled surgery.  Counseled patient to avoid live vaccines while on Humira.  Advised patient to get annual influenza vaccine and the pneumococcal vaccine as indicated.    Reviewed the importance of regular labs while on Humira therapy.  Standing orders placed.  Provided patient with medication education material and answered all questions.  Patient consented to Humira.  Will upload consent into the media tab.  Reviewed storage instructions of Humira.  Advised initial injection must be administered in office.  Patient verbalized understanding.  Dose will be for rheumatoid arthritis  Humira 40 mg every 14 days.  Prescription pending lab results and/or insurance approval.  High risk medication use -Applying for Humira through her insurance.  She will continue taking Plaquenil 200 mg 1 tablet by mouth twice daily and Arava 20mg  po qd. She will discontinue PLQ once she starts on Humira.  (previously on MTX 7 tablets po once weekly, and folic acid 2 mg po daily). Discontinued MTX due to hair loss. PLQ eye Exam: 12/22/19.  CBC and CMP updated on 01/24/20.  CBC and CMP will be updated today, in 1 month, then every 3 months.  Standing orders for CBC and CMP are in place.  TB gold negative on 12/24/18.  Order for TB gold released today.    - Plan: COMPLETE METABOLIC PANEL WITH GFR, CBC with Differential/Platelet She has received all 3 covid-19 vaccine doses.  Discussed the importance of holding arava and humira if she develops signs or symptoms of an infection and to resume once the infection has completely cleared.  Primary osteoarthritis of both feet: She is not experiencing any discomfort in her feet at this time.  Pedal edema noted bilaterally.   S/P total knee replacement, left - Performed by Dr. Marlou Sa on 07/01/19. She has persistent left knee joint pain.  No recent falls or injuries. She has good ROM on exam today with warmth but no effusion.  She was recently evaluated by Dr. Marlou Sa on 02/09/20 and had  updated x-rays: prosthesis in good position.  Small inferior pole avulsion fracture present and unchanged from prior x-rays on 01/17/20.   Osteopenia, unspecified location: DEXA not in epic. She is taking a calcium and vitamin D supplement on a daily basis.    Other medical conditions are listed as follows:   Essential hypertension  History of hypothyroidism  Hyperlipidemia associated with type 2 diabetes mellitus ([place])  Diabetes mellitus type 2 with complications ([place])  Orders: Orders Placed This Encounter  Procedures  . COMPLETE METABOLIC PANEL WITH GFR  . CBC with Differential/Platelet  . QuantiFERON-TB Gold Plus   No orders of the defined types were placed in this encounter.    Follow-Up Instructions: Return in about 8 weeks (around 04/17/2020) for Rheumatoid arthritis.   Ofilia Neas, PA-C  Note - This record has been created using Dragon software.  Chart creation errors have been sought, but may not always  have been located. Such creation errors do not reflect on  the standard of medical care.

## 2020-02-09 ENCOUNTER — Ambulatory Visit (INDEPENDENT_AMBULATORY_CARE_PROVIDER_SITE_OTHER): Payer: Medicare Other

## 2020-02-09 ENCOUNTER — Encounter: Payer: Self-pay | Admitting: Orthopedic Surgery

## 2020-02-09 ENCOUNTER — Ambulatory Visit (INDEPENDENT_AMBULATORY_CARE_PROVIDER_SITE_OTHER): Payer: Medicare Other | Admitting: Orthopedic Surgery

## 2020-02-09 DIAGNOSIS — Z96652 Presence of left artificial knee joint: Secondary | ICD-10-CM

## 2020-02-09 NOTE — Progress Notes (Signed)
Office Visit Note   Patient: Marissa Roberts           Date of Birth: 06-Nov-1945           MRN: [id number] Visit Date: 02/09/2020 Requested by: [name], MD [address] [place],  [place] [address] PCP: [name], MD  Subjective: Chief Complaint  Patient presents with  . Left Knee - Pain    HPI: Marissa Roberts is a 74 year old patient underwent left total knee replacement earlier this year.  Had an injury about 4 weeks ago.  She was seen and had a small avulsion fracture of the inferior pole of patella.  She is been in a knee immobilizer.  Denies much in the way of pain at this time.  Takes Tylenol as needed.              ROS: All systems reviewed are negative as they relate to the chief complaint within the history of present illness.  Patient denies  fevers or chills.   Assessment & Plan: Visit Diagnoses:  1. Status post total left knee replacement     Plan: Impression is left total knee replacement with small avulsion fracture off the inferior pole of patella which is unchanged in appearance.  Extensor mechanism is intact on exam.  No pain at the inferior pole of the patella.  Continue with activity as tolerated follow-up as needed.  Follow-Up Instructions: Return if symptoms worsen or fail to improve.   Orders:  Orders Placed This Encounter  Procedures  . XR Knee 1-2 Views Left   No orders of the defined types were placed in this encounter.     Procedures: No procedures performed   Clinical Data: No additional findings.  Objective: Vital Signs: There were no vitals taken for this visit.  Physical Exam:   Constitutional: Patient appears well-developed HEENT:  Head: Normocephalic Eyes:EOM are normal Neck: Normal range of motion Cardiovascular: Normal rate Pulmonary/chest: Effort normal Neurologic: Patient is alert Skin: Skin is warm Psychiatric: Patient has normal mood and affect    Ortho Exam: Ortho exam demonstrates full  active and passive range of motion of the ankle and hip.  Left knee has no effusion intact since mechanism with no lag full extension against gravity.  About 110 of flexion easily.  No tenderness to palpation anywhere around the patella.  Specialty Comments:  No specialty comments available.  Imaging: XR Knee 1-2 Views Left  Result Date: 02/09/2020 Lateral views left knee reviewed.  Total knee prosthesis in good position alignment.  Small inferior pole avulsion fracture is present.  Unchanged in appearance from prior radiograph 3 weeks ago.    PMFS History: Patient Active Problem List   Diagnosis Date Noted  . Rash 10/11/2019  . Arthritis of left knee 07/01/2019  . B12 deficiency 03/23/2019  . Anemia 02/05/2019  . Rheumatoid arthritis with rheumatoid factor of multiple sites without organ or systems involvement ([place]) 01/25/2019  . High risk medication use 01/25/2019  . Fatigue 09/18/2018  . Routine general medical examination at a health care facility 02/07/2015  . Morbid obesity ([place]) 04/19/2013  . Hyperlipidemia associated with type 2 diabetes mellitus ([place]) 07/28/2009  . Osteopenia 03/14/2009  . Diabetes mellitus type 2 with complications ([place]) 61/60/7371  . Hypothyroidism 09/24/2006  . Essential hypertension 09/24/2006   Past Medical History:  Diagnosis Date  . Arthritis   . Bronchitis    hx of  . Diabetes mellitus   . Family history of adverse  reaction to anesthesia    per patient, "daughter threw up after a knee surgery"  . GERD (gastroesophageal reflux disease)   . Hypercholesteremia   . Hypertension   . Hypothyroidism   . Pneumonia    hx of  . PONV (postoperative nausea and vomiting)   . Thyroid disease     Family History  Problem Relation Age of Onset  . Diabetes Mother   . Heart disease Mother   . Hyperlipidemia Mother   . Diabetes Father   . Diabetes Sister   . Hypertension Sister   . Heart disease Sister   . Dementia Sister   . Heart disease  Brother   . Healthy Daughter   . Healthy Daughter   . Healthy Daughter   . Healthy Son     Past Surgical History:  Procedure Laterality Date  . ABDOMINAL HYSTERECTOMY     partial  . BREAST SURGERY     lumpectomy left breast  . carpel tunnel    . COLONOSCOPY W/ POLYPECTOMY    . FOOT SURGERY    . knee arhtroscopy    . KNEE ARTHROPLASTY    . SHOULDER ARTHROSCOPY WITH SUBACROMIAL DECOMPRESSION, ROTATOR CUFF REPAIR AND BICEP TENDON REPAIR Right 05/26/2012   Procedure: Right Shoulder Diagnostic Operative Arthroscopy, Subacromial Decompression, Biceps Tenodesis, Mini Open Rotator Cuff Repair;  Surgeon: [name], MD;  Location: [facility];  Service: Orthopedics;  Laterality: Right;  Right Shoulder Diagnostic Operative Arthroscopy, Subacromial Decompression, Biceps Tenodesis, Mini Open Rotator Cuff Repair  . TONSILLECTOMY    . TOTAL KNEE ARTHROPLASTY Left 07/01/2019   Procedure: LEFT TOTAL KNEE ARTHROPLASTY;  Surgeon: [name], MD;  Location: [facility];  Service: Orthopedics;  Laterality: Left;   Social History   Occupational History  . Not on file  Tobacco Use  . Smoking status: Former Smoker    Quit date: 04/01/1990    Years since quitting: 29.8  . Smokeless tobacco: Never Used  Vaping Use  . Vaping Use: Never used  Substance and Sexual Activity  . Alcohol use: Not Currently    Comment: 06/22/2019: per patient about 20-25 years ago  . Drug use: No  . Sexual activity: Never    Birth control/protection: Abstinence

## 2020-02-10 ENCOUNTER — Other Ambulatory Visit: Payer: Self-pay | Admitting: Internal Medicine

## 2020-02-10 ENCOUNTER — Other Ambulatory Visit: Payer: Self-pay | Admitting: Endocrinology

## 2020-02-18 ENCOUNTER — Telehealth: Payer: Self-pay | Admitting: Internal Medicine

## 2020-02-18 ENCOUNTER — Other Ambulatory Visit: Payer: Self-pay | Admitting: Internal Medicine

## 2020-02-18 NOTE — Telephone Encounter (Signed)
  Levothyroxine Sodium 137 MCG   Montelukast Sodium 10 MG   [place], [place] Phone:  [phone]  Fax:  [phone]     Requesting a refill

## 2020-02-21 ENCOUNTER — Other Ambulatory Visit: Payer: Self-pay

## 2020-02-21 ENCOUNTER — Telehealth: Payer: Self-pay | Admitting: Orthopedic Surgery

## 2020-02-21 ENCOUNTER — Encounter: Payer: Self-pay | Admitting: Physician Assistant

## 2020-02-21 ENCOUNTER — Ambulatory Visit (INDEPENDENT_AMBULATORY_CARE_PROVIDER_SITE_OTHER): Payer: Medicare Other | Admitting: Physician Assistant

## 2020-02-21 ENCOUNTER — Telehealth: Payer: Self-pay | Admitting: Pharmacist

## 2020-02-21 VITALS — BP 136/78 | HR 80 | Resp 16 | Ht 65.0 in | Wt 237.6 lb

## 2020-02-21 DIAGNOSIS — I10 Essential (primary) hypertension: Secondary | ICD-10-CM

## 2020-02-21 DIAGNOSIS — M19071 Primary osteoarthritis, right ankle and foot: Secondary | ICD-10-CM

## 2020-02-21 DIAGNOSIS — E1169 Type 2 diabetes mellitus with other specified complication: Secondary | ICD-10-CM

## 2020-02-21 DIAGNOSIS — Z96652 Presence of left artificial knee joint: Secondary | ICD-10-CM | POA: Diagnosis not present

## 2020-02-21 DIAGNOSIS — Z79899 Other long term (current) drug therapy: Secondary | ICD-10-CM | POA: Diagnosis not present

## 2020-02-21 DIAGNOSIS — M19072 Primary osteoarthritis, left ankle and foot: Secondary | ICD-10-CM

## 2020-02-21 DIAGNOSIS — M8580 Other specified disorders of bone density and structure, unspecified site: Secondary | ICD-10-CM

## 2020-02-21 DIAGNOSIS — M0579 Rheumatoid arthritis with rheumatoid factor of multiple sites without organ or systems involvement: Secondary | ICD-10-CM | POA: Diagnosis not present

## 2020-02-21 DIAGNOSIS — Z111 Encounter for screening for respiratory tuberculosis: Secondary | ICD-10-CM

## 2020-02-21 DIAGNOSIS — Z8639 Personal history of other endocrine, nutritional and metabolic disease: Secondary | ICD-10-CM

## 2020-02-21 DIAGNOSIS — E118 Type 2 diabetes mellitus with unspecified complications: Secondary | ICD-10-CM

## 2020-02-21 DIAGNOSIS — E785 Hyperlipidemia, unspecified: Secondary | ICD-10-CM

## 2020-02-21 MED ORDER — LEVOTHYROXINE SODIUM 137 MCG PO TABS: 137.0000 ug | ORAL_TABLET | Freq: Every day | ORAL | 0 refills | Status: DC

## 2020-02-21 MED ORDER — MONTELUKAST SODIUM 10 MG PO TABS
10.0000 mg | ORAL_TABLET | Freq: Every day | ORAL | 0 refills | Status: DC
Start: 2020-02-21 — End: 2020-05-17

## 2020-02-21 NOTE — Telephone Encounter (Signed)
Pending a Prior Authorization request to [facility] for HUMIRA Pen 40mg  every 14 days via Cover My Meds. Will update once we receive a response.  PA Key: [id number]  PA Case ID: [id number]  Knox Saliva, PharmD, MPH Clinical Pharmacist (Rheumatology and Pulmonology)

## 2020-02-21 NOTE — Telephone Encounter (Signed)
Pt came into the office wanting to speak with lauren about her left knee.  She is experiencing swelling, rash on her knee cap, and she is still in pain.

## 2020-02-21 NOTE — Patient Instructions (Addendum)
Standing Labs We placed an order today for your standing lab work.   Please have your standing labs drawn in 1 month then every 3 months   If possible, please have your labs drawn 2 weeks prior to your appointment so that the provider can discuss your results at your appointment.  We have open lab daily Monday through Thursday from 8:30-12:30 PM and 1:30-4:30 PM and Friday from 8:30-12:30 PM and 1:30-4:00 PM at the office of Dr. Bo Merino, [facility].   Please be advised, patients with office appointments requiring lab work will take precedents over walk-in lab work.  If possible, please come for your lab work on Monday and Friday afternoons, as you may experience shorter wait times. The office is located at [address], [place], , [place] [address] No appointment is necessary.   Labs are drawn by [facility]. Please bring your co-pay at the time of your lab draw.  You may receive a bill from [facility] for your lab work.  If you wish to have your labs drawn at another location, please call the office 24 hours in advance to send orders.  If you have any questions regarding directions or hours of operation,  please call [phone].   As a reminder, please drink plenty of water prior to coming for your lab work. Thanks!    Adalimumab Injection What is this medicine? ADALIMUMAB (a dal AYE mu mab) is used to treat rheumatoid and psoriatic arthritis. It is also used to treat ankylosing spondylitis, Crohn's disease, ulcerative colitis, plaque psoriasis, hidradenitis suppurativa, and uveitis. This medicine may be used for other purposes; ask your health care provider or pharmacist if you have questions. COMMON BRAND NAME(S): CYLTEZO, Humira What should I tell my health care provider before I take this medicine? They need to know if you have any of these conditions:  diabetes  heart disease  hepatitis B or history of hepatitis B infection  immune system  problems  infection or history of infections  multiple sclerosis  recently received or scheduled to receive a vaccine  scheduled to have surgery  tuberculosis, a positive skin test for tuberculosis or have recently been in close contact with someone who has tuberculosis  an unusual reaction to adalimumab, other medicines, mannitol, latex, rubber, foods, dyes, or preservatives  pregnant or trying to get pregnant  breast-feeding How should I use this medicine? This medicine is for injection under the skin. You will be taught how to prepare and give this medicine. Use exactly as directed. Take your medicine at regular intervals. Do not take your medicine more often than directed. A special MedGuide will be given to you by the pharmacist with each prescription and refill. Be sure to read this information carefully each time. It is important that you put your used needles and syringes in a special sharps container. Do not put them in a trash can. If you do not have a sharps container, call your pharmacist or healthcare provider to get one. Talk to your pediatrician regarding the use of this medicine in children. While this drug may be prescribed for children as young as 2 years for selected conditions, precautions do apply. The manufacturer of the medicine offers free information to patients and their health care partners. Call [phone] for more information. Overdosage: If you think you have taken too much of this medicine contact a poison control center or emergency room at once. NOTE: This medicine is only for you. Do not share this medicine  with others. What if I miss a dose? If you miss a dose, take it as soon as you can. If it is almost time for your next dose, take only that dose. Do not take double or extra doses. Give the next dose when your next scheduled dose is due. Call your doctor or health care professional if you are not sure how to handle a missed dose. What may interact  with this medicine? Do not take this medicine with any of the following medications:  abatacept  anakinra  etanercept  infliximab  live virus vaccines  rilonacept This medicine may also interact with the following medications:  vaccines This list may not describe all possible interactions. Give your health care provider a list of all the medicines, herbs, non-prescription drugs, or dietary supplements you use. Also tell them if you smoke, drink alcohol, or use illegal drugs. Some items may interact with your medicine. What should I watch for while using this medicine? Visit your doctor or health care professional for regular checks on your progress. Tell your doctor or healthcare professional if your symptoms do not start to get better or if they get worse. You will be tested for tuberculosis (TB) before you start this medicine. If your doctor prescribes any medicine for TB, you should start taking the TB medicine before starting this medicine. Make sure to finish the full course of TB medicine. Call your doctor or health care professional if you get a cold or other infection while receiving this medicine. Do not treat yourself. This medicine may decrease your body's ability to fight infection. Talk to your doctor about your risk of cancer. You may be more at risk for certain types of cancers if you take this medicine. What side effects may I notice from receiving this medicine? Side effects that you should report to your doctor or health care professional as soon as possible:  allergic reactions like skin rash, itching or hives, swelling of the face, lips, or tongue  breathing problems  changes in vision  chest pain  fever, chills, or any other sign of infection  numbness or tingling  red, scaly patches or raised bumps on the skin  swelling of the ankles  swollen lymph nodes in the neck, underarm, or groin areas  unexplained weight loss  unusual bleeding or  bruising  unusually weak or tired Side effects that usually do not require medical attention (report to your doctor or health care professional if they continue or are bothersome):  headache  nausea  redness, itching, swelling, or bruising at site where injected This list may not describe all possible side effects. Call your doctor for medical advice about side effects. You may report side effects to FDA at 1-800-FDA-1088. Where should I keep my medicine? Keep out of the reach of children. Store in the original container and in the refrigerator between 2 and 8 degrees C (36 and 46 degrees F). Do not freeze. The product may be stored in a cool carrier with an ice pack, if needed. Protect from light. Throw away any unused medicine after the expiration date. NOTE: This sheet is a summary. It may not cover all possible information. If you have questions about this medicine, talk to your doctor, pharmacist, or health care provider.  2020 Elsevier/Gold Standard (2018-01-05 13:22:46)

## 2020-02-22 NOTE — Progress Notes (Signed)
CBC WNL

## 2020-02-22 NOTE — Progress Notes (Signed)
Glucose is 125. Rest of CMP WNL.

## 2020-02-22 NOTE — Telephone Encounter (Signed)
Tried calling patient to discuss. No answer. LMVM for her to call back to discuss.

## 2020-02-22 NOTE — Telephone Encounter (Signed)
Humira Copay card details:  Card: [id number]  Rx GROUP: [id number]  Rx BIN: [id number]  Rx PCN: [id number]  Suf: 01

## 2020-02-22 NOTE — Telephone Encounter (Signed)
Patient is scheduled for Friday, 03/10/20 at 9:40 with Lovena Le

## 2020-02-22 NOTE — Telephone Encounter (Signed)
Received notification from [facility] regarding a prior authorization for [place]. Authorization has been APPROVED from 02/22/20 to 02/21/21.   [facility] Pharmacy phone: [phone] Pharmacy fax: [phone]  Authorization # [id number] Phone # [phone]  Knox Saliva, PharmD, MPH Clinical Pharmacist (Rheumatology and Pulmonology)

## 2020-02-23 LAB — COMPLETE METABOLIC PANEL WITH GFR
AG Ratio: 1.6 (calc) (ref 1.0–2.5)
ALT: 22 U/L (ref 6–29)
AST: 18 U/L (ref 10–35)
Albumin: 4 g/dL (ref 3.6–5.1)
Alkaline phosphatase (APISO): 56 U/L (ref 37–153)
BUN: 9 mg/dL (ref 7–25)
CO2: 28 mmol/L (ref 20–32)
Calcium: 9.7 mg/dL (ref 8.6–10.4)
Chloride: 103 mmol/L (ref 98–110)
Creat: 0.72 mg/dL (ref 0.60–0.93)
GFR, Est African American: 97 mL/min/{1.73_m2} (ref >=60)
GFR, Est Non African American: 84 mL/min/{1.73_m2} (ref >=60)
Globulin: 2.5 g/dL (calc) (ref 1.9–3.7)
Glucose, Bld: 125 mg/dL — ABNORMAL HIGH (ref 65–99)
Potassium: 3.9 mmol/L (ref 3.5–5.3)
Sodium: 141 mmol/L (ref 135–146)
Total Bilirubin: 0.3 mg/dL (ref 0.2–1.2)
Total Protein: 6.5 g/dL (ref 6.1–8.1)

## 2020-02-23 LAB — CBC WITH DIFFERENTIAL/PLATELET
Absolute Monocytes: 887 cells/uL (ref 200–950)
Basophils Absolute: 71 cells/uL (ref 0–200)
Basophils Relative: 0.7 %
Eosinophils Absolute: 357 cells/uL (ref 15–500)
Eosinophils Relative: 3.5 %
HCT: 37.2 % (ref 35.0–45.0)
Hemoglobin: 12.2 g/dL (ref 11.7–15.5)
Lymphs Abs: 2805 cells/uL (ref 850–3900)
MCH: 28.7 pg (ref 27.0–33.0)
MCHC: 32.8 g/dL (ref 32.0–36.0)
MCV: 87.5 fL (ref 80.0–100.0)
MPV: 12.4 fL (ref 7.5–12.5)
Monocytes Relative: 8.7 %
Neutro Abs: 6079 cells/uL (ref 1500–7800)
Neutrophils Relative %: 59.6 %
Platelets: 244 10*3/uL (ref 140–400)
RBC: 4.25 10*6/uL (ref 3.80–5.10)
RDW: 13.9 % (ref 11.0–15.0)
Total Lymphocyte: 27.5 %
WBC: 10.2 10*3/uL (ref 3.8–10.8)

## 2020-02-23 LAB — QUANTIFERON-TB GOLD PLUS
Mitogen-NIL: >10 IU/mL
NIL: 0.03 IU/mL
QuantiFERON-TB Gold Plus: NEGATIVE
TB1-NIL: 0.01 IU/mL
TB2-NIL: 0 IU/mL

## 2020-02-23 NOTE — Progress Notes (Signed)
TB gold negative

## 2020-02-29 ENCOUNTER — Other Ambulatory Visit: Payer: Self-pay

## 2020-02-29 ENCOUNTER — Other Ambulatory Visit: Payer: Self-pay | Admitting: Physician Assistant

## 2020-02-29 ENCOUNTER — Other Ambulatory Visit (INDEPENDENT_AMBULATORY_CARE_PROVIDER_SITE_OTHER): Payer: Medicare Other

## 2020-02-29 DIAGNOSIS — E039 Hypothyroidism, unspecified: Secondary | ICD-10-CM

## 2020-02-29 DIAGNOSIS — E119 Type 2 diabetes mellitus without complications: Secondary | ICD-10-CM

## 2020-02-29 DIAGNOSIS — Z794 Long term (current) use of insulin: Secondary | ICD-10-CM | POA: Diagnosis not present

## 2020-02-29 LAB — TSH: TSH: 2.03 u[IU]/mL (ref 0.35–4.50)

## 2020-02-29 LAB — BASIC METABOLIC PANEL
BUN: 10 mg/dL (ref 6–23)
CO2: 30 mEq/L (ref 19–32)
Calcium: 9.8 mg/dL (ref 8.4–10.5)
Chloride: 101 mEq/L (ref 96–112)
Creatinine, Ser: 0.7 mg/dL (ref 0.40–1.20)
GFR: 86.35 mL/min (ref >60.00)
Glucose, Bld: 79 mg/dL (ref 70–99)
Potassium: 3.8 mEq/L (ref 3.5–5.1)
Sodium: 139 mEq/L (ref 135–145)

## 2020-02-29 LAB — HEMOGLOBIN A1C: Hgb A1c MFr Bld: 5.7 % (ref 4.6–6.5)

## 2020-02-29 NOTE — Telephone Encounter (Signed)
Last Visit: 02/21/2020 Next Visit: 03/09/2020  Okay to refill per Dr. Estanislado Pandy

## 2020-03-07 ENCOUNTER — Other Ambulatory Visit: Payer: Self-pay

## 2020-03-07 ENCOUNTER — Ambulatory Visit: Payer: Medicare Other | Admitting: Endocrinology

## 2020-03-07 ENCOUNTER — Ambulatory Visit: Payer: Medicare Other | Admitting: Physician Assistant

## 2020-03-07 VITALS — BP 142/80 | HR 74 | Resp 16 | Ht 65.0 in | Wt 234.0 lb

## 2020-03-07 DIAGNOSIS — R809 Proteinuria, unspecified: Secondary | ICD-10-CM

## 2020-03-07 DIAGNOSIS — E1129 Type 2 diabetes mellitus with other diabetic kidney complication: Secondary | ICD-10-CM | POA: Diagnosis not present

## 2020-03-07 DIAGNOSIS — E119 Type 2 diabetes mellitus without complications: Secondary | ICD-10-CM | POA: Diagnosis not present

## 2020-03-07 DIAGNOSIS — I10 Essential (primary) hypertension: Secondary | ICD-10-CM

## 2020-03-07 DIAGNOSIS — E039 Hypothyroidism, unspecified: Secondary | ICD-10-CM | POA: Diagnosis not present

## 2020-03-07 DIAGNOSIS — Z794 Long term (current) use of insulin: Secondary | ICD-10-CM

## 2020-03-07 NOTE — Patient Instructions (Addendum)
Check blood sugars on waking up ONLY 3-4 days a week  Also check blood sugars about 2 hours after meals and do this after different meals by rotation  Recommended blood sugar levels on waking up are 90-130 and about 2 hours after meal is 130-160  Please bring your blood sugar monitor to each visit, thank you  32 Tresiba daily, DO 30 if am stays < 90 after 1 week

## 2020-03-07 NOTE — Progress Notes (Signed)
Patient ID: Marissa Roberts, female   DOB: [birth year], 74 y.o.   MRN: [id number]               Reason for Appointment:  Follow-up for diabetes and hypertension   History of Present Illness:             Type 2 diabetes mellitus, date of diagnosis: 2005       INSULIN regimen is  Tresiba 34 units am, Novolog  10  units bid- tid ac  Oral hypoglycemic drugs the patient is taking are: Metformin 2 g daily, Farxiga 5 mg daily   Current blood sugar patterns, management and problems identified:  Her A1c is again excellent at 5.7, previously 5.4    She has been on [place] since 05/2019 with consistent improvement in her control  Since she was having low normal sugars on her last visit her Tyler Aas was reduced by 2 units again down to 34 units  She was also told to reduce it further if she has low normal readings  With this regimen her FASTING readings are still near normal and occasionally low normal including 79 in the lab  Postprandial readings in the evenings are not being checked now  She said that she will take 10 units of NovoLog regardless of what she is eating  If by mistake she is taking 14 units and not eating a big meal she will feel hypoglycemic  She has lost 3 pounds recently  As before is not able to do much exercise     Dinner usually at 6-7pm  Side effects from medications have been:none  Compliance with the medical regimen: Fair  Glucose monitoring:  done 1 time a day         Glucometer:  One Touch.       Blood Glucose data from review of monitor download:  FASTING range 82-118 AVERAGE 101  Previously:  PRE-MEAL Fasting Lunch Dinner Bedtime Overall  Glucose range:  83-104    108  83-136  Mean/median:  95     102   POST-MEAL PC Breakfast PC Lunch PC Dinner  Glucose range:    90-136  Mean/median:    114     Self-care:   Meals: 3 meals per day. Breakfast is usually grits with or without eggs or meat Not always restricting fat intake or sweet tea   when eating out        Exercise:  Minimal, knee pain  Dietician visit, most recent:2014.               Weight history:  Wt Readings from Last 3 Encounters:  03/07/20 234 lb (106.1 kg)  02/21/20 237 lb 9.6 oz (107.8 kg)  01/10/20 237 lb 12.8 oz (107.9 kg)    Glycemic control:   Lab Results  Component Value Date   HGBA1C 5.7 02/29/2020   HGBA1C 5.7 11/25/2019   HGBA1C 5.4 08/24/2019   Lab Results  Component Value Date   MICROALBUR 18.8 (H) 11/25/2019   LDLCALC 61 08/24/2019   CREATININE 0.70 02/29/2020   No results found for: FRUCTOSAMINE    Past history:  She thinks her blood sugars were only mildly increased at borderline levels at the onsetper Most likely she was treated with metformin initially and this has been continued.   At some point she was also tried on [place] and [place] for improving her control. She is not sure why she was started on insulin 5 years ago, presumably for worsening  hyperglycemia Also not clear if she has tried various insulin regimens before starting Lantus and NovoLog  Previously had tried the V-go pump but subsequently she stopped doing the V-go pump because of the cost, skin irritation and local discomfort   Allergies as of 03/07/2020      Reactions   Contrast Media [iodinated Diagnostic Agents]    Throat swells from ct con tra   Adhesive [tape]    No paper tape, causes blisters   Erythromycin Rash   Penicillins Rash   Did it involve swelling of the face/tongue/throat, SOB, or low BP? No Did it involve sudden or severe rash/hives, skin peeling, or any reaction on the inside of your mouth or nose? No Did you need to seek medical attention at a hospital or doctor's office? No When did it last happen?Many years If all above answers are "NO", may proceed with cephalosporin use.      Medication List       Accurate as of March 07, 2020 10:07 AM. If you have any questions, ask your nurse or doctor.        amLODipine 5 MG  tablet Commonly known as: NORVASC Take 1 tablet (5 mg total) by mouth daily.   BD Pen Needle Nano U/F 32G X 4 MM Misc Generic drug: Insulin Pen Needle USE  4 TIMES DAILY TO  INJECT  INSULIN   Biotin 10000 MCG Tabs Take 10,000 mcg by mouth daily.   calcium-vitamin D 500-200 MG-UNIT tablet Commonly known as: OSCAL WITH D Take 2 tablets by mouth daily with breakfast.   cephALEXin 500 MG capsule Commonly known as: KEFLEX Take 4 capsules (2,000 mg) 30-60 minutes prior to dental procedure   dapagliflozin propanediol 5 MG Tabs tablet Commonly known as: Farxiga Take 1 tablet (5 mg total) by mouth daily.   fluticasone 50 MCG/ACT nasal spray Commonly known as: FLONASE Place 2 sprays into both nostrils 2 (two) times daily as needed for allergies or rhinitis.   folic acid 1 MG tablet Commonly known as: FOLVITE Take 2 tablets by mouth once daily   furosemide 40 MG tablet Commonly known as: LASIX Take 2 tablets by mouth once daily   GLUCOSAMINE PO Take 1 capsule by mouth daily.   hydroxychloroquine 200 MG tablet Commonly known as: PLAQUENIL Take 1 tablet (200 mg total) by mouth 2 (two) times daily.   IRON PO Take by mouth daily.   leflunomide 20 MG tablet Commonly known as: ARAVA Take 1 tablet (20 mg total) by mouth daily.   levothyroxine 137 MCG tablet Commonly known as: SYNTHROID Take 1 tablet (137 mcg total) by mouth daily.   linaclotide 290 MCG Caps capsule Commonly known as: LINZESS Take 1 capsule (290 mcg total) by mouth daily as needed.   metFORMIN 1000 MG tablet Commonly known as: GLUCOPHAGE TAKE 1 TABLET BY MOUTH TWICE DAILY WITH A MEAL   methocarbamol 500 MG tablet Commonly known as: Robaxin Take 1 tablet (500 mg total) by mouth every 8 (eight) hours as needed for muscle spasms.   montelukast 10 MG tablet Commonly known as: SINGULAIR Take 1 tablet (10 mg total) by mouth at bedtime.   multivitamin with minerals Tabs tablet Take 1 tablet by mouth daily.    NovoLOG FlexPen 100 UNIT/ML FlexPen Generic drug: insulin aspart INJECT 10-15 UNITS SUBCUTANEOUSLY THREE TIMES DAILY BEFORE MEAL(S)   OneTouch Delica Plus [id number] Misc USE TO CHECK BLOOD SUGAR UP TO THREE TIMES A DAY 2 HOURS AFTER A MEAL AND UP  TO 3 TIMES A WEEK UPON WAKING UP   OneTouch Verio test strip Generic drug: glucose blood Use Onetouch verio test strips as instructed to check blood sugars 4 times daily. DX:E11.65   potassium chloride 10 MEQ tablet Commonly known as: KLOR-CON Take 1 tablet by mouth twice daily   rosuvastatin 40 MG tablet Commonly known as: CRESTOR Take 1 tablet (40 mg total) by mouth daily.   Tyler Aas FlexTouch 200 UNIT/ML FlexTouch Pen Generic drug: insulin degludec INJECT 36 UNITS SUBCUTANEOUSLY IN THE MORNING   TYLENOL PO Take by mouth as needed.   valsartan-hydrochlorothiazide 320-12.5 MG tablet Commonly known as: DIOVAN-HCT Take 1 tablet by mouth once daily       Allergies:  Allergies  Allergen Reactions  . Contrast Media [Iodinated Diagnostic Agents]     Throat swells from ct con tra  . Adhesive [Tape]     No paper tape, causes blisters  . Erythromycin Rash  . Penicillins Rash    Did it involve swelling of the face/tongue/throat, SOB, or low BP? No Did it involve sudden or severe rash/hives, skin peeling, or any reaction on the inside of your mouth or nose? No Did you need to seek medical attention at a hospital or doctor's office? No When did it last happen?Many years If all above answers are "NO", may proceed with cephalosporin use.     Past Medical History:  Diagnosis Date  . Arthritis   . Bronchitis    hx of  . Diabetes mellitus   . Family history of adverse reaction to anesthesia    per patient, "daughter threw up after a knee surgery"  . GERD (gastroesophageal reflux disease)   . Hypercholesteremia   . Hypertension   . Hypothyroidism   . Pneumonia    hx of  . PONV (postoperative nausea and vomiting)   .  Thyroid disease     Past Surgical History:  Procedure Laterality Date  . ABDOMINAL HYSTERECTOMY     partial  . BREAST SURGERY     lumpectomy left breast  . carpel tunnel    . COLONOSCOPY W/ POLYPECTOMY    . FOOT SURGERY    . knee arhtroscopy    . KNEE ARTHROPLASTY    . SHOULDER ARTHROSCOPY WITH SUBACROMIAL DECOMPRESSION, ROTATOR CUFF REPAIR AND BICEP TENDON REPAIR Right 05/26/2012   Procedure: Right Shoulder Diagnostic Operative Arthroscopy, Subacromial Decompression, Biceps Tenodesis, Mini Open Rotator Cuff Repair;  Surgeon: [name], MD;  Location: [facility];  Service: Orthopedics;  Laterality: Right;  Right Shoulder Diagnostic Operative Arthroscopy, Subacromial Decompression, Biceps Tenodesis, Mini Open Rotator Cuff Repair  . TONSILLECTOMY    . TOTAL KNEE ARTHROPLASTY Left 07/01/2019   Procedure: LEFT TOTAL KNEE ARTHROPLASTY;  Surgeon: [name], MD;  Location: [facility];  Service: Orthopedics;  Laterality: Left;    Family History  Problem Relation Age of Onset  . Diabetes Mother   . Heart disease Mother   . Hyperlipidemia Mother   . Diabetes Father   . Diabetes Sister   . Hypertension Sister   . Heart disease Sister   . Dementia Sister   . Heart disease Brother   . Healthy Daughter   . Healthy Daughter   . Healthy Daughter   . Healthy Son     Social History:  reports that she quit smoking about 29 years ago. She has never used smokeless tobacco. She reports previous alcohol use. She reports that she does not use drugs.    Review of Systems  HYPERTENSION: She is on amlodipine 5 mg once daily and Diovan HCT 320/12.5  Also has microalbuminuria and because of this as well as tendency to edema she was tried on diltiazem but she felt that this was causing hair loss and constipation   Her blood pressure is upper normal  She is not checking blood pressure at home  BP Readings from Last 3 Encounters:  03/07/20 (!) 142/80  02/21/20 136/78  01/10/20 (!) 146/82     On Lasix 40 mg for edema She thinks she may be getting some swelling now but may be confusing this with having discomfort and is able to get her shoes on even in the evening The dose was reduced when starting [place]   LIPIDS:  She is taking rosuvastatin 40 mg, previously LDL was high with simvastatin 20 mg LDL below 100 Crestor prescribed by PCP      Lab Results  Component Value Date   CHOL 133 08/24/2019   HDL 59.70 08/24/2019   LDLCALC 61 08/24/2019   LDLDIRECT 129.5 02/06/2012   TRIG 64.0 08/24/2019   CHOLHDL 2 08/24/2019                  Thyroid:      She has been hypothyroid for over 40 years and is taking a supplement of 137 g levothyroxine, 6 tablets per week She takes levothyroxine before breakfast daily Has not needed a change in her dosage for some time  TSH has been consistently normal  Last labs:   Lab Results  Component Value Date   TSH 2.03 02/29/2020   TSH 1.75 08/24/2019   TSH 1.24 01/22/2019   FREET4 1.58 08/24/2019   FREET4 1.11 07/21/2018   FREET4 1.05 10/10/2017     Physical Examination:  BP (!) 142/80   Pulse 74   Resp 16   Ht 5\' 5"  (1.651 m)   Wt 234 lb (106.1 kg)   SpO2 98%   BMI 38.94 kg/m   No ankle edema present   ASSESSMENT/PLAN:  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:  See history of present illness for detailed discussion of current diabetes management, blood sugar patterns and problems identified  She is on basal bolus insulin, Farxiga and metformin  A1c is excellent at 5.7  She has had close to normal blood sugars both fasting and after meals Although she did lose significant weight with adding [place] she is not losing anymore and needs to be exercising which she has difficulty doing Taking small doses of NovoLog based on meal size but not clear if she needs it with every meal  Recommendations:   Reduce TRESIBA by 2 units and another 2 units down to 30 if blood sugars are still below 90 next week  Need to check  readings after lunch or dinner and not necessarily every day in the morning  May continue 5 to 10 units of NovoLog based on her meal size  Encourage her to be as active as possible and also look into water aerobics  HYPERTENSION with microalbuminuria: Blood pressure is high normal Since she tends to have edema with higher doses of amlodipine will not change it Also reassured her that she does not have any swelling today  For now she can continue 5 mg amlodipine, new prescription sent She can check blood pressure at home also  Last microalbumin level was 31  EDEMA: With Lasix 40 mg daily this is consistently controlled  HYPOTHYROIDISM: her TSH is normal and stable  There are no Patient Instructions on file for this visit.   Elayne Snare 03/07/2020, 10:07 AM   Note: This office note was prepared with Dragon voice recognition system technology. Any transcriptional errors that result from this process are unintentional.

## 2020-03-08 ENCOUNTER — Ambulatory Visit (INDEPENDENT_AMBULATORY_CARE_PROVIDER_SITE_OTHER): Payer: Medicare Other | Admitting: Orthopedic Surgery

## 2020-03-08 ENCOUNTER — Ambulatory Visit (INDEPENDENT_AMBULATORY_CARE_PROVIDER_SITE_OTHER): Payer: Medicare Other

## 2020-03-08 DIAGNOSIS — M25562 Pain in left knee: Secondary | ICD-10-CM | POA: Diagnosis not present

## 2020-03-09 ENCOUNTER — Ambulatory Visit (INDEPENDENT_AMBULATORY_CARE_PROVIDER_SITE_OTHER): Payer: Medicare Other | Admitting: Pharmacist

## 2020-03-09 ENCOUNTER — Other Ambulatory Visit: Payer: Self-pay

## 2020-03-09 DIAGNOSIS — M0579 Rheumatoid arthritis with rheumatoid factor of multiple sites without organ or systems involvement: Secondary | ICD-10-CM

## 2020-03-09 DIAGNOSIS — Z79899 Other long term (current) drug therapy: Secondary | ICD-10-CM

## 2020-03-09 MED ORDER — HUMIRA (2 PEN) 40 MG/0.4ML ~~LOC~~ AJKT: 40.0000 mg | AUTO-INJECTOR | SUBCUTANEOUS | 0 refills | Status: DC

## 2020-03-09 NOTE — Progress Notes (Signed)
Pharmacy Note  Subjective:   Patient presents to clinic today to receive first dose of Humira.  Maintenance dose will be: Humira 40mg  every 14 days  Patient running a fever or have signs/symptoms of infection? No  Patient currently on antibiotics for the treatment of infection? No  Patient have any upcoming invasive procedures/surgeries? No  Objective: CMP     Component Value Date/Time   NA 139 02/29/2020 0913   K 3.8 02/29/2020 0913   CL 101 02/29/2020 0913   CO2 30 02/29/2020 0913   GLUCOSE 79 02/29/2020 0913   BUN 10 02/29/2020 0913   CREATININE 0.70 02/29/2020 0913   CREATININE 0.72 02/21/2020 0000   CALCIUM 9.8 02/29/2020 0913   PROT 6.5 02/21/2020 0000   ALBUMIN 4.0 11/25/2019 0904   AST 18 02/21/2020 0000   ALT 22 02/21/2020 0000   ALKPHOS 45 11/25/2019 0904   BILITOT 0.3 02/21/2020 0000   GFRNONAA 84 02/21/2020 0000   GFRAA 97 02/21/2020 0000    CBC    Component Value Date/Time   WBC 10.2 02/21/2020 0000   RBC 4.25 02/21/2020 0000   HGB 12.2 02/21/2020 0000   HCT 37.2 02/21/2020 0000   PLT 244 02/21/2020 0000   MCV 87.5 02/21/2020 0000   MCH 28.7 02/21/2020 0000   MCHC 32.8 02/21/2020 0000   RDW 13.9 02/21/2020 0000   LYMPHSABS 2,805 02/21/2020 0000   MONOABS 0.7 03/23/2019 1000   EOSABS 357 02/21/2020 0000   BASOSABS 71 02/21/2020 0000    Baseline Immunosuppressant Therapy Labs TB GOLD Quantiferon TB Gold Latest Ref Rng & Units 02/21/2020  Quantiferon TB Gold Plus NEGATIVE NEGATIVE   Hepatitis Panel Hepatitis Latest Ref Rng & Units 12/24/2018  Hep B Surface Ag NON-REACTI NON-REACTIVE  Hep B IgM NON-REACTI NON-REACTIVE  Hep C Ab NON-REACTI NON-REACTIVE  Hep C Ab NON-REACTI NON-REACTIVE   HIV Lab Results  Component Value Date   HIV NON-REACTIVE 12/24/2018   Immunoglobulins Immunoglobulin Electrophoresis Latest Ref Rng & Units 12/24/2018  IgA  70 - 320 mg/dL 260  IgG 600 - 1,540 mg/dL 859  IgM 50 - 300 mg/dL 175   SPEP Serum Protein  Electrophoresis Latest Ref Rng & Units 02/21/2020  Total Protein 6.1 - 8.1 g/dL 6.5  Albumin 3.8 - 4.8 g/dL -  Alpha-1 0.2 - 0.3 g/dL -  Alpha-2 0.5 - 0.9 g/dL -  Beta Globulin 0.4 - 0.6 g/dL -  Beta 2 0.2 - 0.5 g/dL -  Gamma Globulin 0.8 - 1.7 g/dL -   G6PD Lab Results  Component Value Date   G6PDH 16.5 12/24/2018   TPMT No results found for: TPMT   Chest x-ray: 05/2017  Assessment/Plan:  Demonstrated proper injection technique with Humira demo device  Patient able to demonstrate proper injection technique using the teach back method.  Patient self injected in the right thigh with:  Sample Medication: Humira 40mg /0.41mL NDC: [address]-53 Lot: [id number] Expiration: 04/2021  Patient tolerated well.  Observed for 30 mins in office for adverse reaction and allergic response.   Patient is to return in 1 month for labs and 6-8 weeks for follow-up appointment. Standing orders placed. Patient advised to get labs prior to appt with Dr. Estanislado Pandy.  Humira approved through insurance.  [facility] with copay card information.  Patient is to stop hydroxychloroquine per last clinic visit with [name], PA-C.  All questions encouraged and answered.  Instructed patient to call with any further questions or concerns.  Next appointment  with Dr. Estanislado Pandy on 04/18/20  Knox Saliva, PharmD, MPH Clinical Pharmacist (Rheumatology and Pulmonology)  03/09/2020 9:45 AM

## 2020-03-09 NOTE — Patient Instructions (Addendum)
Next Humira dose will be: 12/23, 1/6, 1/20 then every 2 weeks thereafter  [facility] phone number: [phone]  We will place dermatology referral.  Standing Labs We placed an order today for your standing lab work.   Please have your standing labs drawn in 1 months, then every 3 months  If possible, please have your labs drawn 2 weeks prior to your appointment so that the provider can discuss your results at your appointment.  We have open lab daily Monday through Thursday from 8:30-12:30 PM and 1:30-4:30 PM and Friday from 8:30-12:30 PM and 1:30-4:00 PM at the office of Dr. Bo Merino, [facility].   Please be advised, patients with office appointments requiring lab work will take precedents over walk-in lab work.  If possible, please come for your lab work on Monday and Friday afternoons, as you may experience shorter wait times. The office is located at [address] [place], [place], [place] [address] No appointment is necessary.   Labs are drawn by [facility]. Please bring your co-pay at the time of your lab draw.  You may receive a bill from [facility] for your lab work.  If you wish to have your labs drawn at another location, please call the office 24 hours in advance to send orders.  If you have any questions regarding directions or hours of operation,  please call [phone].   As a reminder, please drink plenty of water prior to coming for your lab work. Thanks!

## 2020-03-10 ENCOUNTER — Ambulatory Visit: Payer: Medicare Other | Admitting: Physician Assistant

## 2020-03-11 ENCOUNTER — Encounter: Payer: Self-pay | Admitting: Orthopedic Surgery

## 2020-03-11 NOTE — Progress Notes (Signed)
Office Visit Note   Patient: Marissa Roberts           Date of Birth: 01-Mar-1946           MRN: [id number] Visit Date: 03/08/2020 Requested by: Marissa Koch, MD [address] [place],   [id number] PCP: Marissa Koch, MD  Subjective: Chief Complaint  Patient presents with  . Left Knee - Pain    HPI: Marissa Roberts is a 74 year old patient underwent left total knee replacement for 121.  She has had a small avulsion fracture off the inferior pole of the patella but her extensor mechanism continues to work.  No history of trauma with that.  She does notice the onset of pain.  She does take calcium and vitamin D.  She has rheumatoid arthritis.  Denies any fevers or chills with the left knee.  States she may be starting to take Humira in the near future.              ROS: All systems reviewed are negative as they relate to the chief complaint within the history of present illness.  Patient denies  fevers or chills.   Assessment & Plan: Visit Diagnoses:  1. Left knee pain, unspecified chronicity     Plan: Impression is left knee small inferior pole patellar avulsion fracture with competency of the extensor mechanism.  Fracture remains unchanged in appearance compared to radiographs from approximately a month ago.  Plan is to see if this can improve on its own.  No indication for operative intervention at this time.  The only operative intervention that could be considered would be fragment excision and side to side repair of the remaining patellar tendon; however, I would only do that as a last resort due to the risk of introducing infection into an otherwise well-functioning knee replacement.  If she is not improving in 3 months time then we can consider that but for now I would just have her continue to work on range of motion and strengthening exercises which are not painful for her.  No radiographic or clinical evidence of stress reaction or loosening of the implants of the  total knee prosthesis.  Follow-Up Instructions: Return in about 3 months (around 06/06/2020).   Orders:  Orders Placed This Encounter  Procedures  . XR Knee 1-2 Views Left   No orders of the defined types were placed in this encounter.     Procedures: No procedures performed   Clinical Data: No additional findings.  Objective: Vital Signs: There were no vitals taken for this visit.  Physical Exam:   Constitutional: Patient appears well-developed HEENT:  Head: Normocephalic Eyes:EOM are normal Neck: Normal range of motion Cardiovascular: Normal rate Pulmonary/chest: Effort normal Neurologic: Patient is alert Skin: Skin is warm Psychiatric: Patient has normal mood and affect    Ortho Exam: Ortho exam demonstrates full active and passive range of motion ankle and hip.  Extensor mechanism is intact with no extensor lag.  Range of motion easily past 90 degrees.  No effusion in the knee joint.  Does have some tenderness at the inferior pole of the patella as well as around the lateral tibial plateau.  Collaterals are stable to varus valgus stress at zero 30 and 90 degrees.  Specialty Comments:  No specialty comments available.  Imaging: No results found.   PMFS History: Patient Active Problem List   Diagnosis Date Noted  . Rash 10/11/2019  . Arthritis of left knee 07/01/2019  . B12  deficiency 03/23/2019  . Anemia 02/05/2019  . Rheumatoid arthritis with rheumatoid factor of multiple sites without organ or systems involvement ([place]) 01/25/2019  . High risk medication use 01/25/2019  . Fatigue 09/18/2018  . Routine general medical examination at a health care facility 02/07/2015  . Morbid obesity ([place]) 04/19/2013  . Hyperlipidemia associated with type 2 diabetes mellitus ([place]) 07/28/2009  . Osteopenia 03/14/2009  . Diabetes mellitus type 2 with complications ([place]) 09/47/0962  . Hypothyroidism 09/24/2006  . Essential hypertension 09/24/2006   Past Medical History:   Diagnosis Date  . Arthritis   . Bronchitis    hx of  . Diabetes mellitus   . Family history of adverse reaction to anesthesia    per patient, "daughter threw up after a knee surgery"  . GERD (gastroesophageal reflux disease)   . Hypercholesteremia   . Hypertension   . Hypothyroidism   . Pneumonia    hx of  . PONV (postoperative nausea and vomiting)   . Thyroid disease     Family History  Problem Relation Age of Onset  . Diabetes Mother   . Heart disease Mother   . Hyperlipidemia Mother   . Diabetes Father   . Diabetes Sister   . Hypertension Sister   . Heart disease Sister   . Dementia Sister   . Heart disease Brother   . Healthy Daughter   . Healthy Daughter   . Healthy Daughter   . Healthy Son     Past Surgical History:  Procedure Laterality Date  . ABDOMINAL HYSTERECTOMY     partial  . BREAST SURGERY     lumpectomy left breast  . carpel tunnel    . COLONOSCOPY W/ POLYPECTOMY    . FOOT SURGERY    . knee arhtroscopy    . KNEE ARTHROPLASTY    . SHOULDER ARTHROSCOPY WITH SUBACROMIAL DECOMPRESSION, ROTATOR CUFF REPAIR AND BICEP TENDON REPAIR Right 05/26/2012   Procedure: Right Shoulder Diagnostic Operative Arthroscopy, Subacromial Decompression, Biceps Tenodesis, Mini Open Rotator Cuff Repair;  Surgeon: [name], MD;  Location: [facility];  Service: Orthopedics;  Laterality: Right;  Right Shoulder Diagnostic Operative Arthroscopy, Subacromial Decompression, Biceps Tenodesis, Mini Open Rotator Cuff Repair  . TONSILLECTOMY    . TOTAL KNEE ARTHROPLASTY Left 07/01/2019   Procedure: LEFT TOTAL KNEE ARTHROPLASTY;  Surgeon: [name], MD;  Location: [facility];  Service: Orthopedics;  Laterality: Left;   Social History   Occupational History  . Not on file  Tobacco Use  . Smoking status: Former Smoker    Quit date: 04/01/1990    Years since quitting: 29.9  . Smokeless tobacco: Never Used  Vaping Use  . Vaping Use: Never used  Substance and Sexual Activity  .  Alcohol use: Not Currently    Comment: 06/22/2019: per patient about 20-25 years ago  . Drug use: No  . Sexual activity: Never    Birth control/protection: Abstinence

## 2020-03-13 ENCOUNTER — Other Ambulatory Visit: Payer: Self-pay | Admitting: Endocrinology

## 2020-03-14 ENCOUNTER — Telehealth: Payer: Self-pay | Admitting: Pharmacist

## 2020-03-14 NOTE — Telephone Encounter (Signed)
Returned Ms. Buckle's call regarding Humira prescription. I called [facility] and provided them copay card information. They were able to run her prescription through card for $5. Left VM with [facility] phone number.  Knox Saliva, PharmD, MPH Clinical Pharmacist (Rheumatology and Pulmonology)

## 2020-03-20 ENCOUNTER — Encounter: Payer: Self-pay | Admitting: Podiatry

## 2020-03-20 ENCOUNTER — Other Ambulatory Visit: Payer: Self-pay

## 2020-03-20 ENCOUNTER — Ambulatory Visit (INDEPENDENT_AMBULATORY_CARE_PROVIDER_SITE_OTHER): Payer: BC Managed Care – PPO | Admitting: Podiatry

## 2020-03-20 DIAGNOSIS — M79675 Pain in left toe(s): Secondary | ICD-10-CM

## 2020-03-20 DIAGNOSIS — B351 Tinea unguium: Secondary | ICD-10-CM

## 2020-03-20 DIAGNOSIS — M79674 Pain in right toe(s): Secondary | ICD-10-CM

## 2020-03-20 DIAGNOSIS — E119 Type 2 diabetes mellitus without complications: Secondary | ICD-10-CM

## 2020-03-20 DIAGNOSIS — Z794 Long term (current) use of insulin: Secondary | ICD-10-CM | POA: Diagnosis not present

## 2020-03-20 DIAGNOSIS — L84 Corns and callosities: Secondary | ICD-10-CM

## 2020-03-20 NOTE — Progress Notes (Signed)
Subjective: Marissa Roberts presents today preventative diabetic foot care and painful corn(s) b/l feet and painful mycotic toenails b/l that are difficult to trim. Pain interferes with ambulation. Aggravating factors include wearing enclosed shoe gear. Pain is relieved with periodic professional debridement.   She voices no new pedal problems on today's visit.  [name], MD is patient's PCP. Last visit was: 10/11/2019.  Past Medical History:  Diagnosis Date  . Arthritis   . Bronchitis    hx of  . Diabetes mellitus   . Family history of adverse reaction to anesthesia    per patient, "daughter threw up after a knee surgery"  . GERD (gastroesophageal reflux disease)   . Hypercholesteremia   . Hypertension   . Hypothyroidism   . Pneumonia    hx of  . PONV (postoperative nausea and vomiting)   . Thyroid disease      Current Outpatient Medications on File Prior to Visit  Medication Sig Dispense Refill  . Acetaminophen (TYLENOL PO) Take by mouth as needed.    . Adalimumab (HUMIRA PEN) 40 MG/0.4ML PNKT Inject 40 mg into the skin every 14 (fourteen) days. 6 each 0  . amLODipine (NORVASC) 5 MG tablet Take 1 tablet (5 mg total) by mouth daily. 90 tablet 3  . BD PEN NEEDLE NANO U/F 32G X 4 MM MISC USE  4 TIMES DAILY TO  INJECT  INSULIN 200 each 3  . Biotin 10000 MCG TABS Take 10,000 mcg by mouth daily.    . calcium-vitamin D (OSCAL WITH D) 500-200 MG-UNIT tablet Take 2 tablets by mouth daily with breakfast.    . cephALEXin (KEFLEX) 500 MG capsule Take 4 capsules (2,000 mg) 30-60 minutes prior to dental procedure 8 capsule 0  . dapagliflozin propanediol (FARXIGA) 5 MG TABS tablet Take 1 tablet (5 mg total) by mouth daily. 90 tablet 3  . Ferrous Sulfate (IRON PO) Take by mouth daily.    . fluticasone (FLONASE) 50 MCG/ACT nasal spray Place 2 sprays into both nostrils 2 (two) times daily as needed for allergies or rhinitis.    . folic acid (FOLVITE) 1 MG tablet Take 2 tablets by  mouth once daily 180 tablet 0  . furosemide (LASIX) 40 MG tablet Take 2 tablets by mouth once daily 180 tablet 1  . Glucosamine HCl (GLUCOSAMINE PO) Take 1 capsule by mouth daily.     Marland Kitchen glucose blood (ONETOUCH VERIO) test strip Use Onetouch verio test strips as instructed to check blood sugars 4 times daily. DX:E11.65 400 each 1  . hydroxychloroquine (PLAQUENIL) 200 MG tablet Take 1 tablet (200 mg total) by mouth 2 (two) times daily. 180 tablet 0  . Lancets (ONETOUCH DELICA PLUS MPNTIR44R) MISC USE TO CHECK BLOOD SUGAR UP TO THREE TIMES A DAY 2 HOURS AFTER A MEAL AND UP TO 3 TIMES A WEEK UPON WAKING UP 300 each 0  . leflunomide (ARAVA) 20 MG tablet Take 1 tablet (20 mg total) by mouth daily. 90 tablet 0  . levothyroxine (SYNTHROID) 137 MCG tablet Take 1 tablet (137 mcg total) by mouth daily. 90 tablet 0  . linaclotide (LINZESS) 290 MCG CAPS capsule Take 1 capsule (290 mcg total) by mouth daily as needed. 90 capsule 3  . metFORMIN (GLUCOPHAGE) 1000 MG tablet TAKE 1 TABLET BY MOUTH TWICE DAILY WITH A MEAL 180 tablet 0  . methocarbamol (ROBAXIN) 500 MG tablet Take 1 tablet (500 mg total) by mouth every 8 (eight) hours as needed for muscle spasms. [place]  tablet 0  . montelukast (SINGULAIR) 10 MG tablet Take 1 tablet (10 mg total) by mouth at bedtime. 90 tablet 0  . Multiple Vitamin (MULTIVITAMIN WITH MINERALS) TABS tablet Take 1 tablet by mouth daily.    Marland Kitchen NOVOLOG FLEXPEN 100 UNIT/ML FlexPen INJECT 10-15 UNITS SUBCUTANEOUSLY THREE TIMES DAILY BEFORE MEAL(S) 15 mL 1  . potassium chloride (KLOR-CON) 10 MEQ tablet Take 1 tablet by mouth twice daily 180 tablet 0  . rosuvastatin (CRESTOR) 40 MG tablet Take 1 tablet (40 mg total) by mouth daily. 90 tablet 3  . TRESIBA FLEXTOUCH 200 UNIT/ML FlexTouch Pen INJECT 36 UNITS SUBCUTANEOUSLY IN THE MORNING 9 mL 0  . valsartan-hydrochlorothiazide (DIOVAN-HCT) 320-12.5 MG tablet Take 1 tablet by mouth once daily 90 tablet 0  . [DISCONTINUED] Saxagliptin-Metformin 07-998  MG TB24 Take 1 tablet by mouth daily. 30 tablet    No current facility-administered medications on file prior to visit.     Allergies  Allergen Reactions  . Contrast Media [Iodinated Diagnostic Agents]     Throat swells from ct con tra  . Adhesive [Tape]     No paper tape, causes blisters  . Erythromycin Rash  . Penicillins Rash    Did it involve swelling of the face/tongue/throat, SOB, or low BP? No Did it involve sudden or severe rash/hives, skin peeling, or any reaction on the inside of your mouth or nose? No Did you need to seek medical attention at a hospital or doctor's office? No When did it last happen?Many years If all above answers are "NO", may proceed with cephalosporin use.     Objective: Marissa Roberts is a pleasant 74 y.o. African American female, WD, WN in NAD. AAO x 3.  There were no vitals filed for this visit.  Vascular Examination: Neurovascular status unchanged b/l lower extremities. Capillary fill time to digits <3 seconds b/l lower extremities. Palpable DP pulses b/l. Palpable PT pulses b/l. Pedal hair sparse b/l. Skin temperature gradient within normal limits b/l.  Dermatological Examination: Pedal skin with normal turgor, texture and tone bilaterally. No open wounds bilaterally. No interdigital macerations bilaterally. Toenails 1-5 b/l elongated, discolored, dystrophic, thickened, crumbly with subungual debris and tenderness to dorsal palpation. Hyperkeratotic lesion(s) L hallux and R hallux.  No erythema, no edema, no drainage, no fluctuance.   Musculoskeletal: Normal muscle strength 5/5 to all lower extremity muscle groups bilaterally. No pain crepitus or joint limitation noted with ROM b/l. Hammertoes noted to the L 5th toe and R 5th toe. Patient ambulates independent of any assistive aids.  Neurological Examination: Protective sensation intact 5/5 intact bilaterally with 10g monofilament b/l.  Assessment: 1. Pain due to onychomycosis of  toenails of both feet   2. Callus   3. Type 2 diabetes mellitus without complication, with long-term current use of insulin ([place])      Plan: -Examined patient. -No new findings. No new orders. -Continue diabetic foot care principles. -Toenails 1-5 b/l were debrided in length and girth with sterile nail nippers and dremel without iatrogenic bleeding.  -Callus(es) L hallux and R hallux pared utilizing sterile scalpel blade without complication or incident. Total number debrided =2. -Patient to report any pedal injuries to medical professional immediately. -Patient/POA to call should there be question/concern in the interim.  Return in about 3 months (around 06/18/2020) for diabetic nail and callus trim.  Marzetta Board, DPM

## 2020-03-22 ENCOUNTER — Other Ambulatory Visit: Payer: Self-pay

## 2020-03-22 ENCOUNTER — Encounter: Payer: Self-pay | Admitting: Internal Medicine

## 2020-03-22 ENCOUNTER — Ambulatory Visit (INDEPENDENT_AMBULATORY_CARE_PROVIDER_SITE_OTHER): Payer: Medicare Other | Admitting: Internal Medicine

## 2020-03-22 VITALS — BP 144/86 | HR 72 | Temp 98.0°F | Ht 65.0 in | Wt 238.0 lb

## 2020-03-22 DIAGNOSIS — M0579 Rheumatoid arthritis with rheumatoid factor of multiple sites without organ or systems involvement: Secondary | ICD-10-CM | POA: Diagnosis not present

## 2020-03-22 DIAGNOSIS — T8484XA Pain due to internal orthopedic prosthetic devices, implants and grafts, initial encounter: Secondary | ICD-10-CM | POA: Diagnosis not present

## 2020-03-22 DIAGNOSIS — E118 Type 2 diabetes mellitus with unspecified complications: Secondary | ICD-10-CM

## 2020-03-22 DIAGNOSIS — Z96659 Presence of unspecified artificial knee joint: Secondary | ICD-10-CM

## 2020-03-22 NOTE — Assessment & Plan Note (Signed)
Weight is up and this will likely impact her mobility and this could cause worsening weight.

## 2020-03-22 NOTE — Progress Notes (Signed)
   Subjective:   Patient ID: Marissa Roberts, female    DOB: December 01, 1945, 74 y.o.   MRN: [id number]  HPI The patient is a 74 YO female coming in for concerns about left knee pain s/p replacement (pain ongoing and there is a small fracture inferior patella which orthopedics is monitoring, she is not taking anything for pain, this is limiting mobility and function, feels that this is causing her to gain weight) and RA (recently started humira pen, knows that this needs 3 months or so to get in her system, seeing rheumatology, has had 3 covid shots) and blood pressure (runs mildly high, has had some poor reactions with medications in the past, taking amlodipine and valsartan/hctz currently, denies side effects, denies chest pains or headaches) and diabetes (seeing endocrinology, denies overt low sugars, sometimes low normal, she is taking farxiga and metformin and novolog and tresiba, last HgA1c 5.7 and consistent at or below 6 for the last year).   Review of Systems  Constitutional: Positive for activity change and unexpected weight change. Negative for appetite change and fatigue.  HENT: Negative.   Eyes: Negative.   Respiratory: Negative for cough, chest tightness and shortness of breath.   Cardiovascular: Negative for chest pain, palpitations and leg swelling.  Gastrointestinal: Negative for abdominal distention, abdominal pain, constipation, diarrhea, nausea and vomiting.  Musculoskeletal: Positive for arthralgias, gait problem and myalgias.  Skin: Negative.   Psychiatric/Behavioral: Negative.     Objective:  Physical Exam Constitutional:      Appearance: She is well-developed and well-nourished. She is obese.  HENT:     Head: Normocephalic and atraumatic.  Eyes:     Extraocular Movements: EOM normal.  Cardiovascular:     Rate and Rhythm: Normal rate and regular rhythm.  Pulmonary:     Effort: Pulmonary effort is normal. No respiratory distress.     Breath sounds: Normal breath  sounds. No wheezing or rales.  Abdominal:     General: Bowel sounds are normal. There is no distension.     Palpations: Abdomen is soft.     Tenderness: There is no abdominal tenderness. There is no rebound.  Musculoskeletal:        General: Tenderness present. No edema.     Cervical back: Normal range of motion.     Comments: Pain inferior left knee, scar intact and well healed  Skin:    General: Skin is warm and dry.  Neurological:     Mental Status: She is alert and oriented to person, place, and time.     Coordination: Coordination abnormal.     Comments: Slow to stand and slow gait  Psychiatric:        Mood and Affect: Mood and affect normal.     Vitals:   03/22/20 0852  BP: (!) 152/84  Pulse: 72  Temp: 98 F (36.7 C)  TempSrc: Oral  SpO2: 98%  Weight: 238 lb (108 kg)  Height: 5\' 5"  (1.651 m)    This visit occurred during the SARS-CoV-2 public health emergency.  Safety protocols were in place, including screening questions prior to the visit, additional usage of staff PPE, and extensive cleaning of exam room while observing appropriate contact time as indicated for disinfecting solutions.   Assessment & Plan:

## 2020-03-22 NOTE — Assessment & Plan Note (Signed)
Working with orthopedics and this is limiting QOL and function.

## 2020-03-22 NOTE — Assessment & Plan Note (Signed)
Advised she needs to check sugars if feeling bad, consider reduction in therapy as her diabetes appears to be over treated. She is feeling poorly if delayed on meals and some concern for low or low normal sugars. I would consider she ask endocrinologist to reduce novolog to with largest meal only potentially. Morning sugars around 100-110 so at goal.

## 2020-03-22 NOTE — Assessment & Plan Note (Signed)
Recent start of humira and she will monitor for improvement in symptoms and continue to follow up with rheumatology.

## 2020-03-22 NOTE — Patient Instructions (Signed)
We do not need labs today. Think about asking Dr. Dwyane Dee about reducing the meal time insulin to just with the largest meal of the day.   You can use the voltaren on the left knee also for pain.

## 2020-04-03 ENCOUNTER — Other Ambulatory Visit: Payer: Self-pay | Admitting: Rheumatology

## 2020-04-03 ENCOUNTER — Other Ambulatory Visit: Payer: Self-pay | Admitting: Endocrinology

## 2020-04-03 NOTE — Telephone Encounter (Signed)
Last Visit: 02/21/2020 Next Visit: 04/18/2020 Labs: 02/21/2020 Glucose is 125. Rest of CMP WNL and CBC WNL  Current Dose per office note 02/21/2020: Arava 20mg  po qd.  DX: Rheumatoid arthritis involving multiple sites with positive rheumatoid factor   Okay to refill per Dr. 

## 2020-04-04 NOTE — Progress Notes (Signed)
Office Visit Note  Patient: Marissa Roberts             Date of Birth: [birth year]           MRN: [id number]             PCP: [name], MD Referring: [name], * Visit Date: 04/18/2020 Occupation: @GUAROCC @  Subjective:  Right knee pooping.   History of Present Illness: Marissa Roberts is a 76 y.o. female with history of rheumatoid arthritis and osteoarthritis.  She states she has been tolerating Humira and Arava combination well.  She denies any joint swelling.  She has some popping sensation in her right knee joint.  She is also noticed a rash on her left knee.  None of the other joints are painful.  Activities of Daily Living:  Patient reports morning stiffness for 0 minutes.   Patient Denies nocturnal pain.  Difficulty dressing/grooming: Denies Difficulty climbing stairs: Denies Difficulty getting out of chair: Reports Difficulty using hands for taps, buttons, cutlery, and/or writing: Reports  Review of Systems  Constitutional: Negative for fatigue.  HENT: Negative for mouth sores, mouth dryness and nose dryness.   Eyes: Negative for pain, itching, visual disturbance and dryness.  Respiratory: Negative for cough, hemoptysis, shortness of breath and difficulty breathing.   Cardiovascular: Positive for swelling in legs/feet. Negative for chest pain and palpitations.  Gastrointestinal: Positive for constipation. Negative for abdominal pain, blood in stool and diarrhea.  Endocrine: Negative for increased urination.  Genitourinary: Negative for painful urination.  Musculoskeletal: Positive for arthralgias and joint pain. Negative for joint swelling, myalgias, muscle weakness, morning stiffness, muscle tenderness and myalgias.  Skin: Positive for rash. Negative for color change and redness.  Allergic/Immunologic: Negative for susceptible to infections.  Neurological: Negative for dizziness, numbness, headaches, memory loss and weakness.   Hematological: Negative for swollen glands.  Psychiatric/Behavioral: Negative for confusion and sleep disturbance.    PMFS History:  Patient Active Problem List   Diagnosis Date Noted  . Pain in knee region after total knee replacement ([place]) 03/22/2020  . Rash 10/11/2019  . Arthritis of left knee 07/01/2019  . Hypercholesterolemia 04/20/2019  . B12 deficiency 03/23/2019  . Anemia 02/05/2019  . Rheumatoid arthritis with rheumatoid factor of multiple sites without organ or systems involvement ([place]) 01/25/2019  . High risk medication use 01/25/2019  . Fatigue 09/18/2018  . Routine general medical examination at a health care facility 02/07/2015  . Morbid obesity ([place]) 04/19/2013  . Hyperlipidemia associated with type 2 diabetes mellitus ([place]) 07/28/2009  . Osteopenia 03/14/2009  . Diabetes mellitus type 2 with complications ([place]) 0000000  . Hypothyroidism 09/24/2006  . Essential hypertension 09/24/2006    Past Medical History:  Diagnosis Date  . Arthritis   . Bronchitis    hx of  . Diabetes mellitus   . Family history of adverse reaction to anesthesia    per patient, "daughter threw up after a knee surgery"  . GERD (gastroesophageal reflux disease)   . Hypercholesteremia   . Hypertension   . Hypothyroidism   . Pneumonia    hx of  . PONV (postoperative nausea and vomiting)   . Thyroid disease     Family History  Problem Relation Age of Onset  . Diabetes Mother   . Heart disease Mother   . Hyperlipidemia Mother   . Diabetes Father   . Diabetes Sister   . Hypertension Sister   . Heart disease Sister   . Dementia Sister   .  Heart disease Brother   . Healthy Daughter   . Healthy Daughter   . Healthy Daughter   . Healthy Son    Past Surgical History:  Procedure Laterality Date  . ABDOMINAL HYSTERECTOMY     partial  . BREAST SURGERY     lumpectomy left breast  . carpel tunnel    . COLONOSCOPY W/ POLYPECTOMY    . FOOT SURGERY    . knee arhtroscopy    .  KNEE ARTHROPLASTY    . SHOULDER ARTHROSCOPY WITH SUBACROMIAL DECOMPRESSION, ROTATOR CUFF REPAIR AND BICEP TENDON REPAIR Right 05/26/2012   Procedure: Right Shoulder Diagnostic Operative Arthroscopy, Subacromial Decompression, Biceps Tenodesis, Mini Open Rotator Cuff Repair;  Surgeon: Cammy Copa, MD;  Location: [facility] OR;  Service: Orthopedics;  Laterality: Right;  Right Shoulder Diagnostic Operative Arthroscopy, Subacromial Decompression, Biceps Tenodesis, Mini Open Rotator Cuff Repair  . TONSILLECTOMY    . TOTAL KNEE ARTHROPLASTY Left 07/01/2019   Procedure: LEFT TOTAL KNEE ARTHROPLASTY;  Surgeon: Cammy Copa, MD;  Location: [facility] OR;  Service: Orthopedics;  Laterality: Left;   Social History   Social History Narrative  . Not on file   Immunization History  Administered Date(s) Administered  . Fluad Quad(high Dose 65+) 01/02/2019, 12/31/2019  . Influenza Split 01/03/2011  . Influenza Whole 04/01/2005, 12/18/2009  . Influenza, High Dose Seasonal PF 01/07/2014, 01/27/2015, 01/02/2016, 01/13/2017, 01/16/2018  . Influenza-Unspecified 11/30/2012  . PFIZER(Purple Top)SARS-COV-2 Vaccination 05/28/2019, 06/23/2019, 01/15/2020  . Pneumococcal Conjugate-13 09/12/2016, 01/02/2019  . Pneumococcal Polysaccharide-23 07/15/2014, 12/27/2019  . Td 04/01/2001, 10/07/2013     Objective: Vital Signs: BP 117/76 (BP Location: Left Arm, Patient Position: Sitting, Cuff Size: Large)   Pulse 79   Ht 5\' 5"  (1.651 m)   Wt 235 lb (106.6 kg)   BMI 39.11 kg/m    Physical Exam Vitals and nursing note reviewed.  Constitutional:      Appearance: She is well-developed and well-nourished.  HENT:     Head: Normocephalic and atraumatic.  Eyes:     Extraocular Movements: EOM normal.     Conjunctiva/sclera: Conjunctivae normal.  Cardiovascular:     Rate and Rhythm: Normal rate and regular rhythm.     Pulses: Intact distal pulses.     Heart sounds: Normal heart sounds.  Pulmonary:     Effort: Pulmonary  effort is normal.     Breath sounds: Normal breath sounds.  Abdominal:     General: Bowel sounds are normal.     Palpations: Abdomen is soft.  Musculoskeletal:     Cervical back: Normal range of motion.  Lymphadenopathy:     Cervical: No cervical adenopathy.  Skin:    General: Skin is warm and dry.     Capillary Refill: Capillary refill takes less than 2 seconds.     Comments: Noticed rash noted on the left knee.  Neurological:     Mental Status: She is alert and oriented to person, place, and time.  Psychiatric:        Mood and Affect: Mood and affect normal.        Behavior: Behavior normal.      Musculoskeletal Exam: C-spine was in good range of motion.  Shoulder joints, elbow joints were in good range of motion.  She has some limitation of the wrist joints with no synovitis.  There was no MCP or PIP swelling.  Hip joints, knee joints, ankles with good range of motion with no synovitis.  There was no tenderness over MTPs.  Her left  knee joint is replaced.  She has some crepitus in the right knee joint.  CDAI Exam: CDAI Score: 0.4  Patient Global: 2 mm; Provider Global: 2 mm Swollen: 0 ; Tender: 0  Joint Exam 04/18/2020   No joint exam has been documented for this visit   There is currently no information documented on the homunculus. Go to the Rheumatology activity and complete the homunculus joint exam.  Investigation: No additional findings.  Imaging: No results found.  Recent Labs: Lab Results  Component Value Date   WBC 9.3 04/07/2020   HGB 12.2 04/07/2020   PLT 233 04/07/2020   NA 143 04/07/2020   K 4.4 04/07/2020   CL 104 04/07/2020   CO2 31 04/07/2020   GLUCOSE 94 04/07/2020   BUN 14 04/07/2020   CREATININE 0.72 04/07/2020   BILITOT 0.3 04/07/2020   ALKPHOS 45 11/25/2019   AST 15 04/07/2020   ALT 15 04/07/2020   PROT 6.5 04/07/2020   ALBUMIN 4.0 11/25/2019   CALCIUM 9.8 04/07/2020   GFRAA 97 04/07/2020   QFTBGOLDPLUS NEGATIVE 02/21/2020     Speciality Comments: PLQ Eye Exam: 12/22/2019 WNL @ [facility] follow up in 6 months  Methotrexate-hair loss  Procedures:  No procedures performed Allergies: Contrast media [iodinated diagnostic agents], Adhesive [tape], Erythromycin, and Penicillins   Assessment / Plan:     Visit Diagnoses: Rheumatoid arthritis involving multiple sites with positive rheumatoid factor (HCC)-she had no synovitis on examination.  She continues to have some pain in her knee joints.  No synovitis was noted.  She has been tolerating her medications well.  High risk medication use - Humira 40mg  every 14 days, Arava 20mg  po qd. (Discontinued MTX due to hair loss, previously on PLQ).  Her labs in January were normal.  TB Gold on February 21, 2020 was negative.  She will get labs every 3 months to monitor for drug toxicity.  Primary osteoarthritis of both feet-modification in her shoes has been helpful.  S/P total knee replacement, left - Performed by Dr. Marlou Sa on 07/01/19.  Patient states she still continues to have some discomfort in her left knee.  She has some crepitus in her right knee joint.  No warmth or swelling was noted.  Knee joint muscle strengthening exercises were discussed.  A handout on exercises was given.  Rash-she had a rash on her left knee.  I have advised her to use hydrocortisone cream for possible contact dermatitis.  If the symptoms persist she may see her PCP.  Osteopenia, unspecified location - DEXA not in epic.  Use of calcium, vitamin D and resistive exercises was discussed.  History of hypothyroidism  Essential hypertension-her blood pressure is normal today.  Hyperlipidemia associated with type 2 diabetes mellitus ([place])  Diabetes mellitus type 2 with complications ([place])  Orders: No orders of the defined types were placed in this encounter.  No orders of the defined types were placed in this encounter.     Follow-Up Instructions: Return in about 5 months (around  09/16/2020) for Rheumatoid arthritis, Osteoarthritis.   Bo Merino, MD  Note - This record has been created using Editor, commissioning.  Chart creation errors have been sought, but may not always  have been located. Such creation errors do not reflect on  the standard of medical care.

## 2020-04-05 ENCOUNTER — Telehealth: Payer: Self-pay | Admitting: Endocrinology

## 2020-04-05 NOTE — Telephone Encounter (Signed)
Patient wanted to talk to [facility] about her blood pressure and also an exercise class.  Ph# [phone]

## 2020-04-06 ENCOUNTER — Other Ambulatory Visit: Payer: Self-pay | Admitting: *Deleted

## 2020-04-06 MED ORDER — CLONIDINE HCL 0.1 MG PO TABS: ORAL_TABLET | ORAL | 1 refills | Status: DC

## 2020-04-06 NOTE — Telephone Encounter (Signed)
Noted, patients husband is aware, Rx has been sent to [facility] per patient request.

## 2020-04-06 NOTE — Telephone Encounter (Signed)
Please ask her why she stopped her blood pressure medicine.  She should take at least her valsartan after dinner every evening.

## 2020-04-06 NOTE — Telephone Encounter (Signed)
Patient said she saw another Dr. Who told her that her blood pressure was elevated, she gave me the following readings.  Mon- am 155/85       pm 123/80 Tue- am  165/75       pm 138/66 Wed-am  162/77       pm 142/65 Thu-am   161/73  She hasn't been taking bp meds and wanted to know if you could take care of this?   Also [facility], I [facility] [place] [facility]. Please advise

## 2020-04-06 NOTE — Telephone Encounter (Signed)
Correction:  Patient said she is taking her blood pressure medicine, she takes the 5 mg amlodipine and the 320-12.5 Diovan.  She states that She saw Dr. Okey Dupre and it was high, and that one New Years she was very dizzy with a headache and she took an extra amlodipine and it went away.

## 2020-04-06 NOTE — Telephone Encounter (Signed)
Please let her know that we will need to add another blood pressure medicine clonidine to take 0.1 mg at bedtime.  This may cause a little dry mouth

## 2020-04-07 ENCOUNTER — Other Ambulatory Visit: Payer: Self-pay | Admitting: *Deleted

## 2020-04-07 DIAGNOSIS — M0579 Rheumatoid arthritis with rheumatoid factor of multiple sites without organ or systems involvement: Secondary | ICD-10-CM

## 2020-04-07 DIAGNOSIS — Z9225 Personal history of immunosupression therapy: Secondary | ICD-10-CM

## 2020-04-07 DIAGNOSIS — Z79899 Other long term (current) drug therapy: Secondary | ICD-10-CM

## 2020-04-07 DIAGNOSIS — Z111 Encounter for screening for respiratory tuberculosis: Secondary | ICD-10-CM

## 2020-04-07 LAB — CBC WITH DIFFERENTIAL/PLATELET
Absolute Monocytes: 781 cells/uL (ref 200–950)
Basophils Absolute: 47 cells/uL (ref 0–200)
Basophils Relative: 0.5 %
Eosinophils Absolute: 270 cells/uL (ref 15–500)
Eosinophils Relative: 2.9 %
HCT: 37.5 % (ref 35.0–45.0)
Hemoglobin: 12.2 g/dL (ref 11.7–15.5)
Lymphs Abs: 3943 cells/uL — ABNORMAL HIGH (ref 850–3900)
MCH: 28 pg (ref 27.0–33.0)
MCHC: 32.5 g/dL (ref 32.0–36.0)
MCV: 86.2 fL (ref 80.0–100.0)
MPV: 12.1 fL (ref 7.5–12.5)
Monocytes Relative: 8.4 %
Neutro Abs: 4259 cells/uL (ref 1500–7800)
Neutrophils Relative %: 45.8 %
Platelets: 233 10*3/uL (ref 140–400)
RBC: 4.35 10*6/uL (ref 3.80–5.10)
RDW: 13.7 % (ref 11.0–15.0)
Total Lymphocyte: 42.4 %
WBC: 9.3 10*3/uL (ref 3.8–10.8)

## 2020-04-07 LAB — COMPLETE METABOLIC PANEL WITH GFR
AG Ratio: 1.6 (calc) (ref 1.0–2.5)
ALT: 15 U/L (ref 6–29)
AST: 15 U/L (ref 10–35)
Albumin: 4 g/dL (ref 3.6–5.1)
Alkaline phosphatase (APISO): 50 U/L (ref 37–153)
BUN: 14 mg/dL (ref 7–25)
CO2: 31 mmol/L (ref 20–32)
Calcium: 9.8 mg/dL (ref 8.6–10.4)
Chloride: 104 mmol/L (ref 98–110)
Creat: 0.72 mg/dL (ref 0.60–0.93)
GFR, Est African American: 97 mL/min/{1.73_m2} (ref >=60)
GFR, Est Non African American: 84 mL/min/{1.73_m2} (ref >=60)
Globulin: 2.5 g/dL (calc) (ref 1.9–3.7)
Glucose, Bld: 94 mg/dL (ref 65–99)
Potassium: 4.4 mmol/L (ref 3.5–5.3)
Sodium: 143 mmol/L (ref 135–146)
Total Bilirubin: 0.3 mg/dL (ref 0.2–1.2)
Total Protein: 6.5 g/dL (ref 6.1–8.1)

## 2020-04-10 ENCOUNTER — Other Ambulatory Visit: Payer: Self-pay | Admitting: Endocrinology

## 2020-04-14 ENCOUNTER — Telehealth: Payer: Self-pay | Admitting: Radiology

## 2020-04-14 NOTE — Telephone Encounter (Signed)
Spoke with patient, advised okay to take the prophylactic antibiotics for a normal dental cleaning and okay to continue Humira as prescribed.   If she develops signs or symptoms of an infection please advise the patient to stop humira and arava until the infection has cleared. Patient expressed understanding.

## 2020-04-14 NOTE — Telephone Encounter (Signed)
If she is taking the prophylactic antibiotics for a normal dental cleaning it is okay to continue Humira as prescribed.   If she develops signs or symptoms of an infection please advise the patient to stop humira and arava until the infection has cleared.

## 2020-04-14 NOTE — Telephone Encounter (Signed)
Spoke with patient, she has a dentist appointment 04/25/2020 and will take an antibiotic, Cephalexin, prior to the appointment. Patient is currently on Humira every 2 weeks - next dose is due on 04/20/2020. Will patient need to hold Humira or change administration because of the antibiotic? Please advise.

## 2020-04-18 ENCOUNTER — Other Ambulatory Visit: Payer: Self-pay

## 2020-04-18 ENCOUNTER — Encounter: Payer: Self-pay | Admitting: Rheumatology

## 2020-04-18 ENCOUNTER — Ambulatory Visit (INDEPENDENT_AMBULATORY_CARE_PROVIDER_SITE_OTHER): Payer: Medicare Other | Admitting: Rheumatology

## 2020-04-18 ENCOUNTER — Telehealth: Payer: Self-pay

## 2020-04-18 VITALS — BP 117/76 | HR 79 | Ht 65.0 in | Wt 235.0 lb

## 2020-04-18 DIAGNOSIS — E118 Type 2 diabetes mellitus with unspecified complications: Secondary | ICD-10-CM

## 2020-04-18 DIAGNOSIS — M19072 Primary osteoarthritis, left ankle and foot: Secondary | ICD-10-CM

## 2020-04-18 DIAGNOSIS — Z79899 Other long term (current) drug therapy: Secondary | ICD-10-CM | POA: Diagnosis not present

## 2020-04-18 DIAGNOSIS — Z96652 Presence of left artificial knee joint: Secondary | ICD-10-CM

## 2020-04-18 DIAGNOSIS — M19071 Primary osteoarthritis, right ankle and foot: Secondary | ICD-10-CM | POA: Diagnosis not present

## 2020-04-18 DIAGNOSIS — M0579 Rheumatoid arthritis with rheumatoid factor of multiple sites without organ or systems involvement: Secondary | ICD-10-CM | POA: Diagnosis not present

## 2020-04-18 DIAGNOSIS — E1169 Type 2 diabetes mellitus with other specified complication: Secondary | ICD-10-CM

## 2020-04-18 DIAGNOSIS — Z8639 Personal history of other endocrine, nutritional and metabolic disease: Secondary | ICD-10-CM

## 2020-04-18 DIAGNOSIS — M8580 Other specified disorders of bone density and structure, unspecified site: Secondary | ICD-10-CM

## 2020-04-18 DIAGNOSIS — R21 Rash and other nonspecific skin eruption: Secondary | ICD-10-CM

## 2020-04-18 DIAGNOSIS — I10 Essential (primary) hypertension: Secondary | ICD-10-CM

## 2020-04-18 DIAGNOSIS — E785 Hyperlipidemia, unspecified: Secondary | ICD-10-CM

## 2020-04-18 NOTE — Patient Instructions (Addendum)
Standing Labs We placed an order today for your standing lab work.   Please have your standing labs drawn in April and every 3 months   If possible, please have your labs drawn 2 weeks prior to your appointment so that the provider can discuss your results at your appointment.  We have open lab daily Monday through Thursday from 8:30-12:30 PM and 1:30-4:30 PM and Friday from 8:30-12:30 PM and 1:30-4:00 PM at the office of Dr. Japheth Diekman, [facility].   Please be advised, patients with office appointments requiring lab work will take precedents over walk-in lab work.  If possible, please come for your lab work on Monday and Friday afternoons, as you may experience shorter wait times. The office is located at [address], [address], [place], [place] [address] No appointment is necessary.   Labs are drawn by [facility]. Please bring your co-pay at the time of your lab draw.  You may receive a bill from [facility] for your lab work.  If you wish to have your labs drawn at another location, please call the office 24 hours in advance to send orders.  If you have any questions regarding directions or hours of operation,  please call [phone].   As a reminder, please drink plenty of water prior to coming for your lab work. Thanks!    Journal for Nurse Practitioners, 15(4), 263-267. Retrieved January 05, 2018 from http://clinicalkey.com/nursing">  Knee Exercises Ask your health care provider which exercises are safe for you. Do exercises exactly as told by your health care provider and adjust them as directed. It is normal to feel mild stretching, pulling, tightness, or discomfort as you do these exercises. Stop right away if you feel sudden pain or your pain gets worse. Do not begin these exercises until told by your health care provider. Stretching and range-of-motion exercises These exercises warm up your muscles and joints and improve the movement and flexibility of your knee.  These exercises also help to relieve pain and swelling. Knee extension, prone 1. Lie on your abdomen (prone position) on a bed. 2. Place your left / right knee just beyond the edge of the surface so your knee is not on the bed. You can put a towel under your left / right thigh just above your kneecap for comfort. 3. Relax your leg muscles and allow gravity to straighten your knee (extension). You should feel a stretch behind your left / right knee. 4. Hold this position for __________ seconds. 5. Scoot up so your knee is supported between repetitions. Repeat __________ times. Complete this exercise __________ times a day. Knee flexion, active 1. Lie on your back with both legs straight. If this causes back discomfort, bend your left / right knee so your foot is flat on the floor. 2. Slowly slide your left / right heel back toward your buttocks. Stop when you feel a gentle stretch in the front of your knee or thigh (flexion). 3. Hold this position for __________ seconds. 4. Slowly slide your left / right heel back to the starting position. Repeat __________ times. Complete this exercise __________ times a day.   Quadriceps stretch, prone 1. Lie on your abdomen on a firm surface, such as a bed or padded floor. 2. Bend your left / right knee and hold your ankle. If you cannot reach your ankle or pant leg, loop a belt around your foot and grab the belt instead. 3. Gently pull your heel toward your buttocks. Your knee should   not slide out to the side. You should feel a stretch in the front of your thigh and knee (quadriceps). 4. Hold this position for __________ seconds. Repeat __________ times. Complete this exercise __________ times a day.   Hamstring, supine 1. Lie on your back (supine position). 2. Loop a belt or towel over the ball of your left / right foot. The ball of your foot is on the walking surface, right under your toes. 3. Straighten your left / right knee and slowly pull on the belt  to raise your leg until you feel a gentle stretch behind your knee (hamstring). ? Do not let your knee bend while you do this. ? Keep your other leg flat on the floor. 4. Hold this position for __________ seconds. Repeat __________ times. Complete this exercise __________ times a day. Strengthening exercises These exercises build strength and endurance in your knee. Endurance is the ability to use your muscles for a long time, even after they get tired. Quadriceps, isometric This exercise stretches the muscles in front of your thigh (quadriceps) without moving your knee joint (isometric). 1. Lie on your back with your left / right leg extended and your other knee bent. Put a rolled towel or small pillow under your knee if told by your health care provider. 2. Slowly tense the muscles in the front of your left / right thigh. You should see your kneecap slide up toward your hip or see increased dimpling just above the knee. This motion will push the back of the knee toward the floor. 3. For __________ seconds, hold the muscle as tight as you can without increasing your pain. 4. Relax the muscles slowly and completely. Repeat __________ times. Complete this exercise __________ times a day.   Straight leg raises This exercise stretches the muscles in front of your thigh (quadriceps) and the muscles that move your hips (hip flexors). 1. Lie on your back with your left / right leg extended and your other knee bent. 2. Tense the muscles in the front of your left / right thigh. You should see your kneecap slide up or see increased dimpling just above the knee. Your thigh may even shake a bit. 3. Keep these muscles tight as you raise your leg 4-6 inches (10-15 cm) off the floor. Do not let your knee bend. 4. Hold this position for __________ seconds. 5. Keep these muscles tense as you lower your leg. 6. Relax your muscles slowly and completely after each repetition. Repeat __________ times. Complete  this exercise __________ times a day. Hamstring, isometric 1. Lie on your back on a firm surface. 2. Bend your left / right knee about __________ degrees. 3. Dig your left / right heel into the surface as if you are trying to pull it toward your buttocks. Tighten the muscles in the back of your thighs (hamstring) to "dig" as hard as you can without increasing any pain. 4. Hold this position for __________ seconds. 5. Release the tension gradually and allow your muscles to relax completely for __________ seconds after each repetition. Repeat __________ times. Complete this exercise __________ times a day. Hamstring curls If told by your health care provider, do this exercise while wearing ankle weights. Begin with __________ lb weights. Then increase the weight by 1 lb (0.5 kg) increments. Do not wear ankle weights that are more than __________ lb. 1. Lie on your abdomen with your legs straight. 2. Bend your left / right knee as far as you can without   feeling pain. Keep your hips flat against the floor. 3. Hold this position for __________ seconds. 4. Slowly lower your leg to the starting position. Repeat __________ times. Complete this exercise __________ times a day.   Squats This exercise strengthens the muscles in front of your thigh and knee (quadriceps). 1. Stand in front of a table, with your feet and knees pointing straight ahead. You may rest your hands on the table for balance but not for support. 2. Slowly bend your knees and lower your hips like you are going to sit in a chair. ? Keep your weight over your heels, not over your toes. ? Keep your lower legs upright so they are parallel with the table legs. ? Do not let your hips go lower than your knees. ? Do not bend lower than told by your health care provider. ? If your knee pain increases, do not bend as low. 3. Hold the squat position for __________ seconds. 4. Slowly push with your legs to return to standing. Do not use your  hands to pull yourself to standing. Repeat __________ times. Complete this exercise __________ times a day. Wall slides This exercise strengthens the muscles in front of your thigh and knee (quadriceps). 1. Lean your back against a smooth wall or door, and walk your feet out 18-24 inches (46-61 cm) from it. 2. Place your feet hip-width apart. 3. Slowly slide down the wall or door until your knees bend __________ degrees. Keep your knees over your heels, not over your toes. Keep your knees in line with your hips. 4. Hold this position for __________ seconds. Repeat __________ times. Complete this exercise __________ times a day.   Straight leg raises This exercise strengthens the muscles that rotate the leg at the hip and move it away from your body (hip abductors). 1. Lie on your side with your left / right leg in the top position. Lie so your head, shoulder, knee, and hip line up. You may bend your bottom knee to help you keep your balance. 2. Roll your hips slightly forward so your hips are stacked directly over each other and your left / right knee is facing forward. 3. Leading with your heel, lift your top leg 4-6 inches (10-15 cm). You should feel the muscles in your outer hip lifting. ? Do not let your foot drift forward. ? Do not let your knee roll toward the ceiling. 4. Hold this position for __________ seconds. 5. Slowly return your leg to the starting position. 6. Let your muscles relax completely after each repetition. Repeat __________ times. Complete this exercise __________ times a day.   Straight leg raises This exercise stretches the muscles that move your hips away from the front of the pelvis (hip extensors). 1. Lie on your abdomen on a firm surface. You can put a pillow under your hips if that is more comfortable. 2. Tense the muscles in your buttocks and lift your left / right leg about 4-6 inches (10-15 cm). Keep your knee straight as you lift your leg. 3. Hold this  position for __________ seconds. 4. Slowly lower your leg to the starting position. 5. Let your leg relax completely after each repetition. Repeat __________ times. Complete this exercise __________ times a day. This information is not intended to replace advice given to you by your health care provider. Make sure you discuss any questions you have with your health care provider. Document Revised: 01/06/2018 Document Reviewed: 01/06/2018 Elsevier Patient Education  2021 Elsevier

## 2020-04-18 NOTE — Telephone Encounter (Signed)
Pt would like to speak with Dr deans assistant, regarding her left knee

## 2020-04-19 ENCOUNTER — Telehealth: Payer: Self-pay

## 2020-04-19 ENCOUNTER — Telehealth: Payer: Self-pay | Admitting: Internal Medicine

## 2020-04-19 NOTE — Telephone Encounter (Signed)
I talked with patient at length. She said that she is having a lot of knee pain and she cannot enjoy any day because of the pain. She said you told her not to come back until March but she wants to know your thoughts and if you feel she should be seen sooner than this

## 2020-04-19 NOTE — Telephone Encounter (Signed)
See below

## 2020-04-19 NOTE — Telephone Encounter (Signed)
Call to pt reference referral to PREP program from PCP, have not received a call yet S/w Husband.  Confirmed PCP name and number. Will call office for referral  Call placed to [phone] S/w [place] Informed of patient's wish to participate in exercise program with husband. Just need okay from PCP Will send over a flyer for review  [phone]

## 2020-04-19 NOTE — Telephone Encounter (Signed)
   Pam from [facility] calling for permission to enroll patient in 12 week program at [facility] for exercise and healthy lifestyle management  Phone [phone]

## 2020-04-20 ENCOUNTER — Telehealth: Payer: Self-pay

## 2020-04-20 DIAGNOSIS — Z6839 Body mass index (BMI) 39.0-39.9, adult: Secondary | ICD-10-CM | POA: Diagnosis not present

## 2020-04-20 DIAGNOSIS — Z96659 Presence of unspecified artificial knee joint: Secondary | ICD-10-CM | POA: Insufficient documentation

## 2020-04-20 DIAGNOSIS — Z01419 Encounter for gynecological examination (general) (routine) without abnormal findings: Secondary | ICD-10-CM | POA: Diagnosis not present

## 2020-04-20 DIAGNOSIS — Z1231 Encounter for screening mammogram for malignant neoplasm of breast: Secondary | ICD-10-CM | POA: Diagnosis not present

## 2020-04-20 NOTE — Telephone Encounter (Signed)
Spoke with Pam from [facility] and she stated that she is watching the covid numbers as well. She stated the plan is start the class mid February if the surge has gone down if not she plans to push the class out to March. No other questions or concerns at this time.

## 2020-04-20 NOTE — Telephone Encounter (Signed)
She can come in but essentially the only thing we could do operatively would be to excise that fragment and that may or may not help her pain.  If that something that she wants to consider then we can do that.  I do not think that the components are loose and it does not look infected at least at the last clinic visit.  She is welcome to come in next week sometime so that we can look at it.  But our decision is going to be for or against fragment excision.

## 2020-04-20 NOTE — Telephone Encounter (Signed)
Call from Dr Nathanial Millman office MD is okay with referral to PREP as long as delayed a month due to Covid.  Earliest class will start is mid Feb but dependent on COVID numbers. Will extend into March if need be Keeping all participants 6' apart, equipment is cleaned before and after use, class is at times when gym is the slowest.  All participants are masked no matter if mandated or not.  Will f/u with pt when starting class.

## 2020-04-20 NOTE — Telephone Encounter (Signed)
S/w patient. Appt scheduled.  

## 2020-04-20 NOTE — Telephone Encounter (Signed)
Talked with patient and updated her on okay for PREP from Md Pt is on meds that affect immunity Understanding we are watching COVID numbers and that will effect the start date of class Earliest will be MID feb but will push it back into March if numbers not improving She and husband will be in same class at [facility] [phone] T/TH Will update her when we have confirmed dates to start and to do initial intake assessment for her

## 2020-04-20 NOTE — Telephone Encounter (Signed)
I would recommend to wait a month or so until the surge levels decrease for her safety.

## 2020-05-01 ENCOUNTER — Ambulatory Visit (INDEPENDENT_AMBULATORY_CARE_PROVIDER_SITE_OTHER): Payer: Medicare Other | Admitting: Orthopedic Surgery

## 2020-05-01 DIAGNOSIS — M25562 Pain in left knee: Secondary | ICD-10-CM

## 2020-05-02 HISTORY — PX: KNEE ARTHROPLASTY: SHX992

## 2020-05-04 ENCOUNTER — Ambulatory Visit (INDEPENDENT_AMBULATORY_CARE_PROVIDER_SITE_OTHER): Payer: Medicare Other | Admitting: Internal Medicine

## 2020-05-04 ENCOUNTER — Encounter: Payer: Self-pay | Admitting: Orthopedic Surgery

## 2020-05-04 ENCOUNTER — Other Ambulatory Visit: Payer: Self-pay

## 2020-05-04 ENCOUNTER — Ambulatory Visit: Payer: Medicare Other | Admitting: Internal Medicine

## 2020-05-04 ENCOUNTER — Encounter: Payer: Self-pay | Admitting: Internal Medicine

## 2020-05-04 DIAGNOSIS — I10 Essential (primary) hypertension: Secondary | ICD-10-CM | POA: Diagnosis not present

## 2020-05-04 MED ORDER — AMLODIPINE BESYLATE 10 MG PO TABS: 10.0000 mg | ORAL_TABLET | Freq: Every day | ORAL | 3 refills | Status: DC

## 2020-05-04 NOTE — Progress Notes (Signed)
Office Visit Note   Patient: Marissa Roberts           Date of Birth: 31-Mar-1946           MRN: [id number] Visit Date: 05/01/2020 Requested by: [name], MD [address] [place],  [place] [address] PCP: [name], MD  Subjective: Chief Complaint  Patient presents with  . Other    Continued knee pain    HPI: Marissa Roberts is a 75 year old patient with left knee pain.  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  Several months after surgery she had an inferior pole patella fracture with small fragment which is displaced about a centimeter.  She has had significant pain since that time.  She has failed conservative treatment options including therapy mobilization activity modification and medication.  Localizes pain to the anterior aspect of the knee.  She does have a functional extensor mechanism.  Knee otherwise is functional and intact.              ROS: All systems reviewed are negative as they relate to the chief complaint within the history of present illness.  Patient denies  fevers or chills.   Assessment & Plan: Visit Diagnoses:  1. Left knee pain, unspecified chronicity     Plan: Impression is anterior left knee pain with inferior pole patella fracture but intact extensor mechanism.  She is tender over this fragment.  I think it is possible that this fragment could be bothering her.  I did tell her that it is a risk to do any type of surgery around that total knee replacement but her symptoms are currently unmanageable.  She is able to walk around and get up and down stairs but has pain in that knee region.  The rest of the knee looks good with no effusion no warmth excellent range of motion and very good patellar tracking and no radiographic complications other than that small inferior pole patella fracture.  Plan at this time is fragment excision with repair of that tendon.  Risk and benefits are discussed including not limited to infection nerve  vessel damage potential weakness as well as incomplete pain relief.  Patient understands risk and benefits and wishes to proceed.  All questions answered  Follow-Up Instructions: No follow-ups on file.   Orders:  No orders of the defined types were placed in this encounter.  No orders of the defined types were placed in this encounter.     Procedures: No procedures performed   Clinical Data: No additional findings.  Objective: Vital Signs: There were no vitals taken for this visit.  Physical Exam:   Constitutional: Patient appears well-developed HEENT:  Head: Normocephalic Eyes:EOM are normal Neck: Normal range of motion Cardiovascular: Normal rate Pulmonary/chest: Effort normal Neurologic: Patient is alert Skin: Skin is warm Psychiatric: Patient has normal mood and affect    Ortho Exam: Ortho exam demonstrates intact extensor mechanism on the left-hand side.  No effusion in the left knee.  No warmth.  Incision is intact.  Collaterals are stable to varus valgus stress at 0 30 and 90 degrees.  Pedal pulses palpable.  Does have tenderness at the inferior pole of the patella.  No real joint line tenderness.  No groin pain with internal ex rotation of the leg.  Specialty Comments:  No specialty comments available.  Imaging: No results found.   PMFS History: Patient Active Problem List   Diagnosis Date Noted  . Pain in knee region after  total knee replacement ([place]) 03/22/2020  . Rash 10/11/2019  . Arthritis of left knee 07/01/2019  . Hypercholesterolemia 04/20/2019  . B12 deficiency 03/23/2019  . Anemia 02/05/2019  . Rheumatoid arthritis with rheumatoid factor of multiple sites without organ or systems involvement ([place]) 01/25/2019  . High risk medication use 01/25/2019  . Fatigue 09/18/2018  . Routine general medical examination at a health care facility 02/07/2015  . Morbid obesity ([place]) 04/19/2013  . Hyperlipidemia associated with type 2 diabetes mellitus ([place])  07/28/2009  . Osteopenia 03/14/2009  . Diabetes mellitus type 2 with complications ([place]) 47/42/5956  . Hypothyroidism 09/24/2006  . Essential hypertension 09/24/2006   Past Medical History:  Diagnosis Date  . Arthritis   . Bronchitis    hx of  . Diabetes mellitus   . Family history of adverse reaction to anesthesia    per patient, "daughter threw up after a knee surgery"  . GERD (gastroesophageal reflux disease)   . Hypercholesteremia   . Hypertension   . Hypothyroidism   . Pneumonia    hx of  . PONV (postoperative nausea and vomiting)   . Thyroid disease     Family History  Problem Relation Age of Onset  . Diabetes Mother   . Heart disease Mother   . Hyperlipidemia Mother   . Diabetes Father   . Diabetes Sister   . Hypertension Sister   . Heart disease Sister   . Dementia Sister   . Heart disease Brother   . Healthy Daughter   . Healthy Daughter   . Healthy Daughter   . Healthy Son     Past Surgical History:  Procedure Laterality Date  . ABDOMINAL HYSTERECTOMY     partial  . BREAST SURGERY     lumpectomy left breast  . carpel tunnel    . COLONOSCOPY W/ POLYPECTOMY    . FOOT SURGERY    . knee arhtroscopy    . KNEE ARTHROPLASTY    . SHOULDER ARTHROSCOPY WITH SUBACROMIAL DECOMPRESSION, ROTATOR CUFF REPAIR AND BICEP TENDON REPAIR Right 05/26/2012   Procedure: Right Shoulder Diagnostic Operative Arthroscopy, Subacromial Decompression, Biceps Tenodesis, Mini Open Rotator Cuff Repair;  Surgeon: [name], MD;  Location: [facility];  Service: Orthopedics;  Laterality: Right;  Right Shoulder Diagnostic Operative Arthroscopy, Subacromial Decompression, Biceps Tenodesis, Mini Open Rotator Cuff Repair  . TONSILLECTOMY    . TOTAL KNEE ARTHROPLASTY Left 07/01/2019   Procedure: LEFT TOTAL KNEE ARTHROPLASTY;  Surgeon: [name], MD;  Location: [facility];  Service: Orthopedics;  Laterality: Left;   Social History   Occupational History  . Not on file  Tobacco Use   . Smoking status: Former Smoker    Quit date: 04/01/1990    Years since quitting: 30.1  . Smokeless tobacco: Never Used  Vaping Use  . Vaping Use: Never used  Substance and Sexual Activity  . Alcohol use: Not Currently    Comment: 06/22/2019: per patient about 20-25 years ago  . Drug use: No  . Sexual activity: Never    Birth control/protection: Abstinence

## 2020-05-04 NOTE — Progress Notes (Signed)
   Subjective:   Patient ID: Marissa Roberts, female    DOB: 1945-08-17, 75 y.o.   MRN: [id number]  HPI The patient is a 75 YO female coming in for elevated home BP readings over the last month. Mostly ranging in the 150-160s/80s at home. She would like them to be better controlled. Denies headaches or chest pains. Is taking medications as prescribed without being out. Denies change in diet or exercise. Has been less mobile with the left knee pain and is having surgery in the near future which hopefully will help. Feels recent clonidine rx by endo is not helping and is making her constipated with some dizziness maybe.  Review of Systems  Constitutional: Negative.   HENT: Negative.   Eyes: Negative.   Respiratory: Negative for cough, chest tightness and shortness of breath.   Cardiovascular: Negative for chest pain, palpitations and leg swelling.  Gastrointestinal: Negative for abdominal distention, abdominal pain, constipation, diarrhea, nausea and vomiting.  Musculoskeletal: Positive for arthralgias.  Skin: Negative.   Neurological: Negative.   Psychiatric/Behavioral: Negative.     Objective:  Physical Exam Constitutional:      Appearance: She is well-developed and well-nourished.  HENT:     Head: Normocephalic and atraumatic.  Eyes:     Extraocular Movements: EOM normal.  Cardiovascular:     Rate and Rhythm: Normal rate and regular rhythm.  Pulmonary:     Effort: Pulmonary effort is normal. No respiratory distress.     Breath sounds: Normal breath sounds. No wheezing or rales.  Abdominal:     General: Bowel sounds are normal. There is no distension.     Palpations: Abdomen is soft.     Tenderness: There is no abdominal tenderness. There is no rebound.  Musculoskeletal:        General: Tenderness present. No edema.     Cervical back: Normal range of motion.  Skin:    General: Skin is warm and dry.  Neurological:     Mental Status: She is alert and oriented to person,  place, and time.     Coordination: Coordination normal.  Psychiatric:        Mood and Affect: Mood and affect normal.     Vitals:   05/04/20 1331  BP: 132/76  Pulse: 75  Resp: 18  Temp: 98.1 F (36.7 C)  TempSrc: Oral  SpO2: 97%  Weight: 236 lb (107 kg)  Height: 5\' 5"  (1.651 m)    This visit occurred during the SARS-CoV-2 public health emergency.  Safety protocols were in place, including screening questions prior to the visit, additional usage of staff PPE, and extensive cleaning of exam room while observing appropriate contact time as indicated for disinfecting solutions.   Assessment & Plan:

## 2020-05-04 NOTE — Patient Instructions (Addendum)
We will have you stop the clonidine.   We will increase the dose of the amlodipine to 10 mg daily. Make sure to ask the pharmacist when you fill it to get the right dose.

## 2020-05-05 ENCOUNTER — Other Ambulatory Visit: Payer: Self-pay | Admitting: Endocrinology

## 2020-05-05 NOTE — Assessment & Plan Note (Signed)
Will stop clonidine 0.1 qhs at this is making her constipated and increase amlodipine to 10 mg daily. Also keep lasix and valsartan/hctz same dosing. Hopefully after upcoming knee surgery she will be more mobile and in less pain which should help with BP control.

## 2020-05-16 ENCOUNTER — Other Ambulatory Visit: Payer: Self-pay | Admitting: Internal Medicine

## 2020-05-16 ENCOUNTER — Encounter (HOSPITAL_COMMUNITY): Payer: Self-pay

## 2020-05-16 ENCOUNTER — Encounter (HOSPITAL_COMMUNITY)
Admission: RE | Admit: 2020-05-16 | Discharge: 2020-05-16 | Disposition: A | Discharge status: Home / Self Care | Payer: Medicare Other | Source: Ambulatory Visit | Attending: Orthopedic Surgery | Admitting: Orthopedic Surgery

## 2020-05-16 ENCOUNTER — Other Ambulatory Visit: Payer: Self-pay

## 2020-05-16 DIAGNOSIS — Z87891 Personal history of nicotine dependence: Secondary | ICD-10-CM | POA: Insufficient documentation

## 2020-05-16 DIAGNOSIS — Z01812 Encounter for preprocedural laboratory examination: Secondary | ICD-10-CM | POA: Insufficient documentation

## 2020-05-16 DIAGNOSIS — E118 Type 2 diabetes mellitus with unspecified complications: Secondary | ICD-10-CM | POA: Insufficient documentation

## 2020-05-16 DIAGNOSIS — Y929 Unspecified place or not applicable: Secondary | ICD-10-CM | POA: Diagnosis not present

## 2020-05-16 DIAGNOSIS — E7800 Pure hypercholesterolemia, unspecified: Secondary | ICD-10-CM | POA: Diagnosis not present

## 2020-05-16 DIAGNOSIS — S82002A Unspecified fracture of left patella, initial encounter for closed fracture: Secondary | ICD-10-CM | POA: Insufficient documentation

## 2020-05-16 DIAGNOSIS — Z79899 Other long term (current) drug therapy: Secondary | ICD-10-CM | POA: Diagnosis not present

## 2020-05-16 DIAGNOSIS — Z7984 Long term (current) use of oral hypoglycemic drugs: Secondary | ICD-10-CM | POA: Diagnosis not present

## 2020-05-16 DIAGNOSIS — Y939 Activity, unspecified: Secondary | ICD-10-CM | POA: Diagnosis not present

## 2020-05-16 DIAGNOSIS — Z7901 Long term (current) use of anticoagulants: Secondary | ICD-10-CM | POA: Diagnosis not present

## 2020-05-16 DIAGNOSIS — I10 Essential (primary) hypertension: Secondary | ICD-10-CM | POA: Diagnosis not present

## 2020-05-16 DIAGNOSIS — D649 Anemia, unspecified: Secondary | ICD-10-CM | POA: Diagnosis not present

## 2020-05-16 DIAGNOSIS — E039 Hypothyroidism, unspecified: Secondary | ICD-10-CM | POA: Insufficient documentation

## 2020-05-16 HISTORY — DX: Anemia, unspecified: D64.9

## 2020-05-16 LAB — CBC
HCT: 39.9 % (ref 36.0–46.0)
Hemoglobin: 12.6 g/dL (ref 12.0–15.0)
MCH: 28.1 pg (ref 26.0–34.0)
MCHC: 31.6 g/dL (ref 30.0–36.0)
MCV: 89.1 fL (ref 80.0–100.0)
Platelets: 235 10*3/uL (ref 150–400)
RBC: 4.48 MIL/uL (ref 3.87–5.11)
RDW: 14.3 % (ref 11.5–15.5)
WBC: 11.9 10*3/uL — ABNORMAL HIGH (ref 4.0–10.5)
nRBC: 0 % (ref 0.0–0.2)

## 2020-05-16 LAB — BASIC METABOLIC PANEL
Anion gap: 11 (ref 5–15)
BUN: 9 mg/dL (ref 8–23)
CO2: 26 mmol/L (ref 22–32)
Calcium: 9.7 mg/dL (ref 8.9–10.3)
Chloride: 102 mmol/L (ref 98–111)
Creatinine, Ser: 0.75 mg/dL (ref 0.44–1.00)
GFR, Estimated: >60 mL/min (ref >60)
Glucose, Bld: 107 mg/dL — ABNORMAL HIGH (ref 70–99)
Potassium: 3.6 mmol/L (ref 3.5–5.1)
Sodium: 139 mmol/L (ref 135–145)

## 2020-05-16 LAB — GLUCOSE, CAPILLARY: Glucose-Capillary: 115 mg/dL — ABNORMAL HIGH (ref 70–99)

## 2020-05-16 NOTE — Progress Notes (Signed)
Surgical Instructions    Your procedure is scheduled on 05/23/20.  Report to [facility] Main Entrance "A" at 01:00 P.M., then check in with the Admitting office.  Call this number if you have problems the morning of surgery:  [phone]   If you have any questions prior to your surgery date call [phone]: Open Monday-Friday 8am-4pm    Remember:  Do not eat or drink after midnight the night before your surgery     Take these medicines the morning of surgery with A SIP OF WATER  amLODipine (NORVASC) fluticasone (FLONASE)- if needed levothyroxine (SYNTHROID) linaclotide (LINZESS)- if needed methocarbamol (ROBAXIN)- if needed  As of today, STOP taking any Aspirin (unless otherwise instructed by your surgeon) Aleve, Naproxen, Ibuprofen, Motrin, Advil, Goody's, BC's, all herbal medications, fish oil, and all vitamins.  STOP taking leflunomide (ARAVA) 3 days prior to surgery. Last dose will be 05/19/20.   WHAT DO I DO ABOUT MY DIABETES MEDICATION?   . THE DAY BEFORE SURGERY, do not take dapagliflozin propanediol Wilder Glade). Take metformin (glucophage) as normal.  Take Tyler Aas as normal.    . THE MORNING OF SURGERY, do not take dapagliflozin propanediol Wilder Glade) or metFORMIN (GLUCOPHAGE). Take 18 units of Tresiba.      HOW TO MANAGE YOUR DIABETES BEFORE AND AFTER SURGERY  Why is it important to control my blood sugar before and after surgery? . Improving blood sugar levels before and after surgery helps healing and can limit problems. . A way of improving blood sugar control is eating a healthy diet by: o  Eating less sugar and carbohydrates o  Increasing activity/exercise o  Talking with your doctor about reaching your blood sugar goals . High blood sugars (greater than 180 mg/dL) can raise your risk of infections and slow your recovery, so you will need to focus on controlling your diabetes during the weeks before surgery. . Make sure that the doctor who takes care of  your diabetes knows about your planned surgery including the date and location.  How do I manage my blood sugar before surgery? . Check your blood sugar at least 4 times a day, starting 2 days before surgery, to make sure that the level is not too high or low. . Check your blood sugar the morning of your surgery when you wake up and every 2 hours until you get to the Short Stay unit. o If your blood sugar is less than 70 mg/dL, you will need to treat for low blood sugar: - Do not take insulin. - Treat a low blood sugar (less than 70 mg/dL) with  cup of clear juice (cranberry or apple), 4 glucose tablets, OR glucose gel. - Recheck blood sugar in 15 minutes after treatment (to make sure it is greater than 70 mg/dL). If your blood sugar is not greater than 70 mg/dL on recheck, call [phone] for further instructions. . Report your blood sugar to the short stay nurse when you get to Short Stay.  . If you are admitted to the hospital after surgery: o Your blood sugar will be checked by the staff and you will probably be given insulin after surgery (instead of oral diabetes medicines) to make sure you have good blood sugar levels. o The goal for blood sugar control after surgery is 80-180 mg/dL.             Do not wear jewelry, make up, or nail polish            Do not wear  lotions, powders, perfumes/colognes, or deodorant.            Do not shave 48 hours prior to surgery.  Men may shave face and neck.            Do not bring valuables to the hospital.            [facility] is not responsible for any belongings or valuables.  Do NOT Smoke (Tobacco/Vaping) or drink Alcohol 24 hours prior to your procedure If you use a CPAP at night, you may bring all equipment for your overnight stay.   Contacts, glasses, dentures or bridgework may not be worn into surgery, please bring cases for these belongings   For patients admitted to the hospital, discharge time will be determined by your treatment  team.   Patients discharged the day of surgery will not be allowed to drive home, and someone needs to stay with them for 24 hours.    Special instructions:   [place]- Preparing For Surgery  Before surgery, you can play an important role. Because skin is not sterile, your skin needs to be as free of germs as possible. You can reduce the number of germs on your skin by washing with CHG (chlorahexidine gluconate) Soap before surgery.  CHG is an antiseptic cleaner which kills germs and bonds with the skin to continue killing germs even after washing.    Oral Hygiene is also important to reduce your risk of infection.  Remember - BRUSH YOUR TEETH THE MORNING OF SURGERY WITH YOUR REGULAR TOOTHPASTE  Please do not use if you have an allergy to CHG or antibacterial soaps. If your skin becomes reddened/irritated stop using the CHG.  Do not shave (including legs and underarms) for at least 48 hours prior to first CHG shower. It is OK to shave your face.  Please follow these instructions carefully.   1. Shower the NIGHT BEFORE SURGERY and the MORNING OF SURGERY  2. If you chose to wash your hair, wash your hair first as usual with your normal shampoo.  3. After you shampoo, rinse your hair and body thoroughly to remove the shampoo.  4. Wash Face and genitals (private parts) with your normal soap.   5.  Shower the NIGHT BEFORE SURGERY and the MORNING OF SURGERY with CHG Soap.   6. Use CHG Soap as you would any other liquid soap. You can apply CHG directly to the skin and wash gently with a scrungie or a clean washcloth.   7. Apply the CHG Soap to your body ONLY FROM THE NECK DOWN.  Do not use on open wounds or open sores. Avoid contact with your eyes, ears, mouth and genitals (private parts). Wash Face and genitals (private parts)  with your normal soap.   8. Wash thoroughly, paying special attention to the area where your surgery will be performed.  9. Thoroughly rinse your body with warm  water from the neck down.  10. DO NOT shower/wash with your normal soap after using and rinsing off the CHG Soap.  11. Pat yourself dry with a CLEAN TOWEL.  12. Wear CLEAN PAJAMAS to bed the night before surgery  13. Place CLEAN SHEETS on your bed the night before your surgery  14. DO NOT SLEEP WITH PETS.   Day of Surgery: Take a shower.  Wear Clean/Comfortable clothing the morning of surgery Do not apply any deodorants/lotions.   Remember to brush your teeth WITH YOUR REGULAR TOOTHPASTE.   Please read  over the following fact sheets that you were given.

## 2020-05-16 NOTE — Progress Notes (Signed)
     Remember:  Do not eat after midnight the night before your surgery  You may drink clear liquids until 12:00pm the morning of your surgery.   Clear liquids allowed are: Water, Non-Citrus Juices (without pulp), Carbonated Beverages, Clear Tea, Black Coffee Only, and Gatorade

## 2020-05-16 NOTE — Progress Notes (Signed)
PCP - elizabeth crawford Cardiologist - denies  PPM/ICD - denies   Chest x-ray - n/a EKG - 06/22/19 Stress Test - over 10 years ago ECHO - denies Cardiac Cath - denies  Sleep Study - denies   Fasting Blood Sugar - 90-110 Checks Blood Sugar 3-4 times a day.   Patient instructed to hold all Aspirin, NSAID's, herbal medications, fish oil and vitamins 7 days prior to surgery.   ERAS Protcol -no   COVID TEST- 05/19/20 0845   Anesthesia review: yes, normal stress test 10 years ago. Can not recall cardiologists name. Was done at [facility] cardiology (records requested).   Patient denies shortness of breath, fever, cough and chest pain at PAT appointment   All instructions explained to the patient, with a verbal understanding of the material. Patient agrees to go over the instructions while at home for a better understanding. Patient also instructed to self quarantine after being tested for COVID-19. The opportunity to ask questions was provided.

## 2020-05-17 ENCOUNTER — Telehealth: Payer: Self-pay | Admitting: Orthopedic Surgery

## 2020-05-17 LAB — HEMOGLOBIN A1C
Hgb A1c MFr Bld: 5.7 % — ABNORMAL HIGH (ref 4.8–5.6)
Mean Plasma Glucose: 117 mg/dL

## 2020-05-17 NOTE — Anesthesia Preprocedure Evaluation (Addendum)
Anesthesia Evaluation  Patient identified by MRN, date of birth, ID band Patient awake    Reviewed: Allergy & Precautions, NPO status , Patient's Chart, lab work & pertinent test results  Airway Mallampati: II  TM Distance: >3 FB Neck ROM: Full    Dental  (+) Dental Advisory Given   Pulmonary former smoker,    breath sounds clear to auscultation       Cardiovascular hypertension, Pt. on medications  Rhythm:Regular Rate:Normal     Neuro/Psych negative neurological ROS     GI/Hepatic Neg liver ROS, GERD  ,  Endo/Other  diabetes, Type 2Hypothyroidism   Renal/GU negative Renal ROS     Musculoskeletal  (+) Arthritis ,   Abdominal   Peds  Hematology negative hematology ROS (+)   Anesthesia Other Findings   Reproductive/Obstetrics                            Anesthesia Physical Anesthesia Plan  ASA: III  Anesthesia Plan: General   Post-op Pain Management:  Regional for Post-op pain   Induction: Intravenous  PONV Risk Score and Plan: 3 and Dexamethasone, Ondansetron and Treatment may vary due to age or medical condition  Airway Management Planned: LMA  Additional Equipment: None  Intra-op Plan:   Post-operative Plan: Extubation in OR  Informed Consent: I have reviewed the patients History and Physical, chart, labs and discussed the procedure including the risks, benefits and alternatives for the proposed anesthesia with the patient or authorized representative who has indicated his/her understanding and acceptance.     Dental advisory given  Plan Discussed with: CRNA  Anesthesia Plan Comments: ( )       Anesthesia Quick Evaluation

## 2020-05-17 NOTE — Telephone Encounter (Signed)
Patient is scheduled for left knee inferior pole patella excision on FEB 22 at [place] with Dr. Marlou Sa.  Patient wants to know if she is to continue on her Humira or if she needs to come off.      Pt cb  [phone] or [phone]

## 2020-05-17 NOTE — Progress Notes (Signed)
Anesthesia Chart Review:  Case: [id number] Date/Time: 05/23/20 1447   Procedure: LEFT KNEE POLE PATELLA EXCISION (Left Knee)   Anesthesia type: Choice   Pre-op diagnosis: left knee inferior pole patella fracture   Location: [facility] OR ROOM 06 / [place] [facility]   Surgeons: [name], MD      DISCUSSION: Patient is a 75 year old female scheduled for the above procedure.  History includes former smoker (quit 04/01/90), post-operative N/V, HTN, hypothyroidism, GERD, hypercholesterolemia, anemia, DM2, RA, TKA (left 07/01/19). BMI is consistent with obesity.   Presurgical COVID-19 test is scheduled for 05/19/20. Anesthesia team to evaluate on the day of procedure.    VS: BP (!) 144/61   Pulse 80   Temp 36.7 C (Oral)   Resp 18   Ht 5\' 5"  (1.651 m)   Wt 106 kg   SpO2 98%   BMI 38.89 kg/m    PROVIDERS: [name], MD is PCP. Last visit 05/05/20. She is aware of surgery plans. Elayne Snare, MD is endocrinologist. Last visit 03/07/20. Bo Merino, MD is rheumatologist. Last visit 04/18/20.   LABS: Labs reviewed: Acceptable for surgery. (all labs ordered are listed, but only abnormal results are displayed)  Labs Reviewed  GLUCOSE, CAPILLARY - Abnormal; Notable for the following components:      Result Value   Glucose-Capillary 115 (*)    All other components within normal limits  BASIC METABOLIC PANEL - Abnormal; Notable for the following components:   Glucose, Bld 107 (*)    All other components within normal limits  CBC - Abnormal; Notable for the following components:   WBC 11.9 (*)    All other components within normal limits  HEMOGLOBIN A1C - Abnormal; Notable for the following components:   Hgb A1c MFr Bld 5.7 (*)    All other components within normal limits     IMAGES: MRI left knee 06/08/19: IMPRESSION: 1. Large radial tear involving the posterior horn of the medial meniscus with associated medial protrusion of the dysfunctional meniscus. 2. Small subchondral  stress fracture involving the medial femoral condyle with significant surrounding marrow edema. There is also a moderate stress reaction in the tibia. 3. Intact ligamentous structures. 4. Tricompartmental degenerative changes. 5. Moderate-sized joint effusion and small to moderate-sized Baker's cyst.   EKG: 06/22/19: Sinus rhythm with 1st degree A-V block Otherwise normal ECG No significant change since last tracing Confirmed by Ena Dawley [phone]) on 06/23/2019 7:04:14 PM   CV: Reported prior stress test > 10 years ago.  Past Medical History:  Diagnosis Date  . Anemia   . Arthritis   . Bronchitis    hx of  . Diabetes mellitus    type 2  . Family history of adverse reaction to anesthesia    per patient, "daughter threw up after a knee surgery"  . GERD (gastroesophageal reflux disease)   . Hypercholesteremia   . Hypertension   . Hypothyroidism   . Pneumonia    hx of  . PONV (postoperative nausea and vomiting)   . Thyroid disease     Past Surgical History:  Procedure Laterality Date  . ABDOMINAL HYSTERECTOMY     partial  . BREAST SURGERY     lumpectomy left breast  . carpel tunnel    . COLONOSCOPY W/ POLYPECTOMY    . FOOT SURGERY    . knee arhtroscopy    . KNEE ARTHROPLASTY    . SHOULDER ARTHROSCOPY WITH SUBACROMIAL DECOMPRESSION, ROTATOR CUFF REPAIR AND BICEP TENDON REPAIR Right 05/26/2012  Procedure: Right Shoulder Diagnostic Operative Arthroscopy, Subacromial Decompression, Biceps Tenodesis, Mini Open Rotator Cuff Repair;  Surgeon: [name], MD;  Location: [facility];  Service: Orthopedics;  Laterality: Right;  Right Shoulder Diagnostic Operative Arthroscopy, Subacromial Decompression, Biceps Tenodesis, Mini Open Rotator Cuff Repair  . TONSILLECTOMY    . TOTAL KNEE ARTHROPLASTY Left 07/01/2019   Procedure: LEFT TOTAL KNEE ARTHROPLASTY;  Surgeon: [name], MD;  Location: [facility];  Service: Orthopedics;  Laterality: Left;    MEDICATIONS: .  Adalimumab (HUMIRA PEN) 40 MG/0.4ML PNKT  . amLODipine (NORVASC) 10 MG tablet  . BD PEN NEEDLE NANO U/F 32G X 4 MM MISC  . Biotin 10000 MCG TABS  . Calcium Carb-Cholecalciferol (CALCIUM 600 + D PO)  . cephALEXin (KEFLEX) 500 MG capsule  . dapagliflozin propanediol (FARXIGA) 5 MG TABS tablet  . diclofenac Sodium (VOLTAREN) 1 % GEL  . fluticasone (FLONASE) 50 MCG/ACT nasal spray  . folic acid (FOLVITE) 1 MG tablet  . furosemide (LASIX) 40 MG tablet  . Glucosamine HCl-MSM (GLUCOSAMINE-MSM PO)  . glucose blood (ONETOUCH VERIO) test strip  . Lancets (ONETOUCH DELICA PLUS [id number]) MISC  . leflunomide (ARAVA) 20 MG tablet  . levothyroxine (SYNTHROID) 137 MCG tablet  . linaclotide (LINZESS) 290 MCG CAPS capsule  . metFORMIN (GLUCOPHAGE) 1000 MG tablet  . methocarbamol (ROBAXIN) 500 MG tablet  . montelukast (SINGULAIR) 10 MG tablet  . Multiple Vitamin (MULTIVITAMIN WITH MINERALS) TABS tablet  . NOVOLOG FLEXPEN 100 UNIT/ML FlexPen  . potassium chloride (KLOR-CON) 10 MEQ tablet  . rosuvastatin (CRESTOR) 40 MG tablet  . TRESIBA FLEXTOUCH 200 UNIT/ML FlexTouch Pen  . valsartan-hydrochlorothiazide (DIOVAN-HCT) 320-12.5 MG tablet   No current facility-administered medications for this encounter.    Myra Gianotti, PA-C Surgical Short Stay/Anesthesiology [facility] Phone [phone] [facility] Phone [phone] 05/17/2020 4:20 PM

## 2020-05-17 NOTE — Progress Notes (Signed)
Met with pt last pm at [facility] to do initial intake.  Pt informed she is scheduled for an orthopedic surgery next Tuesday ( day of PREP start) She will need to stay overnight after surgery and doesn't know yet restrictions or WB status post surgery Asked that we postpone start until we are post op and pain free with MD (ortho) release to exercise. Pt agreeable to stay in touch and plan for starting later.

## 2020-05-18 ENCOUNTER — Telehealth: Payer: Self-pay | Admitting: Orthopedic Surgery

## 2020-05-18 ENCOUNTER — Other Ambulatory Visit: Payer: Self-pay | Admitting: Physician Assistant

## 2020-05-18 DIAGNOSIS — M0579 Rheumatoid arthritis with rheumatoid factor of multiple sites without organ or systems involvement: Secondary | ICD-10-CM

## 2020-05-18 NOTE — Telephone Encounter (Signed)
Tried calling patient at both numbers. No answer on either. Will try again later.

## 2020-05-18 NOTE — Telephone Encounter (Signed)
Dc humira thx

## 2020-05-18 NOTE — Telephone Encounter (Signed)
IC back, was able to reach patient and advised per Dr Forbes Cellar note below.

## 2020-05-18 NOTE — Telephone Encounter (Signed)
Last Visit: 04/18/2020 Next Visit: 09/18/2020 Labs: 05/16/2020,  WBC 11.9 Glucose, Bld 107 Hgb A1C 5.7  TB Gold: 02/21/2020, negative  Current Dose per office note 04/18/2020, Humira 40mg  every 14 days  RD:[id number] arthritis involving multiple sites with positive rheumatoid factor   Last Fill: 03/09/2020   Okay to refill Humira?

## 2020-05-18 NOTE — Telephone Encounter (Signed)
Done see other note 

## 2020-05-18 NOTE — Telephone Encounter (Signed)
Patient called back. Would like someone to call her back.

## 2020-05-18 NOTE — Telephone Encounter (Signed)
Can either of you please advise? Thanks

## 2020-05-19 ENCOUNTER — Other Ambulatory Visit (HOSPITAL_COMMUNITY)
Admission: RE | Admit: 2020-05-19 | Discharge: 2020-05-19 | Disposition: A | Discharge status: Home / Self Care | Payer: Medicare Other | Source: Ambulatory Visit | Attending: Orthopedic Surgery | Admitting: Orthopedic Surgery

## 2020-05-19 DIAGNOSIS — Z20822 Contact with and (suspected) exposure to covid-19: Secondary | ICD-10-CM | POA: Insufficient documentation

## 2020-05-19 DIAGNOSIS — Z01812 Encounter for preprocedural laboratory examination: Secondary | ICD-10-CM | POA: Insufficient documentation

## 2020-05-20 LAB — SARS CORONAVIRUS 2 (TAT 6-24 HRS): SARS Coronavirus 2: NEGATIVE

## 2020-05-23 ENCOUNTER — Other Ambulatory Visit: Payer: Self-pay

## 2020-05-23 ENCOUNTER — Encounter (HOSPITAL_COMMUNITY): Payer: Self-pay | Admitting: Orthopedic Surgery

## 2020-05-23 ENCOUNTER — Ambulatory Visit (HOSPITAL_COMMUNITY): Payer: Medicare Other

## 2020-05-23 ENCOUNTER — Ambulatory Visit (HOSPITAL_COMMUNITY): Payer: Medicare Other | Admitting: Vascular Surgery

## 2020-05-23 ENCOUNTER — Encounter (HOSPITAL_COMMUNITY)
Admission: RE | Disposition: A | Discharge status: Home / Self Care | Payer: Self-pay | Source: Home / Self Care | Attending: Orthopedic Surgery

## 2020-05-23 ENCOUNTER — Observation Stay (HOSPITAL_COMMUNITY)
Admission: RE | Admit: 2020-05-23 | Discharge: 2020-05-24 | Disposition: A | Discharge status: Home / Self Care | Payer: Medicare Other | Attending: Orthopedic Surgery | Admitting: Orthopedic Surgery

## 2020-05-23 ENCOUNTER — Ambulatory Visit (HOSPITAL_COMMUNITY): Payer: Medicare Other | Admitting: Certified Registered"

## 2020-05-23 DIAGNOSIS — E119 Type 2 diabetes mellitus without complications: Secondary | ICD-10-CM | POA: Insufficient documentation

## 2020-05-23 DIAGNOSIS — I10 Essential (primary) hypertension: Secondary | ICD-10-CM | POA: Diagnosis not present

## 2020-05-23 DIAGNOSIS — Z79899 Other long term (current) drug therapy: Secondary | ICD-10-CM | POA: Diagnosis not present

## 2020-05-23 DIAGNOSIS — M1710 Unilateral primary osteoarthritis, unspecified knee: Secondary | ICD-10-CM | POA: Diagnosis present

## 2020-05-23 DIAGNOSIS — X58XXXA Exposure to other specified factors, initial encounter: Secondary | ICD-10-CM | POA: Diagnosis not present

## 2020-05-23 DIAGNOSIS — S82032G Displaced transverse fracture of left patella, subsequent encounter for closed fracture with delayed healing: Secondary | ICD-10-CM

## 2020-05-23 DIAGNOSIS — E039 Hypothyroidism, unspecified: Secondary | ICD-10-CM | POA: Diagnosis not present

## 2020-05-23 DIAGNOSIS — Z7984 Long term (current) use of oral hypoglycemic drugs: Secondary | ICD-10-CM | POA: Diagnosis not present

## 2020-05-23 DIAGNOSIS — S82002K Unspecified fracture of left patella, subsequent encounter for closed fracture with nonunion: Secondary | ICD-10-CM | POA: Diagnosis not present

## 2020-05-23 DIAGNOSIS — Z96652 Presence of left artificial knee joint: Secondary | ICD-10-CM | POA: Insufficient documentation

## 2020-05-23 DIAGNOSIS — Z87891 Personal history of nicotine dependence: Secondary | ICD-10-CM | POA: Insufficient documentation

## 2020-05-23 DIAGNOSIS — Z981 Arthrodesis status: Secondary | ICD-10-CM | POA: Diagnosis not present

## 2020-05-23 DIAGNOSIS — Z880 Allergy status to penicillin: Secondary | ICD-10-CM | POA: Diagnosis not present

## 2020-05-23 DIAGNOSIS — S82092A Other fracture of left patella, initial encounter for closed fracture: Secondary | ICD-10-CM | POA: Diagnosis not present

## 2020-05-23 DIAGNOSIS — Z419 Encounter for procedure for purposes other than remedying health state, unspecified: Secondary | ICD-10-CM

## 2020-05-23 DIAGNOSIS — Z91041 Radiographic dye allergy status: Secondary | ICD-10-CM | POA: Diagnosis not present

## 2020-05-23 DIAGNOSIS — G8918 Other acute postprocedural pain: Secondary | ICD-10-CM | POA: Diagnosis not present

## 2020-05-23 DIAGNOSIS — E7800 Pure hypercholesterolemia, unspecified: Secondary | ICD-10-CM | POA: Diagnosis not present

## 2020-05-23 HISTORY — PX: ORIF PATELLA: SHX5033

## 2020-05-23 LAB — GLUCOSE, CAPILLARY
Glucose-Capillary: 103 mg/dL — ABNORMAL HIGH (ref 70–99)
Glucose-Capillary: 94 mg/dL (ref 70–99)
Glucose-Capillary: 88 mg/dL (ref 70–99)
Glucose-Capillary: 287 mg/dL — ABNORMAL HIGH (ref 70–99)

## 2020-05-23 SURGERY — OPEN REDUCTION INTERNAL FIXATION (ORIF) PATELLA
Anesthesia: General | Site: Knee | Laterality: Left

## 2020-05-23 MED ORDER — ACETAMINOPHEN 500 MG PO TABS: 1000.0000 mg | ORAL_TABLET | Freq: Once | ORAL | Status: AC

## 2020-05-23 MED ORDER — CHLORHEXIDINE GLUCONATE 0.12 % MT SOLN
15.0000 mL | Freq: Once | OROMUCOSAL | Status: AC
  Administered 2020-05-23: 15 mL via OROMUCOSAL

## 2020-05-23 MED ORDER — CEFAZOLIN SODIUM-DEXTROSE 2-4 GM/100ML-% IV SOLN
2.0000 g | Freq: Four times a day (QID) | INTRAVENOUS | Status: AC
  Administered 2020-05-23 – 2020-05-24 (×3): 2 g via INTRAVENOUS
  Filled 2020-05-23 (×2): qty 100

## 2020-05-23 MED ORDER — VALSARTAN-HYDROCHLOROTHIAZIDE 320-12.5 MG PO TABS: 1.0000 | ORAL_TABLET | Freq: Every day | ORAL | Status: DC

## 2020-05-23 MED ORDER — FENTANYL CITRATE (PF) 250 MCG/5ML IJ SOLN
INTRAMUSCULAR | Status: AC
  Filled 2020-05-23: qty 5

## 2020-05-23 MED ORDER — HYDROCHLOROTHIAZIDE 12.5 MG PO CAPS
12.5000 mg | ORAL_CAPSULE | Freq: Every day | ORAL | Status: DC
  Administered 2020-05-24: 12.5 mg via ORAL
  Filled 2020-05-23: qty 1

## 2020-05-23 MED ORDER — LEVOTHYROXINE SODIUM 137 MCG PO TABS
137.0000 ug | ORAL_TABLET | Freq: Every day | ORAL | Status: DC
  Administered 2020-05-24: 137 ug via ORAL
  Filled 2020-05-23: qty 1

## 2020-05-23 MED ORDER — POVIDONE-IODINE 10 % EX SWAB: 2.0000 "application " | Freq: Once | CUTANEOUS | Status: DC

## 2020-05-23 MED ORDER — MIDAZOLAM HCL 2 MG/2ML IJ SOLN
INTRAMUSCULAR | Status: AC
  Filled 2020-05-23: qty 2

## 2020-05-23 MED ORDER — POTASSIUM CHLORIDE ER 10 MEQ PO TBCR
10.0000 meq | EXTENDED_RELEASE_TABLET | Freq: Every day | ORAL | Status: DC
  Administered 2020-05-24: 10 meq via ORAL
  Filled 2020-05-23: qty 1

## 2020-05-23 MED ORDER — ROSUVASTATIN CALCIUM 20 MG PO TABS: 40.0000 mg | ORAL_TABLET | Freq: Every day | ORAL | Status: DC

## 2020-05-23 MED ORDER — DAPAGLIFLOZIN PROPANEDIOL 5 MG PO TABS
5.0000 mg | ORAL_TABLET | Freq: Every day | ORAL | Status: DC
  Filled 2020-05-23: qty 1

## 2020-05-23 MED ORDER — LINACLOTIDE 145 MCG PO CAPS
290.0000 ug | ORAL_CAPSULE | Freq: Every day | ORAL | Status: DC | PRN
  Filled 2020-05-23: qty 2

## 2020-05-23 MED ORDER — MIDAZOLAM HCL 5 MG/5ML IJ SOLN
INTRAMUSCULAR | Status: DC | PRN
  Administered 2020-05-23: 1 mg via INTRAVENOUS

## 2020-05-23 MED ORDER — TRAMADOL HCL 50 MG PO TABS
50.0000 mg | ORAL_TABLET | Freq: Four times a day (QID) | ORAL | Status: DC
  Administered 2020-05-23 – 2020-05-24 (×3): 50 mg via ORAL
  Filled 2020-05-23 (×3): qty 1

## 2020-05-23 MED ORDER — METFORMIN HCL 500 MG PO TABS
1000.0000 mg | ORAL_TABLET | Freq: Two times a day (BID) | ORAL | Status: DC
  Administered 2020-05-24: 1000 mg via ORAL
  Filled 2020-05-23: qty 2

## 2020-05-23 MED ORDER — DEXAMETHASONE SODIUM PHOSPHATE 10 MG/ML IJ SOLN
INTRAMUSCULAR | Status: DC | PRN
  Administered 2020-05-23: 5 mg via INTRAVENOUS

## 2020-05-23 MED ORDER — ONDANSETRON HCL 4 MG/2ML IJ SOLN
INTRAMUSCULAR | Status: DC | PRN
  Administered 2020-05-23: 4 mg via INTRAVENOUS

## 2020-05-23 MED ORDER — HYDROCODONE-ACETAMINOPHEN 5-325 MG PO TABS
1.0000 | ORAL_TABLET | ORAL | Status: DC | PRN
  Administered 2020-05-24: 1 via ORAL
  Filled 2020-05-23: qty 1

## 2020-05-23 MED ORDER — METHOCARBAMOL 500 MG PO TABS
ORAL_TABLET | ORAL | Status: AC
  Administered 2020-05-23: 500 mg
  Filled 2020-05-23: qty 1

## 2020-05-23 MED ORDER — FENTANYL CITRATE (PF) 100 MCG/2ML IJ SOLN
25.0000 ug | INTRAMUSCULAR | Status: DC | PRN
  Administered 2020-05-23: 50 ug via INTRAVENOUS

## 2020-05-23 MED ORDER — SODIUM CHLORIDE 0.9 % IV SOLN: INTRAVENOUS | Status: AC

## 2020-05-23 MED ORDER — INSULIN ASPART 100 UNIT/ML ~~LOC~~ SOLN
0.0000 [IU] | Freq: Every day | SUBCUTANEOUS | Status: DC
  Administered 2020-05-23: 3 [IU] via SUBCUTANEOUS

## 2020-05-23 MED ORDER — DOCUSATE SODIUM 100 MG PO CAPS
100.0000 mg | ORAL_CAPSULE | Freq: Two times a day (BID) | ORAL | Status: DC
  Administered 2020-05-23 – 2020-05-24 (×2): 100 mg via ORAL
  Filled 2020-05-23 (×2): qty 1

## 2020-05-23 MED ORDER — FOLIC ACID 1 MG PO TABS
2.0000 mg | ORAL_TABLET | Freq: Every day | ORAL | Status: DC
  Administered 2020-05-24: 2 mg via ORAL
  Filled 2020-05-23: qty 2

## 2020-05-23 MED ORDER — FENTANYL CITRATE (PF) 100 MCG/2ML IJ SOLN
50.0000 ug | Freq: Once | INTRAMUSCULAR | Status: AC
Start: 2020-05-23 — End: 2020-05-23

## 2020-05-23 MED ORDER — ACETAMINOPHEN 500 MG PO TABS
ORAL_TABLET | ORAL | Status: AC
  Administered 2020-05-23: 1000 mg via ORAL
  Filled 2020-05-23: qty 2

## 2020-05-23 MED ORDER — ACETAMINOPHEN 325 MG PO TABS
325.0000 mg | ORAL_TABLET | Freq: Four times a day (QID) | ORAL | Status: DC | PRN
Start: 2020-05-24 — End: 2020-05-24

## 2020-05-23 MED ORDER — FENTANYL CITRATE (PF) 250 MCG/5ML IJ SOLN
INTRAMUSCULAR | Status: DC | PRN
  Administered 2020-05-23 (×5): 25 ug via INTRAVENOUS

## 2020-05-23 MED ORDER — 0.9 % SODIUM CHLORIDE (POUR BTL) OPTIME
TOPICAL | Status: DC | PRN
  Administered 2020-05-23: 1000 mL

## 2020-05-23 MED ORDER — ASPIRIN 81 MG PO CHEW
81.0000 mg | CHEWABLE_TABLET | Freq: Every day | ORAL | Status: DC
  Administered 2020-05-24: 81 mg via ORAL
  Filled 2020-05-23: qty 1

## 2020-05-23 MED ORDER — LACTATED RINGERS IV SOLN: INTRAVENOUS | Status: DC

## 2020-05-23 MED ORDER — ADULT MULTIVITAMIN W/MINERALS CH
1.0000 | ORAL_TABLET | Freq: Every day | ORAL | Status: DC
  Administered 2020-05-24: 1 via ORAL
  Filled 2020-05-23: qty 1

## 2020-05-23 MED ORDER — VANCOMYCIN HCL 1000 MG IV SOLR
INTRAVENOUS | Status: AC
  Filled 2020-05-23: qty 1000

## 2020-05-23 MED ORDER — PROPOFOL 10 MG/ML IV BOLUS
INTRAVENOUS | Status: DC | PRN
  Administered 2020-05-23: 150 mg via INTRAVENOUS
  Administered 2020-05-23: 20 mg via INTRAVENOUS

## 2020-05-23 MED ORDER — LEFLUNOMIDE 20 MG PO TABS
20.0000 mg | ORAL_TABLET | Freq: Every day | ORAL | Status: DC
  Administered 2020-05-24: 20 mg via ORAL
  Filled 2020-05-23: qty 1

## 2020-05-23 MED ORDER — FUROSEMIDE 40 MG PO TABS
40.0000 mg | ORAL_TABLET | Freq: Every day | ORAL | Status: DC
  Administered 2020-05-24: 40 mg via ORAL
  Filled 2020-05-23: qty 1

## 2020-05-23 MED ORDER — METOCLOPRAMIDE HCL 5 MG/ML IJ SOLN: 5.0000 mg | Freq: Three times a day (TID) | INTRAMUSCULAR | Status: DC | PRN

## 2020-05-23 MED ORDER — AMISULPRIDE (ANTIEMETIC) 5 MG/2ML IV SOLN: 10.0000 mg | Freq: Once | INTRAVENOUS | Status: DC | PRN

## 2020-05-23 MED ORDER — ONDANSETRON HCL 4 MG/2ML IJ SOLN: 4.0000 mg | Freq: Four times a day (QID) | INTRAMUSCULAR | Status: DC | PRN

## 2020-05-23 MED ORDER — METHOCARBAMOL 500 MG PO TABS
500.0000 mg | ORAL_TABLET | Freq: Three times a day (TID) | ORAL | Status: DC | PRN
  Administered 2020-05-24: 500 mg via ORAL
  Filled 2020-05-23: qty 1

## 2020-05-23 MED ORDER — CEFAZOLIN SODIUM-DEXTROSE 2-4 GM/100ML-% IV SOLN
2.0000 g | INTRAVENOUS | Status: AC
  Administered 2020-05-23: 2 g via INTRAVENOUS
  Filled 2020-05-23: qty 100

## 2020-05-23 MED ORDER — MONTELUKAST SODIUM 10 MG PO TABS
10.0000 mg | ORAL_TABLET | Freq: Every day | ORAL | Status: DC
  Administered 2020-05-23: 10 mg via ORAL
  Filled 2020-05-23: qty 1

## 2020-05-23 MED ORDER — METOCLOPRAMIDE HCL 5 MG PO TABS: 5.0000 mg | ORAL_TABLET | Freq: Three times a day (TID) | ORAL | Status: DC | PRN

## 2020-05-23 MED ORDER — AMLODIPINE BESYLATE 5 MG PO TABS
10.0000 mg | ORAL_TABLET | Freq: Every day | ORAL | Status: DC
  Administered 2020-05-24: 10 mg via ORAL
  Filled 2020-05-23: qty 2

## 2020-05-23 MED ORDER — ROPIVACAINE HCL 5 MG/ML IJ SOLN
INTRAMUSCULAR | Status: DC | PRN
  Administered 2020-05-23: 20 mL via PERINEURAL

## 2020-05-23 MED ORDER — LIDOCAINE HCL (CARDIAC) PF 100 MG/5ML IV SOSY
PREFILLED_SYRINGE | INTRAVENOUS | Status: DC | PRN
  Administered 2020-05-23: 40 mg via INTRAVENOUS

## 2020-05-23 MED ORDER — FENTANYL CITRATE (PF) 100 MCG/2ML IJ SOLN
INTRAMUSCULAR | Status: AC
  Administered 2020-05-23: 50 ug via INTRAVENOUS
  Filled 2020-05-23: qty 2

## 2020-05-23 MED ORDER — ONDANSETRON HCL 4 MG PO TABS: 4.0000 mg | ORAL_TABLET | Freq: Four times a day (QID) | ORAL | Status: DC | PRN

## 2020-05-23 MED ORDER — MORPHINE SULFATE (PF) 2 MG/ML IV SOLN: 0.5000 mg | INTRAVENOUS | Status: DC | PRN

## 2020-05-23 MED ORDER — CALCIUM CARBONATE-VITAMIN D 500-200 MG-UNIT PO TABS
1.0000 | ORAL_TABLET | Freq: Every day | ORAL | Status: DC
  Administered 2020-05-24: 1 via ORAL
  Filled 2020-05-23: qty 1

## 2020-05-23 MED ORDER — INSULIN ASPART 100 UNIT/ML ~~LOC~~ SOLN
4.0000 [IU] | Freq: Three times a day (TID) | SUBCUTANEOUS | Status: DC
  Administered 2020-05-24: 4 [IU] via SUBCUTANEOUS

## 2020-05-23 MED ORDER — VANCOMYCIN HCL 1000 MG IV SOLR
INTRAVENOUS | Status: DC | PRN
  Administered 2020-05-23: 1000 mg via TOPICAL

## 2020-05-23 MED ORDER — POVIDONE-IODINE 7.5 % EX SOLN
Freq: Once | CUTANEOUS | Status: DC
  Filled 2020-05-23: qty 118

## 2020-05-23 MED ORDER — IRBESARTAN 150 MG PO TABS
300.0000 mg | ORAL_TABLET | Freq: Every day | ORAL | Status: DC
  Administered 2020-05-24: 300 mg via ORAL
  Filled 2020-05-23: qty 2

## 2020-05-23 MED ORDER — ORAL CARE MOUTH RINSE: 15.0000 mL | Freq: Once | OROMUCOSAL | Status: AC

## 2020-05-23 MED ORDER — CALCIUM CARBONATE-VITAMIN D3 600-400 MG-UNIT PO TABS: ORAL_TABLET | Freq: Every day | ORAL | Status: DC

## 2020-05-23 MED ORDER — INSULIN ASPART 100 UNIT/ML ~~LOC~~ SOLN
0.0000 [IU] | Freq: Three times a day (TID) | SUBCUTANEOUS | Status: DC
  Administered 2020-05-24: 5 [IU] via SUBCUTANEOUS

## 2020-05-23 MED ORDER — FLUTICASONE PROPIONATE 50 MCG/ACT NA SUSP: 2.0000 | Freq: Two times a day (BID) | NASAL | Status: DC | PRN

## 2020-05-23 SURGICAL SUPPLY — 60 items
BANDAGE ESMARK 6X9 LF (GAUZE/BANDAGES/DRESSINGS) ×1 IMPLANT
BLADE CLIPPER SURG (BLADE) ×2 IMPLANT
BNDG CMPR 9X6 STRL LF SNTH (GAUZE/BANDAGES/DRESSINGS) ×1
BNDG CMPR MED 10X6 ELC LF (GAUZE/BANDAGES/DRESSINGS) ×1
BNDG COHESIVE 4X5 TAN STRL (GAUZE/BANDAGES/DRESSINGS) ×2 IMPLANT
BNDG ELASTIC 4X5.8 VLCR STR LF (GAUZE/BANDAGES/DRESSINGS) ×2 IMPLANT
BNDG ELASTIC 6X10 VLCR STRL LF (GAUZE/BANDAGES/DRESSINGS) ×1 IMPLANT
BNDG ELASTIC 6X5.8 VLCR STR LF (GAUZE/BANDAGES/DRESSINGS) ×2 IMPLANT
BNDG ESMARK 6X9 LF (GAUZE/BANDAGES/DRESSINGS) ×2
CLSR STERI-STRIP ANTIMIC 1/2X4 (GAUZE/BANDAGES/DRESSINGS) ×1 IMPLANT
COVER SURGICAL LIGHT HANDLE (MISCELLANEOUS) ×2 IMPLANT
COVER WAND RF STERILE (DRAPES) ×2 IMPLANT
CUFF TOURN SGL QUICK 34 (TOURNIQUET CUFF)
CUFF TOURN SGL QUICK 42 (TOURNIQUET CUFF) IMPLANT
CUFF TRNQT CYL 34X4.125X (TOURNIQUET CUFF) IMPLANT
DECANTER SPIKE VIAL GLASS SM (MISCELLANEOUS) IMPLANT
DRAPE C-ARM 42X72 X-RAY (DRAPES) ×2 IMPLANT
DRSG ADAPTIC 3X8 NADH LF (GAUZE/BANDAGES/DRESSINGS) ×2 IMPLANT
DRSG EMULSION OIL 3X3 NADH (GAUZE/BANDAGES/DRESSINGS) ×2 IMPLANT
DRSG PAD ABDOMINAL 8X10 ST (GAUZE/BANDAGES/DRESSINGS) ×2 IMPLANT
DRSG TEGADERM 4X4.5 CHG (GAUZE/BANDAGES/DRESSINGS) ×1 IMPLANT
ELECT REM PT RETURN 9FT ADLT (ELECTROSURGICAL) ×2
ELECTRODE REM PT RTRN 9FT ADLT (ELECTROSURGICAL) ×1 IMPLANT
GAUZE SPONGE 4X4 16PLY XRAY LF (GAUZE/BANDAGES/DRESSINGS) ×1 IMPLANT
GLOVE BIO SURGEON STRL SZ8 (GLOVE) ×4 IMPLANT
GLOVE SRG 8 PF TXTR STRL LF DI (GLOVE) ×1 IMPLANT
GLOVE SURG UNDER POLY LF SZ8 (GLOVE) ×2
GOWN STRL REUS W/ TWL LRG LVL3 (GOWN DISPOSABLE) IMPLANT
GOWN STRL REUS W/ TWL XL LVL3 (GOWN DISPOSABLE) ×2 IMPLANT
GOWN STRL REUS W/TWL LRG LVL3 (GOWN DISPOSABLE)
GOWN STRL REUS W/TWL XL LVL3 (GOWN DISPOSABLE) ×4
KIT BASIN OR (CUSTOM PROCEDURE TRAY) ×2 IMPLANT
KIT TURNOVER KIT B (KITS) ×2 IMPLANT
MANIFOLD NEPTUNE II (INSTRUMENTS) ×2 IMPLANT
NEEDLE 22X1 1/2 (OR ONLY) (NEEDLE) IMPLANT
NS IRRIG 1000ML POUR BTL (IV SOLUTION) ×2 IMPLANT
PACK ORTHO EXTREMITY (CUSTOM PROCEDURE TRAY) ×2 IMPLANT
PAD ARMBOARD 7.5X6 YLW CONV (MISCELLANEOUS) ×4 IMPLANT
PAD CAST 4YDX4 CTTN HI CHSV (CAST SUPPLIES) ×2 IMPLANT
PADDING CAST COTTON 4X4 STRL (CAST SUPPLIES) ×2
PASSER SUT SWANSON 36MM LOOP (INSTRUMENTS) IMPLANT
SPONGE LAP 4X18 RFD (DISPOSABLE) ×4 IMPLANT
STAPLER VISISTAT 35W (STAPLE) ×2 IMPLANT
STOCKINETTE IMPERVIOUS 9X36 MD (GAUZE/BANDAGES/DRESSINGS) ×2 IMPLANT
SUCTION FRAZIER HANDLE 10FR (MISCELLANEOUS) ×2
SUCTION TUBE FRAZIER 10FR DISP (MISCELLANEOUS) ×1 IMPLANT
SUT FIBERWIRE #2 38 REV NDL BL (SUTURE)
SUT STEEL 5 V 56 M (SUTURE) ×2 IMPLANT
SUT VIC AB 0 CT1 27 (SUTURE) ×2
SUT VIC AB 0 CT1 27XBRD ANBCTR (SUTURE) ×1 IMPLANT
SUT VIC AB 2-0 CT1 27 (SUTURE) ×4
SUT VIC AB 2-0 CT1 TAPERPNT 27 (SUTURE) ×2 IMPLANT
SUTURE FIBERWR#2 38 REV NDL BL (SUTURE) IMPLANT
SYR CONTROL 10ML LL (SYRINGE) IMPLANT
TOWEL GREEN STERILE (TOWEL DISPOSABLE) ×2 IMPLANT
TOWEL GREEN STERILE FF (TOWEL DISPOSABLE) ×2 IMPLANT
TUBE CONNECTING 12X1/4 (SUCTIONS) ×2 IMPLANT
UNDERPAD 30X36 HEAVY ABSORB (UNDERPADS AND DIAPERS) ×2 IMPLANT
WATER STERILE IRR 1000ML POUR (IV SOLUTION) ×2 IMPLANT
YANKAUER SUCT BULB TIP NO VENT (SUCTIONS) IMPLANT

## 2020-05-23 NOTE — Anesthesia Procedure Notes (Addendum)
Anesthesia Regional Block: Adductor canal block   Pre-Anesthetic Checklist: ,, timeout performed, Correct Patient, Correct Site, Correct Laterality, Correct Procedure, Correct Position, site marked, Risks and benefits discussed,  Surgical consent,  Pre-op evaluation,  At surgeon's request and post-op pain management  Laterality: Left  Prep: chloraprep       Needles:  Injection technique: Single-shot  Needle Type: Echogenic Needle     Needle Length: 9cm  Needle Gauge: 21     Additional Needles:   Procedures:,,,, ultrasound used (permanent image in chart),,,,  Narrative:  Start time: 05/23/2020 2:22 PM End time: 05/23/2020 2:27 PM Injection made incrementally with aspirations every 5 mL.  Performed by: Personally  Anesthesiologist: Suzette Battiest, MD

## 2020-05-23 NOTE — Transfer of Care (Signed)
Immediate Anesthesia Transfer of Care Note  Patient: [name]  Procedure(s) Performed: LEFT KNEE POLE PATELLA EXCISION (Left Knee)  Patient Location: PACU  Anesthesia Type:GA combined with regional for post-op pain  Level of Consciousness: awake, alert  and oriented  Airway & Oxygen Therapy: Patient Spontanous Breathing and Patient connected to nasal cannula oxygen  Post-op Assessment: Report given to RN and Post -op Vital signs reviewed and stable  Post vital signs: Reviewed and stable  Last Vitals:  Vitals Value Taken Time  BP 155/83 05/23/20 1640  Temp    Pulse 75 05/23/20 1643  Resp 17 05/23/20 1643  SpO2 100 % 05/23/20 1643  Vitals shown include unvalidated device data.  Last Pain:  Vitals:   05/23/20 1320  TempSrc:   PainSc: 7       Patients Stated Pain Goal: 3 (67/67/20 9470)  Complications: No complications documented.

## 2020-05-23 NOTE — Anesthesia Postprocedure Evaluation (Signed)
Anesthesia Post Note  Patient: [place]  Procedure(s) Performed: LEFT KNEE POLE PATELLA EXCISION (Left Knee)     Patient location during evaluation: PACU Anesthesia Type: General and Regional Level of consciousness: awake and alert Pain management: pain level controlled Vital Signs Assessment: post-procedure vital signs reviewed and stable Respiratory status: spontaneous breathing, nonlabored ventilation and respiratory function stable Cardiovascular status: blood pressure returned to baseline and stable Postop Assessment: no apparent nausea or vomiting Anesthetic complications: no   No complications documented.  Last Vitals:  Vitals:   05/23/20 1641 05/23/20 1656  BP: (!) 155/83 139/77  Pulse: 78 62  Resp: 11 16  Temp:    SpO2: 100% 100%    Last Pain:  Vitals:   05/23/20 1320  TempSrc:   PainSc: 7                  Ezelle Surprenant,W. EDMOND

## 2020-05-23 NOTE — H&P (Signed)
[place] is an 75 y.o. female.   Chief Complaint: Left knee pain HPI: Marissa Roberts is a 75 year old patient with left knee pain.  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  Several months after surgery she had an inferior pole patella fracture with small fragment which is displaced about a centimeter.  She has had significant pain since that time.  She has failed conservative treatment options including therapy mobilization activity modification and medication.  Localizes pain to the anterior aspect of the knee.  She does have a functional extensor mechanism.  Knee otherwise is functional and intact.  There does not appear to be any loosening or infection in the knee to account for the patient's symptoms.  Past Medical History:  Diagnosis Date  . Anemia   . Arthritis   . Bronchitis    hx of  . Diabetes mellitus    type 2  . Family history of adverse reaction to anesthesia    per patient, "daughter threw up after a knee surgery"  . GERD (gastroesophageal reflux disease)   . Hypercholesteremia   . Hypertension   . Hypothyroidism   . Pneumonia    hx of  . PONV (postoperative nausea and vomiting)   . Thyroid disease     Past Surgical History:  Procedure Laterality Date  . ABDOMINAL HYSTERECTOMY     partial  . BREAST SURGERY     lumpectomy left breast  . carpel tunnel    . COLONOSCOPY W/ POLYPECTOMY    . FOOT SURGERY    . knee arhtroscopy    . KNEE ARTHROPLASTY    . SHOULDER ARTHROSCOPY WITH SUBACROMIAL DECOMPRESSION, ROTATOR CUFF REPAIR AND BICEP TENDON REPAIR Right 05/26/2012   Procedure: Right Shoulder Diagnostic Operative Arthroscopy, Subacromial Decompression, Biceps Tenodesis, Mini Open Rotator Cuff Repair;  Surgeon: [name], MD;  Location: [place];  Service: Orthopedics;  Laterality: Right;  Right Shoulder Diagnostic Operative Arthroscopy, Subacromial Decompression, Biceps Tenodesis, Mini Open Rotator Cuff Repair  . TONSILLECTOMY    . TOTAL KNEE ARTHROPLASTY  Left 07/01/2019   Procedure: LEFT TOTAL KNEE ARTHROPLASTY;  Surgeon: [name], MD;  Location: [facility];  Service: Orthopedics;  Laterality: Left;    Family History  Problem Relation Age of Onset  . Diabetes Mother   . Heart disease Mother   . Hyperlipidemia Mother   . Diabetes Father   . Diabetes Sister   . Hypertension Sister   . Heart disease Sister   . Dementia Sister   . Heart disease Brother   . Healthy Daughter   . Healthy Daughter   . Healthy Daughter   . Healthy Son    Social History:  reports that she quit smoking about 30 years ago. She has never used smokeless tobacco. She reports previous alcohol use. She reports that she does not use drugs.  Allergies:  Allergies  Allergen Reactions  . Contrast Media [Iodinated Diagnostic Agents]     Throat swells from ct contra  . Adhesive [Tape]     No paper tape, causes blisters  . Erythromycin Rash  . Penicillins Rash    Did it involve swelling of the face/tongue/throat, SOB, or low BP? No Did it involve sudden or severe rash/hives, skin peeling, or any reaction on the inside of your mouth or nose? No Did you need to seek medical attention at a hospital or doctor's office? No When did it last happen?Many years If all above answers are "NO", may proceed with cephalosporin use.  Medications Prior to Admission  Medication Sig Dispense Refill  . amLODipine (NORVASC) 10 MG tablet Take 1 tablet (10 mg total) by mouth daily. 90 tablet 3  . Biotin 10000 MCG TABS Take 10,000 mcg by mouth daily.    . Calcium Carb-Cholecalciferol (CALCIUM 600 + D PO) Take 2 tablets by mouth daily.    . cephALEXin (KEFLEX) 500 MG capsule Take 4 capsules (2,000 mg) 30-60 minutes prior to dental procedure (Patient taking differently: Take 2,000 mg by mouth See admin instructions. Take 4 capsules (2,000 mg) 30-60 minutes prior to dental procedure) 8 capsule 0  . dapagliflozin propanediol (FARXIGA) 5 MG TABS tablet Take 1 tablet (5 mg total)  by mouth daily. 90 tablet 3  . diclofenac Sodium (VOLTAREN) 1 % GEL Apply 1 application topically 4 (four) times daily as needed (pain).    . fluticasone (FLONASE) 50 MCG/ACT nasal spray Place 2 sprays into both nostrils 2 (two) times daily as needed for allergies or rhinitis.    . folic acid (FOLVITE) 1 MG tablet Take 2 tablets by mouth once daily (Patient taking differently: Take 2 mg by mouth daily.) 180 tablet 0  . furosemide (LASIX) 40 MG tablet Take 2 tablets by mouth once daily (Patient taking differently: Take 40 mg by mouth daily.) 180 tablet 1  . Glucosamine HCl-MSM (GLUCOSAMINE-MSM PO) Take 1 tablet by mouth daily.    Marland Kitchen leflunomide (ARAVA) 20 MG tablet Take 1 tablet by mouth once daily (Patient taking differently: Take 20 mg by mouth daily.) 90 tablet 0  . levothyroxine (SYNTHROID) 137 MCG tablet Take 1 tablet (137 mcg total) by mouth daily. (Patient taking differently: Take 137 mcg by mouth daily before breakfast.) 90 tablet 0  . linaclotide (LINZESS) 290 MCG CAPS capsule Take 1 capsule (290 mcg total) by mouth daily as needed. (Patient taking differently: Take 290 mcg by mouth daily as needed (constipation).) 90 capsule 3  . metFORMIN (GLUCOPHAGE) 1000 MG tablet TAKE 1 TABLET BY MOUTH TWICE DAILY WITH A MEAL (Patient taking differently: Take 1,000 mg by mouth 2 (two) times daily with a meal.) 180 tablet 0  . methocarbamol (ROBAXIN) 500 MG tablet Take 1 tablet (500 mg total) by mouth every 8 (eight) hours as needed for muscle spasms. 30 tablet 0  . montelukast (SINGULAIR) 10 MG tablet TAKE 1 TABLET BY MOUTH AT BEDTIME 90 tablet 0  . Multiple Vitamin (MULTIVITAMIN WITH MINERALS) TABS tablet Take 1 tablet by mouth daily.    Marland Kitchen NOVOLOG FLEXPEN 100 UNIT/ML FlexPen INJECT 10-15 UNITS SUBCUTANEOUSLY THREE TIMES DAILY BEFORE MEAL(S) (Patient taking differently: Inject 10-15 Units into the skin 3 (three) times daily with meals.) 15 mL 1  . potassium chloride (KLOR-CON) 10 MEQ tablet Take 1 tablet by  mouth twice daily (Patient taking differently: Take 10 mEq by mouth daily.) 180 tablet 0  . rosuvastatin (CRESTOR) 40 MG tablet Take 1 tablet (40 mg total) by mouth daily. (Patient taking differently: Take 40 mg by mouth at bedtime.) 90 tablet 3  . TRESIBA FLEXTOUCH 200 UNIT/ML FlexTouch Pen INJECT 36 UNITS SUBCUTANEOUSLY IN THE MORNING (Patient taking differently: Inject 36 Units into the skin daily.) 9 mL 3  . valsartan-hydrochlorothiazide (DIOVAN-HCT) 320-12.5 MG tablet Take 1 tablet by mouth once daily (Patient taking differently: Take 1 tablet by mouth daily.) 90 tablet 1  . BD PEN NEEDLE NANO U/F 32G X 4 MM MISC USE  4 TIMES DAILY TO  INJECT  INSULIN 200 each 3  . glucose blood (ONETOUCH VERIO)  test strip CHECK BLOOD SUGAR 4 TIMES DAILY 400 each 3  . HUMIRA PEN 40 MG/0.4ML PNKT INJECT 1 PEN UNDER THE SKIN EVERY 14 DAYS. 6 each 0  . Lancets (ONETOUCH DELICA PLUS [id number]) MISC USE TO CHECK BLOOD SUGAR UP TO THREE TIMES A DAY 2 HOURS AFTER A MEAL AND UP TO 3 TIMES A WEEK UPON WAKING UP 300 each 0    Results for orders placed or performed during the hospital encounter of 05/23/20 (from the past 60 hour(s))  Glucose, capillary     Status: Abnormal   Collection Time: 05/23/20  1:09 PM  Result Value Ref Range   Glucose-Capillary 103 (H) 70 - 99 mg/dL    Comment: Glucose reference range applies only to samples taken after fasting for at least 8 hours.   No results found.  Review of Systems  Musculoskeletal: Positive for arthralgias.  All other systems reviewed and are negative.   Blood pressure (!) 167/59, pulse 69, temperature 97.9 F (36.6 C), temperature source Oral, resp. rate (!) 21, height 5\' 5"  (1.651 m), weight 106 kg, SpO2 98 %. Physical Exam Vitals reviewed.  HENT:     Head: Normocephalic.     Nose: Nose normal.     Mouth/Throat:     Mouth: Mucous membranes are moist.  Eyes:     Pupils: Pupils are equal, round, and reactive to light.  Cardiovascular:     Rate and Rhythm:  Normal rate.     Pulses: Normal pulses.  Pulmonary:     Effort: Pulmonary effort is normal.  Abdominal:     General: Abdomen is flat.  Musculoskeletal:     Cervical back: Normal range of motion.  Skin:    General: Skin is warm.     Capillary Refill: Capillary refill takes less than 2 seconds.  Neurological:     General: No focal deficit present.     Mental Status: She is alert.  Psychiatric:        Mood and Affect: Mood normal.   Ortho exam demonstrates intact surgical incision left knee replacement no effusion.  Extensor mechanism is intact.  Collaterals are stable to varus valgus stress at 0 30 and 90 degrees.  Patient has mild anterolateral tenderness around the patellar tendon but no maltracking.  Small 2 x 2 centimeter rash just lateral to the midportion of the incision but not in the operative field.  Assessment/Plan Impression is left knee pain refractory to nonoperative management.  Several months after her knee replacement she did sustain an inferior pole avulsion fracture which at that time her pain began.  We discussed operative and nonoperative treatment options.  Her extensor mechanism is intact.  I think it is possible that this avulsion could be giving her pain.  However there is a risk to excising the fragment and repairing the part of the extensor mechanism which is attached.  Patient understands the risk and benefits and the pain she is having with this in her mind warrants intervention despite the risks.  Anticipate weightbearing as tolerated but in a knee immobilizer after surgery.  Possibility of incomplete pain relief also discussed.  No other real issues seen with the knee in terms of infection or loosening.  All questions answered  Anderson Malta, MD 05/23/2020, 2:51 PM

## 2020-05-23 NOTE — Brief Op Note (Signed)
   05/23/2020  4:57 PM  PATIENT:  Marissa Roberts  75 y.o. female  PRE-OPERATIVE DIAGNOSIS:  left knee inferior pole patella fracture  POST-OPERATIVE DIAGNOSIS:  left knee inferior pole patella fracture  PROCEDURE:  Procedure(s): LEFT KNEE POLE PATELLA EXCISION  SURGEON:  Surgeon(s): [name], MD  ASSISTANT: green rnfa  ANESTHESIA:   general  EBL: 10 ml    Total I/O In: 100 [IV Piggyback:100] Out: 50 [Blood:50]  BLOOD ADMINISTERED: none  DRAINS: none   LOCAL MEDICATIONS USED:  vancomycin  SPECIMEN:  No Specimen  COUNTS:  YES  TOURNIQUET:   Total Tourniquet Time Documented: Thigh (Left) - 38 minutes Total: Thigh (Left) - 38 minutes   DICTATION: .Other Dictation: Dictation Number [id number]  PLAN OF CARE: Admit for overnight observation  PATIENT DISPOSITION:  PACU - hemodynamically stable

## 2020-05-23 NOTE — Anesthesia Procedure Notes (Signed)
Procedure Name: LMA Insertion Date/Time: 05/23/2020 3:24 PM Performed by: Mariea Clonts, CRNA Pre-anesthesia Checklist: Patient identified, Emergency Drugs available, Suction available and Patient being monitored Patient Re-evaluated:Patient Re-evaluated prior to induction Oxygen Delivery Method: Circle System Utilized Preoxygenation: Pre-oxygenation with 100% oxygen Induction Type: IV induction Ventilation: Mask ventilation without difficulty LMA: LMA inserted LMA Size: 4.0 Number of attempts: 1 Airway Equipment and Method: Bite block Placement Confirmation: positive ETCO2 Tube secured with: Tape Dental Injury: Teeth and Oropharynx as per pre-operative assessment

## 2020-05-24 ENCOUNTER — Telehealth: Payer: Self-pay | Admitting: Orthopedic Surgery

## 2020-05-24 ENCOUNTER — Encounter (HOSPITAL_COMMUNITY): Payer: Self-pay | Admitting: Orthopedic Surgery

## 2020-05-24 DIAGNOSIS — E039 Hypothyroidism, unspecified: Secondary | ICD-10-CM | POA: Diagnosis not present

## 2020-05-24 DIAGNOSIS — I10 Essential (primary) hypertension: Secondary | ICD-10-CM | POA: Diagnosis not present

## 2020-05-24 DIAGNOSIS — Z880 Allergy status to penicillin: Secondary | ICD-10-CM | POA: Diagnosis not present

## 2020-05-24 DIAGNOSIS — Z91041 Radiographic dye allergy status: Secondary | ICD-10-CM | POA: Diagnosis not present

## 2020-05-24 DIAGNOSIS — Z7984 Long term (current) use of oral hypoglycemic drugs: Secondary | ICD-10-CM | POA: Diagnosis not present

## 2020-05-24 DIAGNOSIS — Z79899 Other long term (current) drug therapy: Secondary | ICD-10-CM | POA: Diagnosis not present

## 2020-05-24 DIAGNOSIS — Z87891 Personal history of nicotine dependence: Secondary | ICD-10-CM | POA: Diagnosis not present

## 2020-05-24 DIAGNOSIS — S82002K Unspecified fracture of left patella, subsequent encounter for closed fracture with nonunion: Secondary | ICD-10-CM | POA: Diagnosis not present

## 2020-05-24 DIAGNOSIS — E119 Type 2 diabetes mellitus without complications: Secondary | ICD-10-CM | POA: Diagnosis not present

## 2020-05-24 DIAGNOSIS — Z96652 Presence of left artificial knee joint: Secondary | ICD-10-CM | POA: Diagnosis not present

## 2020-05-24 LAB — GLUCOSE, CAPILLARY: Glucose-Capillary: 228 mg/dL — ABNORMAL HIGH (ref 70–99)

## 2020-05-24 MED ORDER — HYDROCODONE-ACETAMINOPHEN 5-325 MG PO TABS
1.0000 | ORAL_TABLET | Freq: Four times a day (QID) | ORAL | 0 refills | Status: DC | PRN
Start: 2020-05-24 — End: 2020-07-13

## 2020-05-24 MED ORDER — HYDROCODONE-ACETAMINOPHEN 5-325 MG PO TABS: 1.0000 | ORAL_TABLET | ORAL | 0 refills | Status: DC | PRN

## 2020-05-24 NOTE — Evaluation (Signed)
Physical Therapy Evaluation Patient Details Name: Marissa Roberts MRN: [id number] DOB: 1945-04-29 Today's Date: 05/24/2020   History of Present Illness  Pt is 75 yo female s/p L inferior pole patellar excision on 2/22 to address an patellar pole avulsion fracture that occured months after her L TKA. PMH significant for HTN, GERD, DM. and thyroid disease.    Clinical Impression  Pt received in bed, willing to participate in PT and eager to go home. Reviewed precautions with pt and pt demonstrated understanding. Min guard for all mobility for safety. Cueing to push from EOB during sit to stand and for correct sequencing on stairs, but no physical assist given. Pt reported skin irritation around thigh from immobilizer. No redness noted and immobilizer fitted correctly, but educated pt to put thin, unwrinkled layer between skin and immobilizer to prevent irritation. Pt receptive to education. Pt has adequate DME at home from husband's prior THA. Husband is available 24/7 for assistance. Recommend OP PT to address strength, balance, endurance, and ROM. Pt left in bed with all needs met, call bell within reach, and nursing aware of status.    Follow Up Recommendations Outpatient PT    Equipment Recommendations  None recommended by PT    Recommendations for Other Services       Precautions / Restrictions Precautions Precautions: Knee Precaution Comments: No knee flexion until cleared by surgeon Required Braces or Orthoses: Knee Immobilizer - Left Knee Immobilizer - Left: On at all times Restrictions Weight Bearing Restrictions: Yes LLE Weight Bearing: Weight bearing as tolerated      Mobility  Bed Mobility Overal bed mobility: Needs Assistance Bed Mobility: Supine to Sit     Supine to sit: Min guard     General bed mobility comments: Min guard for safety with L LE    Transfers Overall transfer level: Needs assistance Equipment used: Rolling walker (2 wheeled) Transfers: Sit  to/from Stand Sit to Stand: Min guard         General transfer comment: Min guard for safety, cueing to push from EOB and avoid knee flexion  Ambulation/Gait Ambulation/Gait assistance: Min guard Gait Distance (Feet): 200 Feet Assistive device: Rolling walker (2 wheeled) Gait Pattern/deviations: Step-to pattern;Decreased weight shift to left Gait velocity: Decreased   General Gait Details: Safe navigation with RW, decreased weight shift to left due to pain but willing to WBAT, min guard for safety  Stairs Stairs: Yes Stairs assistance: Min guard Stair Management: One rail Right;Forwards;Step to pattern Number of Stairs: 4 General stair comments: Min guard for safety, no physical assist given, cueing to lead with R LE ascending and L LE descending  Wheelchair Mobility    Modified Rankin (Stroke Patients Only)       Balance Overall balance assessment: Needs assistance Sitting-balance support: Feet supported;Single extremity supported Sitting balance-Leahy Scale: Good       Standing balance-Leahy Scale: Good Standing balance comment: Reliant on RW for support with WBAT precaution, no overt LOB                             Pertinent Vitals/Pain Pain Assessment: 0-10 Pain Score: 8  (0 at rest, 8 with movement) Pain Location: L knee near incision Pain Descriptors / Indicators: Discomfort;Aching;Pressure Pain Intervention(s): Monitored during session    Home Living Family/patient expects to be discharged to:: Private residence Living Arrangements: Spouse/significant other Available Help at Discharge: Family;Available 24 hours/day Type of Home: House Home Access: Stairs to enter  Entrance Stairs-Rails: Can reach both Entrance Stairs-Number of Steps: 3 to porch + 1 into house Home Layout: One level Home Equipment: Cane - single point;Bedside commode;Walker - 2 wheels Additional Comments: Pt has equipment from husband's THA. Pt's husband does not use them  anymore, so they are available for pt to use    Prior Function Level of Independence: Independent               Hand Dominance   Dominant Hand: Right    Extremity/Trunk Assessment   Upper Extremity Assessment Upper Extremity Assessment: Overall WFL for tasks assessed    Lower Extremity Assessment Lower Extremity Assessment: Generalized weakness    Cervical / Trunk Assessment Cervical / Trunk Assessment: Normal  Communication   Communication: No difficulties  Cognition Arousal/Alertness: Awake/alert Behavior During Therapy: WFL for tasks assessed/performed Overall Cognitive Status: Within Functional Limits for tasks assessed                                 General Comments: Expressed irritation that immobilizer was rubbing her inner thigh and when PT educated pt that immobilizer needs to be fitted correctly. But made conversation, responded appropriately to questions, and was cooperative/pleasant for rest of PT session      General Comments General comments (skin integrity, edema, etc.): Pt reporting skin irritation from immobilizer. No signs of redness but educated pt on thin unwrinkled layer between skin and immobilizer to alleviate irritation. Pt demonstrated understanding.    Exercises     Assessment/Plan    PT Assessment Patient needs continued PT services  PT Problem List Decreased strength;Decreased mobility;Decreased range of motion;Decreased activity tolerance;Decreased balance;Decreased knowledge of use of DME;Pain       PT Treatment Interventions DME instruction;Therapeutic exercise;Gait training;Balance training;Stair training;Neuromuscular re-education;Functional mobility training;Therapeutic activities    PT Goals (Current goals can be found in the Care Plan section)  Acute Rehab PT Goals Patient Stated Goal: Be able to bend to lift items again PT Goal Formulation: With patient Time For Goal Achievement: 06/07/20 Potential to Achieve  Goals: Good    Frequency Min 3X/week   Barriers to discharge        Co-evaluation               AM-PAC PT "6 Clicks" Mobility  Outcome Measure Help needed turning from your back to your side while in a flat bed without using bedrails?: None Help needed moving from lying on your back to sitting on the side of a flat bed without using bedrails?: None Help needed moving to and from a bed to a chair (including a wheelchair)?: A Little Help needed standing up from a chair using your arms (e.g., wheelchair or bedside chair)?: A Little Help needed to walk in hospital room?: A Little Help needed climbing 3-5 steps with a railing? : A Little 6 Click Score: 20    End of Session Equipment Utilized During Treatment: Gait belt;Left knee immobilizer Activity Tolerance: Patient tolerated treatment well Patient left: in bed Nurse Communication: Mobility status PT Visit Diagnosis: Muscle weakness (generalized) (M62.81);Unsteadiness on feet (R26.81)    Time:  -      Charges:             Rosita Kea, SPT

## 2020-05-24 NOTE — Telephone Encounter (Signed)
Pt called stating her pharmacy told her they couldn't fill her rx due to an error and they would fax it back to Dr.Dean to have corrected; pt wanted to make Dr. Marlou Sa aware this is coming so he can have fixed it by today.   [phone]

## 2020-05-24 NOTE — Progress Notes (Signed)
Pt given D/C instructions with verbal understanding. Rx's were sent to the pharmacy by MD. Pt's incision is clean and dry with no sign of infection. Pt's IV was removed prior to D/C. Pt D/C'd home via wheelchair per MD order. Pt is stable @ D/C and has no other needs at this time. Jaysean Manville, RN  

## 2020-05-24 NOTE — Telephone Encounter (Signed)
I spoke with pharmacy. Dr Marlou Sa to address.

## 2020-05-24 NOTE — Progress Notes (Signed)
  Subjective: Patient doing well.  Knee pain is controlled.  Has been ambulating in room.   Objective: Vital signs in last 24 hours: Temp:  [97.4 F (36.3 C)-98.9 F (37.2 C)] 98.9 F (37.2 C) (02/23 0718) Pulse Rate:  [62-84] 83 (02/23 0718) Resp:  [11-24] 20 (02/23 0718) BP: (136-184)/(47-94) 159/68 (02/23 0718) SpO2:  [92 %-100 %] 97 % (02/23 0718) Weight:  [106 kg] 106 kg (02/22 1302)  Intake/Output from previous day: 02/22 0701 - 02/23 0700 In: 600 [I.V.:500; IV Piggyback:100] Out: 50 [Blood:50] Intake/Output this shift: No intake/output data recorded.  Exam:  Dorsiflexion/Plantar flexion intact No cellulitis present  Labs: No results for input(s): HGB in the last 72 hours. No results for input(s): WBC, RBC, HCT, PLT in the last 72 hours. No results for input(s): NA, K, CL, CO2, BUN, CREATININE, GLUCOSE, CALCIUM in the last 72 hours. No results for input(s): LABPT, INR in the last 72 hours.  Assessment/Plan: Plan at this time is discharged to home.  Weightbearing as tolerated in knee immobilizer.  Resume biologic medication next week.  Prescriptions written.  Sent to the pharmacy.   Landry Dyke Emely Fahy 05/24/2020, 7:41 AM

## 2020-05-24 NOTE — Telephone Encounter (Signed)
Pharmacy stating patients insurance will only cover 8tab/day.  Asking for new rx.

## 2020-05-24 NOTE — Telephone Encounter (Signed)
[facility] called said they need a new prescription for this pt is 12 and they'll only allow 8 a day!   [phone]

## 2020-05-24 NOTE — Telephone Encounter (Signed)
Submitted to pharmacy. Patient advised done.  

## 2020-05-24 NOTE — Op Note (Signed)
NAME: Marissa Roberts, Marissa Roberts MEDICAL RECORD ZO:[id number] ACCOUNT [id number] DATE OF BIRTH:February 29, 1946 FACILITY: [facility] LOCATION: [facility] PHYSICIAN:GREGORY Randel Pigg, MD  OPERATIVE REPORT  DATE OF PROCEDURE:  05/23/2020  PREOPERATIVE DIAGNOSIS:  Left knee inferior pole patellar fracture, symptomatic nonunion.  POSTOPERATIVE DIAGNOSIS:  Left knee inferior pole patellar fracture, symptomatic nonunion.  PROCEDURE:  Excision of inferior pole patellar fracture.  SURGEON:  [name], MD  ASSISTANT:  April Green, RNFA.  INDICATIONS:  The patient is a 75 year old patient with left knee pain.  Had total knee replacement last year, did well for several months.  Sustained an inferior pole patellar fracture which was small but has become symptomatic and has failed  conservative measures.  Presents now for operative excision.  PROCEDURE IN DETAIL:  The patient was brought to the operating room where general anesthetic was induced.  Preoperative antibiotics administered.  Timeout was called.  Left leg prescrubbed with alcohol and Betadine, allowed to air dry.  Prepped with  DuraPrep solution and draped in a sterile manner.  Ioban used to cover the operative field.  Timeout was called.  Leg was elevated, exsanguinated with the Esmarch wrap.  Tourniquet was inflated.  Incision was made off the inferior pole of the patella.   Skin and subcutaneous tissue were sharply divided.  The patellar fragment was identified.  There was a nonunion.  Did have some mobility.  Did not have too much of the extensor mechanism attached, likely less than 10%.  The inferior pole patellar  fracture fragment was removed.  Thorough irrigation of the joint was then performed.  There was some effusion in the joint that was clear.  It did not appear infected.  Joint was then thoroughly irrigated through that incision.  IrriSept solution  utilized and then vancomycin powder placed.  The extensor mechanism was closed using  interrupted #1 Vicryl sutures.  Tourniquet was released.  Thorough irrigation again performed on the superficial aspect which was then closed using 0 Vicryl suture, 2-0  Vicryl suture and 3-0 nylon.  Waterproof dressing and Ace wrap and knee immobilizer placed.  The patient tolerated the procedure well without immediate complications.  Transferred to the recovery room in stable condition.  HN/NUANCE  D:05/23/2020 T:05/23/2020 JOB:[id number]

## 2020-05-30 ENCOUNTER — Other Ambulatory Visit: Payer: Self-pay | Admitting: Rheumatology

## 2020-05-31 ENCOUNTER — Other Ambulatory Visit: Payer: Self-pay

## 2020-05-31 ENCOUNTER — Encounter: Payer: Self-pay | Admitting: Orthopedic Surgery

## 2020-05-31 ENCOUNTER — Ambulatory Visit (INDEPENDENT_AMBULATORY_CARE_PROVIDER_SITE_OTHER): Payer: Medicare Other | Admitting: Orthopedic Surgery

## 2020-05-31 DIAGNOSIS — Z96652 Presence of left artificial knee joint: Secondary | ICD-10-CM

## 2020-05-31 NOTE — Progress Notes (Signed)
Post-Op Visit Note   Patient: Marissa Roberts           Date of Birth: October 10, 1945           MRN: [id number] Visit Date: 05/31/2020 PCP: [name], MD   Assessment & Plan:  Chief Complaint:  Chief Complaint  Patient presents with  . Left Knee - Routine Post Op   Visit Diagnoses:  1. Status post total left knee replacement     Plan: Patient is a 75 year old female presents s/p left knee inferior pole patella fracture fragment excision on 05/23/2020. She is doing well following surgery. She states that all of her preoperative pain is gone and she feels much better. She takes occasional hydrocodone for pain control following surgery. She has discontinued her knee immobilizer. Denies any fevers, chills, drainage. No effusion on exam. Incision is healing well and sutures are removed and replaced with Steri-Strips. No evidence of infection or dehiscence around the incision. Plan to start physical therapy comprising of range of motion of the knee from 0 to 90 degrees and straight leg raises. Avoid stairs. Follow-up in 4 weeks for clinical recheck with Dr. Marlou Sa. Patient proved plan. Negative Homans' sign. No calf tenderness.  Follow-Up Instructions: No follow-ups on file.   Orders:  No orders of the defined types were placed in this encounter.  No orders of the defined types were placed in this encounter.   Imaging: No results found.  PMFS History: Patient Active Problem List   Diagnosis Date Noted  . Arthritis of knee 05/23/2020  . Pain in knee region after total knee replacement ([place]) 03/22/2020  . Rash 10/11/2019  . Arthritis of left knee 07/01/2019  . Hypercholesterolemia 04/20/2019  . B12 deficiency 03/23/2019  . Anemia 02/05/2019  . 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 High risk medication use 01/25/2019  . Fatigue 09/18/2018  . Routine general medical examination at a health  care facility 02/07/2015  . Morbid obesity ([place]) 04/19/2013  . Hyperlipidemia associated with type 2 diabetes mellitus ([place]) 07/28/2009  . Osteopenia 03/14/2009  . Diabetes mellitus type 2 with complications ([place]) 40/98/1191  . Hypothyroidism 09/24/2006  . Essential hypertension 09/24/2006   Past Medical History:  Diagnosis Date  . Anemia   . Arthritis   . Bronchitis    hx of  . Diabetes mellitus    type 2  . Family history of adverse reaction to anesthesia    per patient, "daughter threw up after a knee surgery"  . GERD (gastroesophageal reflux disease)   . Hypercholesteremia   . Hypertension   . Hypothyroidism   . Pneumonia    hx of  . PONV (postoperative nausea and vomiting)   . Thyroid disease     Family History  Problem Relation Age of Onset  . Diabetes Mother   . Heart disease Mother   . Hyperlipidemia Mother   . Diabetes Father   . Diabetes Sister   . Hypertension Sister   . Heart disease Sister   . Dementia Sister   . Heart disease Brother   . Healthy Daughter   . Healthy Daughter   . Healthy Daughter   . Healthy Son     Past Surgical History:  Procedure Laterality Date  . ABDOMINAL HYSTERECTOMY     partial  . BREAST SURGERY     lumpectomy left breast  . carpel tunnel    . COLONOSCOPY W/ POLYPECTOMY    .  FOOT SURGERY    . knee arhtroscopy    . KNEE ARTHROPLASTY    . ORIF PATELLA Left 05/23/2020   Procedure: LEFT KNEE POLE PATELLA EXCISION;  Surgeon: [name], MD;  Location: [facility];  Service: Orthopedics;  Laterality: Left;  . SHOULDER ARTHROSCOPY WITH SUBACROMIAL DECOMPRESSION, ROTATOR CUFF REPAIR AND BICEP TENDON REPAIR Right 05/26/2012   Procedure: Right Shoulder Diagnostic Operative Arthroscopy, Subacromial Decompression, Biceps Tenodesis, Mini Open Rotator Cuff Repair;  Surgeon: [name], MD;  Location: [facility];  Service: Orthopedics;  Laterality: Right;  Right Shoulder Diagnostic Operative Arthroscopy, Subacromial Decompression,  Biceps Tenodesis, Mini Open Rotator Cuff Repair  . TONSILLECTOMY    . TOTAL KNEE ARTHROPLASTY Left 07/01/2019   Procedure: LEFT TOTAL KNEE ARTHROPLASTY;  Surgeon: [name], MD;  Location: [facility];  Service: Orthopedics;  Laterality: Left;   Social History   Occupational History  . Not on file  Tobacco Use  . Smoking status: Former Smoker    Quit date: 04/01/1990    Years since quitting: 30.1  . Smokeless tobacco: Never Used  Vaping Use  . Vaping Use: Never used  Substance and Sexual Activity  . Alcohol use: Not Currently    Comment: 06/22/2019: per patient about 20-25 years ago  . Drug use: No  . Sexual activity: Never    Birth control/protection: Abstinence

## 2020-06-01 ENCOUNTER — Telehealth: Payer: Self-pay

## 2020-06-01 NOTE — Telephone Encounter (Signed)
Patient advised the reason her Folic Acid had been denied is because she she no longer taking MTX therefore she no longer has to take the Folic Acid. Patient expressed understanding.

## 2020-06-01 NOTE — Discharge Summary (Signed)
Physician Discharge Summary    Patient ID: Marissa Roberts MRN: [id number] DOB/AGE: 1945/05/02 75 y.o.  Admit date: 05/23/2020 Discharge date: 05/24/2020  Admission Diagnoses:  Active Problems:   Arthritis of knee   Discharge Diagnoses:  Same  Surgeries: Procedure(s): LEFT KNEE POLE PATELLA EXCISION on 05/23/2020   Consultants:   Discharged Condition: Stable  Hospital Course: Marissa Roberts is an 75 y.o. 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  They were brought to the operating room on 05/23/2020 and underwent the above named procedures.  Patient tolerated procedure well and was able to ambulate postop day #1 with diminished pain.  Discharged home in good condition and will follow up with [place] in 10 days.  Antibiotics given:  Anti-infectives (From admission, onward)   Start     Dose/Rate Route Frequency Ordered Stop   05/24/20 0600  ceFAZolin (ANCEF) IVPB 2g/100 mL premix        2 g 200 mL/hr over 30 Minutes Intravenous On call to O.R. 05/23/20 1301 05/23/20 1555   05/23/20 2015  ceFAZolin (ANCEF) IVPB 2g/100 mL premix        2 g 200 mL/hr over 30 Minutes Intravenous Every 6 hours 05/23/20 1919 05/24/20 0730   05/23/20 1521  vancomycin (VANCOCIN) powder  Status:  Discontinued          As needed 05/23/20 1521 05/23/20 1637    .  Recent vital signs:  Vitals:   05/24/20 0335 05/24/20 0718  BP: 136/64 (!) 159/68  Pulse: 81 83  Resp: 20 20  Temp: 97.8 F (36.6 C) 98.9 F (37.2 C)  SpO2: 99% 97%    Recent laboratory studies:  Results for orders placed or performed during the hospital encounter of 05/23/20  Glucose, capillary  Result Value Ref Range   Glucose-Capillary 103 (H) 70 - 99 mg/dL  Glucose, capillary  Result Value Ref Range   Glucose-Capillary 94 70 - 99 mg/dL  Glucose, capillary  Result Value Ref Range   Glucose-Capillary 88 70 - 99 mg/dL   Glucose, capillary  Result Value Ref Range   Glucose-Capillary 287 (H) 70 - 99 mg/dL   Comment 1 Notify RN    Comment 2 Document in Chart   Glucose, capillary  Result Value Ref Range   Glucose-Capillary 228 (H) 70 - 99 mg/dL   Comment 1 Notify RN    Comment 2 Document in Chart     Discharge Medications:   Allergies as of 05/24/2020      Reactions   Contrast Media [iodinated Diagnostic Agents]    Throat swells from ct contra   Adhesive [tape]    No paper tape, causes blisters   Erythromycin Rash   Penicillins Rash   Did it involve swelling of the face/tongue/throat, SOB, or low BP? No Did it involve sudden or severe rash/hives, skin peeling, or any reaction on the inside of your mouth or nose? No Did you need to seek medical attention at a hospital or doctor's office? No When did it last happen?Many years If all above answers are "NO", may proceed with cephalosporin use.      Medication List    TAKE these medications   amLODipine 10 MG tablet Commonly known as: NORVASC Take 1 tablet (10 mg total) by mouth daily.   BD Pen Needle Nano U/F 32G X 4 MM Misc Generic drug: Insulin Pen Needle USE  4 TIMES DAILY TO  INJECT  INSULIN   Biotin 10000 MCG Tabs Take 10,000 mcg by mouth daily.   CALCIUM 600 + D PO Take 2 tablets by mouth daily.   cephALEXin 500 MG capsule Commonly known as: KEFLEX Take 4 capsules (2,000 mg) 30-60 minutes prior to dental procedure What changed:   how much to take  how to take this  when to take this   dapagliflozin propanediol 5 MG Tabs tablet Commonly known as: Farxiga Take 1 tablet (5 mg total) by mouth daily.   diclofenac Sodium 1 % Gel Commonly known as: VOLTAREN Apply 1 application topically 4 (four) times daily as needed (pain).   fluticasone 50 MCG/ACT nasal spray Commonly known as: FLONASE Place 2 sprays into both nostrils 2 (two) times daily as needed for allergies or rhinitis.   furosemide 40 MG tablet Commonly known  as: LASIX Take 2 tablets by mouth once daily What changed: how much to take   GLUCOSAMINE-MSM PO Take 1 tablet by mouth daily.   Humira Pen 40 MG/0.4ML Pnkt Generic drug: Adalimumab INJECT 1 PEN UNDER THE SKIN EVERY 14 DAYS.   leflunomide 20 MG tablet Commonly known as: ARAVA Take 1 tablet by mouth once daily   levothyroxine 137 MCG tablet Commonly known as: SYNTHROID Take 1 tablet (137 mcg total) by mouth daily. What changed: when to take this   linaclotide 290 MCG Caps capsule Commonly known as: LINZESS Take 1 capsule (290 mcg total) by mouth daily as needed. What changed: reasons to take this   metFORMIN 1000 MG tablet Commonly known as: GLUCOPHAGE TAKE 1 TABLET BY MOUTH TWICE DAILY WITH A MEAL   methocarbamol 500 MG tablet Commonly known as: Robaxin Take 1 tablet (500 mg total) by mouth every 8 (eight) hours as needed for muscle spasms.   montelukast 10 MG tablet Commonly known as: SINGULAIR TAKE 1 TABLET BY MOUTH AT BEDTIME   multivitamin with minerals Tabs tablet Take 1 tablet by mouth daily.   NovoLOG FlexPen 100 UNIT/ML FlexPen Generic drug: insulin aspart INJECT 10-15 UNITS SUBCUTANEOUSLY THREE TIMES DAILY BEFORE MEAL(S) What changed:   how much to take  how to take this  when to take this  additional instructions   OneTouch Delica Plus [id number] Misc USE TO CHECK BLOOD SUGAR UP TO THREE TIMES A DAY 2 HOURS AFTER A MEAL AND UP TO 3 TIMES A WEEK UPON WAKING UP   OneTouch Verio test strip Generic drug: glucose blood CHECK BLOOD SUGAR 4 TIMES DAILY   potassium chloride 10 MEQ tablet Commonly known as: KLOR-CON Take 1 tablet by mouth twice daily What changed: when to take this   rosuvastatin 40 MG tablet Commonly known as: CRESTOR Take 1 tablet (40 mg total) by mouth daily. What changed: when to take this   [place] FlexTouch 200 UNIT/ML FlexTouch Pen Generic drug: insulin degludec INJECT 36 UNITS SUBCUTANEOUSLY IN THE MORNING What changed:  See the new instructions.   valsartan-hydrochlorothiazide 320-12.5 MG tablet Commonly known as: DIOVAN-HCT Take 1 tablet by mouth once daily       Diagnostic Studies: DG Knee 1-2 Views Left  Result Date: 05/23/2020 CLINICAL DATA:  Left knee pole patella excision. EXAM: DG C-ARM 1-60 MIN; LEFT KNEE - 1-2 VIEW FLUOROSCOPY TIME:  Fluoroscopy Time:  1.1 seconds. Radiation Exposure Index (if provided by the fluoroscopic device): 10 mGy. COMPARISON:  Radiograph March 08, 2020. FINDINGS: A single C-arm fluoroscopic image was obtained intraoperatively and submitted for post operative interpretation. This  image demonstrates apparent removal of the inferior patellar pole fracture fragment. Prior total knee arthroplasty. Please see the performing provider's procedural report for further detail. IMPRESSION: Intraoperative fluoroscopic imaging, as detailed above. Electronically Signed   By: Margaretha Sheffield MD   On: 05/23/2020 19:34   DG C-Arm 1-60 Min  Result Date: 05/23/2020 CLINICAL DATA:  Left knee pole patella excision. EXAM: DG C-ARM 1-60 MIN; LEFT KNEE - 1-2 VIEW FLUOROSCOPY TIME:  Fluoroscopy Time:  1.1 seconds. Radiation Exposure Index (if provided by the fluoroscopic device): 10 mGy. COMPARISON:  Radiograph March 08, 2020. FINDINGS: A single C-arm fluoroscopic image was obtained intraoperatively and submitted for post operative interpretation. This image demonstrates apparent removal of the inferior patellar pole fracture fragment. Prior total knee arthroplasty. Please see the performing provider's procedural report for further detail. IMPRESSION: Intraoperative fluoroscopic imaging, as detailed above. Electronically Signed   By: Margaretha Sheffield MD   On: 05/23/2020 19:34    Disposition: Discharge disposition: 01-Home or Self Care       Discharge Instructions    Call MD / Call 911   Complete by: As directed    If you experience chest pain or shortness of breath, CALL 911 and be  transported to the hospital emergency room.  If you develope a fever above 101 F, pus (white drainage) or increased drainage or redness at the wound, or calf pain, call your surgeon's office.   Constipation Prevention   Complete by: As directed    Drink plenty of fluids.  Prune juice may be helpful.  You may use a stool softener, such as Colace (over the counter) 100 mg twice a day.  Use MiraLax (over the counter) for constipation as needed.   Diet - low sodium heart healthy   Complete by: As directed    Discharge instructions   Complete by: As directed    Weightbearing as tolerated in knee immobilizer Okay to remove Ace wrap tomorrow.  Dressing underneath is waterproof.  Okay to shower and get the dressing wet Follow-up in approximately 10 days. No need to sleep in the knee immobilizer.  Just use the knee immobilizer when you are walking around   Increase activity slowly as tolerated   Complete by: As directed          Signed: Anderson Malta 06/01/2020, 9:50 AM

## 2020-06-01 NOTE — Telephone Encounter (Signed)
Patient called stating [employer] was not able to refill her prescription of Folic Acid due to Dr. Estanislado Pandy denying the refill.  Patient requested a return call.

## 2020-06-03 DIAGNOSIS — S82032G Displaced transverse fracture of left patella, subsequent encounter for closed fracture with delayed healing: Secondary | ICD-10-CM

## 2020-06-08 ENCOUNTER — Ambulatory Visit: Payer: Medicare Other | Admitting: Orthopedic Surgery

## 2020-06-13 DIAGNOSIS — H401131 Primary open-angle glaucoma, bilateral, mild stage: Secondary | ICD-10-CM | POA: Diagnosis not present

## 2020-06-15 ENCOUNTER — Other Ambulatory Visit: Payer: Self-pay | Admitting: Internal Medicine

## 2020-06-15 ENCOUNTER — Other Ambulatory Visit: Payer: Self-pay | Admitting: Endocrinology

## 2020-06-27 ENCOUNTER — Other Ambulatory Visit: Payer: Self-pay

## 2020-06-27 ENCOUNTER — Encounter: Payer: Self-pay | Admitting: Podiatry

## 2020-06-27 ENCOUNTER — Ambulatory Visit (INDEPENDENT_AMBULATORY_CARE_PROVIDER_SITE_OTHER): Payer: Medicare Other | Admitting: Podiatry

## 2020-06-27 DIAGNOSIS — E119 Type 2 diabetes mellitus without complications: Secondary | ICD-10-CM

## 2020-06-27 DIAGNOSIS — M79674 Pain in right toe(s): Secondary | ICD-10-CM

## 2020-06-27 DIAGNOSIS — Z794 Long term (current) use of insulin: Secondary | ICD-10-CM

## 2020-06-27 DIAGNOSIS — M2041 Other hammer toe(s) (acquired), right foot: Secondary | ICD-10-CM

## 2020-06-27 DIAGNOSIS — M79675 Pain in left toe(s): Secondary | ICD-10-CM

## 2020-06-27 DIAGNOSIS — M2042 Other hammer toe(s) (acquired), left foot: Secondary | ICD-10-CM

## 2020-06-27 DIAGNOSIS — L84 Corns and callosities: Secondary | ICD-10-CM | POA: Diagnosis not present

## 2020-06-27 DIAGNOSIS — B351 Tinea unguium: Secondary | ICD-10-CM | POA: Diagnosis not present

## 2020-06-27 DIAGNOSIS — H401131 Primary open-angle glaucoma, bilateral, mild stage: Secondary | ICD-10-CM | POA: Diagnosis not present

## 2020-06-27 NOTE — Progress Notes (Signed)
Subjective: Marissa Roberts presents today preventative diabetic foot care and painful corn(s) b/l feet and painful mycotic toenails b/l that are difficult to trim. Pain interferes with ambulation. Aggravating factors include wearing enclosed shoe gear. Pain is relieved with periodic professional debridement.   She voices no new pedal problems on today's visit.  Marissa Koch, MD is patient's PCP. Last visit was: 05/04/2020.  She states her blood glucose was 113 mg/dl this morning.   Allergies  Allergen Reactions  . Contrast Media [Iodinated Diagnostic Agents]     Throat swells from ct contra  . Adhesive [Tape]     No paper tape, causes blisters  . Erythromycin Rash  . Penicillins Rash    Did it involve swelling of the face/tongue/throat, SOB, or low BP? No Did it involve sudden or severe rash/hives, skin peeling, or any reaction on the inside of your mouth or nose? No Did you need to seek medical attention at a hospital or doctor's office? No When did it last happen?Many years If all above answers are "NO", may proceed with cephalosporin use.    Objective: [place] is a pleasant 75 y.o. African American female, WD, WN in NAD. AAO x 3.  There were no vitals filed for this visit.  Vascular Examination: Neurovascular status unchanged b/l lower extremities. Capillary fill time to digits <3 seconds b/l lower extremities. Palpable DP pulses b/l. Palpable PT pulses b/l. Pedal hair sparse b/l. Skin temperature gradient within normal limits b/l.  Dermatological Examination: Pedal skin with normal turgor, texture and tone bilaterally. No open wounds bilaterally. No interdigital macerations bilaterally. Toenails 1-5 b/l elongated, discolored, dystrophic, thickened, crumbly with subungual debris and tenderness to dorsal palpation. Hyperkeratotic lesion(s) L hallux, L 5th toe, R hallux and R 5th toe.  No erythema, no edema, no drainage, no fluctuance.    Musculoskeletal: Normal muscle strength 5/5 to all lower extremity muscle groups bilaterally. No pain crepitus or joint limitation noted with ROM b/l. Hammertoes noted to the L 5th toe and R 5th toe. Patient ambulates independent of any assistive aids.  Neurological Examination: Protective sensation intact 5/5 intact bilaterally with 10g monofilament b/l.  Assessment: 1. Pain due to onychomycosis of toenails of both feet   2. Corns and callus   3. Acquired hammertoes of both feet   4. Type 2 diabetes mellitus without complication, with long-term current use of insulin ([place])      Plan: -Examined patient. -No new findings. No new orders. -Continue diabetic foot care principles. -Toenails 1-5 b/l were debrided in length and girth with sterile nail nippers and dremel without iatrogenic bleeding.  -Corn(s) L 5th toe and R 5th toe and callus(es) L hallux and R hallux were pared utilizing sterile scalpel blade without incident. Total number debrided =4. -Patient to report any pedal injuries to medical professional immediately. -Patient/POA to call should there be question/concern in the interim.  Return in about 3 months (around 09/27/2020).  Marissa Roberts, DPM

## 2020-06-28 ENCOUNTER — Ambulatory Visit (INDEPENDENT_AMBULATORY_CARE_PROVIDER_SITE_OTHER): Payer: Medicare Other | Admitting: Orthopedic Surgery

## 2020-06-28 DIAGNOSIS — Z96652 Presence of left artificial knee joint: Secondary | ICD-10-CM

## 2020-07-03 ENCOUNTER — Other Ambulatory Visit: Payer: Self-pay | Admitting: Rheumatology

## 2020-07-03 ENCOUNTER — Other Ambulatory Visit: Payer: Medicare Other

## 2020-07-03 NOTE — Telephone Encounter (Signed)
Next Visit: 09/18/2020  Last Visit: 04/18/2020  Last Fill: 04/03/2020  DX:  Rheumatoid arthritis involving multiple sites with positive rheumatoid factor   Current Dose per office note 04/18/2020, Arava 20mg  po qd  Labs: 05/16/2020, WBC 11.9, Glucose 107, A1C 5.7, Glucose-Capillary 115,   Okay to refill Arava?

## 2020-07-04 ENCOUNTER — Encounter: Payer: Self-pay | Admitting: Orthopedic Surgery

## 2020-07-04 NOTE — Progress Notes (Signed)
Post-Op Visit Note   Patient: Marissa Roberts           Date of Birth: 02/10/1946           MRN: [id number] Visit Date: 06/28/2020 PCP: [name], MD   Assessment & Plan:  Chief Complaint:  Chief Complaint  Patient presents with  . Left Knee - Follow-up   Visit Diagnoses:  1. Status post total left knee replacement     Plan: [name] is a 75 year old patient underwent inferior pole patella excision on 05/23/2020.  This was on the left-hand side.  Still has some pain but is improving.  On exam she has intact incision no effusion good extension strength and she is walking without assistive devices.  Continue with quad strengthening exercises and come back in 3 months for final check.  Follow-Up Instructions: Return in about 3 months (around 09/28/2020).   Orders:  No orders of the defined types were placed in this encounter.  No orders of the defined types were placed in this encounter.   Imaging: No results found.  PMFS History: Patient Active Problem List   Diagnosis Date Noted  . Displaced transverse fracture of left patella, subsequent encounter for closed fracture with delayed healing   . Arthritis of knee 05/23/2020  . Total knee replacement status 04/20/2020  . Pain in knee region after total knee replacement ([place]) 03/22/2020  . Rash 10/11/2019  . Arthritis of left knee 07/01/2019  . Hypercholesterolemia 04/20/2019  . B12 deficiency 03/23/2019  . Anemia 02/05/2019  . Rheumatoid arthritis with rheumatoid factor of multiple sites without organ or systems involvement ([place]) 01/25/2019  . High risk medication use 01/25/2019  . Fatigue 09/18/2018  . Routine general medical examination at a health care facility 02/07/2015  . Morbid obesity ([place]) 04/19/2013  . Hyperlipidemia associated with type 2 diabetes mellitus ([place]) 07/28/2009  . Osteopenia 03/14/2009  . Diabetes mellitus type 2 with complications ([place]) 09/38/1829  . Hypothyroidism 09/24/2006  .  Essential hypertension 09/24/2006   Past Medical History:  Diagnosis Date  . Anemia   . Arthritis   . Bronchitis    hx of  . Diabetes mellitus    type 2  . Family history of adverse reaction to anesthesia    per patient, "daughter threw up after a knee surgery"  . GERD (gastroesophageal reflux disease)   . Hypercholesteremia   . Hypertension   . Hypothyroidism   . Pneumonia    hx of  . PONV (postoperative nausea and vomiting)   . Thyroid disease     Family History  Problem Relation Age of Onset  . Diabetes Mother   . Heart disease Mother   . Hyperlipidemia Mother   . Diabetes Father   . Diabetes Sister   . Hypertension Sister   . Heart disease Sister   . Dementia Sister   . Heart disease Brother   . Healthy Daughter   . Healthy Daughter   . Healthy Daughter   . Healthy Son     Past Surgical History:  Procedure Laterality Date  . ABDOMINAL HYSTERECTOMY     partial  . BREAST SURGERY     lumpectomy left breast  . carpel tunnel    . COLONOSCOPY W/ POLYPECTOMY    . FOOT SURGERY    . knee arhtroscopy    . KNEE ARTHROPLASTY    . ORIF PATELLA Left 05/23/2020   Procedure: LEFT KNEE POLE PATELLA EXCISION;  Surgeon: [name], MD;  Location: [facility];  Service: Orthopedics;  Laterality: Left;  . SHOULDER ARTHROSCOPY WITH SUBACROMIAL DECOMPRESSION, ROTATOR CUFF REPAIR AND BICEP TENDON REPAIR Right 05/26/2012   Procedure: Right Shoulder Diagnostic Operative Arthroscopy, Subacromial Decompression, Biceps Tenodesis, Mini Open Rotator Cuff Repair;  Surgeon: [name], MD;  Location: [facility];  Service: Orthopedics;  Laterality: Right;  Right Shoulder Diagnostic Operative Arthroscopy, Subacromial Decompression, Biceps Tenodesis, Mini Open Rotator Cuff Repair  . TONSILLECTOMY    . TOTAL KNEE ARTHROPLASTY Left 07/01/2019   Procedure: LEFT TOTAL KNEE ARTHROPLASTY;  Surgeon: [name], MD;  Location: [facility];  Service: Orthopedics;  Laterality: Left;   Social  History   Occupational History  . Not on file  Tobacco Use  . Smoking status: Former Smoker    Quit date: 04/01/1990    Years since quitting: 30.2  . Smokeless tobacco: Never Used  Vaping Use  . Vaping Use: Never used  Substance and Sexual Activity  . Alcohol use: Not Currently    Comment: 06/22/2019: per patient about 20-25 years ago  . Drug use: No  . Sexual activity: Never    Birth control/protection: Abstinence

## 2020-07-06 ENCOUNTER — Ambulatory Visit: Payer: Medicare Other | Admitting: Endocrinology

## 2020-07-13 ENCOUNTER — Telehealth: Payer: Self-pay | Admitting: Orthopedic Surgery

## 2020-07-13 ENCOUNTER — Other Ambulatory Visit: Payer: Self-pay | Admitting: Surgical

## 2020-07-13 MED ORDER — TRAMADOL HCL 50 MG PO TABS: 50.0000 mg | ORAL_TABLET | Freq: Every day | ORAL | 0 refills | Status: AC | PRN

## 2020-07-13 NOTE — Telephone Encounter (Signed)
Pt called requesting pain medication tramadol for her leg pain. She would like a call back at [phone]

## 2020-07-13 NOTE — Telephone Encounter (Signed)
Sent in Tramadol.  Almost 2 months out from procedure, no more opioids after this RX

## 2020-07-17 ENCOUNTER — Other Ambulatory Visit: Payer: Self-pay | Admitting: Endocrinology

## 2020-07-17 ENCOUNTER — Other Ambulatory Visit: Payer: Self-pay

## 2020-07-17 ENCOUNTER — Other Ambulatory Visit (INDEPENDENT_AMBULATORY_CARE_PROVIDER_SITE_OTHER): Payer: Medicare Other

## 2020-07-17 DIAGNOSIS — E119 Type 2 diabetes mellitus without complications: Secondary | ICD-10-CM | POA: Diagnosis not present

## 2020-07-17 DIAGNOSIS — Z794 Long term (current) use of insulin: Secondary | ICD-10-CM | POA: Diagnosis not present

## 2020-07-17 DIAGNOSIS — E039 Hypothyroidism, unspecified: Secondary | ICD-10-CM | POA: Diagnosis not present

## 2020-07-17 LAB — COMPREHENSIVE METABOLIC PANEL
ALT: 19 U/L (ref 0–35)
AST: 17 U/L (ref 0–37)
Albumin: 4 g/dL (ref 3.5–5.2)
Alkaline Phosphatase: 45 U/L (ref 39–117)
BUN: 12 mg/dL (ref 6–23)
CO2: 28 mEq/L (ref 19–32)
Calcium: 10.2 mg/dL (ref 8.4–10.5)
Chloride: 104 mEq/L (ref 96–112)
Creatinine, Ser: 0.72 mg/dL (ref 0.40–1.20)
GFR: 83.25 mL/min (ref >60.00)
Glucose, Bld: 107 mg/dL — ABNORMAL HIGH (ref 70–99)
Potassium: 3.8 mEq/L (ref 3.5–5.1)
Sodium: 141 mEq/L (ref 135–145)
Total Bilirubin: 0.4 mg/dL (ref 0.2–1.2)
Total Protein: 7.1 g/dL (ref 6.0–8.3)

## 2020-07-17 LAB — TSH: TSH: 0.32 u[IU]/mL — ABNORMAL LOW (ref 0.35–4.50)

## 2020-07-17 LAB — MICROALBUMIN / CREATININE URINE RATIO
Creatinine,U: 19 mg/dL
Microalb Creat Ratio: 53.6 mg/g — ABNORMAL HIGH (ref 0.0–30.0)
Microalb, Ur: 10.2 mg/dL — ABNORMAL HIGH (ref 0.0–1.9)

## 2020-07-17 LAB — HEMOGLOBIN A1C: Hgb A1c MFr Bld: 6.4 % (ref 4.6–6.5)

## 2020-07-17 NOTE — Telephone Encounter (Signed)
Pt was called and informed and stated understanding  

## 2020-07-18 DIAGNOSIS — E119 Type 2 diabetes mellitus without complications: Secondary | ICD-10-CM | POA: Diagnosis not present

## 2020-07-18 DIAGNOSIS — R1084 Generalized abdominal pain: Secondary | ICD-10-CM | POA: Diagnosis not present

## 2020-07-18 DIAGNOSIS — Z1211 Encounter for screening for malignant neoplasm of colon: Secondary | ICD-10-CM | POA: Diagnosis not present

## 2020-07-24 ENCOUNTER — Other Ambulatory Visit: Payer: Self-pay

## 2020-07-24 ENCOUNTER — Ambulatory Visit: Payer: Medicare Other | Admitting: Endocrinology

## 2020-07-24 ENCOUNTER — Encounter: Payer: Self-pay | Admitting: Endocrinology

## 2020-07-24 VITALS — BP 146/78 | HR 80 | Ht 65.0 in | Wt 234.0 lb

## 2020-07-24 DIAGNOSIS — I10 Essential (primary) hypertension: Secondary | ICD-10-CM

## 2020-07-24 DIAGNOSIS — E063 Autoimmune thyroiditis: Secondary | ICD-10-CM

## 2020-07-24 DIAGNOSIS — E785 Hyperlipidemia, unspecified: Secondary | ICD-10-CM

## 2020-07-24 DIAGNOSIS — Z794 Long term (current) use of insulin: Secondary | ICD-10-CM

## 2020-07-24 DIAGNOSIS — E1165 Type 2 diabetes mellitus with hyperglycemia: Secondary | ICD-10-CM | POA: Diagnosis not present

## 2020-07-24 DIAGNOSIS — E1169 Type 2 diabetes mellitus with other specified complication: Secondary | ICD-10-CM

## 2020-07-24 MED ORDER — NOVOLOG FLEXPEN 100 UNIT/ML ~~LOC~~ SOPN: PEN_INJECTOR | SUBCUTANEOUS | 1 refills | Status: DC

## 2020-07-24 NOTE — Patient Instructions (Addendum)
Thyroid on Saturdays: 1/2 pills   Bring in BP meter to MD office

## 2020-07-24 NOTE — Progress Notes (Signed)
Patient ID: Marissa Roberts, female   DOB: 23-Nov-1945, 75 y.o.   MRN: [id number]               Reason for Appointment:  Follow-up for diabetes and hypertension   History of Present Illness:             Type 2 diabetes mellitus, date of diagnosis: 2005       INSULIN regimen is  Tresiba 34 units am, Novolog  10-14 units bid- tid ac  Oral hypoglycemic drugs the patient is taking are: Metformin 2 g daily, Farxiga 5 mg daily   Current blood sugar patterns, management and problems identified:  Her A1c is usually below 6% normal 6.4    She has been on Farxiga 5 mg since 05/2019 with consistent improvement in her control  She was told to reduce her insulin further on her last visit in 12/21 but she did not also  Also not clear what her postprandial readings are with only morning readings being done mostly  Weight is about the same  As before she will try to adjust her insulin based on what she is eating but sometimes overestimate  This is despite taking NovoLog after finishing eating  Morning sugars are trending higher but not consistently  As before is not able to do much exercise     Dinner usually at 6-7pm  Side effects from medications have been:none  Compliance with the medical regimen: Fair  Glucose monitoring:  done 1 time a day         Glucometer:  One Touch.       Blood Glucose data from review of monitor download:   PRE-MEAL Fasting Lunch Dinner Bedtime Overall  Glucose range: 88-148      Mean/median:  114    116   POST-MEAL PC Breakfast PC Lunch PC Dinner  Glucose range:   156  142  Mean/median:      Previously  FASTING range 82-118 AVERAGE 101    Self-care:   Meals: 3 meals per day. Breakfast is usually grits with or without eggs or meat Not always restricting fat intake or sweet tea  when eating out        Exercise:  Minimal, knee pain  Dietician visit, most recent:2014.               Weight history:  Wt Readings from Last 3  Encounters:  07/24/20 234 lb (106.1 kg)  05/23/20 233 lb 11.2 oz (106 kg)  05/16/20 233 lb 11.2 oz (106 kg)    Glycemic control:   Lab Results  Component Value Date   HGBA1C 6.4 07/17/2020   HGBA1C 5.7 (H) 05/16/2020   HGBA1C 5.7 02/29/2020   Lab Results  Component Value Date   MICROALBUR 10.2 (H) 07/17/2020   [place] 61 08/24/2019   CREATININE 0.72 07/17/2020   No results found for: FRUCTOSAMINE    Past history:  She thinks her blood sugars were only mildly increased at borderline levels at the onsetper Most likely she was treated with metformin initially and this has been continued.   At some point she was also tried on Tonica and Byetta for improving her control. She is not sure why she was started on insulin 5 years ago, presumably for worsening hyperglycemia Also not clear if she has tried various insulin regimens before starting Lantus and NovoLog  Previously had tried the V-go pump but subsequently she stopped doing the V-go pump because of the cost, skin  irritation and local discomfort   Allergies as of 07/24/2020      Reactions   Contrast Media [iodinated Diagnostic Agents]    Throat swells from ct contra   Adhesive [tape]    No paper tape, causes blisters   Erythromycin Rash   Penicillins Rash   Did it involve swelling of the face/tongue/throat, SOB, or low BP? No Did it involve sudden or severe rash/hives, skin peeling, or any reaction on the inside of your mouth or nose? No Did you need to seek medical attention at a hospital or doctor's office? No When did it last happen?Many years If all above answers are "NO", may proceed with cephalosporin use.      Medication List       Accurate as of July 24, 2020 11:14 AM. If you have any questions, ask your nurse or doctor.        amLODipine 10 MG tablet Commonly known as: NORVASC Take 1 tablet (10 mg total) by mouth daily.   BD Pen Needle Nano U/F 32G X 4 MM Misc Generic drug: Insulin Pen  Needle USE  4 TIMES DAILY TO  INJECT  INSULIN   Biotin 10000 MCG Tabs Take 10,000 mcg by mouth daily.   CALCIUM 600 + D PO Take 2 tablets by mouth daily.   cephALEXin 500 MG capsule Commonly known as: KEFLEX Take 4 capsules (2,000 mg) 30-60 minutes prior to dental procedure What changed:   how much to take  how to take this  when to take this   dapagliflozin propanediol 5 MG Tabs tablet Commonly known as: Farxiga Take 1 tablet (5 mg total) by mouth daily.   diclofenac Sodium 1 % Gel Commonly known as: VOLTAREN Apply 1 application topically 4 (four) times daily as needed (pain).   fluticasone 50 MCG/ACT nasal spray Commonly known as: FLONASE Place 2 sprays into both nostrils 2 (two) times daily as needed for allergies or rhinitis.   furosemide 40 MG tablet Commonly known as: LASIX Take 2 tablets by mouth once daily What changed: how much to take   GLUCOSAMINE-MSM PO Take 1 tablet by mouth daily.   Humira Pen 40 MG/0.4ML Pnkt Generic drug: Adalimumab INJECT 1 PEN UNDER THE SKIN EVERY 14 DAYS.   latanoprost 0.005 % ophthalmic solution Commonly known as: XALATAN SMARTSIG:1 Drop(s) In Eye(s) Every Evening   leflunomide 20 MG tablet Commonly known as: ARAVA Take 1 tablet by mouth once daily   levothyroxine 137 MCG tablet Commonly known as: SYNTHROID Take 1 tablet by mouth once daily   linaclotide 290 MCG Caps capsule Commonly known as: LINZESS Take 1 capsule (290 mcg total) by mouth daily as needed. What changed: reasons to take this   metFORMIN 1000 MG tablet Commonly known as: GLUCOPHAGE TAKE 1 TABLET BY MOUTH TWICE DAILY WITH A MEAL   methocarbamol 500 MG tablet Commonly known as: Robaxin Take 1 tablet (500 mg total) by mouth every 8 (eight) hours as needed for muscle spasms.   montelukast 10 MG tablet Commonly known as: SINGULAIR TAKE 1 TABLET BY MOUTH AT BEDTIME   multivitamin with minerals Tabs tablet Take 1 tablet by mouth daily.   NovoLOG  FlexPen 100 UNIT/ML FlexPen Generic drug: insulin aspart INJECT 10-15 UNITS SUBCUTANEOUSLY THREE TIMES DAILY BEFORE MEAL(S) What changed:   how much to take  how to take this  when to take this  additional instructions   OneTouch Delica Plus [id number] Misc USE TO CHECK BLOOD SUGAR UP TO THREE TIMES  A DAY 2 HOURS AFTER A MEAL AND UP TO 3 TIMES A WEEK UPON WAKING UP   OneTouch Verio test strip Generic drug: glucose blood CHECK BLOOD SUGAR 4 TIMES DAILY   potassium chloride 10 MEQ tablet Commonly known as: KLOR-CON Take 1 tablet by mouth twice daily What changed: when to take this   rosuvastatin 40 MG tablet Commonly known as: CRESTOR Take 1 tablet (40 mg total) by mouth daily. What changed: when to take this   traMADol 50 MG tablet Commonly known as: Ultram Take 1 tablet (50 mg total) by mouth daily as needed.   Tyler Aas FlexTouch 200 UNIT/ML FlexTouch Pen Generic drug: insulin degludec INJECT 36 UNITS SUBCUTANEOUSLY IN THE MORNING What changed: See the new instructions.   valsartan-hydrochlorothiazide 320-12.5 MG tablet Commonly known as: DIOVAN-HCT Take 1 tablet by mouth once daily       Allergies:  Allergies  Allergen Reactions  . Contrast Media [Iodinated Diagnostic Agents]     Throat swells from ct contra  . Adhesive [Tape]     No paper tape, causes blisters  . Erythromycin Rash  . Penicillins Rash    Did it involve swelling of the face/tongue/throat, SOB, or low BP? No Did it involve sudden or severe rash/hives, skin peeling, or any reaction on the inside of your mouth or nose? No Did you need to seek medical attention at a hospital or doctor's office? No When did it last happen?Many years If all above answers are "NO", may proceed with cephalosporin use.     Past Medical History:  Diagnosis Date  . Anemia   . Arthritis   . Bronchitis    hx of  . Diabetes mellitus    type 2  . Family history of adverse reaction to anesthesia    per  patient, "daughter threw up after a knee surgery"  . GERD (gastroesophageal reflux disease)   . Hypercholesteremia   . Hypertension   . Hypothyroidism   . Pneumonia    hx of  . PONV (postoperative nausea and vomiting)   . Thyroid disease     Past Surgical History:  Procedure Laterality Date  . ABDOMINAL HYSTERECTOMY     partial  . BREAST SURGERY     lumpectomy left breast  . carpel tunnel    . COLONOSCOPY W/ POLYPECTOMY    . FOOT SURGERY    . knee arhtroscopy    . KNEE ARTHROPLASTY    . ORIF PATELLA Left 05/23/2020   Procedure: LEFT KNEE POLE PATELLA EXCISION;  Surgeon: [name], MD;  Location: [facility];  Service: Orthopedics;  Laterality: Left;  . SHOULDER ARTHROSCOPY WITH SUBACROMIAL DECOMPRESSION, ROTATOR CUFF REPAIR AND BICEP TENDON REPAIR Right 05/26/2012   Procedure: Right Shoulder Diagnostic Operative Arthroscopy, Subacromial Decompression, Biceps Tenodesis, Mini Open Rotator Cuff Repair;  Surgeon: [name], MD;  Location: [facility];  Service: Orthopedics;  Laterality: Right;  Right Shoulder Diagnostic Operative Arthroscopy, Subacromial Decompression, Biceps Tenodesis, Mini Open Rotator Cuff Repair  . TONSILLECTOMY    . TOTAL KNEE ARTHROPLASTY Left 07/01/2019   Procedure: LEFT TOTAL KNEE ARTHROPLASTY;  Surgeon: [name], MD;  Location: [facility];  Service: Orthopedics;  Laterality: Left;    Family History  Problem Relation Age of Onset  . Diabetes Mother   . Heart disease Mother   . Hyperlipidemia Mother   . Diabetes Father   . Diabetes Sister   . Hypertension Sister   . Heart disease Sister   . Dementia Sister   .  Heart disease Brother   . Healthy Daughter   . Healthy Daughter   . Healthy Daughter   . Healthy Son     Social History:  reports that she quit smoking about 30 years ago. She has never used smokeless tobacco. She reports previous alcohol use. She reports that she does not use drugs.    Review of Systems   HYPERTENSION: She is on  amlodipine 5 mg once daily and Diovan HCT 320/12.5  Also has microalbuminuria and because of this as well as tendency to edema she was tried on diltiazem but she felt that this was causing hair loss and constipation   Her blood pressure is upper normal  Home BP 120s-144 with using a wrist instrument that has not been calibrated  She is checking blood pressure at home  BP Readings from Last 3 Encounters:  07/24/20 (!) 146/78  05/24/20 (!) 159/68  05/16/20 (!) 144/61    On Lasix 40 mg for edema She may have some swelling at her ankles at the end of the day    LIPIDS:  She is taking rosuvastatin 40 mg, previously LDL was high with simvastatin 20 mg LDL below 100 Crestor prescribed by PCP      Lab Results  Component Value Date   CHOL 133 08/24/2019   HDL 59.70 08/24/2019   LDLCALC 61 08/24/2019   LDLDIRECT 129.5 02/06/2012   TRIG 64.0 08/24/2019   CHOLHDL 2 08/24/2019                  Thyroid:      She has been hypothyroid for over 40 years and is taking a supplement of 137 g levothyroxine, 6 tablets per week She takes levothyroxine before breakfast daily Has not needed a change in her dosage for some time  TSH has been consistently normal but now it is slightly low  Last labs:   Lab Results  Component Value Date   TSH 0.32 (L) 07/17/2020   TSH 2.03 02/29/2020   TSH 1.75 08/24/2019   FREET4 1.58 08/24/2019   FREET4 1.11 07/21/2018   FREET4 1.05 10/10/2017     Physical Examination:  BP (!) 146/78   Pulse 80   Ht 5\' 5"  (1.651 m)   Wt 234 lb (106.1 kg)   SpO2 96%   BMI 38.94 kg/m   No ankle edema present bilaterally   ASSESSMENT/PLAN:  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:  See history of present illness for detailed discussion of current diabetes management, blood sugar patterns and problems identified  She is on basal bolus insulin, Farxiga and metformin  A1c is 6.4 and slightly higher than usual  Although blood sugars are averaging only 114 in  the mornings not clear if she has sometimes high readings after meals Weight has leveled off Unable to assess her NovoLog doses because of lack of postprandial monitoring  Recommendations:   No change in TRESIBA  Consider freestyle libre for continuous glucose monitoring and given her patient information to review as well as discussed how this would work as well as ability to help her adjust her mealtime dose  She will try to get this from [facility] the form will be filled out  Needs more exercise and to look into water aerobics  Discussed postprandial targets with her normal  HYPERTENSION with microalbuminuria: Blood pressure is high normal but she appears to be having better readings at home Since she does not have any increased edema today we will continue 10  mg Norvasc Microalbumin still overall in the same range  She will compare her home readings on the blood pressure in the office readings on upcoming visit with PCP  Last microalbumin level was 31  EDEMA: With Lasix 40 mg daily this is consistently controlled  HYPOTHYROIDISM: her TSH is slightly low and she will take half tablet of the 137 dose on Saturdays in addition to skipping the Sunday dose Will consider switching to the daily dose on the next visit  Needs follow-up lipids from her PCP  There are no Patient Instructions on file for this visit.   Elayne Snare 07/24/2020, 11:14 AM   Note: This office note was prepared with Dragon voice recognition system technology. Any transcriptional errors that result from this process are unintentional.

## 2020-07-28 ENCOUNTER — Telehealth: Payer: Self-pay

## 2020-07-28 NOTE — Telephone Encounter (Signed)
Call placed to pt reference previous interest in PREP Doing well post surgery still having pain No longer wishes to participate Asked that she call us if she changes her mind in the future

## 2020-07-30 HISTORY — PX: UPPER GI ENDOSCOPY: SHX6162

## 2020-07-30 HISTORY — PX: COLONOSCOPY: SHX174

## 2020-07-31 DIAGNOSIS — K2270 Barrett's esophagus without dysplasia: Secondary | ICD-10-CM | POA: Diagnosis not present

## 2020-07-31 DIAGNOSIS — Z86010 Personal history of colonic polyps: Secondary | ICD-10-CM | POA: Diagnosis not present

## 2020-07-31 DIAGNOSIS — K5909 Other constipation: Secondary | ICD-10-CM | POA: Diagnosis not present

## 2020-08-07 ENCOUNTER — Other Ambulatory Visit: Payer: Self-pay | Admitting: Internal Medicine

## 2020-08-07 ENCOUNTER — Other Ambulatory Visit: Payer: Self-pay | Admitting: Endocrinology

## 2020-08-09 DIAGNOSIS — Z01818 Encounter for other preprocedural examination: Secondary | ICD-10-CM | POA: Diagnosis not present

## 2020-08-09 DIAGNOSIS — K2270 Barrett's esophagus without dysplasia: Secondary | ICD-10-CM | POA: Diagnosis not present

## 2020-08-09 DIAGNOSIS — Z1211 Encounter for screening for malignant neoplasm of colon: Secondary | ICD-10-CM | POA: Diagnosis not present

## 2020-08-09 DIAGNOSIS — E119 Type 2 diabetes mellitus without complications: Secondary | ICD-10-CM | POA: Diagnosis not present

## 2020-08-17 DIAGNOSIS — K2270 Barrett's esophagus without dysplasia: Secondary | ICD-10-CM | POA: Diagnosis not present

## 2020-08-23 DIAGNOSIS — Z1211 Encounter for screening for malignant neoplasm of colon: Secondary | ICD-10-CM | POA: Diagnosis not present

## 2020-08-23 DIAGNOSIS — Z86010 Personal history of colonic polyps: Secondary | ICD-10-CM | POA: Diagnosis not present

## 2020-08-23 DIAGNOSIS — E119 Type 2 diabetes mellitus without complications: Secondary | ICD-10-CM | POA: Diagnosis not present

## 2020-08-23 DIAGNOSIS — Z01818 Encounter for other preprocedural examination: Secondary | ICD-10-CM | POA: Diagnosis not present

## 2020-08-24 ENCOUNTER — Other Ambulatory Visit: Payer: Self-pay | Admitting: Internal Medicine

## 2020-09-01 DIAGNOSIS — K635 Polyp of colon: Secondary | ICD-10-CM | POA: Diagnosis not present

## 2020-09-04 NOTE — Progress Notes (Signed)
Office Visit Note  Patient: Marissa Roberts             Date of Birth: [birth year]           MRN: [id number]             PCP: [name], MD Referring: [name], * Visit Date: 09/18/2020 Occupation: @GUAROCC @  Subjective:  Right hand pain  History of Present Illness: Marissa Roberts is a 75 y.o. female with history of seropositive rheumatoid arthritis and osteoarthritis.  She is on Humira 40 mg sq injections every 14 days and arava 20 mg 1 tablets by mouth daily.  She has not missed any doses recently.  Patient ports that for the past 1 week she has been experiencing increased pain and inflammation in the right wrist.  She states her discomfort was likely exacerbated by performing household activities including doing dishes.  She states the pain is worse when trying to grip things.  She has occasional discomfort and crepitus in the right knee joint.  She uses either voltaren gel or icy hot topically as needed for pain relief.  She has chronic pain in the left knee which is replaced.  She denies any other joint pain or joint swelling at this time.      Activities of Daily Living:  Patient reports morning stiffness for 0 minutes.   Patient Denies nocturnal pain.  Difficulty dressing/grooming: Denies Difficulty climbing stairs: Denies Difficulty getting out of chair: Denies Difficulty using hands for taps, buttons, cutlery, and/or writing: Denies  Review of Systems  Constitutional:  Negative for fatigue.  HENT:  Negative for mouth sores, mouth dryness and nose dryness.   Eyes:  Positive for dryness. Negative for pain and itching.  Respiratory:  Negative for shortness of breath and difficulty breathing.   Cardiovascular:  Negative for chest pain and palpitations.  Gastrointestinal:  Positive for constipation. Negative for blood in stool and diarrhea.  Endocrine: Negative for increased urination.  Genitourinary:  Negative for difficulty urinating.   Musculoskeletal:  Positive for joint pain, joint pain, myalgias, morning stiffness and myalgias. Negative for joint swelling and muscle tenderness.  Skin:  Positive for rash. Negative for color change.  Allergic/Immunologic: Positive for susceptible to infections.  Neurological:  Negative for dizziness, numbness, headaches, memory loss and weakness.  Hematological:  Negative for bruising/bleeding tendency.  Psychiatric/Behavioral:  Negative for confusion.    PMFS History:  Patient Active Problem List   Diagnosis Date Noted   Displaced transverse fracture of left patella, subsequent encounter for closed fracture with delayed healing    Arthritis of knee 05/23/2020   Total knee replacement status 04/20/2020   Pain in knee region after total knee replacement ([place]) 03/22/2020   Rash 10/11/2019   Arthritis of left knee 07/01/2019   Hypercholesterolemia 04/20/2019   B12 deficiency 03/23/2019   Anemia 02/05/2019   Rheumatoid arthritis with rheumatoid factor of multiple sites without organ or systems involvement ([place]) 01/25/2019   High risk medication use 01/25/2019   Fatigue 09/18/2018   Routine general medical examination at a health care facility 02/07/2015   Morbid obesity ([place]) 04/19/2013   Hyperlipidemia associated with type 2 diabetes mellitus ([place]) 07/28/2009   Osteopenia 03/14/2009   Diabetes mellitus type 2 with complications ([place]) 34/19/3790   Hypothyroidism 09/24/2006   Essential hypertension 09/24/2006    Past Medical History:  Diagnosis Date   Anemia    Arthritis    Bronchitis    hx of  Diabetes mellitus    type 2   Family history of adverse reaction to anesthesia    per patient, "daughter threw up after a knee surgery"   GERD (gastroesophageal reflux disease)    Hypercholesteremia    Hypertension    Hypothyroidism    Pneumonia    hx of   PONV (postoperative nausea and vomiting)    Thyroid disease     Family History  Problem Relation Age of Onset   Diabetes  Mother    Heart disease Mother    Hyperlipidemia Mother    Diabetes Father    Diabetes Sister    Hypertension Sister    Heart disease Sister    Dementia Sister    Heart disease Brother    Healthy Daughter    Healthy Daughter    Healthy Daughter    Healthy Son    Past Surgical History:  Procedure Laterality Date   ABDOMINAL HYSTERECTOMY     partial   BREAST SURGERY     lumpectomy left breast   carpel tunnel     COLONOSCOPY  07/2020   COLONOSCOPY W/ POLYPECTOMY     FOOT SURGERY     knee arhtroscopy     KNEE ARTHROPLASTY Left 05/2020   ORIF PATELLA Left 05/23/2020   Procedure: LEFT KNEE POLE PATELLA EXCISION;  Surgeon: [name], MD;  Location: [facility];  Service: Orthopedics;  Laterality: Left;   SHOULDER ARTHROSCOPY WITH SUBACROMIAL DECOMPRESSION, ROTATOR CUFF REPAIR AND BICEP TENDON REPAIR Right 05/26/2012   Procedure: Right Shoulder Diagnostic Operative Arthroscopy, Subacromial Decompression, Biceps Tenodesis, Mini Open Rotator Cuff Repair;  Surgeon: [name], MD;  Location: [facility];  Service: Orthopedics;  Laterality: Right;  Right Shoulder Diagnostic Operative Arthroscopy, Subacromial Decompression, Biceps Tenodesis, Mini Open Rotator Cuff Repair   TONSILLECTOMY     TOTAL KNEE ARTHROPLASTY Left 07/01/2019   Procedure: LEFT TOTAL KNEE ARTHROPLASTY;  Surgeon: [name], MD;  Location: [facility];  Service: Orthopedics;  Laterality: Left;   UPPER GI ENDOSCOPY  07/2020   Social History   Social History Narrative   Not on file   Immunization History  Administered Date(s) Administered   Fluad Quad(high Dose 65+) 01/02/2019, 12/31/2019   Influenza Split 01/03/2011   Influenza Whole 04/01/2005, 12/18/2009   Influenza, High Dose Seasonal PF 01/07/2014, 01/27/2015, 01/02/2016, 01/13/2017, 01/16/2018   Influenza-Unspecified 11/30/2012   PFIZER(Purple Top)SARS-COV-2 Vaccination 05/28/2019, 06/23/2019, 01/15/2020, 07/03/2020   Pneumococcal Conjugate-13  09/12/2016, 01/02/2019   Pneumococcal Polysaccharide-23 07/15/2014, 12/27/2019   Td 04/01/2001, 10/07/2013     Objective: Vital Signs: BP 136/81 (BP Location: Left Arm, Patient Position: Sitting, Cuff Size: Large)   Pulse 75   Resp 16   Ht 5\' 5"  (1.651 m)   Wt 234 lb 3.2 oz (106.2 kg)   BMI 38.97 kg/m    Physical Exam Vitals and nursing note reviewed.  Constitutional:      Appearance: She is well-developed.  HENT:     Head: Normocephalic and atraumatic.  Eyes:     Conjunctiva/sclera: Conjunctivae normal.  Pulmonary:     Effort: Pulmonary effort is normal.  Abdominal:     Palpations: Abdomen is soft.  Musculoskeletal:     Cervical back: Normal range of motion.  Skin:    General: Skin is warm and dry.     Capillary Refill: Capillary refill takes less than 2 seconds.  Neurological:     Mental Status: She is alert and oriented to person, place, and time.  Psychiatric:  Behavior: Behavior normal.     Musculoskeletal Exam: C-spine, thoracic spine, lumbar spine good range of motion with no discomfort.  No midline spinal tenderness or SI joint tenderness.  Shoulder joints have good range of motion with some discomfort and tenderness.  Elbow joints have good range of motion with no tenderness or joint swelling.  Limited extension and tenderness over the right wrist.  Extensor tenosynovitis of the right wrist noted.  No tenderness over MCP or PIP joints.  Complete fist formation bilaterally.  Hip joints have good range of motion with no discomfort.  Left knee replacement has good range of motion with some tenderness on the lateral aspect of the left knee.  Right knee has good range of motion with no warmth or effusion.  Ankle joints have good range of motion with no tenderness or joint swelling.  CDAI Exam: CDAI Score: 4.1  Patient Global: 7 mm; Provider Global: 4 mm Swollen: 1 ; Tender: 2  Joint Exam 09/18/2020      Right  Left  Wrist  Swollen Tender     Knee   Tender         Investigation: No additional findings.  Imaging: No results found.  Recent Labs: Lab Results  Component Value Date   WBC 11.9 (H) 05/16/2020   HGB 12.6 05/16/2020   PLT 235 05/16/2020   NA 141 07/17/2020   K 3.8 07/17/2020   CL 104 07/17/2020   CO2 28 07/17/2020   GLUCOSE 107 (H) 07/17/2020   BUN 12 07/17/2020   CREATININE 0.72 07/17/2020   BILITOT 0.4 07/17/2020   ALKPHOS 45 07/17/2020   AST 17 07/17/2020   ALT 19 07/17/2020   PROT 7.1 07/17/2020   ALBUMIN 4.0 07/17/2020   CALCIUM 10.2 07/17/2020   GFRAA 97 04/07/2020   QFTBGOLDPLUS NEGATIVE 02/21/2020    Speciality Comments: PLQ Eye Exam: 12/22/2019 WNL @ [facility] follow up in 6 months  Methotrexate-hair loss  Procedures:  No procedures performed Allergies: Contrast media [iodinated diagnostic agents], Adhesive [tape], Erythromycin, and Penicillins   Assessment / Plan:     Visit Diagnoses: Rheumatoid arthritis involving multiple sites with positive rheumatoid factor ([place]): She has extensor tenosynovitis of the right wrist.  Painful extension and tenderness was noted over the right wrist on exam today.  Her symptoms started 1 week ago after performing household activities.  She is currently on Humira 40 mg sq injections every 14 days and Arava 20 mg 1 tablet by mouth daily.  She has not missed any doses of these medications recently.  Overall she has been combination therapy to be effective at managing her rheumatoid arthritis.  She does not want to make any medication changes at this time.  Discussed the importance of joint protection and muscle strengthening.  She was encouraged to purchase arthritis compression gloves for comfort and support.  A jar gripper was provided to the patient. A new prescription for Voltaren gel will be sent to the pharmacy today which she can apply topically as needed for pain relief.  She was advised to notify us if she continues to have recurrent flares.  She remain on the current  treatment regimen.  She will follow-up in the office in 5 months.  High risk medication use - Humira 40 mg sq injections every 14 days and Arava 20 mg 1 tablet by mouth daily. (Discontinued MTX due to hair loss, previously on PLQ).  TB gold negative on 02/21/20.  CMP drawn on 07/17/2020 CBC drawn  on 05/16/2020.  She is due to update lab work today.  Orders for CBC and CMP were released.  Her next lab work will be due in September and every 3 months to monitor for drug toxicity.  Standing orders for CBC and CMP remain in place.- Plan: CBC with Differential/Platelet, COMPLETE METABOLIC PANEL WITH GFR She has not had any recent infections.  She was advised to hold Humira and Arava if she develops signs or symptoms of infection and to resume once the infection has completely cleared. She was given a calendar with the dates for her next Humira injections for the rest of 2022.  Primary osteoarthritis of both feet: She is not experiencing any discomfort in her feet at this time.  She is good range of motion of both ankle joints with no tenderness or inflammation.  S/P total knee replacement, left - Performed by Dr. Marlou Sa on 07/01/19.  She continues to experience chronic pain on the lateral aspect of her left knee.  She has good range of motion on examination today with no warmth or effusion.  Osteopenia, unspecified location - DEXA not in epic.  She taking a calcium and vitamin D supplement daily.  Other medical conditions are listed as follows:  Rash: Resolved  History of hypothyroidism  Essential hypertension  Hyperlipidemia associated with type 2 diabetes mellitus ([place])  Diabetes mellitus type 2 with complications ([place])  Orders: Orders Placed This Encounter  Procedures   CBC with Differential/Platelet   COMPLETE METABOLIC PANEL WITH GFR   No orders of the defined types were placed in this encounter.     Follow-Up Instructions: Return in about 5 months (around 02/18/2021) for Rheumatoid  arthritis, Osteoarthritis.   Ofilia Neas, PA-C  Note - This record has been created using Dragon software.  Chart creation errors have been sought, but may not always  have been located. Such creation errors do not reflect on  the standard of medical care.

## 2020-09-18 ENCOUNTER — Other Ambulatory Visit: Payer: Self-pay

## 2020-09-18 ENCOUNTER — Ambulatory Visit (INDEPENDENT_AMBULATORY_CARE_PROVIDER_SITE_OTHER): Payer: Medicare Other | Admitting: Physician Assistant

## 2020-09-18 ENCOUNTER — Encounter: Payer: Self-pay | Admitting: Physician Assistant

## 2020-09-18 VITALS — BP 136/81 | HR 75 | Resp 16 | Ht 65.0 in | Wt 234.2 lb

## 2020-09-18 DIAGNOSIS — Z96652 Presence of left artificial knee joint: Secondary | ICD-10-CM | POA: Diagnosis not present

## 2020-09-18 DIAGNOSIS — M8580 Other specified disorders of bone density and structure, unspecified site: Secondary | ICD-10-CM

## 2020-09-18 DIAGNOSIS — M19071 Primary osteoarthritis, right ankle and foot: Secondary | ICD-10-CM

## 2020-09-18 DIAGNOSIS — Z79899 Other long term (current) drug therapy: Secondary | ICD-10-CM | POA: Diagnosis not present

## 2020-09-18 DIAGNOSIS — I10 Essential (primary) hypertension: Secondary | ICD-10-CM

## 2020-09-18 DIAGNOSIS — M0579 Rheumatoid arthritis with rheumatoid factor of multiple sites without organ or systems involvement: Secondary | ICD-10-CM

## 2020-09-18 DIAGNOSIS — M19072 Primary osteoarthritis, left ankle and foot: Secondary | ICD-10-CM

## 2020-09-18 DIAGNOSIS — E118 Type 2 diabetes mellitus with unspecified complications: Secondary | ICD-10-CM

## 2020-09-18 DIAGNOSIS — E785 Hyperlipidemia, unspecified: Secondary | ICD-10-CM

## 2020-09-18 DIAGNOSIS — R21 Rash and other nonspecific skin eruption: Secondary | ICD-10-CM

## 2020-09-18 DIAGNOSIS — Z8639 Personal history of other endocrine, nutritional and metabolic disease: Secondary | ICD-10-CM

## 2020-09-18 DIAGNOSIS — E1169 Type 2 diabetes mellitus with other specified complication: Secondary | ICD-10-CM

## 2020-09-18 MED ORDER — DICLOFENAC SODIUM 1 % EX GEL: CUTANEOUS | 2 refills | Status: DC

## 2020-09-18 NOTE — Telephone Encounter (Signed)
Pended voltaren gel prescription that patient requested today at her appointment. Please review and send to the pharmacy. Thanks!

## 2020-09-18 NOTE — Patient Instructions (Signed)
Standing Labs We placed an order today for your standing lab work.   Please have your standing labs drawn in September and every 3 months  If possible, please have your labs drawn 2 weeks prior to your appointment so that the provider can discuss your results at your appointment.  Please note that you may see your imaging and lab results in MyChart before we have reviewed them. We may be awaiting multiple results to interpret others before contacting you. Please allow our office up to 72 hours to thoroughly review all of the results before contacting the office for clarification of your results.  We have open lab daily: Monday through Thursday from 1:30-4:30 PM and Friday from 1:30-4:00 PM at the office of Dr. Shaili Deveshwar, [facility].   Please be advised, all patients with office appointments requiring lab work will take precedent over walk-in lab work.  If possible, please come for your lab work on Monday and Friday afternoons, as you may experience shorter wait times. The office is located at [address], [address], [place], [place] [address] No appointment is necessary.   Labs are drawn by [facility]. Please bring your co-pay at the time of your lab draw.  You may receive a bill from [facility] for your lab work.  If you wish to have your labs drawn at another location, please call the office 24 hours in advance to send orders.  If you have any questions regarding directions or hours of operation,  please call [phone].   As a reminder, please drink plenty of water prior to coming for your lab work. Thanks! 

## 2020-09-19 LAB — CBC WITH DIFFERENTIAL/PLATELET
Absolute Monocytes: 766 cells/uL (ref 200–950)
Basophils Absolute: 44 cells/uL (ref 0–200)
Basophils Relative: 0.5 %
Eosinophils Absolute: 226 cells/uL (ref 15–500)
Eosinophils Relative: 2.6 %
HCT: 37.5 % (ref 35.0–45.0)
Hemoglobin: 11.9 g/dL (ref 11.7–15.5)
Lymphs Abs: 3437 cells/uL (ref 850–3900)
MCH: 28.1 pg (ref 27.0–33.0)
MCHC: 31.7 g/dL — ABNORMAL LOW (ref 32.0–36.0)
MCV: 88.4 fL (ref 80.0–100.0)
MPV: 12 fL (ref 7.5–12.5)
Monocytes Relative: 8.8 %
Neutro Abs: 4228 cells/uL (ref 1500–7800)
Neutrophils Relative %: 48.6 %
Platelets: 218 10*3/uL (ref 140–400)
RBC: 4.24 10*6/uL (ref 3.80–5.10)
RDW: 14.1 % (ref 11.0–15.0)
Total Lymphocyte: 39.5 %
WBC: 8.7 10*3/uL (ref 3.8–10.8)

## 2020-09-19 LAB — COMPLETE METABOLIC PANEL WITH GFR
AG Ratio: 1.6 (calc) (ref 1.0–2.5)
ALT: 17 U/L (ref 6–29)
AST: 16 U/L (ref 10–35)
Albumin: 4.2 g/dL (ref 3.6–5.1)
Alkaline phosphatase (APISO): 45 U/L (ref 37–153)
BUN: 11 mg/dL (ref 7–25)
CO2: 30 mmol/L (ref 20–32)
Calcium: 9.9 mg/dL (ref 8.6–10.4)
Chloride: 105 mmol/L (ref 98–110)
Creat: 0.68 mg/dL (ref 0.60–0.93)
GFR, Est African American: 101 mL/min/{1.73_m2} (ref >=60)
GFR, Est Non African American: 87 mL/min/{1.73_m2} (ref >=60)
Globulin: 2.6 g/dL (calc) (ref 1.9–3.7)
Glucose, Bld: 95 mg/dL (ref 65–99)
Potassium: 4 mmol/L (ref 3.5–5.3)
Sodium: 143 mmol/L (ref 135–146)
Total Bilirubin: 0.3 mg/dL (ref 0.2–1.2)
Total Protein: 6.8 g/dL (ref 6.1–8.1)

## 2020-09-19 NOTE — Progress Notes (Signed)
CBC and CMP WNL

## 2020-09-21 ENCOUNTER — Telehealth: Payer: Self-pay | Admitting: *Deleted

## 2020-09-21 NOTE — Telephone Encounter (Signed)
Submitted a Prior Authorization request to [facility] for  Voltaren Gel  via CoverMyMeds. Will update once we receive a response.     

## 2020-09-21 NOTE — Telephone Encounter (Signed)
Received notification from [facility] regarding a prior authorization for  Voltaren Gel . Authorization has been APPROVED from 09/21/2020 to 09/22/2023.

## 2020-09-22 ENCOUNTER — Ambulatory Visit: Payer: Medicare Other | Admitting: Internal Medicine

## 2020-09-25 ENCOUNTER — Ambulatory Visit (INDEPENDENT_AMBULATORY_CARE_PROVIDER_SITE_OTHER): Payer: Medicare Other | Admitting: Internal Medicine

## 2020-09-25 ENCOUNTER — Encounter: Payer: Self-pay | Admitting: Internal Medicine

## 2020-09-25 ENCOUNTER — Other Ambulatory Visit: Payer: Self-pay

## 2020-09-25 DIAGNOSIS — R21 Rash and other nonspecific skin eruption: Secondary | ICD-10-CM | POA: Diagnosis not present

## 2020-09-25 MED ORDER — NYSTATIN 100000 UNIT/GM EX CREA: 1.0000 "application " | TOPICAL_CREAM | Freq: Two times a day (BID) | CUTANEOUS | 0 refills | Status: DC

## 2020-09-25 NOTE — Patient Instructions (Addendum)
We do not need any labs today.  We have sent in nystatin cream to use twice a day on the rash on the left foot.

## 2020-09-25 NOTE — Progress Notes (Signed)
   Subjective:   Patient ID: Marissa Roberts, female    DOB: 18-Dec-1945, 75 y.o.   MRN: [id number]  HPI The patient is a 75 YO female coming in for new rash on the left foot. Itching some. Noticed it weeks ago. Has tried otc which have not helped. Denies injury to the area.   Review of Systems  Constitutional: Negative.   HENT: Negative.    Eyes: Negative.   Respiratory:  Negative for cough, chest tightness and shortness of breath.   Cardiovascular:  Negative for chest pain, palpitations and leg swelling.  Gastrointestinal:  Negative for abdominal distention, abdominal pain, constipation, diarrhea, nausea and vomiting.  Musculoskeletal: Negative.   Skin:  Positive for rash.  Neurological: Negative.   Psychiatric/Behavioral: Negative.     Objective:  Physical Exam Constitutional:      Appearance: She is well-developed. She is obese.  HENT:     Head: Normocephalic and atraumatic.  Cardiovascular:     Rate and Rhythm: Normal rate and regular rhythm.  Pulmonary:     Effort: Pulmonary effort is normal. No respiratory distress.     Breath sounds: Normal breath sounds. No wheezing or rales.  Abdominal:     General: Bowel sounds are normal. There is no distension.     Palpations: Abdomen is soft.     Tenderness: There is no abdominal tenderness. There is no rebound.  Musculoskeletal:     Cervical back: Normal range of motion.  Skin:    General: Skin is warm and dry.     Findings: Rash present.     Comments: Rash consistent with fungal left foot laterally  Neurological:     Mental Status: She is alert and oriented to person, place, and time.     Coordination: Coordination normal.    Vitals:   09/25/20 0913  BP: 130/82  Pulse: 85  Resp: 18  Temp: 98 F (36.7 C)  TempSrc: Oral  SpO2: 96%  Weight: 232 lb 12.8 oz (105.6 kg)  Height: 5\' 5"  (1.651 m)    This visit occurred during the SARS-CoV-2 public health emergency.  Safety protocols were in place, including screening  questions prior to the visit, additional usage of staff PPE, and extensive cleaning of exam room while observing appropriate contact time as indicated for disinfecting solutions.   Assessment & Plan:

## 2020-09-27 ENCOUNTER — Other Ambulatory Visit: Payer: Self-pay

## 2020-09-27 ENCOUNTER — Ambulatory Visit (INDEPENDENT_AMBULATORY_CARE_PROVIDER_SITE_OTHER): Payer: Medicare Other | Admitting: Orthopedic Surgery

## 2020-09-27 DIAGNOSIS — Z96652 Presence of left artificial knee joint: Secondary | ICD-10-CM

## 2020-09-28 DIAGNOSIS — K5904 Chronic idiopathic constipation: Secondary | ICD-10-CM | POA: Diagnosis not present

## 2020-09-28 DIAGNOSIS — R110 Nausea: Secondary | ICD-10-CM | POA: Diagnosis not present

## 2020-09-28 DIAGNOSIS — K2270 Barrett's esophagus without dysplasia: Secondary | ICD-10-CM | POA: Diagnosis not present

## 2020-09-28 DIAGNOSIS — D126 Benign neoplasm of colon, unspecified: Secondary | ICD-10-CM | POA: Diagnosis not present

## 2020-09-28 NOTE — Assessment & Plan Note (Signed)
Fungal rash on the foot. Rx nystatin cream.

## 2020-10-02 ENCOUNTER — Encounter: Payer: Self-pay | Admitting: Orthopedic Surgery

## 2020-10-02 NOTE — Progress Notes (Signed)
Post-Op Visit Note   Patient: Marissa Roberts           Date of Birth: [birth year]           MRN: [id number] Visit Date: 09/27/2020 PCP: [name], MD   Assessment & Plan:  Chief Complaint: No chief complaint on file.  Visit Diagnoses:  1. Status post total left knee replacement     Plan: Patient is now about 2 and half months out from excision inferior pole patella following post total knee arthroplasty avulsion fracture.  Still has lateral pain with standing.  Had left total knee replacement about 1 and half years ago.  Pain did not really go away completely with that procedure.  Topical has not been helpful.  Localizes the pain around the lateral joint line.  She is on Humira.  Denies any fevers or chills and denies any low back pain.  On examination she has full range of motion well-healed surgical incision no effusion no warmth to the left knee.  No real tenderness of the iliotibial band over the lateral epicondyle.  Collateral ligaments are stable to varus valgus stress at 0 and 30 degrees.  No popping or clicking with range of motion both loaded and unloaded.  Plan at this time is that she has some lateral pain which is not really well explained.  60-month return with blood work at that time and repeat radiographs.  Follow-Up Instructions: Return in about 4 months (around 01/27/2021).   Orders:  No orders of the defined types were placed in this encounter.  No orders of the defined types were placed in this encounter.   Imaging: No results found.  PMFS History: Patient Active Problem List   Diagnosis Date Noted   Displaced transverse fracture of left patella, subsequent encounter for closed fracture with delayed healing    Arthritis of knee 05/23/2020   Total knee replacement status 04/20/2020   Pain in knee region after total knee replacement ([place]) 03/22/2020   Rash 10/11/2019   Arthritis of left knee 07/01/2019   Hypercholesterolemia 04/20/2019   B12  deficiency 03/23/2019   Anemia 02/05/2019   Rheumatoid arthritis with rheumatoid factor of multiple sites without organ or systems involvement ([place]) 01/25/2019   High risk medication use 01/25/2019   Fatigue 09/18/2018   Routine general medical examination at a health care facility 02/07/2015   Morbid obesity ([place]) 04/19/2013   Hyperlipidemia associated with type 2 diabetes mellitus ([place]) 07/28/2009   Osteopenia 03/14/2009   Diabetes mellitus type 2 with complications ([place]) 53/97/6734   Hypothyroidism 09/24/2006   Essential hypertension 09/24/2006   Past Medical History:  Diagnosis Date   Anemia    Arthritis    Bronchitis    hx of   Diabetes mellitus    type 2   Family history of adverse reaction to anesthesia    per patient, "daughter threw up after a knee surgery"   GERD (gastroesophageal reflux disease)    Hypercholesteremia    Hypertension    Hypothyroidism    Pneumonia    hx of   PONV (postoperative nausea and vomiting)    Thyroid disease     Family History  Problem Relation Age of Onset   Diabetes Mother    Heart disease Mother    Hyperlipidemia Mother    Diabetes Father    Diabetes Sister    Hypertension Sister    Heart disease Sister    Dementia Sister    Heart disease Brother  Healthy Daughter    Healthy Daughter    Healthy Daughter    Healthy Son     Past Surgical History:  Procedure Laterality Date   ABDOMINAL HYSTERECTOMY     partial   BREAST SURGERY     lumpectomy left breast   carpel tunnel     COLONOSCOPY  07/2020   COLONOSCOPY W/ POLYPECTOMY     FOOT SURGERY     knee arhtroscopy     KNEE ARTHROPLASTY Left 05/2020   ORIF PATELLA Left 05/23/2020   Procedure: LEFT KNEE POLE PATELLA EXCISION;  Surgeon: [name], MD;  Location: [facility];  Service: Orthopedics;  Laterality: Left;   SHOULDER ARTHROSCOPY WITH SUBACROMIAL DECOMPRESSION, ROTATOR CUFF REPAIR AND BICEP TENDON REPAIR Right 05/26/2012   Procedure: Right Shoulder Diagnostic  Operative Arthroscopy, Subacromial Decompression, Biceps Tenodesis, Mini Open Rotator Cuff Repair;  Surgeon: [name], MD;  Location: [facility];  Service: Orthopedics;  Laterality: Right;  Right Shoulder Diagnostic Operative Arthroscopy, Subacromial Decompression, Biceps Tenodesis, Mini Open Rotator Cuff Repair   TONSILLECTOMY     TOTAL KNEE ARTHROPLASTY Left 07/01/2019   Procedure: LEFT TOTAL KNEE ARTHROPLASTY;  Surgeon: [name], MD;  Location: [facility];  Service: Orthopedics;  Laterality: Left;   UPPER GI ENDOSCOPY  07/2020   Social History   Occupational History   Not on file  Tobacco Use   Smoking status: Former    Pack years: 0.00    Types: Cigarettes    Quit date: 04/01/1990    Years since quitting: 30.5   Smokeless tobacco: Never  Vaping Use   Vaping Use: Never used  Substance and Sexual Activity   Alcohol use: Not Currently    Comment: 06/22/2019: per patient about 20-25 years ago   Drug use: No   Sexual activity: Never    Birth control/protection: Abstinence

## 2020-10-04 ENCOUNTER — Other Ambulatory Visit: Payer: Self-pay | Admitting: Endocrinology

## 2020-10-04 ENCOUNTER — Telehealth: Payer: Self-pay | Admitting: Endocrinology

## 2020-10-04 DIAGNOSIS — E063 Autoimmune thyroiditis: Secondary | ICD-10-CM

## 2020-10-04 MED ORDER — EUTHYROX 112 MCG PO TABS: 112.0000 ug | ORAL_TABLET | Freq: Every day | ORAL | 1 refills | Status: DC

## 2020-10-04 NOTE — Telephone Encounter (Signed)
Pt states Dr Dwyane Dee was gonna change the strength of her Levothyroxine next time she needed a refill so she was calling to get that refill.  MEDICATION: Levothyroxine  PHARMACY:   [facility], [place] Phone:  [phone]  Fax:  [phone]      HAS THE PATIENT CONTACTED THEIR PHARMACY?  no  IS THIS A 90 DAY SUPPLY : yes  IS PATIENT OUT OF MEDICATION: yes  IF NOT; HOW MUCH IS LEFT:   LAST APPOINTMENT DATE: @5 /11/2020  NEXT APPOINTMENT DATE:@7 /26/2022  DO WE HAVE YOUR PERMISSION TO LEAVE A DETAILED MESSAGE?: yes   **Let patient know to contact pharmacy at the end of the day to make sure medication is ready. **  ** Please notify patient to allow 48-72 hours to process**  **Encourage patient to contact the pharmacy for refills or they can request refills through [facility]**

## 2020-10-05 NOTE — Telephone Encounter (Signed)
Patient called again re: Levothyroxine. Patient states her PHARM told her the new Thyroid medication is $28.00. Patient states that is not affordable and requests a new RX for the following:  MEDICATION: Levothyroxine   PHARMACY:   [place], [place] [facility] [place] Phone: [phone]  Fax: [phone]        HAS THE PATIENT CONTACTED [facility]?  no   IS THIS A 90 DAY SUPPLY : yes   IS PATIENT OUT OF MEDICATION: yes   IF NOT; HOW MUCH IS LEFT:   LAST APPOINTMENT DATE: @5 /11/2020   NEXT APPOINTMENT DATE:@7 /26/2022   DO WE HAVE YOUR PERMISSION TO LEAVE A DETAILED MESSAGE?: yes

## 2020-10-05 NOTE — Telephone Encounter (Signed)
Pt can't afford medication , please advise

## 2020-10-05 NOTE — Telephone Encounter (Signed)
Called and left detail vm to inform patient this has been change to the generic for her so she can afford medication. Ask her call us back if she still not able to afford  medication.

## 2020-10-09 ENCOUNTER — Telehealth: Payer: Self-pay | Admitting: Endocrinology

## 2020-10-09 ENCOUNTER — Other Ambulatory Visit: Payer: Self-pay | Admitting: Physician Assistant

## 2020-10-09 MED ORDER — LEVOTHYROXINE SODIUM 112 MCG PO TABS: 112.0000 ug | ORAL_TABLET | Freq: Every day | ORAL | 1 refills | Status: DC

## 2020-10-09 NOTE — Telephone Encounter (Signed)
Patient called again re: Levothyroxine Patient called to advise that the Levothyroxine was not sent to [facility] for her  . MEDICATION: Levothyroxine   PHARMACY:   [place], [place] Phone: [phone]  Fax: [phone]        HAS THE PATIENT CONTACTED THEIR PHARMACY?  no   IS THIS A 90 DAY SUPPLY : yes   IS PATIENT OUT OF MEDICATION: yes   IF NOT; HOW MUCH IS LEFT:   LAST APPOINTMENT DATE: @5 /11/2020   NEXT APPOINTMENT DATE:@7 /26/2022   DO WE HAVE YOUR PERMISSION TO LEAVE A DETAILED MESSAGE?: yes

## 2020-10-09 NOTE — Addendum Note (Signed)
Addended by: Lauralyn Primes on: 10/09/2020 12:36 PM   Modules accepted: Orders

## 2020-10-09 NOTE — Telephone Encounter (Signed)
Rx sent to preferred pharmacy.

## 2020-10-09 NOTE — Telephone Encounter (Signed)
error 

## 2020-10-09 NOTE — Telephone Encounter (Signed)
Next Visit: 02/26/2021  Last Visit: 09/18/2020  Last Fill: 07/03/2020  DX: Rheumatoid arthritis involving multiple sites with positive rheumatoid factor  Current Dose per office note 09/18/2020: Arava 20 mg 1 tablet by mouth daily  Labs: 09/18/2020 CBC and CMP WNL  Per protocol, okay to refill per Dr. Estanislado Pandy

## 2020-10-11 ENCOUNTER — Other Ambulatory Visit: Payer: Self-pay

## 2020-10-11 ENCOUNTER — Encounter: Payer: Self-pay | Admitting: Podiatry

## 2020-10-11 ENCOUNTER — Ambulatory Visit: Payer: Medicare Other | Admitting: Podiatry

## 2020-10-11 DIAGNOSIS — B353 Tinea pedis: Secondary | ICD-10-CM | POA: Diagnosis not present

## 2020-10-11 DIAGNOSIS — E119 Type 2 diabetes mellitus without complications: Secondary | ICD-10-CM

## 2020-10-11 DIAGNOSIS — Z794 Long term (current) use of insulin: Secondary | ICD-10-CM

## 2020-10-11 DIAGNOSIS — M79675 Pain in left toe(s): Secondary | ICD-10-CM | POA: Diagnosis not present

## 2020-10-11 DIAGNOSIS — B351 Tinea unguium: Secondary | ICD-10-CM

## 2020-10-11 DIAGNOSIS — L84 Corns and callosities: Secondary | ICD-10-CM | POA: Diagnosis not present

## 2020-10-11 DIAGNOSIS — M79674 Pain in right toe(s): Secondary | ICD-10-CM

## 2020-10-11 MED ORDER — CLOTRIMAZOLE 1 % EX CREA: TOPICAL_CREAM | CUTANEOUS | 1 refills | Status: DC

## 2020-10-11 NOTE — Patient Instructions (Signed)
Discontinue Nystatin Cream. Start Clotrimazole Cream 1% twice daily for 6 weeks.

## 2020-10-14 NOTE — Progress Notes (Signed)
Subjective:  Patient ID: Marissa Roberts, female    DOB: 1946/02/06,  MRN: [id number]  75 y.o. female presents with preventative diabetic foot care and corn(s) b/l feet and painful thick toenails that are difficult to trim. Painful toenails interfere with ambulation. Aggravating factors include wearing enclosed shoe gear. Pain is relieved with periodic professional debridement. Painful corns are aggravated when weightbearing when wearing enclosed shoe gear. Pain is relieved with periodic professional debridement..    Patient's blood sugar was  122  mg/dl today.  Patient states she has foot itching and rash of left foot. She was given Rx for Nystatin and it doesn't seem to be relieving her symptoms.   PCP: [name], MD and last visit was: 09/25/2020  Review of Systems: Negative except as noted in the HPI.   Allergies  Allergen Reactions   Contrast Media [Iodinated Diagnostic Agents]     Throat swells from ct contra   Adhesive [Tape]     No paper tape, causes blisters   Erythromycin Rash   Penicillins Rash    Did it involve swelling of the face/tongue/throat, SOB, or low BP? No Did it involve sudden or severe rash/hives, skin peeling, or any reaction on the inside of your mouth or nose? No Did you need to seek medical attention at a hospital or doctor's office? No When did it last happen?    Many years If all above answers are "NO", may proceed with cephalosporin use.     Objective:  There were no vitals filed for this visit. Constitutional Patient is a pleasant 75 y.o. African American female obese in NAD. AAO x 3.  Vascular Capillary fill time to digits <3 seconds b/l lower extremities. Palpable pedal pulses b/l LE. Pedal hair sparse. Lower extremity skin temperature gradient within normal limits. No pain with calf compression b/l. No cyanosis or clubbing noted.  Neurologic Normal speech. Protective sensation intact 5/5 intact bilaterally with 10g monofilament b/l.   Dermatologic No open wounds b/l lower extremities. No interdigital macerations b/l lower extremities. Toenails 1-5 b/l elongated, discolored, dystrophic, thickened, crumbly with subungual debris and tenderness to dorsal palpation. Hyperkeratotic lesion(s) L hallux, L 5th toe, R hallux, and R 5th toe.  No erythema, no edema, no drainage, no fluctuance. Diffuse scaling noted peripherally and plantarly b/l feet with mild foot odor.  No interdigital macerations.  No blisters, no weeping. No signs of secondary bacterial infection noted.  Orthopedic: Normal muscle strength 5/5 to all lower extremity muscle groups bilaterally. No pain crepitus or joint limitation noted with ROM b/l. Hammertoe(s) noted to the L 5th toe and R 5th toe.   Hemoglobin A1C Latest Ref Rng & Units 07/17/2020 05/16/2020 02/29/2020 11/25/2019  HGBA1C 4.6 - 6.5 % 6.4 5.7(H) 5.7 5.7  Some recent data might be hidden   Assessment:   1. Pain due to onychomycosis of toenails of both feet   2. Corns   3. Tinea pedis of left foot   4. Type 2 diabetes mellitus without complication, with long-term current use of insulin ([place])    Plan:  Patient was evaluated and treated and all questions answered.  Onychomycosis with pain -Nails palliatively debridement as below. -Educated on self-care  Procedure: Nail Debridement Rationale: Pain Type of Debridement: manual, sharp debridement. Instrumentation: Nail nipper, rotary burr. Number of Nails: 10  -Examined patient. -Patient to continue soft, supportive shoe gear daily. -Toenails 1-5 b/l were debrided in length and girth with sterile nail nippers and dremel without iatrogenic bleeding.  -  Patient to report any pedal injuries to medical professional immediately. -Patient feels Nystatin Cream isn't working. Discontinue Nystatin Cream. Start Clotrimazole Cream 1%. Apply to foot and between toes bid x 6 weeks. Call office if symptoms do not improve.. -Patient/POA to call should there be  question/concern in the interim.  Return in about 3 months (around 01/11/2021).  Marzetta Board, DPM

## 2020-10-18 ENCOUNTER — Other Ambulatory Visit: Payer: Self-pay | Admitting: Endocrinology

## 2020-10-18 ENCOUNTER — Telehealth: Payer: Self-pay | Admitting: Endocrinology

## 2020-10-18 NOTE — Telephone Encounter (Signed)
MEDICATION: valsartan  PHARMACY:   [place], [place] Phone:  [phone]  Fax:  [phone]      HAS THE PATIENT CONTACTED THEIR PHARMACY?  Yes, but told her no more refills  IS THIS A 90 DAY SUPPLY : yes  IS PATIENT OUT OF MEDICATION: yes, finished last one this morning  IF NOT; HOW MUCH IS LEFT:   LAST APPOINTMENT DATE: @7 /01/2021  NEXT APPOINTMENT DATE:@7 /26/2022  DO WE HAVE YOUR PERMISSION TO LEAVE A DETAILED MESSAGE?: yes   **Let patient know to contact pharmacy at the end of the day to make sure medication is ready. **  ** Please notify patient to allow 48-72 hours to process**  **Encourage patient to contact the pharmacy for refills or they can request refills through [facility]**

## 2020-10-24 ENCOUNTER — Other Ambulatory Visit: Payer: Self-pay

## 2020-10-24 ENCOUNTER — Other Ambulatory Visit (INDEPENDENT_AMBULATORY_CARE_PROVIDER_SITE_OTHER): Payer: Medicare Other

## 2020-10-24 DIAGNOSIS — E785 Hyperlipidemia, unspecified: Secondary | ICD-10-CM | POA: Diagnosis not present

## 2020-10-24 DIAGNOSIS — Z794 Long term (current) use of insulin: Secondary | ICD-10-CM | POA: Diagnosis not present

## 2020-10-24 DIAGNOSIS — E1169 Type 2 diabetes mellitus with other specified complication: Secondary | ICD-10-CM

## 2020-10-24 DIAGNOSIS — E1165 Type 2 diabetes mellitus with hyperglycemia: Secondary | ICD-10-CM | POA: Diagnosis not present

## 2020-10-24 LAB — BASIC METABOLIC PANEL
BUN: 14 mg/dL (ref 6–23)
CO2: 29 mEq/L (ref 19–32)
Calcium: 10.2 mg/dL (ref 8.4–10.5)
Chloride: 102 mEq/L (ref 96–112)
Creatinine, Ser: 0.83 mg/dL (ref 0.40–1.20)
GFR: 70.06 mL/min (ref >60.00)
Glucose, Bld: 114 mg/dL — ABNORMAL HIGH (ref 70–99)
Potassium: 3.8 mEq/L (ref 3.5–5.1)
Sodium: 140 mEq/L (ref 135–145)

## 2020-10-24 LAB — LDL CHOLESTEROL, DIRECT: Direct LDL: 70 mg/dL

## 2020-10-24 LAB — HEMOGLOBIN A1C: Hgb A1c MFr Bld: 6.5 % (ref 4.6–6.5)

## 2020-10-26 ENCOUNTER — Other Ambulatory Visit: Payer: Self-pay | Admitting: Surgical

## 2020-10-27 ENCOUNTER — Ambulatory Visit: Payer: Medicare Other | Admitting: Endocrinology

## 2020-10-31 ENCOUNTER — Other Ambulatory Visit: Payer: Self-pay

## 2020-10-31 ENCOUNTER — Encounter: Payer: Self-pay | Admitting: Endocrinology

## 2020-10-31 ENCOUNTER — Ambulatory Visit (INDEPENDENT_AMBULATORY_CARE_PROVIDER_SITE_OTHER): Payer: Medicare Other | Admitting: Endocrinology

## 2020-10-31 VITALS — BP 130/72 | HR 81 | Ht 65.0 in | Wt 234.8 lb

## 2020-10-31 DIAGNOSIS — Z794 Long term (current) use of insulin: Secondary | ICD-10-CM

## 2020-10-31 DIAGNOSIS — E063 Autoimmune thyroiditis: Secondary | ICD-10-CM | POA: Diagnosis not present

## 2020-10-31 DIAGNOSIS — I10 Essential (primary) hypertension: Secondary | ICD-10-CM | POA: Diagnosis not present

## 2020-10-31 DIAGNOSIS — E1165 Type 2 diabetes mellitus with hyperglycemia: Secondary | ICD-10-CM | POA: Diagnosis not present

## 2020-10-31 DIAGNOSIS — E1129 Type 2 diabetes mellitus with other diabetic kidney complication: Secondary | ICD-10-CM

## 2020-10-31 DIAGNOSIS — R809 Proteinuria, unspecified: Secondary | ICD-10-CM

## 2020-10-31 NOTE — Progress Notes (Signed)
Patient ID: Marissa Roberts, female   DOB: 13-Jan-1946, 75 y.o.   MRN: [id number]               Reason for Appointment:  Follow-up for diabetes and hypertension   History of Present Illness:             Type 2 diabetes mellitus, date of diagnosis: 2005       INSULIN regimen is  Tresiba 34 units am, Novolog 10-16 units bid- tid ac  Oral hypoglycemic drugs the patient is taking are: Metformin 2 g daily, Farxiga 5 mg daily   Current blood sugar patterns, management and problems identified:  Her A1c is about the same at 6.5   She has been on Farxiga 5 mg since 05/2019 with consistent improvement in her control She recently has not needed to reduce her insulin doses further However she will occasionally have low blood sugar from overestimating her insulin needs at dinnertime  FASTING blood sugars are excellent and stable with 34 units of [place] which she will takes regularly every morning  Also trying to take NovoLog at mealtimes consistently  May have missed at least 1 dose causing a higher reading  With not monitoring her blood sugars consistently after meals difficult to assess her postprandial readings  She is still drinking some sweet tea although this is made with less sugar Recently not able to lose any weight Because of musculoskeletal reasons she is not able to do much exercise     Dinner usually at 6-7pm  Side effects from medications have been:none  Compliance with the medical regimen: Fair  Glucose monitoring:  done 1 time a day         Glucometer:  One Touch.       Blood Glucose data from review of monitor download:   PRE-MEAL Fasting Lunch Dinner Bedtime Overall  Glucose range: 92-148  111-248  51-248  Mean/median: 111  142  115   POST-MEAL PC Breakfast PC Lunch PC Dinner  Glucose range:   51-134  Mean/median:   85   Prior  PRE-MEAL Fasting Lunch Dinner Bedtime Overall  Glucose range: 88-148      Mean/median:  114    116   POST-MEAL PC Breakfast PC  Lunch PC Dinner  Glucose range:   156  142  Mean/median:        Self-care:   Meals: 3 meals per day. Breakfast is usually grits with or without eggs or meat Not always restricting fat intake or sweet tea  when eating out        Exercise:  Minimal, she has knee pain  Dietician visit, most recent:2014.               Weight history:  Wt Readings from Last 3 Encounters:  10/31/20 234 lb 12.8 oz (106.5 kg)  09/25/20 232 lb 12.8 oz (105.6 kg)  09/18/20 234 lb 3.2 oz (106.2 kg)    Glycemic control:   Lab Results  Component Value Date   HGBA1C 6.5 10/24/2020   HGBA1C 6.4 07/17/2020   HGBA1C 5.7 (H) 05/16/2020   Lab Results  Component Value Date   MICROALBUR 10.2 (H) 07/17/2020   LDLCALC 61 08/24/2019   CREATININE 0.83 10/24/2020   No results found for: FRUCTOSAMINE    Past history:  She thinks her blood sugars were only mildly increased at borderline levels at the onsetper Most likely she was treated with metformin initially and this has been continued.  At some point she was also tried on [place] and [place] for improving her control. She is not sure why she was started on insulin 5 years ago, presumably for worsening hyperglycemia Also not clear if she has tried various insulin regimens before starting Lantus and NovoLog  Previously had tried the V-go pump but subsequently she stopped doing the V-go pump because of the cost, skin irritation and local discomfort   Allergies as of 10/31/2020       Reactions   Contrast Media [iodinated Diagnostic Agents]    Throat swells from ct contra   Adhesive [tape]    No paper tape, causes blisters   Erythromycin Rash   Penicillins Rash   Did it involve swelling of the face/tongue/throat, SOB, or low BP? No Did it involve sudden or severe rash/hives, skin peeling, or any reaction on the inside of your mouth or nose? No Did you need to seek medical attention at a hospital or doctor's office? No When did it last happen?     Many years If all above answers are "NO", may proceed with cephalosporin use.        Medication List        Accurate as of October 31, 2020  2:23 PM. If you have any questions, ask your nurse or doctor.          amLODipine 10 MG tablet Commonly known as: NORVASC Take 1 tablet (10 mg total) by mouth daily.   BD Pen Needle Nano U/F 32G X 4 MM Misc Generic drug: Insulin Pen Needle USE  4 TIMES DAILY TO  INJECT  INSULIN   Biotin 10000 MCG Tabs Take 10,000 mcg by mouth daily.   CALCIUM 600 + D PO Take 2 tablets by mouth daily.   cephALEXin 500 MG capsule Commonly known as: KEFLEX TAKE 4 CAPSULES (2,000 MG) 30-60 MINUTES PRIOR TO DENTAL PROCEDURE   clotrimazole 1 % cream Commonly known as: LOTRIMIN Apply to both feet and between toes twice daily for 6 weeks.   dapagliflozin propanediol 5 MG Tabs tablet Commonly known as: Farxiga Take 1 tablet (5 mg total) by mouth daily.   diclofenac Sodium 1 % Gel Commonly known as: VOLTAREN Apply 2-4 grams to affected joint 4 times daily as needed.   fluticasone 50 MCG/ACT nasal spray Commonly known as: FLONASE Place 2 sprays into both nostrils 2 (two) times daily as needed for allergies or rhinitis.   furosemide 40 MG tablet Commonly known as: LASIX Take 2 tablets by mouth once daily What changed: how much to take   GLUCOSAMINE-MSM PO Take 1 tablet by mouth daily.   Humira Pen 40 MG/0.4ML Pnkt Generic drug: Adalimumab INJECT 1 PEN UNDER THE SKIN EVERY 14 DAYS.   latanoprost 0.005 % ophthalmic solution Commonly known as: XALATAN SMARTSIG:1 Drop(s) In Eye(s) Every Evening   leflunomide 20 MG tablet Commonly known as: ARAVA Take 1 tablet by mouth once daily   levothyroxine 112 MCG tablet Commonly known as: Synthroid Take 1 tablet (112 mcg total) by mouth daily before breakfast.   linaclotide 290 MCG Caps capsule Commonly known as: LINZESS Take 1 capsule (290 mcg total) by mouth daily as needed. What changed:  reasons to take this   metFORMIN 1000 MG tablet Commonly known as: GLUCOPHAGE TAKE 1 TABLET BY MOUTH TWICE DAILY WITH A MEAL   methocarbamol 500 MG tablet Commonly known as: Robaxin Take 1 tablet (500 mg total) by mouth every 8 (eight) hours as needed for muscle spasms.  montelukast 10 MG tablet Commonly known as: SINGULAIR TAKE 1 TABLET BY MOUTH AT BEDTIME   multivitamin with minerals Tabs tablet Take 1 tablet by mouth daily.   NovoLOG FlexPen 100 UNIT/ML FlexPen Generic drug: insulin aspart INJECT 10-15 UNITS SUBCUTANEOUSLY THREE TIMES DAILY BEFORE MEAL(S)   omeprazole 40 MG capsule Commonly known as: PRILOSEC Take 40 mg by mouth daily.   OneTouch Delica Plus 0000000 Misc USE TO CHECK BLOOD SUGAR UP TO THREE TIMES A DAY 2 HOURS AFTER A MEAL AND UP TO 3 TIMES A WEEK UPON WAKING UP   OneTouch Verio test strip Generic drug: glucose blood CHECK BLOOD SUGAR 4 TIMES DAILY   potassium chloride 10 MEQ tablet Commonly known as: KLOR-CON Take 1 tablet (10 mEq total) by mouth daily.   rosuvastatin 40 MG tablet Commonly known as: CRESTOR Take 1 tablet by mouth once daily   traMADol 50 MG tablet Commonly known as: Ultram Take 1 tablet (50 mg total) by mouth daily as needed.   Tyler Aas FlexTouch 200 UNIT/ML FlexTouch Pen Generic drug: insulin degludec INJECT 36 UNITS SUBCUTANEOUSLY IN THE MORNING What changed: See the new instructions.   valsartan-hydrochlorothiazide 320-12.5 MG tablet Commonly known as: DIOVAN-HCT Take 1 tablet by mouth once daily        Allergies:  Allergies  Allergen Reactions   Contrast Media [Iodinated Diagnostic Agents]     Throat swells from ct contra   Adhesive [Tape]     No paper tape, causes blisters   Erythromycin Rash   Penicillins Rash    Did it involve swelling of the face/tongue/throat, SOB, or low BP? No Did it involve sudden or severe rash/hives, skin peeling, or any reaction on the inside of your mouth or nose? No Did you  need to seek medical attention at a hospital or doctor's office? No When did it last happen?    Many years If all above answers are "NO", may proceed with cephalosporin use.     Past Medical History:  Diagnosis Date   Anemia    Arthritis    Bronchitis    hx of   Diabetes mellitus    type 2   Family history of adverse reaction to anesthesia    per patient, "daughter threw up after a knee surgery"   GERD (gastroesophageal reflux disease)    Hypercholesteremia    Hypertension    Hypothyroidism    Pneumonia    hx of   PONV (postoperative nausea and vomiting)    Thyroid disease     Past Surgical History:  Procedure Laterality Date   ABDOMINAL HYSTERECTOMY     partial   BREAST SURGERY     lumpectomy left breast   carpel tunnel     COLONOSCOPY  07/2020   COLONOSCOPY W/ POLYPECTOMY     FOOT SURGERY     knee arhtroscopy     KNEE ARTHROPLASTY Left 05/2020   ORIF PATELLA Left 05/23/2020   Procedure: LEFT KNEE POLE PATELLA EXCISION;  Surgeon: [name], MD;  Location: [facility];  Service: Orthopedics;  Laterality: Left;   SHOULDER ARTHROSCOPY WITH SUBACROMIAL DECOMPRESSION, ROTATOR CUFF REPAIR AND BICEP TENDON REPAIR Right 05/26/2012   Procedure: Right Shoulder Diagnostic Operative Arthroscopy, Subacromial Decompression, Biceps Tenodesis, Mini Open Rotator Cuff Repair;  Surgeon: [name], MD;  Location: [facility];  Service: Orthopedics;  Laterality: Right;  Right Shoulder Diagnostic Operative Arthroscopy, Subacromial Decompression, Biceps Tenodesis, Mini Open Rotator Cuff Repair   TONSILLECTOMY     TOTAL KNEE  ARTHROPLASTY Left 07/01/2019   Procedure: LEFT TOTAL KNEE ARTHROPLASTY;  Surgeon: [name], MD;  Location: [facility];  Service: Orthopedics;  Laterality: Left;   UPPER GI ENDOSCOPY  07/2020    Family History  Problem Relation Age of Onset   Diabetes Mother    Heart disease Mother    Hyperlipidemia Mother    Diabetes Father    Diabetes Sister     Hypertension Sister    Heart disease Sister    Dementia Sister    Heart disease Brother    Healthy Daughter    Healthy Daughter    Healthy Daughter    Healthy Son     Social History:  reports that she quit smoking about 30 years ago. Her smoking use included cigarettes. She has never used smokeless tobacco. She reports previous alcohol use. She reports that she does not use drugs.    Review of Systems   HYPERTENSION: She is on amlodipine 5 mg once daily and Diovan HCT 320/12.5  Also has microalbuminuria and because of this as well as tendency to edema she was tried on diltiazem but she felt that this was causing hair loss and constipation    She is checking blood pressure at home, no recent high readings  BP Readings from Last 3 Encounters:  10/31/20 130/72  09/25/20 130/82  09/18/20 136/81    On Lasix 40 mg for edema     LIPIDS:  She is taking rosuvastatin 40 mg, previously LDL was high with simvastatin 20 mg LDL below 100 Crestor prescribed by PCP      Lab Results  Component Value Date   CHOL 133 08/24/2019   HDL 59.70 08/24/2019   LDLCALC 61 08/24/2019   LDLDIRECT 70.0 10/24/2020   TRIG 64.0 08/24/2019   CHOLHDL 2 08/24/2019                  Thyroid:      She has been hypothyroid for over 40 years Previously taking a supplement of 137 g levothyroxine, 6 tablets per week and more recently is just switching to 112 mcg daily She takes levothyroxine before breakfast consistently  Does not feel any different with changing her dose in the last visit and she tends to have tiredness chronically  TSH has been always below was low normal on the last visit  Last labs:   Lab Results  Component Value Date   TSH 0.32 (L) 07/17/2020   TSH 2.03 02/29/2020   TSH 1.75 08/24/2019   FREET4 1.58 08/24/2019   FREET4 1.11 07/21/2018   FREET4 1.05 10/10/2017     Physical Examination:  BP 130/72   Pulse 81   Ht '5\' 5"'$  (1.651 m)   Wt 234 lb 12.8 oz (106.5 kg)    SpO2 95%   BMI 39.07 kg/m   No ankle edema present    ASSESSMENT/PLAN:  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:  See history of present illness for detailed discussion of current diabetes management, blood sugar patterns and problems identified  She is on basal bolus insulin, Farxiga 5 mg and metformin  A1c is 6.5  She has some variability in her blood sugars in the evenings but no consistent pattern Has occasionally missed her mealtime doses and blood sugars are usually related to overestimating how much she is eating Renal function and blood pressure stable with 5 mg Farxiga  Recommendations:  Continue same doses of insulin To help assess her readings better she will be prescribed the Dexcom  sensor, currently she was told freestyle Elenor [place] is not covered She will let us know when this is approved Meanwhile try to check more readings after meals She needs to cut down her insulin 2 to 4 units for her NovoLog dose when eating smaller meals or less carbohydrate Continue same dose of [place]  HYPERTENSION with microalbuminuria: Blood pressure is relatively better now Will need follow-up microalbumin on the next visit  HYPOTHYROIDISM: She is just starting 112 mcg and will need to recheck her TSH on the next visit  Needs follow-up lipids from her PCP  There are no Patient Instructions on file for this visit.   Elayne Snare 10/31/2020, 2:23 PM   Note: This office note was prepared with Dragon voice recognition system technology. Any transcriptional errors that result from this process are unintentional.

## 2020-10-31 NOTE — Patient Instructions (Signed)
Check blood sugars on waking up 3-4 days a week  Also check blood sugars about 2 hours after meals and do this after different meals by rotation  Recommended blood sugar levels on waking up are 90-130 and about 2 hours after meal is 130-180  Please bring your blood sugar monitor to each visit, thank you

## 2020-11-01 MED ORDER — DEXCOM G6 TRANSMITTER MISC: 1.0000 | Freq: Once | 1 refills | Status: DC

## 2020-11-01 MED ORDER — DEXCOM G6 SENSOR MISC: 3 refills | Status: DC

## 2020-11-01 MED ORDER — DEXCOM G6 RECEIVER DEVI: 0 refills | Status: DC

## 2020-11-02 ENCOUNTER — Other Ambulatory Visit: Payer: Self-pay | Admitting: Internal Medicine

## 2020-11-06 ENCOUNTER — Other Ambulatory Visit: Payer: Self-pay | Admitting: Physician Assistant

## 2020-11-06 DIAGNOSIS — M0579 Rheumatoid arthritis with rheumatoid factor of multiple sites without organ or systems involvement: Secondary | ICD-10-CM

## 2020-11-06 NOTE — Telephone Encounter (Signed)
Next Visit: 02/26/2021   Last Visit: 09/18/2020   Last Fill: 05/18/2020   DX: Rheumatoid arthritis involving multiple sites with positive rheumatoid factor   Current Dose per office note 09/18/2020: Humira 40 mg sq injections every 14 days  Labs: 09/18/2020 CBC and CMP WNL  TB Gold: 02/21/2020 Neg   Okay to refill Humira?

## 2020-11-07 ENCOUNTER — Other Ambulatory Visit: Payer: Self-pay | Admitting: Endocrinology

## 2020-11-08 ENCOUNTER — Telehealth: Payer: Self-pay | Admitting: Pharmacy Technician

## 2020-11-08 NOTE — Telephone Encounter (Signed)
Patient Advocate Encounter   Received notification from [place] that prior authorization for [facility] G6 SENSOR is required.   PA submitted on 11/08/2020 Key [id number] Status is [facility] will continue to follow   Ronney Asters, CPhT Patient Advocate [facility] Phone: [phone] Fax:  [phone]

## 2020-11-15 ENCOUNTER — Ambulatory Visit: Payer: Self-pay

## 2020-11-15 ENCOUNTER — Other Ambulatory Visit: Payer: Self-pay

## 2020-11-15 ENCOUNTER — Ambulatory Visit (INDEPENDENT_AMBULATORY_CARE_PROVIDER_SITE_OTHER): Payer: Medicare Other | Admitting: Orthopedic Surgery

## 2020-11-15 DIAGNOSIS — M25562 Pain in left knee: Secondary | ICD-10-CM | POA: Diagnosis not present

## 2020-11-16 ENCOUNTER — Telehealth: Payer: Self-pay | Admitting: Endocrinology

## 2020-11-16 DIAGNOSIS — Z794 Long term (current) use of insulin: Secondary | ICD-10-CM

## 2020-11-16 DIAGNOSIS — E1165 Type 2 diabetes mellitus with hyperglycemia: Secondary | ICD-10-CM

## 2020-11-16 NOTE — Telephone Encounter (Signed)
Pt is needing this sent to [facility]. If any questions pt states can call her back   Continuous Blood Gluc Receiver (DEXCOM G6 RECEIVER) DEVI Continuous Blood Gluc Sensor ([place]) [place], [place]

## 2020-11-17 ENCOUNTER — Telehealth: Payer: Self-pay | Admitting: Endocrinology

## 2020-11-17 MED ORDER — DEXCOM G6 SENSOR MISC: 3 refills | Status: DC

## 2020-11-17 MED ORDER — DEXCOM G6 RECEIVER DEVI: 0 refills | Status: DC

## 2020-11-17 NOTE — Telephone Encounter (Signed)
Rx has been sent  

## 2020-11-17 NOTE — Telephone Encounter (Signed)
[facility] states that they have received a prescription for Dexcom g6 and g6 sensor but no transmitter.    [place], [place]

## 2020-11-21 NOTE — Telephone Encounter (Signed)
I called patient to get clarfication on where she wanted to get Dexcom supplies because I also received paperwork from [facility] pharmacies for Dexcom supplies too. Patient states that she will reach out to [facility] and see how much it would be from them.

## 2020-11-23 ENCOUNTER — Encounter: Payer: Self-pay | Admitting: Orthopedic Surgery

## 2020-11-23 NOTE — Progress Notes (Signed)
Office Visit Note   Patient: Marissa Roberts           Date of Birth: 1945/04/07           MRN: [id number] Visit Date: 11/15/2020 Requested by: Marissa Koch, MD [address] [place],  [place] [address] PCP: Marissa Koch, MD  Subjective: Chief Complaint  Patient presents with   Left Knee - Pain    HPI: Marissa Roberts is a patient underwent left knee patellar ossicle excision on 05/23/2020.  In the immediate postoperative period she did have good relief from her pain.  Now that lateral sided pain has recurred.  Hurts when she is getting up and down.  Walking on flat ground is not problem.  She has tried a topical for her symptoms.              ROS: All systems reviewed are negative as they relate to the chief complaint within the history of present illness.  Patient denies  fevers or chills.   Assessment & Plan: Visit Diagnoses:  1. Left knee pain, unspecified chronicity     Plan: Impression is unclear etiology of left-sided knee pain.  Plan CT scan left knee to evaluate the lateral patellofemoral compartment.  Bone scan to evaluate this anterior knee pain.  At this point this pain she is having may not have a clearly defined source that we can discover.  As such it would make it exceedingly difficult and potentially risky to treat.  Follow-Up Instructions: Return for after MRI.   Orders:  Orders Placed This Encounter  Procedures   XR Knee 1-2 Views Left   CT KNEE LEFT WO CONTRAST   NM Bone Scan 3 Phase Lower Extremity   No orders of the defined types were placed in this encounter.     Procedures: No procedures performed   Clinical Data: No additional findings.  Objective: Vital Signs: There were no vitals taken for this visit.  Physical Exam:   Constitutional: Patient appears well-developed HEENT:  Head: Normocephalic Eyes:EOM are normal Neck: Normal range of motion Cardiovascular: Normal rate Pulmonary/chest: Effort normal Neurologic:  Patient is alert Skin: Skin is warm Psychiatric: Patient has normal mood and affect   Ortho Exam: Ortho exam demonstrates full active and passive range of motion of the hip in ankle on the left.  Mild lateral tenderness around the lateral fat pad is present.  Extensor mechanism is intact.  No effusion or warmth in the knee.  Patient has very good stability to varus valgus stress at 0 30 and 90 degrees.  Specialty Comments:  No specialty comments available.  Imaging: No results found.   PMFS History: Patient Active Problem List   Diagnosis Date Noted   Displaced transverse fracture of left patella, subsequent encounter for closed fracture with delayed healing    Arthritis of knee 05/23/2020   Total knee replacement status 04/20/2020   Pain in knee region after total knee replacement ([facility]) 03/22/2020   Rash 10/11/2019   Arthritis of left knee 07/01/2019   Hypercholesterolemia 04/20/2019   B12 deficiency 03/23/2019   Anemia 02/05/2019   Rheumatoid arthritis with rheumatoid factor of multiple sites without organ or systems involvement ([facility]) 01/25/2019   High risk medication use 01/25/2019   Fatigue 09/18/2018   Routine general medical examination at a health care facility 02/07/2015   Morbid obesity ([facility]) 04/19/2013   Hyperlipidemia associated with type 2 diabetes mellitus ([facility]) 07/28/2009   Osteopenia 03/14/2009   Diabetes mellitus type  2 with complications ([facility]) 0000000   Hypothyroidism 09/24/2006   Essential hypertension 09/24/2006   Past Medical History:  Diagnosis Date   Anemia    Arthritis    Bronchitis    hx of   Diabetes mellitus    type 2   Family history of adverse reaction to anesthesia    per patient, "daughter threw up after a knee surgery"   GERD (gastroesophageal reflux disease)    Hypercholesteremia    Hypertension    Hypothyroidism    Pneumonia    hx of   PONV (postoperative nausea and vomiting)    Thyroid disease     Family History  Problem  Relation Age of Onset   Diabetes Mother    Heart disease Mother    Hyperlipidemia Mother    Diabetes Father    Diabetes Sister    Hypertension Sister    Heart disease Sister    Dementia Sister    Heart disease Brother    Healthy Daughter    Healthy Daughter    Healthy Daughter    Healthy Son     Past Surgical History:  Procedure Laterality Date   ABDOMINAL HYSTERECTOMY     partial   BREAST SURGERY     lumpectomy left breast   carpel tunnel     COLONOSCOPY  07/2020   COLONOSCOPY W/ POLYPECTOMY     FOOT SURGERY     knee arhtroscopy     KNEE ARTHROPLASTY Left 05/2020   ORIF PATELLA Left 05/23/2020   Procedure: LEFT KNEE POLE PATELLA EXCISION;  Surgeon: Marissa Pel, MD;  Location: [facility];  Service: Orthopedics;  Laterality: Left;   SHOULDER ARTHROSCOPY WITH SUBACROMIAL DECOMPRESSION, ROTATOR CUFF REPAIR AND BICEP TENDON REPAIR Right 05/26/2012   Procedure: Right Shoulder Diagnostic Operative Arthroscopy, Subacromial Decompression, Biceps Tenodesis, Mini Open Rotator Cuff Repair;  Surgeon: Marissa Pel, MD;  Location: [facility];  Service: Orthopedics;  Laterality: Right;  Right Shoulder Diagnostic Operative Arthroscopy, Subacromial Decompression, Biceps Tenodesis, Mini Open Rotator Cuff Repair   TONSILLECTOMY     TOTAL KNEE ARTHROPLASTY Left 07/01/2019   Procedure: LEFT TOTAL KNEE ARTHROPLASTY;  Surgeon: Marissa Pel, MD;  Location: [facility];  Service: Orthopedics;  Laterality: Left;   UPPER GI ENDOSCOPY  07/2020   Social History   Occupational History   Not on file  Tobacco Use   Smoking status: Former    Types: Cigarettes    Quit date: 04/01/1990    Years since quitting: 30.6   Smokeless tobacco: Never  Vaping Use   Vaping Use: Never used  Substance and Sexual Activity   Alcohol use: Not Currently    Comment: 06/22/2019: per patient about 20-25 years ago   Drug use: No   Sexual activity: Never    Birth control/protection: Abstinence

## 2020-11-25 ENCOUNTER — Other Ambulatory Visit: Payer: Self-pay | Admitting: Endocrinology

## 2020-11-25 DIAGNOSIS — E1165 Type 2 diabetes mellitus with hyperglycemia: Secondary | ICD-10-CM

## 2020-11-27 ENCOUNTER — Other Ambulatory Visit: Payer: Self-pay | Admitting: Internal Medicine

## 2020-11-30 ENCOUNTER — Encounter (INDEPENDENT_AMBULATORY_CARE_PROVIDER_SITE_OTHER): Payer: Self-pay

## 2020-11-30 ENCOUNTER — Other Ambulatory Visit: Payer: Self-pay

## 2020-11-30 ENCOUNTER — Ambulatory Visit (HOSPITAL_COMMUNITY)
Admission: RE | Admit: 2020-11-30 | Discharge: 2020-11-30 | Disposition: A | Discharge status: Home / Self Care | Payer: Medicare Other | Source: Ambulatory Visit | Attending: Orthopedic Surgery | Admitting: Orthopedic Surgery

## 2020-11-30 ENCOUNTER — Encounter (HOSPITAL_COMMUNITY)
Admission: RE | Admit: 2020-11-30 | Discharge: 2020-11-30 | Disposition: A | Discharge status: Home / Self Care | Payer: Medicare Other | Source: Ambulatory Visit | Attending: Orthopedic Surgery | Admitting: Orthopedic Surgery

## 2020-11-30 DIAGNOSIS — M1711 Unilateral primary osteoarthritis, right knee: Secondary | ICD-10-CM | POA: Diagnosis not present

## 2020-11-30 DIAGNOSIS — M25562 Pain in left knee: Secondary | ICD-10-CM | POA: Diagnosis not present

## 2020-11-30 DIAGNOSIS — Z96642 Presence of left artificial hip joint: Secondary | ICD-10-CM | POA: Diagnosis not present

## 2020-11-30 DIAGNOSIS — Z96652 Presence of left artificial knee joint: Secondary | ICD-10-CM | POA: Diagnosis not present

## 2020-11-30 MED ORDER — TECHNETIUM TC 99M MEDRONATE IV KIT
22.0000 | PACK | Freq: Once | INTRAVENOUS | Status: AC
  Administered 2020-11-30: 22 via INTRAVENOUS

## 2020-12-01 ENCOUNTER — Ambulatory Visit
Admission: RE | Admit: 2020-12-01 | Discharge: 2020-12-01 | Disposition: A | Discharge status: Home / Self Care | Payer: Medicare Other | Source: Ambulatory Visit | Attending: Orthopedic Surgery | Admitting: Orthopedic Surgery

## 2020-12-01 DIAGNOSIS — Z471 Aftercare following joint replacement surgery: Secondary | ICD-10-CM | POA: Diagnosis not present

## 2020-12-01 DIAGNOSIS — I739 Peripheral vascular disease, unspecified: Secondary | ICD-10-CM | POA: Diagnosis not present

## 2020-12-01 DIAGNOSIS — M25562 Pain in left knee: Secondary | ICD-10-CM | POA: Diagnosis not present

## 2020-12-01 DIAGNOSIS — M25462 Effusion, left knee: Secondary | ICD-10-CM | POA: Diagnosis not present

## 2020-12-04 ENCOUNTER — Other Ambulatory Visit: Payer: Self-pay | Admitting: Endocrinology

## 2020-12-15 ENCOUNTER — Other Ambulatory Visit: Payer: Self-pay | Admitting: Endocrinology

## 2020-12-15 ENCOUNTER — Other Ambulatory Visit: Payer: Self-pay

## 2020-12-15 ENCOUNTER — Encounter: Payer: Self-pay | Admitting: Orthopedic Surgery

## 2020-12-15 ENCOUNTER — Ambulatory Visit (INDEPENDENT_AMBULATORY_CARE_PROVIDER_SITE_OTHER): Payer: Medicare Other | Admitting: Orthopedic Surgery

## 2020-12-15 DIAGNOSIS — M25562 Pain in left knee: Secondary | ICD-10-CM | POA: Diagnosis not present

## 2020-12-17 NOTE — Progress Notes (Signed)
Office Visit Note   Patient: Marissa Roberts           Date of Birth: [birth year]           MRN: [id number] Visit Date: 12/15/2020 Requested by: [name], MD [address] [place],  [place] [address] PCP: [name], MD  Subjective: Chief Complaint  Patient presents with   Left Knee - Pain    HPI: Marissa Roberts is a 75 year old patient who underwent uncomplicated knee arthroplasty about 2 years ago.  Never had complete pain relief from that procedure.  She developed an inferior pole patella ossicle avulsion fracture which was excised in February of this year.  She did have some pain relief after that procedure but now predominantly lateral sided pain has recurred.  Since she was last seen she has had a bone scan which shows mild medial tibial plateau uptake which is where none of her symptoms are.  Lateral sided and patellar bone scan results are normal.  CT scan also unremarkable.  She has had no effusion or systemic signs or symptoms of infection.  She reports pain primarily getting up and down from a seated position.  When she walks there is no problem.  She localizes the pain to the lateral tibial plateau region.              ROS: All systems reviewed are negative as they relate to the chief complaint within the history of present illness.  Patient denies  fevers or chills.   Assessment & Plan: Visit Diagnoses:  1. Left knee pain, unspecified chronicity     Plan: Impression is temporary relief from inferior pole ossicle removal but now her lateral sided pain has recurred.  Bone scan and CT scan rule out bony mediated source of pain.  I think this could be some type of soft tissue pinching around that lateral tibial plateau.  Realistically the question is whether or not arthroscopic intervention would be predictably helpful for her symptoms.  I think every time we do go in and try to treat her pain source which remains elusive there is a risk of creating a bigger  problem than the one we are trying to solve.  As such it seems that her pain source is likely soft tissue related.  I did tell her explicitly that if her knee gets infected from any of these attempts to try to treat the remaining pain that she has been that would be a significantly worse situation than any pain she has right now.  Not sure that she fully understands that concept but does appreciate the fact that she is having continued pain which she does not feel like she can live with.  For that reason I would like for her to see one of my partners for second opinion regarding appropriateness of arthroscopic soft tissue debridement versus any type of revision procedure.  Again only 50% success with revision for pain as a primary complaint without identifying a discrete cause.  Follow-Up Instructions: No follow-ups on file.   Orders:  No orders of the defined types were placed in this encounter.  No orders of the defined types were placed in this encounter.     Procedures: No procedures performed   Clinical Data: No additional findings.  Objective: Vital Signs: There were no vitals taken for this visit.  Physical Exam:   Constitutional: Patient appears well-developed HEENT:  Head: Normocephalic Eyes:EOM are normal Neck: Normal range of motion Cardiovascular: Normal  rate Pulmonary/chest: Effort normal Neurologic: Patient is alert Skin: Skin is warm Psychiatric: Patient has normal mood and affect   Ortho Exam: Ortho exam demonstrates no groin pain with internal or external [name] of the leg.  No real tenderness over the iliotibial band over the lateral condyle.  Most of her tenderness is around the lateral tibial plateau.  Extensor mechanism is intact.  Incision is intact.  Range of motion is good from 0 to about 105 degrees.  Collaterals are stable.  Pedal pulses palpable.  Specialty Comments:  No specialty comments available.  Imaging: No results found.   PMFS  History: Patient Active Problem List   Diagnosis Date Noted   Displaced transverse fracture of left patella, subsequent encounter for closed fracture with delayed healing    Arthritis of knee 05/23/2020   Total knee replacement status 04/20/2020   Pain in knee region after total knee replacement ([name]) 03/22/2020   Rash 10/11/2019   Arthritis of left knee 07/01/2019   Hypercholesterolemia 04/20/2019   B12 deficiency 03/23/2019   Anemia 02/05/2019   Rheumatoid arthritis with rheumatoid factor of multiple sites without organ or systems involvement ([name]) 01/25/2019   High risk medication use 01/25/2019   Fatigue 09/18/2018   Routine general medical examination at a health care facility 02/07/2015   Morbid obesity ([name]) 04/19/2013   Hyperlipidemia associated with type 2 diabetes mellitus ([name]) 07/28/2009   Osteopenia 03/14/2009   Diabetes mellitus type 2 with complications ([name]) 0000000   Hypothyroidism 09/24/2006   Essential hypertension 09/24/2006   Past Medical History:  Diagnosis Date   Anemia    Arthritis    Bronchitis    hx of   Diabetes mellitus    type 2   Family history of adverse reaction to anesthesia    per patient, "daughter threw up after a knee surgery"   GERD (gastroesophageal reflux disease)    Hypercholesteremia    Hypertension    Hypothyroidism    Pneumonia    hx of   PONV (postoperative nausea and vomiting)    Thyroid disease     Family History  Problem Relation Age of Onset   Diabetes Mother    Heart disease Mother    Hyperlipidemia Mother    Diabetes Father    Diabetes Sister    Hypertension Sister    Heart disease Sister    Dementia Sister    Heart disease Brother    Healthy Daughter    Healthy Daughter    Healthy Daughter    Healthy Son     Past Surgical History:  Procedure Laterality Date   ABDOMINAL HYSTERECTOMY     partial   BREAST SURGERY     lumpectomy left breast   carpel tunnel     COLONOSCOPY  07/2020   COLONOSCOPY W/  POLYPECTOMY     FOOT SURGERY     knee arhtroscopy     KNEE ARTHROPLASTY Left 05/2020   ORIF PATELLA Left 05/23/2020   Procedure: LEFT KNEE POLE PATELLA EXCISION;  Surgeon: [name], MD;  Location: [facility];  Service: Orthopedics;  Laterality: Left;   SHOULDER ARTHROSCOPY WITH SUBACROMIAL DECOMPRESSION, ROTATOR CUFF REPAIR AND BICEP TENDON REPAIR Right 05/26/2012   Procedure: Right Shoulder Diagnostic Operative Arthroscopy, Subacromial Decompression, Biceps Tenodesis, Mini Open Rotator Cuff Repair;  Surgeon: [name], MD;  Location: [facility];  Service: Orthopedics;  Laterality: Right;  Right Shoulder Diagnostic Operative Arthroscopy, Subacromial Decompression, Biceps Tenodesis, Mini Open Rotator Cuff Repair   TONSILLECTOMY  TOTAL KNEE ARTHROPLASTY Left 07/01/2019   Procedure: LEFT TOTAL KNEE ARTHROPLASTY;  Surgeon: [name], MD;  Location: [facility];  Service: Orthopedics;  Laterality: Left;   UPPER GI ENDOSCOPY  07/2020   Social History   Occupational History   Not on file  Tobacco Use   Smoking status: Former    Types: Cigarettes    Quit date: 04/01/1990    Years since quitting: 30.7   Smokeless tobacco: Never  Vaping Use   Vaping Use: Never used  Substance and Sexual Activity   Alcohol use: Not Currently    Comment: 06/22/2019: per patient about 20-25 years ago   Drug use: No   Sexual activity: Never    Birth control/protection: Abstinence

## 2020-12-20 ENCOUNTER — Other Ambulatory Visit: Payer: Self-pay

## 2020-12-20 ENCOUNTER — Ambulatory Visit (INDEPENDENT_AMBULATORY_CARE_PROVIDER_SITE_OTHER): Payer: Medicare Other | Admitting: Orthopaedic Surgery

## 2020-12-20 DIAGNOSIS — G8929 Other chronic pain: Secondary | ICD-10-CM

## 2020-12-20 DIAGNOSIS — M25562 Pain in left knee: Secondary | ICD-10-CM

## 2020-12-20 DIAGNOSIS — Z96652 Presence of left artificial knee joint: Secondary | ICD-10-CM | POA: Diagnosis not present

## 2020-12-20 NOTE — Progress Notes (Signed)
The patient is a very pleasant 75 year old female who comes to me for second opinion sent by my partner Dr. Marlou Sa to evaluate a painful left total knee arthroplasty.  She had her left knee replaced in April 2021 secondary to severe end-stage arthritis of the left knee.  There was a small ossicle of bone that was noted at the inferior pole of patella that had to be removed sometime later.  She says originally the knee had done really well for her but now she does have pain on the lateral aspect of her knee when she goes from a sitting position to a standing position.  It does not hurt with weightbearing and it does not wake her up at night but there is definitely something mechanical that she feels that is causing her pain just with that activity and this can be frustrating for her at times.  I have reviewed the plain films of her knee as well as the bone scan and even a CT scan.  The bone scan suggested some slight uptake on the medial aspect of the tibia but she is asymptomatic in that area and to me, the plain films and the CT scan showed no evidence of prosthetic loosening.  I have reviewed these thoroughly.  I did examine her left knee in the office today.  There is no effusion at all.  There is no redness.  Her range of motion is full.  When I have her get up on the exam table and I flexed her knee to about 30 degrees, there is a little bit of play with varus and valgus stressing but the drawer sign is negative.  Other than clicking, this did not cause any symptoms for her.  She does have pain along the lateral joint line and some of this is at the IT band but is more of a global area of pain.  I did clean that area of her knee thoroughly with alcohol swabs.  I then placed 5 cc of lidocaine all around the area where she was hurting.  I then had her sit down we talked for a while.  When I had her stand back up she did stated that her pain had dissipated significantly from the lidocaine.  I did let her know  that I would discuss my findings with Dr. Marlou Sa so we could collectively come up with the best treatment plan for her.  All questions and concerns were answered and addressed.  We will arrange follow-up at a later time.

## 2020-12-28 DIAGNOSIS — E119 Type 2 diabetes mellitus without complications: Secondary | ICD-10-CM | POA: Diagnosis not present

## 2021-01-02 ENCOUNTER — Ambulatory Visit: Payer: Medicare Other | Admitting: Orthopaedic Surgery

## 2021-01-03 ENCOUNTER — Other Ambulatory Visit: Payer: Self-pay | Admitting: Internal Medicine

## 2021-01-03 ENCOUNTER — Other Ambulatory Visit: Payer: Self-pay | Admitting: Endocrinology

## 2021-01-04 DIAGNOSIS — Z86010 Personal history of colonic polyps: Secondary | ICD-10-CM | POA: Diagnosis not present

## 2021-01-04 DIAGNOSIS — K5904 Chronic idiopathic constipation: Secondary | ICD-10-CM | POA: Diagnosis not present

## 2021-01-04 DIAGNOSIS — K2270 Barrett's esophagus without dysplasia: Secondary | ICD-10-CM | POA: Diagnosis not present

## 2021-01-05 ENCOUNTER — Other Ambulatory Visit: Payer: Self-pay | Admitting: Endocrinology

## 2021-01-09 ENCOUNTER — Other Ambulatory Visit: Payer: Self-pay | Admitting: Rheumatology

## 2021-01-09 ENCOUNTER — Other Ambulatory Visit: Payer: Self-pay

## 2021-01-09 ENCOUNTER — Other Ambulatory Visit: Payer: Self-pay | Admitting: *Deleted

## 2021-01-09 ENCOUNTER — Ambulatory Visit (INDEPENDENT_AMBULATORY_CARE_PROVIDER_SITE_OTHER): Payer: Medicare Other

## 2021-01-09 ENCOUNTER — Telehealth: Payer: Self-pay | Admitting: Orthopedic Surgery

## 2021-01-09 VITALS — BP 140/60 | HR 89 | Temp 98.4°F | Ht 65.0 in | Wt 236.0 lb

## 2021-01-09 DIAGNOSIS — Z0000 Encounter for general adult medical examination without abnormal findings: Secondary | ICD-10-CM | POA: Diagnosis not present

## 2021-01-09 DIAGNOSIS — Z79899 Other long term (current) drug therapy: Secondary | ICD-10-CM

## 2021-01-09 DIAGNOSIS — Z111 Encounter for screening for respiratory tuberculosis: Secondary | ICD-10-CM

## 2021-01-09 DIAGNOSIS — Z9225 Personal history of immunosupression therapy: Secondary | ICD-10-CM

## 2021-01-09 DIAGNOSIS — M0579 Rheumatoid arthritis with rheumatoid factor of multiple sites without organ or systems involvement: Secondary | ICD-10-CM | POA: Diagnosis not present

## 2021-01-09 NOTE — Telephone Encounter (Signed)
Patient walked into office today stating her knee is getting worse and she have not heard back from Dr Ninfa Linden. Dr Ninfa Linden was going to discuss with Dr Marlou Sa and come up with a plan for treatment. Patient request a call back with an appointment which whomever can or needs to see her .

## 2021-01-09 NOTE — Progress Notes (Addendum)
Subjective:   Marissa Roberts is a 75 y.o. female who presents for Medicare Annual (Subsequent) preventive examination.  Review of Systems     Cardiac Risk Factors include: advanced age (>28men, >84 women);diabetes mellitus;dyslipidemia;family history of premature cardiovascular disease;hypertension;obesity (BMI >30kg/m2)     Objective:    Today's Vitals   01/09/21 0918 01/09/21 0925  BP:  140/60  Pulse:  89  Temp:  98.4 F (36.9 C)  SpO2:  96%  Weight:  236 lb (107 kg)  Height:  5\' 5"  (1.651 m)  PainSc: 9  9   PainLoc:  Knee   Body mass index is 39.27 kg/m.  Advanced Directives 01/09/2021 05/23/2020 05/16/2020 07/16/2019 07/04/2019 06/22/2019 09/16/2017  Does Patient Have a Medical Advance Directive? Yes Yes No Yes Yes Yes Yes  Type of Advance Directive - Living will - Healthcare Power of [place];Living will [place];Living will  Does patient want to make changes to medical advance directive? No - Patient declined No - Patient declined - - No - Patient declined - -  Copy of [place] in Chart? - - - - - No - copy requested -  Would patient like information on creating a medical advance directive? No - Patient declined No - Patient declined No - Patient declined - - - -    Current Medications (verified) Outpatient Encounter Medications as of 01/09/2021  Medication Sig   amLODipine (NORVASC) 10 MG tablet Take 1 tablet (10 mg total) by mouth daily.   BD PEN NEEDLE NANO U/F 32G X 4 MM MISC USE  4 TIMES DAILY TO  INJECT  INSULIN   Biotin 10000 MCG TABS Take 10,000 mcg by mouth daily.   Calcium Carb-Cholecalciferol (CALCIUM 600 + D PO) Take 2 tablets by mouth daily.   cephALEXin (KEFLEX) 500 MG capsule TAKE 4 CAPSULES (2,000 MG) 30-60 MINUTES PRIOR TO DENTAL PROCEDURE   clotrimazole (LOTRIMIN) 1 % cream Apply to both feet and between toes twice daily for 6 weeks.   Continuous Blood  Gluc Receiver (DEXCOM G6 RECEIVER) DEVI For sensor monitoring   Continuous Blood Gluc Sensor (DEXCOM G6 SENSOR) MISC Use to monitor blood sugar, change after 10 days   Continuous Blood Gluc Transmit (DEXCOM G6 TRANSMITTER) MISC 4 PER YEAR   dapagliflozin propanediol (FARXIGA) 5 MG TABS tablet Take 1 tablet (5 mg total) by mouth daily.   diclofenac Sodium (VOLTAREN) 1 % GEL Apply 2-4 grams to affected joint 4 times daily as needed.   fluticasone (FLONASE) 50 MCG/ACT nasal spray Place 2 sprays into both nostrils 2 (two) times daily as needed for allergies or rhinitis.   furosemide (LASIX) 40 MG tablet Take 2 tablets by mouth once daily   Glucosamine HCl-MSM (GLUCOSAMINE-MSM PO) Take 1 tablet by mouth daily.   glucose blood (ONETOUCH VERIO) test strip CHECK BLOOD SUGAR 4 TIMES DAILY   HUMIRA PEN 40 MG/0.4ML PNKT INJECT 1 PEN UNDER THE SKIN EVERY 14 DAYS.   Lancets (ONETOUCH DELICA PLUS [id number]) MISC USE TO CHECK BLOOD SUGAR UP TO THREE TIMES A DAY 2 HOURS AFTER A MEAL AND UP TO 3 TIMES A WEEK UPON WAKING UP   latanoprost (XALATAN) 0.005 % ophthalmic solution SMARTSIG:1 Drop(s) In Eye(s) Every Evening   leflunomide (ARAVA) 20 MG tablet Take 1 tablet by mouth once daily   levothyroxine (SYNTHROID) 112 MCG tablet Take 1 tablet (112 mcg total) by mouth daily before breakfast.   linaclotide (  LINZESS) 290 MCG CAPS capsule Take 1 capsule (290 mcg total) by mouth daily as needed. (Patient taking differently: Take 290 mcg by mouth daily as needed (constipation).)   metFORMIN (GLUCOPHAGE) 1000 MG tablet TAKE 1 TABLET BY MOUTH TWICE DAILY WITH A MEAL   methocarbamol (ROBAXIN) 500 MG tablet Take 1 tablet (500 mg total) by mouth every 8 (eight) hours as needed for muscle spasms.   montelukast (SINGULAIR) 10 MG tablet TAKE 1 TABLET BY MOUTH AT BEDTIME   Multiple Vitamin (MULTIVITAMIN WITH MINERALS) TABS tablet Take 1 tablet by mouth daily.   NOVOLOG FLEXPEN 100 UNIT/ML FlexPen INJECT 10-15 UNITS SUBCUTANEOUSLY  THREE TIMES DAILY BEFORE MEAL(S)   omeprazole (PRILOSEC) 40 MG capsule Take 40 mg by mouth daily.   potassium chloride (KLOR-CON) 10 MEQ tablet Take 1 tablet (10 mEq total) by mouth daily.   rosuvastatin (CRESTOR) 40 MG tablet Take 1 tablet by mouth once daily   traMADol (ULTRAM) 50 MG tablet Take 1 tablet (50 mg total) by mouth daily as needed.   TRESIBA FLEXTOUCH 200 UNIT/ML FlexTouch Pen INJECT 36 UNITS SUBCUTANEOUSLY IN THE MORNING   valsartan-hydrochlorothiazide (DIOVAN-HCT) 320-12.5 MG tablet Take 1 tablet by mouth once daily   [DISCONTINUED] Saxagliptin-Metformin 07-998 MG TB24 Take 1 tablet by mouth daily.   No facility-administered encounter medications on file as of 01/09/2021.    Allergies (verified) Contrast media [iodinated diagnostic agents], Adhesive [tape], Erythromycin, and Penicillins   History: Past Medical History:  Diagnosis Date   Anemia    Arthritis    Bronchitis    hx of   Diabetes mellitus    type 2   Family history of adverse reaction to anesthesia    per patient, "daughter threw up after a knee surgery"   GERD (gastroesophageal reflux disease)    Hypercholesteremia    Hypertension    Hypothyroidism    Pneumonia    hx of   PONV (postoperative nausea and vomiting)    Thyroid disease    Past Surgical History:  Procedure Laterality Date   ABDOMINAL HYSTERECTOMY     partial   BREAST SURGERY     lumpectomy left breast   carpel tunnel     COLONOSCOPY  07/2020   COLONOSCOPY W/ POLYPECTOMY     FOOT SURGERY     knee arhtroscopy     KNEE ARTHROPLASTY Left 05/2020   ORIF PATELLA Left 05/23/2020   Procedure: LEFT KNEE POLE PATELLA EXCISION;  Surgeon: [name], MD;  Location: [place];  Service: Orthopedics;  Laterality: Left;   SHOULDER ARTHROSCOPY WITH SUBACROMIAL DECOMPRESSION, ROTATOR CUFF REPAIR AND BICEP TENDON REPAIR Right 05/26/2012   Procedure: Right Shoulder Diagnostic Operative Arthroscopy, Subacromial Decompression, Biceps Tenodesis,  Mini Open Rotator Cuff Repair;  Surgeon: [name], MD;  Location: [place];  Service: Orthopedics;  Laterality: Right;  Right Shoulder Diagnostic Operative Arthroscopy, Subacromial Decompression, Biceps Tenodesis, Mini Open Rotator Cuff Repair   TONSILLECTOMY     TOTAL KNEE ARTHROPLASTY Left 07/01/2019   Procedure: LEFT TOTAL KNEE ARTHROPLASTY;  Surgeon: [name], MD;  Location: [place];  Service: Orthopedics;  Laterality: Left;   UPPER GI ENDOSCOPY  07/2020   Family History  Problem Relation Age of Onset   Diabetes Mother    Heart disease Mother    Hyperlipidemia Mother    Diabetes Father    Diabetes Sister    Hypertension Sister    Heart disease Sister    Dementia Sister    Heart disease Brother  Healthy Daughter    Healthy Daughter    Healthy Daughter    Healthy Son    Social History   Socioeconomic History   Marital status: Married    Spouse name: Not on file   Number of children: Not on file   Years of education: Not on file   Highest education level: Not on file  Occupational History   Not on file  Tobacco Use   Smoking status: Former    Types: Cigarettes    Quit date: 04/01/1990    Years since quitting: 30.7   Smokeless tobacco: Never  Vaping Use   Vaping Use: Never used  Substance and Sexual Activity   Alcohol use: Not Currently    Comment: 06/22/2019: per patient about 20-25 years ago   Drug use: No   Sexual activity: Never    Birth control/protection: Abstinence  Other Topics Concern   Not on file  Social History Narrative   Not on file   Social Determinants of Health   Financial Resource Strain: Low Risk    Difficulty of Paying Living Expenses: Not hard at all  Food Insecurity: No Food Insecurity   Worried About Charity fundraiser in the Last Year: Never true   [place] in the Last Year: Never true  Transportation Needs: No Transportation Needs   Lack of Transportation (Medical): No   Lack of Transportation (Non-Medical):  No  Physical Activity: Inactive   Days of Exercise per Week: 0 days   Minutes of Exercise per Session: 0 min  Stress: No Stress Concern Present   Feeling of Stress : Not at all  Social Connections: Socially Integrated   Frequency of Communication with Friends and Family: More than three times a week   Frequency of Social Gatherings with Friends and Family: More than three times a week   Attends Religious Services: More than 4 times per year   Active Member of [employer] or Organizations: Yes   Attends Music therapist: More than 4 times per year   Marital Status: Married    Tobacco Counseling Counseling given: Not Answered   Clinical Intake:  Pre-visit preparation completed: Yes  Pain : No/denies pain Pain Score: 9      BMI - recorded: 39.27 Nutritional Risks: None Diabetes: Yes CBG done?: No Did pt. bring in CBG monitor from home?: No  How often do you need to have someone help you when you read instructions, pamphlets, or other written materials from your doctor or pharmacy?: 1 - Never What is the last grade level you completed in school?: HSG  Diabetic? yes  Interpreter Needed?: No  Information entered by :: Lisette Abu, LPN   Activities of Daily Living In your present state of health, do you have any difficulty performing the following activities: 01/09/2021 05/16/2020  Hearing? N N  Vision? N N  Difficulty concentrating or making decisions? N N  Walking or climbing stairs? N Y  Dressing or bathing? N N  Doing errands, shopping? N N  Preparing Food and eating ? N -  Using the Toilet? N -  In the past six months, have you accidently leaked urine? N -  Do you have problems with loss of bowel control? N -  Managing your Medications? N -  Managing your Finances? N -  Housekeeping or managing your Housekeeping? N -  Some recent data might be hidden    Patient Care Team: [name], MD as PCP - General (  Internal Medicine) Elayne Snare, MD as Consulting Physician (Endocrinology) Juluis Rainier as Consulting Physician (Optometry) Lady Gary, [facility] as Consulting Physician (Obstetrics and Gynecology)  Indicate any recent Medical Services you may have received from other than [facility] providers in the past year (date may be approximate).     Assessment:   This is a routine wellness examination for [name].  Hearing/Vision screen Hearing Screening - Comments:: Patient denied any hearing difficulty.   No hearing aids.  Vision Screening - Comments:: Patient wears corrective glasses/contacts.  Eye exam done annually by: Dr. Alois Cliche.  Dietary issues and exercise activities discussed: Current Exercise Habits: The patient does not participate in regular exercise at present, Exercise limited by: orthopedic condition(s)   Goals Addressed               This Visit's Progress     Patient Stated (pt-stated)        No goals at this time.      Depression Screen PHQ 2/9 Scores 01/09/2021 09/25/2020 09/21/2019 09/18/2018 09/16/2017 09/16/2017 09/12/2016  PHQ - 2 Score 0 0 0 0 0 0 0  PHQ- 9 Score - - - - 2 - -    Fall Risk Fall Risk  01/09/2021 03/22/2020 09/21/2019 09/18/2018 09/16/2017  Falls in the past year? 0 0 0 0 No  Number falls in past yr: 0 0 - - -  Injury with Fall? 0 - - - -  Risk for fall due to : No Fall Risks History of fall(s) - - -  Follow up Falls evaluation completed Falls evaluation completed - - -    FALL RISK PREVENTION PERTAINING TO THE HOME:  Any stairs in or around the home? No  If so, are there any without handrails? No  Home free of loose throw rugs in walkways, pet beds, electrical cords, etc? Yes  Adequate lighting in your home to reduce risk of falls? Yes   ASSISTIVE DEVICES UTILIZED TO PREVENT FALLS:  Life alert? No  Use of a cane, walker or w/c? No  Grab bars in the bathroom? Yes  Shower chair or bench in shower? No  Elevated toilet seat or a handicapped toilet? Yes    TIMED UP AND GO:  Was the test performed? Yes .  Length of time to ambulate 10 feet: 6 sec.   Gait steady and fast without use of assistive device  Cognitive Function: Normal cognitive status assessed by direct observation by this Nurse Health Advisor. No abnormalities found.          Immunizations Immunization History  Administered Date(s) Administered   Fluad Quad(high Dose 65+) 01/02/2019, 12/31/2019   Influenza Split 01/03/2011   Influenza Whole 04/01/2005, 12/18/2009   Influenza, High Dose Seasonal PF 01/07/2014, 01/27/2015, 01/02/2016, 01/13/2017, 01/16/2018   Influenza-Unspecified 11/30/2012, 01/03/2021   PFIZER(Purple Top)SARS-COV-2 Vaccination 05/28/2019, 06/23/2019, 01/15/2020, 07/03/2020, 01/03/2021   Pneumococcal Conjugate-13 09/12/2016, 01/02/2019   Pneumococcal Polysaccharide-23 07/15/2014, 12/27/2019   Td 04/01/2001, 10/07/2013    TDAP status: Up to date  Flu Vaccine status: Up to date  Pneumococcal vaccine status: Up to date  Covid-19 vaccine status: Completed vaccines  Qualifies for Shingles Vaccine? Yes   Zostavax completed No   Shingrix Completed?: No.    Education has been provided regarding the importance of this vaccine. Patient has been advised to call insurance company to determine out of pocket expense if they have not yet received this vaccine. Advised may also receive vaccine at local pharmacy or Health Dept.  Verbalized acceptance and understanding.  Screening Tests Health Maintenance  Topic Date Due   Zoster Vaccines- Shingrix (1 of 2) Never done   OPHTHALMOLOGY EXAM  12/13/2020   HEMOGLOBIN A1C  04/26/2021   FOOT EXAM  09/25/2021   MAMMOGRAM  04/20/2022   COLONOSCOPY (Pts 45-4yrs Insurance coverage will need to be confirmed)  08/24/2023   TETANUS/TDAP  10/08/2023   INFLUENZA VACCINE  Completed   DEXA SCAN  Completed   COVID-19 Vaccine  Completed   Hepatitis C Screening  Completed   HPV VACCINES  Aged Out    Health  Maintenance  Health Maintenance Due  Topic Date Due   Zoster Vaccines- Shingrix (1 of 2) Never done   OPHTHALMOLOGY EXAM  12/13/2020    Colorectal cancer screening: Type of screening: Colonoscopy. Completed 08/23/2020. Repeat every 3 years  Mammogram status: Completed 04/2020. Repeat every year  Bone density status: never done  Lung Cancer Screening: (Low Dose CT Chest recommended if Age 18-80 years, 30 pack-year currently smoking OR have quit w/in 15years.) does not qualify.   Lung Cancer Screening Referral: no  Additional Screening:  Hepatitis C Screening: does not qualify; Completed no  Vision Screening: Recommended annual ophthalmology exams for early detection of glaucoma and other disorders of the eye. Is the patient up to date with their annual eye exam?  Yes  Who is the provider or what is the name of the office in which the patient attends annual eye exams? Dr. Alois Cliche If pt is not established with a provider, would they like to be referred to a provider to establish care? No .   Dental Screening: Recommended annual dental exams for proper oral hygiene  Community Resource Referral / Chronic Care Management: CRR required this visit?  No   CCM required this visit?  No      Plan:     I have personally reviewed and noted the following in the patient's chart:   Medical and social history Use of alcohol, tobacco or illicit drugs  Current medications and supplements including opioid prescriptions.  Functional ability and status Nutritional status Physical activity Advanced directives List of other physicians Hospitalizations, surgeries, and ER visits in previous 12 months Vitals Screenings to include cognitive, depression, and falls Referrals and appointments  In addition, I have reviewed and discussed with patient certain preventive protocols, quality metrics, and best practice recommendations. A written personalized care plan for preventive services as well  as general preventive health recommendations were provided to patient.     Sheral Flow, LPN   [id number]   Nurse Notes:  Hearing Screening - Comments:: Patient denied any hearing difficulty.   No hearing aids.  Vision Screening - Comments:: Patient wears corrective glasses/contacts.  Eye exam done annually by: Dr. Alois Cliche.

## 2021-01-09 NOTE — Telephone Encounter (Signed)
Next Visit: 02/26/2021  Last Visit: 09/18/2020  Last Fill: 10/09/2020  DX:  Rheumatoid arthritis involving multiple sites with positive rheumatoid factor   Current Dose per office note 09/18/2020:  Arava 20 mg 1 tablet by mouth daily  Labs: 09/18/2020, CBC and CMP WNL, I called patient, labs are due  Okay to refill [place]?

## 2021-01-09 NOTE — Patient Instructions (Signed)
Marissa Roberts , Thank you for taking time to come for your Medicare Wellness Visit. I appreciate your ongoing commitment to your health goals. Please review the following plan we discussed and let me know if I can assist you in the future.   Screening recommendations/referrals: Colonoscopy: 08/23/2020; due every 3 years Mammogram: 04/10/2017; due every 1-2 years Bone Density: never done Recommended yearly ophthalmology/optometry visit for glaucoma screening and checkup Recommended yearly dental visit for hygiene and checkup  Vaccinations: Influenza vaccine: 01/03/2021 Pneumococcal vaccine: 12/27/2019, 01/02/2019 Tdap vaccine: 10/07/2013; due every 10 years Shingles vaccine: never done   Covid-19:05/28/2019, 06/23/2019, 01/15/2020, 07/03/2020, 01/03/2021  Advanced directives: Please bring a copy of your health care power of attorney and living will to the office at your convenience.  Conditions/risks identified: Yes; Client understands the importance of follow-up with providers by attending scheduled visits and discussed goals to eat healthier, increase physical activity, exercise the brain, socialize more, get enough sleep and make time for laughter.  Next appointment: Please schedule your next Medicare Wellness Visit with your Nurse Health Advisor in 1 year by calling [phone].   Preventive Care 73 Years and Older, Female Preventive care refers to lifestyle choices and visits with your health care provider that can promote health and wellness. What does preventive care include? A yearly physical exam. This is also called an annual well check. Dental exams once or twice a year. Routine eye exams. Ask your health care provider how often you should have your eyes checked. Personal lifestyle choices, including: Daily care of your teeth and gums. Regular physical activity. Eating a healthy diet. Avoiding tobacco and drug use. Limiting alcohol use. Practicing safe sex. Taking low-dose aspirin  every day. Taking vitamin and mineral supplements as recommended by your health care provider. What happens during an annual well check? The services and screenings done by your health care provider during your annual well check will depend on your age, overall health, lifestyle risk factors, and family history of disease. Counseling  Your health care provider may ask you questions about your: Alcohol use. Tobacco use. Drug use. Emotional well-being. Home and relationship well-being. Sexual activity. Eating habits. History of falls. Memory and ability to understand (cognition). Work and work Statistician. Reproductive health. Screening  You may have the following tests or measurements: Height, weight, and BMI. Blood pressure. Lipid and cholesterol levels. These may be checked every 5 years, or more frequently if you are over 59 years old. Skin check. Lung cancer screening. You may have this screening every year starting at age 82 if you have a 30-pack-year history of smoking and currently smoke or have quit within the past 15 years. Fecal occult blood test (FOBT) of the stool. You may have this test every year starting at age 55. Flexible sigmoidoscopy or colonoscopy. You may have a sigmoidoscopy every 5 years or a colonoscopy every 10 years starting at age 85. Hepatitis C blood test. Hepatitis B blood test. Sexually transmitted disease (STD) testing. Diabetes screening. This is done by checking your blood sugar (glucose) after you have not eaten for a while (fasting). You may have this done every 1-3 years. Bone density scan. This is done to screen for osteoporosis. You may have this done starting at age 51. Mammogram. This may be done every 1-2 years. Talk to your health care provider about how often you should have regular mammograms. Talk with your health care provider about your test results, treatment options, and if necessary, the need for more tests. Vaccines  Your health  care provider may recommend certain vaccines, such as: Influenza vaccine. This is recommended every year. Tetanus, diphtheria, and acellular pertussis (Tdap, Td) vaccine. You may need a Td booster every 10 years. Zoster vaccine. You may need this after age 37. Pneumococcal 13-valent conjugate (PCV13) vaccine. One dose is recommended after age 30. Pneumococcal polysaccharide (PPSV23) vaccine. One dose is recommended after age 49. Talk to your health care provider about which screenings and vaccines you need and how often you need them. This information is not intended to replace advice given to you by your health care provider. Make sure you discuss any questions you have with your health care provider. Document Released: 04/14/2015 Document Revised: 12/06/2015 Document Reviewed: 01/17/2015 Elsevier Interactive Patient Education  2017 [place] Prevention in the Home Falls can cause injuries. They can happen to people of all ages. There are many things you can do to make your home safe and to help prevent falls. What can I do on the outside of my home? Regularly fix the edges of walkways and driveways and fix any cracks. Remove anything that might make you trip as you walk through a door, such as a raised step or threshold. Trim any bushes or trees on the path to your home. Use bright outdoor lighting. Clear any walking paths of anything that might make someone trip, such as rocks or tools. Regularly check to see if handrails are loose or broken. Make sure that both sides of any steps have handrails. Any raised decks and porches should have guardrails on the edges. Have any leaves, snow, or ice cleared regularly. Use sand or salt on walking paths during winter. Clean up any spills in your garage right away. This includes oil or grease spills. What can I do in the bathroom? Use night lights. Install grab bars by the toilet and in the tub and shower. Do not use towel bars as grab  bars. Use non-skid mats or decals in the tub or shower. If you need to sit down in the shower, use a plastic, non-slip stool. Keep the floor dry. Clean up any water that spills on the floor as soon as it happens. Remove soap buildup in the tub or shower regularly. Attach bath mats securely with double-sided non-slip rug tape. Do not have throw rugs and other things on the floor that can make you trip. What can I do in the bedroom? Use night lights. Make sure that you have a light by your bed that is easy to reach. Do not use any sheets or blankets that are too big for your bed. They should not hang down onto the floor. Have a firm chair that has side arms. You can use this for support while you get dressed. Do not have throw rugs and other things on the floor that can make you trip. What can I do in the kitchen? Clean up any spills right away. Avoid walking on wet floors. Keep items that you use a lot in easy-to-reach places. If you need to reach something above you, use a strong step stool that has a grab bar. Keep electrical cords out of the way. Do not use floor polish or wax that makes floors slippery. If you must use wax, use non-skid floor wax. Do not have throw rugs and other things on the floor that can make you trip. What can I do with my stairs? Do not leave any items on the stairs. Make sure that there are  handrails on both sides of the stairs and use them. Fix handrails that are broken or loose. Make sure that handrails are as long as the stairways. Check any carpeting to make sure that it is firmly attached to the stairs. Fix any carpet that is loose or worn. Avoid having throw rugs at the top or bottom of the stairs. If you do have throw rugs, attach them to the floor with carpet tape. Make sure that you have a light switch at the top of the stairs and the bottom of the stairs. If you do not have them, ask someone to add them for you. What else can I do to help prevent  falls? Wear shoes that: Do not have high heels. Have rubber bottoms. Are comfortable and fit you well. Are closed at the toe. Do not wear sandals. If you use a stepladder: Make sure that it is fully opened. Do not climb a closed stepladder. Make sure that both sides of the stepladder are locked into place. Ask someone to hold it for you, if possible. Clearly mark and make sure that you can see: Any grab bars or handrails. First and last steps. Where the edge of each step is. Use tools that help you move around (mobility aids) if they are needed. These include: Canes. Walkers. Scooters. Crutches. Turn on the lights when you go into a dark area. Replace any light bulbs as soon as they burn out. Set up your furniture so you have a clear path. Avoid moving your furniture around. If any of your floors are uneven, fix them. If there are any pets around you, be aware of where they are. Review your medicines with your doctor. Some medicines can make you feel dizzy. This can increase your chance of falling. Ask your doctor what other things that you can do to help prevent falls. This information is not intended to replace advice given to you by your health care provider. Make sure you discuss any questions you have with your health care provider. Document Released: 01/12/2009 Document Revised: 08/24/2015 Document Reviewed: 04/22/2014 Elsevier Interactive Patient Education  2017 [employer].

## 2021-01-10 NOTE — Progress Notes (Signed)
Glucose is elevated-163. Rest of CMP WNL.  Absolute lymphocyte count is borderline elevated.  WBC count is WNL.  Rest of CBC WNL.  We will continue to monitor.

## 2021-01-12 DIAGNOSIS — E119 Type 2 diabetes mellitus without complications: Secondary | ICD-10-CM | POA: Diagnosis not present

## 2021-01-12 DIAGNOSIS — H401131 Primary open-angle glaucoma, bilateral, mild stage: Secondary | ICD-10-CM | POA: Diagnosis not present

## 2021-01-12 LAB — HM DIABETES EYE EXAM

## 2021-01-13 LAB — COMPLETE METABOLIC PANEL WITH GFR
AG Ratio: 1.6 (calc) (ref 1.0–2.5)
ALT: 15 U/L (ref 6–29)
AST: 15 U/L (ref 10–35)
Albumin: 4.2 g/dL (ref 3.6–5.1)
Alkaline phosphatase (APISO): 47 U/L (ref 37–153)
BUN: 13 mg/dL (ref 7–25)
CO2: 29 mmol/L (ref 20–32)
Calcium: 10.1 mg/dL (ref 8.6–10.4)
Chloride: 102 mmol/L (ref 98–110)
Creat: 0.67 mg/dL (ref 0.60–1.00)
Globulin: 2.7 g/dL (calc) (ref 1.9–3.7)
Glucose, Bld: 163 mg/dL — ABNORMAL HIGH (ref 65–99)
Potassium: 4 mmol/L (ref 3.5–5.3)
Sodium: 140 mmol/L (ref 135–146)
Total Bilirubin: 0.3 mg/dL (ref 0.2–1.2)
Total Protein: 6.9 g/dL (ref 6.1–8.1)
eGFR: 92 mL/min/{1.73_m2} (ref >=60)

## 2021-01-13 LAB — CBC WITH DIFFERENTIAL/PLATELET
Absolute Monocytes: 840 cells/uL (ref 200–950)
Basophils Absolute: 80 cells/uL (ref 0–200)
Basophils Relative: 0.8 %
Eosinophils Absolute: 270 cells/uL (ref 15–500)
Eosinophils Relative: 2.7 %
HCT: 37 % (ref 35.0–45.0)
Hemoglobin: 12.1 g/dL (ref 11.7–15.5)
Lymphs Abs: 4110 cells/uL — ABNORMAL HIGH (ref 850–3900)
MCH: 28.8 pg (ref 27.0–33.0)
MCHC: 32.7 g/dL (ref 32.0–36.0)
MCV: 88.1 fL (ref 80.0–100.0)
MPV: 12 fL (ref 7.5–12.5)
Monocytes Relative: 8.4 %
Neutro Abs: 4700 cells/uL (ref 1500–7800)
Neutrophils Relative %: 47 %
Platelets: 225 10*3/uL (ref 140–400)
RBC: 4.2 10*6/uL (ref 3.80–5.10)
RDW: 13.9 % (ref 11.0–15.0)
Total Lymphocyte: 41.1 %
WBC: 10 10*3/uL (ref 3.8–10.8)

## 2021-01-13 LAB — QUANTIFERON-TB GOLD PLUS
Mitogen-NIL: >10 IU/mL
NIL: 0.06 IU/mL
QuantiFERON-TB Gold Plus: NEGATIVE
TB1-NIL: <0 IU/mL
TB2-NIL: 0.01 IU/mL

## 2021-01-15 NOTE — Progress Notes (Signed)
TB gold negative

## 2021-01-16 ENCOUNTER — Encounter: Payer: Medicare Other | Attending: Endocrinology | Admitting: Nutrition

## 2021-01-16 ENCOUNTER — Other Ambulatory Visit: Payer: Self-pay

## 2021-01-16 DIAGNOSIS — Z794 Long term (current) use of insulin: Secondary | ICD-10-CM | POA: Diagnosis not present

## 2021-01-16 DIAGNOSIS — E119 Type 2 diabetes mellitus without complications: Secondary | ICD-10-CM | POA: Insufficient documentation

## 2021-01-16 DIAGNOSIS — E118 Type 2 diabetes mellitus with unspecified complications: Secondary | ICD-10-CM

## 2021-01-16 NOTE — Patient Instructions (Signed)
Finish setting up the clarity app with user name and password from the dexcom app. Follow directions given to link clarity app readings to our practice using the pairing code given.

## 2021-01-16 NOTE — Progress Notes (Signed)
Patient here to review how to use the Dexcom.  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  We linked the sensor to the phone, but was not able to download the Clarity app, because she can not get her gmail on her phone to confirm her account.  Directions given to her to have her daughter to set up the clarity appy and link her phone to [place], using a connect code given to her.   My number was written on the manual to call me if daughter needs help to link clarity app to [place].

## 2021-01-17 ENCOUNTER — Ambulatory Visit (INDEPENDENT_AMBULATORY_CARE_PROVIDER_SITE_OTHER): Payer: Medicare Other | Admitting: Podiatry

## 2021-01-17 ENCOUNTER — Encounter: Payer: Self-pay | Admitting: Podiatry

## 2021-01-17 DIAGNOSIS — M79675 Pain in left toe(s): Secondary | ICD-10-CM

## 2021-01-17 DIAGNOSIS — L84 Corns and callosities: Secondary | ICD-10-CM

## 2021-01-17 DIAGNOSIS — M79674 Pain in right toe(s): Secondary | ICD-10-CM

## 2021-01-17 DIAGNOSIS — B351 Tinea unguium: Secondary | ICD-10-CM | POA: Diagnosis not present

## 2021-01-17 DIAGNOSIS — E119 Type 2 diabetes mellitus without complications: Secondary | ICD-10-CM | POA: Diagnosis not present

## 2021-01-17 DIAGNOSIS — Z794 Long term (current) use of insulin: Secondary | ICD-10-CM

## 2021-01-17 IMAGING — MR MR KNEE*L* W/O CM
5 of 7 series · 24 of 40 positions shown · non-contrast
Comparison: Radiographs 05/07/2019

CLINICAL DATA: Knee pain for 3 weeks.

EXAM:
MRI OF THE LEFT KNEE WITHOUT CONTRAST
TECHNIQUE: Multiplanar, multisequence MR imaging of the knee was performed. No
intravenous contrast was administered.

[Series 3: T2 fat-sat · axial · 4.0mm · 0.50mm/px · z∈[-25,+89]mm · 5 of 24 slices shown (1 of 2)]
[im 1/24]
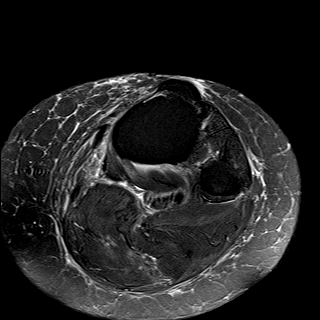
[im 6/24]
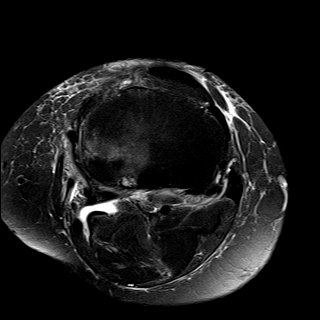
[im 12/24]
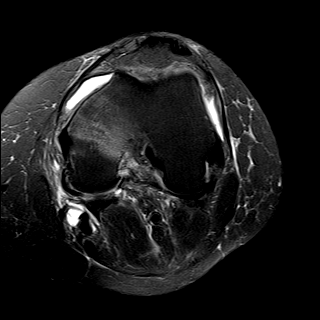
[im 18/24]
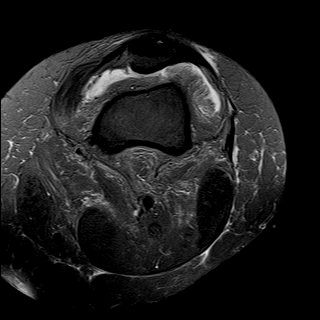
[im 24/24]
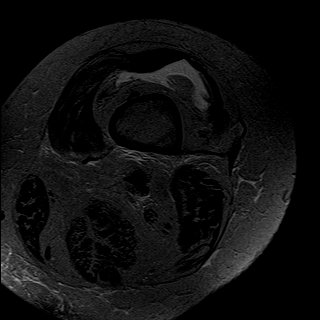

[Series 5: T2 fat-sat · coronal · 4.0mm · 0.29mm/px · 3 of 22 slices shown (2 of 2)]
[im 1/22]
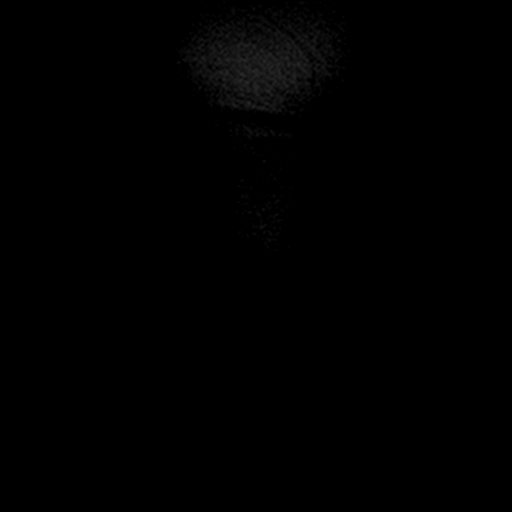
[im 5/22]
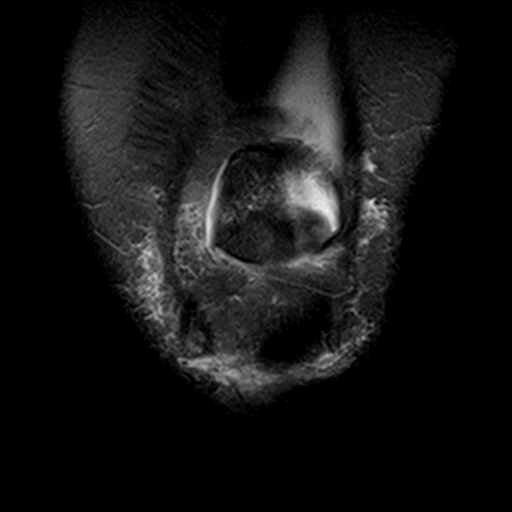
[im 9/22]
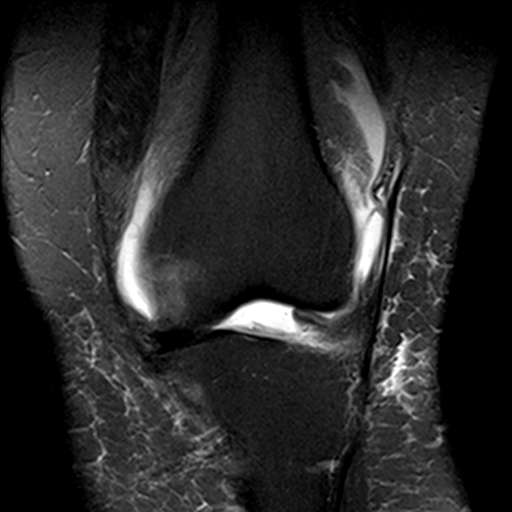

[Series 6: PD fat-sat · coronal · 4.0mm · 0.39mm/px · 6 of 22 slices shown (1 of 3)]
[im 1/22]
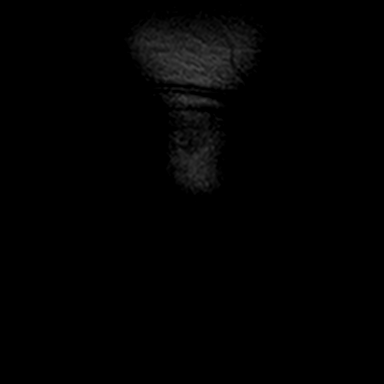
[im 5/22]
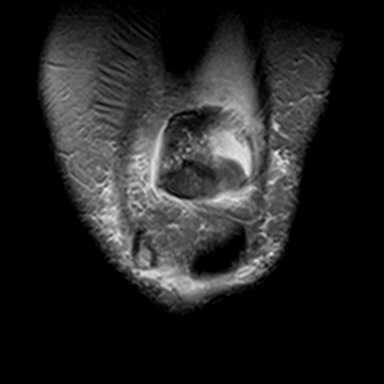
[im 9/22]
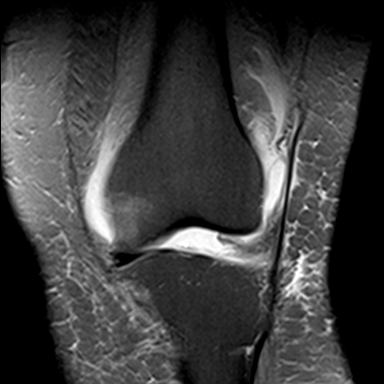
[im 13/22]
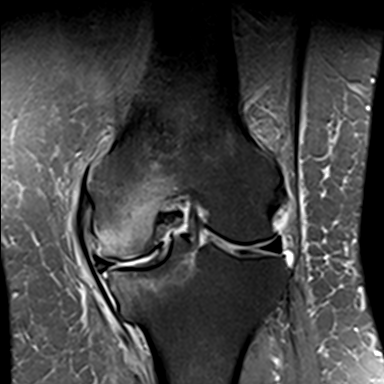
[im 17/22]
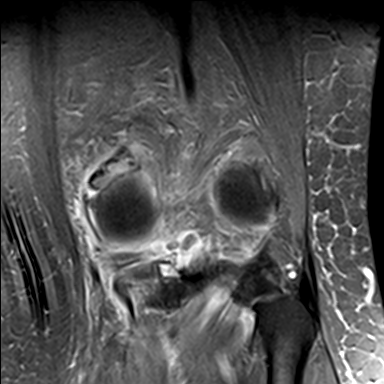
[im 22/22]
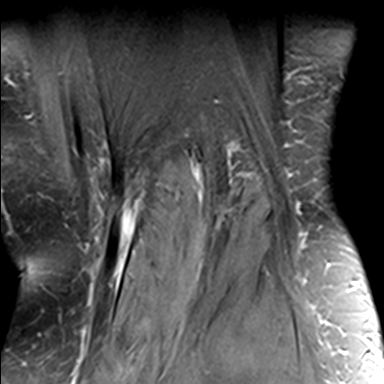

[Series 8: PD fat-sat · sagittal · 3.0mm · 0.29mm/px · 7 of 27 slices shown (2 of 3)]
[im 1/27]
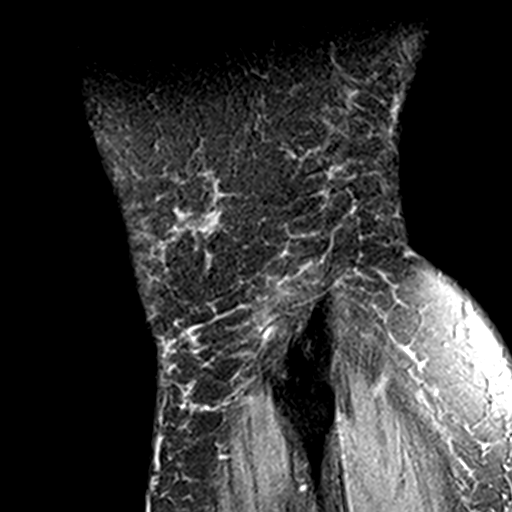
[im 5/27]
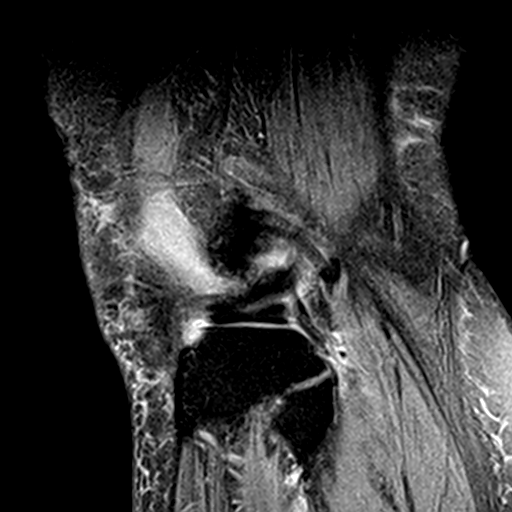
[im 9/27]
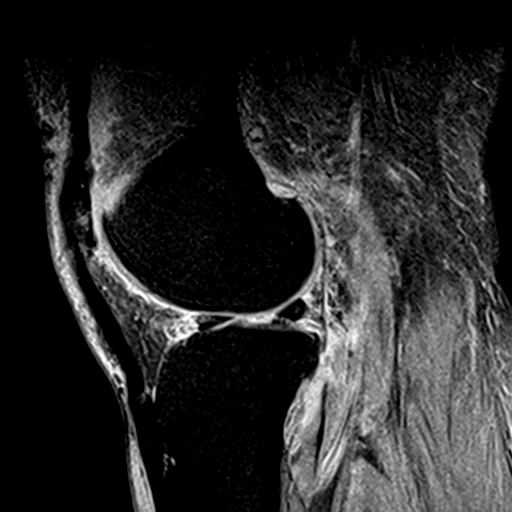
[im 14/27]
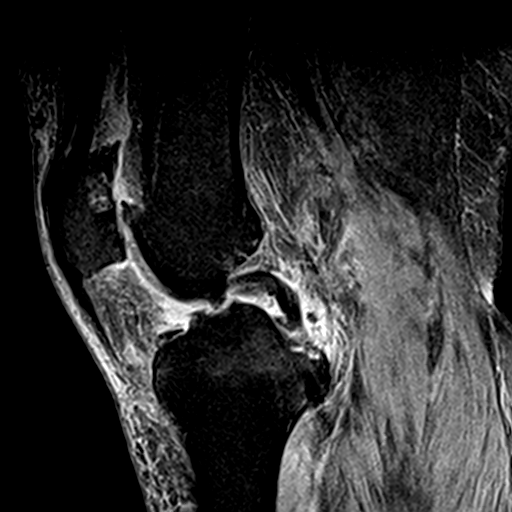
[im 18/27]
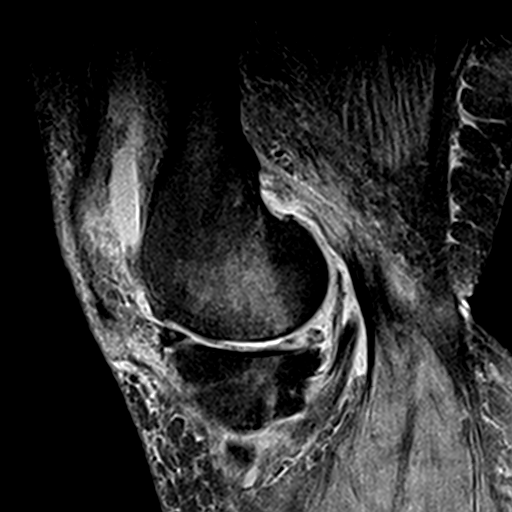
[im 22/27]
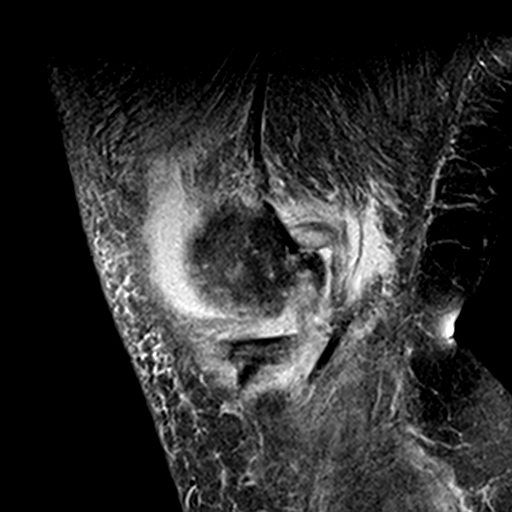
[im 27/27]
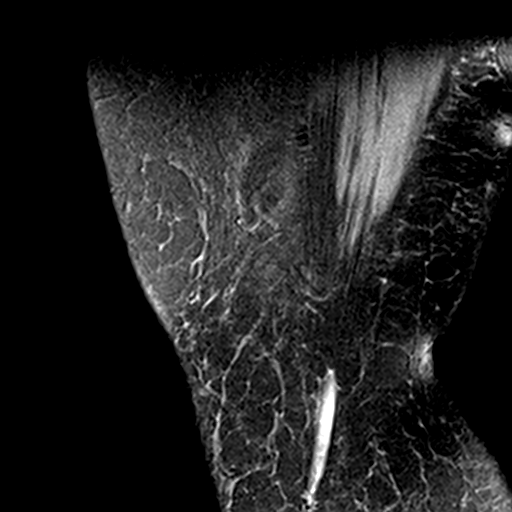

[Series 9: PD fat-sat · oblique · 2.0mm · 0.29mm/px · 3 of 11 slices shown (3 of 3)]
[im 1/11]
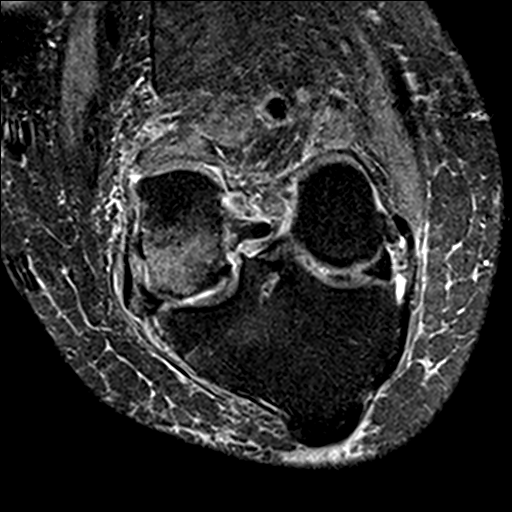
[im 6/11]
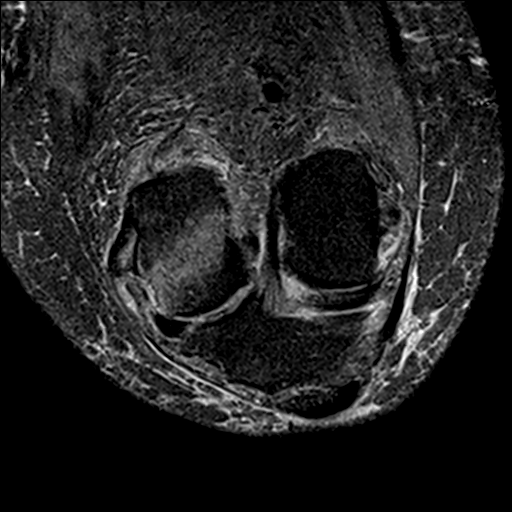
[im 11/11]
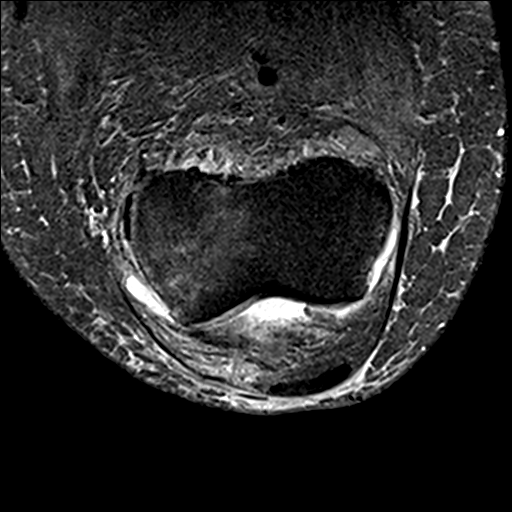

[24 of 40 positions shown; findings below may reference images not displayed]

FINDINGS: MENISCI

Medial meniscus: There is a large radial tear involving the
posterior horn of the medial meniscus measuring approximately 8 mm.
There is associated medial protrusion of the meniscus of 4 mm. The
meniscus demonstrates increased T2 signal intensity consistent with
associated mucinous degeneration.

Lateral meniscus:  Intact

LIGAMENTS

Cruciates:  Intact

Collaterals:  Intact. MCL and pes anserine bursitis noted.

CARTILAGE

Patellofemoral: Advanced degenerative chondrosis with areas of
full-thickness cartilage loss, joint space narrowing and spurring.

Medial: Advanced degenerative chondrosis with areas of
full-thickness cartilage loss. There is joint space narrowing and
spurring.

Lateral:  Moderate degenerative chondrosis with early spurring.

Joint:  Moderate-sized joint effusion and mild synovitis.

Popliteal Fossa:  Small to moderate-sized Baker's cyst.

Extensor Mechanism: The patella retinacular structures are intact
and the quadriceps and patellar tendons are intact.

Bones: Marked marrow edema involving the medial femoral condyle with
a small subchondral stress fracture noted. There is also reactive
marrow edema in the medial tibia.

Other: Normal knee musculature.
IMPRESSION: 1. Large radial tear involving the posterior horn of the medial
meniscus with associated medial protrusion of the dysfunctional
meniscus.
2. Small subchondral stress fracture involving the medial femoral
condyle with significant surrounding marrow edema. There is also a
moderate stress reaction in the tibia.
3. Intact ligamentous structures.
4. Tricompartmental degenerative changes.
5. Moderate-sized joint effusion and small to moderate-sized Baker's
cyst.

## 2021-01-18 ENCOUNTER — Other Ambulatory Visit: Payer: Self-pay

## 2021-01-18 ENCOUNTER — Other Ambulatory Visit (INDEPENDENT_AMBULATORY_CARE_PROVIDER_SITE_OTHER): Payer: Medicare Other

## 2021-01-18 DIAGNOSIS — E063 Autoimmune thyroiditis: Secondary | ICD-10-CM | POA: Diagnosis not present

## 2021-01-18 DIAGNOSIS — E1165 Type 2 diabetes mellitus with hyperglycemia: Secondary | ICD-10-CM | POA: Diagnosis not present

## 2021-01-18 DIAGNOSIS — Z794 Long term (current) use of insulin: Secondary | ICD-10-CM

## 2021-01-18 LAB — BASIC METABOLIC PANEL
BUN: 14 mg/dL (ref 6–23)
CO2: 28 mEq/L (ref 19–32)
Calcium: 9.3 mg/dL (ref 8.4–10.5)
Chloride: 103 mEq/L (ref 96–112)
Creatinine, Ser: 0.79 mg/dL (ref 0.40–1.20)
GFR: 74.22 mL/min (ref >60.00)
Glucose, Bld: 152 mg/dL — ABNORMAL HIGH (ref 70–99)
Potassium: 4.3 mEq/L (ref 3.5–5.1)
Sodium: 139 mEq/L (ref 135–145)

## 2021-01-18 LAB — LDL CHOLESTEROL, DIRECT: Direct LDL: 73 mg/dL

## 2021-01-18 LAB — HEMOGLOBIN A1C: Hgb A1c MFr Bld: 6.5 % (ref 4.6–6.5)

## 2021-01-18 LAB — TSH: TSH: 2.75 u[IU]/mL (ref 0.35–5.50)

## 2021-01-18 LAB — T4, FREE: Free T4: 1.01 ng/dL (ref 0.60–1.60)

## 2021-01-19 ENCOUNTER — Ambulatory Visit (INDEPENDENT_AMBULATORY_CARE_PROVIDER_SITE_OTHER): Payer: Medicare Other | Admitting: Endocrinology

## 2021-01-19 ENCOUNTER — Encounter: Payer: Self-pay | Admitting: Endocrinology

## 2021-01-19 VITALS — BP 140/82 | HR 87 | Ht 65.0 in | Wt 238.8 lb

## 2021-01-19 DIAGNOSIS — E1165 Type 2 diabetes mellitus with hyperglycemia: Secondary | ICD-10-CM | POA: Diagnosis not present

## 2021-01-19 DIAGNOSIS — I10 Essential (primary) hypertension: Secondary | ICD-10-CM | POA: Diagnosis not present

## 2021-01-19 DIAGNOSIS — E063 Autoimmune thyroiditis: Secondary | ICD-10-CM | POA: Diagnosis not present

## 2021-01-19 DIAGNOSIS — Z794 Long term (current) use of insulin: Secondary | ICD-10-CM

## 2021-01-19 DIAGNOSIS — E1129 Type 2 diabetes mellitus with other diabetic kidney complication: Secondary | ICD-10-CM | POA: Diagnosis not present

## 2021-01-19 DIAGNOSIS — R809 Proteinuria, unspecified: Secondary | ICD-10-CM

## 2021-01-19 MED ORDER — DAPAGLIFLOZIN PROPANEDIOL 10 MG PO TABS: 10.0000 mg | ORAL_TABLET | Freq: Every day | ORAL | 1 refills | Status: DC

## 2021-01-19 MED ORDER — DAPAGLIFLOZIN PROPANEDIOL 5 MG PO TABS: 5.0000 mg | ORAL_TABLET | Freq: Every day | ORAL | 3 refills | Status: DC

## 2021-01-19 NOTE — Patient Instructions (Signed)
Take Novolog before starting to eating  Check blood sugars on waking up daily  Also check blood sugars about 2 hours after meals and do this after different meals by rotation  Recommended blood sugar levels on waking up are 90-130 and about 2 hours after meal is 130-160  Please bring your blood sugar monitor to each visit, thank you

## 2021-01-19 NOTE — Progress Notes (Signed)
Patient ID: Marissa Roberts, female   DOB: 19-Oct-1945, 75 y.o.   MRN: [id number]               Reason for Appointment:  Follow-up for diabetes and hypertension   History of Present Illness:             Type 2 diabetes mellitus, date of diagnosis: 2005       INSULIN regimen is  Tresiba 34 units am, Novolog 10-16 units bid- tid ac  Oral hypoglycemic drugs the patient is taking are: Metformin 2 g daily, Farxiga 5 mg daily   Current blood sugar patterns, management and problems identified:  Her A1c is the same at 6.5   She has not been able to get her prescription for Farxiga 5 mg refilled and unclear why, her pharmacy claims they were requesting a refill from [place] Blood sugars are mildly increased overall compared to last time but A1c has not gone up She is now able to use the [facility] sensor with the help of her daughter  She does periodically miss her insulin doses at mealtimes and this will cause some significantly high readings especially after lunch  Also if she does not appear to be understanding how to adjust her NovoLog based on what she is eating and mostly taking 14 to 16 units  Overnight blood sugars are mildly increased especially early morning before down  No hypoglycemia  Weight has gone up a little She has taken her Tyler Aas consistently at the same dose Because of musculoskeletal reasons she is not able to do much exercise     Dinner usually at 6-7pm  Side effects from medications have been:none  Analysis of the [facility] G6 download for the last 2 weeks as follows  Her Dexcom blood sugar was very similar to her lab glucose done at the same time as verified on her daily graphs  her blood sugars are overall generally well controlled and average blood sugar within the target range at all times HIGHEST blood sugars on an average are after lunch around 2-4 PM Lowest blood sugars are around midnight Overnight blood sugars are generally rising gradually between midnight  until 8 AM and generally averaging between 123 and 148 overnight HYPOGLYCEMIA has been minimal with lowest blood sugar in the 70s only POSTPRANDIAL blood sugars are variably high after lunch and dinner with periodically going up especially after lunch Premeal readings are mildly increased before all the meals around 146-150  CGM use % of time 57  2-week average/GV 149/35  Time in range    84    %  % Time Above 180 16  % Time above 250   % Time Below 70 0     PRE-MEAL Fasting Lunch Dinner Bedtime Overall  Glucose range:       Averages: 141       POST-MEAL PC Breakfast PC Lunch PC Dinner  Glucose range:     Averages: 161 192 158       PRE-MEAL Fasting Lunch Dinner Bedtime Overall  Glucose range: 92-148  111-248  51-248  Mean/median: 111  142  115   POST-MEAL PC Breakfast PC Lunch PC Dinner  Glucose range:   51-134  Mean/median:   85    Self-care:   Meals: 3 meals per day. Breakfast is usually grits with or without eggs or meat Not always restricting fat intake or sweet tea  when eating out        Exercise:  Minimal,  she has knee pain  Dietician visit, most recent:2014.               Weight history:  Wt Readings from Last 3 Encounters:  01/19/21 238 lb 12.8 oz (108.3 kg)  01/09/21 236 lb (107 kg)  10/31/20 234 lb 12.8 oz (106.5 kg)    Glycemic control:   Lab Results  Component Value Date   HGBA1C 6.5 01/18/2021   HGBA1C 6.5 10/24/2020   HGBA1C 6.4 07/17/2020   Lab Results  Component Value Date   MICROALBUR 10.2 (H) 07/17/2020   LDLCALC 61 08/24/2019   CREATININE 0.79 01/18/2021   No results found for: FRUCTOSAMINE    Past history:  She thinks her blood sugars were only mildly increased at borderline levels at the onsetper Most likely she was treated with metformin initially and this has been continued.   At some point she was also tried on [place] and [place] for improving her control. She is not sure why she was started on insulin 5 years ago,  presumably for worsening hyperglycemia Also not clear if she has tried various insulin regimens before starting Lantus and NovoLog  Previously had tried the V-go pump but subsequently she stopped doing the V-go pump because of the cost, skin irritation and local discomfort   Allergies as of 01/19/2021       Reactions   Contrast Media [iodinated Diagnostic Agents]    Throat swells from ct contra   Adhesive [tape]    No paper tape, causes blisters   Erythromycin Rash   Penicillins Rash   Did it involve swelling of the face/tongue/throat, SOB, or low BP? No Did it involve sudden or severe rash/hives, skin peeling, or any reaction on the inside of your mouth or nose? No Did you need to seek medical attention at a hospital or doctor's office? No When did it last happen?    Many years If all above answers are "NO", may proceed with cephalosporin use.        Medication List        Accurate as of January 19, 2021 10:52 AM. If you have any questions, ask your nurse or doctor.          amLODipine 10 MG tablet Commonly known as: NORVASC Take 1 tablet (10 mg total) by mouth daily.   BD Pen Needle Nano U/F 32G X 4 MM Misc Generic drug: Insulin Pen Needle USE  4 TIMES DAILY TO  INJECT  INSULIN   Biotin 10000 MCG Tabs Take 10,000 mcg by mouth daily.   CALCIUM 600 + D PO Take 2 tablets by mouth daily.   cephALEXin 500 MG capsule Commonly known as: KEFLEX TAKE 4 CAPSULES (2,000 MG) 30-60 MINUTES PRIOR TO DENTAL PROCEDURE   clotrimazole 1 % cream Commonly known as: LOTRIMIN Apply to both feet and between toes twice daily for 6 weeks.   dapagliflozin propanediol 5 MG Tabs tablet Commonly known as: Farxiga Take 1 tablet (5 mg total) by mouth daily.   Dexcom G6 Receiver Devi For sensor monitoring   Dexcom G6 Sensor Misc Use to monitor blood sugar, change after 10 days   Dexcom G6 Transmitter Misc 4 PER YEAR   diclofenac Sodium 1 % Gel Commonly known as:  VOLTAREN Apply 2-4 grams to affected joint 4 times daily as needed.   fluticasone 50 MCG/ACT nasal spray Commonly known as: FLONASE Place 2 sprays into both nostrils 2 (two) times daily as needed for allergies or rhinitis.  furosemide 40 MG tablet Commonly known as: LASIX Take 2 tablets by mouth once daily   GLUCOSAMINE-MSM PO Take 1 tablet by mouth daily.   Humira Pen 40 MG/0.4ML Pnkt Generic drug: Adalimumab INJECT 1 PEN UNDER THE SKIN EVERY 14 DAYS.   latanoprost 0.005 % ophthalmic solution Commonly known as: XALATAN SMARTSIG:1 Drop(s) In Eye(s) Every Evening   leflunomide 20 MG tablet Commonly known as: ARAVA Take 1 tablet by mouth once daily   levothyroxine 112 MCG tablet Commonly known as: Synthroid Take 1 tablet (112 mcg total) by mouth daily before breakfast.   linaclotide 290 MCG Caps capsule Commonly known as: LINZESS Take 1 capsule (290 mcg total) by mouth daily as needed. What changed: reasons to take this   metFORMIN 1000 MG tablet Commonly known as: GLUCOPHAGE TAKE 1 TABLET BY MOUTH TWICE DAILY WITH A MEAL   methocarbamol 500 MG tablet Commonly known as: Robaxin Take 1 tablet (500 mg total) by mouth every 8 (eight) hours as needed for muscle spasms.   montelukast 10 MG tablet Commonly known as: SINGULAIR TAKE 1 TABLET BY MOUTH AT BEDTIME   multivitamin with minerals Tabs tablet Take 1 tablet by mouth daily.   NovoLOG FlexPen 100 UNIT/ML FlexPen Generic drug: insulin aspart INJECT 10-15 UNITS SUBCUTANEOUSLY THREE TIMES DAILY BEFORE MEAL(S)   omeprazole 40 MG capsule Commonly known as: PRILOSEC Take 40 mg by mouth daily.   OneTouch Delica Plus CLEXNT70Y Misc USE TO CHECK BLOOD SUGAR UP TO THREE TIMES A DAY 2 HOURS AFTER A MEAL AND UP TO 3 TIMES A WEEK UPON WAKING UP   OneTouch Verio test strip Generic drug: glucose blood CHECK BLOOD SUGAR 4 TIMES DAILY   potassium chloride 10 MEQ tablet Commonly known as: KLOR-CON Take 1 tablet (10 mEq  total) by mouth daily.   rosuvastatin 40 MG tablet Commonly known as: CRESTOR Take 1 tablet by mouth once daily   traMADol 50 MG tablet Commonly known as: Ultram Take 1 tablet (50 mg total) by mouth daily as needed.   Tyler Aas FlexTouch 200 UNIT/ML FlexTouch Pen Generic drug: insulin degludec INJECT 36 UNITS SUBCUTANEOUSLY IN THE MORNING   valsartan-hydrochlorothiazide 320-12.5 MG tablet Commonly known as: DIOVAN-HCT Take 1 tablet by mouth once daily        Allergies:  Allergies  Allergen Reactions   Contrast Media [Iodinated Diagnostic Agents]     Throat swells from ct contra   Adhesive [Tape]     No paper tape, causes blisters   Erythromycin Rash   Penicillins Rash    Did it involve swelling of the face/tongue/throat, SOB, or low BP? No Did it involve sudden or severe rash/hives, skin peeling, or any reaction on the inside of your mouth or nose? No Did you need to seek medical attention at a hospital or doctor's office? No When did it last happen?    Many years If all above answers are "NO", may proceed with cephalosporin use.     Past Medical History:  Diagnosis Date   Anemia    Arthritis    Bronchitis    hx of   Diabetes mellitus    type 2   Family history of adverse reaction to anesthesia    per patient, "daughter threw up after a knee surgery"   GERD (gastroesophageal reflux disease)    Hypercholesteremia    Hypertension    Hypothyroidism    Pneumonia    hx of   PONV (postoperative nausea and vomiting)    Thyroid disease  Past Surgical History:  Procedure Laterality Date   ABDOMINAL HYSTERECTOMY     partial   BREAST SURGERY     lumpectomy left breast   carpel tunnel     COLONOSCOPY  07/2020   COLONOSCOPY W/ POLYPECTOMY     FOOT SURGERY     knee arhtroscopy     KNEE ARTHROPLASTY Left 05/2020   ORIF PATELLA Left 05/23/2020   Procedure: LEFT KNEE POLE PATELLA EXCISION;  Surgeon: [name], MD;  Location: [facility];  Service:  Orthopedics;  Laterality: Left;   SHOULDER ARTHROSCOPY WITH SUBACROMIAL DECOMPRESSION, ROTATOR CUFF REPAIR AND BICEP TENDON REPAIR Right 05/26/2012   Procedure: Right Shoulder Diagnostic Operative Arthroscopy, Subacromial Decompression, Biceps Tenodesis, Mini Open Rotator Cuff Repair;  Surgeon: [name], MD;  Location: [place] [facility];  Service: Orthopedics;  Laterality: Right;  Right Shoulder Diagnostic Operative Arthroscopy, Subacromial Decompression, Biceps Tenodesis, Mini Open Rotator Cuff Repair   TONSILLECTOMY     TOTAL KNEE ARTHROPLASTY Left 07/01/2019   Procedure: LEFT TOTAL KNEE ARTHROPLASTY;  Surgeon: [name], MD;  Location: [facility];  Service: Orthopedics;  Laterality: Left;   UPPER GI ENDOSCOPY  07/2020    Family History  Problem Relation Age of Onset   Diabetes Mother    Heart disease Mother    Hyperlipidemia Mother    Diabetes Father    Diabetes Sister    Hypertension Sister    Heart disease Sister    Dementia Sister    Heart disease Brother    Healthy Daughter    Healthy Daughter    Healthy Daughter    Healthy Son     Social History:  reports that she quit smoking about 30 years ago. Her smoking use included cigarettes. She has never used smokeless tobacco. She reports that she does not currently use alcohol. She reports that she does not use drugs.    Review of Systems   HYPERTENSION: She is on amlodipine 5 mg once daily and Diovan HCT 320/12.5  Was tried on diltiazem instead of amlodipine before but she felt that this was causing hair loss and constipation   She is checking blood pressure at home, he thinks systolic reading is usually around 120.  BP Readings from Last 3 Encounters:  01/19/21 140/82  01/09/21 140/60  10/31/20 130/72    On Lasix 40 mg for edema  NEPHROPATHY: She has mild persistently high microalbumin  LIPIDS:  She is taking rosuvastatin 40 mg, previously LDL was high with simvastatin 20 mg LDL below 100 Crestor prescribed by  PCP      Lab Results  Component Value Date   CHOL 133 08/24/2019   HDL 59.70 08/24/2019   LDLCALC 61 08/24/2019   LDLDIRECT 73.0 01/18/2021   TRIG 64.0 08/24/2019   CHOLHDL 2 08/24/2019                  Thyroid:      She has been hypothyroid for over 40 years Previously taking a supplement of 137 g levothyroxine, 6 tablets per week and more recently is taking 112 mcg daily She takes levothyroxine before breakfast consistently No change in her energy level lately  TSH has come back to normal with switching to 112 mcg   Last labs:   Lab Results  Component Value Date   TSH 2.75 01/18/2021   TSH 0.32 (L) 07/17/2020   TSH 2.03 02/29/2020   FREET4 1.01 01/18/2021   FREET4 1.58 08/24/2019   FREET4 1.11 07/21/2018  She is followed periodically by podiatrist  Physical Examination:  BP 140/82   Pulse 87   Ht 5\' 5"  (1.651 m)   Wt 238 lb 12.8 oz (108.3 kg)   SpO2 96%   BMI 39.74 kg/m   Trace lower leg edema present   ASSESSMENT/PLAN:  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:  See history of present illness for detailed discussion of current diabetes management, blood sugar patterns and problems identified  She is on basal bolus insulin, Farxiga 5 mg and metformin  A1c is 6.5 as before  She has some rise in her blood sugars overall especially some meals and was doing better when she was taking the [place] For some reason she has not had refill available for [place] She is also concerned about the cost of this  With stopping [place] her weight and blood pressure appears to be relatively higher also Variability in her blood sugars in the evenings but no consistent pattern Has occasionally missed her mealtime doses  Again some high blood sugars are usually related to underestimating dosage for higher carbohydrate meals   Recommendations:  Continue same doses of insulin However she can adjust her mealtime dose between 12 to 16 units based on how much she is eating as  well as how her blood sugar responds later after the meal with the help of her CGM She we will try to continue using the Dexcom as long as it is covered  New prescription for [place] given, she can try taking a half of the 10 mg dose  Call if having consistently high fasting readings  Also if she starts getting any low sugars with adding [place] she will let us know  Consistently needs to be moderating calories and avoiding sweet tea as much as possible  HYPERTENSION with microalbuminuria: Blood pressure is relatively higher and may be related to not taking [place]  Will need follow-up microalbumin on the next visit  HYPOTHYROIDISM: She has better TSH level with 112 mcg daily and she will continue this   Needs follow-up lipids from her PCP  There are no Patient Instructions on file for this visit.   Elayne Snare 01/19/2021, 10:52 AM   Note: This office note was prepared with Dragon voice recognition system technology. Any transcriptional errors that result from this process are unintentional.

## 2021-01-23 NOTE — Progress Notes (Signed)
  Subjective:  Patient ID: Marissa Roberts, female    DOB: 11-29-1945,  MRN: [id number]  75 y.o. female presents corn(s) of both feet and painful thick toenails that are difficult to trim. Painful toenails interfere with ambulation. Aggravating factors include wearing enclosed shoe gear. Pain is relieved with periodic professional debridement. Painful corns are aggravated when weightbearing when wearing enclosed shoe gear. Pain is relieved with periodic professional debridement.  Patient states blood glucose was 139 mg/dl today.   PCP is [name], MD , and last visit was 09/25/2020.  She also sees Dr. Elayne Snare for her diabetes and last visit was 01/19/2021 .  Allergies  Allergen Reactions   Contrast Media [Iodinated Diagnostic Agents]     Throat swells from ct contra   Adhesive [Tape]     No paper tape, causes blisters   Erythromycin Rash   Penicillins Rash    Did it involve swelling of the face/tongue/throat, SOB, or low BP? No Did it involve sudden or severe rash/hives, skin peeling, or any reaction on the inside of your mouth or nose? No Did you need to seek medical attention at a hospital or doctor's office? No When did it last happen?    Many years If all above answers are "NO", may proceed with cephalosporin use.     Review of Systems: Negative except as noted in the HPI.   Objective:  Vascular Examination: Capillary fill time to digits <3 seconds b/l lower extremities. Intact b/l with palpable pedal pulses. No edema. No pain with calf compression b/l. Skin temperature gradient WNL b/l.   Neurological Examination: Sensation grossly intact b/l with 10 gram monofilament. Vibratory sensation intact b/l.   Dermatological Examination: Skin warm and supple b/l lower extremities. Hyperkeratotic lesion(s) L hallux, L 5th toe, R hallux, and R 5th toe.  No erythema, no edema, no drainage, no fluctuance. Toenails 1-5 b/l thick, discolored, elongated with subungual debris  and pain on dorsal palpation.   Musculoskeletal Examination: Normal muscle strength 5/5 to all lower extremity muscle groups bilaterally. Hammertoe(s) noted to the L 5th toe and R 5th toe.  Radiographs: None Assessment:   1. Pain due to onychomycosis of toenails of both feet   2. Corns and callus   3. Type 2 diabetes mellitus without complication, with long-term current use of insulin ([place])    Plan:  -Continue diabetic foot care principles: inspect feet daily, monitor glucose as recommended by PCP and/or Endocrinologist, and follow prescribed diet per PCP, Endocrinologist and/or dietician. -Mycotic toenails were debrided in length and girth with sterile nail nippers and dremel without iatrogenic bleeding. -Hyperkeratotic lesions L hallux, L 5th toe, R hallux, and R 5th toe pared utilizing sterile scalpel blade without complication or incident. Total number debrided=4. -Patient to report any pedal injuries to medical professional immediately. -Patient/POA to call should there be question/concern in the interim.  Return in about 3 months (around 04/19/2021).  Marzetta Board, DPM

## 2021-01-24 ENCOUNTER — Ambulatory Visit (INDEPENDENT_AMBULATORY_CARE_PROVIDER_SITE_OTHER): Payer: Medicare Other | Admitting: Orthopedic Surgery

## 2021-01-24 ENCOUNTER — Other Ambulatory Visit: Payer: Self-pay

## 2021-01-24 ENCOUNTER — Ambulatory Visit: Payer: Medicare Other | Admitting: Orthopedic Surgery

## 2021-01-24 DIAGNOSIS — M25562 Pain in left knee: Secondary | ICD-10-CM

## 2021-01-24 DIAGNOSIS — G8929 Other chronic pain: Secondary | ICD-10-CM

## 2021-01-25 ENCOUNTER — Other Ambulatory Visit: Payer: Medicare Other

## 2021-01-26 ENCOUNTER — Ambulatory Visit: Payer: Medicare Other | Admitting: Dietician

## 2021-01-26 ENCOUNTER — Other Ambulatory Visit: Payer: Self-pay

## 2021-01-26 ENCOUNTER — Encounter: Payer: Self-pay | Admitting: Dietician

## 2021-01-26 NOTE — Progress Notes (Signed)
Patient is here today alone.  She was here for [facility] training about 10 days ago.  She started her new dexcom this morning (30 minutes before it expired) and did not stop the old sensor.  When she applied the new sensor, it did not function despite calling tech support at [facility].  Old Sensor stopped. Patient applied a new sensor and it is in warm up.  Dexcom Clarity report downloaded.   Average glucose 141 and time in ranch 88%.  Patient asked about ordering Dexcom Supplies. Called [facility] [employer] with patient present and new order was sent to insurance.  Patient to call for further questions.  Antonieta Iba, RD, LDN, CDCES

## 2021-01-27 ENCOUNTER — Encounter: Payer: Self-pay | Admitting: Orthopedic Surgery

## 2021-01-27 DIAGNOSIS — E119 Type 2 diabetes mellitus without complications: Secondary | ICD-10-CM | POA: Diagnosis not present

## 2021-01-27 NOTE — Progress Notes (Signed)
Office Visit Note   Patient: Marissa Roberts           Date of Birth: 1945/12/14           MRN: [id number] Visit Date: 01/24/2021 Requested by: [name], MD [address] [place],  [place] [address] PCP: [name], MD  Subjective: Chief Complaint  Patient presents with   Left Knee - Follow-up    HPI: [name] is a patient with left knee pain.  She had left total knee replacement done 07/01/2019.  She did well for months after that surgery until she developed inferior pole patellar pain.  She was noted to have a small ossicle off the inferior pole of the patella.  After lengthy course of nonoperative treatment the inferior pole patella was excised and she did well for about 1 to 2 months after recovering from surgery.  Now she reports lateral sided knee pain.  She has had a CT scan of the knee which is unremarkable as well as a bone scan which shows questionable increased uptake on the medial side of the tibial plateau where she is having no symptoms.  She states she cannot live with this pain.  She did have a lateral sided diagnostic injection by Dr. Ninfa Linden several weeks ago as part of a second opinion.  I did improve her pain temporarily.              ROS: All systems reviewed are negative as they relate to the chief complaint within the history of present illness.  Patient denies  fevers or chills.   Assessment & Plan: Visit Diagnoses:  1. Chronic pain of left knee     Plan: Impression is persistent left-sided knee pain.  Difficult to know exactly what is causing the pain.  Could be iliotibial band bursitis versus synovitis on that lateral side of the knee.  I do not think there is a structural problem with the knee itself in terms of loosening in their is no evidence of infection.  Stability of the knee is very good in full extension mid flexion and 90 degrees of flexion.  No real tenderness over the inferior pole of the patella.  I think the most aggressive  thing that could be done at this time would be arthroscopy and debridement on that lateral side of the tibial plateau.  That may or may not help her and would expose her to the risk of an infection again.  I think [name] wants a clear-cut answer as to why she is having pain on the lateral side of her knee and I do not think there is one at this time.  She would like to have an intervention; however, with any intervention there is accompanying risk and at this time with no clear-cut explanation as to the source of the lateral sided pain I am not sure that the small potential benefit of intervention outweighs the significant risk of creating a bigger problem than what she has right now.  She is not willing to live with the current pain in her knee which is understandable.  Revision in this clinical scenario is only associated with 50% improvement.  Again I am not sure this is really registering with [name] in terms of the risk of intervention for an unclear problem weighed against the benefit of that intervention.  She may seek another opinion outside of our group which is understandable. Follow-Up Instructions: Return if symptoms worsen or fail to improve.  Orders:  No orders of the defined types were placed in this encounter.  No orders of the defined types were placed in this encounter.     Procedures: No procedures performed   Clinical Data: No additional findings.  Objective: Vital Signs: There were no vitals taken for this visit.  Physical Exam:   Constitutional: Patient appears well-developed HEENT:  Head: Normocephalic Eyes:EOM are normal Neck: Normal range of motion Cardiovascular: Normal rate Pulmonary/chest: Effort normal Neurologic: Patient is alert Skin: Skin is warm Psychiatric: Patient has normal mood and affect   Ortho Exam: Ortho exam demonstrates full active and passive range of motion of the hip and ankle.  The knee has good range of motion from full extension to  about 105 of flexion with good stability varus valgus stress at 0 30 and 90 degrees.  No tenderness inferior pole of the patella.  She does have some vague tenderness around the lateral aspect of the knee slightly more anterior than I would expect for classic iliotibial band bursitis.  No real mechanical symptoms on the lateral side of the knee.  No medial sided tenderness is present.  Pedal pulses palpable.  No groin pain with internal ex rotation of the leg.  Specialty Comments:  No specialty comments available.  Imaging: No results found.   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 organ or systems involvement ([place]) 01/25/2019   High risk medication use 01/25/2019   Fatigue 09/18/2018   Routine general medical examination at a health care facility 02/07/2015   Morbid obesity ([place]) 04/19/2013   Hyperlipidemia associated with type 2 diabetes mellitus ([place]) 07/28/2009   Osteopenia 03/14/2009   Diabetes mellitus type 2 with complications ([place]) 71/69/6789   Hypothyroidism 09/24/2006   Essential hypertension 09/24/2006   Past Medical History:  Diagnosis Date   Anemia    Arthritis    Bronchitis    hx of   Diabetes mellitus    type 2   Family history of adverse reaction to anesthesia    per patient, "daughter threw up after a knee surgery"   GERD (gastroesophageal reflux disease)    Hypercholesteremia    Hypertension    Hypothyroidism    Pneumonia    hx of   PONV (postoperative nausea and vomiting)     Thyroid disease     Family History  Problem Relation Age of Onset   Diabetes Mother    Heart disease Mother    Hyperlipidemia Mother    Diabetes Father    Diabetes Sister    Hypertension Sister    Heart disease Sister    Dementia Sister    Heart disease Brother    Healthy Daughter    Healthy Daughter    Healthy Daughter    Healthy Son     Past Surgical History:  Procedure Laterality Date   ABDOMINAL HYSTERECTOMY     partial   BREAST SURGERY     lumpectomy left breast   carpel tunnel     COLONOSCOPY  07/2020   COLONOSCOPY  W/ POLYPECTOMY     FOOT SURGERY     knee arhtroscopy     KNEE ARTHROPLASTY Left 05/2020   ORIF PATELLA Left 05/23/2020   Procedure: LEFT KNEE POLE PATELLA EXCISION;  Surgeon: [name], MD;  Location: [facility];  Service: Orthopedics;  Laterality: Left;   SHOULDER ARTHROSCOPY WITH SUBACROMIAL DECOMPRESSION, ROTATOR CUFF REPAIR AND BICEP TENDON REPAIR Right 05/26/2012   Procedure: Right Shoulder Diagnostic Operative Arthroscopy, Subacromial Decompression, Biceps Tenodesis, Mini Open Rotator Cuff Repair;  Surgeon: [name], MD;  Location: [facility];  Service: Orthopedics;  Laterality: Right;  Right Shoulder Diagnostic Operative Arthroscopy, Subacromial Decompression, Biceps Tenodesis, Mini Open Rotator Cuff Repair   TONSILLECTOMY     TOTAL KNEE ARTHROPLASTY Left 07/01/2019   Procedure: LEFT TOTAL KNEE ARTHROPLASTY;  Surgeon: [name], MD;  Location: [facility];  Service: Orthopedics;  Laterality: Left;   UPPER GI ENDOSCOPY  07/2020   Social History   Occupational History   Not on file  Tobacco Use   Smoking status: Former    Types: Cigarettes    Quit date: 04/01/1990    Years since quitting: 30.8   Smokeless tobacco: Never  Vaping Use   Vaping Use: Never used  Substance and Sexual Activity   Alcohol use: Not Currently    Comment: 06/22/2019: per patient about 20-25 years ago   Drug use: No   Sexual activity: Never    Birth  control/protection: Abstinence

## 2021-02-01 ENCOUNTER — Ambulatory Visit: Payer: Medicare Other | Admitting: Endocrinology

## 2021-02-05 ENCOUNTER — Other Ambulatory Visit: Payer: Self-pay | Admitting: Endocrinology

## 2021-02-05 DIAGNOSIS — M25562 Pain in left knee: Secondary | ICD-10-CM | POA: Diagnosis not present

## 2021-02-06 ENCOUNTER — Other Ambulatory Visit: Payer: Self-pay | Admitting: Physician Assistant

## 2021-02-06 NOTE — Telephone Encounter (Signed)
Next Visit: 02/26/2021  Last Visit: 09/18/2020  Last Fill: 01/09/2021 (30 day supply)  DX: Rheumatoid arthritis involving multiple sites with positive rheumatoid factor   Current Dose per office note 09/18/2020: Arava 20 mg 1 tablet by mouth daily.  Labs: 01/09/2021 Glucose is elevated-163. Rest of CMP WNL.  Absolute lymphocyte count is borderline elevated.  WBC count is WNL.  Rest of CBC WNL.    Okay to refill Arava?

## 2021-02-08 ENCOUNTER — Ambulatory Visit (INDEPENDENT_AMBULATORY_CARE_PROVIDER_SITE_OTHER): Payer: Medicare Other | Admitting: Physician Assistant

## 2021-02-08 ENCOUNTER — Encounter: Payer: Self-pay | Admitting: Physician Assistant

## 2021-02-08 ENCOUNTER — Other Ambulatory Visit: Payer: Self-pay

## 2021-02-08 ENCOUNTER — Telehealth: Payer: Self-pay

## 2021-02-08 VITALS — BP 131/71 | HR 76 | Resp 16 | Ht 65.0 in | Wt 236.0 lb

## 2021-02-08 DIAGNOSIS — M8580 Other specified disorders of bone density and structure, unspecified site: Secondary | ICD-10-CM

## 2021-02-08 DIAGNOSIS — E118 Type 2 diabetes mellitus with unspecified complications: Secondary | ICD-10-CM

## 2021-02-08 DIAGNOSIS — M19071 Primary osteoarthritis, right ankle and foot: Secondary | ICD-10-CM | POA: Diagnosis not present

## 2021-02-08 DIAGNOSIS — E1169 Type 2 diabetes mellitus with other specified complication: Secondary | ICD-10-CM

## 2021-02-08 DIAGNOSIS — Z79899 Other long term (current) drug therapy: Secondary | ICD-10-CM

## 2021-02-08 DIAGNOSIS — M19072 Primary osteoarthritis, left ankle and foot: Secondary | ICD-10-CM

## 2021-02-08 DIAGNOSIS — Z96652 Presence of left artificial knee joint: Secondary | ICD-10-CM | POA: Diagnosis not present

## 2021-02-08 DIAGNOSIS — Z8639 Personal history of other endocrine, nutritional and metabolic disease: Secondary | ICD-10-CM

## 2021-02-08 DIAGNOSIS — M0579 Rheumatoid arthritis with rheumatoid factor of multiple sites without organ or systems involvement: Secondary | ICD-10-CM | POA: Diagnosis not present

## 2021-02-08 DIAGNOSIS — R21 Rash and other nonspecific skin eruption: Secondary | ICD-10-CM

## 2021-02-08 DIAGNOSIS — E785 Hyperlipidemia, unspecified: Secondary | ICD-10-CM

## 2021-02-08 DIAGNOSIS — I10 Essential (primary) hypertension: Secondary | ICD-10-CM

## 2021-02-08 NOTE — Telephone Encounter (Signed)
Received PA renewal form from [employer]. Filled out form and faxed back with supporting documentation. Will await response.  Phone# [phone] Fax# [phone]

## 2021-02-08 NOTE — Addendum Note (Signed)
Addended by: Earnestine Mealing on: 02/08/2021 02:55 PM   Modules accepted: Orders

## 2021-02-08 NOTE — Patient Instructions (Signed)
Standing Labs We placed an order today for your standing lab work.   Please have your standing labs drawn in January and every 3 months   If possible, please have your labs drawn 2 weeks prior to your appointment so that the provider can discuss your results at your appointment.  Please note that you may see your imaging and lab results in MyChart before we have reviewed them. We may be awaiting multiple results to interpret others before contacting you. Please allow our office up to 72 hours to thoroughly review all of the results before contacting the office for clarification of your results.  We have open lab daily: Monday through Thursday from 1:30-4:30 PM and Friday from 1:30-4:00 PM at the office of Dr. Shaili Deveshwar, [facility].   Please be advised, all patients with office appointments requiring lab work will take precedent over walk-in lab work.  If possible, please come for your lab work on Monday and Friday afternoons, as you may experience shorter wait times. The office is located at [address], [address], [place], [place] [address] No appointment is necessary.   Labs are drawn by [facility]. Please bring your co-pay at the time of your lab draw.  You may receive a bill from [facility] for your lab work.  If you wish to have your labs drawn at another location, please call the office 24 hours in advance to send orders.  If you have any questions regarding directions or hours of operation,  please call [phone].   As a reminder, please drink plenty of water prior to coming for your lab work. Thanks!  

## 2021-02-08 NOTE — Progress Notes (Signed)
Office Visit Note  Patient: Marissa Roberts             Date of Birth: 03/12/1946           MRN: [id number]             PCP: [name], MD Referring: [name], * Visit Date: 02/08/2021 Occupation: @GUAROCC @  Subjective:  Left hand pain   History of Present Illness: Marissa Roberts is a 75 y.o. female with history of seropositive rheumatoid arthritis and osteoarthritis.  She is on humira 40 mg sq injections every 14 days and arava 20 mg daily.  She is tolerating both medications without any side effects or injection site reactions.  She has not missed any doses of Humira or Arava recently.  Patient reports that about 1 week ago she was preparing collards and woke up the next day with increased pain and inflammation in the left hand.  She continues to persistent swelling in the left middle finger but the rest of her joint swelling has subsided.  She is also having discomfort in the left shoulder joint which is exacerbated by range of motion exercises.  She has tried taking Tylenol as needed and using Voltaren gel topically as needed for pain relief.  She continues to have chronic pain in the left knee replacement.  She denies any other joint pain or joint swelling at this time.  She does not want to make any medication changes at this time.  Overall she continues to find combination therapy to be effective at managing her rheumatoid arthritis. She denies any recent infections.    Activities of Daily Living:  Patient reports joint stiffness all day  Patient Reports nocturnal pain.  Difficulty dressing/grooming: Denies Difficulty climbing stairs: Denies Difficulty getting out of chair: Reports Difficulty using hands for taps, buttons, cutlery, and/or writing: Reports  Review of Systems  Constitutional:  Positive for fatigue.  HENT:  Negative for mouth dryness.   Eyes:  Negative for dryness.  Respiratory:  Negative for shortness of breath.   Cardiovascular:   Positive for swelling in legs/feet.  Gastrointestinal:  Positive for constipation.  Endocrine: Negative for increased urination.  Genitourinary:  Negative for difficulty urinating.  Musculoskeletal:  Positive for joint pain, joint pain, joint swelling, muscle weakness and morning stiffness.  Skin:  Negative for rash.  Allergic/Immunologic: Negative for susceptible to infections.  Neurological:  Negative for numbness.  Hematological:  Negative for bruising/bleeding tendency.  Psychiatric/Behavioral:  Negative for sleep disturbance.    PMFS History:  Patient Active Problem List   Diagnosis Date Noted   Displaced transverse fracture of left patella, subsequent encounter for closed fracture with delayed healing    Arthritis of knee 05/23/2020   Total knee replacement status 04/20/2020   Pain in knee region after total knee replacement ([place]) 03/22/2020   Rash 10/11/2019   Arthritis of left knee 07/01/2019   Hypercholesterolemia 04/20/2019   B12 deficiency 03/23/2019   Anemia 02/05/2019   Rheumatoid arthritis with rheumatoid factor of multiple sites without organ or systems involvement ([place]) 01/25/2019   High risk medication use 01/25/2019   Fatigue 09/18/2018   Routine general medical examination at a health care facility 02/07/2015   Morbid obesity ([place]) 04/19/2013   Hyperlipidemia associated with type 2 diabetes mellitus ([place]) 07/28/2009   Osteopenia 03/14/2009   Diabetes mellitus type 2 with complications ([place]) 53/66/4403   Hypothyroidism 09/24/2006   Essential hypertension 09/24/2006    Past Medical History:  Diagnosis  Date   Anemia    Arthritis    Bronchitis    hx of   Diabetes mellitus    type 2   Family history of adverse reaction to anesthesia    per patient, "daughter threw up after a knee surgery"   GERD (gastroesophageal reflux disease)    Hypercholesteremia    Hypertension    Hypothyroidism    Pneumonia    hx of   PONV (postoperative nausea and vomiting)     Thyroid disease     Family History  Problem Relation Age of Onset   Diabetes Mother    Heart disease Mother    Hyperlipidemia Mother    Diabetes Father    Diabetes Sister    Hypertension Sister    Heart disease Sister    Dementia Sister    Heart disease Brother    Healthy Daughter    Healthy Daughter    Healthy Daughter    Healthy Son    Past Surgical History:  Procedure Laterality Date   ABDOMINAL HYSTERECTOMY     partial   BREAST SURGERY     lumpectomy left breast   carpel tunnel     COLONOSCOPY  07/2020   COLONOSCOPY W/ POLYPECTOMY     FOOT SURGERY     knee arhtroscopy     KNEE ARTHROPLASTY Left 05/2020   ORIF PATELLA Left 05/23/2020   Procedure: LEFT KNEE POLE PATELLA EXCISION;  Surgeon: [name], MD;  Location: [facility];  Service: Orthopedics;  Laterality: Left;   SHOULDER ARTHROSCOPY WITH SUBACROMIAL DECOMPRESSION, ROTATOR CUFF REPAIR AND BICEP TENDON REPAIR Right 05/26/2012   Procedure: Right Shoulder Diagnostic Operative Arthroscopy, Subacromial Decompression, Biceps Tenodesis, Mini Open Rotator Cuff Repair;  Surgeon: [name], MD;  Location: [facility];  Service: Orthopedics;  Laterality: Right;  Right Shoulder Diagnostic Operative Arthroscopy, Subacromial Decompression, Biceps Tenodesis, Mini Open Rotator Cuff Repair   TONSILLECTOMY     TOTAL KNEE ARTHROPLASTY Left 07/01/2019   Procedure: LEFT TOTAL KNEE ARTHROPLASTY;  Surgeon: [name], MD;  Location: [facility];  Service: Orthopedics;  Laterality: Left;   UPPER GI ENDOSCOPY  07/2020   Social History   Social History Narrative   Not on file   Immunization History  Administered Date(s) Administered   Fluad Quad(high Dose 65+) 01/02/2019, 12/31/2019   Influenza Split 01/03/2011   Influenza Whole 04/01/2005, 12/18/2009   Influenza, High Dose Seasonal PF 01/07/2014, 01/27/2015, 01/02/2016, 01/13/2017, 01/16/2018   Influenza-Unspecified 11/30/2012, 01/03/2021   PFIZER(Purple Top)SARS-COV-2  Vaccination 05/28/2019, 06/23/2019, 01/15/2020, 07/03/2020, 01/03/2021   Pneumococcal Conjugate-13 09/12/2016, 01/02/2019   Pneumococcal Polysaccharide-23 07/15/2014, 12/27/2019   Td 04/01/2001, 10/07/2013   Zoster Recombinat (Shingrix) 01/09/2021     Objective: Vital Signs: BP 131/71 (BP Location: Left Arm, Patient Position: Sitting, Cuff Size: Normal)   Pulse 76   Resp 16   Ht 5\' 5"  (1.651 m)   Wt 236 lb (107 kg)   BMI 39.27 kg/m    Physical Exam Vitals and nursing note reviewed.  Constitutional:      Appearance: She is well-developed.  HENT:     Head: Normocephalic and atraumatic.  Eyes:     Conjunctiva/sclera: Conjunctivae normal.  Pulmonary:     Effort: Pulmonary effort is normal.  Abdominal:     Palpations: Abdomen is soft.  Musculoskeletal:     Cervical back: Normal range of motion.  Skin:    General: Skin is warm and dry.     Capillary Refill: Capillary refill takes less  than 2 seconds.  Neurological:     Mental Status: She is alert and oriented to person, place, and time.  Psychiatric:        Behavior: Behavior normal.     Musculoskeletal Exam: C-spine has good ROM with no discomfort.  Painful ROM of the left shoulder joint.  Elbow joints have good ROM with no tenderness or joint swelling.  Limited extension of the left wrist with some tenderness.  Tenderness over the left 2nd-5th MCP joints.  Tenderness over the left 2nd and 3rd PIP joints.  Hip joints, knee joints, and ankle joints have good ROM with no discomfort.  Painful ROM of the left knee replacement.  Right knee joint has good ROM with no discomfort.  No tenderness or swelling of ankle joints.   CDAI Exam: CDAI Score: 1.2  Patient Global: 7 mm; Provider Global: 5 mm Swollen: 0 ; Tender: 0  Joint Exam 02/08/2021   No joint exam has been documented for this visit   There is currently no information documented on the homunculus. Go to the Rheumatology activity and complete the homunculus joint  exam.  Investigation: No additional findings.  Imaging: No results found.  Recent Labs: Lab Results  Component Value Date   WBC 10.0 01/09/2021   HGB 12.1 01/09/2021   PLT 225 01/09/2021   NA 139 01/18/2021   K 4.3 01/18/2021   CL 103 01/18/2021   CO2 28 01/18/2021   GLUCOSE 152 (H) 01/18/2021   BUN 14 01/18/2021   CREATININE 0.79 01/18/2021   BILITOT 0.3 01/09/2021   ALKPHOS 45 07/17/2020   AST 15 01/09/2021   ALT 15 01/09/2021   PROT 6.9 01/09/2021   ALBUMIN 4.0 07/17/2020   CALCIUM 9.3 01/18/2021   GFRAA 101 09/18/2020   QFTBGOLDPLUS NEGATIVE 01/09/2021    Speciality Comments: PLQ Eye Exam: 12/22/2019 WNL @ [facility] follow up in 6 months  Methotrexate-hair loss  Procedures:  No procedures performed Allergies: Contrast media [iodinated diagnostic agents], Adhesive [tape], Erythromycin, and Penicillins    Assessment / Plan:     Visit Diagnoses: Rheumatoid arthritis involving multiple sites with positive rheumatoid factor ([place]): She presents today with increased discomfort in the left shoulder joint, left wrist, and left hand.  1 week ago she was repairing collards and the next day she woke up with increased pain and inflammation in the left hand.  She was having difficulty making complete fist due to severity of pain and stiffness.  Her discomfort has gradually been improving but she continues to have some residual swelling in the left index finger.  She is currently on Humira 40 mg subcu injections every 14 days and Arava 20 mg 1 tablet daily.  He has been tolerating both medications without any side effects or injection site reactions.  She has not missed any doses of Humira or Arava.  She takes Tylenol very sparingly for pain relief and occasionally uses Voltaren gel topically as needed.  I tried to clarify how often she is having flares but it was difficult to determine the true frequency and severity of these flares.  Discussed signs and symptoms to monitor for.   She was advised to document how often she is having flares in order for [place] to assess the true efficacy of combination therapy.  She does not want to make any medication changes at this time.  Discussed that if she continues to have recurrent flares she is at risk for further joint damage and disease progression.  She would like to remain on Humira and Arava as prescribed.  She will follow up in 2 months or sooner if necessary.   High risk medication use - Humira 40 mg sq injections every 14 days and Arava 20 mg 1 tablet by mouth daily. (Discontinued MTX due to hair loss, previously on PLQ). CBC and CMP updated on 01/09/21.  Her next lab work will be due in January and every 3 months to monitor for toxicity.  Standing orders for CBC and CMP remain in place.  TB gold negative on 01/09/21 and will continue to monitor yearly. She has not had any recent infections.  Discussed the importance of holding Humira and Arava if she develops signs or symptoms of an infection and to resume once the infection has completely cleared.  She voiced understanding. Discussed the importance of yearly skin examinations while on Humira due to the increased risk for nonmelanoma skin cancer.  Referral to dermatology was placed on 03/09/2020 but the patient did not schedule an appointment at that time.  A new referral to dermatology will be placed today.  Primary osteoarthritis of both feet: She is not experiencing any discomfort in her feet at this time.  She has good range of motion of both ankle joints with no tenderness or joint swelling.  Pedal edema was noted bilaterally.  S/P total knee replacement, left -Chronic pain. Performed by Dr. Marlou Sa on 07/01/19.  Bone scan of both lower extremity performed on 11/30/2020: Left knee prosthesis with focally increased tracer uptake at left medial tibial plateau adjacent to prosthesis raising a question for aseptic loosening of the tibial component.  CT of the left knee performed on 12/01/2020: No  hardware failure or complication.  Mild thickening of the mid and distal patellar tendon noted. Patient was evaluated by Dr. Marlou Sa on 01/24/2021 at which time recent imaging results were discussed in detail.  There is no clear explanation as to the source of the lateral knee joint pain she is experiencing.  He did not recommend arthroscopic surgery or a revision at that time due to the risks outweighing the benefits.  He is concerned about the risk of infection especially since she is a diabetic as well as on immunosuppressive agents including Humira and Arava. No instability of the left knee was noted on examination today.  Other medical conditions are listed as follows:   Osteopenia, unspecified location - DEXA not in epic.  Rash: Resolved.   Diabetes mellitus type 2 with complications ([place])  Essential hypertension: BP was 131/71 today in the office.   History of hypothyroidism  Hyperlipidemia associated with type 2 diabetes mellitus ([place])  Orders: No orders of the defined types were placed in this encounter.  No orders of the defined types were placed in this encounter.    Follow-Up Instructions: Return in about 2 months (around 04/10/2021) for Rheumatoid arthritis, Osteoarthritis.   Ofilia Neas, PA-C  Note - This record has been created using Dragon software.  Chart creation errors have been sought, but may not always  have been located. Such creation errors do not reflect on  the standard of medical care.

## 2021-02-09 NOTE — Telephone Encounter (Addendum)
Received notification from [facility] regarding a prior authorization for [place]. Authorization has been APPROVED from 02/08/2021 to 02/08/2022. Sending approval letter to scan center.  Patient must continue to fill through [facility]: [phone]  Authorization # [id number]

## 2021-02-13 ENCOUNTER — Other Ambulatory Visit: Payer: Self-pay | Admitting: Internal Medicine

## 2021-02-13 DIAGNOSIS — M25562 Pain in left knee: Secondary | ICD-10-CM | POA: Diagnosis not present

## 2021-02-13 DIAGNOSIS — T8484XA Pain due to internal orthopedic prosthetic devices, implants and grafts, initial encounter: Secondary | ICD-10-CM | POA: Diagnosis not present

## 2021-02-22 ENCOUNTER — Other Ambulatory Visit: Payer: Self-pay | Admitting: Endocrinology

## 2021-02-23 ENCOUNTER — Other Ambulatory Visit: Payer: Self-pay | Admitting: Endocrinology

## 2021-02-26 ENCOUNTER — Ambulatory Visit: Payer: Medicare Other | Admitting: Rheumatology

## 2021-02-26 DIAGNOSIS — E119 Type 2 diabetes mellitus without complications: Secondary | ICD-10-CM | POA: Diagnosis not present

## 2021-03-20 ENCOUNTER — Other Ambulatory Visit: Payer: Self-pay | Admitting: Endocrinology

## 2021-03-26 ENCOUNTER — Other Ambulatory Visit: Payer: Self-pay | Admitting: Endocrinology

## 2021-03-26 ENCOUNTER — Other Ambulatory Visit: Payer: Self-pay | Admitting: Internal Medicine

## 2021-03-26 DIAGNOSIS — E063 Autoimmune thyroiditis: Secondary | ICD-10-CM

## 2021-03-27 ENCOUNTER — Ambulatory Visit: Payer: Medicare Other | Admitting: Internal Medicine

## 2021-03-28 DIAGNOSIS — E119 Type 2 diabetes mellitus without complications: Secondary | ICD-10-CM | POA: Diagnosis not present

## 2021-03-29 ENCOUNTER — Other Ambulatory Visit: Payer: Self-pay

## 2021-03-29 ENCOUNTER — Ambulatory Visit (INDEPENDENT_AMBULATORY_CARE_PROVIDER_SITE_OTHER): Payer: Medicare Other | Admitting: Internal Medicine

## 2021-03-29 ENCOUNTER — Encounter: Payer: Self-pay | Admitting: Internal Medicine

## 2021-03-29 DIAGNOSIS — B3731 Acute candidiasis of vulva and vagina: Secondary | ICD-10-CM | POA: Diagnosis not present

## 2021-03-29 DIAGNOSIS — Z0001 Encounter for general adult medical examination with abnormal findings: Secondary | ICD-10-CM

## 2021-03-29 DIAGNOSIS — E1169 Type 2 diabetes mellitus with other specified complication: Secondary | ICD-10-CM

## 2021-03-29 DIAGNOSIS — T8484XD Pain due to internal orthopedic prosthetic devices, implants and grafts, subsequent encounter: Secondary | ICD-10-CM

## 2021-03-29 DIAGNOSIS — M0579 Rheumatoid arthritis with rheumatoid factor of multiple sites without organ or systems involvement: Secondary | ICD-10-CM | POA: Diagnosis not present

## 2021-03-29 DIAGNOSIS — Z96659 Presence of unspecified artificial knee joint: Secondary | ICD-10-CM

## 2021-03-29 DIAGNOSIS — E039 Hypothyroidism, unspecified: Secondary | ICD-10-CM | POA: Diagnosis not present

## 2021-03-29 DIAGNOSIS — E785 Hyperlipidemia, unspecified: Secondary | ICD-10-CM

## 2021-03-29 DIAGNOSIS — E118 Type 2 diabetes mellitus with unspecified complications: Secondary | ICD-10-CM

## 2021-03-29 MED ORDER — FLUCONAZOLE 150 MG PO TABS: 150.0000 mg | ORAL_TABLET | ORAL | 0 refills | Status: DC

## 2021-03-29 NOTE — Patient Instructions (Addendum)
We have sent in diflucan to take today and then 1 pill on Monday if you are still having the yeast infection.  Try magnesium over the counter daily to see if this helps with muscle cramps.

## 2021-03-29 NOTE — Progress Notes (Signed)
° °  Subjective:   Patient ID: Marissa Roberts, female    DOB: 21-Nov-1945, 75 y.o.   MRN: [id number]  HPI The patient is a 75 YO female coming in for physical. Having some acute issues.   PMH, [facility], social history reviewed and updated  Review of Systems  Constitutional: Negative.   HENT: Negative.    Eyes: Negative.   Respiratory:  Negative for cough, chest tightness and shortness of breath.   Cardiovascular:  Negative for chest pain, palpitations and leg swelling.  Gastrointestinal:  Negative for abdominal distention, abdominal pain, constipation, diarrhea, nausea and vomiting.  Genitourinary:  Positive for vaginal discharge.  Musculoskeletal:  Positive for arthralgias and myalgias.  Skin: Negative.   Neurological: Negative.   Psychiatric/Behavioral: Negative.     Objective:  Physical Exam Constitutional:      Appearance: She is well-developed. She is obese.  HENT:     Head: Normocephalic and atraumatic.  Cardiovascular:     Rate and Rhythm: Normal rate and regular rhythm.  Pulmonary:     Effort: Pulmonary effort is normal. No respiratory distress.     Breath sounds: Normal breath sounds. No wheezing or rales.  Abdominal:     General: Bowel sounds are normal. There is no distension.     Palpations: Abdomen is soft.     Tenderness: There is no abdominal tenderness. There is no rebound.  Musculoskeletal:        General: Tenderness present.     Cervical back: Normal range of motion.  Skin:    General: Skin is warm and dry.  Neurological:     Mental Status: She is alert and oriented to person, place, and time.     Coordination: Coordination normal.    Vitals:   03/29/21 0912  BP: 128/80  Pulse: 77  Resp: 18  SpO2: 97%  Weight: 235 lb 9.6 oz (106.9 kg)  Height: 5\' 5"  (1.651 m)    This visit occurred during the SARS-CoV-2 public health emergency.  Safety protocols were in place, including screening questions prior to the visit, additional usage of staff PPE, and  extensive cleaning of exam room while observing appropriate contact time as indicated for disinfecting solutions.   Assessment & Plan:

## 2021-03-30 DIAGNOSIS — B3731 Acute candidiasis of vulva and vagina: Secondary | ICD-10-CM | POA: Insufficient documentation

## 2021-03-30 NOTE — Assessment & Plan Note (Signed)
Most recent HgA1c 6.5 and morning sugars running 120s-140s which is at goal. She is on multidose insulin regimen and metformin and farxiga. Endo manages and will continue to manage. No low sugars. Up to date on eye exam we will need to obtain records.

## 2021-03-30 NOTE — Assessment & Plan Note (Signed)
Is on farxiga which is high risk to cause yeast infections. Given diflucan 3 doses to take every 72 hours until fully resolved.

## 2021-03-30 NOTE — Assessment & Plan Note (Signed)
Flu shot up to date. Covid-19 up to date. Pneumonia complete. Shingrix complete. Tetanus due 2025. Colonoscopy due 2025. Mammogram due 2024, pap smear aged out and dexa due 2023-2025. Counseled about sun safety and mole surveillance. Counseled about the dangers of distracted driving. Given 10 year screening recommendations.

## 2021-03-30 NOTE — Assessment & Plan Note (Signed)
This is still limiting her movement and hurting daily. She has sought several opinions regarding the pain.

## 2021-03-30 NOTE — Assessment & Plan Note (Signed)
BMI 39 complicated by diabetes, hyperlipidemia, hypertension. Counseled about diet and exercise is limited due to joint pain.

## 2021-03-30 NOTE — Assessment & Plan Note (Signed)
Recent labs reviewed and appropriate. Continue crestor 40 mg daily.

## 2021-03-30 NOTE — Assessment & Plan Note (Signed)
Recent levels at goal and endo is managing her synthroid 112 mcg daily.

## 2021-03-30 NOTE — Assessment & Plan Note (Signed)
Taking humira through rheumatology and getting routine monitoring with them. Recent labs reviewed CBC and CMP.

## 2021-04-05 NOTE — Progress Notes (Signed)
Office Visit Note  Patient: Marissa Roberts             Date of Birth: [birth year]           MRN: [id number]             PCP: [name], MD Referring: [name], * Visit Date: 04/18/2021 Occupation: @GUAROCC @  Subjective:  Third finger swelling, unable to close hand   History of Present Illness: Marissa Roberts is a 76 y.o. female with history of seropositive rheumatoid arthritis, osteoarthritis.  She states she has been having increased pain and discomfort in her joints.  She describes discomfort in her right wrist joint and her left middle finger.  She also has discomfort in her left hip.  She has difficulty sleeping on her left side.  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  Activities of Daily Living:  Patient reports morning stiffness for 1-2 hours.   Patient Reports nocturnal pain.  Difficulty dressing/grooming: Denies Difficulty climbing stairs: Denies Difficulty getting out of chair: Denies Difficulty using hands for taps, buttons, cutlery, and/or writing: Reports  Review of Systems  Constitutional:  Positive for fatigue.  HENT:  Negative for mouth sores, mouth dryness and nose dryness.   Eyes:  Negative for pain, itching and dryness.  Respiratory:  Negative for shortness of breath and difficulty breathing.   Cardiovascular:  Negative for chest pain and palpitations.  Gastrointestinal:  Positive for constipation. Negative for blood in stool and diarrhea.  Endocrine: Negative for increased urination.  Genitourinary:  Negative for difficulty urinating.  Musculoskeletal:  Positive for joint pain, joint pain, joint swelling, myalgias, morning stiffness, muscle tenderness and myalgias.  Skin:  Negative for color change, rash, redness and sensitivity to sunlight.  Allergic/Immunologic: Negative for susceptible to infections.  Neurological:  Positive for numbness and memory loss. Negative for dizziness, headaches and weakness.  Hematological:   Negative for bruising/bleeding tendency.  Psychiatric/Behavioral:  Negative for depressed mood, confusion and sleep disturbance. The patient is not nervous/anxious.    PMFS History:  Patient Active Problem List   Diagnosis Date Noted   Vaginal yeast infection 03/30/2021   Displaced transverse fracture of left patella, subsequent encounter for closed fracture with delayed healing    Arthritis of knee 05/23/2020   Pain in knee region after total knee replacement ([place]) 03/22/2020   Arthritis of left knee 07/01/2019   Hypercholesterolemia 04/20/2019   B12 deficiency 03/23/2019   Anemia 02/05/2019   Rheumatoid arthritis with rheumatoid factor of multiple sites without organ or systems involvement ([place]) 01/25/2019   High risk medication use 01/25/2019   Fatigue 09/18/2018   Encounter for general adult medical examination with abnormal findings 02/07/2015   Morbid obesity ([place]) 04/19/2013   Hyperlipidemia associated with type 2 diabetes mellitus ([place]) 07/28/2009   Osteopenia 03/14/2009   Diabetes mellitus type 2 with complications ([place]) 00/86/7619   Hypothyroidism 09/24/2006   Essential hypertension 09/24/2006    Past Medical History:  Diagnosis Date   Anemia    Arthritis    Bronchitis    hx of   Diabetes mellitus    type 2   Family history of adverse reaction to anesthesia    per patient, "daughter threw up after a knee surgery"   GERD (gastroesophageal reflux disease)    Hypercholesteremia    Hypertension    Hypothyroidism    Pneumonia    hx of   PONV (postoperative nausea and vomiting)    Thyroid disease  Family History  Problem Relation Age of Onset   Diabetes Mother    Heart disease Mother    Hyperlipidemia Mother    Diabetes Father    Diabetes Sister    Hypertension Sister    Heart disease Sister    Dementia Sister    Heart disease Brother    Healthy Daughter    Healthy Daughter    Healthy Daughter    Healthy Son    Past Surgical History:  Procedure  Laterality Date   ABDOMINAL HYSTERECTOMY     partial   BREAST SURGERY     lumpectomy left breast   carpel tunnel     COLONOSCOPY  07/2020   COLONOSCOPY W/ POLYPECTOMY     FOOT SURGERY     knee arhtroscopy     KNEE ARTHROPLASTY Left 05/2020   ORIF PATELLA Left 05/23/2020   Procedure: LEFT KNEE POLE PATELLA EXCISION;  Surgeon: [name], MD;  Location: [place];  Service: Orthopedics;  Laterality: Left;   SHOULDER ARTHROSCOPY WITH SUBACROMIAL DECOMPRESSION, ROTATOR CUFF REPAIR AND BICEP TENDON REPAIR Right 05/26/2012   Procedure: Right Shoulder Diagnostic Operative Arthroscopy, Subacromial Decompression, Biceps Tenodesis, Mini Open Rotator Cuff Repair;  Surgeon: [name], MD;  Location: [place];  Service: Orthopedics;  Laterality: Right;  Right Shoulder Diagnostic Operative Arthroscopy, Subacromial Decompression, Biceps Tenodesis, Mini Open Rotator Cuff Repair   TONSILLECTOMY     TOTAL KNEE ARTHROPLASTY Left 07/01/2019   Procedure: LEFT TOTAL KNEE ARTHROPLASTY;  Surgeon: [name], MD;  Location: [place];  Service: Orthopedics;  Laterality: Left;   UPPER GI ENDOSCOPY  07/2020   Social History   Social History Narrative   Not on file   Immunization History  Administered Date(s) Administered   Fluad Quad(high Dose 65+) 01/02/2019, 12/31/2019   Influenza Split 01/03/2011   Influenza Whole 04/01/2005, 12/18/2009   Influenza, High Dose Seasonal PF 01/07/2014, 01/27/2015, 01/02/2016, 01/13/2017, 01/16/2018   Influenza-Unspecified 11/30/2012, 01/03/2021   PFIZER(Purple Top)SARS-COV-2 Vaccination 05/28/2019, 06/23/2019, 01/15/2020, 07/03/2020, 01/03/2021   Pneumococcal Conjugate-13 09/12/2016, 01/02/2019   Pneumococcal Polysaccharide-23 07/15/2014, 12/27/2019   Td 04/01/2001, 10/07/2013   Zoster Recombinat (Shingrix) 01/09/2021, 03/12/2021     Objective: Vital Signs: BP 124/77 (BP Location: Left Arm, Patient Position: Sitting, Cuff Size: Large)    Pulse 85    Resp  12    Ht 5\' 5"  (1.651 m)    Wt 231 lb 6.4 oz (105 kg)    BMI 38.51 kg/m    Physical Exam Vitals and nursing note reviewed.  Constitutional:      Appearance: She is well-developed.  HENT:     Head: Normocephalic and atraumatic.  Eyes:     Conjunctiva/sclera: Conjunctivae normal.  Cardiovascular:     Rate and Rhythm: Normal rate and regular rhythm.     Heart sounds: Normal heart sounds.  Pulmonary:     Effort: Pulmonary effort is normal.     Breath sounds: Normal breath sounds.  Abdominal:     General: Bowel sounds are normal.     Palpations: Abdomen is soft.  Musculoskeletal:     Cervical back: Normal range of motion.  Lymphadenopathy:     Cervical: No cervical adenopathy.  Skin:    General: Skin is warm and dry.     Capillary Refill: Capillary refill takes less than 2 seconds.  Neurological:     Mental Status: She is alert and oriented to person, place, and time.  Psychiatric:        Behavior:  Behavior normal.     Musculoskeletal Exam: C-spine was in good range of motion.  Shoulder joints, elbow joints, wrist joints with good range of motion.  She had no synovitis of the MCPs or PIP joints.  She had her left trigger finger.  Hip joints in good range of motion.  She had tenderness over left trochanteric bursa consistent with trochanteric bursitis.  Knee joints in good range of motion.  Left knee joint was replaced.  There was no tenderness over ankles or MTPs.  CDAI Exam: CDAI Score: 0.6  Patient Global: 4 mm; Provider Global: 2 mm Swollen: 0 ; Tender: 0  Joint Exam 04/18/2021   No joint exam has been documented for this visit   There is currently no information documented on the homunculus. Go to the Rheumatology activity and complete the homunculus joint exam.  Investigation: No additional findings.  Imaging: No results found.  Recent Labs: Lab Results  Component Value Date   WBC 11.9 (H) 04/16/2021   HGB 12.3 04/16/2021   PLT 253 04/16/2021   NA 142  04/16/2021   K 4.5 04/16/2021   CL 102 04/16/2021   CO2 32 04/16/2021   GLUCOSE 118 (H) 04/16/2021   BUN 15 04/16/2021   CREATININE 0.77 04/16/2021   BILITOT 0.3 04/16/2021   ALKPHOS 45 07/17/2020   AST 14 04/16/2021   ALT 15 04/16/2021   PROT 6.9 04/16/2021   ALBUMIN 4.0 07/17/2020   CALCIUM 10.7 (H) 04/16/2021   GFRAA 101 09/18/2020   QFTBGOLDPLUS NEGATIVE 01/09/2021    Speciality Comments: PLQ Eye Exam: 12/22/2019 WNL @ [facility] follow up in 6 months  Methotrexate-hair loss  Procedures:  Large Joint Inj: L greater trochanter on 04/18/2021 3:03 PM Indications: pain Details: 27 G 1.5 in needle, lateral approach  Arthrogram: No  Medications: 40 mg triamcinolone acetonide 40 MG/ML; 1.5 mL lidocaine 1 % Aspirate: 0 mL Outcome: tolerated well, no immediate complications Procedure, treatment alternatives, risks and benefits explained, specific risks discussed. Consent was given by the patient. Immediately prior to procedure a time out was called to verify the correct patient, procedure, equipment, support staff and site/side marked as required. Patient was prepped and draped in the usual sterile fashion.    Allergies: Contrast media [iodinated contrast media], Adhesive [tape], Erythromycin, and Penicillins   Assessment / Plan:     Visit Diagnoses: Rheumatoid arthritis involving multiple sites with positive rheumatoid factor (HCC)-patient complains of increased pain and discomfort in her joints and complains of having a flare.  She had no synovitis on examination.  She had discomfort in the left middle finger due to flexor tenosynovitis.  She also had tenderness over left trochanteric bursa.  I detailed discussion with her that her rheumatoid arthritis is quite well controlled on the combination of Humira and leflunomide.  We will schedule ultrasound-guided left trigger finger injection.  High risk medication use - Humira 40 mg sq injections every 14 days and Arava 20 mg 1  tablet by mouth daily. (Discontinued MTX due to hair loss, previously on PLQ).  Labs obtained on April 16, 2021 showed mild leukocytosis and mild hypercalcemia.  She was advised to stop calcium supplement.  All other labs were stable.  TB gold was negative on January 09, 2021.  Left findings were discussed with the patient.  Information regarding immunization was placed in the AVS.  She was also advised to hold Humira and leflunomide in case she develops an infection.  Annual skin examination to screen for  skin cancer was also advised while she is on Humira.  Trigger finger, left middle finger-we will schedule an ultrasound-guided left trigger finger injection.  Primary osteoarthritis of both hands-joint protection muscle strengthening was discussed.  Trochanteric bursitis, left hip-she had tenderness over left trochanteric bursa.  Patient requested a cortisone injection.  Increased risk of hyperglycemia with cortisone injection was discussed.  Patient stated that she had taken cortisone injection in the past and she knows how to monitor her blood glucose.  After informed consent was obtained left trochanteric bursa was injected with lidocaine and cortisone per patient's request.  Patient tolerated the procedure well.  Postprocedure instructions were given.  A handout on IT band stretches was given.  S/P total knee replacement, left - Performed by Dr. Marlou Sa on 07/01/19.  Bone scan of both lower extremity performed on 11/30/2020: Doing well.  Primary osteoarthritis of both feet-she had no tenderness over her ankles or MTPs.  Osteopenia, unspecified location - DEXA not in epic.  Diabetes mellitus type 2 with complications (HCC)-patient states that she has been monitoring her blood sugar and they have been in the desirable range.  Essential hypertension-blood pressure was normal today.  Hyperlipidemia associated with type 2 diabetes mellitus (HCC)-dietary modifications were discussed.  History of  hypothyroidism  Orders: Orders Placed This Encounter  Procedures   Large Joint Inj   No orders of the defined types were placed in this encounter.   Follow-Up Instructions: Return in about 5 months (around 09/16/2021) for Rheumatoid arthritis, Osteoarthritis.   Bo Merino, MD  Note - This record has been created using Editor, commissioning.  Chart creation errors have been sought, but may not always  have been located. Such creation errors do not reflect on  the standard of medical care.

## 2021-04-16 ENCOUNTER — Other Ambulatory Visit: Payer: Self-pay | Admitting: *Deleted

## 2021-04-16 ENCOUNTER — Other Ambulatory Visit: Payer: Self-pay | Admitting: Physician Assistant

## 2021-04-16 DIAGNOSIS — Z79899 Other long term (current) drug therapy: Secondary | ICD-10-CM

## 2021-04-16 DIAGNOSIS — Z9225 Personal history of immunosupression therapy: Secondary | ICD-10-CM

## 2021-04-16 DIAGNOSIS — M0579 Rheumatoid arthritis with rheumatoid factor of multiple sites without organ or systems involvement: Secondary | ICD-10-CM

## 2021-04-16 DIAGNOSIS — Z111 Encounter for screening for respiratory tuberculosis: Secondary | ICD-10-CM

## 2021-04-16 NOTE — Telephone Encounter (Signed)
Next Visit: 04/18/2021  Last Visit: 02/08/2021  Last Fill: 11/06/2020  OE:[id number] arthritis involving multiple sites with positive rheumatoid factor   Current Dose per office note 02/08/2021: Humira 40 mg sq injections every 14 days   Labs: 01/09/2021 Glucose is elevated-163. Rest of CMP WNL.  Absolute lymphocyte count is borderline elevated.  WBC count is WNL.  Rest of CBC WNL.    TB Gold: 01/09/2021 Neg    Patient advised she is due to update labs today. Patient states she will come today to update.   Okay to refill Humira?

## 2021-04-17 LAB — CBC WITH DIFFERENTIAL/PLATELET
Absolute Monocytes: 1000 cells/uL — ABNORMAL HIGH (ref 200–950)
Basophils Absolute: 71 cells/uL (ref 0–200)
Basophils Relative: 0.6 %
Eosinophils Absolute: 345 cells/uL (ref 15–500)
Eosinophils Relative: 2.9 %
HCT: 38.2 % (ref 35.0–45.0)
Hemoglobin: 12.3 g/dL (ref 11.7–15.5)
Lymphs Abs: 4927 cells/uL — ABNORMAL HIGH (ref 850–3900)
MCH: 27.8 pg (ref 27.0–33.0)
MCHC: 32.2 g/dL (ref 32.0–36.0)
MCV: 86.4 fL (ref 80.0–100.0)
MPV: 11.7 fL (ref 7.5–12.5)
Monocytes Relative: 8.4 %
Neutro Abs: 5557 cells/uL (ref 1500–7800)
Neutrophils Relative %: 46.7 %
Platelets: 253 10*3/uL (ref 140–400)
RBC: 4.42 10*6/uL (ref 3.80–5.10)
RDW: 13.5 % (ref 11.0–15.0)
Total Lymphocyte: 41.4 %
WBC: 11.9 10*3/uL — ABNORMAL HIGH (ref 3.8–10.8)

## 2021-04-17 LAB — COMPLETE METABOLIC PANEL WITH GFR
AG Ratio: 1.6 (calc) (ref 1.0–2.5)
ALT: 15 U/L (ref 6–29)
AST: 14 U/L (ref 10–35)
Albumin: 4.2 g/dL (ref 3.6–5.1)
Alkaline phosphatase (APISO): 42 U/L (ref 37–153)
BUN: 15 mg/dL (ref 7–25)
CO2: 32 mmol/L (ref 20–32)
Calcium: 10.7 mg/dL — ABNORMAL HIGH (ref 8.6–10.4)
Chloride: 102 mmol/L (ref 98–110)
Creat: 0.77 mg/dL (ref 0.60–1.00)
Globulin: 2.7 g/dL (calc) (ref 1.9–3.7)
Glucose, Bld: 118 mg/dL — ABNORMAL HIGH (ref 65–99)
Potassium: 4.5 mmol/L (ref 3.5–5.3)
Sodium: 142 mmol/L (ref 135–146)
Total Bilirubin: 0.3 mg/dL (ref 0.2–1.2)
Total Protein: 6.9 g/dL (ref 6.1–8.1)
eGFR: 81 mL/min/{1.73_m2} (ref >=60)

## 2021-04-17 NOTE — Progress Notes (Signed)
Glucose is 118. Calcium is elevated-10.7.  Please clarify if she is taking a calcium or vitamin D supplement.  She should hold supplements.   WBC count is elevated-11.9.  absolute lymphocytes and monocytes are elevated.  Please clarify if she has had any recent infections.

## 2021-04-18 ENCOUNTER — Other Ambulatory Visit: Payer: Self-pay

## 2021-04-18 ENCOUNTER — Ambulatory Visit (INDEPENDENT_AMBULATORY_CARE_PROVIDER_SITE_OTHER): Payer: Medicare Other | Admitting: Rheumatology

## 2021-04-18 ENCOUNTER — Encounter: Payer: Self-pay | Admitting: Rheumatology

## 2021-04-18 VITALS — BP 124/77 | HR 85 | Resp 12 | Ht 65.0 in | Wt 231.4 lb

## 2021-04-18 DIAGNOSIS — M7062 Trochanteric bursitis, left hip: Secondary | ICD-10-CM

## 2021-04-18 DIAGNOSIS — M8580 Other specified disorders of bone density and structure, unspecified site: Secondary | ICD-10-CM

## 2021-04-18 DIAGNOSIS — M0579 Rheumatoid arthritis with rheumatoid factor of multiple sites without organ or systems involvement: Secondary | ICD-10-CM

## 2021-04-18 DIAGNOSIS — E118 Type 2 diabetes mellitus with unspecified complications: Secondary | ICD-10-CM

## 2021-04-18 DIAGNOSIS — M19071 Primary osteoarthritis, right ankle and foot: Secondary | ICD-10-CM

## 2021-04-18 DIAGNOSIS — R21 Rash and other nonspecific skin eruption: Secondary | ICD-10-CM

## 2021-04-18 DIAGNOSIS — E1169 Type 2 diabetes mellitus with other specified complication: Secondary | ICD-10-CM

## 2021-04-18 DIAGNOSIS — M65332 Trigger finger, left middle finger: Secondary | ICD-10-CM | POA: Diagnosis not present

## 2021-04-18 DIAGNOSIS — E785 Hyperlipidemia, unspecified: Secondary | ICD-10-CM

## 2021-04-18 DIAGNOSIS — Z96652 Presence of left artificial knee joint: Secondary | ICD-10-CM

## 2021-04-18 DIAGNOSIS — Z8639 Personal history of other endocrine, nutritional and metabolic disease: Secondary | ICD-10-CM

## 2021-04-18 DIAGNOSIS — Z79899 Other long term (current) drug therapy: Secondary | ICD-10-CM | POA: Diagnosis not present

## 2021-04-18 DIAGNOSIS — M19042 Primary osteoarthritis, left hand: Secondary | ICD-10-CM

## 2021-04-18 DIAGNOSIS — I10 Essential (primary) hypertension: Secondary | ICD-10-CM

## 2021-04-18 DIAGNOSIS — M19041 Primary osteoarthritis, right hand: Secondary | ICD-10-CM

## 2021-04-18 DIAGNOSIS — M19072 Primary osteoarthritis, left ankle and foot: Secondary | ICD-10-CM

## 2021-04-18 MED ORDER — TRIAMCINOLONE ACETONIDE 40 MG/ML IJ SUSP
40.0000 mg | INTRAMUSCULAR | Status: AC | PRN
  Administered 2021-04-18: 40 mg via INTRA_ARTICULAR

## 2021-04-18 MED ORDER — LIDOCAINE HCL 1 % IJ SOLN
1.5000 mL | INTRAMUSCULAR | Status: AC | PRN
  Administered 2021-04-18: 1.5 mL

## 2021-04-18 NOTE — Patient Instructions (Signed)
Standing Labs We placed an order today for your standing lab work.   Please have your standing labs drawn in April and every 3 months  If possible, please have your labs drawn 2 weeks prior to your appointment so that the provider can discuss your results at your appointment.  Please note that you may see your imaging and lab results in [facility] before we have reviewed them. We may be awaiting multiple results to interpret others before contacting you. Please allow our office up to 72 hours to thoroughly review all of the results before contacting the office for clarification of your results.  We have open lab daily: Monday through Thursday from 1:30-4:30 PM and Friday from 1:30-4:00 PM at the office of Dr. Bo Merino, [facility].   Please be advised, all patients with office appointments requiring lab work will take precedent over walk-in lab work.  If possible, please come for your lab work on Monday and Friday afternoons, as you may experience shorter wait times. The office is located at [address] [place], [place], [place] [address] No appointment is necessary.   Labs are drawn by [facility]. Please bring your co-pay at the time of your lab draw.  You may receive a bill from [facility] for your lab work.  Please note if you are on Hydroxychloroquine and and an order has been placed for a Hydroxychloroquine level, you will need to have it drawn 4 hours or more after your last dose.  If you wish to have your labs drawn at another location, please call the office 24 hours in advance to send orders.  If you have any questions regarding directions or hours of operation,  please call [phone].   As a reminder, please drink plenty of water prior to coming for your lab work. Thanks!   Vaccines You are taking a medication(s) that can suppress your immune system.  The following immunizations are recommended: Flu annually Covid-19  Td/Tdap (tetanus, diphtheria, pertussis)  every 10 years Pneumonia (Prevnar 15 then Pneumovax 23 at least 1 year apart.  Alternatively, can take Prevnar 20 without needing additional dose) Shingrix: 2 doses from 4 weeks to 6 months apart  Please check with your PCP to make sure you are up to date.   If you have signs or symptoms of an infection or start antibiotics: First, call your PCP for workup of your infection. Hold your medication through the infection, until you complete your antibiotics, and until symptoms resolve if you take the following: Injectable medication (Actemra, Benlysta, Cimzia, Cosentyx, Enbrel, Humira, Kevzara, Orencia, Remicade, Simponi, Stelara, Taltz, Tremfya) Methotrexate Leflunomide (Arava) Mycophenolate (Cellcept) Morrie Sheldon, Olumiant, or Rinvoq  Please get annual skin examination to screen for skin cancer while you are on Humira  Iliotibial Band Syndrome Rehab Ask your health care provider which exercises are safe for you. Do exercises exactly as told by your health care provider and adjust them as directed. It is normal to feel mild stretching, pulling, tightness, or discomfort as you do these exercises. Stop right away if you feel sudden pain or your pain gets significantly worse. Do not begin these exercises until told by your health care provider. Stretching and range-of-motion exercises These exercises warm up your muscles and joints and improve the movement and flexibility of your hip and pelvis. Quadriceps stretch, prone  Lie on your abdomen (prone position) on a firm surface, such as a bed or padded floor. Bend your left / right knee and reach back to hold  your ankle or pant leg. If you cannot reach your ankle or pant leg, loop a belt around your foot and grab the belt instead. Gently pull your heel toward your buttocks. Your knee should not slide out to the side. You should feel a stretch in the front of your thigh and knee (quadriceps). Hold this position for __________ seconds. Repeat __________  times. Complete this exercise __________ times a day. Iliotibial band stretch An iliotibial band is a strong band of muscle tissue that runs from the outer side of your hip to the outer side of your thigh and knee. Lie on your side with your left / right leg in the top position. Bend both of your knees and grab your left / right ankle. Stretch out your bottom arm to help you balance. Slowly bring your top knee back so your thigh goes behind your trunk. Slowly lower your top leg toward the floor until you feel a gentle stretch on the outside of your left / right hip and thigh. If you do not feel a stretch and your knee will not fall farther, place the heel of your other foot on top of your knee and pull your knee down toward the floor with your foot. Hold this position for __________ seconds. Repeat __________ times. Complete this exercise __________ times a day. Strengthening exercises These exercises build strength and endurance in your hip and pelvis. Endurance is the ability to use your muscles for a long time, even after they get tired. Straight leg raises, side-lying This exercise strengthens the muscles that rotate the leg at the hip and move it away from your body (hip abductors). Lie on your side with your left / right leg in the top position. Lie so your head, shoulder, hip, and knee line up. You may bend your bottom knee to help you balance. Roll your hips slightly forward so your hips are stacked directly over each other and your left / right knee is facing forward. Tense the muscles in your outer thigh and lift your top leg 4-6 inches (10-15 cm). Hold this position for __________ seconds. Slowly lower your leg to return to the starting position. Let your muscles relax completely before doing another repetition. Repeat __________ times. Complete this exercise __________ times a day. Leg raises, prone This exercise strengthens the muscles that move the hips backward (hip  extensors). Lie on your abdomen (prone position) on your bed or a firm surface. You can put a pillow under your hips if that is more comfortable for your lower back. Bend your left / right knee so your foot is straight up in the air. Squeeze your buttocks muscles and lift your left / right thigh off the bed. Do not let your back arch. Tense your thigh muscle as hard as you can without increasing any knee pain. Hold this position for __________ seconds. Slowly lower your leg to return to the starting position and allow it to relax completely. Repeat __________ times. Complete this exercise __________ times a day. Hip hike Stand sideways on a bottom step. Stand on your left / right leg with your other foot unsupported next to the step. You can hold on to a railing or wall for balance if needed. Keep your knees straight and your torso square. Then lift your left / right hip up toward the ceiling. Slowly let your left / right hip lower toward the floor, past the starting position. Your foot should get closer to the floor. Do not  lean or bend your knees. Repeat __________ times. Complete this exercise __________ times a day. This information is not intended to replace advice given to you by your health care provider. Make sure you discuss any questions you have with your health care provider. Document Revised: 05/26/2019 Document Reviewed: 05/26/2019 Elsevier Patient Education  [place].

## 2021-04-23 ENCOUNTER — Telehealth: Payer: Self-pay

## 2021-04-23 ENCOUNTER — Telehealth: Payer: Self-pay | Admitting: Endocrinology

## 2021-04-23 DIAGNOSIS — Z90710 Acquired absence of both cervix and uterus: Secondary | ICD-10-CM | POA: Insufficient documentation

## 2021-04-23 DIAGNOSIS — N958 Other specified menopausal and perimenopausal disorders: Secondary | ICD-10-CM | POA: Diagnosis not present

## 2021-04-23 DIAGNOSIS — Z1231 Encounter for screening mammogram for malignant neoplasm of breast: Secondary | ICD-10-CM | POA: Diagnosis not present

## 2021-04-23 DIAGNOSIS — Z01419 Encounter for gynecological examination (general) (routine) without abnormal findings: Secondary | ICD-10-CM | POA: Diagnosis not present

## 2021-04-23 DIAGNOSIS — E1165 Type 2 diabetes mellitus with hyperglycemia: Secondary | ICD-10-CM

## 2021-04-23 DIAGNOSIS — Z6838 Body mass index (BMI) 38.0-38.9, adult: Secondary | ICD-10-CM | POA: Diagnosis not present

## 2021-04-23 LAB — HM DEXA SCAN: HM Dexa Scan: NORMAL

## 2021-04-23 MED ORDER — DEXCOM G6 TRANSMITTER MISC: 1 refills | Status: DC

## 2021-04-23 MED ORDER — DEXCOM G6 SENSOR MISC: 3 refills | Status: DC

## 2021-04-23 NOTE — Telephone Encounter (Signed)
Santiago Glad from [facility] called requesting approval on the below.  Continuous Blood Gluc Sensor (DEXCOM G6 SENSOR) MISC Continuous Blood Gluc Transmit (DEXCOM G6 TRANSMITTER) MISC  Documentation sent from [facility] on 04/20/21 requesting this. Please provide confirmation [phone]

## 2021-04-23 NOTE — Telephone Encounter (Signed)
Patient advised we do not prescribed pain medication in our office. Patient advised she will need to contact her PCP. Patient expressed understanding.

## 2021-04-23 NOTE — Telephone Encounter (Signed)
Patient left a voicemail stating she had an injection last week and is still in a lot of pain.  Patient is requesting pain medication.

## 2021-04-23 NOTE — Telephone Encounter (Signed)
E-scribed to [facility] now

## 2021-04-26 ENCOUNTER — Other Ambulatory Visit: Payer: Self-pay | Admitting: Endocrinology

## 2021-04-26 ENCOUNTER — Other Ambulatory Visit: Payer: Self-pay

## 2021-04-26 ENCOUNTER — Ambulatory Visit (INDEPENDENT_AMBULATORY_CARE_PROVIDER_SITE_OTHER): Payer: Medicare Other | Admitting: Internal Medicine

## 2021-04-26 ENCOUNTER — Encounter: Payer: Self-pay | Admitting: Internal Medicine

## 2021-04-26 DIAGNOSIS — M79605 Pain in left leg: Secondary | ICD-10-CM

## 2021-04-26 DIAGNOSIS — E119 Type 2 diabetes mellitus without complications: Secondary | ICD-10-CM | POA: Diagnosis not present

## 2021-04-26 MED ORDER — METHOCARBAMOL 500 MG PO TABS: 500.0000 mg | ORAL_TABLET | Freq: Three times a day (TID) | ORAL | 0 refills | Status: DC | PRN

## 2021-04-26 NOTE — Progress Notes (Signed)
° °  Subjective:   Patient ID: Marissa Roberts, female    DOB: Oct 27, 1945, 76 y.o.   MRN: [id number]  HPI The patient is a 76 YO female coming in for concerns about pain in her legs. She tries to walk around but this does not help. Taking tylenol and tried voltaren gel which did not help.   Review of Systems  Constitutional: Negative.   HENT: Negative.    Eyes: Negative.   Respiratory:  Negative for cough, chest tightness and shortness of breath.   Cardiovascular:  Negative for chest pain, palpitations and leg swelling.  Gastrointestinal:  Negative for abdominal distention, abdominal pain, constipation, diarrhea, nausea and vomiting.  Musculoskeletal:  Positive for arthralgias and myalgias.  Skin: Negative.   Neurological: Negative.   Psychiatric/Behavioral: Negative.     Objective:  Physical Exam Constitutional:      Appearance: She is well-developed.  HENT:     Head: Normocephalic and atraumatic.  Cardiovascular:     Rate and Rhythm: Normal rate and regular rhythm.  Pulmonary:     Effort: Pulmonary effort is normal. No respiratory distress.     Breath sounds: Normal breath sounds. No wheezing or rales.  Abdominal:     General: Bowel sounds are normal. There is no distension.     Palpations: Abdomen is soft.     Tenderness: There is no abdominal tenderness. There is no rebound.  Musculoskeletal:        General: Tenderness present.     Cervical back: Normal range of motion.     Comments: Pain in the left thigh region  Skin:    General: Skin is warm and dry.  Neurological:     Mental Status: She is alert and oriented to person, place, and time.     Coordination: Coordination normal.    Vitals:   04/26/21 0952  BP: 126/82  Pulse: 75  Resp: 18  SpO2: 99%  Weight: 227 lb 12.8 oz (103.3 kg)  Height: 5\' 5"  (1.651 m)    This visit occurred during the SARS-CoV-2 public health emergency.  Safety protocols were in place, including screening questions prior to the visit,  additional usage of staff PPE, and extensive cleaning of exam room while observing appropriate contact time as indicated for disinfecting solutions.   Assessment & Plan:

## 2021-04-26 NOTE — Assessment & Plan Note (Signed)
Not improved after recent L trochanter injection by rheumatology. Will try methocarbamol for likely muscle/tendon pain. Rx today.

## 2021-04-26 NOTE — Patient Instructions (Signed)
We have sent in the muscle relaxer methocarbamol to use up to 3 times a day.

## 2021-04-27 ENCOUNTER — Other Ambulatory Visit: Payer: Self-pay | Admitting: Physician Assistant

## 2021-04-27 ENCOUNTER — Other Ambulatory Visit: Payer: Self-pay | Admitting: Internal Medicine

## 2021-04-27 NOTE — Telephone Encounter (Signed)
Next Visit: 07/12/2021  Last Visit: 04/18/2021  Last Fill: 02/06/2021  DX: Rheumatoid arthritis involving multiple sites with positive rheumatoid factor   Current Dose per office note 04/18/2021: Arava 20 mg 1 tablet by mouth daily  Labs: 04/16/2021 Glucose is 118. Calcium is elevated-10.7. WBC count is elevated-11.9.  absolute lymphocytes and monocytes are elevated.    Okay to refill Arava?

## 2021-05-01 ENCOUNTER — Ambulatory Visit: Payer: Medicare Other | Admitting: Podiatry

## 2021-05-02 ENCOUNTER — Ambulatory Visit (INDEPENDENT_AMBULATORY_CARE_PROVIDER_SITE_OTHER): Payer: Medicare Other | Admitting: Podiatry

## 2021-05-02 ENCOUNTER — Encounter: Payer: Self-pay | Admitting: Podiatry

## 2021-05-02 ENCOUNTER — Telehealth: Payer: Self-pay | Admitting: Internal Medicine

## 2021-05-02 ENCOUNTER — Other Ambulatory Visit: Payer: Self-pay

## 2021-05-02 DIAGNOSIS — B351 Tinea unguium: Secondary | ICD-10-CM | POA: Diagnosis not present

## 2021-05-02 DIAGNOSIS — D649 Anemia, unspecified: Secondary | ICD-10-CM | POA: Diagnosis not present

## 2021-05-02 DIAGNOSIS — M79675 Pain in left toe(s): Secondary | ICD-10-CM | POA: Diagnosis not present

## 2021-05-02 DIAGNOSIS — E119 Type 2 diabetes mellitus without complications: Secondary | ICD-10-CM

## 2021-05-02 DIAGNOSIS — M79674 Pain in right toe(s): Secondary | ICD-10-CM

## 2021-05-02 DIAGNOSIS — Z794 Long term (current) use of insulin: Secondary | ICD-10-CM

## 2021-05-02 NOTE — Progress Notes (Signed)
This patient returns to my office for at risk foot care.  This patient requires this care by a professional since this patient will be at risk due to having diabetes mellitus and anemia.  This patient is unable to cut nails herself since the patient cannot reach her nails.These nails are painful walking and wearing shoes.  This patient presents for at risk foot care today.  General Appearance  Alert, conversant and in no acute stress.  Vascular  Dorsalis pedis and posterior tibial  pulses are palpable  bilaterally.  Capillary return is within normal limits  bilaterally. Temperature is within normal limits  bilaterally.  Neurologic  Senn-Weinstein monofilament wire test within normal limits  bilaterally. Muscle power within normal limits bilaterally.  Nails Thick disfigured discolored nails with subungual debris  from hallux to fifth toes bilaterally. No evidence of bacterial infection or drainage bilaterally.  Orthopedic  No limitations of motion  feet .  No crepitus or effusions noted.  No bony pathology or digital deformities noted.  Skin  normotropic skin with no porokeratosis noted bilaterally.  No signs of infections or ulcers noted.     Onychomycosis  Pain in right toes  Pain in left toes  Consent was obtained for treatment procedures.   Mechanical debridement of nails 1-5  bilaterally performed with a nail nipper.  Filed with dremel without incident.    Return office visit   3 months                   Told patient to return for periodic foot care and evaluation due to potential at risk complications.   Gardiner Barefoot DPM

## 2021-05-02 NOTE — Telephone Encounter (Signed)
Patient calling in  Patient says rx methocarbamol (ROBAXIN) 500 MG tablet is not working & she is still having the muscle spasms/restless leg issues  Wants to know what provider recommends   Please call [phone]

## 2021-05-02 NOTE — Telephone Encounter (Signed)
See below

## 2021-05-03 NOTE — Telephone Encounter (Signed)
Can we clarify does this help the pain temporarily when she takes it or it does not help at all? This is meant to treat the muscle pain and not prevent muscle pain/spasm.

## 2021-05-07 DIAGNOSIS — M545 Low back pain, unspecified: Secondary | ICD-10-CM | POA: Diagnosis not present

## 2021-05-07 DIAGNOSIS — M25552 Pain in left hip: Secondary | ICD-10-CM | POA: Diagnosis not present

## 2021-05-14 DIAGNOSIS — M79662 Pain in left lower leg: Secondary | ICD-10-CM | POA: Diagnosis not present

## 2021-05-14 DIAGNOSIS — M6281 Muscle weakness (generalized): Secondary | ICD-10-CM | POA: Diagnosis not present

## 2021-05-14 DIAGNOSIS — R262 Difficulty in walking, not elsewhere classified: Secondary | ICD-10-CM | POA: Diagnosis not present

## 2021-05-15 ENCOUNTER — Other Ambulatory Visit: Payer: Self-pay | Admitting: Endocrinology

## 2021-05-16 DIAGNOSIS — M6281 Muscle weakness (generalized): Secondary | ICD-10-CM | POA: Diagnosis not present

## 2021-05-16 DIAGNOSIS — M79662 Pain in left lower leg: Secondary | ICD-10-CM | POA: Diagnosis not present

## 2021-05-16 DIAGNOSIS — R262 Difficulty in walking, not elsewhere classified: Secondary | ICD-10-CM | POA: Diagnosis not present

## 2021-05-18 ENCOUNTER — Other Ambulatory Visit (INDEPENDENT_AMBULATORY_CARE_PROVIDER_SITE_OTHER): Payer: Medicare Other

## 2021-05-18 ENCOUNTER — Other Ambulatory Visit: Payer: Self-pay

## 2021-05-18 DIAGNOSIS — Z794 Long term (current) use of insulin: Secondary | ICD-10-CM | POA: Diagnosis not present

## 2021-05-18 DIAGNOSIS — E063 Autoimmune thyroiditis: Secondary | ICD-10-CM

## 2021-05-18 DIAGNOSIS — E1165 Type 2 diabetes mellitus with hyperglycemia: Secondary | ICD-10-CM

## 2021-05-18 LAB — COMPREHENSIVE METABOLIC PANEL
ALT: 28 U/L (ref 0–35)
AST: 38 U/L — ABNORMAL HIGH (ref 0–37)
Albumin: 3.9 g/dL (ref 3.5–5.2)
Alkaline Phosphatase: 52 U/L (ref 39–117)
BUN: 14 mg/dL (ref 6–23)
CO2: 30 mEq/L (ref 19–32)
Calcium: 9.3 mg/dL (ref 8.4–10.5)
Chloride: 91 mEq/L — ABNORMAL LOW (ref 96–112)
Creatinine, Ser: 0.85 mg/dL (ref 0.40–1.20)
GFR: 67.82 mL/min (ref >60.00)
Glucose, Bld: 105 mg/dL — ABNORMAL HIGH (ref 70–99)
Potassium: 4.3 mEq/L (ref 3.5–5.1)
Sodium: 129 mEq/L — ABNORMAL LOW (ref 135–145)
Total Bilirubin: 0.3 mg/dL (ref 0.2–1.2)
Total Protein: 6.5 g/dL (ref 6.0–8.3)

## 2021-05-18 LAB — LDL CHOLESTEROL, DIRECT: Direct LDL: 62 mg/dL

## 2021-05-18 LAB — HEMOGLOBIN A1C: Hgb A1c MFr Bld: 7 % — ABNORMAL HIGH (ref 4.6–6.5)

## 2021-05-18 LAB — TSH: TSH: 3.85 u[IU]/mL (ref 0.35–5.50)

## 2021-05-18 NOTE — Progress Notes (Deleted)
Total volume 2,300.  Started 05-17-2021 at 8:30 am. Ended 05-18-2021 at 8:00 am.

## 2021-05-18 NOTE — Progress Notes (Unsigned)
Entered note on wrong account so I deleted it.

## 2021-05-21 ENCOUNTER — Other Ambulatory Visit: Payer: Self-pay | Admitting: Orthopaedic Surgery

## 2021-05-21 DIAGNOSIS — M25562 Pain in left knee: Secondary | ICD-10-CM | POA: Diagnosis not present

## 2021-05-21 DIAGNOSIS — M545 Low back pain, unspecified: Secondary | ICD-10-CM

## 2021-05-22 ENCOUNTER — Ambulatory Visit: Payer: Medicare Other | Admitting: Endocrinology

## 2021-05-23 ENCOUNTER — Telehealth: Payer: Self-pay | Admitting: Endocrinology

## 2021-05-23 DIAGNOSIS — I10 Essential (primary) hypertension: Secondary | ICD-10-CM

## 2021-05-23 NOTE — Telephone Encounter (Signed)
Please let her know that her sodium concentration is very low.  Likely from the HCTZ and her blood pressure pill.  We will need to change her valsartan HCT to plain valsartan 320 mg daily

## 2021-05-24 MED ORDER — VALSARTAN 320 MG PO TABS: 320.0000 mg | ORAL_TABLET | Freq: Every day | ORAL | 3 refills | Status: DC

## 2021-05-25 ENCOUNTER — Other Ambulatory Visit: Payer: Self-pay | Admitting: Internal Medicine

## 2021-05-26 DIAGNOSIS — E119 Type 2 diabetes mellitus without complications: Secondary | ICD-10-CM | POA: Diagnosis not present

## 2021-05-30 ENCOUNTER — Other Ambulatory Visit: Payer: Self-pay

## 2021-05-30 ENCOUNTER — Ambulatory Visit (INDEPENDENT_AMBULATORY_CARE_PROVIDER_SITE_OTHER): Payer: Medicare Other | Admitting: Endocrinology

## 2021-05-30 ENCOUNTER — Encounter: Payer: Self-pay | Admitting: Endocrinology

## 2021-05-30 VITALS — BP 122/68 | HR 81 | Ht 65.0 in | Wt 213.8 lb

## 2021-05-30 DIAGNOSIS — E871 Hypo-osmolality and hyponatremia: Secondary | ICD-10-CM | POA: Diagnosis not present

## 2021-05-30 DIAGNOSIS — E1169 Type 2 diabetes mellitus with other specified complication: Secondary | ICD-10-CM

## 2021-05-30 DIAGNOSIS — E785 Hyperlipidemia, unspecified: Secondary | ICD-10-CM

## 2021-05-30 DIAGNOSIS — E118 Type 2 diabetes mellitus with unspecified complications: Secondary | ICD-10-CM

## 2021-05-30 LAB — BASIC METABOLIC PANEL
BUN: 13 mg/dL (ref 6–23)
CO2: 27 mEq/L (ref 19–32)
Calcium: 9.4 mg/dL (ref 8.4–10.5)
Chloride: 98 mEq/L (ref 96–112)
Creatinine, Ser: 0.72 mg/dL (ref 0.40–1.20)
GFR: 82.75 mL/min (ref >60.00)
Glucose, Bld: 82 mg/dL (ref 70–99)
Potassium: 4.7 mEq/L (ref 3.5–5.1)
Sodium: 133 mEq/L — ABNORMAL LOW (ref 135–145)

## 2021-05-30 LAB — MICROALBUMIN / CREATININE URINE RATIO
Creatinine,U: 56 mg/dL
Microalb Creat Ratio: 17.3 mg/g (ref 0.0–30.0)
Microalb, Ur: 9.7 mg/dL — ABNORMAL HIGH (ref 0.0–1.9)

## 2021-05-30 NOTE — Patient Instructions (Signed)
Marissa Roberts ?

## 2021-05-30 NOTE — Progress Notes (Signed)
Please call to let patient know that the sodium level is improving, no changes needed, kidneys look okay

## 2021-05-30 NOTE — Progress Notes (Signed)
Patient ID: Marissa Roberts, female   DOB: May 13, 1945, 76 y.o.   MRN: [id number]               Reason for Appointment:  Follow-up for diabetes and hypertension   History of Present Illness:             Type 2 diabetes mellitus, date of diagnosis: 2005       INSULIN regimen is  Tresiba 34 units am, Novolog Recently  Oral hypoglycemic drugs the patient is taking are: Metformin 2 g daily, Farxiga 5 mg daily   Current blood sugar patterns, management and problems identified:  Her A1c is 7%  She has lost a significant amount of weight She has gone back on her [place] also without any side effects Overall blood sugars are much lower and she has not taken NovoLog usually because of relatively low blood sugars which are averaging about 105 in the last 2 weeks Generally has decreased appetite and eating small portions However tending to eat out frequently now Only occasionally has high blood sugars but generally within the target range under 180 Has not missed any doses of Tresiba     Dinner usually at 6-7pm  Side effects from medications have been:none  Analysis of the [facility] G6 download for the last 2 weeks as follows  Her blood sugars are extremely stable overnight During the daytime she may have periodic hyperglycemia after lunch or dinner but this is less frequent in the last week compared to the first week Has only a couple of readings spiking over 180 in the second week Overnight blood sugars are very stable in the normal range and in the first week did have some artifacts No hypoglycemia  CGM use % of time 93  2-week average/SD 105/22  Time in range 97  % Time Above 180 1  % Time above 250   % Time Below 70 1     Previously:  CGM use % of time 57  2-week average/GV 149/35  Time in range    84    %  % Time Above 180 16  % Time above 250   % Time Below 70 0     PRE-MEAL Fasting Lunch Dinner Bedtime Overall  Glucose range:       Averages: 141        POST-MEAL PC Breakfast PC Lunch PC Dinner  Glucose range:     Averages: 161 192 158      Self-care:   Meals: 3 meals per day. Breakfast is usually grits with or without eggs or meat Not always restricting fat intake or sweet tea  when eating out        Exercise:  Minimal, she has knee pain  Dietician visit, most recent:2014.               Weight history:  Wt Readings from Last 3 Encounters:  05/30/21 213 lb 12.8 oz (97 kg)  04/26/21 227 lb 12.8 oz (103.3 kg)  04/18/21 231 lb 6.4 oz (105 kg)    Glycemic control:   Lab Results  Component Value Date   HGBA1C 7.0 (H) 05/18/2021   HGBA1C 6.5 01/18/2021   HGBA1C 6.5 10/24/2020   Lab Results  Component Value Date   MICROALBUR 10.2 (H) 07/17/2020   LDLCALC 61 08/24/2019   CREATININE 0.85 05/18/2021   No results found for: FRUCTOSAMINE    Past history:  She thinks her blood sugars were only mildly increased at borderline  levels at the onsetper Most likely she was treated with metformin initially and this has been continued.   At some point she was also tried on [place] and [place] for improving her control. She is not sure why she was started on insulin 5 years ago, presumably for worsening hyperglycemia Also not clear if she has tried various insulin regimens before starting Lantus and NovoLog  Previously had tried the V-go pump but subsequently she stopped doing the V-go pump because of the cost, skin irritation and local discomfort   Allergies as of 05/30/2021       Reactions   Contrast Media [iodinated Contrast Media]    Throat swells from ct contra   Adhesive [tape]    No paper tape, causes blisters   Erythromycin Rash   Penicillins Rash   Did it involve swelling of the face/tongue/throat, SOB, or low BP? No Did it involve sudden or severe rash/hives, skin peeling, or any reaction on the inside of your mouth or nose? No Did you need to seek medical attention at a hospital or doctor's office? No When did  it last happen?    Many years If all above answers are NO, may proceed with cephalosporin use.        Medication List        Accurate as of May 30, 2021  9:26 AM. If you have any questions, ask your nurse or doctor.          amLODipine 10 MG tablet Commonly known as: NORVASC Take 1 tablet by mouth once daily   BD Pen Needle Nano U/F 32G X 4 MM Misc Generic drug: Insulin Pen Needle USE  4 TIMES DAILY TO  INJECT  INSULIN   Biotin 10000 MCG Tabs Take 10,000 mcg by mouth daily.   CALCIUM 600 + D PO Take 2 tablets by mouth daily.   cephALEXin 500 MG capsule Commonly known as: KEFLEX TAKE 4 CAPSULES (2,000 MG) 30-60 MINUTES PRIOR TO DENTAL PROCEDURE   clotrimazole 1 % cream Commonly known as: LOTRIMIN Apply to both feet and between toes twice daily for 6 weeks.   dapagliflozin propanediol 10 MG Tabs tablet Commonly known as: Farxiga Take 1 tablet (10 mg total) by mouth daily before breakfast.   Dexcom G6 Receiver Devi For sensor monitoring   Dexcom G6 Sensor Misc Use to monitor blood sugar, change after 10 days   Dexcom G6 Transmitter Misc 4 PER YEAR   diclofenac Sodium 1 % Gel Commonly known as: VOLTAREN Apply 2-4 grams to affected joint 4 times daily as needed.   fluticasone 50 MCG/ACT nasal spray Commonly known as: FLONASE Place 2 sprays into both nostrils 2 (two) times daily as needed for allergies or rhinitis.   furosemide 40 MG tablet Commonly known as: LASIX Take 2 tablets by mouth once daily   GLUCOSAMINE-MSM PO Take 1 tablet by mouth daily.   Humira Pen 40 MG/0.4ML Pnkt Generic drug: Adalimumab INJECT 1 PEN UNDER THE SKIN EVERY 14 DAYS.   latanoprost 0.005 % ophthalmic solution Commonly known as: XALATAN SMARTSIG:1 Drop(s) In Eye(s) Every Evening   leflunomide 20 MG tablet Commonly known as: ARAVA Take 1 tablet by mouth once daily   levothyroxine 112 MCG tablet Commonly known as: SYNTHROID TAKE 1 TABLET BY MOUTH BEFORE BREAKFAST    linaclotide 290 MCG Caps capsule Commonly known as: LINZESS Take 1 capsule (290 mcg total) by mouth daily as needed. What changed: reasons to take this   Magnesium 100 MG Caps  Take 1 capsule by mouth.   metFORMIN 1000 MG tablet Commonly known as: GLUCOPHAGE TAKE 1 TABLET BY MOUTH TWICE DAILY WITH A MEAL   methocarbamol 500 MG tablet Commonly known as: Robaxin Take 1 tablet (500 mg total) by mouth every 8 (eight) hours as needed for muscle spasms.   montelukast 10 MG tablet Commonly known as: SINGULAIR TAKE 1 TABLET BY MOUTH AT BEDTIME   multivitamin with minerals Tabs tablet Take 1 tablet by mouth daily.   NovoLOG FlexPen 100 UNIT/ML FlexPen Generic drug: insulin aspart INJECT 10 TO 15 UNITS SUBCUTANEOUSLY THREE TIMES DAILY BEFORE MEAL(S)   omeprazole 40 MG capsule Commonly known as: PRILOSEC Take 40 mg by mouth daily.   OneTouch Delica Plus [id number] Misc USE TO CHECK BLOOD SUGAR UP TO THREE TIMES A DAY 2 HOURS AFTER A MEAL AND UP TO 3 TIMES A WEEK UPON WAKING UP   OneTouch Verio test strip Generic drug: glucose blood CHECK BLOOD SUGAR 4 TIMES DAILY   potassium chloride 10 MEQ tablet Commonly known as: KLOR-CON Take 1 tablet (10 mEq total) by mouth daily.   rosuvastatin 40 MG tablet Commonly known as: CRESTOR Take 1 tablet by mouth once daily   traMADol 50 MG tablet Commonly known as: Ultram Take 1 tablet (50 mg total) by mouth daily as needed.   Tyler Aas FlexTouch 200 UNIT/ML FlexTouch Pen Generic drug: insulin degludec INJECT 36 UNITS SUBCUTANEOUSLY IN THE MORNING   valsartan 320 MG tablet Commonly known as: DIOVAN Take 1 tablet (320 mg total) by mouth daily.        Allergies:  Allergies  Allergen Reactions   Contrast Media [Iodinated Contrast Media]     Throat swells from ct contra   Adhesive [Tape]     No paper tape, causes blisters   Erythromycin Rash   Penicillins Rash    Did it involve swelling of the face/tongue/throat, SOB, or low BP?  No Did it involve sudden or severe rash/hives, skin peeling, or any reaction on the inside of your mouth or nose? No Did you need to seek medical attention at a hospital or doctor's office? No When did it last happen?    Many years If all above answers are NO, may proceed with cephalosporin use.     Past Medical History:  Diagnosis Date   Anemia    Arthritis    Bronchitis    hx of   Diabetes mellitus    type 2   Family history of adverse reaction to anesthesia    per patient, "daughter threw up after a knee surgery"   GERD (gastroesophageal reflux disease)    Hypercholesteremia    Hypertension    Hypothyroidism    Pneumonia    hx of   PONV (postoperative nausea and vomiting)    Thyroid disease     Past Surgical History:  Procedure Laterality Date   ABDOMINAL HYSTERECTOMY     partial   BREAST SURGERY     lumpectomy left breast   carpel tunnel     COLONOSCOPY  07/2020   COLONOSCOPY W/ POLYPECTOMY     FOOT SURGERY     knee arhtroscopy     KNEE ARTHROPLASTY Left 05/2020   ORIF PATELLA Left 05/23/2020   Procedure: LEFT KNEE POLE PATELLA EXCISION;  Surgeon: [name], MD;  Location: [place];  Service: Orthopedics;  Laterality: Left;   SHOULDER ARTHROSCOPY WITH SUBACROMIAL DECOMPRESSION, ROTATOR CUFF REPAIR AND BICEP TENDON REPAIR Right 05/26/2012   Procedure: Right Shoulder  Diagnostic Operative Arthroscopy, Subacromial Decompression, Biceps Tenodesis, Mini Open Rotator Cuff Repair;  Surgeon: [name], MD;  Location: [facility];  Service: Orthopedics;  Laterality: Right;  Right Shoulder Diagnostic Operative Arthroscopy, Subacromial Decompression, Biceps Tenodesis, Mini Open Rotator Cuff Repair   TONSILLECTOMY     TOTAL KNEE ARTHROPLASTY Left 07/01/2019   Procedure: LEFT TOTAL KNEE ARTHROPLASTY;  Surgeon: [name], MD;  Location: [place];  Service: Orthopedics;  Laterality: Left;   UPPER GI ENDOSCOPY  07/2020    Family History  Problem Relation Age  of Onset   Diabetes Mother    Heart disease Mother    Hyperlipidemia Mother    Diabetes Father    Diabetes Sister    Hypertension Sister    Heart disease Sister    Dementia Sister    Heart disease Brother    Healthy Daughter    Healthy Daughter    Healthy Daughter    Healthy Son     Social History:  reports that she quit smoking about 31 years ago. Her smoking use included cigarettes. She has never used smokeless tobacco. She reports that she does not currently use alcohol. She reports that she does not use drugs.    Review of Systems   HYPERTENSION: She is on amlodipine 5 mg once daily and Diovan to 20 mg  Was tried on diltiazem instead of amlodipine before but she felt that this was causing hair loss and constipation  About 10 days ago her HCTZ was stopped because of sodium of 129  She is checking blood pressure at home, again systolic reading is usually around 120.  BP Readings from Last 3 Encounters:  05/30/21 122/68  04/26/21 126/82  04/18/21 124/77    On Lasix 40 mg for edema  NEPHROPATHY: She has mild persistently high microalbumin  LIPIDS:  She is taking rosuvastatin 40 mg, previously LDL was high with simvastatin 20 mg LDL below 100 Crestor prescribed by PCP      Lab Results  Component Value Date   CHOL 133 08/24/2019   HDL 59.70 08/24/2019   LDLCALC 61 08/24/2019   LDLDIRECT 62.0 05/18/2021   TRIG 64.0 08/24/2019   CHOLHDL 2 08/24/2019                  Thyroid:      She has been hypothyroid for over 40 years  The dose has been adjusted periodically She takes levothyroxine before breakfast consistently No unusual or unexpected fatigue  TSH has stabilized within the normal range using 112 mcg   Last labs:   Lab Results  Component Value Date   TSH 3.85 05/18/2021   TSH 2.75 01/18/2021   TSH 0.32 (L) 07/17/2020   FREET4 1.01 01/18/2021   FREET4 1.58 08/24/2019   FREET4 1.11 07/21/2018    She is followed periodically by  podiatrist  Physical Examination:  BP 122/68    Pulse 81    Ht 5\' 5"  (1.651 m)    Wt 213 lb 12.8 oz (97 kg)    SpO2 99%    BMI 35.58 kg/m    ASSESSMENT/PLAN:  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:  See history of present illness for detailed discussion of current diabetes management, blood sugar patterns and problems identified  She is currently on basal insulin, Farxiga 5 mg and metformin  A1c is 7%  She has less effect her blood sugars with restarting [place] as well as recent weight loss and overall decreased intake Now not needing any  mealtime insulin and only has minimal increase in blood sugars after meals depending on her diet Also for various reasons has not been eating home-cooked food and eating more restaurant meals Currently not able to exercise at all No hypoglycemia with current basal insulin   Recommendations:  Continue same doses of Tresiba insulin Continue to avoid sweet tea Stay on Farxiga unchanged  HYPERTENSION with microalbuminuria: Blood pressure is improved and may be benefiting from weight loss and taking [place] However HYPONATREMIA is likely to be from HCTZ and will recheck today  If sodium is still low will check urine osmolality  Will need follow-up microalbumin today  LIPIDS: LDL is below 100   There are no Patient Instructions on file for this visit.   Elayne Snare 05/30/2021, 9:26 AM   Note: This office note was prepared with Dragon voice recognition system technology. Any transcriptional errors that result from this process are unintentional.

## 2021-06-01 ENCOUNTER — Encounter: Payer: Self-pay | Admitting: Endocrinology

## 2021-06-02 ENCOUNTER — Other Ambulatory Visit: Payer: Medicare Other

## 2021-06-04 ENCOUNTER — Ambulatory Visit
Admission: RE | Admit: 2021-06-04 | Discharge: 2021-06-04 | Disposition: A | Discharge status: Home / Self Care | Payer: Medicare Other | Source: Ambulatory Visit | Attending: Orthopaedic Surgery | Admitting: Orthopaedic Surgery

## 2021-06-04 DIAGNOSIS — M545 Low back pain, unspecified: Secondary | ICD-10-CM

## 2021-06-04 DIAGNOSIS — M5126 Other intervertebral disc displacement, lumbar region: Secondary | ICD-10-CM | POA: Diagnosis not present

## 2021-06-04 DIAGNOSIS — M4316 Spondylolisthesis, lumbar region: Secondary | ICD-10-CM | POA: Diagnosis not present

## 2021-06-05 ENCOUNTER — Other Ambulatory Visit: Payer: Medicare Other

## 2021-06-06 DIAGNOSIS — M545 Low back pain, unspecified: Secondary | ICD-10-CM | POA: Diagnosis not present

## 2021-06-07 ENCOUNTER — Other Ambulatory Visit: Payer: Self-pay | Admitting: Physician Assistant

## 2021-06-07 DIAGNOSIS — M0579 Rheumatoid arthritis with rheumatoid factor of multiple sites without organ or systems involvement: Secondary | ICD-10-CM

## 2021-06-07 NOTE — Telephone Encounter (Signed)
Next Visit: 07/12/2021 ? ?Last Visit: 04/18/2021 ? ?Last Fill: 04/16/2021 (30 day supply) ? ?[id number] arthritis involving multiple sites with positive rheumatoid factor  ? ?Current Dose per office note 04/18/2021: Humira 40 mg sq injections every 14 days ? ?Labs: 04/16/2021  Glucose is 118. Calcium is elevated-10.7. WBC count is elevated-11.9.  absolute lymphocytes and monocytes are elevated.  ? ?TB Gold: 01/09/2021 Neg   ? ?Okay to refill Humira?  ?

## 2021-06-08 DIAGNOSIS — Z6835 Body mass index (BMI) 35.0-35.9, adult: Secondary | ICD-10-CM | POA: Diagnosis not present

## 2021-06-08 DIAGNOSIS — M5416 Radiculopathy, lumbar region: Secondary | ICD-10-CM | POA: Diagnosis not present

## 2021-06-19 ENCOUNTER — Other Ambulatory Visit: Payer: Self-pay | Admitting: Endocrinology

## 2021-06-20 DIAGNOSIS — M5416 Radiculopathy, lumbar region: Secondary | ICD-10-CM | POA: Diagnosis not present

## 2021-06-25 DIAGNOSIS — E119 Type 2 diabetes mellitus without complications: Secondary | ICD-10-CM | POA: Diagnosis not present

## 2021-06-28 ENCOUNTER — Other Ambulatory Visit: Payer: Self-pay | Admitting: Endocrinology

## 2021-06-28 DIAGNOSIS — E063 Autoimmune thyroiditis: Secondary | ICD-10-CM

## 2021-06-30 ENCOUNTER — Emergency Department (HOSPITAL_COMMUNITY)
Admission: EM | Admit: 2021-06-30 | Discharge: 2021-06-30 | Disposition: A | Discharge status: Home / Self Care | Payer: Medicare Other | Attending: Emergency Medicine | Admitting: Emergency Medicine

## 2021-06-30 ENCOUNTER — Other Ambulatory Visit: Payer: Self-pay

## 2021-06-30 ENCOUNTER — Encounter (HOSPITAL_COMMUNITY): Payer: Self-pay | Admitting: Emergency Medicine

## 2021-06-30 ENCOUNTER — Emergency Department (HOSPITAL_COMMUNITY): Payer: Medicare Other

## 2021-06-30 DIAGNOSIS — Z7984 Long term (current) use of oral hypoglycemic drugs: Secondary | ICD-10-CM | POA: Insufficient documentation

## 2021-06-30 DIAGNOSIS — M47817 Spondylosis without myelopathy or radiculopathy, lumbosacral region: Secondary | ICD-10-CM | POA: Diagnosis not present

## 2021-06-30 DIAGNOSIS — Z794 Long term (current) use of insulin: Secondary | ICD-10-CM | POA: Insufficient documentation

## 2021-06-30 DIAGNOSIS — M5126 Other intervertebral disc displacement, lumbar region: Secondary | ICD-10-CM | POA: Diagnosis not present

## 2021-06-30 DIAGNOSIS — M5127 Other intervertebral disc displacement, lumbosacral region: Secondary | ICD-10-CM | POA: Diagnosis not present

## 2021-06-30 DIAGNOSIS — M5442 Lumbago with sciatica, left side: Secondary | ICD-10-CM | POA: Diagnosis not present

## 2021-06-30 DIAGNOSIS — M545 Low back pain, unspecified: Secondary | ICD-10-CM | POA: Diagnosis present

## 2021-06-30 DIAGNOSIS — M5432 Sciatica, left side: Secondary | ICD-10-CM

## 2021-06-30 DIAGNOSIS — Z79899 Other long term (current) drug therapy: Secondary | ICD-10-CM | POA: Insufficient documentation

## 2021-06-30 DIAGNOSIS — I10 Essential (primary) hypertension: Secondary | ICD-10-CM | POA: Diagnosis not present

## 2021-06-30 DIAGNOSIS — E119 Type 2 diabetes mellitus without complications: Secondary | ICD-10-CM | POA: Diagnosis not present

## 2021-06-30 DIAGNOSIS — M4697 Unspecified inflammatory spondylopathy, lumbosacral region: Secondary | ICD-10-CM | POA: Diagnosis not present

## 2021-06-30 LAB — I-STAT CHEM 8, ED
BUN: 5 mg/dL — ABNORMAL LOW (ref 8–23)
Calcium, Ion: 1.15 mmol/L (ref 1.15–1.40)
Chloride: 102 mmol/L (ref 98–111)
Creatinine, Ser: 0.6 mg/dL (ref 0.44–1.00)
Glucose, Bld: 111 mg/dL — ABNORMAL HIGH (ref 70–99)
HCT: 35 % — ABNORMAL LOW (ref 36.0–46.0)
Hemoglobin: 11.9 g/dL — ABNORMAL LOW (ref 12.0–15.0)
Potassium: 3.8 mmol/L (ref 3.5–5.1)
Sodium: 138 mmol/L (ref 135–145)
TCO2: 28 mmol/L (ref 22–32)

## 2021-06-30 LAB — CBC WITH DIFFERENTIAL/PLATELET
Abs Immature Granulocytes: 0.04 10*3/uL (ref 0.00–0.07)
Basophils Absolute: 0.1 10*3/uL (ref 0.0–0.1)
Basophils Relative: 1 %
Eosinophils Absolute: 0.1 10*3/uL (ref 0.0–0.5)
Eosinophils Relative: 1 %
HCT: 34.4 % — ABNORMAL LOW (ref 36.0–46.0)
Hemoglobin: 11.1 g/dL — ABNORMAL LOW (ref 12.0–15.0)
Immature Granulocytes: 0 %
Lymphocytes Relative: 34 %
Lymphs Abs: 3.7 10*3/uL (ref 0.7–4.0)
MCH: 28.5 pg (ref 26.0–34.0)
MCHC: 32.3 g/dL (ref 30.0–36.0)
MCV: 88.2 fL (ref 80.0–100.0)
Monocytes Absolute: 1.1 10*3/uL — ABNORMAL HIGH (ref 0.1–1.0)
Monocytes Relative: 10 %
Neutro Abs: 6 10*3/uL (ref 1.7–7.7)
Neutrophils Relative %: 54 %
Platelets: 222 10*3/uL (ref 150–400)
RBC: 3.9 MIL/uL (ref 3.87–5.11)
RDW: 16.9 % — ABNORMAL HIGH (ref 11.5–15.5)
WBC: 11 10*3/uL — ABNORMAL HIGH (ref 4.0–10.5)
nRBC: 0 % (ref 0.0–0.2)

## 2021-06-30 LAB — URINALYSIS, ROUTINE W REFLEX MICROSCOPIC
Bilirubin Urine: NEGATIVE
Glucose, UA: >=500 mg/dL — AB
Hgb urine dipstick: NEGATIVE
Ketones, ur: NEGATIVE mg/dL
Leukocytes,Ua: NEGATIVE
Nitrite: NEGATIVE
Protein, ur: NEGATIVE mg/dL
Specific Gravity, Urine: 1.007 (ref 1.005–1.030)
pH: 6 (ref 5.0–8.0)

## 2021-06-30 MED ORDER — DEXAMETHASONE SODIUM PHOSPHATE 10 MG/ML IJ SOLN
10.0000 mg | Freq: Once | INTRAMUSCULAR | Status: AC
  Administered 2021-06-30: 10 mg via INTRAVENOUS
  Filled 2021-06-30: qty 1

## 2021-06-30 MED ORDER — PREDNISONE 20 MG PO TABS: ORAL_TABLET | ORAL | 0 refills | Status: DC

## 2021-06-30 MED ORDER — MORPHINE SULFATE (PF) 4 MG/ML IV SOLN
4.0000 mg | Freq: Once | INTRAVENOUS | Status: AC
  Administered 2021-06-30: 4 mg via INTRAVENOUS
  Filled 2021-06-30: qty 1

## 2021-06-30 MED ORDER — LORAZEPAM 2 MG/ML IJ SOLN
1.0000 mg | INTRAMUSCULAR | Status: DC | PRN
  Administered 2021-06-30: 1 mg via INTRAVENOUS
  Filled 2021-06-30: qty 1

## 2021-06-30 MED ORDER — OXYCODONE-ACETAMINOPHEN 5-325 MG PO TABS: 1.0000 | ORAL_TABLET | Freq: Four times a day (QID) | ORAL | 0 refills | Status: DC | PRN

## 2021-06-30 MED ORDER — CYCLOBENZAPRINE HCL 10 MG PO TABS: 10.0000 mg | ORAL_TABLET | Freq: Two times a day (BID) | ORAL | 0 refills | Status: DC | PRN

## 2021-06-30 NOTE — ED Notes (Signed)
E-signature pad unavailable at time of pt discharge. This RN discussed discharge materials with pt and answered all pt questions. Pt stated understanding of discharge material. ? ?

## 2021-06-30 NOTE — ED Notes (Signed)
Pt called out to state that the pain in her leg has returned. Plan to inform provider, request pain medication, and ambulate pt after pain medication ?

## 2021-06-30 NOTE — ED Triage Notes (Signed)
Pt states she got an injection 2 weeks ago for L lower back pain that radiates to L leg.  Reports weakness in L lower leg x 2 weeks that is getting worse.  Reports 3 falls since getting injection.  Fell 2 times yesterday.  Denies any new injuries.  States she was trying to ambulate with her husband's walker at home but unable to ambulate due to L leg weakness. ?

## 2021-06-30 NOTE — ED Provider Notes (Signed)
?[facility] ?Provider Note ? ? ?CSN: [id number] ?Arrival date & time: 06/30/21  1502 ? ?  ? ?History ? ?Chief Complaint  ?Patient presents with  ? Back Pain  ? Extremity Weakness  ? ? ?Marissa Roberts is a 76 y.o. female. ? ?The history is provided by the patient and medical records. No language interpreter was used.  ?Back Pain ?Extremity Weakness ? ? ?76 year old female is in history of diabetes, hypertension, obesity, rheumatoid arthritis presented to the ED for evaluation of lower back pain and leg weakness.  Patient reports she has history of chronic back pain which radiates down to her left leg.  For the past several weeks and has become increasingly more painful in her lower back with increased legs weakness.  She was seen by her orthopedist provider, Dr. Rhona Raider 3 weeks ago for her complaint.  She had an MRI of her lumbar spine and subsequently had a epidural shot to help alleviate pain due to herniated disc.  She reported initially she noted some mild improvement however since then she has endorsed progressive worsening lower back pain as well as increased left leg weakness and numbness.  States that she has fallen several times when her left leg gave out on her.  She denies any injury from the fall denies any head or loss of consciousness.  She is not on any anticoagulant.  At this time she felt she cannot safely walk because of her leg weakness.  She does not complain of any fever or chills no nausea vomiting diarrhea no bowel bladder incontinence or saddle anesthesia and no rash.  She has been taking Tylenol as well as tramadol at home without adequate relief. ? ?Home Medications ?Prior to Admission medications   ?Medication Sig Start Date End Date Taking? Authorizing Provider  ?HUMIRA PEN 40 MG/0.4ML PNKT INJECT 1 PEN UNDER THE SKIN EVERY 14 DAYS. 06/07/21   Ofilia Neas, PA-C  ?amLODipine (NORVASC) 10 MG tablet Take 1 tablet by mouth once daily 04/27/21    [name], MD  ?BD PEN NEEDLE NANO U/F 32G X 4 MM MISC USE  4 TIMES DAILY TO  INJECT  INSULIN 02/05/21   Elayne Snare, MD  ?Biotin 10000 MCG TABS Take 10,000 mcg by mouth daily.    [provider]  ?Calcium Carb-Cholecalciferol (CALCIUM 600 + D PO) Take 2 tablets by mouth daily.    [provider]  ?cephALEXin (KEFLEX) 500 MG capsule TAKE 4 CAPSULES (2,000 MG) 30-60 MINUTES PRIOR TO DENTAL PROCEDURE 10/26/20   Magnant, Gerrianne Scale, PA-C  ?clotrimazole (LOTRIMIN) 1 % cream Apply to both feet and between toes twice daily for 6 weeks. 10/11/20   Marzetta Board, DPM  ?Continuous Blood Gluc Receiver ([place]) [place] For sensor monitoring 11/17/20   Elayne Snare, MD  ?Continuous Blood Gluc Sensor (DEXCOM G6 SENSOR) MISC Use to monitor blood sugar, change after 10 days 04/23/21   Elayne Snare, MD  ?Continuous Blood Gluc Transmit (DEXCOM G6 TRANSMITTER) MISC 4 PER YEAR 04/23/21   Elayne Snare, MD  ?dapagliflozin propanediol (FARXIGA) 10 MG TABS tablet Take 1 tablet (10 mg total) by mouth daily before breakfast. 01/19/21   Elayne Snare, MD  ?diclofenac Sodium (VOLTAREN) 1 % GEL Apply 2-4 grams to affected joint 4 times daily as needed. 09/18/20   Ofilia Neas, PA-C  ?fluticasone (FLONASE) 50 MCG/ACT nasal spray Place 2 sprays into both nostrils 2 (two) times daily as needed for allergies or  rhinitis.    [provider]  ?furosemide (LASIX) 40 MG tablet Take 2 tablets by mouth once daily 04/27/21   [name], MD  ?Glucosamine HCl-MSM (GLUCOSAMINE-MSM PO) Take 1 tablet by mouth daily.    [provider]  ?glucose blood (ONETOUCH VERIO) test strip CHECK BLOOD SUGAR 4 TIMES DAILY 05/05/20   Philemon Kingdom, MD  ?Lancets (ONETOUCH DELICA PLUS [id number]) [place] USE TO CHECK BLOOD SUGAR UP TO THREE TIMES A DAY 2 HOURS AFTER A MEAL AND UP TO 3 TIMES A WEEK UPON WAKING UP 07/17/20   Elayne Snare, MD  ?latanoprost (XALATAN) 0.005 % ophthalmic solution SMARTSIG:1 Drop(s) In  Eye(s) Every Evening 06/27/20   [provider]  ?leflunomide (ARAVA) 20 MG tablet Take 1 tablet by mouth once daily 04/27/21   Ofilia Neas, PA-C  ?levothyroxine (SYNTHROID) 112 MCG tablet TAKE 1 TABLET BY MOUTH BEFORE BREAKFAST 03/27/21   Elayne Snare, MD  ?linaclotide Rolan [place]) 290 MCG CAPS capsule Take 1 capsule (290 mcg total) by mouth daily as needed. ?Patient taking differently: Take 290 mcg by mouth daily as needed (constipation). 09/21/19   [name], MD  ?Magnesium 100 MG CAPS Take 1 capsule by mouth.    [provider]  ?metFORMIN (GLUCOPHAGE) 1000 MG tablet TAKE 1 TABLET BY MOUTH TWICE DAILY WITH A MEAL 03/20/21   Elayne Snare, MD  ?methocarbamol (ROBAXIN) 500 MG tablet Take 1 tablet (500 mg total) by mouth every 8 (eight) hours as needed for muscle spasms. 04/26/21   [name], MD  ?montelukast (SINGULAIR) 10 MG tablet TAKE 1 TABLET BY MOUTH AT BEDTIME 05/25/21   [name], MD  ?Multiple Vitamin (MULTIVITAMIN WITH MINERALS) TABS tablet Take 1 tablet by mouth daily.    [provider]  ?NOVOLOG FLEXPEN 100 UNIT/ML FlexPen INJECT 10 TO 15 UNITS SUBCUTANEOUSLY THREE TIMES DAILY BEFORE MEAL(S) 05/17/21   Elayne Snare, MD  ?omeprazole (PRILOSEC) 40 MG capsule Take 40 mg by mouth daily. 09/28/20   [provider]  ?potassium chloride (KLOR-CON) 10 MEQ tablet Take 1 tablet (10 mEq total) by mouth daily. 08/07/20   Elayne Snare, MD  ?rosuvastatin (CRESTOR) 40 MG tablet Take 1 tablet by mouth once daily 04/27/21   [name], MD  ?traMADol (ULTRAM) 50 MG tablet Take 1 tablet (50 mg total) by mouth daily as needed. 07/13/20 07/13/21  Magnant, Gerrianne Scale, PA-C  ?TRESIBA FLEXTOUCH 200 UNIT/ML FlexTouch Pen INJECT 36 UNITS SUBCUTANEOUSLY IN THE MORNING 06/19/21   Elayne Snare, MD  ?valsartan (DIOVAN) 320 MG tablet Take 1 tablet (320 mg total) by mouth daily. 05/24/21   Elayne Snare, MD  ?Saxagliptin-Metformin 07-998 MG TB24 Take 1 tablet by mouth daily.  08/14/12 04/19/13  Ricard Dillon, MD  ?   ? ?Allergies    ?Contrast media [iodinated contrast media], Adhesive [tape], Erythromycin, and Penicillins   ? ?Review of Systems   ?Review of Systems  ?Musculoskeletal:  Positive for back pain and extremity weakness.  ?All other systems reviewed and are negative. ? ?Physical Exam ?Updated Vital Signs ?BP (!) 148/67 (BP Location: Left Arm)   Pulse 79   Temp 98.4 ?F (36.9 ?C) (Oral)   Resp 16   SpO2 97%  ?Physical Exam ?Vitals and nursing note reviewed.  ?Constitutional:   ?   General: She is not in acute distress. ?   Appearance: She is well-developed. She is obese.  ?HENT:  ?   Head: Atraumatic.  ?Eyes:  ?  Conjunctiva/sclera: Conjunctivae normal.  ?Cardiovascular:  ?   Rate and Rhythm: Normal rate and regular rhythm.  ?   Pulses: Normal pulses.  ?   Heart sounds: Normal heart sounds.  ?Pulmonary:  ?   Effort: Pulmonary effort is normal.  ?Abdominal:  ?   Palpations: Abdomen is soft.  ?   Tenderness: There is no abdominal tenderness.  ?Musculoskeletal:     ?   General: Tenderness (Mild tenderness lumbar spine with left positive straight leg raise.  No overlying skin changes no step-off no crepitus) present.  ?   Cervical back: Neck supple.  ?Skin: ?   Capillary Refill: Capillary refill takes less than 2 seconds.  ?   Findings: No rash.  ?Neurological:  ?   Mental Status: She is alert.  ?   Comments: Right lower extremity: 5 out of 5 strength ?Left lower extremity: 3 out of 5 strength with normal dorsiflexion and plantarflexion and no foot drops.  ?Psychiatric:     ?   Mood and Affect: Mood normal.  ? ? ?ED Results / Procedures / Treatments   ?Labs ?(all labs ordered are listed, but only abnormal results are displayed) ?Labs Reviewed - No data to display ? ?EKG ?None ? ?Radiology ?No results found. ? ?Procedures ?Procedures  ? ? ?Medications Ordered in ED ?Medications - No data to display ? ?ED Course/ Medical Decision Making/ A&P ?  ?                        ?Medical  Decision Making ?Amount and/or Complexity of Data Reviewed ?Labs: ordered. ?Radiology: ordered. ? ?Risk ?Prescription drug management. ? ? ?BP (!) 148/67 (BP Location: Left Arm)   Pulse 79   Temp 98.4 ?F (36.9 ?C) Karin Lieu

## 2021-06-30 NOTE — ED Notes (Signed)
This RN and Laurence Ferrari, NT walked alongside pt as she ambulated with walker in room and into hallway. Pt did not require staff assistance for ambulation. Only c/o numbness in L leg and stated to this RN that the feeling did not increase will ambulation (but has been present since arrived to ED).  ?

## 2021-06-30 NOTE — Discharge Instructions (Signed)
Your back pain and leg discomfort is likely sciatica, pinched nerve.  You may take prednisone as prescribed but monitor your blood sugar closely as it may cause a rise in your blood sugar.  You may take Percocet when the pain is intense, you may take Flexeril to help with muscle spasm.  Follow-up closely with the orthopedist for further care.  Our Education officer, museum and physical therapist will reach out to you in the next few days to help provide additional support as needed. ?

## 2021-06-30 NOTE — ED Notes (Signed)
Pt to MRI

## 2021-07-02 ENCOUNTER — Other Ambulatory Visit: Payer: Self-pay | Admitting: Internal Medicine

## 2021-07-04 DIAGNOSIS — M5416 Radiculopathy, lumbar region: Secondary | ICD-10-CM | POA: Diagnosis not present

## 2021-07-12 ENCOUNTER — Encounter: Payer: Medicare Other | Admitting: Rheumatology

## 2021-07-12 ENCOUNTER — Ambulatory Visit: Payer: Self-pay

## 2021-07-13 NOTE — Progress Notes (Signed)
This encounter was created in error - please disregard.

## 2021-07-16 DIAGNOSIS — M25562 Pain in left knee: Secondary | ICD-10-CM | POA: Diagnosis not present

## 2021-07-25 DIAGNOSIS — E119 Type 2 diabetes mellitus without complications: Secondary | ICD-10-CM | POA: Diagnosis not present

## 2021-07-30 ENCOUNTER — Other Ambulatory Visit: Payer: Medicare Other

## 2021-07-30 ENCOUNTER — Other Ambulatory Visit (INDEPENDENT_AMBULATORY_CARE_PROVIDER_SITE_OTHER): Payer: Medicare Other

## 2021-07-30 DIAGNOSIS — E118 Type 2 diabetes mellitus with unspecified complications: Secondary | ICD-10-CM

## 2021-07-30 DIAGNOSIS — E871 Hypo-osmolality and hyponatremia: Secondary | ICD-10-CM

## 2021-07-30 LAB — COMPREHENSIVE METABOLIC PANEL
ALT: 13 U/L (ref 0–35)
AST: 17 U/L (ref 0–37)
Albumin: 4 g/dL (ref 3.5–5.2)
Alkaline Phosphatase: 45 U/L (ref 39–117)
BUN: 7 mg/dL (ref 6–23)
CO2: 29 mEq/L (ref 19–32)
Calcium: 9.6 mg/dL (ref 8.4–10.5)
Chloride: 101 mEq/L (ref 96–112)
Creatinine, Ser: 0.62 mg/dL (ref 0.40–1.20)
GFR: 88.03 mL/min (ref >60.00)
Glucose, Bld: 87 mg/dL (ref 70–99)
Potassium: 3.9 mEq/L (ref 3.5–5.1)
Sodium: 138 mEq/L (ref 135–145)
Total Bilirubin: 0.3 mg/dL (ref 0.2–1.2)
Total Protein: 6.9 g/dL (ref 6.0–8.3)

## 2021-07-31 ENCOUNTER — Encounter: Payer: Self-pay | Admitting: Podiatry

## 2021-07-31 ENCOUNTER — Ambulatory Visit (INDEPENDENT_AMBULATORY_CARE_PROVIDER_SITE_OTHER): Payer: Medicare Other | Admitting: Podiatry

## 2021-07-31 DIAGNOSIS — M79674 Pain in right toe(s): Secondary | ICD-10-CM

## 2021-07-31 DIAGNOSIS — Z794 Long term (current) use of insulin: Secondary | ICD-10-CM

## 2021-07-31 DIAGNOSIS — M79675 Pain in left toe(s): Secondary | ICD-10-CM

## 2021-07-31 DIAGNOSIS — B351 Tinea unguium: Secondary | ICD-10-CM

## 2021-07-31 DIAGNOSIS — Q828 Other specified congenital malformations of skin: Secondary | ICD-10-CM

## 2021-07-31 DIAGNOSIS — E119 Type 2 diabetes mellitus without complications: Secondary | ICD-10-CM

## 2021-08-01 ENCOUNTER — Other Ambulatory Visit: Payer: Self-pay

## 2021-08-01 ENCOUNTER — Encounter: Payer: Self-pay | Admitting: Endocrinology

## 2021-08-01 ENCOUNTER — Ambulatory Visit (INDEPENDENT_AMBULATORY_CARE_PROVIDER_SITE_OTHER): Payer: Medicare Other | Admitting: Endocrinology

## 2021-08-01 VITALS — BP 142/78 | HR 80 | Ht 65.0 in | Wt 198.6 lb

## 2021-08-01 DIAGNOSIS — E063 Autoimmune thyroiditis: Secondary | ICD-10-CM | POA: Diagnosis not present

## 2021-08-01 DIAGNOSIS — I10 Essential (primary) hypertension: Secondary | ICD-10-CM

## 2021-08-01 DIAGNOSIS — Z794 Long term (current) use of insulin: Secondary | ICD-10-CM | POA: Diagnosis not present

## 2021-08-01 DIAGNOSIS — E119 Type 2 diabetes mellitus without complications: Secondary | ICD-10-CM | POA: Diagnosis not present

## 2021-08-01 LAB — OSMOLALITY, URINE: Osmolality, Ur: 392 mOsmol/kg

## 2021-08-01 LAB — FRUCTOSAMINE: Fructosamine: 194 umol/L (ref 0–285)

## 2021-08-01 NOTE — Progress Notes (Signed)
Patient ID: Marissa Roberts, female   DOB: 1945/10/24, 76 y.o.   MRN: [id number] ? ?    ?       ? ? ?Reason for Appointment:  Follow-up for diabetes and hypertension ? ? ?History of Present Illness:  ? ?          ?Type 2 diabetes mellitus, date of diagnosis: 2005      ? ?INSULIN regimen is  Tresiba 30 units am, Novolog ?Recently ? ?Oral hypoglycemic drugs the patient is taking are: Metformin 2 g daily, Farxiga 5 mg daily ? ? ?Current blood sugar patterns, management and problems identified: ? ?Her A1c on the last visit was 7% ?Fructosamine is 194 ? ?She has lost a significant amount of weight again since her last visit apparently ?She thinks her appetite has been fairly good but is not eating as much overall ?Also continues on Farxiga ?Her blood sugars overall are excellent with 97% within target range on her sensor  ?Last night her blood sugar went up to 200 but this was likely from getting low protein with her carbohydrates  ?She is still trying to watch her diet but occasionally will have blood sugars over 200 based on what she is eating or drinking  ?She is not clear how to take her NovoLog and usually is not doing so because she feels that even 5 units will drop her sugar too low ?No hypoglycemia ?She is asking about hard areas on her arms where she is taking [place] ?She is still very inactive because of the left leg weakness ?    ?Dinner usually at 6-7pm ? ?Side effects from medications have been:none ? ?Analysis of the [place] download for the last 2 weeks as follows ? ?Her blood sugars are overall very consistently controlled with only occasional postprandial spikes  ?Blood sugars are mostly averaging close to 100 and stable overnight without any hypoglycemia ?Reviewed blood sugars also fairly close to 100 most of the time ?POSTPRANDIAL readings are within the target range except once in the second week but significantly higher about 3 times after lunch or dinner in the first week ?Most of the blood sugar  is in the evenings after dinner are fairly good except once in the last week ?No hypoglycemia ? ?CGM use % of time 93  ?2-week average/SD 17 versus 105  ?Time in range 97  ?% Time Above 180 3  ?% Time above 250   ?% Time Below 70 0  ? ? ?Self-care:  ? ?Meals: 3 meals per day. Breakfast is usually grits with or without eggs or meat ?Not always restricting fat intake or sweet tea  when eating out    ? ? ?Dietician visit, most recent:2014.              ? ?Weight history: ? ?Wt Readings from Last 3 Encounters:  ?08/01/21 198 lb 9.6 oz (90.1 kg)  ?05/30/21 213 lb 12.8 oz (97 kg)  ?04/26/21 227 lb 12.8 oz (103.3 kg)  ? ? ?Glycemic control: ?  ?Lab Results  ?Component Value Date  ? HGBA1C 7.0 (H) 05/18/2021  ? HGBA1C 6.5 01/18/2021  ? HGBA1C 6.5 10/24/2020  ? ?Lab Results  ?Component Value Date  ? MICROALBUR 9.7 (H) 05/30/2021  ? [place] 61 08/24/2019  ? CREATININE 0.62 07/30/2021  ? ?Lab Results  ?Component Value Date  ? FRUCTOSAMINE 194 07/30/2021  ?  ? ? ?Past history:  ?She thinks her blood sugars were only mildly increased at borderline  levels at the onsetper ?Most likely she was treated with metformin initially and this has been continued.   ?At some point she was also tried on [place] and [place] for improving her control. ?She is not sure why she was started on insulin 5 years ago, presumably for worsening hyperglycemia ?Also not clear if she has tried various insulin regimens before starting Lantus and NovoLog ? ?Previously had tried the V-go pump but subsequently she stopped doing the V-go pump because of the cost, skin irritation and local discomfort ? ? ?Allergies as of 08/01/2021   ? ?   Reactions  ? Contrast Media [iodinated Contrast Media]   ? Throat swells from ct contra  ? Adhesive [tape]   ? No paper tape, causes blisters  ? Erythromycin Rash  ? Penicillins Rash  ? Did it involve swelling of the face/tongue/throat, SOB, or low BP? No ?Did it involve sudden or severe rash/hives, skin peeling, or any  reaction on the inside of your mouth or nose? No ?Did you need to seek medical attention at a hospital or doctor's office? No ?When did it last happen?    Many years ?If all above answers are ?NO?, may proceed with cephalosporin use.  ? ?  ? ?  ?Medication List  ?  ? ?  ? Accurate as of Aug 01, 2021  9:46 AM. If you have any questions, ask your nurse or doctor.  ?  ?  ? ?  ? ?amLODipine 10 MG tablet ?Commonly known as: NORVASC ?Take 1 tablet by mouth once daily ?  ?BD Pen Needle Nano U/F 32G X 4 MM Misc ?Generic drug: Insulin Pen Needle ?USE  4 TIMES DAILY TO  INJECT  INSULIN ?  ?Biotin 10000 MCG Tabs ?Take 10,000 mcg by mouth daily. ?  ?CALCIUM 600 + D PO ?Take 2 tablets by mouth daily. ?  ?cephALEXin 500 MG capsule ?Commonly known as: KEFLEX ?TAKE 4 CAPSULES (2,000 MG) 30-60 MINUTES PRIOR TO DENTAL PROCEDURE ?  ?clotrimazole 1 % cream ?Commonly known as: LOTRIMIN ?Apply to both feet and between toes twice daily for 6 weeks. ?  ?cyclobenzaprine 5 MG tablet ?Commonly known as: FLEXERIL ?Take 5 mg by mouth at bedtime as needed. ?  ?cyclobenzaprine 10 MG tablet ?Commonly known as: FLEXERIL ?Take 1 tablet (10 mg total) by mouth 2 (two) times daily as needed for muscle spasms. ?  ?dapagliflozin propanediol 10 MG Tabs tablet ?Commonly known as: [place] ?Take 1 tablet (10 mg total) by mouth daily before breakfast. ?  ?Dexcom G6 Receiver Kerrin Mo ?For sensor monitoring ?  ?Dexcom G6 Sensor Misc ?Use to monitor blood sugar, change after 10 days ?  ?Dexcom G6 Transmitter Misc ?4 PER YEAR ?  ?diazepam 2 MG tablet ?Commonly known as: VALIUM ?SMARTSIG:1 Tablet(s) By Mouth ?  ?diclofenac Sodium 1 % Gel ?Commonly known as: VOLTAREN ?Apply 2-4 grams to affected joint 4 times daily as needed. ?  ?fluticasone 50 MCG/ACT nasal spray ?Commonly known as: FLONASE ?Place 2 sprays into both nostrils 2 (two) times daily as needed for allergies or rhinitis. ?  ?furosemide 40 MG tablet ?Commonly known as: LASIX ?Take 2 tablets by mouth once  daily ?  ?GLUCOSAMINE-MSM PO ?Take 1 tablet by mouth daily. ?  ?Humira Pen 40 MG/0.4ML Pnkt ?Generic drug: Adalimumab ?INJECT 1 PEN UNDER THE SKIN EVERY 14 DAYS. ?  ?latanoprost 0.005 % ophthalmic solution ?Commonly known as: XALATAN ?SMARTSIG:1 Drop(s) In Eye(s) Every Evening ?  ?leflunomide 20 MG tablet ?Commonly known  as: ARAVA ?Take 1 tablet by mouth once daily ?  ?levothyroxine 112 MCG tablet ?Commonly known as: SYNTHROID ?TAKE 1 TABLET BY MOUTH BEFORE BREAKFAST ?  ?Linzess 290 MCG Caps capsule ?Generic drug: linaclotide ?TAKE 1 CAPSULE BY MOUTH ONCE DAILY AS NEEDED ?  ?Magnesium 100 MG Caps ?Take 1 capsule by mouth. ?  ?metFORMIN 1000 MG tablet ?Commonly known as: GLUCOPHAGE ?TAKE 1 TABLET BY MOUTH TWICE DAILY WITH A MEAL ?  ?montelukast 10 MG tablet ?Commonly known as: SINGULAIR ?TAKE 1 TABLET BY MOUTH AT BEDTIME ?  ?multivitamin with minerals Tabs tablet ?Take 1 tablet by mouth daily. ?  ?NovoLOG FlexPen 100 UNIT/ML FlexPen ?Generic drug: insulin aspart ?INJECT 10 TO 15 UNITS SUBCUTANEOUSLY THREE TIMES DAILY BEFORE MEAL(S) ?  ?omeprazole 40 MG capsule ?Commonly known as: PRILOSEC ?Take 40 mg by mouth daily. ?  ?OneTouch Delica Plus [id number] Misc ?USE TO CHECK BLOOD SUGAR UP TO THREE TIMES A DAY 2 HOURS AFTER A MEAL AND UP TO 3 TIMES A WEEK UPON WAKING UP ?  ?OneTouch Verio test strip ?Generic drug: glucose blood ?CHECK BLOOD SUGAR 4 TIMES DAILY ?  ?oxyCODONE-acetaminophen 5-325 MG tablet ?Commonly known as: Percocet ?Take 1 tablet by mouth every 6 (six) hours as needed for moderate pain or severe pain. ?  ?potassium chloride 10 MEQ tablet ?Commonly known as: KLOR-CON ?Take 1 tablet (10 mEq total) by mouth daily. ?  ?predniSONE 20 MG tablet ?Commonly known as: DELTASONE ?3 tabs po day one, then 2 tabs daily x 4 days ?  ?rosuvastatin 40 MG tablet ?Commonly known as: CRESTOR ?Take 1 tablet by mouth once daily ?  ?Tyler Aas FlexTouch 200 UNIT/ML FlexTouch Pen ?Generic drug: insulin degludec ?INJECT 36 UNITS  SUBCUTANEOUSLY IN THE MORNING ?What changed: See the new instructions. ?  ?valsartan 320 MG tablet ?Commonly known as: DIOVAN ?Take 1 tablet (320 mg total) by mouth daily. ?  ? ?  ? ? ?Allergies:  ?Allergies  ?[place]

## 2021-08-06 ENCOUNTER — Other Ambulatory Visit: Payer: Self-pay | Admitting: Endocrinology

## 2021-08-06 ENCOUNTER — Other Ambulatory Visit: Payer: Self-pay | Admitting: Physician Assistant

## 2021-08-06 ENCOUNTER — Other Ambulatory Visit: Payer: Self-pay | Admitting: Internal Medicine

## 2021-08-06 ENCOUNTER — Other Ambulatory Visit: Payer: Self-pay | Admitting: Surgical

## 2021-08-06 NOTE — Telephone Encounter (Signed)
Next Visit: 09/18/2021 ?  ?Last Visit: 04/18/2021 ?  ?Last Fill: 04/27/2021 ? ?EO:FHQRFXJOIT arthritis involving multiple sites with positive rheumatoid factor  ?  ?Current Dose per office note 04/18/2021: Arava 20 mg 1 tablet by mouth daily ? ?Labs: 06/30/2021 CBC: WBC 11.0, Hgb 11.1, Hct 34.4, RDW 16.9, Monocytes Absolute 1.1 07/30/2021 CMP WNL ? ?Okay to refill Arava?  ?

## 2021-08-09 ENCOUNTER — Telehealth: Payer: Self-pay | Admitting: Orthopedic Surgery

## 2021-08-09 NOTE — Progress Notes (Signed)
?  Subjective:  ?Patient ID: Marissa Roberts, female    DOB: 1945/12/18,  MRN: [id number] ? ?Marissa Roberts presents to clinic today for preventative diabetic foot care and callus(es) b/l lower extremities and painful thick toenails that are difficult to trim. Painful toenails interfere with ambulation. Aggravating factors include wearing enclosed shoe gear. Pain is relieved with periodic professional debridement. Painful calluses are aggravated when weightbearing with and without shoegear. Pain is relieved with periodic professional debridement. ? ?Patient states blood glucose was 98 mg/dl today.   ? ?Last known HgA1c was in the 6% range. ? ?New problem(s): None.  ? ?PCP is [name], MD , and last visit was April 26, 2021. ? ?Allergies  ?Allergen Reactions  ? Contrast Media [Iodinated Contrast Media]   ?  Throat swells from ct contra  ? Adhesive [Tape]   ?  No paper tape, causes blisters  ? Erythromycin Rash  ? Penicillins Rash  ?  Did it involve swelling of the face/tongue/throat, SOB, or low BP? No ?Did it involve sudden or severe rash/hives, skin peeling, or any reaction on the inside of your mouth or nose? No ?Did you need to seek medical attention at a hospital or doctor's office? No ?When did it last happen?    Many years ?If all above answers are ?NO?, may proceed with cephalosporin use. ?  ? ? ?Review of Systems: Negative except as noted in the HPI. ? ?Objective: ? ?Vascular Examination: ?Capillary fill time to digits <3 seconds b/l lower extremities. Intact b/l with palpable pedal pulses. No edema. No pain with calf compression b/l. Skin temperature gradient WNL b/l.  ? ?Neurological Examination: ?Sensation grossly intact b/l with 10 gram monofilament. Vibratory sensation intact b/l.  ? ?Dermatological Examination: ?Skin warm and supple b/l lower extremities. Porokeratotic lesion(s) R 4th toe. No erythema, no edema, no drainage, no fluctuance. . Toenails 1-5 b/l thick, discolored,  elongated with subungual debris and pain on dorsal palpation.  ? ?Musculoskeletal Examination: ?Normal muscle strength 5/5 to all lower extremity muscle groups bilaterally. Hammertoe(s) noted to the L 5th toe and R 5th toe. ? ?Radiographs: None ? ?  Latest Ref Rng & Units 05/18/2021  ?  8:58 AM 01/18/2021  ?  9:18 AM 10/24/2020  ?  8:41 AM  ?Hemoglobin A1C  ?Hemoglobin-A1c 4.6 - 6.5 % 7.0   6.5   6.5    ? ?Assessment/Plan: ?1. Pain due to onychomycosis of toenails of both feet   ?2. Porokeratosis   ?3. Type 2 diabetes mellitus without complication, with long-term current use of insulin ([place])   ?  ?-Patient was evaluated and treated. All patient's and/or POA's questions/concerns answered on today's visit. ?-Continue foot and shoe inspections daily. Monitor blood glucose per PCP/Endocrinologist's recommendations. ?-Toenails 1-5 b/l were debrided in length and girth with sterile nail nippers and dremel without iatrogenic bleeding.  ?-Porokeratotic lesion(s) R 4th toe pared and enucleated with sterile scalpel blade without incident. Total number of lesions debrided=1. ?-Patient/POA to call should there be question/concern in the interim.  ? ?Return in about 9 weeks (around 10/02/2021). ? ?Marzetta Board, DPM  ?

## 2021-08-09 NOTE — Telephone Encounter (Signed)
Patient called in stating the medication that was prescribed for her dental procedure makes her break out and would like to be advised on what she should do. cephALEXin (KEFLEX) 500 MG capsule and she is allergic to amoxicillin ?

## 2021-08-14 NOTE — Telephone Encounter (Signed)
Can try doxycycline instead if she would like to do that in the future

## 2021-08-14 NOTE — Telephone Encounter (Signed)
Can you please help with dosing and sig? ?Looks like also doxy not reimbursed by patients insurance when trying to send it in ?

## 2021-08-20 ENCOUNTER — Ambulatory Visit: Payer: Medicare Other | Admitting: Dermatology

## 2021-08-20 DIAGNOSIS — G629 Polyneuropathy, unspecified: Secondary | ICD-10-CM | POA: Diagnosis not present

## 2021-08-24 DIAGNOSIS — E119 Type 2 diabetes mellitus without complications: Secondary | ICD-10-CM | POA: Diagnosis not present

## 2021-08-28 NOTE — Telephone Encounter (Signed)
Pt called and states she called her insurance company and they told her that she can get doxy under her prescription insurance.   CB [phone]

## 2021-08-29 MED ORDER — DOXYCYCLINE HYCLATE 100 MG PO CAPS: ORAL_CAPSULE | ORAL | 0 refills | Status: DC

## 2021-08-29 NOTE — Addendum Note (Signed)
Addended byLaurann Montana on: 08/29/2021 01:19 PM   Modules accepted: Orders

## 2021-08-29 NOTE — Telephone Encounter (Signed)
IC advised sent in doxy which was ok'd by Dr Marlou Sa.

## 2021-08-29 NOTE — Telephone Encounter (Signed)
Pt needs a call back regarding medication --please call

## 2021-08-31 ENCOUNTER — Other Ambulatory Visit: Payer: Self-pay | Admitting: Internal Medicine

## 2021-09-03 DIAGNOSIS — M5416 Radiculopathy, lumbar region: Secondary | ICD-10-CM | POA: Diagnosis not present

## 2021-09-05 NOTE — Progress Notes (Unsigned)
Office Visit Note  Patient: Marissa Roberts             Date of Birth: 15-Jan-1946           MRN: [id number]             PCP: [name], MD Referring: [name], * Visit Date: 09/18/2021 Occupation: '@GUAROCC'$ @  Subjective:  Discuss EMG results   History of Present Illness: Marissa Roberts is a 76 y.o. female with history of seropositive rheumatoid arthritis and osteoarthritis. She remains on Humira 40 mg sq injections every 14 days and Arava 20 mg 1 tablet by mouth daily.  Patient was initially started on Humira in December 2021.  She has been tolerating combination therapy and has not missed any doses recently.  She denies any signs or symptoms of a rheumatoid arthritis flare.  She states that she has been having difficulty with mobility as well as pain in her left lower extremity.  She was evaluated by Dr. Latanya Maudlin as a second opinion for the chronic pain in her left knee replacement performed by Dr. Marlou Sa. She had an EMG on 08/20/2021 and is currently awaiting an appointment with neurology for further evaluation. She denies any recent infections.     Activities of Daily Living:  Patient reports morning stiffness for 0  none .   Patient Reports nocturnal pain.  Difficulty dressing/grooming: Denies Difficulty climbing stairs: Reports Difficulty getting out of chair: Reports Difficulty using hands for taps, buttons, cutlery, and/or writing: Denies  Review of Systems  Constitutional:  Negative for fatigue.  HENT:  Negative for mouth sores, mouth dryness and nose dryness.   Eyes:  Negative for pain, visual disturbance and dryness.  Respiratory:  Negative for cough, hemoptysis, shortness of breath and difficulty breathing.   Cardiovascular:  Positive for swelling in legs/feet. Negative for chest pain, palpitations and hypertension.  Gastrointestinal:  Negative for blood in stool, constipation and diarrhea.  Endocrine: Negative for excessive thirst and increased  urination.  Genitourinary:  Negative for difficulty urinating and painful urination.  Musculoskeletal:  Positive for joint pain, gait problem, joint pain, joint swelling, muscle weakness and muscle tenderness. Negative for myalgias, morning stiffness and myalgias.  Skin:  Positive for rash. Negative for color change, pallor, hair loss, nodules/bumps, skin tightness, ulcers and sensitivity to sunlight.  Allergic/Immunologic: Negative for susceptible to infections.  Neurological:  Positive for numbness and weakness. Negative for dizziness and headaches.  Hematological:  Negative for bruising/bleeding tendency and swollen glands.  Psychiatric/Behavioral:  Negative for depressed mood and sleep disturbance. The patient is not nervous/anxious.     PMFS History:  Patient Active Problem List   Diagnosis Date Noted   Left leg pain 04/26/2021   Vaginal yeast infection 03/30/2021   Displaced transverse fracture of left patella, subsequent encounter for closed fracture with delayed healing    Arthritis of knee 05/23/2020   Pain in knee region after total knee replacement ([place]) 03/22/2020   Arthritis of left knee 07/01/2019   Hypercholesterolemia 04/20/2019   B12 deficiency 03/23/2019   Anemia 02/05/2019   Rheumatoid arthritis with rheumatoid factor of multiple sites without organ or systems involvement ([place]) 01/25/2019   High risk medication use 01/25/2019   Fatigue 09/18/2018   Encounter for general adult medical examination with abnormal findings 02/07/2015   Morbid obesity ([place]) 04/19/2013   Hyperlipidemia associated with type 2 diabetes mellitus ([place]) 07/28/2009   Osteopenia 03/14/2009   Diabetes mellitus type 2 with complications ([place]) 56/38/9373  Hypothyroidism 09/24/2006   Essential hypertension 09/24/2006    Past Medical History:  Diagnosis Date   Anemia    Arthritis    Bronchitis    hx of   Diabetes mellitus    type 2   Family history of adverse reaction to anesthesia    per  patient, "daughter threw up after a knee surgery"   GERD (gastroesophageal reflux disease)    Hypercholesteremia    Hypertension    Hypothyroidism    Pneumonia    hx of   PONV (postoperative nausea and vomiting)    Thyroid disease     Family History  Problem Relation Age of Onset   Diabetes Mother    Heart disease Mother    Hyperlipidemia Mother    Diabetes Father    Diabetes Sister    Hypertension Sister    Heart disease Sister    Dementia Sister    Heart disease Brother    Healthy Daughter    Healthy Daughter    Healthy Daughter    Healthy Son    Past Surgical History:  Procedure Laterality Date   ABDOMINAL HYSTERECTOMY     partial   BREAST SURGERY     lumpectomy left breast   carpel tunnel     COLONOSCOPY  07/2020   COLONOSCOPY W/ POLYPECTOMY     FOOT SURGERY     knee arhtroscopy     KNEE ARTHROPLASTY Left 05/2020   ORIF PATELLA Left 05/23/2020   Procedure: LEFT KNEE POLE PATELLA EXCISION;  Surgeon: [name], MD;  Location: [facility];  Service: Orthopedics;  Laterality: Left;   SHOULDER ARTHROSCOPY WITH SUBACROMIAL DECOMPRESSION, ROTATOR CUFF REPAIR AND BICEP TENDON REPAIR Right 05/26/2012   Procedure: Right Shoulder Diagnostic Operative Arthroscopy, Subacromial Decompression, Biceps Tenodesis, Mini Open Rotator Cuff Repair;  Surgeon: [name], MD;  Location: [facility];  Service: Orthopedics;  Laterality: Right;  Right Shoulder Diagnostic Operative Arthroscopy, Subacromial Decompression, Biceps Tenodesis, Mini Open Rotator Cuff Repair   TONSILLECTOMY     TOTAL KNEE ARTHROPLASTY Left 07/01/2019   Procedure: LEFT TOTAL KNEE ARTHROPLASTY;  Surgeon: [name], MD;  Location: [facility];  Service: Orthopedics;  Laterality: Left;   UPPER GI ENDOSCOPY  07/2020   Social History   Social History Narrative   Not on file   Immunization History  Administered Date(s) Administered   Fluad Quad(high Dose 65+) 01/02/2019, 12/31/2019   Influenza Split  01/03/2011   Influenza Whole 04/01/2005, 12/18/2009   Influenza, High Dose Seasonal PF 01/07/2014, 01/27/2015, 01/02/2016, 01/13/2017, 01/16/2018   Influenza-Unspecified 11/30/2012, 01/03/2021   PFIZER(Purple Top)SARS-COV-2 Vaccination 05/28/2019, 06/23/2019, 01/15/2020, 07/03/2020, 01/03/2021   Pneumococcal Conjugate-13 09/12/2016, 01/02/2019   Pneumococcal Polysaccharide-23 07/15/2014, 12/27/2019   Td 04/01/2001, 10/07/2013   Zoster Recombinat (Shingrix) 01/09/2021, 03/12/2021     Objective: Vital Signs: BP (!) 162/82 (BP Location: Left Arm, Patient Position: Sitting, Cuff Size: Small)   Pulse 76   Resp 12   Ht '5\' 5"'$  (1.651 m)   Wt 201 lb 6.4 oz (91.4 kg)   BMI 33.51 kg/m    Physical Exam Vitals and nursing note reviewed.  Constitutional:      Appearance: She is well-developed.  HENT:     Head: Normocephalic and atraumatic.  Eyes:     Conjunctiva/sclera: Conjunctivae normal.  Cardiovascular:     Rate and Rhythm: Normal rate and regular rhythm.     Heart sounds: Normal heart sounds.  Pulmonary:     Effort: Pulmonary effort is normal.  Breath sounds: Normal breath sounds.  Abdominal:     General: Bowel sounds are normal.     Palpations: Abdomen is soft.  Musculoskeletal:     Cervical back: Normal range of motion.  Skin:    General: Skin is warm and dry.     Capillary Refill: Capillary refill takes less than 2 seconds.  Neurological:     Mental Status: She is alert and oriented to person, place, and time.  Psychiatric:        Behavior: Behavior normal.      Musculoskeletal Exam: C-spine has good range of motion with no discomfort.  Shoulder joints, elbow joints, wrist joints, MCPs, PIPs, DIPs have good range of motion with no synovitis.  Complete fist formation bilaterally.  PIP and DIP thickening noted.  Hip joints have good range of motion with no groin pain.  Left knee replacement has limited extension with weakness in the left lower extremity.  Right knee joint  has good range of motion with no warmth or effusion.  No tenderness or synovitis of ankle joints.   CDAI Exam: CDAI Score: 0.4  Patient Global: 2 mm; Provider Global: 2 mm Swollen: 0 ; Tender: 0  Joint Exam 09/18/2021   No joint exam has been documented for this visit   There is currently no information documented on the homunculus. Go to the Rheumatology activity and complete the homunculus joint exam.  Investigation: No additional findings.  Imaging: No results found.  Recent Labs: Lab Results  Component Value Date   WBC 11.0 (H) 06/30/2021   HGB 11.9 (L) 06/30/2021   PLT 222 06/30/2021   NA 138 07/30/2021   K 3.9 07/30/2021   CL 101 07/30/2021   CO2 29 07/30/2021   GLUCOSE 87 07/30/2021   BUN 7 07/30/2021   CREATININE 0.62 07/30/2021   BILITOT 0.3 07/30/2021   ALKPHOS 45 07/30/2021   AST 17 07/30/2021   ALT 13 07/30/2021   PROT 6.9 07/30/2021   ALBUMIN 4.0 07/30/2021   CALCIUM 9.6 07/30/2021   GFRAA 101 09/18/2020   QFTBGOLDPLUS NEGATIVE 01/09/2021    Speciality Comments: PLQ Eye Exam: 12/22/2019 WNL @ [facility] follow up in 6 months  Methotrexate-hair loss  Procedures:  No procedures performed Allergies: Contrast media [iodinated contrast media], Adhesive [tape], Erythromycin, and Penicillins    Assessment / Plan:     Visit Diagnoses: Rheumatoid arthritis involving multiple sites with positive rheumatoid factor ([place]): She has no joint tenderness or synovitis on examination today.  She has not had any signs or symptoms of a rheumatoid arthritis flare.  She has clinically been doing well on Humira 40 mg subcutaneous injections once every 14 days and Arava 20 mg 1 tablet daily.  She has not missed any doses of these medications recently.  She has been on Humira since 03/09/2020.  She continues to find her rheumatoid arthritis to be well controlled on the current treatment regimen. 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 Reviewed results with Dr. Estanislado Pandy today.   Discussed with the patient the increased risk for developing demyelinating disorders on TNF inhibitors.  Typically the demyelinating disorders involve the central nervous system versus a peripheral nervous system but for now she was advised to hold Humira.  She has an upcoming appointment with neurology for further evaluation and plans on further discussing the use of humira in the future. She will remain on arava as prescribed.  If she develops  signs or symptoms of a flare we will need to discuss other treatment options for combination therapy-possible use of orencia may be an option in the future. She will follow up in 3 months.   High risk medication use - Arava 20 mg 1 tablet by mouth daily.  Advised to hold Humira until she has been further evaluated by the neurologist.   (Discontinued MTX due to hair loss, previously on PLQ).  - Plan: CBC with Differential/Platelet, COMPLETE METABOLIC PANEL WITH GFR Patient initiated Humira on 03/09/2020-advised to hold 09/18/21.  CBC updated on 06/30/21.  CMP updated on 07/30/21.  CBC and CMP were updated today to monitor for drug toxicity.  Her next lab work will be due in September and every 3 months. TB gold negative on 01/09/21 and will continue to be monitored yearly. Discussed the importance of holding humira and arava if she develops signs or symptoms of an infection and to resume once the infection has completely cleared.  Discussed the importance of yearly skin exams while on humira.   Trigger finger, left middle finger: Resolved with no recurrence.   Primary osteoarthritis of both hands: She has PIP and DIP thickening consistent with osteoarthritis of both hands.  No inflammation noted.   Trochanteric bursitis, left hip: She experiences intermittent discomfort in the lateral aspect of her left hip.  S/P total knee replacement, left - Performed by Dr. Marlou Sa on  07/01/19.  Bone scan of both lower extremity performed on 11/30/2020.  CT of the left knee ordered on 12/01/20: No hardware failure or complication.  Patient established care with Dr. Rhona Raider for a second opinion.  She underwent a EMG on 08/20/21-results discussed below. Currently awaiting an appointment with neurology.   Primary osteoarthritis of both feet: She is not experiencing any joint pain in her feet at this time.  She has no tenderness or synovitis of ankle joints.   Demyelinating neuropathy: Patient underwent an EMG on 08/20/2021: Abnormal study with electrophysiologic evidence of moderate sensory and motor axial and demyelinating peripheral neuropathy noted.  No evidence of myopathy.  She is currently experiencing weakness and pain in her left LE.  She has had difficulty ambulating and is using a cane for assistance.  Discussed the increased risk for developing demyelinating diseases while on TNF inhibitors including humira.  Dr. Rhona Raider has referred the patient to neurology for further evaluation and management.   History of osteopenia: No DEXA in epic to review. She is taking a calcium and vitamin D supplement daily.   Other medical conditions are listed as follows:   Diabetes mellitus type 2 with complications ([place])  Essential hypertension  Hyperlipidemia associated with type 2 diabetes mellitus ([place])  History of hypothyroidism  Orders: Orders Placed This Encounter  Procedures   CBC with Differential/Platelet   COMPLETE METABOLIC PANEL WITH GFR   No orders of the defined types were placed in this encounter.     Follow-Up Instructions: Return in about 3 months (around 12/19/2021) for Rheumatoid arthritis, Osteoarthritis.   Ofilia Neas, PA-C  Note - This record has been created using Dragon software.  Chart creation errors have been sought, but may not always  have been located. Such creation errors do not reflect on  the standard of medical care.

## 2021-09-07 ENCOUNTER — Encounter: Payer: Self-pay | Admitting: Endocrinology

## 2021-09-07 ENCOUNTER — Telehealth: Payer: Self-pay | Admitting: Rheumatology

## 2021-09-07 NOTE — Telephone Encounter (Signed)
Patient called the office stating her fingernails now look flat, wavy, and thick. Patient states she wants to know if this could be caused by the medications Dr. Estanislado Pandy has her on.

## 2021-09-07 NOTE — Telephone Encounter (Signed)
I called patient, patient verbalized understanding. 

## 2021-09-07 NOTE — Telephone Encounter (Signed)
Arava and Humira do not cause nail changes.

## 2021-09-18 ENCOUNTER — Ambulatory Visit (INDEPENDENT_AMBULATORY_CARE_PROVIDER_SITE_OTHER): Payer: Medicare Other | Admitting: Physician Assistant

## 2021-09-18 ENCOUNTER — Telehealth: Payer: Self-pay | Admitting: Physician Assistant

## 2021-09-18 ENCOUNTER — Encounter: Payer: Self-pay | Admitting: Physician Assistant

## 2021-09-18 VITALS — BP 162/82 | HR 76 | Resp 12 | Ht 65.0 in | Wt 201.4 lb

## 2021-09-18 DIAGNOSIS — M19041 Primary osteoarthritis, right hand: Secondary | ICD-10-CM | POA: Diagnosis not present

## 2021-09-18 DIAGNOSIS — Z8739 Personal history of other diseases of the musculoskeletal system and connective tissue: Secondary | ICD-10-CM

## 2021-09-18 DIAGNOSIS — M65332 Trigger finger, left middle finger: Secondary | ICD-10-CM | POA: Diagnosis not present

## 2021-09-18 DIAGNOSIS — E785 Hyperlipidemia, unspecified: Secondary | ICD-10-CM

## 2021-09-18 DIAGNOSIS — Z8639 Personal history of other endocrine, nutritional and metabolic disease: Secondary | ICD-10-CM

## 2021-09-18 DIAGNOSIS — E118 Type 2 diabetes mellitus with unspecified complications: Secondary | ICD-10-CM

## 2021-09-18 DIAGNOSIS — M0579 Rheumatoid arthritis with rheumatoid factor of multiple sites without organ or systems involvement: Secondary | ICD-10-CM | POA: Diagnosis not present

## 2021-09-18 DIAGNOSIS — M19042 Primary osteoarthritis, left hand: Secondary | ICD-10-CM

## 2021-09-18 DIAGNOSIS — M19072 Primary osteoarthritis, left ankle and foot: Secondary | ICD-10-CM

## 2021-09-18 DIAGNOSIS — Z79899 Other long term (current) drug therapy: Secondary | ICD-10-CM

## 2021-09-18 DIAGNOSIS — E1169 Type 2 diabetes mellitus with other specified complication: Secondary | ICD-10-CM

## 2021-09-18 DIAGNOSIS — I10 Essential (primary) hypertension: Secondary | ICD-10-CM

## 2021-09-18 DIAGNOSIS — M19071 Primary osteoarthritis, right ankle and foot: Secondary | ICD-10-CM

## 2021-09-18 DIAGNOSIS — M7062 Trochanteric bursitis, left hip: Secondary | ICD-10-CM

## 2021-09-18 DIAGNOSIS — Z96652 Presence of left artificial knee joint: Secondary | ICD-10-CM

## 2021-09-18 DIAGNOSIS — G629 Polyneuropathy, unspecified: Secondary | ICD-10-CM

## 2021-09-18 NOTE — Telephone Encounter (Signed)
I called the patient to advise her to hold Humira until she has seen the neurologist.  Her next dose of humira is not due until 09/27/21. She states that she will be seeing a neurologist on Friday since there was a cancellation.  She plans on having all information forwarded to our office to review.  She was encouraged to remain on [place] as prescribed.  She will notify us if she develops increased joint pain or joint swelling while on monotherapy.  Someone from our office will call her to schedule a follow-up visit in 3 months.  She will notify us if she has any flares prior to her scheduled follow-up visit.  All questions were addressed.  [name], PA-C

## 2021-09-19 LAB — COMPLETE METABOLIC PANEL WITH GFR
AG Ratio: 1.8 (calc) (ref 1.0–2.5)
ALT: 12 U/L (ref 6–29)
AST: 16 U/L (ref 10–35)
Albumin: 4.1 g/dL (ref 3.6–5.1)
Alkaline phosphatase (APISO): 49 U/L (ref 37–153)
BUN/Creatinine Ratio: 14 (calc) (ref 6–22)
BUN: 8 mg/dL (ref 7–25)
CO2: 27 mmol/L (ref 20–32)
Calcium: 9.6 mg/dL (ref 8.6–10.4)
Chloride: 105 mmol/L (ref 98–110)
Creat: 0.59 mg/dL — ABNORMAL LOW (ref 0.60–1.00)
Globulin: 2.3 g/dL (calc) (ref 1.9–3.7)
Glucose, Bld: 78 mg/dL (ref 65–99)
Potassium: 4 mmol/L (ref 3.5–5.3)
Sodium: 142 mmol/L (ref 135–146)
Total Bilirubin: 0.3 mg/dL (ref 0.2–1.2)
Total Protein: 6.4 g/dL (ref 6.1–8.1)
eGFR: 95 mL/min/{1.73_m2} (ref >=60)

## 2021-09-19 LAB — CBC WITH DIFFERENTIAL/PLATELET
Absolute Monocytes: 928 cells/uL (ref 200–950)
Basophils Absolute: 47 cells/uL (ref 0–200)
Basophils Relative: 0.6 %
Eosinophils Absolute: 78 cells/uL (ref 15–500)
Eosinophils Relative: 1 %
HCT: 36.1 % (ref 35.0–45.0)
Hemoglobin: 11.7 g/dL (ref 11.7–15.5)
Lymphs Abs: 3323 cells/uL (ref 850–3900)
MCH: 29.1 pg (ref 27.0–33.0)
MCHC: 32.4 g/dL (ref 32.0–36.0)
MCV: 89.8 fL (ref 80.0–100.0)
MPV: 11.9 fL (ref 7.5–12.5)
Monocytes Relative: 11.9 %
Neutro Abs: 3424 cells/uL (ref 1500–7800)
Neutrophils Relative %: 43.9 %
Platelets: 215 10*3/uL (ref 140–400)
RBC: 4.02 10*6/uL (ref 3.80–5.10)
RDW: 13 % (ref 11.0–15.0)
Total Lymphocyte: 42.6 %
WBC: 7.8 10*3/uL (ref 3.8–10.8)

## 2021-09-19 NOTE — Progress Notes (Signed)
CBC and CMP WNL

## 2021-09-21 DIAGNOSIS — R29898 Other symptoms and signs involving the musculoskeletal system: Secondary | ICD-10-CM | POA: Diagnosis not present

## 2021-09-21 DIAGNOSIS — M79605 Pain in left leg: Secondary | ICD-10-CM | POA: Diagnosis not present

## 2021-09-23 DIAGNOSIS — E119 Type 2 diabetes mellitus without complications: Secondary | ICD-10-CM | POA: Diagnosis not present

## 2021-09-26 ENCOUNTER — Telehealth: Payer: Self-pay | Admitting: Rheumatology

## 2021-09-26 NOTE — Telephone Encounter (Signed)
Patient called stating she had an appointment with the neurologist last week and he ordered new MRI's which are scheduled for 10/11/21.  Patient requested a return call to let her know if she needs to hold her Humira until after the MRI in July or take it tomorrow, 09/27/21.

## 2021-09-26 NOTE — Telephone Encounter (Signed)
Okay to continue to hold Humira until the work-up by her neurologist has been completed.

## 2021-09-27 ENCOUNTER — Encounter: Payer: Self-pay | Admitting: Internal Medicine

## 2021-09-27 ENCOUNTER — Ambulatory Visit (INDEPENDENT_AMBULATORY_CARE_PROVIDER_SITE_OTHER): Payer: Medicare Other | Admitting: Internal Medicine

## 2021-09-27 VITALS — BP 130/70 | HR 77 | Resp 18 | Ht 65.0 in | Wt 202.0 lb

## 2021-09-27 DIAGNOSIS — G629 Polyneuropathy, unspecified: Secondary | ICD-10-CM | POA: Diagnosis not present

## 2021-09-27 DIAGNOSIS — E118 Type 2 diabetes mellitus with unspecified complications: Secondary | ICD-10-CM | POA: Diagnosis not present

## 2021-09-27 DIAGNOSIS — B3731 Acute candidiasis of vulva and vagina: Secondary | ICD-10-CM

## 2021-09-27 DIAGNOSIS — M1712 Unilateral primary osteoarthritis, left knee: Secondary | ICD-10-CM

## 2021-09-27 DIAGNOSIS — E039 Hypothyroidism, unspecified: Secondary | ICD-10-CM

## 2021-09-27 DIAGNOSIS — R5383 Other fatigue: Secondary | ICD-10-CM

## 2021-09-27 LAB — TSH: TSH: 0.75 u[IU]/mL (ref 0.35–5.50)

## 2021-09-27 LAB — FERRITIN: Ferritin: 71.5 ng/mL (ref 10.0–291.0)

## 2021-09-27 LAB — VITAMIN D 25 HYDROXY (VIT D DEFICIENCY, FRACTURES): VITD: 47.09 ng/mL (ref 30.00–100.00)

## 2021-09-27 LAB — VITAMIN B12: Vitamin B-12: 234 pg/mL (ref 211–911)

## 2021-09-27 MED ORDER — FLUCONAZOLE 150 MG PO TABS: 150.0000 mg | ORAL_TABLET | ORAL | 3 refills | Status: DC

## 2021-09-27 NOTE — Patient Instructions (Signed)
We will check the labs today and see what the MRI shows.

## 2021-09-27 NOTE — Telephone Encounter (Signed)
Attempted to contact the patient and left message for patient to call the office.  

## 2021-09-27 NOTE — Telephone Encounter (Signed)
Patient advised okay to continue to hold Humira until the work-up by her neurologist has been completed.

## 2021-09-27 NOTE — Progress Notes (Signed)
   Subjective:   Patient ID: Marissa Roberts, female    DOB: 10/09/1945, 77 y.o.   MRN: [id number]  HPI The patient is a 76 YO female coming in to discuss recent findings and evaluation by other providers related to her left leg pain and weakness. Several falls related to this but not recently.   Review of Systems  Constitutional: Negative.   HENT: Negative.    Eyes: Negative.   Respiratory:  Negative for cough, chest tightness and shortness of breath.   Cardiovascular:  Negative for chest pain, palpitations and leg swelling.  Gastrointestinal:  Negative for abdominal distention, abdominal pain, constipation, diarrhea, nausea and vomiting.  Musculoskeletal:  Positive for arthralgias, gait problem and myalgias.  Skin: Negative.   Neurological:  Positive for weakness.  Psychiatric/Behavioral: Negative.      Objective:  Physical Exam Constitutional:      Appearance: She is well-developed. She is obese.  HENT:     Head: Normocephalic and atraumatic.  Cardiovascular:     Rate and Rhythm: Normal rate and regular rhythm.  Pulmonary:     Effort: Pulmonary effort is normal. No respiratory distress.     Breath sounds: Normal breath sounds. No wheezing or rales.  Abdominal:     General: Bowel sounds are normal. There is no distension.     Palpations: Abdomen is soft.     Tenderness: There is no abdominal tenderness. There is no rebound.  Musculoskeletal:        General: Tenderness present.     Cervical back: Normal range of motion.  Skin:    General: Skin is warm and dry.  Neurological:     Mental Status: She is alert and oriented to person, place, and time.     Coordination: Coordination normal.     Vitals:   09/27/21 0927  BP: 130/70  Pulse: 77  Resp: 18  SpO2: 96%  Weight: 202 lb (91.6 kg)  Height: '5\' 5"'$  (1.651 m)    Assessment & Plan:  Visit time 25 minutes in face to face communication with patient and coordination of care, additional 10 minutes spent in record  review, coordination or care, ordering tests, communicating/referring to other healthcare professionals, documenting in medical records all on the same day of the visit for total time 35 minutes spent on the visit.

## 2021-09-28 ENCOUNTER — Encounter: Payer: Self-pay | Admitting: Internal Medicine

## 2021-09-28 DIAGNOSIS — H401131 Primary open-angle glaucoma, bilateral, mild stage: Secondary | ICD-10-CM | POA: Diagnosis not present

## 2021-09-28 NOTE — Assessment & Plan Note (Signed)
Checking TSH and vitamin B12 and D and ferritin and adjust as needed.

## 2021-09-28 NOTE — Assessment & Plan Note (Signed)
Reviewed recent nerve conduction testing with her showing neuropathy in both legs about equal however she is having symptoms of weakness only in left leg making some problem with knee surgery possible. She is getting MRI in about 2 weeks and will follow results.

## 2021-09-28 NOTE — Assessment & Plan Note (Signed)
Checking TSH due to neuropathy to ensure appropriate adjust levothyroxine 112 mcg daily as needed.

## 2021-09-28 NOTE — Assessment & Plan Note (Signed)
Foot exam done, HgA1c done recently with endo and no adjustment to medication today.

## 2021-09-28 NOTE — Assessment & Plan Note (Signed)
Rx diflucan treatment for 2 weeks given DM for persistent yeast infection. If no resolution she will let us know.

## 2021-09-29 ENCOUNTER — Other Ambulatory Visit: Payer: Self-pay | Admitting: Endocrinology

## 2021-09-29 DIAGNOSIS — E063 Autoimmune thyroiditis: Secondary | ICD-10-CM

## 2021-10-04 ENCOUNTER — Ambulatory Visit (INDEPENDENT_AMBULATORY_CARE_PROVIDER_SITE_OTHER): Payer: Medicare Other

## 2021-10-04 ENCOUNTER — Ambulatory Visit: Payer: Medicare Other

## 2021-10-04 DIAGNOSIS — E538 Deficiency of other specified B group vitamins: Secondary | ICD-10-CM

## 2021-10-04 MED ORDER — CYANOCOBALAMIN 1000 MCG/ML IJ SOLN
1000.0000 ug | Freq: Once | INTRAMUSCULAR | Status: AC
  Administered 2021-10-04: 1000 ug via INTRAMUSCULAR

## 2021-10-04 NOTE — Progress Notes (Signed)
Patient here for B12 injection per Dr. Sharlet Salina. B12 1000 mcg given IM in right arm and patient tolerated injection well today.

## 2021-10-05 ENCOUNTER — Other Ambulatory Visit: Payer: Self-pay | Admitting: Endocrinology

## 2021-10-05 ENCOUNTER — Other Ambulatory Visit: Payer: Self-pay | Admitting: Internal Medicine

## 2021-10-09 ENCOUNTER — Encounter: Payer: Self-pay | Admitting: Podiatry

## 2021-10-09 ENCOUNTER — Ambulatory Visit (INDEPENDENT_AMBULATORY_CARE_PROVIDER_SITE_OTHER): Payer: Medicare Other | Admitting: Podiatry

## 2021-10-09 DIAGNOSIS — Q828 Other specified congenital malformations of skin: Secondary | ICD-10-CM | POA: Diagnosis not present

## 2021-10-09 DIAGNOSIS — M79675 Pain in left toe(s): Secondary | ICD-10-CM

## 2021-10-09 DIAGNOSIS — E119 Type 2 diabetes mellitus without complications: Secondary | ICD-10-CM | POA: Diagnosis not present

## 2021-10-09 DIAGNOSIS — Z794 Long term (current) use of insulin: Secondary | ICD-10-CM

## 2021-10-09 DIAGNOSIS — B351 Tinea unguium: Secondary | ICD-10-CM

## 2021-10-09 DIAGNOSIS — M79674 Pain in right toe(s): Secondary | ICD-10-CM

## 2021-10-11 DIAGNOSIS — E119 Type 2 diabetes mellitus without complications: Secondary | ICD-10-CM | POA: Diagnosis not present

## 2021-10-11 DIAGNOSIS — Z96642 Presence of left artificial hip joint: Secondary | ICD-10-CM | POA: Diagnosis not present

## 2021-10-11 DIAGNOSIS — M461 Sacroiliitis, not elsewhere classified: Secondary | ICD-10-CM | POA: Diagnosis not present

## 2021-10-11 DIAGNOSIS — M62552 Muscle wasting and atrophy, not elsewhere classified, left thigh: Secondary | ICD-10-CM | POA: Diagnosis not present

## 2021-10-11 DIAGNOSIS — M79605 Pain in left leg: Secondary | ICD-10-CM | POA: Diagnosis not present

## 2021-10-11 DIAGNOSIS — M47896 Other spondylosis, lumbar region: Secondary | ICD-10-CM | POA: Diagnosis not present

## 2021-10-11 DIAGNOSIS — K5730 Diverticulosis of large intestine without perforation or abscess without bleeding: Secondary | ICD-10-CM | POA: Diagnosis not present

## 2021-10-11 DIAGNOSIS — S76312A Strain of muscle, fascia and tendon of the posterior muscle group at thigh level, left thigh, initial encounter: Secondary | ICD-10-CM | POA: Diagnosis not present

## 2021-10-11 DIAGNOSIS — R29898 Other symptoms and signs involving the musculoskeletal system: Secondary | ICD-10-CM | POA: Diagnosis not present

## 2021-10-11 DIAGNOSIS — M79652 Pain in left thigh: Secondary | ICD-10-CM | POA: Diagnosis not present

## 2021-10-11 DIAGNOSIS — R531 Weakness: Secondary | ICD-10-CM | POA: Diagnosis not present

## 2021-10-14 NOTE — Progress Notes (Signed)
  Subjective:  Patient ID: Marissa Roberts, female    DOB: 14-Jun-1945,  MRN: [id number]  Marissa Roberts presents to clinic today for preventative diabetic foot care and painful porokeratotic lesion(s) right lower extremity and painful mycotic toenails that limit ambulation. Painful toenails interfere with ambulation. Aggravating factors include wearing enclosed shoe gear. Pain is relieved with periodic professional debridement. Painful porokeratotic lesions are aggravated when weightbearing with and without shoegear. Pain is relieved with periodic professional debridement.  Patient states blood glucose was 110 mg/dl today.  Last A1c was around 6% range.  She continues to have neuropathy symptoms, moreso in her LLE. She is following up with her Orthopedic MD for her recent left knee surgery.   PCP is Marissa Koch, MD , and last visit was  September 27, 2021  Allergies  Allergen Reactions   Contrast Media [Iodinated Contrast Media]     Throat swells from ct contra   Adhesive [Tape]     No paper tape, causes blisters   Erythromycin Rash   Penicillins Rash    Did it involve swelling of the face/tongue/throat, SOB, or low BP? No Did it involve sudden or severe rash/hives, skin peeling, or any reaction on the inside of your mouth or nose? No Did you need to seek medical attention at a hospital or doctor's office? No When did it last happen?    Many years If all above answers are "NO", may proceed with cephalosporin use.     Review of Systems: Negative except as noted in the HPI.  Objective: No changes noted in today's physical examination. Marissa Roberts is a pleasant 75 y.o. female in NAD. AAO x 3.  Vascular Examination: Capillary fill time to digits <3 seconds b/l lower extremities. Intact b/l with palpable pedal pulses. No edema. No pain with calf compression b/l. Skin temperature gradient WNL b/l.   Neurological Examination: Sensation grossly intact b/l with 10 gram  monofilament. Vibratory sensation intact b/l. Pt has subjective symptoms of neuropathy.  Dermatological Examination: Skin warm and supple b/l lower extremities. Porokeratotic lesion(s) R 4th toe. No erythema, no edema, no drainage, no fluctuance. . Toenails 1-5 b/l thick, discolored, elongated with subungual debris and pain on dorsal palpation.   Musculoskeletal Examination: Normal muscle strength 5/5 to all lower extremity muscle groups bilaterally. Hammertoe(s) noted to the L 5th toe and R 5th toe.    Latest Ref Rng & Units 05/18/2021    8:58 AM 01/18/2021    9:18 AM 10/24/2020    8:41 AM  Hemoglobin A1C  Hemoglobin-A1c 4.6 - 6.5 % 7.0  6.5  6.5    Assessment/Plan: 1. Pain due to onychomycosis of toenails of both feet   2. Porokeratosis   3. Type 2 diabetes mellitus without complication, with long-term current use of insulin (Marissa Roberts)      -Examined patient. -Patient to continue soft, supportive shoe gear daily. -Mycotic toenails 1-5 bilaterally were debrided in length and girth with sterile nail nippers and dremel without incident. -Porokeratotic lesion(s) R 4th toe pared and enucleated with sterile scalpel blade without incident. Total number of lesions debrided=1. -Patient/POA to call should there be question/concern in the interim.   Return in about 9 weeks (around 12/11/2021).  Marissa Roberts

## 2021-10-17 ENCOUNTER — Other Ambulatory Visit: Payer: Self-pay | Admitting: Endocrinology

## 2021-10-18 ENCOUNTER — Ambulatory Visit (INDEPENDENT_AMBULATORY_CARE_PROVIDER_SITE_OTHER): Payer: Medicare Other

## 2021-10-18 DIAGNOSIS — E538 Deficiency of other specified B group vitamins: Secondary | ICD-10-CM | POA: Diagnosis not present

## 2021-10-18 MED ORDER — CYANOCOBALAMIN 1000 MCG/ML IJ SOLN
1000.0000 ug | Freq: Once | INTRAMUSCULAR | Status: AC
  Administered 2021-10-18: 1000 ug via INTRAMUSCULAR

## 2021-10-18 NOTE — Progress Notes (Signed)
After obtaining consent, and per orders of Dr. Sharlet Salina, injection of b12 given the right deltoid by Marrian Salvage. Patient tolerated well and instructed to report any adverse reaction to me immediately.

## 2021-10-19 DIAGNOSIS — E1144 Type 2 diabetes mellitus with diabetic amyotrophy: Secondary | ICD-10-CM | POA: Diagnosis not present

## 2021-10-23 DIAGNOSIS — E119 Type 2 diabetes mellitus without complications: Secondary | ICD-10-CM | POA: Diagnosis not present

## 2021-10-29 ENCOUNTER — Other Ambulatory Visit: Payer: Self-pay | Admitting: Internal Medicine

## 2021-10-29 ENCOUNTER — Other Ambulatory Visit (INDEPENDENT_AMBULATORY_CARE_PROVIDER_SITE_OTHER): Payer: Medicare Other

## 2021-10-29 DIAGNOSIS — Z794 Long term (current) use of insulin: Secondary | ICD-10-CM

## 2021-10-29 DIAGNOSIS — E063 Autoimmune thyroiditis: Secondary | ICD-10-CM | POA: Diagnosis not present

## 2021-10-29 DIAGNOSIS — E119 Type 2 diabetes mellitus without complications: Secondary | ICD-10-CM | POA: Diagnosis not present

## 2021-10-29 LAB — BASIC METABOLIC PANEL
BUN: 12 mg/dL (ref 6–23)
CO2: 27 mEq/L (ref 19–32)
Calcium: 9.8 mg/dL (ref 8.4–10.5)
Chloride: 105 mEq/L (ref 96–112)
Creatinine, Ser: 0.64 mg/dL (ref 0.40–1.20)
GFR: 87.21 mL/min (ref >60.00)
Glucose, Bld: 74 mg/dL (ref 70–99)
Potassium: 4.2 mEq/L (ref 3.5–5.1)
Sodium: 142 mEq/L (ref 135–145)

## 2021-10-29 LAB — T4, FREE: Free T4: 1.12 ng/dL (ref 0.60–1.60)

## 2021-10-29 LAB — TSH: TSH: 1.96 u[IU]/mL (ref 0.35–5.50)

## 2021-10-29 LAB — HEMOGLOBIN A1C: Hgb A1c MFr Bld: 6.1 % (ref 4.6–6.5)

## 2021-10-30 ENCOUNTER — Other Ambulatory Visit: Payer: Self-pay | Admitting: Internal Medicine

## 2021-11-01 ENCOUNTER — Ambulatory Visit (INDEPENDENT_AMBULATORY_CARE_PROVIDER_SITE_OTHER): Payer: Medicare Other | Admitting: Endocrinology

## 2021-11-01 ENCOUNTER — Encounter: Payer: Self-pay | Admitting: Endocrinology

## 2021-11-01 ENCOUNTER — Ambulatory Visit (INDEPENDENT_AMBULATORY_CARE_PROVIDER_SITE_OTHER): Payer: Medicare Other | Admitting: *Deleted

## 2021-11-01 VITALS — BP 132/64 | HR 77 | Ht 64.75 in | Wt 207.2 lb

## 2021-11-01 DIAGNOSIS — E063 Autoimmune thyroiditis: Secondary | ICD-10-CM

## 2021-11-01 DIAGNOSIS — E1165 Type 2 diabetes mellitus with hyperglycemia: Secondary | ICD-10-CM

## 2021-11-01 DIAGNOSIS — Z794 Long term (current) use of insulin: Secondary | ICD-10-CM

## 2021-11-01 DIAGNOSIS — E538 Deficiency of other specified B group vitamins: Secondary | ICD-10-CM

## 2021-11-01 DIAGNOSIS — I10 Essential (primary) hypertension: Secondary | ICD-10-CM | POA: Diagnosis not present

## 2021-11-01 MED ORDER — CYANOCOBALAMIN 1000 MCG/ML IJ SOLN
1000.0000 ug | Freq: Once | INTRAMUSCULAR | Status: AC
  Administered 2021-11-01: 1000 ug via INTRAMUSCULAR

## 2021-11-01 NOTE — Progress Notes (Signed)
Patient ID: Marissa Roberts, female   DOB: 20-Oct-1945, 76 y.o.   MRN: [id number]               Reason for Appointment:  Follow-up for endocrine problems   History of Present Illness:             Type 2 diabetes mellitus, date of diagnosis: 2005       INSULIN regimen is  Tresiba 32 units am, Novolog rarely  Oral hypoglycemic drugs the patient is taking are: Metformin 2 g daily, Farxiga 5 mg daily   Current blood sugar patterns, management and problems identified:  Her A1c is down to 6.1, on the last visit was 7% Fructosamine last 194  She previously had lost a significant amount of weight and this appears to be leveled off, current fluctuation likely to be from fluid shift  Her blood sugars are excellent overall and is seen in the Dexcom interpretation below may be low normal overnight at times Only rarely will have a high reading after lunch depending on her diet or drinking sweet tea She may take NovoLog if she is starting to have sweet tea but still does not have control with those meals Generally maintaining her portions and limiting high calorie foods and carbs As before is not able to do much physical activity Not clear what dose of Tyler Aas she is taking, dose was not changed on the last visit     Dinner usually at 6-7pm  Side effects from medications have been:none  Analysis of the [facility] G6 download for the last 2 weeks as follows  Her blood sugars are the lowest overnight between about midnight-6 AM and then overall gradually rising  She has more variability in the midday and afternoon with some tendency to high readings with occasional postprandial spikes However generally maintains consistently control with HIGHEST average blood sugar 137 at 6 PM  Overnight blood sugars as above are fairly good with periodic readings in the 70 range and lowest 67   POSTPRANDIAL readings are only rising excessively on a couple of occasions after lunch or late afternoon but not  after dinner No rise in blood sugar after breakfast No hypoglycemia  CGM use % of time 93  2-week average/GV 110  Time in range   97     %  % Time Above 180 2  % Time above 250   % Time Below 70 1     PRE-MEAL Fasting Lunch Dinner Bedtime Overall  Glucose range: 94      Averages:        Previously:   CGM use % of time 93  2-week average/SD 17 versus 105  Time in range 97  % Time Above 180 3  % Time above 250   % Time Below 70 0    Self-care:   Meals: 3 meals per day. Breakfast is usually grits with or without eggs or meat Not always restricting fat intake or sweet tea  when eating out      Dietician visit, most recent:2014.               Weight history:  Wt Readings from Last 3 Encounters:  11/01/21 207 lb 3.2 oz (94 kg)  09/27/21 202 lb (91.6 kg)  09/18/21 201 lb 6.4 oz (91.4 kg)    Glycemic control:   Lab Results  Component Value Date   HGBA1C 6.1 10/29/2021   HGBA1C 7.0 (H) 05/18/2021   HGBA1C 6.5 01/18/2021  Lab Results  Component Value Date   MICROALBUR 9.7 (H) 05/30/2021   LDLCALC 61 08/24/2019   CREATININE 0.64 10/29/2021   Lab Results  Component Value Date   FRUCTOSAMINE 194 07/30/2021      Past history:  She thinks her blood sugars were only mildly increased at borderline levels at the onsetper Most likely she was treated with metformin initially and this has been continued.   At some point she was also tried on [place] and [place] for improving her control. She is not sure why she was started on insulin 5 years ago, presumably for worsening hyperglycemia Also not clear if she has tried various insulin regimens before starting Lantus and NovoLog  Previously had tried the V-go pump but subsequently she stopped doing the V-go pump because of the cost, skin irritation and local discomfort   Allergies as of 11/01/2021       Reactions   Contrast Media [iodinated Contrast Media]    Throat swells from ct contra   Adhesive [tape]    No  paper tape, causes blisters   Erythromycin Rash   Penicillins Rash   Did it involve swelling of the face/tongue/throat, SOB, or low BP? No Did it involve sudden or severe rash/hives, skin peeling, or any reaction on the inside of your mouth or nose? No Did you need to seek medical attention at a hospital or doctor's office? No When did it last happen?    Many years If all above answers are "NO", may proceed with cephalosporin use.        Medication List        Accurate as of November 01, 2021 10:24 AM. If you have any questions, ask your nurse or doctor.          amLODipine 10 MG tablet Commonly known as: NORVASC Take 1 tablet by mouth once daily   BD Pen Needle Nano 2nd Gen 32G X 4 MM Misc Generic drug: Insulin Pen Needle USE  4 TIMES DAILY TO  INJECT  INSULIN   Biotin 10000 MCG Tabs Take 10,000 mcg by mouth daily.   CALCIUM 600 + D PO Take 2 tablets by mouth daily.   cephALEXin 500 MG capsule Commonly known as: KEFLEX TAKE 4 CAPSULES BY MOUTH 30 TO 60 MINUTES PRIOR TO DENTAL PROCEDURE   clotrimazole 1 % cream Commonly known as: LOTRIMIN Apply to both feet and between toes twice daily for 6 weeks.   cyclobenzaprine 5 MG tablet Commonly known as: FLEXERIL Take 5 mg by mouth at bedtime as needed.   cyclobenzaprine 10 MG tablet Commonly known as: FLEXERIL Take 1 tablet (10 mg total) by mouth 2 (two) times daily as needed for muscle spasms.   dapagliflozin propanediol 10 MG Tabs tablet Commonly known as: Farxiga Take 1 tablet (10 mg total) by mouth daily before breakfast.   Dexcom G6 Receiver Devi For sensor monitoring   Dexcom G6 Sensor Misc Use to monitor blood sugar, change after 10 days   Dexcom G6 Transmitter Misc 4 PER YEAR   diazepam 2 MG tablet Commonly known as: VALIUM   diclofenac Sodium 1 % Gel Commonly known as: VOLTAREN Apply 2-4 grams to affected joint 4 times daily as needed.   doxycycline 100 MG capsule Commonly known as:  VIBRAMYCIN 2 po 1 hour prior to dental procedure   fluconazole 150 MG tablet Commonly known as: DIFLUCAN Take 1 tablet (150 mg total) by mouth every 3 (three) days.   fluticasone 50 MCG/ACT nasal  spray Commonly known as: FLONASE Place 2 sprays into both nostrils 2 (two) times daily as needed for allergies or rhinitis.   furosemide 40 MG tablet Commonly known as: LASIX Take 2 tablets by mouth once daily   GLUCOSAMINE-MSM PO Take 1 tablet by mouth daily.   Humira Pen 40 MG/0.4ML Pnkt Generic drug: Adalimumab INJECT 1 PEN UNDER THE SKIN EVERY 14 DAYS.   latanoprost 0.005 % ophthalmic solution Commonly known as: XALATAN SMARTSIG:1 Drop(s) In Eye(s) Every Evening   leflunomide 20 MG tablet Commonly known as: ARAVA Take 1 tablet by mouth once daily   levothyroxine 112 MCG tablet Commonly known as: SYNTHROID TAKE 1 TABLET BY MOUTH BEFORE BREAKFAST   Linzess 290 MCG Caps capsule Generic drug: linaclotide TAKE 1 CAPSULE BY MOUTH ONCE DAILY AS NEEDED   Magnesium 100 MG Caps Take 1 capsule by mouth.   metFORMIN 1000 MG tablet Commonly known as: GLUCOPHAGE TAKE 1 TABLET BY MOUTH TWICE DAILY WITH A MEAL   montelukast 10 MG tablet Commonly known as: SINGULAIR TAKE 1 TABLET BY MOUTH AT BEDTIME   multivitamin with minerals Tabs tablet Take 1 tablet by mouth daily.   NovoLOG FlexPen 100 UNIT/ML FlexPen Generic drug: insulin aspart INJECT 10 TO 15 UNITS SUBCUTANEOUSLY THREE TIMES DAILY BEFORE MEAL(S)   omeprazole 40 MG capsule Commonly known as: PRILOSEC Take 40 mg by mouth daily.   OneTouch Delica Plus [id number] Misc USE TO CHECK BLOOD SUGAR UP TO THREE TIMES A DAY 2 HOURS AFTER A MEAL AND UP TO 3 TIMES A WEEK UPON WAKING UP   OneTouch Verio test strip Generic drug: glucose blood CHECK BLOOD SUGAR 4 TIMES DAILY   oxyCODONE-acetaminophen 5-325 MG tablet Commonly known as: Percocet Take 1 tablet by mouth every 6 (six) hours as needed for moderate pain or severe  pain.   potassium chloride 10 MEQ tablet Commonly known as: KLOR-CON TAKE 1  BY MOUTH ONCE DAILY   predniSONE 20 MG tablet Commonly known as: DELTASONE 3 tabs po day one, then 2 tabs daily x 4 days   pregabalin 50 MG capsule Commonly known as: LYRICA Take 50 mg by mouth 2 (two) times daily.   rosuvastatin 40 MG tablet Commonly known as: CRESTOR Take 1 tablet by mouth once daily   traMADol 50 MG tablet Commonly known as: ULTRAM Take 50 mg by mouth 3 (three) times daily as needed.   Tyler Aas FlexTouch 200 UNIT/ML FlexTouch Pen Generic drug: insulin degludec INJECT 36 UNITS SUBCUTANEOUSLY IN THE MORNING   valsartan 320 MG tablet Commonly known as: DIOVAN Take 1 tablet (320 mg total) by mouth daily.        Allergies:  Allergies  Allergen Reactions   Contrast Media [Iodinated Contrast Media]     Throat swells from ct contra   Adhesive [Tape]     No paper tape, causes blisters   Erythromycin Rash   Penicillins Rash    Did it involve swelling of the face/tongue/throat, SOB, or low BP? No Did it involve sudden or severe rash/hives, skin peeling, or any reaction on the inside of your mouth or nose? No Did you need to seek medical attention at a hospital or doctor's office? No When did it last happen?    Many years If all above answers are "NO", may proceed with cephalosporin use.     Past Medical History:  Diagnosis Date   Anemia    Arthritis    Bronchitis    hx of   Diabetes mellitus  type 2   Family history of adverse reaction to anesthesia    per patient, "daughter threw up after a knee surgery"   GERD (gastroesophageal reflux disease)    Hypercholesteremia    Hypertension    Hypothyroidism    Pneumonia    hx of   PONV (postoperative nausea and vomiting)    Thyroid disease     Past Surgical History:  Procedure Laterality Date   ABDOMINAL HYSTERECTOMY     partial   BREAST SURGERY     lumpectomy left breast   carpel tunnel     COLONOSCOPY   07/2020   COLONOSCOPY W/ POLYPECTOMY     FOOT SURGERY     knee arhtroscopy     KNEE ARTHROPLASTY Left 05/2020   ORIF PATELLA Left 05/23/2020   Procedure: LEFT KNEE POLE PATELLA EXCISION;  Surgeon: [name], MD;  Location: [place];  Service: Orthopedics;  Laterality: Left;   SHOULDER ARTHROSCOPY WITH SUBACROMIAL DECOMPRESSION, ROTATOR CUFF REPAIR AND BICEP TENDON REPAIR Right 05/26/2012   Procedure: Right Shoulder Diagnostic Operative Arthroscopy, Subacromial Decompression, Biceps Tenodesis, Mini Open Rotator Cuff Repair;  Surgeon: [name], MD;  Location: [place];  Service: Orthopedics;  Laterality: Right;  Right Shoulder Diagnostic Operative Arthroscopy, Subacromial Decompression, Biceps Tenodesis, Mini Open Rotator Cuff Repair   TONSILLECTOMY     TOTAL KNEE ARTHROPLASTY Left 07/01/2019   Procedure: LEFT TOTAL KNEE ARTHROPLASTY;  Surgeon: [name], MD;  Location: [place];  Service: Orthopedics;  Laterality: Left;   UPPER GI ENDOSCOPY  07/2020    Family History  Problem Relation Age of Onset   Diabetes Mother    Heart disease Mother    Hyperlipidemia Mother    Diabetes Father    Diabetes Sister    Hypertension Sister    Heart disease Sister    Dementia Sister    Heart disease Brother    Healthy Daughter    Healthy Daughter    Healthy Daughter    Healthy Son     Social History:  reports that she quit smoking about 31 years ago. Her smoking use included cigarettes. She has never been exposed to tobacco smoke. She has never used smokeless tobacco. She reports that she does not currently use alcohol. She reports that she does not use drugs.    Review of Systems   HYPERTENSION: She is on amlodipine 5 mg once daily and Diovan to 20 mg  Was tried on diltiazem instead of amlodipine before but she felt that this was causing hair loss and constipation Also HCTZ had caused hyponatremia  She is checking blood pressure at home with normal readings  BP Readings  from Last 3 Encounters:  11/01/21 132/64  09/27/21 130/70  09/18/21 (!) 162/82    On Lasix 40-80 mg for edema, however she likely is not having as much edema as she thinks on today's observation  NEPHROPATHY: She has mild persistently high microalbumin  LIPIDS:  She is taking rosuvastatin 40 mg, previously LDL was high with simvastatin 20 mg LDL below 100 Crestor prescribed by PCP      Lab Results  Component Value Date   CHOL 133 08/24/2019   HDL 59.70 08/24/2019   LDLCALC 61 08/24/2019   LDLDIRECT 62.0 05/18/2021   TRIG 64.0 08/24/2019   CHOLHDL 2 08/24/2019                  Thyroid:      She has been hypothyroid for over 40 years  The dose has been adjusted periodically She takes levothyroxine before breakfast consistently She feels fairly good  TSH has been consistent within the normal range with taking 112 mcg  Last labs:   Lab Results  Component Value Date   TSH 1.96 10/29/2021   TSH 0.75 09/27/2021   TSH 3.85 05/18/2021   FREET4 1.12 10/29/2021   FREET4 1.01 01/18/2021   FREET4 1.58 08/24/2019    She is followed periodically by podiatrist  Physical Examination:  BP 132/64   Pulse 77   Ht 5' 4.75" (1.645 m)   Wt 207 lb 3.2 oz (94 kg)   SpO2 96%   BMI 34.75 kg/m   No edema  She has firm subcutaneous nodules in the right upper arm  ASSESSMENT/PLAN:  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:  See history of present illness for detailed discussion of current diabetes management, blood sugar patterns and problems identified  She is currently on basal insulin, Farxiga 5 mg and metformin  A1c is excellent at 6.1 now  Blood sugars on her sensor download recently are 97% within target range and averaging only 110 Only rarely will have high postprandial readings if she is drinking sweet tea or getting more carbs Weight has leveled off As above may have low normal readings overnight  Recommendations:   She will reduce Tresiba insulin by 2 units to  28 No change in [place] 5 mg daily Also she can minimize use of sweet tea but she can take NovoLog if she is planning to have some Follow-up in 4 months  HYPERTENSION with microalbuminuria: Blood pressure is controlled Last microalbumin normal  Hypothyroidism: Adequately replaced  Advised her to discuss with her PCP or rheumatologist about the nodules in her arms  There are no Patient Instructions on file for this visit.   Elayne Snare 11/01/2021, 10:24 AM   Note: This office note was prepared with Dragon voice recognition system technology. Any transcriptional errors that result from this process are unintentional.

## 2021-11-01 NOTE — Patient Instructions (Signed)
Tresiba 28 units daily, keep am sugar 80-120

## 2021-11-01 NOTE — Progress Notes (Signed)
Pls cosign for B12 inj../lmb  

## 2021-11-02 ENCOUNTER — Other Ambulatory Visit: Payer: Self-pay | Admitting: Internal Medicine

## 2021-11-05 ENCOUNTER — Other Ambulatory Visit: Payer: Self-pay | Admitting: Physician Assistant

## 2021-11-05 NOTE — Telephone Encounter (Signed)
Next Visit: Due September 2023. Message sent to the front to schedule.   Last Visit: 09/18/2021  Last Fill: 08/06/2021  DX: Rheumatoid arthritis involving multiple sites with positive rheumatoid factor   Current Dose per office note 09/18/2021: Arava 20 mg 1 tablet by mouth daily  Labs: 09/18/2021 CBC and CMP WNL  Okay to refill Arava?

## 2021-11-07 DIAGNOSIS — E1144 Type 2 diabetes mellitus with diabetic amyotrophy: Secondary | ICD-10-CM | POA: Diagnosis not present

## 2021-11-08 ENCOUNTER — Encounter: Payer: Self-pay | Admitting: Endocrinology

## 2021-11-09 ENCOUNTER — Telehealth: Payer: Self-pay

## 2021-11-09 NOTE — Telephone Encounter (Signed)
Patient Advocate Encounter   Received notification from CoverMyMeds that prior authorization is required for Dexcom G6 Sensor  Submitted: 11/09/2021 [place], CPhT Rx Patient Advocate Specialist Phone: [phone]

## 2021-11-12 NOTE — Telephone Encounter (Signed)
Patient Advocate Encounter  Prior Authorization for Dexcom G6 has been approved.   Effective: 11/09/2021 to  11/10/2022 Determination letter added to patient chart  Clista Bernhardt, CPhT Rx Patient Advocate Specialist Phone: [phone]

## 2021-11-13 ENCOUNTER — Ambulatory Visit: Payer: Self-pay | Admitting: Licensed Clinical Social Worker

## 2021-11-13 NOTE — Patient Instructions (Addendum)
Visit Information  Thank you for taking time to visit with me today. Please don't hesitate to contact me if I can be of assistance to you before our next scheduled telephone appointment.  Following are the goals we discussed today:   No further intervention  is needed at this time  Please call the care guide team at [phone] if you need to cancel or reschedule your appointment.   If you are experiencing a Mental Health or [place] or need someone to talk to, please go to [facility] [place] [phone])   Following is a copy of your full plan of care:   Care Coordination Interventions:  Active listening / Reflection utilized  Informed client of Care Coordination program support Client said she was doing well with her PCP support and with her family support . She declined Care Coordination support at this time  Ms. Veno was given information about Care Management services by the embedded care coordination team including:  Care Management services include personalized support from designated clinical staff supervised by her physician, including individualized plan of care and coordination with other care providers 24/7 contact phone numbers for assistance for urgent and routine care needs. The patient may stop CCM services at any time (effective at the end of the month) by phone call to the office staff.  Patient agreed to services and verbal consent obtained.   Norva Riffle.Meshawn Oconnor MSW, [place] representative [facility] Care Management [phone]

## 2021-11-13 NOTE — Patient Outreach (Signed)
  Care Coordination   Initial Visit Note   11/13/2021 Name: Marissa Roberts MRN: [id number] DOB: 10-Feb-1946  Marissa Roberts is a 76 y.o. year old female who sees [name], MD for primary care. I spoke with  [place] by phone today  What matters to the patients health and wellness today?  Patient said she was stable and had good support at present. She declined Care Coordination support at this time    Goals Addressed             This Visit's Progress    Patient said she was stable and had good support. she declined Care Coordination support at this time       Care Coordination Interventions:  Active listening / Reflection utilized  Informed client of Care Coordination program support Client said she was doing well with her PCP support and with her family support . She declined Care Coordination support at this time       SDOH assessments and interventions completed:  No     Care Coordination Interventions Activated:  No  Care Coordination Interventions:  No, not indicated   Follow up plan: No further intervention required.   Encounter Outcome:  Pt. Visit Completed

## 2021-11-15 ENCOUNTER — Ambulatory Visit (INDEPENDENT_AMBULATORY_CARE_PROVIDER_SITE_OTHER): Payer: Medicare Other | Admitting: *Deleted

## 2021-11-15 DIAGNOSIS — E538 Deficiency of other specified B group vitamins: Secondary | ICD-10-CM | POA: Diagnosis not present

## 2021-11-15 MED ORDER — CYANOCOBALAMIN 1000 MCG/ML IJ SOLN
1000.0000 ug | Freq: Once | INTRAMUSCULAR | Status: AC
  Administered 2021-11-15: 1000 ug via INTRAMUSCULAR

## 2021-11-15 NOTE — Progress Notes (Signed)
Pls cosign for B12 inj../lmb  

## 2021-11-21 ENCOUNTER — Encounter: Payer: Self-pay | Admitting: Internal Medicine

## 2021-11-21 ENCOUNTER — Ambulatory Visit (INDEPENDENT_AMBULATORY_CARE_PROVIDER_SITE_OTHER): Payer: Medicare Other | Admitting: Internal Medicine

## 2021-11-21 VITALS — BP 120/78 | HR 74 | Temp 98.8°F | Ht 64.0 in | Wt 210.1 lb

## 2021-11-21 DIAGNOSIS — R229 Localized swelling, mass and lump, unspecified: Secondary | ICD-10-CM

## 2021-11-21 DIAGNOSIS — E538 Deficiency of other specified B group vitamins: Secondary | ICD-10-CM

## 2021-11-21 MED ORDER — CYANOCOBALAMIN 1000 MCG/ML IJ SOLN
1000.0000 ug | Freq: Once | INTRAMUSCULAR | Status: AC
  Administered 2021-11-21: 1000 ug via INTRAMUSCULAR

## 2021-11-21 NOTE — Patient Instructions (Signed)
We do not need to check the arms.

## 2021-11-21 NOTE — Progress Notes (Signed)
   Subjective:   Patient ID: Marissa Roberts, female    DOB: June 03, 1945, 76 y.o.   MRN: [id number]  HPI The patient is a 76 YO female coming in for knots on arms. Not painful. Noticing more with losing weight.  Review of Systems  Constitutional: Negative.   HENT: Negative.    Eyes: Negative.   Respiratory:  Negative for cough, chest tightness and shortness of breath.   Cardiovascular:  Negative for chest pain, palpitations and leg swelling.  Gastrointestinal:  Negative for abdominal distention, abdominal pain, constipation, diarrhea, nausea and vomiting.  Musculoskeletal: Negative.   Skin: Negative.   Neurological: Negative.   Psychiatric/Behavioral: Negative.      Objective:  Physical Exam Constitutional:      Appearance: She is well-developed. She is obese.  HENT:     Head: Normocephalic and atraumatic.  Cardiovascular:     Rate and Rhythm: Normal rate and regular rhythm.  Pulmonary:     Effort: Pulmonary effort is normal. No respiratory distress.     Breath sounds: Normal breath sounds. No wheezing or rales.  Abdominal:     General: Bowel sounds are normal. There is no distension.     Palpations: Abdomen is soft.     Tenderness: There is no abdominal tenderness. There is no rebound.  Musculoskeletal:     Cervical back: Normal range of motion.  Skin:    General: Skin is warm and dry.     Comments: Some subcutaneous nodules on the arms bilaterally which are mobile  Neurological:     Mental Status: She is alert and oriented to person, place, and time.     Coordination: Coordination normal.     Vitals:   11/21/21 0911  BP: 120/78  Pulse: 74  Temp: 98.8 F (37.1 C)  SpO2: 94%  Weight: 210 lb 2 oz (95.3 kg)  Height: '5\' 4"'$  (1.626 m)    Assessment & Plan:  B12 shot given at visit

## 2021-11-22 DIAGNOSIS — R229 Localized swelling, mass and lump, unspecified: Secondary | ICD-10-CM | POA: Insufficient documentation

## 2021-11-22 DIAGNOSIS — E119 Type 2 diabetes mellitus without complications: Secondary | ICD-10-CM | POA: Diagnosis not present

## 2021-11-22 NOTE — Assessment & Plan Note (Signed)
Given 4th B12 shot today and will transition to oral B12 1000 mcg daily otc. Given instructions about that.

## 2021-11-22 NOTE — Assessment & Plan Note (Signed)
Likely related to rheumatoid arthritis or weight loss. Could be associated with unknown sarcoidosis however she does not have symptoms of that. Last CXR 2019 was normal.

## 2021-11-28 DIAGNOSIS — E1144 Type 2 diabetes mellitus with diabetic amyotrophy: Secondary | ICD-10-CM | POA: Diagnosis not present

## 2021-11-29 ENCOUNTER — Ambulatory Visit: Payer: Medicare Other

## 2021-11-29 ENCOUNTER — Telehealth: Payer: Self-pay

## 2021-11-29 NOTE — Telephone Encounter (Signed)
Patint called in states that she is on prednisone shots twice a week for 4 weeks. Started yesterday and was informed to call you to see if changes needed to be made to insulin.

## 2021-11-30 DIAGNOSIS — E1144 Type 2 diabetes mellitus with diabetic amyotrophy: Secondary | ICD-10-CM | POA: Diagnosis not present

## 2021-11-30 NOTE — Telephone Encounter (Signed)
Called gave adjustments to patient. She understands.

## 2021-12-01 ENCOUNTER — Other Ambulatory Visit: Payer: Self-pay | Admitting: Internal Medicine

## 2021-12-05 DIAGNOSIS — E1144 Type 2 diabetes mellitus with diabetic amyotrophy: Secondary | ICD-10-CM | POA: Diagnosis not present

## 2021-12-07 DIAGNOSIS — E1144 Type 2 diabetes mellitus with diabetic amyotrophy: Secondary | ICD-10-CM | POA: Diagnosis not present

## 2021-12-12 DIAGNOSIS — E1144 Type 2 diabetes mellitus with diabetic amyotrophy: Secondary | ICD-10-CM | POA: Diagnosis not present

## 2021-12-14 DIAGNOSIS — E1144 Type 2 diabetes mellitus with diabetic amyotrophy: Secondary | ICD-10-CM | POA: Diagnosis not present

## 2021-12-19 DIAGNOSIS — E1144 Type 2 diabetes mellitus with diabetic amyotrophy: Secondary | ICD-10-CM | POA: Diagnosis not present

## 2021-12-22 ENCOUNTER — Other Ambulatory Visit: Payer: Self-pay | Admitting: Endocrinology

## 2021-12-26 ENCOUNTER — Other Ambulatory Visit: Payer: Self-pay | Admitting: Endocrinology

## 2021-12-26 ENCOUNTER — Telehealth: Payer: Self-pay | Admitting: Rheumatology

## 2021-12-26 DIAGNOSIS — E063 Autoimmune thyroiditis: Secondary | ICD-10-CM

## 2021-12-26 NOTE — Telephone Encounter (Signed)
Reached out to patient to schedule follow up appointment. Patient states she will have to call back to schedule.

## 2021-12-26 NOTE — Telephone Encounter (Signed)
-----   Message from Carole Binning, LPN sent at 12/22/3007  9:35 AM EDT ----- Regarding: Schedule follow up visit. Please contact patient and schedule a follow up visit. Patient due September 2023. Thanks!

## 2021-12-27 ENCOUNTER — Telehealth: Payer: Self-pay | Admitting: Pharmacist

## 2021-12-27 NOTE — Progress Notes (Unsigned)
Office Visit Note  Patient: Marissa Roberts             Date of Birth: 1945/04/23           MRN: [id number]             PCP: [name], MD Referring: [name], * Visit Date: 01/02/2022 Occupation: '@GUAROCC'$ @  Subjective:  Left leg pain   History of Present Illness: Marissa Roberts is a 76 y.o. female with history of seropositive rheumatoid arthritis and osteoarthritis.  Patient has been off of leflunomide and Humira since her last office visit on 09/18/2021.  She discontinued leflunomide due to ongoing hair loss.  She has been off of Humira since being diagnosed with diabetic amyotrophy.  Continues to experience weakness and pain in the left lower extremity.  She has been under the care of Dr. Ermalene Postin at [facility] neurology.  She has been taking Lyrica as well as tramadol as needed for pain relief.  She has also had several Solu-Medrol infusions which has started to help improve her mobility and range of motion in the left lower extremity.  She has an upcoming appointment with her neurologist on 01/07/2022 for further evaluation and to discuss the neck steps. She denies any signs or symptoms of a rheumatoid arthritis flare.  She is not currently experiencing any joint swelling.  She is apprehensive to add any other new medications at this time since her rheumatoid arthritis has been well controlled off of therapy.  Activities of Daily Living:  Patient reports morning stiffness for 0 minutes.   Patient Denies nocturnal pain.  Difficulty dressing/grooming: Denies Difficulty climbing stairs: Reports Difficulty getting out of chair: Reports Difficulty using hands for taps, buttons, cutlery, and/or writing: Denies  Review of Systems  Constitutional:  Positive for fatigue.  HENT:  Negative for mouth sores and mouth dryness.   Eyes:  Positive for visual disturbance. Negative for dryness.  Respiratory:  Negative for shortness of breath.   Cardiovascular:  Negative for  chest pain and palpitations.  Gastrointestinal:  Positive for constipation. Negative for blood in stool and diarrhea.  Endocrine: Negative for increased urination.  Genitourinary:  Negative for involuntary urination.  Musculoskeletal:  Positive for gait problem, myalgias, muscle weakness, muscle tenderness and myalgias. Negative for joint pain, joint pain, joint swelling and morning stiffness.  Skin:  Positive for rash and hair loss. Negative for color change and sensitivity to sunlight.  Allergic/Immunologic: Negative for susceptible to infections.  Neurological:  Negative for dizziness and headaches.  Hematological:  Negative for swollen glands.  Psychiatric/Behavioral:  Negative for depressed mood and sleep disturbance. The patient is not nervous/anxious.     PMFS History:  Patient Active Problem List   Diagnosis Date Noted   Generalized subcutaneous nodules 11/22/2021   Left leg pain 04/26/2021   History of laparoscopic-assisted vaginal hysterectomy 04/23/2021   Vaginal yeast infection 03/30/2021   Displaced transverse fracture of left patella, subsequent encounter for closed fracture with delayed healing    Arthritis of knee 05/23/2020   Pain in knee region after total knee replacement ([place]) 03/22/2020   Arthritis of left knee 07/01/2019   Hypercholesterolemia 04/20/2019   Arthritis 04/20/2019   B12 deficiency 03/23/2019   Anemia 02/05/2019   Rheumatoid arthritis with rheumatoid factor of multiple sites without organ or systems involvement ([place]) 01/25/2019   High risk medication use 01/25/2019   Fatigue 09/18/2018   Encounter for general adult medical examination with abnormal findings 02/07/2015  Morbid obesity ([place]) 04/19/2013   Hyperlipidemia associated with type 2 diabetes mellitus ([place]) 07/28/2009   Osteopenia 03/14/2009   Diabetes mellitus type 2 with complications ([place]) 16/09/3708   Hypothyroidism 09/24/2006   Essential hypertension 09/24/2006    Past Medical  History:  Diagnosis Date   Anemia    Arthritis    Bronchitis    hx of   Diabetes mellitus    type 2   Family history of adverse reaction to anesthesia    per patient, "daughter threw up after a knee surgery"   GERD (gastroesophageal reflux disease)    Hypercholesteremia    Hypertension    Hypothyroidism    Pneumonia    hx of   PONV (postoperative nausea and vomiting)    Thyroid disease     Family History  Problem Relation Age of Onset   Diabetes Mother    Heart disease Mother    Hyperlipidemia Mother    Diabetes Father    Diabetes Sister    Hypertension Sister    Heart disease Sister    Dementia Sister    Heart disease Brother    Healthy Daughter    Healthy Daughter    Healthy Daughter    Healthy Son    Past Surgical History:  Procedure Laterality Date   ABDOMINAL HYSTERECTOMY     partial   BREAST SURGERY     lumpectomy left breast   carpel tunnel     COLONOSCOPY  07/2020   COLONOSCOPY W/ POLYPECTOMY     FOOT SURGERY     knee arhtroscopy     KNEE ARTHROPLASTY Left 05/2020   ORIF PATELLA Left 05/23/2020   Procedure: LEFT KNEE POLE PATELLA EXCISION;  Surgeon: [name], MD;  Location: [place];  Service: Orthopedics;  Laterality: Left;   SHOULDER ARTHROSCOPY WITH SUBACROMIAL DECOMPRESSION, ROTATOR CUFF REPAIR AND BICEP TENDON REPAIR Right 05/26/2012   Procedure: Right Shoulder Diagnostic Operative Arthroscopy, Subacromial Decompression, Biceps Tenodesis, Mini Open Rotator Cuff Repair;  Surgeon: [name], MD;  Location: [place];  Service: Orthopedics;  Laterality: Right;  Right Shoulder Diagnostic Operative Arthroscopy, Subacromial Decompression, Biceps Tenodesis, Mini Open Rotator Cuff Repair   TONSILLECTOMY     TOTAL KNEE ARTHROPLASTY Left 07/01/2019   Procedure: LEFT TOTAL KNEE ARTHROPLASTY;  Surgeon: [name], MD;  Location: [place];  Service: Orthopedics;  Laterality: Left;   UPPER GI ENDOSCOPY  07/2020   Social History   Social  History Narrative   Not on file   Immunization History  Administered Date(s) Administered   Fluad Quad(high Dose 65+) 01/02/2019, 12/31/2019   Influenza Split 01/03/2011   Influenza Whole 04/01/2005, 12/18/2009   Influenza, High Dose Seasonal PF 01/07/2014, 01/27/2015, 01/02/2016, 01/13/2017, 01/16/2018   Influenza-Unspecified 11/30/2012, 01/03/2021   PFIZER(Purple Top)SARS-COV-2 Vaccination 05/28/2019, 06/23/2019, 01/15/2020, 07/03/2020, 01/03/2021   Pneumococcal Conjugate-13 09/12/2016, 01/02/2019   Pneumococcal Polysaccharide-23 07/15/2014, 12/27/2019   Td 04/01/2001, 10/07/2013   Zoster Recombinat (Shingrix) 01/09/2021, 03/12/2021     Objective: Vital Signs: BP (!) 145/76 (BP Location: Left Arm, Patient Position: Sitting, Cuff Size: Large)   Pulse 79   Resp 18   Ht '5\' 5"'$  (1.651 m)   Wt 215 lb 9.6 oz (97.8 kg)   BMI 35.88 kg/m    Physical Exam Vitals and nursing note reviewed.  Constitutional:      Appearance: She is well-developed.  HENT:     Head: Normocephalic and atraumatic.  Eyes:     Conjunctiva/sclera: Conjunctivae normal.  Cardiovascular:  Rate and Rhythm: Normal rate and regular rhythm.     Heart sounds: Normal heart sounds.  Pulmonary:     Effort: Pulmonary effort is normal.     Breath sounds: Normal breath sounds.  Abdominal:     General: Bowel sounds are normal.     Palpations: Abdomen is soft.  Musculoskeletal:     Cervical back: Normal range of motion.  Lymphadenopathy:     Cervical: No cervical adenopathy.  Skin:    General: Skin is warm and dry.     Capillary Refill: Capillary refill takes less than 2 seconds.  Neurological:     Mental Status: She is alert and oriented to person, place, and time.  Psychiatric:        Behavior: Behavior normal.      Musculoskeletal Exam: C-spine, thoracic spine, and lumbar spine good ROM.  No midline spinal tenderness or SI joint tenderness.  Shoulder joints, elbow joints, wrist joints, MCPs, PIPs, DIPs  have good range of motion with no synovitis.  Complete fist formation bilaterally.  Hip joints have good range of motion with no groin pain.  Left lower extremity muscle weakness.  No warmth or effusion in knee joints noted.  Ankle joints have good range of motion with no tenderness or joint swelling.  CDAI Exam: CDAI Score: -- Patient Global: 1 mm; Provider Global: 1 mm Swollen: --; Tender: -- Joint Exam 01/02/2022   No joint exam has been documented for this visit   There is currently no information documented on the homunculus. Go to the Rheumatology activity and complete the homunculus joint exam.  Investigation: No additional findings.  Imaging: No results found.  Recent Labs: Lab Results  Component Value Date   WBC 7.8 09/18/2021   HGB 11.7 09/18/2021   PLT 215 09/18/2021   NA 142 10/29/2021   K 4.2 10/29/2021   CL 105 10/29/2021   CO2 27 10/29/2021   GLUCOSE 74 10/29/2021   BUN 12 10/29/2021   CREATININE 0.64 10/29/2021   BILITOT 0.3 09/18/2021   ALKPHOS 45 07/30/2021   AST 16 09/18/2021   ALT 12 09/18/2021   PROT 6.4 09/18/2021   ALBUMIN 4.0 07/30/2021   CALCIUM 9.8 10/29/2021   GFRAA 101 09/18/2020   QFTBGOLDPLUS NEGATIVE 01/09/2021    Speciality Comments: PLQ Eye Exam: 12/22/2019 WNL @ [facility] follow up in 6 months  Methotrexate-hair loss  Procedures:  No procedures performed Allergies: Contrast media [iodinated contrast media], Adhesive [tape], Erythromycin, and Penicillins    Assessment / Plan:     Visit Diagnoses: Rheumatoid arthritis involving multiple sites with positive rheumatoid factor ([place]) - She has no joint tenderness or synovitis on examination today.  She has not had any signs or symptoms of a rheumatoid arthritis flare recently.  She is not currently taking any immunosuppressive agents.  She discontinued Humira and leflunomide after her last office visit on 09/18/2021.  Leflunomide was discontinued due to ongoing hair loss.  Humira was  discontinued due to the concern for demyelinating neuropathy in the left lower extremity noted on EMG.  She has been diagnosed with diabetic amyotrophy affecting the left LE.  She has undergone 8 Solu-Medrol IV infusions and has an upcoming appointment with her neurologist Dr. Ermalene Postin on 01/07/2022 to discuss the next steps in treatment.  She is currently taking Lyrica as prescribed and tramadol as needed for pain relief.  She has been using a cane to help assist with ambulation.  She continues to have persistent pain  and muscle weakness in the left thigh.  She is not experiencing other joint pain or inflammation at this time.  She is apprehensive to add any other medications at this time.  Discussed that the Solu-Medrol infusions is likely controlling her rheumatoid arthritis.  She does not want to add on any immunosuppressive agents at this time. She was advised to notify us if she develops signs or symptoms of a flare.  Patient requested to have repeat lab work today.  Discussed that her sed rate and CRP may not be accurate due to recent Solu-Medrol infusions.  She will follow-up in the office in 3 months or sooner if needed. Plan: CBC with Differential/Platelet, COMPLETE METABOLIC PANEL WITH GFR, Sedimentation rate, Rheumatoid factor, Cyclic citrul peptide antibody, IgG, C-reactive protein  High risk medication use -Patient is not currently taking any immunosuppressive agents.  She has had a recent Solu-Medrol infusions for treatment of diabetic amyotrophy-remains under care of Dr. Ermalene Postin.  Discontinued Humira-June 2023.   Previous therapy: Arava-hair loss-d/c June 2023, MTX-hair loss, plaquenil.  CBC and CMP drawn on 09/18/21.  Orders for CBC and CMP released today.    - Plan: CBC with Differential/Platelet, COMPLETE METABOLIC PANEL WITH GFR  Trigger finger, left middle finger - Resolved with no recurrence.   Primary osteoarthritis of both hands: She has PIP and DIP thickening consistent with  osteoarthritis of both hands.  No inflammation noted on examination today.  Complete fist formation bilaterally.  Discussed the importance of joint protection and muscle strengthening.  She was advised to notify us if she develops increased joint pain or joint swelling.  Trochanteric bursitis, left hip: She has some tenderness over the left trochanteric bursa on examination today.  S/P total knee replacement, left - Performed by Dr. Marlou Sa on 07/01/19.  Bone scan of both lower extremity performed on 11/30/2020.  She has chronic pain in the left thigh as well as progressive muscle weakness.  She has been using a cane to assist with ambulation.  She has been diagnosed with diabetic amyotrophy affecting the left thigh.  Primary osteoarthritis of both feet: She is not experiencing any increased discomfort in her feet at this time.  No synovitis of ankle joints noted.  She is wearing proper fitting shoes.  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    CT pelvis 10/11/2021 asymmetric atrophy in left thigh musculature. Patient was diagnosed with diabetic amyotrophy affecting left thigh and has been under the care of Dr. Ermalene Postin [facility] neurology.  Patient underwent 8 Solu-Medrol IV infusions starting on 11/28/2021.  It is unclear if the patient plans to proceed with IVIG. Patient has an upcoming appointment with Dr. Ermalene Postin on 01/07/2022 to discuss the next steps in treatment.  She remains on Lyrica as prescribed and has been taking tramadol for pain relief.  She continues to use a cane to  assist with ambulation.  History of osteopenia - No DEXA in epic to review.   Other medical conditions are listed as follows:  Essential hypertension  Diabetes mellitus type 2 with complications ([place])  Hyperlipidemia associated with type 2 diabetes mellitus ([place])  History of hypothyroidism  Orders: Orders Placed  This Encounter  Procedures   CBC with Differential/Platelet   COMPLETE METABOLIC PANEL WITH GFR   Sedimentation rate   Rheumatoid factor   Cyclic citrul peptide antibody, IgG   C-reactive protein   No orders of the defined types were placed in this encounter.    Follow-Up Instructions: Return in about 3 months (around 04/04/2022) for Rheumatoid arthritis, Osteoarthritis.   Ofilia Neas, PA-C  Note - This record has been created using Dragon software.  Chart creation errors have been sought, but may not always  have been located. Such creation errors do not reflect on  the standard of medical care.

## 2021-12-27 NOTE — Telephone Encounter (Signed)
Submitted a Prior Authorization RENEWAL request to [facility] for HUMIRA via CoverMyMeds. Will update once we receive a response.  Key: [id number]  Per dispense history, last filled 07/05/21  Knox Saliva, PharmD, MPH, BCPS, CPP Clinical Pharmacist (Rheumatology and Pulmonology)

## 2021-12-28 NOTE — Telephone Encounter (Signed)
Received notification from [facility] regarding a prior authorization for [place]. Authorization has been APPROVED from 12/27/21 to 12/28/22.   Patient must continue to fill through [facility]: [phone]  Authorization #  [id number]  Knox Saliva, PharmD, MPH, BCPS, CPP Clinical Pharmacist (Rheumatology and Pulmonology)

## 2021-12-31 DIAGNOSIS — E119 Type 2 diabetes mellitus without complications: Secondary | ICD-10-CM | POA: Diagnosis not present

## 2022-01-02 ENCOUNTER — Other Ambulatory Visit: Payer: Self-pay | Admitting: Endocrinology

## 2022-01-02 ENCOUNTER — Ambulatory Visit: Payer: Medicare Other | Attending: Physician Assistant | Admitting: Physician Assistant

## 2022-01-02 ENCOUNTER — Encounter: Payer: Self-pay | Admitting: Physician Assistant

## 2022-01-02 VITALS — BP 145/76 | HR 79 | Resp 18 | Ht 65.0 in | Wt 215.6 lb

## 2022-01-02 DIAGNOSIS — M0579 Rheumatoid arthritis with rheumatoid factor of multiple sites without organ or systems involvement: Secondary | ICD-10-CM | POA: Diagnosis not present

## 2022-01-02 DIAGNOSIS — E1169 Type 2 diabetes mellitus with other specified complication: Secondary | ICD-10-CM

## 2022-01-02 DIAGNOSIS — M19071 Primary osteoarthritis, right ankle and foot: Secondary | ICD-10-CM

## 2022-01-02 DIAGNOSIS — I10 Essential (primary) hypertension: Secondary | ICD-10-CM

## 2022-01-02 DIAGNOSIS — Z8739 Personal history of other diseases of the musculoskeletal system and connective tissue: Secondary | ICD-10-CM

## 2022-01-02 DIAGNOSIS — G629 Polyneuropathy, unspecified: Secondary | ICD-10-CM

## 2022-01-02 DIAGNOSIS — M19042 Primary osteoarthritis, left hand: Secondary | ICD-10-CM

## 2022-01-02 DIAGNOSIS — M19041 Primary osteoarthritis, right hand: Secondary | ICD-10-CM

## 2022-01-02 DIAGNOSIS — Z96652 Presence of left artificial knee joint: Secondary | ICD-10-CM

## 2022-01-02 DIAGNOSIS — Z79899 Other long term (current) drug therapy: Secondary | ICD-10-CM

## 2022-01-02 DIAGNOSIS — E785 Hyperlipidemia, unspecified: Secondary | ICD-10-CM

## 2022-01-02 DIAGNOSIS — M7062 Trochanteric bursitis, left hip: Secondary | ICD-10-CM

## 2022-01-02 DIAGNOSIS — E1144 Type 2 diabetes mellitus with diabetic amyotrophy: Secondary | ICD-10-CM

## 2022-01-02 DIAGNOSIS — M65332 Trigger finger, left middle finger: Secondary | ICD-10-CM | POA: Diagnosis not present

## 2022-01-02 DIAGNOSIS — Z8639 Personal history of other endocrine, nutritional and metabolic disease: Secondary | ICD-10-CM

## 2022-01-02 DIAGNOSIS — E118 Type 2 diabetes mellitus with unspecified complications: Secondary | ICD-10-CM

## 2022-01-02 DIAGNOSIS — M19072 Primary osteoarthritis, left ankle and foot: Secondary | ICD-10-CM

## 2022-01-03 NOTE — Progress Notes (Signed)
CMP WNL. Hgb and hct are low.   Patient is not currently on humira or arava.    ESR and CRP WNL.  RF negative.   Please forward results to PCP as requested.

## 2022-01-04 LAB — COMPLETE METABOLIC PANEL WITH GFR
AG Ratio: 1.7 (calc) (ref 1.0–2.5)
ALT: 11 U/L (ref 6–29)
AST: 11 U/L (ref 10–35)
Albumin: 4 g/dL (ref 3.6–5.1)
Alkaline phosphatase (APISO): 55 U/L (ref 37–153)
BUN: 14 mg/dL (ref 7–25)
CO2: 28 mmol/L (ref 20–32)
Calcium: 9.7 mg/dL (ref 8.6–10.4)
Chloride: 107 mmol/L (ref 98–110)
Creat: 0.69 mg/dL (ref 0.60–1.00)
Globulin: 2.3 g/dL (calc) (ref 1.9–3.7)
Glucose, Bld: 72 mg/dL (ref 65–99)
Potassium: 4 mmol/L (ref 3.5–5.3)
Sodium: 144 mmol/L (ref 135–146)
Total Bilirubin: 0.3 mg/dL (ref 0.2–1.2)
Total Protein: 6.3 g/dL (ref 6.1–8.1)
eGFR: 91 mL/min/{1.73_m2} (ref >=60)

## 2022-01-04 LAB — CBC WITH DIFFERENTIAL/PLATELET
Absolute Monocytes: 978 cells/uL — ABNORMAL HIGH (ref 200–950)
Basophils Absolute: 42 cells/uL (ref 0–200)
Basophils Relative: 0.4 %
Eosinophils Absolute: 198 cells/uL (ref 15–500)
Eosinophils Relative: 1.9 %
HCT: 34.7 % — ABNORMAL LOW (ref 35.0–45.0)
Hemoglobin: 11.4 g/dL — ABNORMAL LOW (ref 11.7–15.5)
Lymphs Abs: 3390 cells/uL (ref 850–3900)
MCH: 28.9 pg (ref 27.0–33.0)
MCHC: 32.9 g/dL (ref 32.0–36.0)
MCV: 87.8 fL (ref 80.0–100.0)
MPV: 11 fL (ref 7.5–12.5)
Monocytes Relative: 9.4 %
Neutro Abs: 5793 cells/uL (ref 1500–7800)
Neutrophils Relative %: 55.7 %
Platelets: 262 10*3/uL (ref 140–400)
RBC: 3.95 10*6/uL (ref 3.80–5.10)
RDW: 14.2 % (ref 11.0–15.0)
Total Lymphocyte: 32.6 %
WBC: 10.4 10*3/uL (ref 3.8–10.8)

## 2022-01-04 LAB — RHEUMATOID FACTOR: Rheumatoid fact SerPl-aCnc: <14 IU/mL (ref <14)

## 2022-01-04 LAB — CYCLIC CITRUL PEPTIDE ANTIBODY, IGG: Cyclic Citrullin Peptide Ab: <16 UNITS

## 2022-01-04 LAB — C-REACTIVE PROTEIN: CRP: 1.3 mg/L (ref <8.0)

## 2022-01-04 LAB — SEDIMENTATION RATE: Sed Rate: 28 mm/h (ref 0–30)

## 2022-01-06 NOTE — Progress Notes (Signed)
Anti-CCP negative.

## 2022-01-07 DIAGNOSIS — E1144 Type 2 diabetes mellitus with diabetic amyotrophy: Secondary | ICD-10-CM | POA: Diagnosis not present

## 2022-01-15 ENCOUNTER — Encounter: Payer: Self-pay | Admitting: Podiatry

## 2022-01-15 ENCOUNTER — Ambulatory Visit (INDEPENDENT_AMBULATORY_CARE_PROVIDER_SITE_OTHER): Payer: Medicare Other | Admitting: Podiatry

## 2022-01-15 DIAGNOSIS — B351 Tinea unguium: Secondary | ICD-10-CM | POA: Diagnosis not present

## 2022-01-15 DIAGNOSIS — E119 Type 2 diabetes mellitus without complications: Secondary | ICD-10-CM | POA: Diagnosis not present

## 2022-01-15 DIAGNOSIS — M79675 Pain in left toe(s): Secondary | ICD-10-CM

## 2022-01-15 DIAGNOSIS — Z794 Long term (current) use of insulin: Secondary | ICD-10-CM

## 2022-01-15 DIAGNOSIS — Q828 Other specified congenital malformations of skin: Secondary | ICD-10-CM

## 2022-01-15 DIAGNOSIS — H401131 Primary open-angle glaucoma, bilateral, mild stage: Secondary | ICD-10-CM | POA: Diagnosis not present

## 2022-01-15 DIAGNOSIS — M79674 Pain in right toe(s): Secondary | ICD-10-CM | POA: Diagnosis not present

## 2022-01-15 NOTE — Progress Notes (Signed)
  Subjective:  Patient ID: Marissa Roberts, female    DOB: 03-21-1946,  MRN: [id number]  Marissa Roberts presents to clinic today for:  Chief Complaint  Patient presents with   Nail Problem    Diabetic foot care [id number] A1C-do not know PCP-Elizabeth Crawford PCP VST-11/2021   New problem(s): None.   PCP is [name], MD.  Patient states her husband is in the hospital.  Allergies  Allergen Reactions   Contrast Media [Iodinated Contrast Media]     Throat swells from ct contra   Adhesive [Tape]     No paper tape, causes blisters   Erythromycin Rash   Penicillins Rash    Did it involve swelling of the face/tongue/throat, SOB, or low BP? No Did it involve sudden or severe rash/hives, skin peeling, or any reaction on the inside of your mouth or nose? No Did you need to seek medical attention at a hospital or doctor's office? No When did it last happen?    Many years If all above answers are "NO", may proceed with cephalosporin use.     Review of Systems: Negative except as noted in the HPI.  Objective: No changes noted in today's physical examination.  [place] is a pleasant 76 y.o. female in NAD. AAO x 3.  Vascular Examination: Capillary fill time to digits <3 seconds b/l lower extremities. Intact b/l with palpable pedal pulses. No edema. No pain with calf compression b/l. Skin temperature gradient WNL b/l.   Neurological Examination: Sensation grossly intact b/l with 10 gram monofilament. Vibratory sensation intact b/l. Pt has subjective symptoms of neuropathy.  Dermatological Examination: Skin warm and supple b/l lower extremities.   Porokeratotic lesion(s) R 4th toe. No erythema, no edema, no drainage, no fluctuance.   Toenails 1-5 b/l thick, discolored, elongated with subungual debris and pain on dorsal palpation.   Musculoskeletal Examination: Normal muscle strength 5/5 to all lower extremity muscle groups bilaterally. Hammertoe(s) noted to  the L 5th toe and R 5th toe.  Assessment/Plan: 1. Pain due to onychomycosis of toenails of both feet   2. Porokeratosis   3. Type 2 diabetes mellitus without complication, with long-term current use of insulin (HCC)     No orders of the defined types were placed in this encounter.   -Consent given for treatment as described below: -Examined patient. -Toenails 1-5 b/l were debrided in length and girth with sterile nail nippers and dremel without iatrogenic bleeding.  -Porokeratotic lesion(s) right fourth digit pared and enucleated with sterile currette without incident. Total number of lesions debrided=1. -Patient/POA to call should there be question/concern in the interim.   Return in about 3 months (around 04/17/2022).  Marzetta Board, DPM

## 2022-01-16 ENCOUNTER — Ambulatory Visit (INDEPENDENT_AMBULATORY_CARE_PROVIDER_SITE_OTHER): Payer: Medicare Other

## 2022-01-16 DIAGNOSIS — Z0000 Encounter for general adult medical examination without abnormal findings: Secondary | ICD-10-CM

## 2022-01-16 NOTE — Progress Notes (Signed)
Virtual Visit via Telephone Note  I connected with  Marissa Roberts on 01/16/22 at  9:30 AM EDT by telephone and verified that I am speaking with the correct person using two identifiers.  Location: Patient: Home Provider: Kerrick Persons participating in the virtual visit: [place]   I discussed the limitations, risks, security and privacy concerns of performing an evaluation and management service by telephone and the availability of in person appointments. The patient expressed understanding and agreed to proceed.  Interactive audio and video telecommunications were attempted between this nurse and patient, however failed, due to patient having technical difficulties OR patient did not have access to video capability.  We continued and completed visit with audio only.  Some vital signs may be absent or patient reported.   Sheral Flow, LPN  Subjective:   Marissa Roberts is a 76 y.o. female who presents for Medicare Annual (Subsequent) preventive examination.  Review of Systems     Cardiac Risk Factors include: advanced age (>54mn, >>47women);diabetes mellitus;dyslipidemia;family history of premature cardiovascular disease;hypertension;obesity (BMI >30kg/m2);sedentary lifestyle     Objective:    There were no vitals filed for this visit. There is no height or weight on file to calculate BMI.     01/16/2022    9:34 AM 01/09/2021    9:43 AM 05/23/2020    1:17 PM 05/16/2020    2:53 PM 07/16/2019   11:39 AM 07/04/2019   12:00 PM 06/22/2019    1:31 PM  Advanced Directives  Does Patient Have a Medical Advance Directive? Yes Yes Yes No Yes Yes Yes  Type of AParamedicof AMc[place]Living will  Living will  Healthcare Power of AJoffreLiving will  Does patient want to make changes to medical advance directive?  No - Patient declined No - Patient declined   No -  Patient declined   [place] of [place]? No - copy requested      No - copy requested  Would patient like information on creating a medical advance directive?  No - Patient declined No - Patient declined No - Patient declined       Current Medications (verified) Outpatient Encounter Medications as of 01/16/2022  Medication Sig   amLODipine (NORVASC) 10 MG tablet Take 1 tablet by mouth once daily   BD PEN NEEDLE NANO U/F 32G X 4 MM MISC USE 1  4 TIMES DAILY TO  INJECT  INSULIN   Biotin 10000 MCG TABS Take 10,000 mcg by mouth daily.   Calcium Carb-Cholecalciferol (CALCIUM 600 + D PO) Take 2 tablets by mouth daily.   clotrimazole (LOTRIMIN) 1 % cream Apply to both feet and between toes twice daily for 6 weeks. (Patient not taking: Reported on 01/02/2022)   Continuous Blood Gluc Receiver (DEXCOM G6 RECEIVER) DEVI For sensor monitoring   Continuous Blood Gluc Sensor (DEXCOM G6 SENSOR) MISC Use to monitor blood sugar, change after 10 days   Continuous Blood Gluc Transmit (DEXCOM G6 TRANSMITTER) MISC 4 PER YEAR   cyclobenzaprine (FLEXERIL) 10 MG tablet Take 1 tablet (10 mg total) by mouth 2 (two) times daily as needed for muscle spasms.   cyclobenzaprine (FLEXERIL) 5 MG tablet Take 5 mg by mouth at bedtime as needed. (Patient not taking: Reported on 01/02/2022)   diazepam (VALIUM) 2 MG tablet  (Patient not taking: Reported on 01/02/2022)   diclofenac Sodium (VOLTAREN) 1 % GEL Apply 2-4 grams  to affected joint 4 times daily as needed.   doxycycline (VIBRAMYCIN) 100 MG capsule 2 po 1 hour prior to dental procedure   FARXIGA 10 MG TABS tablet TAKE 1 TABLET BY MOUTH ONCE DAILY BEFORE BREAKFAST   fluconazole (DIFLUCAN) 150 MG tablet Take 150 mg by mouth daily.   fluticasone (FLONASE) 50 MCG/ACT nasal spray Place 2 sprays into both nostrils 2 (two) times daily as needed for allergies or rhinitis.   furosemide (LASIX) 40 MG tablet Take 2 tablets by mouth once daily   Glucosamine  HCl-MSM (GLUCOSAMINE-MSM PO) Take 1 tablet by mouth daily.   glucose blood (ONETOUCH VERIO) test strip CHECK BLOOD SUGAR 4 TIMES DAILY   HYDROcodone-acetaminophen (NORCO/VICODIN) 5-325 MG tablet TAKE 1 TO 2 TABLETS BY MOUTH EVERY 6 HOURS AS NEEDED FOR MODERATE PAIN (PAIN SCORE 4-6) (Patient not taking: Reported on 01/02/2022)   Lancets (ONETOUCH DELICA PLUS [id number]) MISC USE TO CHECK BLOOD SUGAR UP TO THREE TIMES A DAY 2 HOURS AFTER A MEAL AND UP TO 3 TIMES A WEEK UPON WAKING UP   latanoprost (XALATAN) 0.005 % ophthalmic solution SMARTSIG:1 Drop(s) In Eye(s) Every Evening   leflunomide (ARAVA) 20 MG tablet Take 1 tablet by mouth once daily (Patient not taking: Reported on 01/02/2022)   levothyroxine (SYNTHROID) 112 MCG tablet TAKE 1 TABLET BY MOUTH BEFORE BREAKFAST   LINZESS 290 MCG CAPS capsule TAKE 1 CAPSULE BY MOUTH ONCE DAILY AS NEEDED   Magnesium 100 MG CAPS Take 1 capsule by mouth.   metFORMIN (GLUCOPHAGE) 1000 MG tablet TAKE 1 TABLET BY MOUTH TWICE DAILY WITH A MEAL   montelukast (SINGULAIR) 10 MG tablet TAKE 1 TABLET BY MOUTH AT BEDTIME   Multiple Vitamin (MULTIVITAMIN WITH MINERALS) TABS tablet Take 1 tablet by mouth daily.   NOVOLOG FLEXPEN 100 UNIT/ML FlexPen INJECT 10 TO 15 UNITS SUBCUTANEOUSLY THREE TIMES DAILY BEFORE MEAL(S)   omeprazole (PRILOSEC) 40 MG capsule Take 40 mg by mouth daily.   potassium chloride (KLOR-CON) 10 MEQ tablet TAKE 1  BY MOUTH ONCE DAILY   predniSONE (DELTASONE) 20 MG tablet 3 tabs po day one, then 2 tabs daily x 4 days (Patient not taking: Reported on 11/21/2021)   pregabalin (LYRICA) 100 MG capsule Take 100 mg by mouth 2 (two) times daily.   rosuvastatin (CRESTOR) 40 MG tablet Take 1 tablet by mouth once daily   traMADol (ULTRAM) 50 MG tablet Take 50 mg by mouth 3 (three) times daily as needed.   TRESIBA FLEXTOUCH 200 UNIT/ML FlexTouch Pen INJECT 36 UNITS SUBCUTANEOUSLY IN THE MORNING   valsartan (DIOVAN) 320 MG tablet Take 1 tablet (320 mg total) by mouth  daily.   [DISCONTINUED] Saxagliptin-Metformin 07-998 MG TB24 Take 1 tablet by mouth daily.   No facility-administered encounter medications on file as of 01/16/2022.    Allergies (verified) Contrast media [iodinated contrast media], Adhesive [tape], Erythromycin, and Penicillins   History: Past Medical History:  Diagnosis Date   Anemia    Arthritis    Bronchitis    hx of   Diabetes mellitus    type 2   Family history of adverse reaction to anesthesia    per patient, "daughter threw up after a knee surgery"   GERD (gastroesophageal reflux disease)    Hypercholesteremia    Hypertension    Hypothyroidism    Pneumonia    hx of   PONV (postoperative nausea and vomiting)    Thyroid disease    Past Surgical History:  Procedure Laterality Date   ABDOMINAL  HYSTERECTOMY     partial   BREAST SURGERY     lumpectomy left breast   carpel tunnel     COLONOSCOPY  07/2020   COLONOSCOPY W/ POLYPECTOMY     FOOT SURGERY     knee arhtroscopy     KNEE ARTHROPLASTY Left 05/2020   ORIF PATELLA Left 05/23/2020   Procedure: LEFT KNEE POLE PATELLA EXCISION;  Surgeon: [name], MD;  Location: [place];  Service: Orthopedics;  Laterality: Left;   SHOULDER ARTHROSCOPY WITH SUBACROMIAL DECOMPRESSION, ROTATOR CUFF REPAIR AND BICEP TENDON REPAIR Right 05/26/2012   Procedure: Right Shoulder Diagnostic Operative Arthroscopy, Subacromial Decompression, Biceps Tenodesis, Mini Open Rotator Cuff Repair;  Surgeon: [name], MD;  Location: [place];  Service: Orthopedics;  Laterality: Right;  Right Shoulder Diagnostic Operative Arthroscopy, Subacromial Decompression, Biceps Tenodesis, Mini Open Rotator Cuff Repair   TONSILLECTOMY     TOTAL KNEE ARTHROPLASTY Left 07/01/2019   Procedure: LEFT TOTAL KNEE ARTHROPLASTY;  Surgeon: [name], MD;  Location: [place];  Service: Orthopedics;  Laterality: Left;   UPPER GI ENDOSCOPY  07/2020   Family History  Problem Relation Age of Onset    Diabetes Mother    Heart disease Mother    Hyperlipidemia Mother    Diabetes Father    Diabetes Sister    Hypertension Sister    Heart disease Sister    Dementia Sister    Heart disease Brother    Healthy Daughter    Healthy Daughter    Healthy Daughter    Healthy Son    Social History   Socioeconomic History   Marital status: Married    Spouse name: Not on file   Number of children: Not on file   Years of education: Not on file   Highest education level: Not on file  Occupational History   Not on file  Tobacco Use   Smoking status: Former    Types: Cigarettes    Quit date: 04/01/1990    Years since quitting: 31.8    Passive exposure: Never   Smokeless tobacco: Never  Vaping Use   Vaping Use: Never used  Substance and Sexual Activity   Alcohol use: Not Currently    Comment: 06/22/2019: per patient about 20-25 years ago   Drug use: No   Sexual activity: Never    Birth control/protection: Abstinence  Other Topics Concern   Not on file  Social History Narrative   Not on file   Social Determinants of Health   Financial Resource Strain: Low Risk  (01/16/2022)   Overall Financial Resource Strain (CARDIA)    Difficulty of Paying Living Expenses: Not hard at all  Food Insecurity: No Food Insecurity (01/16/2022)   Hunger Vital Sign    Worried About Running Out of Food in the Last Year: Never true    Ran Out of Food in the Last Year: Never true  Transportation Needs: No Transportation Needs (01/16/2022)   PRAPARE - Hydrologist (Medical): No    Lack of Transportation (Non-Medical): No  Physical Activity: Inactive (01/16/2022)   Exercise Vital Sign    Days of Exercise per Week: 0 days    Minutes of Exercise per Session: 0 min  Stress: No Stress Concern Present (01/16/2022)   [place]    Feeling of Stress : Not at all  Social Connections: [place] (01/16/2022)    Social Connection and Isolation Panel [NHANES]  Frequency of Communication with Friends and Family: More than three times a week    Frequency of Social Gatherings with Friends and Family: More than three times a week    Attends Religious Services: More than 4 times per year    Active Member of [employer] or Organizations: Yes    Attends Music therapist: More than 4 times per year    Marital Status: Married    Tobacco Counseling Counseling given: Not Answered   Clinical Intake:  Pre-visit preparation completed: Yes  Pain : No/denies pain     BMI - recorded: 35.88 (01/02/2022) Nutritional Status: BMI > 30  Obese Nutritional Risks: None Diabetes: No  How often do you need to have someone help you when you read instructions, pamphlets, or other written materials from your doctor or pharmacy?: 1 - Never What is the last grade level you completed in school?: HSG  Nutrition Risk Assessment:  Has the patient had any N/V/D within the last 2 months?  No  Does the patient have any non-healing wounds?  No  Has the patient had any unintentional weight loss or weight gain?  No   Diabetes:  Is the patient diabetic?  Yes  If diabetic, was a CBG obtained today?  No  Did the patient bring in their glucometer from home?  No  How often do you monitor your CBG's? Dexcom G6.   Financial Strains and Diabetes Management:  Are you having any financial strains with the device, your supplies or your medication? No .  Does the patient want to be seen by Chronic Care Management for management of their diabetes?  No  Would the patient like to be referred to a Nutritionist or for Diabetic Management?  No   Diabetic Exams:  Diabetic Eye Exam: Completed 10/04/2021 Diabetic Foot Exam: Completed 01/15/2022   Interpreter Needed?: No  Information entered by :: Lisette Abu, LPN.   Activities of Daily Living    01/16/2022    9:43 AM  In your present state of health, do you have  any difficulty performing the following activities:  Hearing? 1  Vision? 0  Difficulty concentrating or making decisions? 1  Walking or climbing stairs? 0  Dressing or bathing? 0  Doing errands, shopping? 0  Preparing Food and eating ? N  Using the Toilet? N  In the past six months, have you accidently leaked urine? N  Do you have problems with loss of bowel control? N  Managing your Medications? N  Managing your Finances? N  Housekeeping or managing your Housekeeping? N    Patient Care Team: [name], MD as PCP - General (Internal Medicine) Elayne Snare, MD as Consulting Physician (Endocrinology) Idolina Primer Warnell Bureau as Consulting Physician (Optometry) Lady Gary, [facility] as Consulting Physician (Obstetrics and Gynecology)  Indicate any recent Medical Services you may have received from other than [facility] providers in the past year (date may be approximate).     Assessment:   This is a routine wellness examination for [name].  Hearing/Vision screen Hearing Screening - Comments:: Yes: hearing difficulties  No: hearing aids  Vision Screening - Comments:: Wears rx glasses - up to date with routine eye exams with Alois Cliche, OD every 6 months.   Dietary issues and exercise activities discussed: Current Exercise Habits: The patient does not participate in regular exercise at present, Exercise limited by: orthopedic condition(s)   Goals Addressed   None   Depression Screen    01/16/2022  9:39 AM 09/27/2021    9:34 AM 01/09/2021    9:55 AM 09/25/2020    9:15 AM 09/21/2019    8:58 AM 09/18/2018    8:57 AM 09/16/2017   11:41 AM  PHQ 2/9 Scores  PHQ - 2 Score 0 0 0 0 0 0 0  PHQ- 9 Score  2     2    Fall Risk    01/16/2022    9:35 AM 11/21/2021    9:30 AM 09/27/2021    9:34 AM 01/09/2021    9:57 AM 03/22/2020    9:00 AM  Fall Risk   Falls in the past year? '1 1 1 '$ 0 0  Number falls in past yr: 0 0 1 0 0  Injury with Fall? 0 0 0 0   Risk for fall  due to :  History of fall(s)  No Fall Risks History of fall(s)  Follow up Falls evaluation completed Falls evaluation completed  Falls evaluation completed Falls evaluation completed    [place]:  Any stairs in or around the home? No  If so, are there any without handrails? No  Home free of loose throw rugs in walkways, pet beds, electrical cords, etc? Yes  Adequate lighting in your home to reduce risk of falls? Yes   ASSISTIVE DEVICES UTILIZED TO PREVENT FALLS:  Life alert? No  Use of a cane, walker or w/c? No  Grab bars in the bathroom? Yes  Shower chair or bench in shower? No  Elevated toilet seat or a handicapped toilet? No   TIMED UP AND GO:  Was the test performed? No . Phone Visit  Cognitive Function:        01/16/2022    9:36 AM  6CIT Screen  What Year? 0 points  What month? 0 points  What time? 0 points  Count back from 20 0 points  Months in reverse 0 points  Repeat phrase 0 points  Total Score 0 points    Immunizations Immunization History  Administered Date(s) Administered   COVID-19, mRNA, vaccine(Comirnaty)12 years and older 01/08/2022   Fluad Quad(high Dose 65+) 01/02/2019, 12/31/2019   Influenza Split 01/03/2011   Influenza Whole 04/01/2005, 12/18/2009   Influenza, High Dose Seasonal PF 01/07/2014, 01/27/2015, 01/02/2016, 01/13/2017, 01/16/2018   Influenza-Unspecified 11/30/2012, 01/03/2021, 01/08/2022   PFIZER(Purple Top)SARS-COV-2 Vaccination 05/28/2019, 06/23/2019, 01/15/2020, 07/03/2020, 01/03/2021   Pneumococcal Conjugate-13 09/12/2016, 01/02/2019   Pneumococcal Polysaccharide-23 07/15/2014, 12/27/2019   Respiratory Syncytial Virus Vaccine,Recomb Aduvanted(Arexvy) 01/08/2022   Td 04/01/2001, 10/07/2013   Zoster Recombinat (Shingrix) 01/09/2021, 03/12/2021    TDAP status: Up to date  Flu Vaccine status: Up to date  Pneumococcal vaccine status: Up to date  Covid-19 vaccine status: Completed  vaccines  Qualifies for Shingles Vaccine? Yes   Zostavax completed No   Shingrix Completed?: Yes  Screening Tests Health Maintenance  Topic Date Due   COVID-19 Vaccine (6 - Pfizer risk series) 02/28/2021   MAMMOGRAM  04/20/2022   HEMOGLOBIN A1C  05/01/2022   Diabetic kidney evaluation - Urine ACR  05/31/2022   FOOT EXAM  09/28/2022   OPHTHALMOLOGY EXAM  10/05/2022   Diabetic kidney evaluation - GFR measurement  01/03/2023   COLONOSCOPY (Pts 45-67yr Insurance coverage will need to be confirmed)  08/24/2023   TETANUS/TDAP  10/08/2023   Pneumonia Vaccine 74 Years old  Completed   INFLUENZA VACCINE  Completed   DEXA SCAN  Completed   Hepatitis C Screening  Completed   Zoster Vaccines-  Shingrix  Completed   HPV VACCINES  Aged Out    Health Maintenance  Health Maintenance Due  Topic Date Due   COVID-19 Vaccine (6 - Pfizer risk series) 02/28/2021    Colorectal cancer screening: Type of screening: Colonoscopy. Completed 08/23/2020. Repeat every 5 years  Mammogram status: Completed 04/2021. Repeat every year (done at OB/GYN)  Bone Density status: Completed 04/2020. Results reflect: Bone density results: OSTEOPENIA. Repeat every 2 years. (done at OB/GYN)  Lung Cancer Screening: (Low Dose CT Chest recommended if Age 33-80 years, 30 pack-year currently smoking OR have quit w/in 15years.) does not qualify.   Lung Cancer Screening Referral: no  Additional Screening:  Hepatitis C Screening: does qualify; Completed 12/24/2018  Vision Screening: Recommended annual ophthalmology exams for early detection of glaucoma and other disorders of the eye. Is the patient up to date with their annual eye exam?  Yes  Who is the provider or what is the name of the office in which the patient attends annual eye exams? Alois Cliche, OD. (Every 6 months) If pt is not established with a provider, would they like to be referred to a provider to establish care? No .   Dental Screening: Recommended annual  dental exams for proper oral hygiene  Community Resource Referral / Chronic Care Management: CRR required this visit?  No   CCM required this visit?  No      Plan:     I have personally reviewed and noted the following in the patient's chart:   Medical and social history Use of alcohol, tobacco or illicit drugs  Current medications and supplements including opioid prescriptions. Patient is not currently taking opioid prescriptions. Functional ability and status Nutritional status Physical activity Advanced directives List of other physicians Hospitalizations, surgeries, and ER visits in previous 12 months Vitals Screenings to include cognitive, depression, and falls Referrals and appointments  In addition, I have reviewed and discussed with patient certain preventive protocols, quality metrics, and best practice recommendations. A written personalized care plan for preventive services as well as general preventive health recommendations were provided to patient.     Sheral Flow, LPN   [id number]   Nurse Notes: N/A

## 2022-01-16 NOTE — Patient Instructions (Signed)
[name] , Thank you for taking time to come for your Medicare Wellness Visit. I appreciate your ongoing commitment to your health goals. Please review the following plan we discussed and let me know if I can assist you in the future.   These are the goals we discussed:  Goals      Patient said she was stable and had good support. she declined Care Coordination support at this time     Care Coordination Interventions:  Active listening / Reflection utilized  Informed client of Care Coordination program support Client said she was doing well with her PCP support and with her family support . She declined Care Coordination support at this time     Patient Stated     Stay as healthy and as independent as possible. Enjoy life, family and grandchildren.        This is a list of the screening recommended for you and due dates:  Health Maintenance  Topic Date Due   COVID-19 Vaccine (6 - Pfizer risk series) 02/28/2021   Mammogram  04/20/2022   Hemoglobin A1C  05/01/2022   Yearly kidney health urinalysis for diabetes  05/31/2022   Complete foot exam   09/28/2022   Eye exam for diabetics  10/05/2022   Yearly kidney function blood test for diabetes  01/03/2023   Colon Cancer Screening  08/24/2023   Tetanus Vaccine  10/08/2023   Pneumonia Vaccine  Completed   Flu Shot  Completed   DEXA scan (bone density measurement)  Completed   Hepatitis C Screening: USPSTF Recommendation to screen - Ages 23-79 yo.  Completed   Zoster (Shingles) Vaccine  Completed   HPV Vaccine  Aged Out    Advanced directives: Yes; Please bring a copy of your health care power of attorney and living will to the office at your convenience.  Conditions/risks identified: Yes  Next appointment: Follow up in one year for your annual wellness visit on 01/20/2023 at 8:45 a.m. telephone visit with Mignon Pine, Nurse Health Advisor.  If you need to reschedule or cancel, please call [phone].   Preventive Care 40 Years  and Older, Female Preventive care refers to lifestyle choices and visits with your health care provider that can promote health and wellness. What does preventive care include? A yearly physical exam. This is also called an annual well check. Dental exams once or twice a year. Routine eye exams. Ask your health care provider how often you should have your eyes checked. Personal lifestyle choices, including: Daily care of your teeth and gums. Regular physical activity. Eating a healthy diet. Avoiding tobacco and drug use. Limiting alcohol use. Practicing safe sex. Taking low-dose aspirin every day. Taking vitamin and mineral supplements as recommended by your health care provider. What happens during an annual well check? The services and screenings done by your health care provider during your annual well check will depend on your age, overall health, lifestyle risk factors, and family history of disease. Counseling  Your health care provider may ask you questions about your: Alcohol use. Tobacco use. Drug use. Emotional well-being. Home and relationship well-being. Sexual activity. Eating habits. History of falls. Memory and ability to understand (cognition). Work and work Statistician. Reproductive health. Screening  You may have the following tests or measurements: Height, weight, and BMI. Blood pressure. Lipid and cholesterol levels. These may be checked every 5 years, or more frequently if you are over 75 years old. Skin check. Lung cancer screening. You may have this screening every  year starting at age 76 if you have a 30-pack-year history of smoking and currently smoke or have quit within the past 15 years. Fecal occult blood test (FOBT) of the stool. You may have this test every year starting at age 42. Flexible sigmoidoscopy or colonoscopy. You may have a sigmoidoscopy every 5 years or a colonoscopy every 10 years starting at age 77. Hepatitis C blood test. Hepatitis  B blood test. Sexually transmitted disease (STD) testing. Diabetes screening. This is done by checking your blood sugar (glucose) after you have not eaten for a while (fasting). You may have this done every 1-3 years. Bone density scan. This is done to screen for osteoporosis. You may have this done starting at age 45. Mammogram. This may be done every 1-2 years. Talk to your health care provider about how often you should have regular mammograms. Talk with your health care provider about your test results, treatment options, and if necessary, the need for more tests. Vaccines  Your health care provider may recommend certain vaccines, such as: Influenza vaccine. This is recommended every year. Tetanus, diphtheria, and acellular pertussis (Tdap, Td) vaccine. You may need a Td booster every 10 years. Zoster vaccine. You may need this after age 53. Pneumococcal 13-valent conjugate (PCV13) vaccine. One dose is recommended after age 23. Pneumococcal polysaccharide (PPSV23) vaccine. One dose is recommended after age 60. Talk to your health care provider about which screenings and vaccines you need and how often you need them. This information is not intended to replace advice given to you by your health care provider. Make sure you discuss any questions you have with your health care provider. Document Released: 04/14/2015 Document Revised: 12/06/2015 Document Reviewed: 01/17/2015 Elsevier Interactive Patient Education  2017 [place] Prevention in the Home Falls can cause injuries. They can happen to people of all ages. There are many things you can do to make your home safe and to help prevent falls. What can I do on the outside of my home? Regularly fix the edges of walkways and driveways and fix any cracks. Remove anything that might make you trip as you walk through a door, such as a raised step or threshold. Trim any bushes or trees on the path to your home. Use bright outdoor  lighting. Clear any walking paths of anything that might make someone trip, such as rocks or tools. Regularly check to see if handrails are loose or broken. Make sure that both sides of any steps have handrails. Any raised decks and porches should have guardrails on the edges. Have any leaves, snow, or ice cleared regularly. Use sand or salt on walking paths during winter. Clean up any spills in your garage right away. This includes oil or grease spills. What can I do in the bathroom? Use night lights. Install grab bars by the toilet and in the tub and shower. Do not use towel bars as grab bars. Use non-skid mats or decals in the tub or shower. If you need to sit down in the shower, use a plastic, non-slip stool. Keep the floor dry. Clean up any water that spills on the floor as soon as it happens. Remove soap buildup in the tub or shower regularly. Attach bath mats securely with double-sided non-slip rug tape. Do not have throw rugs and other things on the floor that can make you trip. What can I do in the bedroom? Use night lights. Make sure that you have a light by your bed that  is easy to reach. Do not use any sheets or blankets that are too big for your bed. They should not hang down onto the floor. Have a firm chair that has side arms. You can use this for support while you get dressed. Do not have throw rugs and other things on the floor that can make you trip. What can I do in the kitchen? Clean up any spills right away. Avoid walking on wet floors. Keep items that you use a lot in easy-to-reach places. If you need to reach something above you, use a strong step stool that has a grab bar. Keep electrical cords out of the way. Do not use floor polish or wax that makes floors slippery. If you must use wax, use non-skid floor wax. Do not have throw rugs and other things on the floor that can make you trip. What can I do with my stairs? Do not leave any items on the stairs. Make  sure that there are handrails on both sides of the stairs and use them. Fix handrails that are broken or loose. Make sure that handrails are as long as the stairways. Check any carpeting to make sure that it is firmly attached to the stairs. Fix any carpet that is loose or worn. Avoid having throw rugs at the top or bottom of the stairs. If you do have throw rugs, attach them to the floor with carpet tape. Make sure that you have a light switch at the top of the stairs and the bottom of the stairs. If you do not have them, ask someone to add them for you. What else can I do to help prevent falls? Wear shoes that: Do not have high heels. Have rubber bottoms. Are comfortable and fit you well. Are closed at the toe. Do not wear sandals. If you use a stepladder: Make sure that it is fully opened. Do not climb a closed stepladder. Make sure that both sides of the stepladder are locked into place. Ask someone to hold it for you, if possible. Clearly mark and make sure that you can see: Any grab bars or handrails. First and last steps. Where the edge of each step is. Use tools that help you move around (mobility aids) if they are needed. These include: Canes. Walkers. Scooters. Crutches. Turn on the lights when you go into a dark area. Replace any light bulbs as soon as they burn out. Set up your furniture so you have a clear path. Avoid moving your furniture around. If any of your floors are uneven, fix them. If there are any pets around you, be aware of where they are. Review your medicines with your doctor. Some medicines can make you feel dizzy. This can increase your chance of falling. Ask your doctor what other things that you can do to help prevent falls. This information is not intended to replace advice given to you by your health care provider. Make sure you discuss any questions you have with your health care provider. Document Released: 01/12/2009 Document Revised: 08/24/2015  Document Reviewed: 04/22/2014 Elsevier Interactive Patient Education  2017 [employer].

## 2022-01-30 DIAGNOSIS — E119 Type 2 diabetes mellitus without complications: Secondary | ICD-10-CM | POA: Diagnosis not present

## 2022-02-04 ENCOUNTER — Telehealth: Payer: Self-pay | Admitting: *Deleted

## 2022-02-04 NOTE — Telephone Encounter (Signed)
Patient contacted the office and left message stating she had stopped Arava due to thinking it was causing hair loss. Patient states she is still losing her hair even after stopping the [place] and states the hair loss is caused by something else. Patient states her jonts are hurting and she would like to know if she can restart the [place]. Please advise.

## 2022-02-04 NOTE — Telephone Encounter (Signed)
I would recommend having repeat CBC and CMP prior to restarting arava then return in 2 weeks to recheck lab work.  Ok to restart arava 20 mg 1 tablet daily.

## 2022-02-05 ENCOUNTER — Other Ambulatory Visit: Payer: Self-pay | Admitting: *Deleted

## 2022-02-05 DIAGNOSIS — Z79899 Other long term (current) drug therapy: Secondary | ICD-10-CM | POA: Diagnosis not present

## 2022-02-05 LAB — CBC WITH DIFFERENTIAL/PLATELET
Absolute Monocytes: 924 cells/uL (ref 200–950)
Basophils Absolute: 53 cells/uL (ref 0–200)
Basophils Relative: 0.5 %
Eosinophils Absolute: 200 cells/uL (ref 15–500)
Eosinophils Relative: 1.9 %
HCT: 37.8 % (ref 35.0–45.0)
Hemoglobin: 11.9 g/dL (ref 11.7–15.5)
Lymphs Abs: 3675 cells/uL (ref 850–3900)
MCH: 28 pg (ref 27.0–33.0)
MCHC: 31.5 g/dL — ABNORMAL LOW (ref 32.0–36.0)
MCV: 88.9 fL (ref 80.0–100.0)
MPV: 10.6 fL (ref 7.5–12.5)
Monocytes Relative: 8.8 %
Neutro Abs: 5649 cells/uL (ref 1500–7800)
Neutrophils Relative %: 53.8 %
Platelets: 290 10*3/uL (ref 140–400)
RBC: 4.25 10*6/uL (ref 3.80–5.10)
RDW: 13.9 % (ref 11.0–15.0)
Total Lymphocyte: 35 %
WBC: 10.5 10*3/uL (ref 3.8–10.8)

## 2022-02-05 LAB — COMPLETE METABOLIC PANEL WITH GFR
AG Ratio: 1.5 (calc) (ref 1.0–2.5)
ALT: 11 U/L (ref 6–29)
AST: 13 U/L (ref 10–35)
Albumin: 4 g/dL (ref 3.6–5.1)
Alkaline phosphatase (APISO): 53 U/L (ref 37–153)
BUN: 18 mg/dL (ref 7–25)
CO2: 30 mmol/L (ref 20–32)
Calcium: 10.2 mg/dL (ref 8.6–10.4)
Chloride: 105 mmol/L (ref 98–110)
Creat: 0.87 mg/dL (ref 0.60–1.00)
Globulin: 2.6 g/dL (calc) (ref 1.9–3.7)
Glucose, Bld: 127 mg/dL — ABNORMAL HIGH (ref 65–99)
Potassium: 4 mmol/L (ref 3.5–5.3)
Sodium: 143 mmol/L (ref 135–146)
Total Bilirubin: 0.3 mg/dL (ref 0.2–1.2)
Total Protein: 6.6 g/dL (ref 6.1–8.1)
eGFR: 70 mL/min/{1.73_m2} (ref >=60)

## 2022-02-05 NOTE — Telephone Encounter (Signed)
Patient advised [name], PA-C would recommend having repeat CBC and CMP prior to restarting arava then return in 2 weeks to recheck lab work.  Ok to restart arava 20 mg 1 tablet daily. Patient expressed understanding. Patient states she will come tomorrow and have labs. Standing orders in place.

## 2022-02-06 NOTE — Progress Notes (Signed)
Glucose is 127. Rest of CMP WNL.  CBC WNL.  Ok to restart [place].

## 2022-02-20 ENCOUNTER — Encounter (HOSPITAL_COMMUNITY): Payer: Self-pay

## 2022-02-20 ENCOUNTER — Ambulatory Visit (HOSPITAL_COMMUNITY)
Admission: RE | Admit: 2022-02-20 | Discharge: 2022-02-20 | Disposition: A | Discharge status: Home / Self Care | Payer: Medicare Other | Source: Ambulatory Visit | Attending: Family Medicine | Admitting: Family Medicine

## 2022-02-20 ENCOUNTER — Ambulatory Visit (INDEPENDENT_AMBULATORY_CARE_PROVIDER_SITE_OTHER): Payer: Medicare Other

## 2022-02-20 ENCOUNTER — Other Ambulatory Visit: Payer: Self-pay | Admitting: *Deleted

## 2022-02-20 VITALS — BP 152/71 | HR 80 | Temp 98.2°F | Resp 16

## 2022-02-20 DIAGNOSIS — R0789 Other chest pain: Secondary | ICD-10-CM | POA: Diagnosis not present

## 2022-02-20 DIAGNOSIS — Z79899 Other long term (current) drug therapy: Secondary | ICD-10-CM | POA: Diagnosis not present

## 2022-02-20 DIAGNOSIS — R0781 Pleurodynia: Secondary | ICD-10-CM | POA: Diagnosis not present

## 2022-02-20 DIAGNOSIS — R079 Chest pain, unspecified: Secondary | ICD-10-CM | POA: Diagnosis not present

## 2022-02-20 DIAGNOSIS — S32010A Wedge compression fracture of first lumbar vertebra, initial encounter for closed fracture: Secondary | ICD-10-CM

## 2022-02-20 DIAGNOSIS — R0602 Shortness of breath: Secondary | ICD-10-CM | POA: Diagnosis not present

## 2022-02-20 MED ORDER — HYDROCODONE-ACETAMINOPHEN 5-325 MG PO TABS
ORAL_TABLET | ORAL | Status: AC
  Filled 2022-02-20: qty 1

## 2022-02-20 MED ORDER — HYDROCODONE-ACETAMINOPHEN 5-325 MG PO TABS
1.0000 | ORAL_TABLET | Freq: Once | ORAL | Status: AC
  Administered 2022-02-20: 1 via ORAL

## 2022-02-20 MED ORDER — OXYCODONE HCL 5 MG PO TABS: ORAL_TABLET | ORAL | 0 refills | Status: DC

## 2022-02-20 NOTE — ED Triage Notes (Signed)
Chief Complaint: Patient fell on her way here to be seen. Patient was coming because of rib pain with breathing. The fall caused back pain and laceration to the right ring finger.    Patient has tried treating herself OTC for constipate and gas.  Onset: Onset this past weekend rib pain and SOB. Fall this afternoon.   OTC medications tried: Yes- Linzess    with little relief  New foods or medications: No  Recent Travel: No

## 2022-02-20 NOTE — Discharge Instructions (Addendum)
You have been seen at the [facility] today for chest/chest wall pain. Your evaluation today was not suggestive of any emergent condition requiring medical intervention at this time. Your ECG (heart tracing) did not show any worrisome changes. However, some medical problems make take more time to appear. Therefore, it's very important that you pay attention to any new symptoms or worsening of your current condition.  Please proceed directly to the Emergency Department immediately should you feel worse in any way.  Follow up with orthopaedics for what appears to be a new vertebral compression L1 fracture.  [facility] [address], [place], [place] [address] Phone: [phone]  [facility]: Walk in M-F 8 am to 8 pm [place], [place], [place] [address] Phone: [phone]  [facility]: By Appointment M-F 8 - 5 pm Walk-in M-F 5:30 - 9 pm, Saturday 9 am - 2 pm, Sunday 10 am - 2 pm [place], [place], [place] [address] Phone: [phone]

## 2022-02-21 LAB — CBC WITH DIFFERENTIAL/PLATELET
Absolute Monocytes: 774 {cells}/uL (ref 200–950)
Basophils Absolute: 44 {cells}/uL (ref 0–200)
Basophils Relative: 0.5 %
Eosinophils Absolute: 202 {cells}/uL (ref 15–500)
Eosinophils Relative: 2.3 %
HCT: 37.1 % (ref 35.0–45.0)
Hemoglobin: 12 g/dL (ref 11.7–15.5)
Lymphs Abs: 3291 {cells}/uL (ref 850–3900)
MCH: 27.8 pg (ref 27.0–33.0)
MCHC: 32.3 g/dL (ref 32.0–36.0)
MCV: 86.1 fL (ref 80.0–100.0)
MPV: 10.8 fL (ref 7.5–12.5)
Monocytes Relative: 8.8 %
Neutro Abs: 4488 {cells}/uL (ref 1500–7800)
Neutrophils Relative %: 51 %
Platelets: 294 Thousand/uL (ref 140–400)
RBC: 4.31 Million/uL (ref 3.80–5.10)
RDW: 13.5 % (ref 11.0–15.0)
Total Lymphocyte: 37.4 %
WBC: 8.8 Thousand/uL (ref 3.8–10.8)

## 2022-02-21 LAB — COMPLETE METABOLIC PANEL WITHOUT GFR
AG Ratio: 1.4 (calc) (ref 1.0–2.5)
ALT: 12 U/L (ref 6–29)
AST: 14 U/L (ref 10–35)
Albumin: 4.1 g/dL (ref 3.6–5.1)
Alkaline phosphatase (APISO): 56 U/L (ref 37–153)
BUN: 11 mg/dL (ref 7–25)
CO2: 27 mmol/L (ref 20–32)
Calcium: 10 mg/dL (ref 8.6–10.4)
Chloride: 106 mmol/L (ref 98–110)
Creat: 0.63 mg/dL (ref 0.60–1.00)
Globulin: 2.9 g/dL (ref 1.9–3.7)
Glucose, Bld: 80 mg/dL (ref 65–99)
Potassium: 4.2 mmol/L (ref 3.5–5.3)
Sodium: 142 mmol/L (ref 135–146)
Total Bilirubin: 0.2 mg/dL (ref 0.2–1.2)
Total Protein: 7 g/dL (ref 6.1–8.1)
eGFR: 93 mL/min/1.73m2

## 2022-02-21 NOTE — Progress Notes (Signed)
CBC and CMP normal

## 2022-02-25 ENCOUNTER — Other Ambulatory Visit: Payer: Self-pay

## 2022-02-25 DIAGNOSIS — M545 Low back pain, unspecified: Secondary | ICD-10-CM | POA: Insufficient documentation

## 2022-02-25 DIAGNOSIS — F419 Anxiety disorder, unspecified: Secondary | ICD-10-CM | POA: Diagnosis not present

## 2022-02-25 DIAGNOSIS — M4856XA Collapsed vertebra, not elsewhere classified, lumbar region, initial encounter for fracture: Secondary | ICD-10-CM | POA: Diagnosis not present

## 2022-02-25 MED ORDER — LEFLUNOMIDE 20 MG PO TABS: 20.0000 mg | ORAL_TABLET | Freq: Every day | ORAL | 0 refills | Status: DC

## 2022-02-25 NOTE — Telephone Encounter (Signed)
Patient requested a refill of arava to [facility] since she has resumed taking the medication.   Next Visit: 04/03/2022  Last Visit: 01/02/2022  Last Fill: 11/05/2021  DX: Rheumatoid arthritis involving multiple sites with positive rheumatoid factor   Current Dose per office note on 01/02/2022: She discontinued Humira and leflunomide after her last office visit on 09/18/2021.  Leflunomide was discontinued due to ongoing hair loss.     Labs: 02/20/2022 CBC and CMP normal.   Okay to refill arava?

## 2022-02-26 NOTE — ED Provider Notes (Signed)
[place]   [id number] 02/20/22 Arrival Time: 2353  ASSESSMENT & PLAN:  1. Chest wall pain   2. Compression fracture of L1 vertebra, initial encounter ([place])    I have personally viewed the imaging studies ordered this visit. Agree with radiology report. No lung pathology. Apparent L1 compression fracture.  DG Chest 2 View  Result Date: 02/20/2022 CLINICAL DATA:  Provided history: Lower rib pain. Shortness of breath. Additional history provided: Patient reports pain with deep breaths. EXAM: CHEST - 2 VIEW COMPARISON:  Prior chest radiographs 05/25/2017 and earlier. Lumbar spine MRI 06/30/2021. FINDINGS: Heart size within normal limits. Aortic atherosclerosis. No appreciable airspace consolidation. No evidence of pleural effusion or pneumothorax. Vertebral compression fracture with mild height loss at the thoracolumbar junction (tentatively the L1 level), new from the prior lumbar spine MRI of 06/30/2021 but otherwise age indeterminate. Correlate for point tenderness at this site. Degenerative changes of the spine. These results will be called to the ordering clinician or representative by the Radiologist Assistant, and communication documented in the PACS or [employer]. IMPRESSION: No evidence of acute cardiopulmonary abnormality. Vertebral compression fracture with mild height loss at the thoracolumbar junction (tentatively the L1 level), new from the prior lumbar spine MRI of 06/30/2021 but otherwise age indeterminate. Correlate for point tenderness at this site. Aortic Atherosclerosis (ICD10-I70.0). Electronically Signed   By: Kellie Simmering D.O.   On: 02/20/2022 16:31    Able to ambulate here and hemodynamically stable. Normal LE neuro/vasc exam.  Meds ordered this encounter  Medications   HYDROcodone-acetaminophen (NORCO/VICODIN) 5-325 MG per tablet 1 tablet   oxyCODONE (ROXICODONE) 5 MG immediate release tablet    Sig: Take 1/2 -1 tablet every 6 hours for severe pain.     Dispense:  15 tablet    Refill:  0   [place] Controlled Substances Registry consulted for this patient. I feel the risk/benefit ratio today is favorable for proceeding with this prescription for a controlled substance. Medication sedation precautions given.  Medication sedation precautions given. Encourage ROM/movement as tolerated.  Recommend:  Follow-up Information     Schedule an appointment as soon as possible for a visit  with [name], MD.   Specialty: Internal Medicine Contact information: [place] [address] [phone]                  Discharge Instructions      You have been seen at the [facility] today for chest/chest wall pain. Your evaluation today was not suggestive of any emergent condition requiring medical intervention at this time. Your ECG (heart tracing) did not show any worrisome changes. However, some medical problems make take more time to appear. Therefore, it's very important that you pay attention to any new symptoms or worsening of your current condition.  Please proceed directly to the Emergency Department immediately should you feel worse in any way.  Follow up with orthopaedics for what appears to be a new vertebral compression L1 fracture.  [facility] [address], [place], [place] [address] Phone: [phone]  [facility]: Walk in M-F 8 am to 8 pm [place], [place], [place] [address] Phone: [phone]  [facility]: By Appointment M-F 8 - 5 pm Walk-in M-F 5:30 - 9 pm, Saturday 9 am - 2 pm, Sunday 10 am - 2 pm [place], [place], [place] [address] Phone: [phone]      Reviewed expectations re: course  of current medical issues. Questions answered. Outlined signs and symptoms indicating need for more acute intervention. Patient verbalized understanding. After Visit Summary given.   SUBJECTIVE: History from:  patient.  Marissa Roberts is a 76 y.o. female who reports fall today; onto buttocks from standing position; with reported low back pain and "rib pain" on the LEFT, more so with deep breaths. Ambulatory without difficulty. No extremity sensation changes or weakness. Normal bladder habits. No abdominal pain. No n/v.   OBJECTIVE:  Vitals:   02/20/22 1559  BP: (!) 152/71  Pulse: 80  Resp: 16  Temp: 98.2 F (36.8 C)  TempSrc: Oral  SpO2: 95%    General appearance: alert; no distress HEENT: [place]; AT Neck: supple with FROM; without midline tenderness CV: regular Lungs: unlabored respirations; speaks full sentences without difficulty; CTAB Chest wall: is TTP over L chest wall; no bruising Abdomen: soft, non-tender; non-distended Back: mild  and poorly localized tenderness to palpation over low back; with midline tenderness ; FROM at waist; bruising: none Extremities: without edema; symmetrical without gross deformities; normal ROM of all extremities Skin: warm and dry Neurologic: normal gait; normal sensation and strength of all extremities Psychological: alert and cooperative; normal mood and affect  Labs: Results for orders placed or performed in visit on 02/20/22  COMPLETE METABOLIC PANEL WITH GFR  Result Value Ref Range   Glucose, Bld 80 65 - 99 mg/dL   BUN 11 7 - 25 mg/dL   Creat 0.63 0.60 - 1.00 mg/dL   eGFR 93 > OR = 60 mL/min/1.81m   BUN/Creatinine Ratio SEE NOTE: 6 - 22 (calc)   Sodium 142 135 - 146 mmol/L   Potassium 4.2 3.5 - 5.3 mmol/L   Chloride 106 98 - 110 mmol/L   CO2 27 20 - 32 mmol/L   Calcium 10.0 8.6 - 10.4 mg/dL   Total Protein 7.0 6.1 - 8.1 g/dL   Albumin 4.1 3.6 - 5.1 g/dL   Globulin 2.9 1.9 - 3.7 g/dL (calc)   AG Ratio 1.4 1.0 - 2.5 (calc)   Total Bilirubin 0.2 0.2 - 1.2 mg/dL   Alkaline phosphatase (APISO) 56 37 - 153 U/L   AST 14 10 - 35 U/L   ALT 12 6 - 29 U/L  CBC with Differential/Platelet  Result Value Ref Range   WBC 8.8 3.8 - 10.8  Thousand/uL   RBC 4.31 3.80 - 5.10 Million/uL   Hemoglobin 12.0 11.7 - 15.5 g/dL   HCT 37.1 35.0 - 45.0 %   MCV 86.1 80.0 - 100.0 fL   MCH 27.8 27.0 - 33.0 pg   MCHC 32.3 32.0 - 36.0 g/dL   RDW 13.5 11.0 - 15.0 %   Platelets 294 140 - 400 Thousand/uL   MPV 10.8 7.5 - 12.5 fL   Neutro Abs 4,488 1,500 - 7,800 cells/uL   Lymphs Abs 3,291 850 - 3,900 cells/uL   Absolute Monocytes 774 200 - 950 cells/uL   Eosinophils Absolute 202 15 - 500 cells/uL   Basophils Absolute 44 0 - 200 cells/uL   Neutrophils Relative % 51 %   Total Lymphocyte 37.4 %   Monocytes Relative 8.8 %   Eosinophils Relative 2.3 %   Basophils Relative 0.5 %     Imaging: DG Chest 2 View  Result Date: 02/20/2022 CLINICAL DATA:  Provided history: Lower rib pain. Shortness of breath. Additional history provided: Patient reports pain with deep breaths. EXAM: CHEST - 2 VIEW COMPARISON:  Prior chest radiographs 05/25/2017 and earlier. Lumbar  spine MRI 06/30/2021. FINDINGS: Heart size within normal limits. Aortic atherosclerosis. No appreciable airspace consolidation. No evidence of pleural effusion or pneumothorax. Vertebral compression fracture with mild height loss at the thoracolumbar junction (tentatively the L1 level), new from the prior lumbar spine MRI of 06/30/2021 but otherwise age indeterminate. Correlate for point tenderness at this site. Degenerative changes of the spine. These results will be called to the ordering clinician or representative by the Radiologist Assistant, and communication documented in the PACS or [employer]. IMPRESSION: No evidence of acute cardiopulmonary abnormality. Vertebral compression fracture with mild height loss at the thoracolumbar junction (tentatively the L1 level), new from the prior lumbar spine MRI of 06/30/2021 but otherwise age indeterminate. Correlate for point tenderness at this site. Aortic Atherosclerosis (ICD10-I70.0). Electronically Signed   By: Kellie Simmering D.O.   On:  02/20/2022 16:31    Allergies  Allergen Reactions   Contrast Media [Iodinated Contrast Media]     Throat swells from ct contra   Adhesive [Tape]     No paper tape, causes blisters   Erythromycin Rash   Penicillins Rash    Did it involve swelling of the face/tongue/throat, SOB, or low BP? No Did it involve sudden or severe rash/hives, skin peeling, or any reaction on the inside of your mouth or nose? No Did you need to seek medical attention at a hospital or doctor's office? No When did it last happen?    Many years If all above answers are "NO", may proceed with cephalosporin use.     Past Medical History:  Diagnosis Date   Anemia    Arthritis    Bronchitis    hx of   Diabetes mellitus    type 2   Family history of adverse reaction to anesthesia    per patient, "daughter threw up after a knee surgery"   GERD (gastroesophageal reflux disease)    Hypercholesteremia    Hypertension    Hypothyroidism    Pneumonia    hx of   PONV (postoperative nausea and vomiting)    Thyroid disease    Social History   Socioeconomic History   Marital status: Married    Spouse name: Not on file   Number of children: Not on file   Years of education: Not on file   Highest education level: Not on file  Occupational History   Not on file  Tobacco Use   Smoking status: Former    Types: Cigarettes    Quit date: 04/01/1990    Years since quitting: 31.9    Passive exposure: Never   Smokeless tobacco: Never  Vaping Use   Vaping Use: Never used  Substance and Sexual Activity   Alcohol use: Not Currently    Comment: 06/22/2019: per patient about 20-25 years ago   Drug use: No   Sexual activity: Never    Birth control/protection: Abstinence  Other Topics Concern   Not on file  Social History Narrative   Not on file   Social Determinants of Health   Financial Resource Strain: Low Risk  (01/16/2022)   Overall Financial Resource Strain (CARDIA)    Difficulty of Paying Living  Expenses: Not hard at all  Food Insecurity: No Food Insecurity (01/16/2022)   Hunger Vital Sign    Worried About Running Out of Food in the Last Year: Never true    Ran Out of Food in the Last Year: Never true  Transportation Needs: No Transportation Needs (01/16/2022)   PRAPARE - Transportation  Lack of Transportation (Medical): No    Lack of Transportation (Non-Medical): No  Physical Activity: Inactive (01/16/2022)   Exercise Vital Sign    Days of Exercise per Week: 0 days    Minutes of Exercise per Session: 0 min  Stress: No Stress Concern Present (01/16/2022)   [place]    Feeling of Stress : Not at all  Social Connections: [place] (01/16/2022)   Social Connection and Isolation Panel [NHANES]    Frequency of Communication with Friends and Family: More than three times a week    Frequency of Social Gatherings with Friends and Family: More than three times a week    Attends Religious Services: More than 4 times per year    Active Member of Clubs or Organizations: Yes    Attends Archivist Meetings: More than 4 times per year    Marital Status: Married  Human resources officer Violence: Not At Risk (01/16/2022)   Humiliation, Afraid, Rape, and Kick questionnaire    Fear of Current or Ex-Partner: No    Emotionally Abused: No    Physically Abused: No    Sexually Abused: No   Family History  Problem Relation Age of Onset   Diabetes Mother    Heart disease Mother    Hyperlipidemia Mother    Diabetes Father    Diabetes Sister    Hypertension Sister    Heart disease Sister    Dementia Sister    Heart disease Brother    Healthy Daughter    Healthy Daughter    Healthy Daughter    Healthy Son    Past Surgical History:  Procedure Laterality Date   ABDOMINAL HYSTERECTOMY     partial   BREAST SURGERY     lumpectomy left breast   carpel tunnel     COLONOSCOPY  07/2020   COLONOSCOPY W/  POLYPECTOMY     FOOT SURGERY     knee arhtroscopy     KNEE ARTHROPLASTY Left 05/2020   ORIF PATELLA Left 05/23/2020   Procedure: LEFT KNEE POLE PATELLA EXCISION;  Surgeon: [name], MD;  Location: [facility];  Service: Orthopedics;  Laterality: Left;   SHOULDER ARTHROSCOPY WITH SUBACROMIAL DECOMPRESSION, ROTATOR CUFF REPAIR AND BICEP TENDON REPAIR Right 05/26/2012   Procedure: Right Shoulder Diagnostic Operative Arthroscopy, Subacromial Decompression, Biceps Tenodesis, Mini Open Rotator Cuff Repair;  Surgeon: [name], MD;  Location: [facility];  Service: Orthopedics;  Laterality: Right;  Right Shoulder Diagnostic Operative Arthroscopy, Subacromial Decompression, Biceps Tenodesis, Mini Open Rotator Cuff Repair   TONSILLECTOMY     TOTAL KNEE ARTHROPLASTY Left 07/01/2019   Procedure: LEFT TOTAL KNEE ARTHROPLASTY;  Surgeon: [name], MD;  Location: [facility];  Service: Orthopedics;  Laterality: Left;   UPPER GI ENDOSCOPY  07/2020      Vanessa Kick, MD 02/26/22 1021

## 2022-03-05 ENCOUNTER — Other Ambulatory Visit (INDEPENDENT_AMBULATORY_CARE_PROVIDER_SITE_OTHER): Payer: Medicare Other

## 2022-03-05 DIAGNOSIS — E1165 Type 2 diabetes mellitus with hyperglycemia: Secondary | ICD-10-CM

## 2022-03-05 DIAGNOSIS — Z794 Long term (current) use of insulin: Secondary | ICD-10-CM | POA: Diagnosis not present

## 2022-03-05 LAB — BASIC METABOLIC PANEL
BUN: 17 mg/dL (ref 6–23)
CO2: 28 mEq/L (ref 19–32)
Calcium: 10.3 mg/dL (ref 8.4–10.5)
Chloride: 99 mEq/L (ref 96–112)
Creatinine, Ser: 0.73 mg/dL (ref 0.40–1.20)
GFR: 80.95 mL/min (ref >60.00)
Glucose, Bld: 97 mg/dL (ref 70–99)
Potassium: 4 mEq/L (ref 3.5–5.1)
Sodium: 137 mEq/L (ref 135–145)

## 2022-03-05 LAB — HEMOGLOBIN A1C: Hgb A1c MFr Bld: 6.6 % — ABNORMAL HIGH (ref 4.6–6.5)

## 2022-03-07 ENCOUNTER — Ambulatory Visit: Payer: Medicare Other | Admitting: Endocrinology

## 2022-03-08 ENCOUNTER — Encounter: Payer: Self-pay | Admitting: Endocrinology

## 2022-03-08 ENCOUNTER — Ambulatory Visit (INDEPENDENT_AMBULATORY_CARE_PROVIDER_SITE_OTHER): Payer: Medicare Other | Admitting: Endocrinology

## 2022-03-08 VITALS — BP 138/70 | HR 82 | Wt 210.0 lb

## 2022-03-08 DIAGNOSIS — E063 Autoimmune thyroiditis: Secondary | ICD-10-CM | POA: Diagnosis not present

## 2022-03-08 DIAGNOSIS — E1165 Type 2 diabetes mellitus with hyperglycemia: Secondary | ICD-10-CM

## 2022-03-08 DIAGNOSIS — I10 Essential (primary) hypertension: Secondary | ICD-10-CM

## 2022-03-08 DIAGNOSIS — Z794 Long term (current) use of insulin: Secondary | ICD-10-CM | POA: Diagnosis not present

## 2022-03-08 NOTE — Patient Instructions (Signed)
Tyler Aas 32 daily  Farxiga '10mg'$  1/2 daily

## 2022-03-08 NOTE — Progress Notes (Signed)
Patient ID: Marissa Roberts, female   DOB: 07/08/1945, 76 y.o.   MRN: [id number]               Reason for Appointment:  Follow-up for endocrine problems   History of Present Illness:             Type 2 diabetes mellitus, date of diagnosis: 2005       INSULIN regimen is  Tresiba 36 units am, Novolog rarely  Oral hypoglycemic drugs the patient is taking are: Metformin 2 g daily, Farxiga 10 mg, half daily   Current blood sugar patterns, management and problems identified:  Her A1c is 6.6 compared to 6.1 Fructosamine last 194  She did have some steroids till about a month ago which may have raised her A1c  However she has not cut back on her Tresiba back to 32 as yet  Currently is not having any hypoglycemia overnight and blood sugars are fairly good overnight with usual occasional postprandial spikes based on her diet She is trying to cut back on adding much sugar to her ice tea but will occasionally have regular soft drink  Still not able to get up and move around very much and recently having back pain She is taking a half of the Farxiga partly to reduce cost but periodically has had vaginal candidiasis and occasionally has taken Diflucan from PCP  Dinner usually at 6-7pm  Side effects from medications have been:none  Analysis of the [facility] G6 download for the last 2 weeks through 12/6 as follows  Lowest readings are overnight and highest readings are after lunch or dinner  Overnight blood sugars are very steady and averaging about 110 early morning without any hypoglycemia Premeal readings are mildly higher at dinnertime compared to breakfast and lunch  POSTPRANDIAL readings are going above the 180 target 2-4 times per week on her download with less hyperglycemia in the second week  No hypoglycemia at any time   CGM use % of time 93  2-week average/GV 119  Time in range      93  %  % Time Above 180 3  % Time above 250   % Time Below 70      PRE-MEAL Fasting Lunch  Dinner Bedtime Overall  Glucose range:       Averages: 110       Prior   CGM use % of time 93  2-week average/GV 110  Time in range   97     %  % Time Above 180 2  % Time above 250   % Time Below 70 1     PRE-MEAL Fasting Lunch Dinner Bedtime Overall  Glucose range: 94      Averages:          Self-care:   Meals: 3 meals per day. Breakfast is usually grits with or without eggs or meat Not always restricting fat intake or sweet tea  when eating out      Dietician visit, most recent:2014.               Weight history:  Wt Readings from Last 3 Encounters:  03/08/22 210 lb (95.3 kg)  01/02/22 215 lb 9.6 oz (97.8 kg)  11/21/21 210 lb 2 oz (95.3 kg)    Glycemic control:   Lab Results  Component Value Date   HGBA1C 6.6 (H) 03/05/2022   HGBA1C 6.1 10/29/2021   HGBA1C 7.0 (H) 05/18/2021   Lab Results  Component Value Date  MICROALBUR 9.7 (H) 05/30/2021   LDLCALC 61 08/24/2019   CREATININE 0.73 03/05/2022   Lab Results  Component Value Date   FRUCTOSAMINE 194 07/30/2021      Past history:  She thinks her blood sugars were only mildly increased at borderline levels at the onsetper Most likely she was treated with metformin initially and this has been continued.   At some point she was also tried on [place] and [place] for improving her control. She is not sure why she was started on insulin 5 years ago, presumably for worsening hyperglycemia Also not clear if she has tried various insulin regimens before starting Lantus and NovoLog  Previously had tried the V-go pump but subsequently she stopped doing the V-go pump because of the cost, skin irritation and local discomfort   Allergies as of 03/08/2022       Reactions   Contrast Media [iodinated Contrast Media]    Throat swells from ct contra   Adhesive [tape]    No paper tape, causes blisters   Erythromycin Rash   Penicillins Rash   Did it involve swelling of the face/tongue/throat, SOB, or low BP?  No Did it involve sudden or severe rash/hives, skin peeling, or any reaction on the inside of your mouth or nose? No Did you need to seek medical attention at a hospital or doctor's office? No When did it last happen?    Many years If all above answers are "NO", may proceed with cephalosporin use.        Medication List        Accurate as of March 08, 2022 10:30 AM. If you have any questions, ask your nurse or doctor.          amLODipine 10 MG tablet Commonly known as: NORVASC Take 1 tablet by mouth once daily   BD Pen Needle Nano U/F 32G X 4 MM Misc Generic drug: Insulin Pen Needle USE 1  4 TIMES DAILY TO  INJECT  INSULIN   Biotin 10000 MCG Tabs Take 10,000 mcg by mouth daily.   CALCIUM 600 + D PO Take 2 tablets by mouth daily.   clotrimazole 1 % cream Commonly known as: LOTRIMIN Apply to both feet and between toes twice daily for 6 weeks.   cyclobenzaprine 5 MG tablet Commonly known as: FLEXERIL Take 5 mg by mouth at bedtime as needed.   cyclobenzaprine 10 MG tablet Commonly known as: FLEXERIL Take 1 tablet (10 mg total) by mouth 2 (two) times daily as needed for muscle spasms.   Dexcom G6 Receiver Devi For sensor monitoring   Dexcom G6 Sensor Misc Use to monitor blood sugar, change after 10 days   Dexcom G6 Transmitter Misc 4 PER YEAR   diazepam 2 MG tablet Commonly known as: VALIUM   diclofenac Sodium 1 % Gel Commonly known as: VOLTAREN Apply 2-4 grams to affected joint 4 times daily as needed.   doxycycline 100 MG capsule Commonly known as: VIBRAMYCIN 2 po 1 hour prior to dental procedure   Farxiga 10 MG Tabs tablet Generic drug: dapagliflozin propanediol TAKE 1 TABLET BY MOUTH ONCE DAILY BEFORE BREAKFAST   fluconazole 150 MG tablet Commonly known as: DIFLUCAN Take 150 mg by mouth daily.   fluticasone 50 MCG/ACT nasal spray Commonly known as: FLONASE Place 2 sprays into both nostrils 2 (two) times daily as needed for allergies or  rhinitis.   furosemide 40 MG tablet Commonly known as: LASIX Take 2 tablets by mouth once daily  GLUCOSAMINE-MSM PO Take 1 tablet by mouth daily.   latanoprost 0.005 % ophthalmic solution Commonly known as: XALATAN SMARTSIG:1 Drop(s) In Eye(s) Every Evening   leflunomide 20 MG tablet Commonly known as: ARAVA Take 1 tablet (20 mg total) by mouth daily.   levothyroxine 112 MCG tablet Commonly known as: SYNTHROID TAKE 1 TABLET BY MOUTH BEFORE BREAKFAST   Linzess 290 MCG Caps capsule Generic drug: linaclotide TAKE 1 CAPSULE BY MOUTH ONCE DAILY AS NEEDED   Magnesium 100 MG Caps Take 1 capsule by mouth.   metFORMIN 1000 MG tablet Commonly known as: GLUCOPHAGE TAKE 1 TABLET BY MOUTH TWICE DAILY WITH A MEAL   montelukast 10 MG tablet Commonly known as: SINGULAIR TAKE 1 TABLET BY MOUTH AT BEDTIME   multivitamin with minerals Tabs tablet Take 1 tablet by mouth daily.   NovoLOG FlexPen 100 UNIT/ML FlexPen Generic drug: insulin aspart INJECT 10 TO 15 UNITS SUBCUTANEOUSLY THREE TIMES DAILY BEFORE MEAL(S)   omeprazole 40 MG capsule Commonly known as: PRILOSEC Take 40 mg by mouth daily.   OneTouch Delica Plus [id number] Misc USE TO CHECK BLOOD SUGAR UP TO THREE TIMES A DAY 2 HOURS AFTER A MEAL AND UP TO 3 TIMES A WEEK UPON WAKING UP   OneTouch Verio test strip Generic drug: glucose blood CHECK BLOOD SUGAR 4 TIMES DAILY   oxyCODONE 5 MG immediate release tablet Commonly known as: Roxicodone Take 1/2 -1 tablet every 6 hours for severe pain.   potassium chloride 10 MEQ tablet Commonly known as: KLOR-CON TAKE 1  BY MOUTH ONCE DAILY   predniSONE 20 MG tablet Commonly known as: DELTASONE 3 tabs po day one, then 2 tabs daily x 4 days   pregabalin 100 MG capsule Commonly known as: LYRICA Take 100 mg by mouth 2 (two) times daily.   rosuvastatin 40 MG tablet Commonly known as: CRESTOR Take 1 tablet by mouth once daily   Tresiba FlexTouch 200 UNIT/ML FlexTouch  Pen Generic drug: insulin degludec INJECT 36 UNITS SUBCUTANEOUSLY IN THE MORNING   valsartan 320 MG tablet Commonly known as: DIOVAN Take 1 tablet (320 mg total) by mouth daily.        Allergies:  Allergies  Allergen Reactions   Contrast Media [Iodinated Contrast Media]     Throat swells from ct contra   Adhesive [Tape]     No paper tape, causes blisters   Erythromycin Rash   Penicillins Rash    Did it involve swelling of the face/tongue/throat, SOB, or low BP? No Did it involve sudden or severe rash/hives, skin peeling, or any reaction on the inside of your mouth or nose? No Did you need to seek medical attention at a hospital or doctor's office? No When did it last happen?    Many years If all above answers are "NO", may proceed with cephalosporin use.     Past Medical History:  Diagnosis Date   Anemia    Arthritis    Bronchitis    hx of   Diabetes mellitus    type 2   Family history of adverse reaction to anesthesia    per patient, "daughter threw up after a knee surgery"   GERD (gastroesophageal reflux disease)    Hypercholesteremia    Hypertension    Hypothyroidism    Pneumonia    hx of   PONV (postoperative nausea and vomiting)    Thyroid disease     Past Surgical History:  Procedure Laterality Date   ABDOMINAL HYSTERECTOMY  partial   BREAST SURGERY     lumpectomy left breast   carpel tunnel     COLONOSCOPY  07/2020   COLONOSCOPY W/ POLYPECTOMY     FOOT SURGERY     knee arhtroscopy     KNEE ARTHROPLASTY Left 05/2020   ORIF PATELLA Left 05/23/2020   Procedure: LEFT KNEE POLE PATELLA EXCISION;  Surgeon: [name], MD;  Location: [facility];  Service: Orthopedics;  Laterality: Left;   SHOULDER ARTHROSCOPY WITH SUBACROMIAL DECOMPRESSION, ROTATOR CUFF REPAIR AND BICEP TENDON REPAIR Right 05/26/2012   Procedure: Right Shoulder Diagnostic Operative Arthroscopy, Subacromial Decompression, Biceps Tenodesis, Mini Open Rotator Cuff Repair;  Surgeon:  [name], MD;  Location: [place];  Service: Orthopedics;  Laterality: Right;  Right Shoulder Diagnostic Operative Arthroscopy, Subacromial Decompression, Biceps Tenodesis, Mini Open Rotator Cuff Repair   TONSILLECTOMY     TOTAL KNEE ARTHROPLASTY Left 07/01/2019   Procedure: LEFT TOTAL KNEE ARTHROPLASTY;  Surgeon: [name], MD;  Location: [place];  Service: Orthopedics;  Laterality: Left;   UPPER GI ENDOSCOPY  07/2020    Family History  Problem Relation Age of Onset   Diabetes Mother    Heart disease Mother    Hyperlipidemia Mother    Diabetes Father    Diabetes Sister    Hypertension Sister    Heart disease Sister    Dementia Sister    Heart disease Brother    Healthy Daughter    Healthy Daughter    Healthy Daughter    Healthy Son     Social History:  reports that she quit smoking about 31 years ago. Her smoking use included cigarettes. She has never been exposed to tobacco smoke. She has never used smokeless tobacco. She reports that she does not currently use alcohol. She reports that she does not use drugs.    Review of Systems   HYPERTENSION: She is on amlodipine 5 mg once daily and Diovan to 20 mg  Was tried on diltiazem instead of amlodipine before but she felt that this was causing hair loss and constipation Also HCTZ had caused hyponatremia  She is checking blood pressure at home periodically  BP Readings from Last 3 Encounters:  03/08/22 138/70  02/20/22 (!) 152/71  01/02/22 (!) 145/76    On Lasix 40-80 mg for edema  NEPHROPATHY: Last microalbumin back to normal  LIPIDS:  She is taking rosuvastatin 40 mg, previously LDL was high with simvastatin 20 mg LDL below 100 Crestor prescribed by PCP      Lab Results  Component Value Date   CHOL 133 08/24/2019   HDL 59.70 08/24/2019   LDLCALC 61 08/24/2019   LDLDIRECT 62.0 05/18/2021   TRIG 64.0 08/24/2019   CHOLHDL 2 08/24/2019                  Thyroid:      She has been hypothyroid for  over 40 years  The dose has been adjusted periodically She takes levothyroxine before breakfast daily   TSH has been consistent within the normal range with taking 112 mcg  Last labs:   Lab Results  Component Value Date   TSH 1.96 10/29/2021   TSH 0.75 09/27/2021   TSH 3.85 05/18/2021   FREET4 1.12 10/29/2021   FREET4 1.01 01/18/2021   FREET4 1.58 08/24/2019    She is followed periodically by podiatrist  Physical Examination:  BP 138/70   Pulse 82   Wt 210 lb (95.3 kg)   SpO2 98%  BMI 34.95 kg/m   No edema  She has firm subcutaneous nodules in the right upper arm  ASSESSMENT/PLAN:  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:  See history of present illness for detailed discussion of current diabetes management, blood sugar patterns and problems identified  She is currently on basal insulin, Farxiga 5 mg and metformin  A1c is fairly good at 6.6  Blood sugars on her sensor download recently are 97% within target range and averaging only 110 Only rarely will have high postprandial readings if she is drinking sweet tea or getting more carbs Weight has leveled off As above may have low normal readings overnight  Recommendations:   She will reduce Tresiba insulin back to 32 No change in [place] 5 mg daily but may consider increasing the dose if she does not have any further yeast infection Discussed prevention of vaginal candidiasis with hygienic measures and increasing fluids and avoiding regular soft drinks  May occasionally use NovoLog if eating very much carbohydrate  HYPERTENSION with microalbuminuria: Blood pressure is controlled She will continue the same regimen Will recheck microalbumin on the next visit, on the last measurement was normal  Hypothyroidism: To have labs checked on the next visit   There are no Patient Instructions on file for this visit.   Elayne Snare 03/08/2022, 10:30 AM   Note: This office note was prepared with Dragon voice recognition  system technology. Any transcriptional errors that result from this process are unintentional.

## 2022-03-11 ENCOUNTER — Other Ambulatory Visit: Payer: Self-pay

## 2022-03-11 DIAGNOSIS — E119 Type 2 diabetes mellitus without complications: Secondary | ICD-10-CM | POA: Diagnosis not present

## 2022-03-11 DIAGNOSIS — E1165 Type 2 diabetes mellitus with hyperglycemia: Secondary | ICD-10-CM

## 2022-03-11 MED ORDER — DEXCOM G6 SENSOR MISC: 3 refills | Status: DC

## 2022-03-12 ENCOUNTER — Telehealth: Payer: Self-pay | Admitting: Nutrition

## 2022-03-12 ENCOUNTER — Encounter: Payer: Self-pay | Admitting: Endocrinology

## 2022-03-12 DIAGNOSIS — E1165 Type 2 diabetes mellitus with hyperglycemia: Secondary | ICD-10-CM

## 2022-03-12 NOTE — Telephone Encounter (Signed)
Patient reports that she has not been able to talk with anyone at [facility], and has been out of sensors for 3 weeks.   She was told to call her medicare to see what other dme supplier they use and call us back to send the script.  Also, she was told to call them to set up an account with them to hurry things along.  She agreed to do this

## 2022-03-12 NOTE — Telephone Encounter (Signed)
Patient reports that she has not been able to get in touch with [facility] dme suppier for 3 weeks.  She has called her insurance and they said to send the scripts for her Dexcom sensors and transmitter to [facility] care.  FAx Number: [phone]

## 2022-03-13 MED ORDER — DEXCOM G6 SENSOR MISC: 3 refills | Status: DC

## 2022-03-13 MED ORDER — DEXCOM G6 TRANSMITTER MISC: 3 refills | Status: DC

## 2022-03-13 NOTE — Telephone Encounter (Signed)
Rx sent 

## 2022-03-15 DIAGNOSIS — M5451 Vertebrogenic low back pain: Secondary | ICD-10-CM | POA: Diagnosis not present

## 2022-03-21 DIAGNOSIS — M4856XA Collapsed vertebra, not elsewhere classified, lumbar region, initial encounter for fracture: Secondary | ICD-10-CM | POA: Diagnosis not present

## 2022-03-21 NOTE — Progress Notes (Deleted)
Office Visit Note  Patient: Marissa Roberts             Date of Birth: 04-Dec-1945           MRN: [id number]             PCP: [name], MD Referring: [name], * Visit Date: 04/03/2022 Occupation: '@GUAROCC'$ @  Subjective:  No chief complaint on file.   History of Present Illness: [place] is a 76 y.o. female ***     Activities of Daily Living:  Patient reports morning stiffness for *** {minute/hour:19697}.   Patient {ACTIONS;DENIES/REPORTS:21021675::"Denies"} nocturnal pain.  Difficulty dressing/grooming: {ACTIONS;DENIES/REPORTS:21021675::"Denies"} Difficulty climbing stairs: {ACTIONS;DENIES/REPORTS:21021675::"Denies"} Difficulty getting out of chair: {ACTIONS;DENIES/REPORTS:21021675::"Denies"} Difficulty using hands for taps, buttons, cutlery, and/or writing: {ACTIONS;DENIES/REPORTS:21021675::"Denies"}  No Rheumatology ROS completed.   PMFS History:  Patient Active Problem List   Diagnosis Date Noted   Generalized subcutaneous nodules 11/22/2021   Left leg pain 04/26/2021   History of laparoscopic-assisted vaginal hysterectomy 04/23/2021   Vaginal yeast infection 03/30/2021   Displaced transverse fracture of left patella, subsequent encounter for closed fracture with delayed healing    Arthritis of knee 05/23/2020   Pain in knee region after total knee replacement ([place]) 03/22/2020   Arthritis of left knee 07/01/2019   Hypercholesterolemia 04/20/2019   Arthritis 04/20/2019   B12 deficiency 03/23/2019   Anemia 02/05/2019   Rheumatoid arthritis with rheumatoid factor of multiple sites without organ or systems involvement ([place]) 01/25/2019   High risk medication use 01/25/2019   Fatigue 09/18/2018   Encounter for general adult medical examination with abnormal findings 02/07/2015   Morbid obesity ([place]) 04/19/2013   Hyperlipidemia associated with type 2 diabetes mellitus ([place]) 07/28/2009   Osteopenia 03/14/2009   Diabetes mellitus type  2 with complications ([place]) 58/12/9831   Hypothyroidism 09/24/2006   Essential hypertension 09/24/2006    Past Medical History:  Diagnosis Date   Anemia    Arthritis    Bronchitis    hx of   Diabetes mellitus    type 2   Family history of adverse reaction to anesthesia    per patient, "daughter threw up after a knee surgery"   GERD (gastroesophageal reflux disease)    Hypercholesteremia    Hypertension    Hypothyroidism    Pneumonia    hx of   PONV (postoperative nausea and vomiting)    Thyroid disease     Family History  Problem Relation Age of Onset   Diabetes Mother    Heart disease Mother    Hyperlipidemia Mother    Diabetes Father    Diabetes Sister    Hypertension Sister    Heart disease Sister    Dementia Sister    Heart disease Brother    Healthy Daughter    Healthy Daughter    Healthy Daughter    Healthy Son    Past Surgical History:  Procedure Laterality Date   ABDOMINAL HYSTERECTOMY     partial   BREAST SURGERY     lumpectomy left breast   carpel tunnel     COLONOSCOPY  07/2020   COLONOSCOPY W/ POLYPECTOMY     FOOT SURGERY     knee arhtroscopy     KNEE ARTHROPLASTY Left 05/2020   ORIF PATELLA Left 05/23/2020   Procedure: LEFT KNEE POLE PATELLA EXCISION;  Surgeon: [name], MD;  Location: [facility];  Service: Orthopedics;  Laterality: Left;   SHOULDER ARTHROSCOPY WITH SUBACROMIAL DECOMPRESSION, ROTATOR CUFF REPAIR AND BICEP TENDON  REPAIR Right 05/26/2012   Procedure: Right Shoulder Diagnostic Operative Arthroscopy, Subacromial Decompression, Biceps Tenodesis, Mini Open Rotator Cuff Repair;  Surgeon: [name], MD;  Location: [facility];  Service: Orthopedics;  Laterality: Right;  Right Shoulder Diagnostic Operative Arthroscopy, Subacromial Decompression, Biceps Tenodesis, Mini Open Rotator Cuff Repair   TONSILLECTOMY     TOTAL KNEE ARTHROPLASTY Left 07/01/2019   Procedure: LEFT TOTAL KNEE ARTHROPLASTY;  Surgeon: [name], MD;   Location: [facility];  Service: Orthopedics;  Laterality: Left;   UPPER GI ENDOSCOPY  07/2020   Social History   Social History Narrative   Not on file   Immunization History  Administered Date(s) Administered   COVID-19, mRNA, vaccine(Comirnaty)12 years and older 01/08/2022   Fluad Quad(high Dose 65+) 01/02/2019, 12/31/2019   Influenza Split 01/03/2011   Influenza Whole 04/01/2005, 12/18/2009   Influenza, High Dose Seasonal PF 01/07/2014, 01/27/2015, 01/02/2016, 01/13/2017, 01/16/2018   Influenza-Unspecified 11/30/2012, 01/03/2021, 01/08/2022   PFIZER(Purple Top)SARS-COV-2 Vaccination 05/28/2019, 06/23/2019, 01/15/2020, 07/03/2020, 01/03/2021   Pneumococcal Conjugate-13 09/12/2016, 01/02/2019   Pneumococcal Polysaccharide-23 07/15/2014, 12/27/2019   Respiratory Syncytial Virus Vaccine,Recomb Aduvanted(Arexvy) 01/08/2022   Td 04/01/2001, 10/07/2013   Zoster Recombinat (Shingrix) 01/09/2021, 03/12/2021     Objective: Vital Signs: There were no vitals taken for this visit.   Physical Exam   Musculoskeletal Exam: ***  CDAI Exam: CDAI Score: -- Patient Global: --; Provider Global: -- Swollen: --; Tender: -- Joint Exam 04/03/2022   No joint exam has been documented for this visit   There is currently no information documented on the homunculus. Go to the Rheumatology activity and complete the homunculus joint exam.  Investigation: No additional findings.  Imaging: DG Chest 2 View  Result Date: 02/20/2022 CLINICAL DATA:  Provided history: Lower rib pain. Shortness of breath. Additional history provided: Patient reports pain with deep breaths. EXAM: CHEST - 2 VIEW COMPARISON:  Prior chest radiographs 05/25/2017 and earlier. Lumbar spine MRI 06/30/2021. FINDINGS: Heart size within normal limits. Aortic atherosclerosis. No appreciable airspace consolidation. No evidence of pleural effusion or pneumothorax. Vertebral compression fracture with mild height loss at the thoracolumbar  junction (tentatively the L1 level), new from the prior lumbar spine MRI of 06/30/2021 but otherwise age indeterminate. Correlate for point tenderness at this site. Degenerative changes of the spine. These results will be called to the ordering clinician or representative by the Radiologist Assistant, and communication documented in the PACS or [employer]. IMPRESSION: No evidence of acute cardiopulmonary abnormality. Vertebral compression fracture with mild height loss at the thoracolumbar junction (tentatively the L1 level), new from the prior lumbar spine MRI of 06/30/2021 but otherwise age indeterminate. Correlate for point tenderness at this site. Aortic Atherosclerosis (ICD10-I70.0). Electronically Signed   By: Kellie Simmering D.O.   On: 02/20/2022 16:31    Recent Labs: Lab Results  Component Value Date   WBC 8.8 02/20/2022   HGB 12.0 02/20/2022   PLT 294 02/20/2022   NA 137 03/05/2022   K 4.0 03/05/2022   CL 99 03/05/2022   CO2 28 03/05/2022   GLUCOSE 97 03/05/2022   BUN 17 03/05/2022   CREATININE 0.73 03/05/2022   BILITOT 0.2 02/20/2022   ALKPHOS 45 07/30/2021   AST 14 02/20/2022   ALT 12 02/20/2022   PROT 7.0 02/20/2022   ALBUMIN 4.0 07/30/2021   CALCIUM 10.3 03/05/2022   GFRAA 101 09/18/2020   QFTBGOLDPLUS NEGATIVE 01/09/2021    Speciality Comments: PLQ Eye Exam: 12/22/2019 WNL @ [facility] follow up in 6 months  Methotrexate-hair loss  Procedures:  No procedures performed Allergies: Contrast media [iodinated contrast media], Adhesive [tape], Erythromycin, and Penicillins   Assessment / Plan:     Visit Diagnoses: Rheumatoid arthritis involving multiple sites with positive rheumatoid factor (HCC)  High risk medication use  Trigger finger, left middle finger  Primary osteoarthritis of both hands  Trochanteric bursitis, left hip  S/P total knee replacement, left  Primary osteoarthritis of both feet  Demyelinating neuropathy  History of  osteopenia  Essential hypertension  Diabetes mellitus type 2 with complications ([place])  Hyperlipidemia associated with type 2 diabetes mellitus ([place])  History of hypothyroidism  Orders: No orders of the defined types were placed in this encounter.  No orders of the defined types were placed in this encounter.   Face-to-face time spent with patient was *** minutes. Greater than 50% of time was spent in counseling and coordination of care.  Follow-Up Instructions: No follow-ups on file.   Ofilia Neas, PA-C  Note - This record has been created using Dragon software.  Chart creation errors have been sought, but may not always  have been located. Such creation errors do not reflect on  the standard of medical care.

## 2022-03-22 DIAGNOSIS — S32010A Wedge compression fracture of first lumbar vertebra, initial encounter for closed fracture: Secondary | ICD-10-CM | POA: Diagnosis not present

## 2022-03-28 ENCOUNTER — Ambulatory Visit: Payer: Medicare Other | Admitting: Internal Medicine

## 2022-03-29 ENCOUNTER — Ambulatory Visit (INDEPENDENT_AMBULATORY_CARE_PROVIDER_SITE_OTHER): Payer: Medicare Other | Admitting: Internal Medicine

## 2022-03-29 ENCOUNTER — Telehealth: Payer: Self-pay | Admitting: Internal Medicine

## 2022-03-29 ENCOUNTER — Encounter: Payer: Self-pay | Admitting: Internal Medicine

## 2022-03-29 VITALS — BP 142/80 | HR 95 | Temp 98.1°F | Ht 65.0 in | Wt 201.0 lb

## 2022-03-29 DIAGNOSIS — M0579 Rheumatoid arthritis with rheumatoid factor of multiple sites without organ or systems involvement: Secondary | ICD-10-CM

## 2022-03-29 DIAGNOSIS — I10 Essential (primary) hypertension: Secondary | ICD-10-CM

## 2022-03-29 DIAGNOSIS — E118 Type 2 diabetes mellitus with unspecified complications: Secondary | ICD-10-CM | POA: Diagnosis not present

## 2022-03-29 DIAGNOSIS — Z0001 Encounter for general adult medical examination with abnormal findings: Secondary | ICD-10-CM

## 2022-03-29 DIAGNOSIS — E785 Hyperlipidemia, unspecified: Secondary | ICD-10-CM

## 2022-03-29 DIAGNOSIS — E039 Hypothyroidism, unspecified: Secondary | ICD-10-CM | POA: Diagnosis not present

## 2022-03-29 DIAGNOSIS — S32000A Wedge compression fracture of unspecified lumbar vertebra, initial encounter for closed fracture: Secondary | ICD-10-CM | POA: Insufficient documentation

## 2022-03-29 DIAGNOSIS — E1169 Type 2 diabetes mellitus with other specified complication: Secondary | ICD-10-CM

## 2022-03-29 MED ORDER — CALCITONIN (SALMON) 200 UNIT/ACT NA SOLN: 1.0000 | Freq: Every day | NASAL | 2 refills | Status: DC

## 2022-03-29 MED ORDER — OXYCODONE HCL 5 MG PO TABS: 5.0000 mg | ORAL_TABLET | Freq: Three times a day (TID) | ORAL | 0 refills | Status: DC | PRN

## 2022-03-29 NOTE — Telephone Encounter (Signed)
Patient called back and said that she was unable to reach the back doctor so she will need the rx that was discussed this morning during visit sent in.  Pharmacy:  [facility] [address]

## 2022-03-29 NOTE — Assessment & Plan Note (Signed)
Recent labs with endo at goal and up to date. Will continue regimen per endo.

## 2022-03-29 NOTE — Patient Instructions (Addendum)
We have sent in a nose spray to do 1 spray daily alternate nostrils each day.  Take this for 2-3 months to help heal the back.   Call dr. Nelva Bush and see if he will refill the pain medicine. If you cannot get in touch with him today call us we will fill it.

## 2022-03-29 NOTE — Assessment & Plan Note (Addendum)
Recent lipid panel at goal. Continue crestor 40 mg daily.

## 2022-03-29 NOTE — Assessment & Plan Note (Signed)
BP mildly elevated due to pain today from back. Continue amlodipine 10 mg daily and lasix 80 mg daily and valsartan 320 mg daily. Recent BMP at goal without changes.

## 2022-03-29 NOTE — Assessment & Plan Note (Signed)
With significant pain. Taking arava and no flare today.

## 2022-03-29 NOTE — Assessment & Plan Note (Signed)
Recent labs at goal so continue levothyroxine 112 mcg daily.

## 2022-03-29 NOTE — Assessment & Plan Note (Signed)
Flu shot up to date. Pneumonia complete. Shingrix complete. Tetanus due 2025. Colonoscopy due 2025. Mammogram due 2024, pap smear aged out and dexa due 2025. Counseled about sun safety and mole surveillance. Counseled about the dangers of distracted driving. Given 10 year screening recommendations.

## 2022-03-29 NOTE — Telephone Encounter (Signed)
Rx done. 

## 2022-03-29 NOTE — Assessment & Plan Note (Signed)
New problem to me although she was seen at urgent care/er and then Dr. Nelva Bush. This happened around Thanksgiving so she is at least 4 weeks out. She is not taking calcitonin nasal spray which is prescribed today to use for 3 months. She has been given oxycodone from Dr. Nelva Bush and he is getting her in with another provider at their office (presumably for possible kyphoplasty although they were unsure) which is not until early/mid January. Discussed with them that procedure closer to fracture and within 6-8 weeks is imperative for increasing odds of success in reduction of pain. We discussed that the fracture typically heals in 6-8 weeks and pain can either be short term or long term depending on loss of height and healing. MRI records not available for my review during visit. She is out of pain medicine and will contact Dr. Nelva Bush. If he is unavailable and she is unable to obtain pain medication I will provide 5 day supply oxycodone until she can get in touch with them next week. She understands that typically one doctor treats the pain and not multiple providers.

## 2022-03-29 NOTE — Progress Notes (Signed)
   Subjective:   Patient ID: Marissa Roberts, female    DOB: 05/31/1945, 76 y.o.   MRN: [id number]  HPI The patient is here for physical with new acute concern.  PMH, [facility], social history reviewed and updated  Review of Systems  Constitutional:  Positive for activity change. Negative for appetite change, chills, fatigue, fever and unexpected weight change.  Respiratory: Negative.    Cardiovascular: Negative.   Gastrointestinal: Negative.   Musculoskeletal:  Positive for arthralgias, back pain and myalgias. Negative for gait problem and joint swelling.  Skin: Negative.   Neurological: Negative.     Objective:  Physical Exam Constitutional:      Appearance: She is well-developed.  HENT:     Head: Normocephalic and atraumatic.  Cardiovascular:     Rate and Rhythm: Normal rate and regular rhythm.  Pulmonary:     Effort: Pulmonary effort is normal. No respiratory distress.     Breath sounds: Normal breath sounds. No wheezing or rales.  Abdominal:     General: Bowel sounds are normal. There is no distension.     Palpations: Abdomen is soft.     Tenderness: There is no abdominal tenderness. There is no rebound.  Musculoskeletal:        General: Tenderness present.     Cervical back: Normal range of motion.  Skin:    General: Skin is warm and dry.  Neurological:     Mental Status: She is alert and oriented to person, place, and time.     Coordination: Coordination abnormal.     Comments: Walker for ambulation     Vitals:   03/29/22 0923 03/29/22 0930  BP: (!) 142/80 (!) 142/80  Pulse: 95   Temp: 98.1 F (36.7 C)   TempSrc: Oral   SpO2: 99%   Weight: 201 lb (91.2 kg)   Height: '5\' 5"'$  (1.651 m)     Assessment & Plan:

## 2022-03-29 NOTE — Telephone Encounter (Signed)
Spoke with patient and let her know about prescription.

## 2022-04-02 ENCOUNTER — Other Ambulatory Visit: Payer: Self-pay | Admitting: Endocrinology

## 2022-04-02 DIAGNOSIS — E063 Autoimmune thyroiditis: Secondary | ICD-10-CM

## 2022-04-03 ENCOUNTER — Telehealth: Payer: Self-pay | Admitting: Endocrinology

## 2022-04-03 ENCOUNTER — Ambulatory Visit (HOSPITAL_COMMUNITY): Payer: Self-pay | Admitting: Orthopedic Surgery

## 2022-04-03 ENCOUNTER — Ambulatory Visit: Payer: Medicare Other | Admitting: Physician Assistant

## 2022-04-03 ENCOUNTER — Telehealth: Payer: Self-pay | Admitting: Internal Medicine

## 2022-04-03 DIAGNOSIS — E118 Type 2 diabetes mellitus with unspecified complications: Secondary | ICD-10-CM

## 2022-04-03 DIAGNOSIS — M0579 Rheumatoid arthritis with rheumatoid factor of multiple sites without organ or systems involvement: Secondary | ICD-10-CM

## 2022-04-03 DIAGNOSIS — M7062 Trochanteric bursitis, left hip: Secondary | ICD-10-CM

## 2022-04-03 DIAGNOSIS — M19071 Primary osteoarthritis, right ankle and foot: Secondary | ICD-10-CM

## 2022-04-03 DIAGNOSIS — Z79899 Other long term (current) drug therapy: Secondary | ICD-10-CM

## 2022-04-03 DIAGNOSIS — Z8639 Personal history of other endocrine, nutritional and metabolic disease: Secondary | ICD-10-CM

## 2022-04-03 DIAGNOSIS — I10 Essential (primary) hypertension: Secondary | ICD-10-CM

## 2022-04-03 DIAGNOSIS — Z96652 Presence of left artificial knee joint: Secondary | ICD-10-CM

## 2022-04-03 DIAGNOSIS — M5451 Vertebrogenic low back pain: Secondary | ICD-10-CM | POA: Diagnosis not present

## 2022-04-03 DIAGNOSIS — M19041 Primary osteoarthritis, right hand: Secondary | ICD-10-CM

## 2022-04-03 DIAGNOSIS — Z8739 Personal history of other diseases of the musculoskeletal system and connective tissue: Secondary | ICD-10-CM

## 2022-04-03 DIAGNOSIS — G629 Polyneuropathy, unspecified: Secondary | ICD-10-CM

## 2022-04-03 DIAGNOSIS — E1169 Type 2 diabetes mellitus with other specified complication: Secondary | ICD-10-CM

## 2022-04-03 DIAGNOSIS — M65332 Trigger finger, left middle finger: Secondary | ICD-10-CM

## 2022-04-03 NOTE — Telephone Encounter (Signed)
Patient's daughter,Stephanie, brought by presurgical form that needs to be completed.  The form is in Dr. Ronnie Derby folder in front office.

## 2022-04-03 NOTE — Telephone Encounter (Signed)
PT's daughter visits today with an [facility] surgical clearance form to be filled out by [facility] and faxed out. Form has been left in Dr.Crawford's mailbox.  FAX: [phone]  PT's surgery is Friday so they are wanting to see if this can be done before then. I did inform PT of the usual turn around time on paperwork.

## 2022-04-04 ENCOUNTER — Encounter (HOSPITAL_COMMUNITY): Payer: Self-pay | Admitting: Orthopedic Surgery

## 2022-04-04 NOTE — Telephone Encounter (Signed)
Faxed back.

## 2022-04-04 NOTE — Telephone Encounter (Signed)
Completed.

## 2022-04-04 NOTE — Progress Notes (Signed)
PCP/Internal Med - Dr Pricilla Holm Cardiologist - n/a Endocrinology - Dr Elayne Snare Neurology - Dr Creig Hines Rheumatology - [name], PA-C  Chest x-ray - 02/20/22 (2V) EKG - 02/20/22 Stress Test - Yes, but patient unaware of when or where it took place but > 10 yrs ago.  ECHO - n/a Cardiac Cath - n/a  ICD Pacemaker/Loop - none  Sleep Study -  n/a CPAP - none  Do not take Metformin or Farxiga on the morning of surgery.  Hold Farxiga 72 hours prior procedure.  Last dose of Wilder Glade was on    THE MORNING OF SURGERY, take 18 units of Tresiba Insulin.  If your blood sugar is less than 70 mg/dL, you will need to treat for low blood sugar: Treat a low blood sugar (less than 70 mg/dL) with  cup of clear juice (cranberry or apple), 4 glucose tablets, OR glucose gel. Recheck blood sugar in 15 minutes after treatment (to make sure it is greater than 70 mg/dL). If your blood sugar is not greater than 70 mg/dL on recheck, call [phone] for further instructions.  ERAS: Clear liquids til 12:30 PM DOS  Anesthesia review: Yes  STOP now taking any Aspirin (unless otherwise instructed by your surgeon), Aleve, Naproxen, Ibuprofen, Motrin, Advil, Goody's, BC's, all herbal medications, fish oil, and all vitamins.   Coronavirus Screening Do you have any of the following symptoms:  Cough yes/no: No Fever (>100.43F)  yes/no: No Runny nose yes/no: No Sore throat yes/no: No Difficulty breathing/shortness of breath  yes/no: No  Have you traveled in the last 14 days and where? yes/no: No  Patient verbalized understanding of instructions that were given via phone.

## 2022-04-04 NOTE — Telephone Encounter (Signed)
This has been placed in Dr Sharlet Salina in office box

## 2022-04-05 ENCOUNTER — Encounter (HOSPITAL_COMMUNITY): Payer: Self-pay | Admitting: Orthopedic Surgery

## 2022-04-05 ENCOUNTER — Other Ambulatory Visit: Payer: Self-pay

## 2022-04-05 NOTE — Anesthesia Preprocedure Evaluation (Signed)
Anesthesia Evaluation  Patient identified by MRN, date of birth, ID band Patient awake    Reviewed: Allergy & Precautions, NPO status , Patient's Chart, lab work & pertinent test results  History of Anesthesia Complications (+) PONV, Family history of anesthesia reaction and history of anesthetic complications  Airway Mallampati: II  TM Distance: >3 FB Neck ROM: Full    Dental  (+) Dental Advisory Given   Pulmonary pneumonia, former smoker   Pulmonary exam normal breath sounds clear to auscultation       Cardiovascular hypertension, Pt. on medications Normal cardiovascular exam Rhythm:Regular Rate:Normal     Neuro/Psych negative neurological ROS     GI/Hepatic Neg liver ROS,GERD  ,,  Endo/Other  diabetes, Type 2Hypothyroidism    Renal/GU negative Renal ROS     Musculoskeletal  (+) Arthritis ,    Abdominal  (+) + obese  Peds  Hematology  (+) Blood dyscrasia, anemia   Anesthesia Other Findings   Reproductive/Obstetrics                             Anesthesia Physical Anesthesia Plan  ASA: 3  Anesthesia Plan: MAC   Post-op Pain Management:  Regional for Post-op pain and Tylenol PO (pre-op)*   Induction: Intravenous  PONV Risk Score and Plan: Dexamethasone, Ondansetron and Treatment may vary due to age or medical condition  Airway Management Planned: Natural Airway  Additional Equipment: None  Intra-op Plan:   Post-operative Plan:   Informed Consent: I have reviewed the patients History and Physical, chart, labs and discussed the procedure including the risks, benefits and alternatives for the proposed anesthesia with the patient or authorized representative who has indicated his/her understanding and acceptance.     Dental advisory given  Plan Discussed with: CRNA  Anesthesia Plan Comments: (PAT note written 04/05/2022 by Shonna Chock, PA-C.  )        Anesthesia  Quick Evaluation

## 2022-04-05 NOTE — Progress Notes (Signed)
PCP - Pricilla Holm, MD Cardiologist - denies  PPM/ICD - denies  Chest x-ray - 02/20/22 EKG - 02/20/22  CPAP - n/a  Fasting Blood Sugar - 98 - 100 Checks Blood Sugar - 3 - 4 /day  Blood Thinner Instructions: n/a Aspirin Instructions: Patient was instructed: As of today, STOP taking any Aspirin (unless otherwise instructed by your surgeon) Aleve, Naproxen, Ibuprofen, Motrin, Advil, Goody's, BC's, all herbal medications, fish oil, and all vitamins.  ERAS Protcol - yes, until 12:30 o'clock  COVID TEST- n/a  Anesthesia review: yes  Patient verbally denies any shortness of breath, fever, cough and chest pain during phone call   -------------  SDW INSTRUCTIONS given:  Your procedure is scheduled on Monday, January 8th, 2024.  Report to [facility] Main Entrance "A" at 13:00 A.M., and check in at the Admitting office.  Call this number if you have problems the morning of surgery:  [phone]   Remember:  Do not eat after midnight the night before your surgery  You may drink clear liquids until 12:30 the morning of your surgery.   Clear liquids allowed are: Water, Non-Citrus Juices (without pulp), Carbonated Beverages, Clear Tea, Black Coffee Only, and Gatorade    Take these medicines the morning of surgery with A SIP OF WATER: Amlodipine, Synthroid, Crestor, Prilosec, eye drops PRN: Flexeril, Oxycodone, Flonase   Do not take Metformin the morning of surgery.  Hold Farxiga 72 hrs prior to surgery   THE MORNING OF SURGERY, take 17 units of Tresiba insulin (50% of your regular dose)  Hold Novolog the morning of surgery  If your CBG is greater than 220 mg/dL, you may take  of your sliding scale (correction) dose of insulin.   How do I manage my blood sugar before surgery? Check your blood sugar at least 4 times a day, starting 2 days before surgery, to make sure that the level is not too high or low.  Check your blood sugar the morning of your surgery when you  wake up and every 2 hours until you get to the Short Stay unit.  If your blood sugar is less than 70 mg/dL, you will need to treat for low blood sugar: Do not take insulin. Treat a low blood sugar (less than 70 mg/dL) with  cup of clear juice (cranberry or apple), 4 glucose tablets, OR glucose gel. Recheck blood sugar in 15 minutes after treatment (to make sure it is greater than 70 mg/dL). If your blood sugar is not greater than 70 mg/dL on recheck, call [phone] for further instructions. Report your blood sugar to the short stay nurse when you get to Short Stay.   The day of surgery:                     Do not wear jewelry, make up, or nail polish            Do not wear lotions, powders, perfumes, or deodorant.            Do not shave 48 hours prior to surgery.              Do not bring valuables to the hospital.            [facility] is not responsible for any belongings or valuables.  Do NOT Smoke (Tobacco/Vaping) 24 hours prior to your procedure If you use a CPAP at night, you may bring all equipment for your overnight stay.   Contacts, glasses,  dentures or bridgework may not be worn into surgery.      For patients admitted to the hospital, discharge time will be determined by your treatment team.   Patients discharged the day of surgery will not be allowed to drive home, and someone needs to stay with them for 24 hours.    Special instructions:   [place]- Preparing For Surgery  Before surgery, you can play an important role. Because skin is not sterile, your skin needs to be as free of germs as possible. You can reduce the number of germs on your skin by washing with CHG (chlorahexidine gluconate) Soap before surgery.  CHG is an antiseptic cleaner which kills germs and bonds with the skin to continue killing germs even after washing.    Oral Hygiene is also important to reduce your risk of infection.  Remember - BRUSH YOUR TEETH THE MORNING OF SURGERY WITH YOUR REGULAR  TOOTHPASTE  Please do not use if you have an allergy to CHG or antibacterial soaps. If your skin becomes reddened/irritated stop using the CHG.  Do not shave (including legs and underarms) for at least 48 hours prior to first CHG shower. It is OK to shave your face.  Please follow these instructions carefully.   Shower the NIGHT BEFORE SURGERY and the MORNING OF SURGERY with DIAL Soap.   Pat yourself dry with a CLEAN TOWEL.  Wear CLEAN PAJAMAS to bed the night before surgery  Place CLEAN SHEETS on your bed the night of your first shower and DO NOT SLEEP WITH PETS.   Day of Surgery: Please shower morning of surgery  Wear Clean/Comfortable clothing the morning of surgery Do not apply any deodorants/lotions.   Remember to brush your teeth WITH YOUR REGULAR TOOTHPASTE.   Questions were answered. Patient verbalized understanding of instructions.

## 2022-04-05 NOTE — Progress Notes (Signed)
Anesthesia Chart Review: Marissa Roberts  Case: [id number] Date/Time: 04/08/22 1513   Procedure: KYPHOPLASTY L1 - Local with IV Regional   Anesthesia type: Regional   Pre-op diagnosis: L1 osteoportic compression fracture   Location: [facility] OR ROOM 04 / [place] OR   Surgeons: Melina Schools, MD       DISCUSSION: Patient is a 77 year old female scheduled for the above procedure.  History includes former smoker (quit 04/01/90), post-operative N/V, HTN, hypothyroidism, GERD, hypercholesterolemia, anemia, DM2, RA, TKA (left 07/01/19). 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    A1c 6.6% on 03/05/22. She has a Dexcom G6 continuous glucose monitor. She is on Farxiga, metformin, Novolog 10-15 units TID, Tresiba 34 units Q AM. PAT RN to contact her to review pre-operative DM guidelines.   She is a same day work-up. Anesthesia team to evaluate on the day of surgery.   VS:  BP Readings from Last 3 Encounters:  03/29/22 (!) 142/80  03/08/22 138/70  02/20/22 (!) 152/71   Pulse Readings from Last 3 Encounters:  03/29/22 95  03/08/22 82  02/20/22 80     PROVIDERS: [name], MD is PCP. Notes in [facility] suggest Dr. Sharlet Salina completed surgical clearance form from Dr. Rolena Infante on 04/03/22.  Elayne Snare, MD is endocrinologist Bo Merino, MD is rheumatologist Creig Hines, MD is neurologist ([facility])   LABS: For day of procedure as indicated. Last lab results in [facility] include: Lab Results  Component Value Date   WBC 8.8 02/20/2022   HGB 12.0 02/20/2022   HCT 37.1 02/20/2022   PLT 294 02/20/2022   GLUCOSE 97 03/05/2022   ALT 12 02/20/2022   AST 14 02/20/2022   NA 137 03/05/2022   K 4.0 03/05/2022   CL 99 03/05/2022   CREATININE 0.73 03/05/2022   BUN 17 03/05/2022   CO2 28 03/05/2022   TSH 1.96 10/29/2021   HGBA1C 6.6 (H) 03/05/2022   MICROALBUR 9.7 (H) 05/30/2021    IMAGES: CXR  02/20/22: MPRESSION: - No evidence of acute cardiopulmonary abnormality. - Vertebral compression fracture with mild height loss at the thoracolumbar junction (tentatively the L1 level), new from the prior lumbar spine MRI of 06/30/2021 but otherwise age indeterminate. Correlate for point tenderness at this site. - Aortic Atherosclerosis (ICD10-I70.0).    EKG: 02/20/22: Sinus rhythm with 1st degree A-V block Inferior infarct , age undetermined Anterior infarct , age undetermined Abnormal ECG When compared with ECG of 22-Jun-2019 14:07, PREVIOUS ECG IS PRESENT No significant change since last tracing Confirmed by Jenkins Rouge [phone]) on 02/21/2022 10:52:42 PM   CV: Reported prior stress test > 10 years ago.    Past Medical History:  Diagnosis Date   Anemia    Arthritis    Bronchitis    hx of   Diabetes mellitus    type 2   Family history of adverse reaction to anesthesia    per patient, "daughter threw up after a knee surgery"   GERD (gastroesophageal reflux disease)    Hypercholesteremia    Hypertension    Hypothyroidism    Pneumonia    hx of   PONV (postoperative nausea and vomiting)    Thyroid disease     Past Surgical History:  Procedure Laterality Date   ABDOMINAL HYSTERECTOMY     partial   BREAST SURGERY     lumpectomy left breast   carpel tunnel     COLONOSCOPY  07/2020  COLONOSCOPY W/ POLYPECTOMY     FOOT SURGERY     knee arhtroscopy     KNEE ARTHROPLASTY Left 05/2020   ORIF PATELLA Left 05/23/2020   Procedure: LEFT KNEE POLE PATELLA EXCISION;  Surgeon: [name], MD;  Location: [place];  Service: Orthopedics;  Laterality: Left;   SHOULDER ARTHROSCOPY WITH SUBACROMIAL DECOMPRESSION, ROTATOR CUFF REPAIR AND BICEP TENDON REPAIR Right 05/26/2012   Procedure: Right Shoulder Diagnostic Operative Arthroscopy, Subacromial Decompression, Biceps Tenodesis, Mini Open Rotator Cuff Repair;  Surgeon: [name], MD;  Location: [place];  Service:  Orthopedics;  Laterality: Right;  Right Shoulder Diagnostic Operative Arthroscopy, Subacromial Decompression, Biceps Tenodesis, Mini Open Rotator Cuff Repair   TONSILLECTOMY     TOTAL KNEE ARTHROPLASTY Left 07/01/2019   Procedure: LEFT TOTAL KNEE ARTHROPLASTY;  Surgeon: [name], MD;  Location: [place];  Service: Orthopedics;  Laterality: Left;   UPPER GI ENDOSCOPY  07/2020    MEDICATIONS: No current facility-administered medications for this encounter.    amLODipine (NORVASC) 10 MG tablet   Biotin 10000 MCG TABS   calcitonin, salmon, (MIACALCIN) 200 UNIT/ACT nasal spray   Calcium Carb-Cholecalciferol (CALCIUM 600 + D PO)   clotrimazole (LOTRIMIN) 1 % cream   cyclobenzaprine (FLEXERIL) 10 MG tablet   diazepam (VALIUM) 2 MG tablet   diclofenac Sodium (VOLTAREN) 1 % GEL   doxycycline (VIBRAMYCIN) 100 MG capsule   FARXIGA 10 MG TABS tablet   fluticasone (FLONASE) 50 MCG/ACT nasal spray   furosemide (LASIX) 40 MG tablet   Glucosamine HCl-MSM (GLUCOSAMINE-MSM PO)   latanoprost (XALATAN) 0.005 % ophthalmic solution   leflunomide (ARAVA) 20 MG tablet   levothyroxine (SYNTHROID) 112 MCG tablet   LINZESS 290 MCG CAPS capsule   Magnesium 100 MG CAPS   metFORMIN (GLUCOPHAGE) 1000 MG tablet   montelukast (SINGULAIR) 10 MG tablet   Multiple Vitamin (MULTIVITAMIN WITH MINERALS) TABS tablet   NOVOLOG FLEXPEN 100 UNIT/ML FlexPen   omeprazole (PRILOSEC) 40 MG capsule   oxyCODONE (ROXICODONE) 5 MG immediate release tablet   potassium chloride (KLOR-CON) 10 MEQ tablet   rosuvastatin (CRESTOR) 40 MG tablet   TRESIBA FLEXTOUCH 200 UNIT/ML FlexTouch Pen   valsartan (DIOVAN) 320 MG tablet   BD PEN NEEDLE NANO U/F 32G X 4 MM MISC   Continuous Blood Gluc Receiver (DEXCOM G6 RECEIVER) DEVI   Continuous Blood Gluc Sensor (DEXCOM G6 SENSOR) MISC   Continuous Blood Gluc Transmit (DEXCOM G6 TRANSMITTER) MISC   glucose blood (ONETOUCH VERIO) test strip   Lancets (ONETOUCH DELICA PLUS [id number])  MISC    Myra Gianotti, PA-C Surgical Short Stay/Anesthesiology [facility] Phone [phone] [facility] Phone [phone] 04/05/2022 2:34 PM

## 2022-04-08 ENCOUNTER — Other Ambulatory Visit: Payer: Self-pay

## 2022-04-08 ENCOUNTER — Ambulatory Visit (HOSPITAL_BASED_OUTPATIENT_CLINIC_OR_DEPARTMENT_OTHER): Payer: Medicare Other | Admitting: Vascular Surgery

## 2022-04-08 ENCOUNTER — Encounter (HOSPITAL_COMMUNITY): Payer: Self-pay | Admitting: Orthopedic Surgery

## 2022-04-08 ENCOUNTER — Ambulatory Visit (HOSPITAL_COMMUNITY)
Admission: RE | Admit: 2022-04-08 | Discharge: 2022-04-08 | Disposition: A | Discharge status: Home / Self Care | Payer: Medicare Other | Source: Ambulatory Visit | Attending: Orthopedic Surgery | Admitting: Orthopedic Surgery

## 2022-04-08 ENCOUNTER — Ambulatory Visit (HOSPITAL_COMMUNITY): Payer: Medicare Other

## 2022-04-08 ENCOUNTER — Ambulatory Visit (HOSPITAL_COMMUNITY): Payer: Medicare Other | Admitting: Vascular Surgery

## 2022-04-08 ENCOUNTER — Encounter (HOSPITAL_COMMUNITY)
Admission: RE | Disposition: A | Discharge status: Home / Self Care | Payer: Self-pay | Source: Ambulatory Visit | Attending: Orthopedic Surgery

## 2022-04-08 DIAGNOSIS — D638 Anemia in other chronic diseases classified elsewhere: Secondary | ICD-10-CM

## 2022-04-08 DIAGNOSIS — E039 Hypothyroidism, unspecified: Secondary | ICD-10-CM | POA: Diagnosis not present

## 2022-04-08 DIAGNOSIS — M8008XA Age-related osteoporosis with current pathological fracture, vertebra(e), initial encounter for fracture: Secondary | ICD-10-CM | POA: Diagnosis not present

## 2022-04-08 DIAGNOSIS — M069 Rheumatoid arthritis, unspecified: Secondary | ICD-10-CM | POA: Diagnosis not present

## 2022-04-08 DIAGNOSIS — I10 Essential (primary) hypertension: Secondary | ICD-10-CM

## 2022-04-08 DIAGNOSIS — Z96652 Presence of left artificial knee joint: Secondary | ICD-10-CM | POA: Insufficient documentation

## 2022-04-08 DIAGNOSIS — M8088XA Other osteoporosis with current pathological fracture, vertebra(e), initial encounter for fracture: Secondary | ICD-10-CM

## 2022-04-08 DIAGNOSIS — E669 Obesity, unspecified: Secondary | ICD-10-CM

## 2022-04-08 DIAGNOSIS — Z87891 Personal history of nicotine dependence: Secondary | ICD-10-CM

## 2022-04-08 DIAGNOSIS — K219 Gastro-esophageal reflux disease without esophagitis: Secondary | ICD-10-CM | POA: Diagnosis not present

## 2022-04-08 DIAGNOSIS — Z79899 Other long term (current) drug therapy: Secondary | ICD-10-CM | POA: Diagnosis not present

## 2022-04-08 DIAGNOSIS — Z6832 Body mass index (BMI) 32.0-32.9, adult: Secondary | ICD-10-CM

## 2022-04-08 DIAGNOSIS — Z794 Long term (current) use of insulin: Secondary | ICD-10-CM | POA: Diagnosis not present

## 2022-04-08 DIAGNOSIS — Z7984 Long term (current) use of oral hypoglycemic drugs: Secondary | ICD-10-CM | POA: Diagnosis not present

## 2022-04-08 DIAGNOSIS — Z981 Arthrodesis status: Secondary | ICD-10-CM | POA: Diagnosis not present

## 2022-04-08 DIAGNOSIS — E7800 Pure hypercholesterolemia, unspecified: Secondary | ICD-10-CM | POA: Diagnosis not present

## 2022-04-08 DIAGNOSIS — D649 Anemia, unspecified: Secondary | ICD-10-CM | POA: Insufficient documentation

## 2022-04-08 DIAGNOSIS — E1144 Type 2 diabetes mellitus with diabetic amyotrophy: Secondary | ICD-10-CM | POA: Diagnosis not present

## 2022-04-08 HISTORY — PX: KYPHOPLASTY: SHX5884

## 2022-04-08 LAB — BASIC METABOLIC PANEL
Anion gap: 15 (ref 5–15)
BUN: 23 mg/dL (ref 8–23)
CO2: 22 mmol/L (ref 22–32)
Calcium: 9.3 mg/dL (ref 8.9–10.3)
Chloride: 100 mmol/L (ref 98–111)
Creatinine, Ser: 1.09 mg/dL — ABNORMAL HIGH (ref 0.44–1.00)
GFR, Estimated: 53 mL/min — ABNORMAL LOW (ref >60)
Glucose, Bld: 89 mg/dL (ref 70–99)
Potassium: 3.4 mmol/L — ABNORMAL LOW (ref 3.5–5.1)
Sodium: 137 mmol/L (ref 135–145)

## 2022-04-08 LAB — CBC
HCT: 35.7 % — ABNORMAL LOW (ref 36.0–46.0)
Hemoglobin: 11.6 g/dL — ABNORMAL LOW (ref 12.0–15.0)
MCH: 27.3 pg (ref 26.0–34.0)
MCHC: 32.5 g/dL (ref 30.0–36.0)
MCV: 84 fL (ref 80.0–100.0)
Platelets: 404 10*3/uL — ABNORMAL HIGH (ref 150–400)
RBC: 4.25 MIL/uL (ref 3.87–5.11)
RDW: 15.3 % (ref 11.5–15.5)
WBC: 7.9 10*3/uL (ref 4.0–10.5)
nRBC: 0 % (ref 0.0–0.2)

## 2022-04-08 LAB — SURGICAL PCR SCREEN
MRSA, PCR: NEGATIVE
Staphylococcus aureus: NEGATIVE

## 2022-04-08 LAB — GLUCOSE, CAPILLARY
Glucose-Capillary: 105 mg/dL — ABNORMAL HIGH (ref 70–99)
Glucose-Capillary: 74 mg/dL (ref 70–99)
Glucose-Capillary: 76 mg/dL (ref 70–99)

## 2022-04-08 SURGERY — KYPHOPLASTY
Anesthesia: Monitor Anesthesia Care | Site: Back

## 2022-04-08 MED ORDER — MEPERIDINE HCL 25 MG/ML IJ SOLN: 6.2500 mg | INTRAMUSCULAR | Status: DC | PRN

## 2022-04-08 MED ORDER — IOPAMIDOL (ISOVUE-300) INJECTION 61%
INTRAVENOUS | Status: DC | PRN
  Administered 2022-04-08: 10 mL

## 2022-04-08 MED ORDER — PROPOFOL 10 MG/ML IV BOLUS
INTRAVENOUS | Status: DC | PRN
  Administered 2022-04-08: 30 mg via INTRAVENOUS

## 2022-04-08 MED ORDER — ONDANSETRON HCL 4 MG PO TABS: 4.0000 mg | ORAL_TABLET | Freq: Three times a day (TID) | ORAL | 0 refills | Status: AC | PRN

## 2022-04-08 MED ORDER — INSULIN ASPART 100 UNIT/ML IJ SOLN: 0.0000 [IU] | INTRAMUSCULAR | Status: DC | PRN

## 2022-04-08 MED ORDER — OXYCODONE HCL 5 MG PO TABS: 5.0000 mg | ORAL_TABLET | Freq: Once | ORAL | Status: DC | PRN

## 2022-04-08 MED ORDER — OXYCODONE-ACETAMINOPHEN 10-325 MG PO TABS: 1.0000 | ORAL_TABLET | Freq: Four times a day (QID) | ORAL | 0 refills | Status: AC | PRN

## 2022-04-08 MED ORDER — CHLORHEXIDINE GLUCONATE 0.12 % MT SOLN: 15.0000 mL | Freq: Once | OROMUCOSAL | Status: AC

## 2022-04-08 MED ORDER — 0.9 % SODIUM CHLORIDE (POUR BTL) OPTIME
TOPICAL | Status: DC | PRN
  Administered 2022-04-08: 1000 mL

## 2022-04-08 MED ORDER — FENTANYL CITRATE (PF) 100 MCG/2ML IJ SOLN: 25.0000 ug | INTRAMUSCULAR | Status: DC | PRN

## 2022-04-08 MED ORDER — BUPIVACAINE-EPINEPHRINE 0.25% -1:200000 IJ SOLN
INTRAMUSCULAR | Status: DC | PRN
  Administered 2022-04-08: 20 mL

## 2022-04-08 MED ORDER — PROPOFOL 10 MG/ML IV BOLUS
INTRAVENOUS | Status: AC
  Filled 2022-04-08: qty 20

## 2022-04-08 MED ORDER — LIDOCAINE 2% (20 MG/ML) 5 ML SYRINGE
INTRAMUSCULAR | Status: DC | PRN
  Administered 2022-04-08: 60 mg via INTRAVENOUS

## 2022-04-08 MED ORDER — ACETAMINOPHEN 10 MG/ML IV SOLN
INTRAVENOUS | Status: DC | PRN
  Administered 2022-04-08: 1000 mg via INTRAVENOUS

## 2022-04-08 MED ORDER — PROPOFOL 500 MG/50ML IV EMUL
INTRAVENOUS | Status: DC | PRN
  Administered 2022-04-08: 100 ug/kg/min via INTRAVENOUS

## 2022-04-08 MED ORDER — CEFAZOLIN SODIUM-DEXTROSE 2-3 GM-%(50ML) IV SOLR
INTRAVENOUS | Status: DC | PRN
  Administered 2022-04-08: 2 g via INTRAVENOUS

## 2022-04-08 MED ORDER — METHOCARBAMOL 500 MG PO TABS: 500.0000 mg | ORAL_TABLET | Freq: Three times a day (TID) | ORAL | 0 refills | Status: AC | PRN

## 2022-04-08 MED ORDER — PROMETHAZINE HCL 25 MG/ML IJ SOLN: 6.2500 mg | INTRAMUSCULAR | Status: DC | PRN

## 2022-04-08 MED ORDER — EPINEPHRINE PF 1 MG/ML IJ SOLN
INTRAMUSCULAR | Status: AC
  Filled 2022-04-08: qty 1

## 2022-04-08 MED ORDER — BUPIVACAINE LIPOSOME 1.3 % IJ SUSP
INTRAMUSCULAR | Status: DC | PRN
  Administered 2022-04-08: 20 mL

## 2022-04-08 MED ORDER — FENTANYL CITRATE (PF) 250 MCG/5ML IJ SOLN
INTRAMUSCULAR | Status: AC
  Filled 2022-04-08: qty 5

## 2022-04-08 MED ORDER — ACETAMINOPHEN 500 MG PO TABS: 1000.0000 mg | ORAL_TABLET | Freq: Once | ORAL | Status: DC

## 2022-04-08 MED ORDER — BUPIVACAINE HCL (PF) 0.25 % IJ SOLN
INTRAMUSCULAR | Status: AC
  Filled 2022-04-08: qty 30

## 2022-04-08 MED ORDER — ACETAMINOPHEN 10 MG/ML IV SOLN
INTRAVENOUS | Status: AC
  Filled 2022-04-08: qty 100

## 2022-04-08 MED ORDER — ORAL CARE MOUTH RINSE: 15.0000 mL | Freq: Once | OROMUCOSAL | Status: AC

## 2022-04-08 MED ORDER — LACTATED RINGERS IV SOLN: INTRAVENOUS | Status: DC

## 2022-04-08 MED ORDER — CHLORHEXIDINE GLUCONATE 0.12 % MT SOLN
OROMUCOSAL | Status: AC
  Administered 2022-04-08: 15 mL via OROMUCOSAL
  Filled 2022-04-08: qty 15

## 2022-04-08 MED ORDER — PHENYLEPHRINE 80 MCG/ML (10ML) SYRINGE FOR IV PUSH (FOR BLOOD PRESSURE SUPPORT)
PREFILLED_SYRINGE | INTRAVENOUS | Status: DC | PRN
  Administered 2022-04-08: 80 ug via INTRAVENOUS

## 2022-04-08 MED ORDER — FENTANYL CITRATE (PF) 250 MCG/5ML IJ SOLN
INTRAMUSCULAR | Status: DC | PRN
  Administered 2022-04-08 (×2): 25 ug via INTRAVENOUS

## 2022-04-08 MED ORDER — OXYCODONE HCL 5 MG/5ML PO SOLN: 5.0000 mg | Freq: Once | ORAL | Status: DC | PRN

## 2022-04-08 MED ORDER — BUPIVACAINE LIPOSOME 1.3 % IJ SUSP
INTRAMUSCULAR | Status: AC
  Filled 2022-04-08: qty 20

## 2022-04-08 SURGICAL SUPPLY — 44 items
ADH SKN CLS APL DERMABOND .7 (GAUZE/BANDAGES/DRESSINGS) ×1
BAG COUNTER SPONGE SURGICOUNT (BAG) IMPLANT
BAG SPNG CNTER NS LX DISP (BAG)
BLADE SURG 15 STRL LF DISP TIS (BLADE) ×1 IMPLANT
BLADE SURG 15 STRL SS (BLADE) ×1
BNDG ADH 1X3 SHEER STRL LF (GAUZE/BANDAGES/DRESSINGS) ×2 IMPLANT
BNDG ADH THN 3X1 STRL LF (GAUZE/BANDAGES/DRESSINGS) ×2
CEMENT KYPHON CX01A KIT/MIXER (Cement) ×1 IMPLANT
COVER MAYO STAND STRL (DRAPES) ×1 IMPLANT
COVER SURGICAL LIGHT HANDLE (MISCELLANEOUS) ×1 IMPLANT
CURETTE EXPRESS SZ2 7MM (INSTRUMENTS) IMPLANT
CURETTE WEDGE 8.5MM KYPHX (MISCELLANEOUS) IMPLANT
CURRETTE EXPRESS SZ2 7MM (INSTRUMENTS)
DERMABOND ADVANCED .7 DNX12 (GAUZE/BANDAGES/DRESSINGS) ×1 IMPLANT
DRAIN CHANNEL 15F RND FF W/TCR (WOUND CARE) IMPLANT
DRAPE C-ARM 42X72 X-RAY (DRAPES) ×2 IMPLANT
DRAPE INCISE IOBAN 66X45 STRL (DRAPES) ×1 IMPLANT
DRAPE LAPAROTOMY T 102X78X121 (DRAPES) ×1 IMPLANT
DRAPE WARM FLUID 44X44 (DRAPES) ×1 IMPLANT
DURAPREP 26ML APPLICATOR (WOUND CARE) ×1 IMPLANT
GLOVE BIO SURGEON STRL SZ 6.5 (GLOVE) ×1 IMPLANT
GLOVE BIOGEL PI IND STRL 6.5 (GLOVE) ×1 IMPLANT
GLOVE BIOGEL PI IND STRL 8.5 (GLOVE) IMPLANT
GLOVE SS BIOGEL STRL SZ 8.5 (GLOVE) ×1 IMPLANT
GOWN STRL REUS W/ TWL LRG LVL3 (GOWN DISPOSABLE) ×2 IMPLANT
GOWN STRL REUS W/TWL 2XL LVL3 (GOWN DISPOSABLE) ×1 IMPLANT
GOWN STRL REUS W/TWL LRG LVL3 (GOWN DISPOSABLE) ×2
INTRODUCER DEVICE OSTEO LEVEL (INTRODUCER) IMPLANT
KIT BASIN OR (CUSTOM PROCEDURE TRAY) ×1 IMPLANT
KIT TURNOVER KIT B (KITS) ×1 IMPLANT
NDL SPNL 22GX3.5 QUINCKE BK (NEEDLE) ×1 IMPLANT
NEEDLE HYPO 22GX1.5 SAFETY (NEEDLE) ×1 IMPLANT
NEEDLE SPNL 22GX3.5 QUINCKE BK (NEEDLE) ×1 IMPLANT
NS IRRIG 1000ML POUR BTL (IV SOLUTION) ×1 IMPLANT
PACK BASIC III (CUSTOM PROCEDURE TRAY) ×1
PACK SRG BSC III STRL LF ECLPS (CUSTOM PROCEDURE TRAY) ×1 IMPLANT
PAD ARMBOARD 7.5X6 YLW CONV (MISCELLANEOUS) ×2 IMPLANT
SPONGE T-LAP 4X18 ~~LOC~~+RFID (SPONGE) ×1 IMPLANT
SUT MNCRL AB 3-0 PS2 18 (SUTURE) ×1 IMPLANT
SYR CONTROL 10ML LL (SYRINGE) ×1 IMPLANT
TOWEL GREEN STERILE (TOWEL DISPOSABLE) ×1 IMPLANT
TRAY KYPHOPAK 15/3 ONESTEP 1ST (MISCELLANEOUS) IMPLANT
TRAY KYPHOPAK 20/3 ONESTEP 1ST (MISCELLANEOUS) IMPLANT
WATER STERILE IRR 1000ML POUR (IV SOLUTION) ×1 IMPLANT

## 2022-04-08 NOTE — H&P (Signed)
History: Marissa Roberts is a very pleasant 77 year old with severe mid lumbar pain since a fall on 02/21/2022. Despite medications, activity modifications, and brace she continues to have severe debilitating pain. Imaging confirmed a compression fracture of L1. As result she presents today to move forward with a L1 kyphoplasty.  Past Medical History:  Diagnosis Date   Anemia    Arthritis    Bronchitis    hx of   Diabetes mellitus    type 2   Family history of adverse reaction to anesthesia    per patient, "daughter threw up after a knee surgery"   GERD (gastroesophageal reflux disease)    Hypercholesteremia    Hypertension    Hypothyroidism    Pneumonia    hx of   PONV (postoperative nausea and vomiting)    Thyroid disease     Allergies  Allergen Reactions   Contrast Media [Iodinated Contrast Media]     Throat swells from ct contra   Adhesive [Tape]     No paper tape, causes blisters   Erythromycin Rash   Penicillins Rash    Did it involve swelling of the face/tongue/throat, SOB, or low BP? No Did it involve sudden or severe rash/hives, skin peeling, or any reaction on the inside of your mouth or nose? No Did you need to seek medical attention at a hospital or doctor's office? No When did it last happen?    Many years If all above answers are "NO", may proceed with cephalosporin use.     No current facility-administered medications on file prior to encounter.   Current Outpatient Medications on File Prior to Encounter  Medication Sig Dispense Refill   amLODipine (NORVASC) 10 MG tablet Take 1 tablet by mouth once daily 90 tablet 1   Biotin 10000 MCG TABS Take 10,000 mcg by mouth daily.     calcitonin, salmon, (MIACALCIN) 200 UNIT/ACT nasal spray Place 1 spray into alternate nostrils daily. 3.7 mL 2   Calcium Carb-Cholecalciferol (CALCIUM 600 + D PO) Take 1 tablet by mouth daily.     cyclobenzaprine (FLEXERIL) 10 MG tablet Take 1 tablet (10 mg total) by mouth 2 (two)  times daily as needed for muscle spasms. (Patient taking differently: Take 5-10 mg by mouth 2 (two) times daily as needed for muscle spasms.) 20 tablet 0   FARXIGA 10 MG TABS tablet TAKE 1 TABLET BY MOUTH ONCE DAILY BEFORE BREAKFAST (Patient taking differently: Take 5 mg by mouth daily before breakfast.) 90 tablet 0   furosemide (LASIX) 40 MG tablet Take 2 tablets by mouth once daily (Patient taking differently: Take 40 mg by mouth daily.) 180 tablet 1   Glucosamine HCl-MSM (GLUCOSAMINE-MSM PO) Take 1 tablet by mouth daily.     latanoprost (XALATAN) 0.005 % ophthalmic solution SMARTSIG:1 Drop(s) In Eye(s) Every Evening     leflunomide (ARAVA) 20 MG tablet Take 1 tablet (20 mg total) by mouth daily. 90 tablet 0   levothyroxine (SYNTHROID) 112 MCG tablet TAKE 1 TABLET BY MOUTH BEFORE BREAKFAST 90 tablet 0   LINZESS 290 MCG CAPS capsule TAKE 1 CAPSULE BY MOUTH ONCE DAILY AS NEEDED 90 capsule 0   Magnesium 100 MG CAPS Take 100 mg by mouth daily.     metFORMIN (GLUCOPHAGE) 1000 MG tablet TAKE 1 TABLET BY MOUTH TWICE DAILY WITH A MEAL 180 tablet 3   montelukast (SINGULAIR) 10 MG tablet TAKE 1 TABLET BY MOUTH AT BEDTIME 90 tablet 1   Multiple Vitamin (MULTIVITAMIN WITH MINERALS) TABS  tablet Take 1 tablet by mouth daily.     NOVOLOG FLEXPEN 100 UNIT/ML FlexPen INJECT 10 TO 15 UNITS SUBCUTANEOUSLY THREE TIMES DAILY BEFORE MEAL(S) (Patient taking differently: Inject 0-10 Units into the skin 3 (three) times daily with meals. Sliding scale) 15 mL 0   omeprazole (PRILOSEC) 40 MG capsule Take 40 mg by mouth daily.     oxyCODONE (ROXICODONE) 5 MG immediate release tablet Take 1 tablet (5 mg total) by mouth 3 (three) times daily as needed for severe pain. 15 tablet 0   potassium chloride (KLOR-CON) 10 MEQ tablet TAKE 1  BY MOUTH ONCE DAILY 180 tablet 1   rosuvastatin (CRESTOR) 40 MG tablet Take 1 tablet by mouth once daily 90 tablet 1   TRESIBA FLEXTOUCH 200 UNIT/ML FlexTouch Pen INJECT 36 UNITS SUBCUTANEOUSLY IN  THE MORNING (Patient taking differently: Inject 34 Units into the skin every morning.) 9 mL 2   valsartan (DIOVAN) 320 MG tablet Take 1 tablet (320 mg total) by mouth daily. 90 tablet 3   BD PEN NEEDLE NANO U/F 32G X 4 MM MISC USE 1  4 TIMES DAILY TO  INJECT  INSULIN 200 each 0   clotrimazole (LOTRIMIN) 1 % cream Apply to both feet and between toes twice daily for 6 weeks. (Patient taking differently: Apply 1 Application topically 2 (two) times daily as needed (Apply to both feet and between toes).) 85 g 1   Continuous Blood Gluc Receiver (DEXCOM G6 RECEIVER) DEVI For sensor monitoring 1 each 0   Continuous Blood Gluc Sensor (DEXCOM G6 SENSOR) MISC Use to monitor blood sugar, change after 10 days 9 each 3   Continuous Blood Gluc Transmit (DEXCOM G6 TRANSMITTER) MISC 4 PER YEAR 1 each 3   diazepam (VALIUM) 2 MG tablet Take 2 mg by mouth as needed (MRI).     diclofenac Sodium (VOLTAREN) 1 % GEL Apply 2-4 grams to affected joint 4 times daily as needed. (Patient taking differently: Apply 2 g topically See admin instructions. Apply 2-4 grams to affected joint 4 times daily as needed.) 400 g 2   doxycycline (VIBRAMYCIN) 100 MG capsule 2 po 1 hour prior to dental procedure 10 capsule 0   fluticasone (FLONASE) 50 MCG/ACT nasal spray Place 2 sprays into both nostrils 2 (two) times daily as needed for allergies or rhinitis.     glucose blood (ONETOUCH VERIO) test strip CHECK BLOOD SUGAR 4 TIMES DAILY 400 each 3   Lancets (ONETOUCH DELICA PLUS [id number]) MISC USE TO CHECK BLOOD SUGAR UP TO THREE TIMES A DAY 2 HOURS AFTER A MEAL AND UP TO 3 TIMES A WEEK UPON WAKING UP 300 each 3   [DISCONTINUED] Saxagliptin-Metformin 07-998 MG TB24 Take 1 tablet by mouth daily. 30 tablet     Physical Exam: Vitals:   04/08/22 1259  BP: (!) 114/56  Pulse: 84  Resp: 18  Temp: 98 F (36.7 C)  SpO2: 96%   Body mass index is 32.62 kg/m. Clinical exam: Marissa Roberts is a pleasant individual, who appears younger than their stated  age.  She is alert and orientated 3.  No shortness of breath, chest pain.  Abdomen is soft and non-tender, negative loss of bowel and bladder control, no rebound tenderness.  Negative: skin lesions abrasions contusions  Peripheral pulses: 2+ peripheral pulses bilaterally. LE compartments are: Soft and nontender.  Gait pattern: Altered gait pattern due to severe back pain  Assistive devices: Roller walker  Neuro: 5/5 motor strength in the lower extremity bilaterally. Negative nerve  root tension signs. Intact sensation to light touch in the lower extremity but she does describe some occasional dysesthesias. Negative Babinski test, no clonus, 1+ deep tendon reflexes.  Musculoskeletal: Severe mid lumbar pain with direct palpation in the midline radiating into the paraspinal region. No gluteal/SI joint pain.  Imaging: X-rays of the lumbar spine taken today demonstrate an L1 compression fracture with significant loss of anterior height. No additional fractures are seen. Mild degenerative changes are noted throughout the remainder of the lumbar spine.  Lumbar MRI: completed on 03/15/2022 was reviewed with the patient. It was completed at [facility]; I have independently reviewed the images as well as the radiology report. Compression fracture of L1 with 40% loss of height. No significant central or foraminal stenosis. Moderate degenerative changes L2-S1 but no high-grade neural compression is noted. No abnormal marrow signal changes.   A/P: Summary: Marissa Roberts is a very pleasant 77 year old with a 6-week history of severe progressive lumbar pain. Imaging confirms an osteoporotic compression fracture of L1 secondary to a fall from a standing height. At this point due to the severe pain and loss in quality of life she is elected to move forward with a kyphoplasty. I have gone over the surgical procedure in great detail with her and all of her questions were addressed.  Risks of kyphoplasty  include: Infection, bleeding, nerve damage, death, stroke, paralysis. Leak of cement ongoing or worse pain, need for additional surgery including other fracture levels.

## 2022-04-08 NOTE — Brief Op Note (Signed)
04/08/2022  5:14 PM  PATIENT:  Marissa Roberts  77 y.o. female  PRE-OPERATIVE DIAGNOSIS:  L1 osteoportic compression fracture  POST-OPERATIVE DIAGNOSIS:  L1 osteoportic compression fracture  PROCEDURE:  Procedure(s) with comments: KYPHOPLASTY L1 (N/A) - Local with IV Regional  SURGEON:  Surgeon(s) and Role:    Melina Schools, MD - Primary  PHYSICIAN ASSISTANT:   ASSISTANTS: none   ANESTHESIA:   local and IV sedation  EBL:  minimal   BLOOD ADMINISTERED:none  DRAINS: none   LOCAL MEDICATIONS USED:  MARCAINE    and OTHER exparel  SPECIMEN:  No Specimen  DISPOSITION OF SPECIMEN:  N/A  COUNTS:  YES  TOURNIQUET:  * No tourniquets in log *  DICTATION: .Dragon Dictation  PLAN OF CARE: Discharge to home after PACU  PATIENT DISPOSITION:  PACU - hemodynamically stable.

## 2022-04-08 NOTE — Transfer of Care (Signed)
Immediate Anesthesia Transfer of Care Note  Patient: [place]  Procedure(s) Performed: KYPHOPLASTY L1 (Back)  Patient Location: PACU  Anesthesia Type:MAC  Level of Consciousness: drowsy and patient cooperative  Airway & Oxygen Therapy: Patient Spontanous Breathing  Post-op Assessment: Report given to RN and Post -op Vital signs reviewed and stable  Post vital signs: Reviewed and stable  Last Vitals:  Vitals Value Taken Time  BP 117/47 04/08/22 1716  Temp 37.2 C 04/08/22 1715  Pulse 73 04/08/22 1719  Resp 21 04/08/22 1719  SpO2 93 % 04/08/22 1719  Vitals shown include unvalidated device data.  Last Pain:  Vitals:   04/08/22 1715  TempSrc:   PainSc: 0-No pain      Patients Stated Pain Goal: 0 (79/39/03 0092)  Complications: No notable events documented.

## 2022-04-08 NOTE — Op Note (Signed)
OPERATIVE REPORT  DATE OF SURGERY: 04/08/2022  PATIENT NAME:  Marissa Roberts MRN: [id number]  DOB: 1945/12/15  PCP: [name], MD  PRE-OPERATIVE DIAGNOSIS: L1 osteoporotic compression fracture  POST-OPERATIVE DIAGNOSIS: Same  PROCEDURE:   L1 kyphoplasty  SURGEON:  Melina Schools, MD  PHYSICIAN ASSISTANT: None  ANESTHESIA:   IV sedation with local anesthesia  EBL: Minimal   Complications: None  BRIEF HISTORY: Marissa Roberts is a 77 y.o. female who presents to my care with severe lumbar pain after fall.  Imaging demonstrated an L1 compression fracture.  Because of the severity of her pain we elected to move forward with surgery.  All appropriate risks, benefits, and alternatives were discussed with the patient and consent was obtained.  PROCEDURE DETAILS: Patient was brought into the operating room and was properly positioned on the operating room table.    A timeout was taken to confirm all important data: including patient, procedure, and the level. Teds, SCD's were applied.   IV sedation was performed by the anesthesiologist and the lumbar spine was prepped and draped in standard fashion.  I identified the lateral borders of the L1 pedicle and marked amount.  I infiltrated this area with quarter percent Marcaine with epinephrine.  Using a spinal needle I then advanced down to the lateral aspect of the facet complex and confirmed position with fluoroscopy.  I then injected local anesthesia in and around the facet joint and paraspinal muscles.  A small stab incision was made and the Jamshidi needle was advanced percutaneously to the lateral aspect of the L1 pedicle.  I then advanced into the L1 pedicle.  As I proceeded through the pedicle I confirmed proper trajectory and position with AP and lateral fluoroscopy.  As I neared the medial wall of the pedicle on the AP view I confirmed I was just beyond the posterior wall on the lateral view and advanced into the vertebral  body.  This exact same technique was repeated on the contralateral side.  Once both pedicles were cannulated I then used the drill to create my whole and then sounded it to ensure that it was solid.  I then inserted the inflatable bone tamps and inflated them until I had an excellent fill in the anterior two thirds of the vertebral body.  I then removed 1 balloon and inserted the cement.  This is approximately 1-1/2 cc of cement.  I then deflated the other balloon and inserted a similar amount on the contralateral side.  I then inserted a another 1-1/2 cc on each side for total of 6 cc.  I had excellent fill in the anterior two thirds of the vertebral body and is across the midline and was supporting the fractured superior endplate.  I confirmed satisfactory cement positioning in both the AP and lateral planes.  The cement was allowed to harden and then the trocars were removed.  I injected additional local anesthesia with quarter percent Marcaine with Exparel.  I then placed a simple 3-0 Monocryl stitch and then Dermabond and a Band-Aid.  The patient was then transferred to the PACU without incident.  The end of the case all needle sponge counts were correct.  There were no adverse intraoperative events.  Melina Schools, MD 04/08/2022 5:09 PM

## 2022-04-09 ENCOUNTER — Encounter (HOSPITAL_COMMUNITY): Payer: Self-pay | Admitting: Orthopedic Surgery

## 2022-04-10 NOTE — Anesthesia Postprocedure Evaluation (Signed)
Anesthesia Post Note  Patient: Marissa Roberts  Procedure(s) Performed: KYPHOPLASTY L1 (Back)     Patient location during evaluation: PACU Anesthesia Type: MAC Level of consciousness: awake and alert Pain management: pain level controlled Vital Signs Assessment: post-procedure vital signs reviewed and stable Respiratory status: spontaneous breathing, nonlabored ventilation, respiratory function stable and patient connected to nasal cannula oxygen Cardiovascular status: stable and blood pressure returned to baseline Postop Assessment: no apparent nausea or vomiting Anesthetic complications: no   No notable events documented.  Last Vitals:  Vitals:   04/08/22 1731 04/08/22 1745  BP: 118/68 (!) 122/54  Pulse: 65 73  Resp: 18 16  Temp:  37.2 C  SpO2: 96% 97%    Last Pain:  Vitals:   04/08/22 1745  TempSrc:   PainSc: 0-No pain                 Madissen Wyse S

## 2022-04-23 ENCOUNTER — Ambulatory Visit: Payer: Medicare Other | Admitting: Dermatology

## 2022-04-23 DIAGNOSIS — Z4889 Encounter for other specified surgical aftercare: Secondary | ICD-10-CM | POA: Diagnosis not present

## 2022-04-25 DIAGNOSIS — E119 Type 2 diabetes mellitus without complications: Secondary | ICD-10-CM | POA: Diagnosis not present

## 2022-04-25 DIAGNOSIS — Z1231 Encounter for screening mammogram for malignant neoplasm of breast: Secondary | ICD-10-CM | POA: Diagnosis not present

## 2022-04-25 DIAGNOSIS — E1165 Type 2 diabetes mellitus with hyperglycemia: Secondary | ICD-10-CM | POA: Diagnosis not present

## 2022-04-25 DIAGNOSIS — Z6833 Body mass index (BMI) 33.0-33.9, adult: Secondary | ICD-10-CM | POA: Diagnosis not present

## 2022-04-25 DIAGNOSIS — Z01419 Encounter for gynecological examination (general) (routine) without abnormal findings: Secondary | ICD-10-CM | POA: Diagnosis not present

## 2022-04-26 DIAGNOSIS — M5451 Vertebrogenic low back pain: Secondary | ICD-10-CM | POA: Diagnosis not present

## 2022-04-29 DIAGNOSIS — E119 Type 2 diabetes mellitus without complications: Secondary | ICD-10-CM | POA: Diagnosis not present

## 2022-04-30 ENCOUNTER — Telehealth: Payer: Self-pay | Admitting: Nutrition

## 2022-04-30 NOTE — Telephone Encounter (Signed)
Patient has just received her order for Dexcom G6, but says her insurance will not longer use [facility] now.  She is needing to change to [facility] and would you please order her the Dexcom G7 sensors.  1 q 10 days for a 3 month supply.  Thank you

## 2022-04-30 NOTE — Telephone Encounter (Signed)
Ordered placed on Parach[facility]

## 2022-05-04 ENCOUNTER — Other Ambulatory Visit: Payer: Self-pay | Admitting: Internal Medicine

## 2022-05-04 ENCOUNTER — Other Ambulatory Visit: Payer: Self-pay | Admitting: Endocrinology

## 2022-05-04 DIAGNOSIS — I10 Essential (primary) hypertension: Secondary | ICD-10-CM

## 2022-05-06 ENCOUNTER — Ambulatory Visit: Payer: Medicare Other | Admitting: Podiatry

## 2022-05-07 ENCOUNTER — Other Ambulatory Visit: Payer: Self-pay | Admitting: Endocrinology

## 2022-05-07 ENCOUNTER — Encounter: Payer: Self-pay | Admitting: Podiatry

## 2022-05-07 ENCOUNTER — Ambulatory Visit (INDEPENDENT_AMBULATORY_CARE_PROVIDER_SITE_OTHER): Payer: Medicare Other | Admitting: Podiatry

## 2022-05-07 DIAGNOSIS — B351 Tinea unguium: Secondary | ICD-10-CM

## 2022-05-07 DIAGNOSIS — M79674 Pain in right toe(s): Secondary | ICD-10-CM | POA: Diagnosis not present

## 2022-05-07 DIAGNOSIS — M79675 Pain in left toe(s): Secondary | ICD-10-CM

## 2022-05-07 DIAGNOSIS — E119 Type 2 diabetes mellitus without complications: Secondary | ICD-10-CM

## 2022-05-07 DIAGNOSIS — Q828 Other specified congenital malformations of skin: Secondary | ICD-10-CM

## 2022-05-07 DIAGNOSIS — Z794 Long term (current) use of insulin: Secondary | ICD-10-CM

## 2022-05-07 NOTE — Progress Notes (Unsigned)
  Subjective:  Patient ID: Marissa Roberts, female    DOB: 1945/10/26,  MRN: [id number]  Marissa Roberts presents to clinic today for {jgcomplaint:23593}  Chief Complaint  Patient presents with   Follow-up    Patient is a diabetic B.S.am-129 Last A1c- PCP- Dr. Pricilla Holm, MD/ Last visit-unknown Patient is taking all medications prescribed.   New problem(s): None. {jgcomplaint:23593}  PCP is [name], MD.  Allergies  Allergen Reactions   Contrast Media [Iodinated Contrast Media]     Throat swells from ct contra   Adhesive [Tape]     No paper tape, causes blisters   Erythromycin Rash   Penicillins Rash    Did it involve swelling of the face/tongue/throat, SOB, or low BP? No Did it involve sudden or severe rash/hives, skin peeling, or any reaction on the inside of your mouth or nose? No Did you need to seek medical attention at a hospital or doctor's office? No When did it last happen?    Many years If all above answers are "NO", may proceed with cephalosporin use.     Review of Systems: Negative except as noted in the HPI.  Objective: No changes noted in today's physical examination. Vitals:   [place] is a pleasant 77 y.o. female {jgbodyhabitus:24098} AAO x 3.  Vascular Examination: Capillary fill time to digits <3 seconds b/l lower extremities. Intact b/l with palpable pedal pulses. No edema. No pain with calf compression b/l. Skin temperature gradient WNL b/l.   Neurological Examination: Sensation grossly intact b/l with 10 gram monofilament. Vibratory sensation intact b/l. Pt has subjective symptoms of neuropathy.  Dermatological Examination: Skin warm and supple b/l lower extremities.   Porokeratotic lesion(s) R 4th toe. No erythema, no edema, no drainage, no fluctuance.   Toenails 1-5 b/l thick, discolored, elongated with subungual debris and pain on dorsal palpation.   Musculoskeletal Examination: Normal muscle strength 5/5  to all lower extremity muscle groups bilaterally. Hammertoe(s) noted to the L 5th toe and R 5th toe.  Assessment/Plan: 1. Pain due to onychomycosis of toenails of both feet   2. Porokeratosis   3. Type 2 diabetes mellitus without complication, with long-term current use of insulin (HCC)     No orders of the defined types were placed in this encounter.   None {Jgplan:23602::"-Patient/POA to call should there be question/concern in the interim."}   Return in about 3 months (around 08/05/2022).  Marzetta Board, DPM

## 2022-05-08 DIAGNOSIS — E1165 Type 2 diabetes mellitus with hyperglycemia: Secondary | ICD-10-CM | POA: Diagnosis not present

## 2022-05-09 DIAGNOSIS — M5451 Vertebrogenic low back pain: Secondary | ICD-10-CM | POA: Diagnosis not present

## 2022-05-11 ENCOUNTER — Other Ambulatory Visit: Payer: Self-pay | Admitting: Endocrinology

## 2022-05-16 DIAGNOSIS — M5451 Vertebrogenic low back pain: Secondary | ICD-10-CM | POA: Diagnosis not present

## 2022-05-18 DIAGNOSIS — K219 Gastro-esophageal reflux disease without esophagitis: Secondary | ICD-10-CM | POA: Diagnosis not present

## 2022-05-18 DIAGNOSIS — K5904 Chronic idiopathic constipation: Secondary | ICD-10-CM | POA: Diagnosis not present

## 2022-05-18 DIAGNOSIS — K2270 Barrett's esophagus without dysplasia: Secondary | ICD-10-CM | POA: Diagnosis not present

## 2022-05-18 DIAGNOSIS — R1030 Lower abdominal pain, unspecified: Secondary | ICD-10-CM | POA: Diagnosis not present

## 2022-05-20 DIAGNOSIS — M5451 Vertebrogenic low back pain: Secondary | ICD-10-CM | POA: Diagnosis not present

## 2022-05-22 DIAGNOSIS — M5459 Other low back pain: Secondary | ICD-10-CM | POA: Diagnosis not present

## 2022-05-23 DIAGNOSIS — M5451 Vertebrogenic low back pain: Secondary | ICD-10-CM | POA: Diagnosis not present

## 2022-05-26 DIAGNOSIS — E1165 Type 2 diabetes mellitus with hyperglycemia: Secondary | ICD-10-CM | POA: Diagnosis not present

## 2022-05-26 DIAGNOSIS — E119 Type 2 diabetes mellitus without complications: Secondary | ICD-10-CM | POA: Diagnosis not present

## 2022-05-30 DIAGNOSIS — M5451 Vertebrogenic low back pain: Secondary | ICD-10-CM | POA: Diagnosis not present

## 2022-06-03 ENCOUNTER — Other Ambulatory Visit: Payer: Self-pay | Admitting: Internal Medicine

## 2022-06-03 DIAGNOSIS — M5451 Vertebrogenic low back pain: Secondary | ICD-10-CM | POA: Diagnosis not present

## 2022-06-06 DIAGNOSIS — M5451 Vertebrogenic low back pain: Secondary | ICD-10-CM | POA: Diagnosis not present

## 2022-06-07 DIAGNOSIS — E1165 Type 2 diabetes mellitus with hyperglycemia: Secondary | ICD-10-CM | POA: Diagnosis not present

## 2022-06-10 DIAGNOSIS — M5451 Vertebrogenic low back pain: Secondary | ICD-10-CM | POA: Diagnosis not present

## 2022-06-17 DIAGNOSIS — K219 Gastro-esophageal reflux disease without esophagitis: Secondary | ICD-10-CM | POA: Diagnosis not present

## 2022-06-17 DIAGNOSIS — K648 Other hemorrhoids: Secondary | ICD-10-CM | POA: Diagnosis not present

## 2022-06-17 DIAGNOSIS — R1084 Generalized abdominal pain: Secondary | ICD-10-CM | POA: Diagnosis not present

## 2022-06-17 DIAGNOSIS — M5451 Vertebrogenic low back pain: Secondary | ICD-10-CM | POA: Diagnosis not present

## 2022-06-17 DIAGNOSIS — K5904 Chronic idiopathic constipation: Secondary | ICD-10-CM | POA: Diagnosis not present

## 2022-06-21 DIAGNOSIS — M5451 Vertebrogenic low back pain: Secondary | ICD-10-CM | POA: Diagnosis not present

## 2022-06-23 DIAGNOSIS — E1165 Type 2 diabetes mellitus with hyperglycemia: Secondary | ICD-10-CM | POA: Diagnosis not present

## 2022-06-23 DIAGNOSIS — E119 Type 2 diabetes mellitus without complications: Secondary | ICD-10-CM | POA: Diagnosis not present

## 2022-06-24 ENCOUNTER — Other Ambulatory Visit: Payer: Self-pay | Admitting: Endocrinology

## 2022-06-24 ENCOUNTER — Ambulatory Visit
Admission: EM | Admit: 2022-06-24 | Discharge: 2022-06-24 | Disposition: A | Discharge status: Home / Self Care | Payer: Medicare Other | Attending: Urgent Care | Admitting: Urgent Care

## 2022-06-24 DIAGNOSIS — R1084 Generalized abdominal pain: Secondary | ICD-10-CM | POA: Insufficient documentation

## 2022-06-24 DIAGNOSIS — G8929 Other chronic pain: Secondary | ICD-10-CM | POA: Diagnosis not present

## 2022-06-24 DIAGNOSIS — M545 Low back pain, unspecified: Secondary | ICD-10-CM | POA: Diagnosis not present

## 2022-06-24 DIAGNOSIS — R102 Pelvic and perineal pain: Secondary | ICD-10-CM | POA: Insufficient documentation

## 2022-06-24 DIAGNOSIS — E063 Autoimmune thyroiditis: Secondary | ICD-10-CM

## 2022-06-24 LAB — POCT URINALYSIS DIP (MANUAL ENTRY)
Bilirubin, UA: NEGATIVE
Blood, UA: NEGATIVE
Glucose, UA: >=1000 mg/dL — AB
Leukocytes, UA: NEGATIVE
Nitrite, UA: NEGATIVE
Protein Ur, POC: 100 mg/dL — AB
Spec Grav, UA: 1.025 (ref 1.010–1.025)
Urobilinogen, UA: 0.2 E.U./dL
pH, UA: 5 (ref 5.0–8.0)

## 2022-06-24 MED ORDER — ACETAMINOPHEN 325 MG PO TABS: 650.0000 mg | ORAL_TABLET | Freq: Four times a day (QID) | ORAL | 0 refills | Status: AC | PRN

## 2022-06-24 NOTE — ED Triage Notes (Signed)
Patient presents to [facility] for abdominal pain since 2 months. States now has radiated to pelvic pain. Treating pain with muscle relaxer.

## 2022-06-24 NOTE — Discharge Instructions (Addendum)
Do not use any nonsteroidal anti-inflammatories (NSAIDs) like ibuprofen, Motrin, naproxen, Aleve, etc. which are all available over-the-counter.  Please just use Tylenol at a dose of 650mg  once every 6 hours as needed for your aches, pains, fevers. Talk with your back surgeon to see what they recommend for your ongoing back pain.  Check in with your GI doctor to see if they can schedule your CT scan sooner.

## 2022-06-24 NOTE — ED Provider Notes (Signed)
[facility]  Note:  This document was prepared using Systems analyst and may include unintentional dictation errors.  MRN: [id number] DOB: 03/22/1946  Subjective:   Marissa Roberts is a 77 y.o. female presenting for 1 to 84-month history of persistent generalized belly pain.  Feels like the pain is intermittently radiating into the pelvic pain.  Patient has been using Aleve, ibuprofen daily since having a kyphoplasty in early January.  She has also tried muscle relaxants.  She did have narcotic pain medication left over but has not been using this consistently.  She does take Linzess in case she has constipation.  No bloody stools. No fever, recent antibiotic use, hospitalizations or long distance travel.  Has not eaten raw foods, drank unfiltered water.  No history of GI disorders including Crohn's, IBS, ulcerative colitis.  Her GI doctor was planning to have a CT scan done.  Unfortunately the plan is to get this done in April.  No current facility-administered medications for this encounter.  Current Outpatient Medications:    amLODipine (NORVASC) 10 MG tablet, Take 1 tablet by mouth once daily, Disp: 90 tablet, Rfl: 3   BD PEN NEEDLE NANO U/F 32G X 4 MM MISC, USE 1  4 TIMES DAILY TO  INJECT  INSULIN, Disp: 200 each, Rfl: 0   Biotin 10000 MCG TABS, Take 10,000 mcg by mouth daily., Disp: , Rfl:    Calcium Carb-Cholecalciferol (CALCIUM 600 + D PO), Take 1 tablet by mouth daily., Disp: , Rfl:    Continuous Blood Gluc Receiver (DEXCOM G6 RECEIVER) DEVI, For sensor monitoring, Disp: 1 each, Rfl: 0   Continuous Blood Gluc Sensor (DEXCOM G6 SENSOR) MISC, Use to monitor blood sugar, change after 10 days, Disp: 9 each, Rfl: 3   Continuous Blood Gluc Transmit (DEXCOM G6 TRANSMITTER) MISC, 4 PER YEAR, Disp: 1 each, Rfl: 3   FARXIGA 10 MG TABS tablet, TAKE 1 TABLET BY MOUTH ONCE DAILY BEFORE BREAKFAST (Patient taking differently: Take 5 mg by mouth daily before  breakfast.), Disp: 90 tablet, Rfl: 0   fluticasone (FLONASE) 50 MCG/ACT nasal spray, Place 2 sprays into both nostrils 2 (two) times daily as needed for allergies or rhinitis., Disp: , Rfl:    furosemide (LASIX) 40 MG tablet, Take 2 tablets by mouth once daily (Patient taking differently: Take 40 mg by mouth daily.), Disp: 180 tablet, Rfl: 1   Glucosamine HCl-MSM (GLUCOSAMINE-MSM PO), Take 1 tablet by mouth daily., Disp: , Rfl:    glucose blood (ONETOUCH VERIO) test strip, PLEASE CHECK BLOOD SUGARS AT LEAST HALF THE TIME ABOUT 2 HOURS AFTER ANY MEAL AND 3 TIMES PER DAY ON WAKING UP, Disp: 250 each, Rfl: 0   latanoprost (XALATAN) 0.005 % ophthalmic solution, SMARTSIG:1 Drop(s) In Eye(s) Every Evening, Disp: , Rfl:    leflunomide (ARAVA) 20 MG tablet, Take 1 tablet (20 mg total) by mouth daily., Disp: 90 tablet, Rfl: 0   levothyroxine (SYNTHROID) 112 MCG tablet, TAKE 1 TABLET BY MOUTH BEFORE BREAKFAST, Disp: 30 tablet, Rfl: 0   LINZESS 290 MCG CAPS capsule, TAKE 1 CAPSULE BY MOUTH ONCE DAILY AS NEEDED, Disp: 90 capsule, Rfl: 0   Magnesium 100 MG CAPS, Take 100 mg by mouth daily., Disp: , Rfl:    metFORMIN (GLUCOPHAGE) 1000 MG tablet, TAKE 1 TABLET BY MOUTH TWICE DAILY WITH A MEAL, Disp: 180 tablet, Rfl: 3   montelukast (SINGULAIR) 10 MG tablet, TAKE 1 TABLET BY MOUTH AT BEDTIME, Disp: 90 tablet, Rfl:  0   NOVOLOG FLEXPEN 100 UNIT/ML FlexPen, INJECT 10 TO 15 UNITS SUBCUTANEOUSLY THREE TIMES DAILY BEFORE MEAL(S), Disp: 15 mL, Rfl: 0   omeprazole (PRILOSEC) 40 MG capsule, Take 40 mg by mouth daily., Disp: , Rfl:    ondansetron (ZOFRAN) 4 MG tablet, Take 1 tablet (4 mg total) by mouth every 8 (eight) hours as needed for nausea or vomiting., Disp: 20 tablet, Rfl: 0   OneTouch Delica Lancets 99991111 MISC, USE TO CHECK BLOOD SUGAR UP TO THREE TIMES A DAY 2 HOURS AFTER A MEAL AND UP TO 3 TIMES A WEEK UPON WAKING UP, Disp: 300 each, Rfl: 0   potassium chloride (KLOR-CON) 10 MEQ tablet, TAKE 1  BY MOUTH ONCE DAILY,  Disp: 180 tablet, Rfl: 1   rosuvastatin (CRESTOR) 40 MG tablet, Take 1 tablet by mouth once daily, Disp: 90 tablet, Rfl: 0   TRESIBA FLEXTOUCH 200 UNIT/ML FlexTouch Pen, INJECT 32 UNITS SUBCUTANEOUSLY IN THE MORNING, Disp: 9 mL, Rfl: 0   valsartan (DIOVAN) 320 MG tablet, Take 1 tablet by mouth once daily, Disp: 90 tablet, Rfl: 0   Allergies  Allergen Reactions   Contrast Media [Iodinated Contrast Media]     Throat swells from ct contra   Adhesive [Tape]     No paper tape, causes blisters   Erythromycin Rash   Penicillins Rash    Did it involve swelling of the face/tongue/throat, SOB, or low BP? No Did it involve sudden or severe rash/hives, skin peeling, or any reaction on the inside of your mouth or nose? No Did you need to seek medical attention at a hospital or doctor's office? No When did it last happen?    Many years If all above answers are "NO", may proceed with cephalosporin use.     Past Medical History:  Diagnosis Date   Anemia    Arthritis    Bronchitis    hx of   Diabetes mellitus    type 2   Family history of adverse reaction to anesthesia    per patient, "daughter threw up after a knee surgery"   GERD (gastroesophageal reflux disease)    Hypercholesteremia    Hypertension    Hypothyroidism    Pneumonia    hx of   PONV (postoperative nausea and vomiting)    Thyroid disease      Past Surgical History:  Procedure Laterality Date   ABDOMINAL HYSTERECTOMY     partial   BREAST SURGERY     lumpectomy left breast   carpel tunnel     COLONOSCOPY  07/2020   COLONOSCOPY W/ POLYPECTOMY     FOOT SURGERY     knee arhtroscopy     KNEE ARTHROPLASTY Left 05/2020   KYPHOPLASTY N/A 04/08/2022   Procedure: KYPHOPLASTY L1;  Surgeon: Melina Schools, MD;  Location: [place];  Service: Orthopedics;  Laterality: N/A;  [facility]   ORIF PATELLA Left 05/23/2020   Procedure: LEFT KNEE POLE PATELLA EXCISION;  Surgeon: [name], MD;  Location: [place];   Service: Orthopedics;  Laterality: Left;   SHOULDER ARTHROSCOPY WITH SUBACROMIAL DECOMPRESSION, ROTATOR CUFF REPAIR AND BICEP TENDON REPAIR Right 05/26/2012   Procedure: Right Shoulder Diagnostic Operative Arthroscopy, Subacromial Decompression, Biceps Tenodesis, Mini Open Rotator Cuff Repair;  Surgeon: [name], MD;  Location: [place];  Service: Orthopedics;  Laterality: Right;  Right Shoulder Diagnostic Operative Arthroscopy, Subacromial Decompression, Biceps Tenodesis, Mini Open Rotator Cuff Repair   TONSILLECTOMY     TOTAL KNEE ARTHROPLASTY  Left 07/01/2019   Procedure: LEFT TOTAL KNEE ARTHROPLASTY;  Surgeon: [name], MD;  Location: [place];  Service: Orthopedics;  Laterality: Left;   UPPER GI ENDOSCOPY  07/2020    Family History  Problem Relation Age of Onset   Diabetes Mother    Heart disease Mother    Hyperlipidemia Mother    Diabetes Father    Diabetes Sister    Hypertension Sister    Heart disease Sister    Dementia Sister    Heart disease Brother    Healthy Daughter    Healthy Daughter    Healthy Daughter    Healthy Son     Social History   Tobacco Use   Smoking status: Former    Types: Cigarettes    Quit date: 04/01/1990    Years since quitting: 32.2    Passive exposure: Never   Smokeless tobacco: Never  Vaping Use   Vaping Use: Never used  Substance Use Topics   Alcohol use: Not Currently    Comment: 06/22/2019: per patient about 20-25 years ago   Drug use: No    ROS   Objective:   Vitals: BP 132/76 (BP Location: Left Arm)   Pulse 83   Temp 98.5 F (36.9 C) (Oral)   Resp 18   SpO2 95%   Physical Exam Constitutional:      General: She is not in acute distress.    Appearance: Normal appearance. She is well-developed. She is not ill-appearing, toxic-appearing or diaphoretic.  HENT:     Head: Normocephalic and atraumatic.     Nose: Nose normal.     Mouth/Throat:     Mouth: Mucous membranes are moist.     Pharynx: Oropharynx is  clear.  Eyes:     General: No scleral icterus.       Right eye: No discharge.        Left eye: No discharge.     Extraocular Movements: Extraocular movements intact.     Conjunctiva/sclera: Conjunctivae normal.  Cardiovascular:     Rate and Rhythm: Normal rate.  Pulmonary:     Effort: Pulmonary effort is normal.  Abdominal:     General: Bowel sounds are normal. There is no distension.     Palpations: Abdomen is soft. There is no mass.     Tenderness: There is generalized abdominal tenderness. There is no right CVA tenderness, left CVA tenderness, guarding or rebound.  Skin:    General: Skin is warm and dry.  Neurological:     General: No focal deficit present.     Mental Status: She is alert and oriented to person, place, and time.  Psychiatric:        Mood and Affect: Mood normal.        Behavior: Behavior normal.        Thought Content: Thought content normal.        Judgment: Judgment normal.     Results for orders placed or performed during the hospital encounter of 06/24/22 (from the past 24 hour(s))  POCT urinalysis dipstick     Status: Abnormal   Collection Time: 06/24/22  5:53 PM  Result Value Ref Range   Color, UA yellow yellow   Clarity, UA clear clear   Glucose, UA >=1,000 (A) negative mg/dL   Bilirubin, UA negative negative   Ketones, POC UA trace (5) (A) negative mg/dL   Spec Grav, UA 1.025 1.010 - 1.025   Blood, UA negative negative   pH, UA 5.0  5.0 - 8.0   Protein Ur, POC =100 (A) negative mg/dL   Urobilinogen, UA 0.2 0.2 or 1.0 E.U./dL   Nitrite, UA Negative Negative   Leukocytes, UA Negative Negative    Assessment and Plan :   PDMP not reviewed this encounter.  1. Generalized abdominal pain   2. Suprapubic pain   3. Chronic low back pain, unspecified back pain laterality, unspecified whether sciatica present     I do not suspect acute abdomen.  However, I emphasized need to hold off on using NSAIDs.  Recommended Tylenol.  Follow-up with the back  specialist for further management regarding her chronic back pain.  Check back with GI specialist to see if they want to schedule the CT scan sooner.  Counseled patient on potential for adverse effects with medications prescribed/recommended today, ER and return-to-clinic precautions discussed, patient verbalized understanding.    Jaynee Eagles, [place] 06/24/22 1843

## 2022-06-25 DIAGNOSIS — R1084 Generalized abdominal pain: Secondary | ICD-10-CM | POA: Diagnosis not present

## 2022-06-25 LAB — URINE CULTURE: Culture: NO GROWTH

## 2022-07-01 ENCOUNTER — Other Ambulatory Visit: Payer: Self-pay | Admitting: Endocrinology

## 2022-07-01 DIAGNOSIS — E063 Autoimmune thyroiditis: Secondary | ICD-10-CM

## 2022-07-03 DIAGNOSIS — R109 Unspecified abdominal pain: Secondary | ICD-10-CM | POA: Diagnosis not present

## 2022-07-03 DIAGNOSIS — R102 Pelvic and perineal pain: Secondary | ICD-10-CM | POA: Diagnosis not present

## 2022-07-05 ENCOUNTER — Other Ambulatory Visit (INDEPENDENT_AMBULATORY_CARE_PROVIDER_SITE_OTHER): Payer: Medicare Other

## 2022-07-05 DIAGNOSIS — I10 Essential (primary) hypertension: Secondary | ICD-10-CM | POA: Diagnosis not present

## 2022-07-05 DIAGNOSIS — Z794 Long term (current) use of insulin: Secondary | ICD-10-CM | POA: Diagnosis not present

## 2022-07-05 DIAGNOSIS — E1165 Type 2 diabetes mellitus with hyperglycemia: Secondary | ICD-10-CM | POA: Diagnosis not present

## 2022-07-05 LAB — BASIC METABOLIC PANEL
BUN: 8 mg/dL (ref 6–23)
CO2: 29 mEq/L (ref 19–32)
Calcium: 9.8 mg/dL (ref 8.4–10.5)
Chloride: 102 mEq/L (ref 96–112)
Creatinine, Ser: 0.7 mg/dL (ref 0.40–1.20)
GFR: 84.94 mL/min (ref >60.00)
Glucose, Bld: 83 mg/dL (ref 70–99)
Potassium: 3.5 mEq/L (ref 3.5–5.1)
Sodium: 140 mEq/L (ref 135–145)

## 2022-07-05 LAB — T4, FREE: Free T4: 1.42 ng/dL (ref 0.60–1.60)

## 2022-07-05 LAB — LIPID PANEL
Cholesterol: 150 mg/dL (ref 0–200)
HDL: 50.7 mg/dL (ref >39.00)
LDL Cholesterol: 72 mg/dL (ref 0–99)
NonHDL: 99.59
Total CHOL/HDL Ratio: 3
Triglycerides: 138 mg/dL (ref 0.0–149.0)
VLDL: 27.6 mg/dL (ref 0.0–40.0)

## 2022-07-05 LAB — MICROALBUMIN / CREATININE URINE RATIO
Creatinine,U: 77.5 mg/dL
Microalb Creat Ratio: 36.1 mg/g — ABNORMAL HIGH (ref 0.0–30.0)
Microalb, Ur: 28 mg/dL — ABNORMAL HIGH (ref 0.0–1.9)

## 2022-07-05 LAB — HEMOGLOBIN A1C: Hgb A1c MFr Bld: 5.5 % (ref 4.6–6.5)

## 2022-07-05 LAB — TSH: TSH: 0.98 u[IU]/mL (ref 0.35–5.50)

## 2022-07-07 DIAGNOSIS — E1165 Type 2 diabetes mellitus with hyperglycemia: Secondary | ICD-10-CM | POA: Diagnosis not present

## 2022-07-08 DIAGNOSIS — R1084 Generalized abdominal pain: Secondary | ICD-10-CM | POA: Diagnosis not present

## 2022-07-08 DIAGNOSIS — K581 Irritable bowel syndrome with constipation: Secondary | ICD-10-CM | POA: Diagnosis not present

## 2022-07-09 ENCOUNTER — Encounter: Payer: Self-pay | Admitting: Endocrinology

## 2022-07-09 ENCOUNTER — Ambulatory Visit (INDEPENDENT_AMBULATORY_CARE_PROVIDER_SITE_OTHER): Payer: Medicare Other | Admitting: Endocrinology

## 2022-07-09 VITALS — BP 126/80 | HR 81 | Wt 182.0 lb

## 2022-07-09 DIAGNOSIS — E1129 Type 2 diabetes mellitus with other diabetic kidney complication: Secondary | ICD-10-CM

## 2022-07-09 DIAGNOSIS — I10 Essential (primary) hypertension: Secondary | ICD-10-CM

## 2022-07-09 DIAGNOSIS — E1169 Type 2 diabetes mellitus with other specified complication: Secondary | ICD-10-CM

## 2022-07-09 DIAGNOSIS — E063 Autoimmune thyroiditis: Secondary | ICD-10-CM | POA: Diagnosis not present

## 2022-07-09 DIAGNOSIS — R809 Proteinuria, unspecified: Secondary | ICD-10-CM

## 2022-07-09 DIAGNOSIS — E118 Type 2 diabetes mellitus with unspecified complications: Secondary | ICD-10-CM

## 2022-07-09 DIAGNOSIS — E785 Hyperlipidemia, unspecified: Secondary | ICD-10-CM

## 2022-07-09 MED ORDER — LEVOTHYROXINE SODIUM 112 MCG PO TABS: ORAL_TABLET | ORAL | 1 refills | Status: DC

## 2022-07-09 MED ORDER — DAPAGLIFLOZIN PROPANEDIOL 5 MG PO TABS
5.0000 mg | ORAL_TABLET | Freq: Every day | ORAL | 3 refills | Status: DC
Start: 2022-07-09 — End: 2022-10-30

## 2022-07-09 NOTE — Patient Instructions (Addendum)
Tresiba 30 units  Novolog for larger meals or more starchy foods

## 2022-07-09 NOTE — Progress Notes (Signed)
Patient ID: Marissa Roberts, female   DOB: 1946-01-08, 77 y.o.   MRN: [id number]               Reason for Appointment:  Follow-up for endocrine problems   History of Present Illness:             Type 2 diabetes mellitus, date of diagnosis: 2005       INSULIN regimen is  Tresiba 34 units am, Novolog rarely  Oral hypoglycemic drugs the patient is taking are: Metformin 2 g daily, Farxiga 10 mg, half daily   Current blood sugar patterns, management and problems identified:  Her A1c is 5.5, previously 6.6  Apparently had anemia recently also  She recently has fairly good blood sugars with some sporadically high postprandial readings  However she was recommended 32 units of Tresiba she is still taking 34 Clearly send I be alerted with a slightly low blood sugar overnight For this reason she only takes 500 mg metformin in the evening instead of 1000 However does not appear to be taking NovoLog unless the blood sugar has already gone up Cannot pinpoint what kind of foods make her blood sugar go up Exelon Corporationolerating Farxiga well and wants to use the 5 mg tablet now  Dinner usually at 6-7pm  Side effects from medications have been:none  [facility] for the last 2 weeks through 12/6 as follows  Overnight blood sugars are mostly low normal and generally stable with only rare blood sugars below 70 Average blood sugar through the night is usually between 97 and 104  Premeal blood sugars are overall slightly high at dinnertime However postprandial readings are an average not significantly higher compared to Premeal readings except being higher periodically after lunch Blood sugars are not as high in the last week except sporadically mid afternoon or after dinner  No hypoglycemia during the day   CGM use % of time   2-week average/GV 119  Time in range        93%  % Time Above 180 6  % Time above 250   % Time Below 70 1     Previously:   CGM use % of time 93   2-week average/GV 119  Time in range      93  %  % Time Above 180 3  % Time above 250   % Time Below 70      PRE-MEAL Fasting Lunch Dinner Bedtime Overall  Glucose range:       Averages: 110        Self-care:   Meals: 3 meals per day. Breakfast is usually grits with or without eggs or meat Not always restricting fat intake or sweet tea  when eating out      Dietician visit, most recent:2014.               Weight history:  Wt Readings from Last 3 Encounters:  07/09/22 182 lb (82.6 kg)  04/08/22 196 lb (88.9 kg)  03/29/22 201 lb (91.2 kg)    Glycemic control:   Lab Results  Component Value Date   HGBA1C 5.5 07/05/2022   HGBA1C 6.6 (H) 03/05/2022   HGBA1C 6.1 10/29/2021   Lab Results  Component Value Date   MICROALBUR 28.0 (H) 07/05/2022   LDLCALC 72 07/05/2022   CREATININE 0.70 07/05/2022   Lab Results  Component Value Date   FRUCTOSAMINE 194 07/30/2021    Lab Results  Component Value Date  HGB 11.6 (L) 04/08/2022     Past history:  She thinks her blood sugars were only mildly increased at borderline levels at the onsetper Most likely she was treated with metformin initially and this has been continued.   At some point she was also tried on Kombiglyze and Byetta for improving her control. She is not sure why she was started on insulin 5 years ago, presumably for worsening hyperglycemia Also not clear if she has tried various insulin regimens before starting Lantus and NovoLog  Previously had tried the V-go pump but subsequently she stopped doing the V-go pump because of the cost, skin irritation and local discomfort   Allergies as of 07/09/2022       Reactions   Contrast Media [iodinated Contrast Media]    Throat swells from ct contra   Adhesive [tape]    No paper tape, causes blisters   Erythromycin Rash   Penicillins Rash   Did it involve swelling of the face/tongue/throat, SOB, or low BP? No Did it involve sudden or severe rash/hives, skin  peeling, or any reaction on the inside of your mouth or nose? No Did you need to seek medical attention at a hospital or doctor's office? No When did it last happen?    Many years If all above answers are "NO", may proceed with cephalosporin use.        Medication List        Accurate as of July 09, 2022 10:45 AM. If you have any questions, ask your nurse or doctor.          acetaminophen 325 MG tablet Commonly known as: Tylenol Take 2 tablets (650 mg total) by mouth every 6 (six) hours as needed for moderate pain.   amLODipine 10 MG tablet Commonly known as: NORVASC Take 1 tablet by mouth once daily   BD Pen Needle Nano U/F 32G X 4 MM Misc Generic drug: Insulin Pen Needle USE 1  4 TIMES DAILY TO  INJECT  INSULIN   Biotin 08144 MCG Tabs Take 10,000 mcg by mouth daily.   CALCIUM 600 + D PO Take 1 tablet by mouth daily.   Dexcom G6 Receiver Devi For sensor monitoring   Dexcom G6 Sensor Misc Use to monitor blood sugar, change after 10 days   Dexcom G6 Transmitter Misc 4 PER YEAR   Farxiga 10 MG Tabs tablet Generic drug: dapagliflozin propanediol TAKE 1 TABLET BY MOUTH ONCE DAILY BEFORE BREAKFAST What changed: how much to take   fluticasone 50 MCG/ACT nasal spray Commonly known as: FLONASE Place 2 sprays into both nostrils 2 (two) times daily as needed for allergies or rhinitis.   furosemide 40 MG tablet Commonly known as: LASIX Take 2 tablets by mouth once daily What changed: how much to take   GLUCOSAMINE-MSM PO Take 1 tablet by mouth daily.   latanoprost 0.005 % ophthalmic solution Commonly known as: XALATAN SMARTSIG:1 Drop(s) In Eye(s) Every Evening   leflunomide 20 MG tablet Commonly known as: ARAVA Take 1 tablet (20 mg total) by mouth daily.   levothyroxine 112 MCG tablet Commonly known as: SYNTHROID TAKE 1 TABLET BY MOUTH BEFORE BREAKFAST   Linzess 290 MCG Caps capsule Generic drug: linaclotide TAKE 1 CAPSULE BY MOUTH ONCE DAILY AS  NEEDED   Magnesium 100 MG Caps Take 100 mg by mouth daily.   metFORMIN 1000 MG tablet Commonly known as: GLUCOPHAGE TAKE 1 TABLET BY MOUTH TWICE DAILY WITH A MEAL   montelukast 10 MG tablet Commonly  known as: SINGULAIR TAKE 1 TABLET BY MOUTH AT BEDTIME   NovoLOG FlexPen 100 UNIT/ML FlexPen Generic drug: insulin aspart INJECT 10 TO 15 UNITS SUBCUTANEOUSLY THREE TIMES DAILY BEFORE MEAL(S)   omeprazole 40 MG capsule Commonly known as: PRILOSEC Take 40 mg by mouth daily.   ondansetron 4 MG tablet Commonly known as: Zofran Take 1 tablet (4 mg total) by mouth every 8 (eight) hours as needed for nausea or vomiting.   OneTouch Delica Lancets 33G Misc USE TO CHECK BLOOD SUGAR UP TO THREE TIMES A DAY 2 HOURS AFTER A MEAL AND UP TO 3 TIMES A WEEK UPON WAKING UP   OneTouch Verio test strip Generic drug: glucose blood PLEASE CHECK BLOOD SUGARS AT LEAST HALF THE TIME ABOUT 2 HOURS AFTER ANY MEAL AND 3 TIMES PER DAY ON WAKING UP   potassium chloride 10 MEQ tablet Commonly known as: KLOR-CON TAKE 1  BY MOUTH ONCE DAILY   rosuvastatin 40 MG tablet Commonly known as: CRESTOR Take 1 tablet by mouth once daily   Tresiba FlexTouch 200 UNIT/ML FlexTouch Pen Generic drug: insulin degludec INJECT 32 UNITS SUBCUTANEOUSLY IN THE MORNING   valsartan 320 MG tablet Commonly known as: DIOVAN Take 1 tablet by mouth once daily        Allergies:  Allergies  Allergen Reactions   Contrast Media [Iodinated Contrast Media]     Throat swells from ct contra   Adhesive [Tape]     No paper tape, causes blisters   Erythromycin Rash   Penicillins Rash    Did it involve swelling of the face/tongue/throat, SOB, or low BP? No Did it involve sudden or severe rash/hives, skin peeling, or any reaction on the inside of your mouth or nose? No Did you need to seek medical attention at a hospital or doctor's office? No When did it last happen?    Many years If all above answers are "NO", may proceed with  cephalosporin use.     Past Medical History:  Diagnosis Date   Anemia    Arthritis    Bronchitis    hx of   Diabetes mellitus    type 2   Family history of adverse reaction to anesthesia    per patient, "daughter threw up after a knee surgery"   GERD (gastroesophageal reflux disease)    Hypercholesteremia    Hypertension    Hypothyroidism    Pneumonia    hx of   PONV (postoperative nausea and vomiting)    Thyroid disease     Past Surgical History:  Procedure Laterality Date   ABDOMINAL HYSTERECTOMY     partial   BREAST SURGERY     lumpectomy left breast   carpel tunnel     COLONOSCOPY  07/2020   COLONOSCOPY W/ POLYPECTOMY     FOOT SURGERY     knee arhtroscopy     KNEE ARTHROPLASTY Left 05/2020   KYPHOPLASTY N/A 04/08/2022   Procedure: KYPHOPLASTY L1;  Surgeon: Venita Lick, MD;  Location: [facility];  Service: Orthopedics;  Laterality: N/A;  [facility]   ORIF PATELLA Left 05/23/2020   Procedure: LEFT KNEE POLE PATELLA EXCISION;  Surgeon: Cammy Copa, MD;  Location: [facility];  Service: Orthopedics;  Laterality: Left;   SHOULDER ARTHROSCOPY WITH SUBACROMIAL DECOMPRESSION, ROTATOR CUFF REPAIR AND BICEP TENDON REPAIR Right 05/26/2012   Procedure: Right Shoulder Diagnostic Operative Arthroscopy, Subacromial Decompression, Biceps Tenodesis, Mini Open Rotator Cuff Repair;  Surgeon: Cammy Copa, MD;  Location: [facility];  Service: Orthopedics;  Laterality: Right;  Right Shoulder Diagnostic Operative Arthroscopy, Subacromial Decompression, Biceps Tenodesis, Mini Open Rotator Cuff Repair   TONSILLECTOMY     TOTAL KNEE ARTHROPLASTY Left 07/01/2019   Procedure: LEFT TOTAL KNEE ARTHROPLASTY;  Surgeon: Cammy Copa, MD;  Location: [facility] OR;  Service: Orthopedics;  Laterality: Left;   UPPER GI ENDOSCOPY  07/2020    Family History  Problem Relation Age of Onset   Diabetes Mother    Heart disease Mother    Hyperlipidemia Mother    Diabetes Father    Diabetes  Sister    Hypertension Sister    Heart disease Sister    Dementia Sister    Heart disease Brother    Healthy Daughter    Healthy Daughter    Healthy Daughter    Healthy Son     Social History:  reports that she quit smoking about 32 years ago. Her smoking use included cigarettes. She has never been exposed to tobacco smoke. She has never used smokeless tobacco. She reports that she does not currently use alcohol. She reports that she does not use drugs.    Review of Systems   HYPERTENSION: She is on amlodipine 10 mg once daily and Diovan 3 20 mg  Was tried on diltiazem instead of amlodipine before but she felt that this was causing hair loss and constipation and is back on amlodipine  Also HCTZ had caused hyponatremia Also has a blood pressure monitor at home   BP Readings from Last 3 Encounters:  07/09/22 126/80  06/24/22 132/76  04/08/22 (!) 122/54    On Lasix 40 mg for edema  NEPHROPATHY: Minimal increase in microalbumin  LIPIDS:  She is taking rosuvastatin 40 mg, previously LDL was high with simvastatin 20 mg LDL below 100 Crestor prescribed by PCP      Lab Results  Component Value Date   CHOL 150 07/05/2022   HDL 50.70 07/05/2022   LDLCALC 72 07/05/2022   LDLDIRECT 62.0 05/18/2021   TRIG 138.0 07/05/2022   CHOLHDL 3 07/05/2022                  Thyroid:      She has been hypothyroid for over 40 years  The dose has been adjusted periodically She takes levothyroxine before breakfast daily   TSH has been consistent within the normal range with taking 112 mcg  Last labs:   Lab Results  Component Value Date   TSH 0.98 07/05/2022   TSH 1.96 10/29/2021   TSH 0.75 09/27/2021   FREET4 1.42 07/05/2022   FREET4 1.12 10/29/2021   FREET4 1.01 01/18/2021    She is followed periodically by podiatrist  Physical Examination:  BP 126/80 (BP Location: Left Arm, Patient Position: Sitting, Cuff Size: Large)   Pulse 81   Wt 182 lb (82.6 kg)   SpO2 99%   BMI  30.29 kg/m    ASSESSMENT/PLAN:  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:  See history of present illness for detailed discussion of current diabetes management, blood sugar patterns and problems identified  She is currently on basal insulin, Farxiga 5 mg and metformin  A1c is fairly good at 6.6  Blood sugars on her sensor download recently are 97% within target range and averaging only 110 Only rarely will have high postprandial readings if she is drinking sweet tea or getting more carbs Weight has leveled off As above may have low normal readings overnight  Recommendations:   She will reduce Tresiba insulin  back to 30 units She can increase her metformin to 1000 mg consistently in the evening Discussed needing to adjust her metformin less and the insulin more frequently with blood sugar targets at different times being discussed also Since she is benefiting adequately from 5 mg Marcelline Deist will continue this for now Discussed needing to use 5 to 10 units of NovoLog if eating larger meals with much carbohydrate  HYPERTENSION with microalbuminuria: Blood pressure is excellent  Microalbumin minimally increased  Hypothyroidism: To continue same dose of 112 mcg with normal TSH  Lipids: Adequately controlled with rosuvastatin 40 mg   There are no Patient Instructions on file for this visit.   Reather Littler 07/09/2022, 10:45 AM   Note: This office note was prepared with Dragon voice recognition system technology. Any transcriptional errors that result from this process are unintentional.

## 2022-07-10 ENCOUNTER — Encounter: Payer: Self-pay | Admitting: Endocrinology

## 2022-07-12 ENCOUNTER — Ambulatory Visit (INDEPENDENT_AMBULATORY_CARE_PROVIDER_SITE_OTHER): Payer: Medicare Other | Admitting: Internal Medicine

## 2022-07-12 ENCOUNTER — Encounter: Payer: Self-pay | Admitting: Internal Medicine

## 2022-07-12 VITALS — BP 134/88 | HR 74 | Temp 98.3°F | Ht 65.0 in | Wt 181.0 lb

## 2022-07-12 DIAGNOSIS — M810 Age-related osteoporosis without current pathological fracture: Secondary | ICD-10-CM | POA: Diagnosis not present

## 2022-07-12 DIAGNOSIS — S32000S Wedge compression fracture of unspecified lumbar vertebra, sequela: Secondary | ICD-10-CM | POA: Diagnosis not present

## 2022-07-12 DIAGNOSIS — E538 Deficiency of other specified B group vitamins: Secondary | ICD-10-CM

## 2022-07-12 DIAGNOSIS — R5383 Other fatigue: Secondary | ICD-10-CM

## 2022-07-12 DIAGNOSIS — D649 Anemia, unspecified: Secondary | ICD-10-CM

## 2022-07-12 LAB — CBC
HCT: 35.2 % — ABNORMAL LOW (ref 36.0–46.0)
Hemoglobin: 11.3 g/dL — ABNORMAL LOW (ref 12.0–15.0)
MCHC: 32.1 g/dL (ref 30.0–36.0)
MCV: 86.3 fl (ref 78.0–100.0)
Platelets: 309 10*3/uL (ref 150.0–400.0)
RBC: 4.08 Mil/uL (ref 3.87–5.11)
RDW: 16.8 % — ABNORMAL HIGH (ref 11.5–15.5)
WBC: 11.1 10*3/uL — ABNORMAL HIGH (ref 4.0–10.5)

## 2022-07-12 LAB — FERRITIN: Ferritin: 157.9 ng/mL (ref 10.0–291.0)

## 2022-07-12 LAB — VITAMIN B12: Vitamin B-12: >1500 pg/mL — ABNORMAL HIGH (ref 211–911)

## 2022-07-12 LAB — VITAMIN D 25 HYDROXY (VIT D DEFICIENCY, FRACTURES): VITD: 41.55 ng/mL (ref 30.00–100.00)

## 2022-07-12 MED ORDER — CYANOCOBALAMIN 1000 MCG/ML IJ SOLN
1000.0000 ug | Freq: Once | INTRAMUSCULAR | Status: AC
Start: 2022-07-12 — End: 2022-07-12
  Administered 2022-07-12: 1000 ug via INTRAMUSCULAR

## 2022-07-12 NOTE — Progress Notes (Signed)
   Subjective:   Patient ID: Marissa Roberts, female    DOB: 06/12/1945, 77 y.o.   MRN: [id number]  Anemia Symptoms include abdominal pain. There has been no palpitations.   The patient is coming in for fatigue and stomach pain and feeling poorly.   Review of Systems  Constitutional: Negative.   HENT: Negative.    Eyes: Negative.   Respiratory:  Negative for cough, chest tightness and shortness of breath.   Cardiovascular:  Negative for chest pain, palpitations and leg swelling.  Gastrointestinal:  Positive for abdominal pain. Negative for abdominal distention, constipation, diarrhea, nausea and vomiting.  Musculoskeletal:  Positive for back pain.  Skin: Negative.   Neurological: Negative.   Psychiatric/Behavioral: Negative.      Objective:  Physical Exam Constitutional:      Appearance: She is well-developed.  HENT:     Head: Normocephalic and atraumatic.  Cardiovascular:     Rate and Rhythm: Normal rate and regular rhythm.  Pulmonary:     Effort: Pulmonary effort is normal. No respiratory distress.     Breath sounds: Normal breath sounds. No wheezing or rales.  Abdominal:     General: Bowel sounds are normal. There is no distension.     Palpations: Abdomen is soft.     Tenderness: There is abdominal tenderness. There is no rebound.     Comments: Tenderness lower stomach across the stomach and back pain as well  Musculoskeletal:        General: Tenderness present.     Cervical back: Normal range of motion.  Skin:    General: Skin is warm and dry.  Neurological:     Mental Status: She is alert and oriented to person, place, and time.     Coordination: Coordination normal.     Vitals:   07/12/22 1112  BP: 134/88  Pulse: 74  Temp: 98.3 F (36.8 C)  TempSrc: Oral  SpO2: 94%  Weight: 181 lb (82.1 kg)  Height: 5\' 5"  (1.651 m)    Assessment & Plan:  B12 given at visit

## 2022-07-12 NOTE — Assessment & Plan Note (Signed)
I suspect she is having radicular symptoms around the low abdomen. She states CT scan negative and ob/gyn evaluation negative (I cannot review records). She had U/A and culture negative within 2 weeks. Asked her to call neurosurgery to see if they would try an epidural.

## 2022-07-12 NOTE — Assessment & Plan Note (Signed)
With recent compression fracture she should have treatment. We will discuss options at next visit we were focused on current pain and labs today.

## 2022-07-12 NOTE — Assessment & Plan Note (Signed)
Checked B12 labs, then gave B12 injection to help with energy. She has been taking otc and feels it is not helping well.

## 2022-07-12 NOTE — Assessment & Plan Note (Signed)
Recent thyroid levels normal and CMP stable. HgA1c at goal. Checking CBC, vitamin D and B12 and ferritin.

## 2022-07-12 NOTE — Patient Instructions (Signed)
We will check the labs today.  Call the back doctor to see if they could do an epidural shot in your back to see if this helps your stomach.

## 2022-07-12 NOTE — Assessment & Plan Note (Signed)
Very mild anemia on prior labs 04/2022. Checking CBC, ferritin, B12, vitamin D.

## 2022-07-15 ENCOUNTER — Encounter: Payer: Medicare Other | Attending: Endocrinology | Admitting: Nutrition

## 2022-07-15 DIAGNOSIS — E118 Type 2 diabetes mellitus with unspecified complications: Secondary | ICD-10-CM | POA: Insufficient documentation

## 2022-07-15 NOTE — Progress Notes (Signed)
Patient was trained on the use of the Dexcom G7 sensors.  The sensor was inserted into her left outer arm.  She could not remember her user name and password to the [facility] and clarity account from her G6, so the readings are going to her reader.  We downloaded the G7 app to her phone, but her daughter will set up the account and she can start the readings going to her phone at her next sensor start.  Written instructions given for this, so that she can give them to her daughter.  She has my number if she has questions.

## 2022-07-15 NOTE — Patient Instructions (Addendum)
Set up dexcom G7 App on phone,  so that readings can go to phone. Once the app is on your phone, change sensor using the app.   Change sensor every 10 days.   Call Dexcom help line if questions

## 2022-07-17 DIAGNOSIS — H401131 Primary open-angle glaucoma, bilateral, mild stage: Secondary | ICD-10-CM | POA: Diagnosis not present

## 2022-07-21 ENCOUNTER — Other Ambulatory Visit: Payer: Self-pay | Admitting: Endocrinology

## 2022-07-22 ENCOUNTER — Ambulatory Visit: Payer: Medicare Other | Admitting: Nutrition

## 2022-07-22 ENCOUNTER — Other Ambulatory Visit: Payer: Self-pay | Admitting: Rheumatology

## 2022-07-22 NOTE — Progress Notes (Unsigned)
Office Visit Note  Patient: Marissa Roberts             Date of Birth: [birth year]           MRN: [id number]             PCP: Myrlene Broker, MD Referring: Myrlene Broker, * Visit Date: 07/23/2022 Occupation: @GUAROCC @  Subjective:  Medication monitoring   History of Present Illness: Marissa Roberts is a 77 y.o. female with history of seropositive rheumatoid arthritis and osteoarthritis. She is taking arava 20 mg 1 tablet by mouth daily. She is tolerating arava without any side effects.  She denies any signs or symptoms of a rheumatoid arthritis flare.  She continues to experience intermittent pain in her hands but denies any joint swelling.  She uses Biofreeze or diclofenac gel as needed which provides significant relief.  She denies any other joint pain or joint swelling at this time.  Patient reports that she had an L1 kyphoplasty performed in January 2024.  She states that her gynecologist has been ordering her bone densities over the years and she believes she is up-to-date with her bone density.  She is not currently taking any treatment for osteoporosis.  She is using a cane to assist with ambulation.  She continues to have chronic pain and some instability in the left knee replacement which is what led to the fall where she sustained the L1 compression fracture.    Activities of Daily Living:  Patient reports morning stiffness for 0 minutes.   Patient Denies nocturnal pain.  Difficulty dressing/grooming: Denies Difficulty climbing stairs: Denies Difficulty getting out of chair: Denies Difficulty using hands for taps, buttons, cutlery, and/or writing: Reports  Review of Systems  Constitutional:  Positive for fatigue.  HENT:  Negative for mouth sores and mouth dryness.   Eyes:  Negative for dryness.  Respiratory:  Negative for shortness of breath.   Cardiovascular:  Negative for chest pain and palpitations.  Gastrointestinal:  Negative for blood in stool,  constipation and diarrhea.  Endocrine: Negative for increased urination.  Genitourinary:  Negative for involuntary urination.  Musculoskeletal:  Negative for joint pain, gait problem, joint pain, joint swelling, myalgias, muscle weakness, morning stiffness, muscle tenderness and myalgias.  Skin:  Negative for color change, rash, hair loss and sensitivity to sunlight.  Allergic/Immunologic: Negative for susceptible to infections.  Neurological:  Negative for dizziness and headaches.  Hematological:  Negative for swollen glands.  Psychiatric/Behavioral:  Negative for depressed mood and sleep disturbance. The patient is not nervous/anxious.     PMFS History:  Patient Active Problem List   Diagnosis Date Noted   Lumbar compression fracture 03/29/2022   Anxiety 02/25/2022   Low back pain 02/25/2022   Generalized subcutaneous nodules 11/22/2021   Displaced transverse fracture of left patella, subsequent encounter for closed fracture with delayed healing    Pain in knee region after total knee replacement 03/22/2020   Arthritis of left knee 07/01/2019   Arthritis 04/20/2019   B12 deficiency 03/23/2019   Anemia 02/05/2019   Rheumatoid arthritis with rheumatoid factor of multiple sites without organ or systems involvement 01/25/2019   High risk medication use 01/25/2019   Fatigue 09/18/2018   Encounter for general adult medical examination with abnormal findings 02/07/2015   Hyperlipidemia associated with type 2 diabetes mellitus 07/28/2009   Osteoporosis 03/14/2009   Diabetes mellitus type 2 with complications 11/14/2006   Hypothyroidism 09/24/2006   Essential hypertension 09/24/2006    Past Medical  History:  Diagnosis Date   Anemia    Arthritis    Bronchitis    hx of   Diabetes mellitus    type 2   Family history of adverse reaction to anesthesia    per patient, "daughter threw up after a knee surgery"   GERD (gastroesophageal reflux disease)    Hypercholesteremia     Hypertension    Hypothyroidism    Pneumonia    hx of   PONV (postoperative nausea and vomiting)    Thyroid disease     Family History  Problem Relation Age of Onset   Diabetes Mother    Heart disease Mother    Hyperlipidemia Mother    Diabetes Father    Diabetes Sister    Hypertension Sister    Heart disease Sister    Dementia Sister    Heart disease Brother    Healthy Daughter    Healthy Daughter    Healthy Daughter    Healthy Son    Past Surgical History:  Procedure Laterality Date   ABDOMINAL HYSTERECTOMY     partial   BREAST SURGERY     lumpectomy left breast   carpel tunnel     COLONOSCOPY  07/2020   COLONOSCOPY W/ POLYPECTOMY     FOOT SURGERY     knee arhtroscopy     KNEE ARTHROPLASTY Left 05/2020   KYPHOPLASTY N/A 04/08/2022   Procedure: KYPHOPLASTY L1;  Surgeon: Venita Lick, MD;  Location: [facility] OR;  Service: Orthopedics;  Laterality: N/A;  [facility]   ORIF PATELLA Left 05/23/2020   Procedure: LEFT KNEE POLE PATELLA EXCISION;  Surgeon: Cammy Copa, MD;  Location: [facility] OR;  Service: Orthopedics;  Laterality: Left;   SHOULDER ARTHROSCOPY WITH SUBACROMIAL DECOMPRESSION, ROTATOR CUFF REPAIR AND BICEP TENDON REPAIR Right 05/26/2012   Procedure: Right Shoulder Diagnostic Operative Arthroscopy, Subacromial Decompression, Biceps Tenodesis, Mini Open Rotator Cuff Repair;  Surgeon: Cammy Copa, MD;  Location: [facility] OR;  Service: Orthopedics;  Laterality: Right;  Right Shoulder Diagnostic Operative Arthroscopy, Subacromial Decompression, Biceps Tenodesis, Mini Open Rotator Cuff Repair   TONSILLECTOMY     TOTAL KNEE ARTHROPLASTY Left 07/01/2019   Procedure: LEFT TOTAL KNEE ARTHROPLASTY;  Surgeon: Cammy Copa, MD;  Location: [facility] OR;  Service: Orthopedics;  Laterality: Left;   UPPER GI ENDOSCOPY  07/2020   Social History   Social History Narrative   Not on file   Immunization History  Administered Date(s) Administered   COVID-19, mRNA,  vaccine(Comirnaty)12 years and older 01/08/2022   Fluad Quad(high Dose 65+) 01/02/2019, 12/31/2019   Influenza Split 01/03/2011   Influenza Whole 04/01/2005, 12/18/2009   Influenza, High Dose Seasonal PF 01/07/2014, 01/27/2015, 01/02/2016, 01/13/2017, 01/16/2018   Influenza-Unspecified 11/30/2012, 01/03/2021, 01/08/2022   PFIZER(Purple Top)SARS-COV-2 Vaccination 05/28/2019, 06/23/2019, 01/15/2020, 07/03/2020, 01/03/2021   Pneumococcal Conjugate-13 09/12/2016, 01/02/2019   Pneumococcal Polysaccharide-23 07/15/2014, 12/27/2019   Respiratory Syncytial Virus Vaccine,Recomb Aduvanted(Arexvy) 01/08/2022   Td 04/01/2001, 10/07/2013   Zoster Recombinat (Shingrix) 01/09/2021, 03/12/2021     Objective: Vital Signs: BP (!) 153/82 (BP Location: Right Arm, Patient Position: Sitting, Cuff Size: Large)   Pulse 77   Resp 14   Ht  (1.651 m)   Wt 178 lb 6.4 oz (80.9 kg)   BMI 29.69 kg/m    Physical Exam Vitals and nursing note reviewed.  Constitutional:      Appearance: She is well-developed.  HENT:     Head: Normocephalic and atraumatic.  Eyes:     Conjunctiva/sclera: Conjunctivae  normal.  Cardiovascular:     Rate and Rhythm: Normal rate and regular rhythm.     Heart sounds: Normal heart sounds.  Pulmonary:     Effort: Pulmonary effort is normal.     Breath sounds: Normal breath sounds.  Abdominal:     General: Bowel sounds are normal.     Palpations: Abdomen is soft.  Musculoskeletal:     Cervical back: Normal range of motion.  Lymphadenopathy:     Cervical: No cervical adenopathy.  Skin:    General: Skin is warm and dry.     Capillary Refill: Capillary refill takes less than 2 seconds.  Neurological:     Mental Status: She is alert and oriented to person, place, and time.  Psychiatric:        Behavior: Behavior normal.      Musculoskeletal Exam: C-spine has good ROM.  Shoulder joints, elbow joints, wrist joints, Mcps, PIPs, and DIPs good ROM with no synovitis.  Complete  fist formation bilaterally. Hip joints have good ROM.  Left knee replacement has good ROM.  Right knee joint has good ROM. No tenderness or swelling of ankle joints.   CDAI Exam: CDAI Score: -- Patient Global: 2 mm; Provider Global: 2 mm Swollen: --; Tender: -- Joint Exam 07/23/2022   No joint exam has been documented for this visit   There is currently no information documented on the homunculus. Go to the Rheumatology activity and complete the homunculus joint exam.  Investigation: No additional findings.  Imaging: No results found.  Recent Labs: Lab Results  Component Value Date   WBC 11.1 (H) 07/12/2022   HGB 11.3 (L) 07/12/2022   PLT 309.0 07/12/2022   NA 140 07/05/2022   K 3.5 07/05/2022   CL 102 07/05/2022   CO2 29 07/05/2022   GLUCOSE 83 07/05/2022   BUN 8 07/05/2022   CREATININE 0.70 07/05/2022   BILITOT 0.2 02/20/2022   ALKPHOS 45 07/30/2021   AST 14 02/20/2022   ALT 12 02/20/2022   PROT 7.0 02/20/2022   ALBUMIN 4.0 07/30/2021   CALCIUM 9.8 07/05/2022   GFRAA 101 09/18/2020   QFTBGOLDPLUS NEGATIVE 01/09/2021    Speciality Comments: PLQ Eye Exam: 12/22/2019 WNL @ [facility] follow up in 6 months  Methotrexate-hair loss  Procedures:  No procedures performed Allergies: Contrast media [iodinated contrast media], Adhesive [tape], Erythromycin, and Penicillins   Gynecologist ordering DEXA  L1 Kyphoplasty 04/08/22     Assessment / Plan:     Visit Diagnoses: Rheumatoid arthritis involving multiple sites with positive rheumatoid factor: She has no synovitis on examination today.  She has not had any signs or symptoms of a rheumatoid arthritis flare.  She is taking Arava 20 mg 1 tablet by mouth daily.  She is tolerating Arava without any side effects and has not missed any doses recently.  She experiences occasional discomfort in her hands but has not noticed any inflammation.  She will remain on Arava as monotherapy.  She was advised to notify us if she  develops signs or symptoms of a flare.  She will follow-up in the office in 5 months or sooner if needed.   High risk medication use -Arava 20 mg 1 tablet by mouth daily.  CBC updated on 07/12/22. BMP updated on 07/05/22.  No recent or recurrent infections.  Discussed the importance of holding arava if she develops signs or symptoms of an infection and to resume once the infection has completely cleared   Trigger finger, left  middle finger - Resolved with no recurrence.  Primary osteoarthritis of both hands: PIP and DIP thickening.  No inflammation noted. Complete fist formation bilaterally.   Trochanteric bursitis, left hip: Resolved.   S/P total knee replacement, left - Performed by Dr. August Saucer on 07/01/19.  Bone scan of both lower extremity performed on 11/30/2020.  She continues to have chronic pain and some instability.  She is using a cane to assist with ambulation.  Primary osteoarthritis of both feet: She is not experiencing any increased discomfort in her feet at this time.  She has good range of motion of both ankle joints with no tenderness or inflammation.  History of osteopenia -No DEXA in epic to review.  According to the patient her gynecologist, Dr. Henderson Cloud, has been ordering her bone densities every 2 years.  We will call to obtain the most recent bone density to review.  Patient had a vertebral L1 compression fracture in January 2024 after a fall.  She underwent a L1 Kyphoplasty on 04/08/2022.  She is taking a calcium and vitamin D supplement daily.  Discussed treatment options for management of osteoporosis today in detail.  Recommended applying for Forteo daily injections through her insurance.  Indications, contraindications, and potential side effects of Forteo were discussed today in detail.  All questions were addressed.  The following lab work will be obtained today prior to initiating therapy.  Plan to review most recent DEXA once available. - Plan: COMPLETE METABOLIC PANEL WITH GFR,  Parathyroid hormone, intact (no Ca), VITAMIN D 25 Hydroxy (Vit-D Deficiency, Fractures), Serum protein electrophoresis with reflex  Counseled patient on purpose, proper use, storage, and adverse effects of Forteo. Discussed the Forteo is PTH peptide agonist which results in stimulation of osteoblast and bone mass. Reviewed that Forteo treatment course is for 2 years. Discussed that pen is stable for 28 days after first use and must be stored in fridge after each dose. Counseled patient that Sharren Bridge is a medication that must be injected once daily and that prescription for pen needles will be sent with Forteo prescription.  Advised patient to continue taking calcium 1200 mg daily and vitamin D supplement.  Reviewed the most common adverse effects of Forteo including orthostatic hypotension, GI upset, injection site reaction, and joint pain/arthralgia.  Discussed injection site reaction management and injection site locations. Discussed alternating injection site.  Discussed management of dizziness (which can occur for up to 4 hours after injection) including adequate hydration, slowly rising from bed/chairs to prevent fals, and taking Forteo at night if needed.  We walked through [employer] administration with demo pen and pen needle.  Discussed appropriate disposal of sharps.     Forteo prescription pending baseline labs and pharmacy benefits investigation.   All questions encouraged and answered.   History of vertebral fracture: L1 compression fracture after a fall in January 2024.  She underwent a kyphoplasty on 04/08/2022.  Vitamin D deficiency: Plan to check Vitamin D today.   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 Patient was diagnosed with diabetic amyotrophy affecting left thigh and has been under the care of Dr. Maple [facility] neurology.  Patient underwent 8 Solu-Medrol IV infusions starting on 11/28/2021  Essential hypertension: Blood pressure was 149/82 today in the office.  Her  blood pressure was rechecked prior to leaving.  She was advised to reach out to her PCP if her blood pressure remains elevated.  Diabetes mellitus type 2 with complications  Hyperlipidemia associated with type 2 diabetes mellitus  History of hypothyroidism  Orders: Orders Placed This Encounter  Procedures   COMPLETE METABOLIC PANEL WITH GFR   Parathyroid hormone, intact (no Ca)   VITAMIN D 25 Hydroxy (Vit-D Deficiency, Fractures)   Serum protein electrophoresis with reflex   No orders of the defined types were placed in this encounter.    Follow-Up Instructions: Return in about 5 months (around 12/23/2022) for Rheumatoid arthritis, Osteoarthritis.   Gearldine Bienenstock, PA-C  Note - This record has been created using Dragon software.  Chart creation errors have been sought, but may not always  have been located. Such creation errors do not reflect on  the standard of medical care.

## 2022-07-22 NOTE — Telephone Encounter (Signed)
Last Fill: 02/25/2022  Labs: 07/12/2022 WBC 11.1, Hgb 11.3, Hct 35.2, RDW 16.8, BMP WNL  Next Visit: Due January 2024. Message sent to the front to schedule.   Last Visit: 01/02/2022  DX: Rheumatoid arthritis involving multiple sites with positive rheumatoid factor   Current Dose per phone note 02/04/2022: arava 20 mg 1 tablet daily.   Okay to refill Arava ?

## 2022-07-22 NOTE — Telephone Encounter (Signed)
Please schedule patient a follow up visit. Patient due January 2024. Thanks!  

## 2022-07-23 ENCOUNTER — Ambulatory Visit: Payer: Medicare Other | Attending: Physician Assistant | Admitting: Physician Assistant

## 2022-07-23 ENCOUNTER — Encounter: Payer: Self-pay | Admitting: Physician Assistant

## 2022-07-23 ENCOUNTER — Telehealth: Payer: Self-pay | Admitting: Pharmacist

## 2022-07-23 VITALS — BP 153/82 | HR 77 | Resp 14 | Ht 65.0 in | Wt 178.4 lb

## 2022-07-23 DIAGNOSIS — Z79899 Other long term (current) drug therapy: Secondary | ICD-10-CM

## 2022-07-23 DIAGNOSIS — E559 Vitamin D deficiency, unspecified: Secondary | ICD-10-CM | POA: Diagnosis not present

## 2022-07-23 DIAGNOSIS — M0579 Rheumatoid arthritis with rheumatoid factor of multiple sites without organ or systems involvement: Secondary | ICD-10-CM | POA: Diagnosis not present

## 2022-07-23 DIAGNOSIS — Z8739 Personal history of other diseases of the musculoskeletal system and connective tissue: Secondary | ICD-10-CM | POA: Diagnosis not present

## 2022-07-23 DIAGNOSIS — M7062 Trochanteric bursitis, left hip: Secondary | ICD-10-CM

## 2022-07-23 DIAGNOSIS — Z8639 Personal history of other endocrine, nutritional and metabolic disease: Secondary | ICD-10-CM

## 2022-07-23 DIAGNOSIS — M65332 Trigger finger, left middle finger: Secondary | ICD-10-CM

## 2022-07-23 DIAGNOSIS — M19041 Primary osteoarthritis, right hand: Secondary | ICD-10-CM | POA: Diagnosis not present

## 2022-07-23 DIAGNOSIS — E1169 Type 2 diabetes mellitus with other specified complication: Secondary | ICD-10-CM

## 2022-07-23 DIAGNOSIS — Z96652 Presence of left artificial knee joint: Secondary | ICD-10-CM

## 2022-07-23 DIAGNOSIS — I10 Essential (primary) hypertension: Secondary | ICD-10-CM

## 2022-07-23 DIAGNOSIS — M19072 Primary osteoarthritis, left ankle and foot: Secondary | ICD-10-CM

## 2022-07-23 DIAGNOSIS — M19042 Primary osteoarthritis, left hand: Secondary | ICD-10-CM

## 2022-07-23 DIAGNOSIS — E118 Type 2 diabetes mellitus with unspecified complications: Secondary | ICD-10-CM

## 2022-07-23 DIAGNOSIS — E1144 Type 2 diabetes mellitus with diabetic amyotrophy: Secondary | ICD-10-CM

## 2022-07-23 DIAGNOSIS — M19071 Primary osteoarthritis, right ankle and foot: Secondary | ICD-10-CM

## 2022-07-23 DIAGNOSIS — E785 Hyperlipidemia, unspecified: Secondary | ICD-10-CM

## 2022-07-23 DIAGNOSIS — Z8781 Personal history of (healed) traumatic fracture: Secondary | ICD-10-CM

## 2022-07-23 NOTE — Telephone Encounter (Signed)
Patient will be new start to [facility] pending DEXA results to be sent to our clinic.  Pt has history of kyphoplasty in Jan 2024 s/p lumbar fracture  Dose: once daily (one pen can be used for 28 days max)  Chesley Mires, PharmD, MPH, BCPS, CPP Clinical Pharmacist (Rheumatology and Pulmonology)

## 2022-07-23 NOTE — Progress Notes (Signed)
Pharmacy Note  Subjective:  Patient presents today to [facility] for follow up office visit.   Patient was seen by the pharmacist for counseling on Forteo.   She had L1 fracture and kyphoplasty in Jan 2024.  Objective: CMP     Component Value Date/Time   NA 140 07/05/2022 0902   K 3.5 07/05/2022 0902   CL 102 07/05/2022 0902   CO2 29 07/05/2022 0902   GLUCOSE 83 07/05/2022 0902   BUN 8 07/05/2022 0902   CREATININE 0.70 07/05/2022 0902   CREATININE 0.63 02/20/2022 1040   CALCIUM 9.8 07/05/2022 0902   PROT 7.0 02/20/2022 1040   ALBUMIN 4.0 07/30/2021 0816   AST 14 02/20/2022 1040   ALT 12 02/20/2022 1040   ALKPHOS 45 07/30/2021 0816   BILITOT 0.2 02/20/2022 1040   GFRNONAA 53 (L) 04/08/2022 1340   GFRNONAA 87 09/18/2020 0953   GFRAA 101 09/18/2020 0953    Vitamin D Lab Results  Component Value Date   VD25OH 41.55 07/12/2022   Pending DEXA  Assessment/Plan:    Counseled patient on purpose, proper use, storage, and adverse effects of Forteo. Discussed the Forteo is PTH peptide agonist which results in stimulation of osteoblast and bone mass. Reviewed that Forteo treatment course is for 2 years. Discussed that pen is stable for 28 days after first use and must be stored in fridge after each dose. Counseled patient that Sharren Bridge is a medication that must be injected once daily and that prescription for pen needles will be sent with Forteo prescription.  Advised patient to continue taking calcium 1200 mg daily and vitamin D supplement.  Reviewed the most common adverse effects of Forteo including orthostatic hypotension, GI upset, injection site reaction, and joint pain/arthralgia.  Discussed injection site reaction management and injection site locations. Discussed alternating injection site.  Discussed management of dizziness (which can occur for up to 4 hours after injection) including adequate hydration, slowly rising from bed/chairs to prevent fals, and taking Forteo  at night if needed.  We walked through [employer] administration with demo pen and pen needle.  Discussed appropriate disposal of sharps.     Forteo prescription pending baseline labs and pharmacy benefits investigation.   All questions encouraged and answered.   Chesley Mires, PharmD, MPH, BCPS, CPP Clinical Pharmacist (Rheumatology and Pulmonology)

## 2022-07-23 NOTE — Patient Instructions (Addendum)
Standing Labs We placed an order today for your standing lab work.   Please have your standing labs drawn in July and every 3 months   Please have your labs drawn 2 weeks prior to your appointment so that the provider can discuss your lab results at your appointment, if possible.  Please note that you may see your imaging and lab results in MyChart before we have reviewed them. We will contact you once all results are reviewed. Please allow our office up to 72 hours to thoroughly review all of the results before contacting the office for clarification of your results.  WALK-IN LAB HOURS  Monday through Thursday from 8:00 am -12:30 pm and 1:00 pm-5:00 pm and Friday from 8:00 am-12:00 pm.  Patients with office visits requiring labs will be seen before walk-in labs.  You may encounter longer than normal wait times. Please allow additional time. Wait times may be shorter on  Monday and Thursday afternoons.  We do not book appointments for walk-in labs. We appreciate your patience and understanding with our staff.   Labs are drawn by [facility]. Please bring your co-pay at the time of your lab draw.  You may receive a bill from [facility] for your lab work.  Please note if you are on Hydroxychloroquine and and an order has been placed for a Hydroxychloroquine level,  you will need to have it drawn 4 hours or more after your last dose.  If you wish to have your labs drawn at another location, please call the office 24 hours in advance so we can fax the orders.  The office is located at [address], [place], [place] [address]   If you have any questions regarding directions or hours of operation,  please call [phone].   As a reminder, please drink plenty of water prior to coming for your lab work. Thanks!  Teriparatide Injection What is this medication? TERIPARATIDE (terr ih PAR a tyd) treats osteoporosis. It works by Interior and spatial designer stronger and less likely to break  (fracture). This medicine may be used for other purposes; ask your health care provider or pharmacist if you have questions. COMMON BRAND NAME(S): FORTEO What should I tell my care team before I take this medication? They need to know if you have any of these conditions: Bone disease other than osteoporosis High levels of calcium in the blood History of cancer in the bone Kidney stone Paget's disease Parathyroid disease Receiving radiation therapy An unusual or allergic reaction to teriparatide, other medications, foods, dyes, or preservatives Pregnant or trying to get pregnant Breast-feeding How should I use this medication? This medication is injected under the skin. You will be taught how to prepare and give it. Take it as directed on the prescription label at the same time every day. Keep taking it unless your care team tells you to stop. This medication comes with INSTRUCTIONS FOR USE. Ask your pharmacist for directions on how to use this medication. Read the information carefully. Talk to your pharmacist or care team if you have questions. It is important that you put your used needles and pens in a special sharps container. Do not put them in a trash can. If you do not have a sharps container, call your pharmacist or care team to get one. A special MedGuide will be given to you by the pharmacist with each prescription and refill. Be sure to read this information carefully each time. Talk to your care team about the use  of this medication in children. Special care may be needed. Overdosage: If you think you have taken too much of this medicine contact a poison control center or emergency room at once. NOTE: This medicine is only for you. Do not share this medicine with others. What if I miss a dose? If you miss a dose, take it as soon as you can. If it is almost time for your next dose, take only that dose. Do not take double or extra doses. What may interact with this  medication? Digoxin This list may not describe all possible interactions. Give your health care provider a list of all the medicines, herbs, non-prescription drugs, or dietary supplements you use. Also tell them if you smoke, drink alcohol, or use illegal drugs. Some items may interact with your medicine. What should I watch for while using this medication? Visit your care team for regular checks on your progress. You may need blood work while you are taking this medication. You should make sure you get enough calcium and vitamin D while you are taking this medication. Discuss the foods you eat and the vitamins you take with your care team. This medication may affect your coordination, reaction time, or judgment. Do not drive or operate machinery until you know how this medication affects you. Sit up or stand slowly to reduce the risk of dizzy or fainting spells. Drinking alcohol with this medication can increase the risk of these side effects. Talk to your care team about your risk of cancer. You may be more at risk for certain types of cancers if you take this medication. What side effects may I notice from receiving this medication? Side effects that you should report to your care team as soon as possible: Allergic reactions--skin rash, itching, hives, swelling of the face, lips, tongue, or throat High calcium level--increased thirst or amount of urine, nausea, vomiting, confusion, unusual weakness or fatigue, bone pain Kidney stones--blood in the urine, pain or trouble passing urine, pain in the lower back or sides Low blood pressure--dizziness, feeling faint or lightheaded, blurry vision Side effects that usually do not require medical attention (report these to your care team if they continue or are bothersome): Dizziness Headache Joint pain Nausea Pain, redness, or irritation at injection site This list may not describe all possible side effects. Call your doctor for medical advice about  side effects. You may report side effects to FDA at 1-800-FDA-1088. Where should I keep my medication? Keep out of the reach of children and pets. Store the pens in the refrigerator. Do not freeze. Use the pen quickly after taking out of the refrigerator and recap and return to refrigerator right after using. Protect from light. Get rid of any unused medication 28 days after the first injection from the pen. Get rid of any unopened, unused medication after the expiration date on the label. To get rid of medications that are no longer needed or have expired: Take the medication to a medication take-back program. Check with your pharmacy or law enforcement to find a location. If you cannot return the medication, ask your pharmacist or care team how to get rid of this medication safely. NOTE: This sheet is a summary. It may not cover all possible information. If you have questions about this medicine, talk to your doctor, pharmacist, or health care provider.  2023 Elsevier/Gold Standard (2021-06-26 00:00:00)

## 2022-07-24 ENCOUNTER — Other Ambulatory Visit: Payer: Self-pay | Admitting: Orthopedic Surgery

## 2022-07-24 DIAGNOSIS — M5451 Vertebrogenic low back pain: Secondary | ICD-10-CM | POA: Diagnosis not present

## 2022-07-24 DIAGNOSIS — G8918 Other acute postprocedural pain: Secondary | ICD-10-CM | POA: Diagnosis not present

## 2022-07-24 DIAGNOSIS — M545 Low back pain, unspecified: Secondary | ICD-10-CM | POA: Insufficient documentation

## 2022-07-24 LAB — COMPLETE METABOLIC PANEL WITH GFR
ALT: 9 U/L (ref 6–29)
Potassium: 3.8 mmol/L (ref 3.5–5.3)

## 2022-07-24 LAB — PARATHYROID HORMONE, INTACT (NO CA): PTH: 35 pg/mL (ref 16–77)

## 2022-07-24 NOTE — Progress Notes (Signed)
Vitamin D WNL. CMP WNL

## 2022-07-24 NOTE — Progress Notes (Signed)
PTH WNL

## 2022-07-25 ENCOUNTER — Other Ambulatory Visit (HOSPITAL_COMMUNITY): Payer: Self-pay

## 2022-07-25 ENCOUNTER — Ambulatory Visit
Admission: RE | Admit: 2022-07-25 | Discharge: 2022-07-25 | Disposition: A | Discharge status: Home / Self Care | Payer: Medicare Other | Source: Ambulatory Visit | Attending: Orthopedic Surgery | Admitting: Orthopedic Surgery

## 2022-07-25 DIAGNOSIS — M5451 Vertebrogenic low back pain: Secondary | ICD-10-CM

## 2022-07-25 NOTE — Telephone Encounter (Signed)
Clinical questions for Forteo PA completed  Chesley Mires, PharmD, MPH, BCPS, CPP Clinical Pharmacist (Rheumatology and Pulmonology)

## 2022-07-25 NOTE — Telephone Encounter (Signed)
Have we received DEXA results yet?

## 2022-07-25 NOTE — Telephone Encounter (Signed)
Submitted a Prior Authorization request to [facility] for FORTEO via CoverMyMeds. Pending clinical questions to populate  Key: [id number]

## 2022-07-25 NOTE — Telephone Encounter (Signed)
Received notification from [facility] regarding a prior authorization for FORTEO. Authorization has been APPROVED from 07/25/22 to 07/25/23. Approval letter sent to scan center.  Unable to run test claim because patient must fill through [facility]: [phone]. She should be able to fill both brand and generic  Authorization # [id number]  Forteo copay card [facility]): $9450 annually BIN: [id number] PCN: [id number] Group: [id number] ID: [id number] Expires : 04/01/23  Teriparatide copay card [facility]) - $12000 annually BIN: [id number] PCN: [id number] Group: [id number] ID: [id number]  Teriparatide copay card ([facility]) - $12000 annually BIN: [id number] PCN: [id number] Group: [id number] ID: [id number] Expires: 04/01/2023  Forteo/teriparatide initiation pending DEXA results.  Chesley Mires, PharmD, MPH, BCPS, CPP Clinical Pharmacist (Rheumatology and Pulmonology)

## 2022-07-25 NOTE — Telephone Encounter (Signed)
We have not received DEXA results. I called [facility] again to check status and they will not send without a signed medical release. Patient can go on their website and submit an electronic signature. I have attempted to call the patient and left a message to advise patient she will need to sign a release for the DEXA results, either online or in person. Advised the patient to call our office.

## 2022-07-26 LAB — COMPLETE METABOLIC PANEL WITH GFR
BUN: 7 mg/dL (ref 7–25)
Glucose, Bld: 90 mg/dL (ref 65–99)
Total Protein: 6.7 g/dL (ref 6.1–8.1)
eGFR: 95 mL/min/{1.73_m2} (ref >=60)

## 2022-07-27 LAB — PROTEIN ELECTROPHORESIS, SERUM, WITH REFLEX
Albumin ELP: 3.6 g/dL — ABNORMAL LOW (ref 3.8–4.8)
Alpha 1: 0.4 g/dL — ABNORMAL HIGH (ref 0.2–0.3)
Alpha 2: 1 g/dL — ABNORMAL HIGH (ref 0.5–0.9)
Beta 2: 0.4 g/dL (ref 0.2–0.5)
Beta Globulin: 0.4 g/dL (ref 0.4–0.6)
Gamma Globulin: 0.7 g/dL — ABNORMAL LOW (ref 0.8–1.7)
Total Protein: 6.6 g/dL (ref 6.1–8.1)

## 2022-07-27 LAB — COMPLETE METABOLIC PANEL WITH GFR
AG Ratio: 1.4 (calc) (ref 1.0–2.5)
AST: 13 U/L (ref 10–35)
Albumin: 3.9 g/dL (ref 3.6–5.1)
Alkaline phosphatase (APISO): 63 U/L (ref 37–153)
BUN/Creatinine Ratio: 12 (calc) (ref 6–22)
CO2: 30 mmol/L (ref 20–32)
Calcium: 10 mg/dL (ref 8.6–10.4)
Chloride: 102 mmol/L (ref 98–110)
Creat: 0.58 mg/dL — ABNORMAL LOW (ref 0.60–1.00)
Globulin: 2.8 g/dL (calc) (ref 1.9–3.7)
Sodium: 140 mmol/L (ref 135–146)
Total Bilirubin: 0.2 mg/dL (ref 0.2–1.2)

## 2022-07-27 LAB — IFE INTERPRETATION

## 2022-07-27 LAB — VITAMIN D 25 HYDROXY (VIT D DEFICIENCY, FRACTURES): Vit D, 25-Hydroxy: 44 ng/mL (ref 30–100)

## 2022-07-29 NOTE — Progress Notes (Signed)
IFE did not reveal any abnormal protein bands.

## 2022-07-31 ENCOUNTER — Other Ambulatory Visit: Payer: Medicare Other

## 2022-08-06 DIAGNOSIS — E1165 Type 2 diabetes mellitus with hyperglycemia: Secondary | ICD-10-CM | POA: Diagnosis not present

## 2022-08-08 DIAGNOSIS — M4856XA Collapsed vertebra, not elsewhere classified, lumbar region, initial encounter for fracture: Secondary | ICD-10-CM | POA: Diagnosis not present

## 2022-08-08 DIAGNOSIS — M48061 Spinal stenosis, lumbar region without neurogenic claudication: Secondary | ICD-10-CM | POA: Diagnosis not present

## 2022-08-08 DIAGNOSIS — M5459 Other low back pain: Secondary | ICD-10-CM | POA: Diagnosis not present

## 2022-08-18 ENCOUNTER — Other Ambulatory Visit: Payer: Self-pay | Admitting: Endocrinology

## 2022-08-18 ENCOUNTER — Other Ambulatory Visit: Payer: Self-pay | Admitting: Internal Medicine

## 2022-08-18 DIAGNOSIS — I10 Essential (primary) hypertension: Secondary | ICD-10-CM

## 2022-08-19 DIAGNOSIS — K648 Other hemorrhoids: Secondary | ICD-10-CM | POA: Diagnosis not present

## 2022-08-19 DIAGNOSIS — K581 Irritable bowel syndrome with constipation: Secondary | ICD-10-CM | POA: Diagnosis not present

## 2022-08-21 ENCOUNTER — Ambulatory Visit (INDEPENDENT_AMBULATORY_CARE_PROVIDER_SITE_OTHER): Payer: Medicare Other | Admitting: Podiatry

## 2022-08-21 ENCOUNTER — Ambulatory Visit: Payer: Medicare Other | Admitting: Podiatry

## 2022-08-21 DIAGNOSIS — Q828 Other specified congenital malformations of skin: Secondary | ICD-10-CM | POA: Diagnosis not present

## 2022-08-21 DIAGNOSIS — B351 Tinea unguium: Secondary | ICD-10-CM

## 2022-08-21 DIAGNOSIS — E119 Type 2 diabetes mellitus without complications: Secondary | ICD-10-CM | POA: Diagnosis not present

## 2022-08-21 DIAGNOSIS — Z794 Long term (current) use of insulin: Secondary | ICD-10-CM

## 2022-08-21 DIAGNOSIS — M79675 Pain in left toe(s): Secondary | ICD-10-CM

## 2022-08-21 DIAGNOSIS — M79674 Pain in right toe(s): Secondary | ICD-10-CM | POA: Diagnosis not present

## 2022-08-21 LAB — HM DIABETES FOOT EXAM: HM Diabetic Foot Exam: NORMAL

## 2022-08-24 ENCOUNTER — Other Ambulatory Visit: Payer: Self-pay | Admitting: Endocrinology

## 2022-08-25 NOTE — Progress Notes (Signed)
  Subjective:  Patient ID: Marissa Roberts, female    DOB: 03-20-1946,  MRN: [id number]  Marissa Roberts presents to clinic today for preventative diabetic foot care and painful porokeratotic lesion(s) right foot and painful mycotic toenails that limit ambulation. Painful toenails interfere with ambulation. Aggravating factors include wearing enclosed shoe gear. Pain is relieved with periodic professional debridement. Painful porokeratotic lesions are aggravated when weightbearing with and without shoegear. Pain is relieved with periodic professional debridement.  Chief Complaint  Patient presents with   Nail Problem    DFC BS-103 A1C-6.0 PCP-[facility] PCP VST-07/2022   New problem(s): None.   PCP is Myrlene Broker, MD.  Allergies  Allergen Reactions   Contrast Media [Iodinated Contrast Media]     Throat swells from ct contra   Adhesive [Tape]     No paper tape, causes blisters   Erythromycin Rash   Penicillins Rash    Did it involve swelling of the face/tongue/throat, SOB, or low BP? No Did it involve sudden or severe rash/hives, skin peeling, or any reaction on the inside of your mouth or nose? No Did you need to seek medical attention at a hospital or doctor's office? No When did it last happen?    Many years If all above answers are "NO", may proceed with cephalosporin use.     Review of Systems: Negative except as noted in the HPI.  Objective: No changes noted in today's physical examination. There were no vitals filed for this visit. Marissa Roberts is a pleasant 77 y.o. female in NAD. AAO x 3.  Vascular Examination: Capillary fill time to digits <3 seconds b/l lower extremities. Intact b/l with palpable pedal pulses. No edema. No pain with calf compression b/l. Skin temperature gradient WNL b/l.   Neurological Examination: Sensation grossly intact b/l with 10 gram monofilament. Vibratory sensation intact b/l. Pt has subjective symptoms of  neuropathy.  Dermatological Examination: Skin warm and supple b/l lower extremities.   Porokeratotic lesion(s) R 4th toe. No erythema, no edema, no drainage, no fluctuance.   Toenails 1-5 b/l thick, discolored, elongated with subungual debris and pain on dorsal palpation.   Musculoskeletal Examination: Normal muscle strength 5/5 to all lower extremity muscle groups bilaterally. Hammertoe(s) noted to the L 5th toe and R 5th toe.  Assessment/Plan: 1. Pain due to onychomycosis of toenails of both feet   2. Porokeratosis   3. Type 2 diabetes mellitus without complication, with long-term current use of insulin (HCC)     -Patient was evaluated and treated. All patient's and/or POA's questions/concerns answered on today's visit. -Patient to continue soft, supportive shoe gear daily. -Toenails 1-5 b/l were debrided in length and girth with sterile nail nippers and dremel without iatrogenic bleeding.  -Porokeratotic lesion(s) R 4th toe pared and enucleated with sterile currette without incident. Total number of lesions debrided=1. -Patient/POA to call should there be question/concern in the interim.   Return in about 9 weeks (around 10/23/2022).  Freddie Breech, DPM

## 2022-08-27 ENCOUNTER — Telehealth: Payer: Self-pay

## 2022-08-27 NOTE — Telephone Encounter (Signed)
Should patient still be on Potassium?

## 2022-08-27 NOTE — Telephone Encounter (Signed)
Message sent to [facility] to make sure refill is appropriate

## 2022-09-03 DIAGNOSIS — M0550 Rheumatoid polyneuropathy with rheumatoid arthritis of unspecified site: Secondary | ICD-10-CM | POA: Diagnosis not present

## 2022-09-03 DIAGNOSIS — E663 Overweight: Secondary | ICD-10-CM | POA: Diagnosis not present

## 2022-09-06 ENCOUNTER — Other Ambulatory Visit: Payer: Self-pay | Admitting: Endocrinology

## 2022-09-06 DIAGNOSIS — E1165 Type 2 diabetes mellitus with hyperglycemia: Secondary | ICD-10-CM | POA: Diagnosis not present

## 2022-09-09 DIAGNOSIS — M48061 Spinal stenosis, lumbar region without neurogenic claudication: Secondary | ICD-10-CM | POA: Diagnosis not present

## 2022-09-09 DIAGNOSIS — M5451 Vertebrogenic low back pain: Secondary | ICD-10-CM | POA: Diagnosis not present

## 2022-09-09 DIAGNOSIS — M5459 Other low back pain: Secondary | ICD-10-CM | POA: Diagnosis not present

## 2022-09-09 DIAGNOSIS — M4856XA Collapsed vertebra, not elsewhere classified, lumbar region, initial encounter for fracture: Secondary | ICD-10-CM | POA: Diagnosis not present

## 2022-09-09 DIAGNOSIS — M5416 Radiculopathy, lumbar region: Secondary | ICD-10-CM | POA: Insufficient documentation

## 2022-09-19 DIAGNOSIS — M5416 Radiculopathy, lumbar region: Secondary | ICD-10-CM | POA: Diagnosis not present

## 2022-09-24 ENCOUNTER — Other Ambulatory Visit: Payer: Medicare Other

## 2022-09-25 DIAGNOSIS — K648 Other hemorrhoids: Secondary | ICD-10-CM | POA: Diagnosis not present

## 2022-09-25 DIAGNOSIS — E119 Type 2 diabetes mellitus without complications: Secondary | ICD-10-CM | POA: Diagnosis not present

## 2022-09-25 DIAGNOSIS — Z1211 Encounter for screening for malignant neoplasm of colon: Secondary | ICD-10-CM | POA: Diagnosis not present

## 2022-09-30 ENCOUNTER — Other Ambulatory Visit: Payer: Medicare Other

## 2022-10-01 ENCOUNTER — Other Ambulatory Visit (INDEPENDENT_AMBULATORY_CARE_PROVIDER_SITE_OTHER): Payer: Medicare Other

## 2022-10-01 ENCOUNTER — Other Ambulatory Visit: Payer: Medicare Other

## 2022-10-01 DIAGNOSIS — E118 Type 2 diabetes mellitus with unspecified complications: Secondary | ICD-10-CM

## 2022-10-01 LAB — BASIC METABOLIC PANEL
BUN: 8 mg/dL (ref 6–23)
CO2: 26 mEq/L (ref 19–32)
Calcium: 10.1 mg/dL (ref 8.4–10.5)
Chloride: 104 mEq/L (ref 96–112)
Creatinine, Ser: 0.66 mg/dL (ref 0.40–1.20)
GFR: 86 mL/min (ref >60.00)
Glucose, Bld: 117 mg/dL — ABNORMAL HIGH (ref 70–99)
Potassium: 3.8 mEq/L (ref 3.5–5.1)
Sodium: 140 mEq/L (ref 135–145)

## 2022-10-01 LAB — HEMOGLOBIN A1C: Hgb A1c MFr Bld: 5.7 % (ref 4.6–6.5)

## 2022-10-06 DIAGNOSIS — E1165 Type 2 diabetes mellitus with hyperglycemia: Secondary | ICD-10-CM | POA: Diagnosis not present

## 2022-10-08 ENCOUNTER — Ambulatory Visit (INDEPENDENT_AMBULATORY_CARE_PROVIDER_SITE_OTHER): Payer: Medicare Other | Admitting: Endocrinology

## 2022-10-08 ENCOUNTER — Encounter: Payer: Self-pay | Admitting: Endocrinology

## 2022-10-08 VITALS — BP 140/65 | HR 93 | Ht 65.0 in | Wt 179.0 lb

## 2022-10-08 DIAGNOSIS — Z794 Long term (current) use of insulin: Secondary | ICD-10-CM

## 2022-10-08 DIAGNOSIS — E118 Type 2 diabetes mellitus with unspecified complications: Secondary | ICD-10-CM

## 2022-10-08 DIAGNOSIS — E063 Autoimmune thyroiditis: Secondary | ICD-10-CM

## 2022-10-08 DIAGNOSIS — I10 Essential (primary) hypertension: Secondary | ICD-10-CM

## 2022-10-08 NOTE — Patient Instructions (Addendum)
Tresiba 28 units  No sweet tea

## 2022-10-08 NOTE — Progress Notes (Signed)
Patient ID: Marissa Roberts, female   DOB: 1945-12-25, 77 y.o.   MRN: [id number]               Reason for Appointment:  Follow-up for endocrine problems   History of Present Illness:             Type 2 diabetes mellitus, date of diagnosis: 2005       INSULIN regimen is  Tresiba 30 units am, Novolog rarely  Oral hypoglycemic drugs the patient is taking are: Metformin 2 g daily, Farxiga 5 daily   Current blood sugar patterns, management and problems identified:  Her A1c is about the same at 5.7   Her Evaristo Bury was reduced by 4 units in the last visit Even with this her blood sugars are excellent overnight and generally averaging below 100 Although she thinks that sometimes she has low sugars late at night this is likely artifacts She is periodically eating out at lunch and will have regular sweet tea which will cause significantly high readings at times Currently not taking NovoLog Because of her leg weakness she has not had any exercise but her weight is slightly better Not concerned currently about cost of Farxiga  Dinner usually at 6-7pm  Side effects from medications have been:none  Analysis of the [facility] G6 download for the last 2 weeks through 7/9 as follows  Overnight blood sugars are usually quite stable averaging in the 90s with some variability but no hypoglycemia  Blood sugars are slowly rising after 6 AM indicating dawn phenomenon Premeal blood sugars are mildly increased at lunch and dinner but still in the mid 100s at the most Postprandial readings are generally variable after lunch which is the highest time of the day; she has some blood sugar spikes over 180 and as high as 250 Evening blood sugars are fairly good Hypoglycemia detected only a couple of times in the late evening but likely artifacts  Time in range similar to the last visit  CGM use % of time   2-week average/GV 118  Time in range  92      %  % Time Above 180 7  % Time above 250   % Time  Below 70 1     PRE-MEAL Fasting Lunch Dinner Bedtime Overall  Glucose range:       Averages: 94       POST-MEAL PC Breakfast PC Lunch PC Dinner  Glucose range:     Averages:  168     Prior  CGM use % of time   2-week average/GV 119  Time in range        93%  % Time Above 180 6  % Time above 250   % Time Below 70 1    Self-care:   Meals: 3 meals per day. Breakfast is usually grits with or without eggs or meat Not always restricting fat intake or sweet tea  when eating out      Dietician visit, most recent:2014.               Weight history:  Wt Readings from Last 3 Encounters:  10/08/22 179 lb (81.2 kg)  07/23/22 178 lb 6.4 oz (80.9 kg)  07/12/22 181 lb (82.1 kg)    Glycemic control:   Lab Results  Component Value Date   HGBA1C 5.7 10/01/2022   HGBA1C 5.5 07/05/2022   HGBA1C 6.6 (H) 03/05/2022   Lab Results  Component Value Date   MICROALBUR 28.0 (H)  07/05/2022   LDLCALC 72 07/05/2022   CREATININE 0.66 10/01/2022   Lab Results  Component Value Date   FRUCTOSAMINE 194 07/30/2021    Lab Results  Component Value Date   HGB 11.3 (L) 07/12/2022     Past history:  She thinks her blood sugars were only mildly increased at borderline levels at the onsetper Most likely she was treated with metformin initially and this has been continued.   At some point she was also tried on Kombiglyze and Byetta for improving her control. She is not sure why she was started on insulin 5 years ago, presumably for worsening hyperglycemia Also not clear if she has tried various insulin regimens before starting Lantus and NovoLog  Previously had tried the V-go pump but subsequently she stopped doing the V-go pump because of the cost, skin irritation and local discomfort   Allergies as of 10/08/2022       Reactions   Contrast Media [iodinated Contrast Media]    Throat swells from ct contra   Adhesive [tape]    No paper tape, causes blisters   Erythromycin Rash    Penicillins Rash   Did it involve swelling of the face/tongue/throat, SOB, or low BP? No Did it involve sudden or severe rash/hives, skin peeling, or any reaction on the inside of your mouth or nose? No Did you need to seek medical attention at a hospital or doctor's office? No When did it last happen?    Many years If all above answers are "NO", may proceed with cephalosporin use.        Medication List        Accurate as of October 08, 2022  9:58 AM. If you have any questions, ask your nurse or doctor.          acetaminophen 325 MG tablet Commonly known as: Tylenol Take 2 tablets (650 mg total) by mouth every 6 (six) hours as needed for moderate pain.   amLODipine 10 MG tablet Commonly known as: NORVASC Take 1 tablet by mouth once daily   BD Pen Needle Nano U/F 32G X 4 MM Misc Generic drug: Insulin Pen Needle USE 1  4 TIMES DAILY TO  INJECT  INSULIN   Biotin 29528 MCG Tabs Take 10,000 mcg by mouth daily.   CALCIUM 600 + D PO Take 1 tablet by mouth daily.   dapagliflozin propanediol 5 MG Tabs tablet Commonly known as: Farxiga Take 1 tablet (5 mg total) by mouth daily.   Dexcom G6 Receiver Devi For sensor monitoring   Dexcom G6 Sensor Misc Use to monitor blood sugar, change after 10 days   Dexcom G6 Transmitter Misc 4 PER YEAR   diclofenac Sodium 1 % Gel Commonly known as: VOLTAREN Apply topically as needed.   fluticasone 50 MCG/ACT nasal spray Commonly known as: FLONASE Place 2 sprays into both nostrils 2 (two) times daily as needed for allergies or rhinitis.   furosemide 40 MG tablet Commonly known as: LASIX Take 2 tablets by mouth once daily What changed: how much to take   GLUCOSAMINE-MSM PO Take 1 tablet by mouth daily.   latanoprost 0.005 % ophthalmic solution Commonly known as: XALATAN SMARTSIG:1 Drop(s) In Eye(s) Every Evening   leflunomide 20 MG tablet Commonly known as: ARAVA Take 1 tablet by mouth once daily   levothyroxine 112 MCG  tablet Commonly known as: SYNTHROID TAKE 1 TABLET BY MOUTH BEFORE BREAKFAST   Linzess 290 MCG Caps capsule Generic drug: linaclotide TAKE 1 CAPSULE BY  MOUTH ONCE DAILY AS NEEDED   Magnesium 100 MG Caps Take 100 mg by mouth daily.   metFORMIN 1000 MG tablet Commonly known as: GLUCOPHAGE TAKE 1 TABLET BY MOUTH TWICE DAILY WITH A MEAL   montelukast 10 MG tablet Commonly known as: SINGULAIR TAKE 1 TABLET BY MOUTH AT BEDTIME   NovoLOG FlexPen 100 UNIT/ML FlexPen Generic drug: insulin aspart INJECT 10 TO 15 UNITS SUBCUTANEOUSLY THREE TIMES DAILY BEFORE MEAL(S)   omeprazole 40 MG capsule Commonly known as: PRILOSEC Take 40 mg by mouth daily.   ondansetron 4 MG tablet Commonly known as: Zofran Take 1 tablet (4 mg total) by mouth every 8 (eight) hours as needed for nausea or vomiting.   OneTouch Delica Lancets 33G Misc USE TO CHECK BLOOD SUGAR UP TO THREE TIMES A DAY, 2 HOURS AFTER A MEAL AND UP TO 3 TIMES A WEEK UPON WAKING UP   OneTouch Verio test strip Generic drug: glucose blood USE 1 STRIP TO CHECK GLUCOSE THREE TIMES DAILY 2 HOURS AFTER A MEAL AND 1 THREE TIMES A WEEK UPON WAKING UP   potassium chloride 10 MEQ tablet Commonly known as: KLOR-CON Take 1 tablet by mouth once daily   rosuvastatin 40 MG tablet Commonly known as: CRESTOR Take 1 tablet by mouth once daily   Tresiba FlexTouch 200 UNIT/ML FlexTouch Pen Generic drug: insulin degludec INJECT 30 UNITS SUBCUTANEOUSLY IN THE MORNING   valsartan 320 MG tablet Commonly known as: DIOVAN Take 1 tablet by mouth once daily        Allergies:  Allergies  Allergen Reactions   Contrast Media [Iodinated Contrast Media]     Throat swells from ct contra   Adhesive [Tape]     No paper tape, causes blisters   Erythromycin Rash   Penicillins Rash    Did it involve swelling of the face/tongue/throat, SOB, or low BP? No Did it involve sudden or severe rash/hives, skin peeling, or any reaction on the inside of your  mouth or nose? No Did you need to seek medical attention at a hospital or doctor's office? No When did it last happen?    Many years If all above answers are "NO", may proceed with cephalosporin use.     Past Medical History:  Diagnosis Date   Anemia    Arthritis    Bronchitis    hx of   Diabetes mellitus    type 2   Family history of adverse reaction to anesthesia    per patient, "daughter threw up after a knee surgery"   GERD (gastroesophageal reflux disease)    Hypercholesteremia    Hypertension    Hypothyroidism    Pneumonia    hx of   PONV (postoperative nausea and vomiting)    Thyroid disease     Past Surgical History:  Procedure Laterality Date   ABDOMINAL HYSTERECTOMY     partial   BREAST SURGERY     lumpectomy left breast   carpel tunnel     COLONOSCOPY  07/2020   COLONOSCOPY W/ POLYPECTOMY     FOOT SURGERY     knee arhtroscopy     KNEE ARTHROPLASTY Left 05/2020   KYPHOPLASTY N/A 04/08/2022   Procedure: KYPHOPLASTY L1;  Surgeon: Venita Lick, MD;  Location: [facility] OR;  Service: Orthopedics;  Laterality: N/A;  [facility]   ORIF PATELLA Left 05/23/2020   Procedure: LEFT KNEE POLE PATELLA EXCISION;  Surgeon: Cammy Copa, MD;  Location: [facility] OR;  Service: Orthopedics;  Laterality:  Left;   SHOULDER ARTHROSCOPY WITH SUBACROMIAL DECOMPRESSION, ROTATOR CUFF REPAIR AND BICEP TENDON REPAIR Right 05/26/2012   Procedure: Right Shoulder Diagnostic Operative Arthroscopy, Subacromial Decompression, Biceps Tenodesis, Mini Open Rotator Cuff Repair;  Surgeon: Cammy Copa, MD;  Location: [facility] OR;  Service: Orthopedics;  Laterality: Right;  Right Shoulder Diagnostic Operative Arthroscopy, Subacromial Decompression, Biceps Tenodesis, Mini Open Rotator Cuff Repair   TONSILLECTOMY     TOTAL KNEE ARTHROPLASTY Left 07/01/2019   Procedure: LEFT TOTAL KNEE ARTHROPLASTY;  Surgeon: Cammy Copa, MD;  Location: [facility] OR;  Service: Orthopedics;  Laterality: Left;    UPPER GI ENDOSCOPY  07/2020    Family History  Problem Relation Age of Onset   Diabetes Mother    Heart disease Mother    Hyperlipidemia Mother    Diabetes Father    Diabetes Sister    Hypertension Sister    Heart disease Sister    Dementia Sister    Heart disease Brother    Healthy Daughter    Healthy Daughter    Healthy Daughter    Healthy Son     Social History:  reports that she quit smoking about 32 years ago. Her smoking use included cigarettes. She has never been exposed to tobacco smoke. She has never used smokeless tobacco. She reports that she does not currently use alcohol. She reports that she does not use drugs.    Review of Systems   HYPERTENSION: She is on amlodipine 10 mg once daily and Diovan 3 20 mg  Was tried on diltiazem instead of amlodipine before but she felt that this was causing hair loss and constipation and is back on amlodipine  Also HCTZ had caused hyponatremia Also has a blood pressure monitor at home and does not think it is usually higher than 140-145 systolic   BP Readings from Last 3 Encounters:  10/08/22 (!) 140/65  07/23/22 (!) 153/82  07/12/22 134/88    On Lasix 40 mg for edema  NEPHROPATHY: Minimal increase in microalbumin  LIPIDS:  She is taking rosuvastatin 40 mg, previously LDL was high with simvastatin 20 mg LDL below 100 Crestor prescribed by PCP      Lab Results  Component Value Date   CHOL 150 07/05/2022   HDL 50.70 07/05/2022   LDLCALC 72 07/05/2022   LDLDIRECT 62.0 05/18/2021   TRIG 138.0 07/05/2022   CHOLHDL 3 07/05/2022                  Thyroid:      She has been hypothyroid for over 40 years  She takes levothyroxine before breakfast daily No complaints of unusual fatigue  TSH has been consistent within the normal range with taking 112 mcg  Last labs:  Lab Results  Component Value Date   TSH 0.98 07/05/2022   TSH 1.96 10/29/2021   TSH 0.75 09/27/2021   FREET4 1.42 07/05/2022   FREET4 1.12  10/29/2021   FREET4 1.01 01/18/2021    She is followed regularly by podiatrist  Physical Examination:  BP (!) 140/65 (BP Location: Left Arm, Patient Position: Sitting)   Pulse 93   Ht 5\' 5"  (1.651 m)   Wt 179 lb (81.2 kg)   SpO2 96%   BMI 29.79 kg/m    ASSESSMENT/PLAN:  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:  See history of present illness for detailed discussion of current diabetes management, blood sugar patterns and problems identified  She is currently on basal insulin, Farxiga 5 mg and metformin  A1c is  excellent below 6 and her GMI on the sensor is 6.1  She is usually not needing mealtime coverage but periodically has high readings after lunch based on her diet or drinking sweet tea Unable to exercise Continues to use CGM regularly and results were discussed Overall needing relatively small doses of insulin considering her weight of 81 kg  Recommendations:   She will reduce Tresiba insulin back to 28 units and if still relatively low overnight especially if under 80 will reduce to 26 Take full doses of metformin For now continue 5 mg Farxiga as this is adequately effective Stop drinking sweet tea at lunch Also avoid excessive amount of carbohydrates or high-fat meals when eating out For special occasions she can use 5 to 10 units of NovoLog to cover her meal  HYPERTENSION with microalbuminuria: Blood pressure is high normal Already on maximum doses amlodipine and valsartan Will continue to monitor regularly at home, will need to avoid HCTZ because of hyponatremia  Microalbumin which was increased will need to be followed  Hypothyroidism: To have to the checked again on the next visit   There are no Patient Instructions on file for this visit.   Reather Littler 10/08/2022, 9:58 AM   Note: This office note was prepared with Dragon voice recognition system technology. Any transcriptional errors that result from this process are unintentional.

## 2022-10-09 ENCOUNTER — Other Ambulatory Visit: Payer: Self-pay | Admitting: Endocrinology

## 2022-10-10 ENCOUNTER — Other Ambulatory Visit: Payer: Self-pay | Admitting: Endocrinology

## 2022-10-10 ENCOUNTER — Other Ambulatory Visit: Payer: Self-pay | Admitting: Physician Assistant

## 2022-10-10 ENCOUNTER — Other Ambulatory Visit: Payer: Self-pay | Admitting: Internal Medicine

## 2022-10-10 DIAGNOSIS — Z79899 Other long term (current) drug therapy: Secondary | ICD-10-CM

## 2022-10-10 NOTE — Telephone Encounter (Signed)
Last Fill: 07/22/2022  Labs: 07/12/2022 WBC 11.1, Hgb 11.3, Hct 35.2, RDW 16.8, 07/23/2022 CMP WNL   Next Visit: 10/18/2022  Last Visit: 12/19/2022  DX: Rheumatoid arthritis involving multiple sites with positive rheumatoid factor   Current Dose per office note 07/23/2022: Arava 20 mg 1 tablet by mouth daily   Spoke with patient and reminded patient she is due for labs at the end of the month.   Okay to refill Arava ?

## 2022-10-15 ENCOUNTER — Telehealth: Payer: Self-pay | Admitting: Radiology

## 2022-10-15 ENCOUNTER — Other Ambulatory Visit: Payer: Self-pay

## 2022-10-15 ENCOUNTER — Encounter: Payer: Self-pay | Admitting: Internal Medicine

## 2022-10-15 ENCOUNTER — Encounter: Payer: Self-pay | Admitting: Endocrinology

## 2022-10-15 DIAGNOSIS — M5416 Radiculopathy, lumbar region: Secondary | ICD-10-CM | POA: Diagnosis not present

## 2022-10-15 DIAGNOSIS — M5451 Vertebrogenic low back pain: Secondary | ICD-10-CM | POA: Diagnosis not present

## 2022-10-15 DIAGNOSIS — M4856XA Collapsed vertebra, not elsewhere classified, lumbar region, initial encounter for fracture: Secondary | ICD-10-CM | POA: Diagnosis not present

## 2022-10-15 DIAGNOSIS — M48061 Spinal stenosis, lumbar region without neurogenic claudication: Secondary | ICD-10-CM | POA: Diagnosis not present

## 2022-10-15 MED ORDER — BD PEN NEEDLE NANO U/F 32G X 4 MM MISC: 2 refills | Status: DC

## 2022-10-15 NOTE — Telephone Encounter (Signed)
error 

## 2022-10-16 ENCOUNTER — Telehealth: Payer: Self-pay | Admitting: *Deleted

## 2022-10-16 NOTE — Telephone Encounter (Signed)
Received DEXA results from [facility].  Date of Scan: 04/23/2021  Lowest T-score:-0.6  BMD:1.009  Lowest site measured:AP Spine  DX: WNL  Significant changes in BMD and site measured (5% and above):n/a  Current Regimen:Calcium and Vitamin D  Recommendation:Repeat in 5 years.   Reviewed by:Dr. Pollyann Savoy   Next Appointment:  12/19/2022

## 2022-10-16 NOTE — Telephone Encounter (Signed)
A user error has taken place: encounter opened in error, closed for administrative reasons.

## 2022-10-17 NOTE — Telephone Encounter (Signed)
Received DEXA results from 04/23/2021. Patient is in normal range; previous DEXA in 2020 showed osteopenia. Recommended to repeat DEXA in 5 years but due to lumbar fracture, but consider repeat in 2025.  Patient had lumbar fracture in early 2024.  We had discussed Forteo with patient at last office visit but will need to further discuss at OV in September. Discussed with Sherron Ales ,PA-C  Chesley Mires, PharmD, MPH, BCPS, CPP Clinical Pharmacist (Rheumatology and Pulmonology)

## 2022-10-24 ENCOUNTER — Encounter: Payer: Self-pay | Admitting: Obstetrics and Gynecology

## 2022-10-28 ENCOUNTER — Other Ambulatory Visit: Payer: Self-pay

## 2022-10-28 DIAGNOSIS — Z79899 Other long term (current) drug therapy: Secondary | ICD-10-CM | POA: Diagnosis not present

## 2022-10-29 ENCOUNTER — Other Ambulatory Visit: Payer: Self-pay | Admitting: Endocrinology

## 2022-10-29 ENCOUNTER — Encounter: Payer: Self-pay | Admitting: Podiatry

## 2022-10-29 ENCOUNTER — Ambulatory Visit (INDEPENDENT_AMBULATORY_CARE_PROVIDER_SITE_OTHER): Payer: BC Managed Care – PPO | Admitting: Podiatry

## 2022-10-29 DIAGNOSIS — B351 Tinea unguium: Secondary | ICD-10-CM

## 2022-10-29 DIAGNOSIS — M79675 Pain in left toe(s): Secondary | ICD-10-CM | POA: Diagnosis not present

## 2022-10-29 DIAGNOSIS — E119 Type 2 diabetes mellitus without complications: Secondary | ICD-10-CM

## 2022-10-29 DIAGNOSIS — M79674 Pain in right toe(s): Secondary | ICD-10-CM

## 2022-10-29 DIAGNOSIS — Z0000 Encounter for general adult medical examination without abnormal findings: Secondary | ICD-10-CM | POA: Diagnosis not present

## 2022-10-29 DIAGNOSIS — K648 Other hemorrhoids: Secondary | ICD-10-CM | POA: Diagnosis not present

## 2022-10-29 DIAGNOSIS — Q828 Other specified congenital malformations of skin: Secondary | ICD-10-CM | POA: Diagnosis not present

## 2022-10-29 DIAGNOSIS — E6609 Other obesity due to excess calories: Secondary | ICD-10-CM | POA: Diagnosis not present

## 2022-10-29 DIAGNOSIS — B353 Tinea pedis: Secondary | ICD-10-CM

## 2022-10-29 DIAGNOSIS — Z794 Long term (current) use of insulin: Secondary | ICD-10-CM | POA: Diagnosis not present

## 2022-10-29 DIAGNOSIS — I10 Essential (primary) hypertension: Secondary | ICD-10-CM | POA: Diagnosis not present

## 2022-10-29 DIAGNOSIS — Z6831 Body mass index (BMI) 31.0-31.9, adult: Secondary | ICD-10-CM | POA: Diagnosis not present

## 2022-10-29 LAB — CBC WITH DIFFERENTIAL/PLATELET
Absolute Monocytes: 940 {cells}/uL (ref 200–950)
Basophils Absolute: 56 {cells}/uL (ref 0–200)
Basophils Relative: 0.6 %
Eosinophils Absolute: 216 {cells}/uL (ref 15–500)
Eosinophils Relative: 2.3 %
HCT: 34.9 % — ABNORMAL LOW (ref 35.0–45.0)
Hemoglobin: 11.2 g/dL — ABNORMAL LOW (ref 11.7–15.5)
Lymphs Abs: 3478 {cells}/uL (ref 850–3900)
MCH: 28.5 pg (ref 27.0–33.0)
MCHC: 32.1 g/dL (ref 32.0–36.0)
MCV: 88.8 fL (ref 80.0–100.0)
MPV: 10.8 fL (ref 7.5–12.5)
Monocytes Relative: 10 %
Neutro Abs: 4709 {cells}/uL (ref 1500–7800)
Neutrophils Relative %: 50.1 %
Platelets: 291 10*3/uL (ref 140–400)
RBC: 3.93 Million/uL (ref 3.80–5.10)
RDW: 14.2 % (ref 11.0–15.0)
Total Lymphocyte: 37 %
WBC: 9.4 10*3/uL (ref 3.8–10.8)

## 2022-10-29 LAB — COMPLETE METABOLIC PANEL WITHOUT GFR
AG Ratio: 1.5 (calc) (ref 1.0–2.5)
ALT: 16 U/L (ref 6–29)
AST: 13 U/L (ref 10–35)
Albumin: 3.8 g/dL (ref 3.6–5.1)
Alkaline phosphatase (APISO): 71 U/L (ref 37–153)
BUN: 11 mg/dL (ref 7–25)
CO2: 28 mmol/L (ref 20–32)
Calcium: 9.6 mg/dL (ref 8.6–10.4)
Chloride: 105 mmol/L (ref 98–110)
Creat: 0.6 mg/dL (ref 0.60–1.00)
Globulin: 2.5 g/dL (ref 1.9–3.7)
Glucose, Bld: 106 mg/dL — ABNORMAL HIGH (ref 65–99)
Potassium: 4.1 mmol/L (ref 3.5–5.3)
Sodium: 142 mmol/L (ref 135–146)
Total Bilirubin: 0.3 mg/dL (ref 0.2–1.2)
Total Protein: 6.3 g/dL (ref 6.1–8.1)
eGFR: 94 mL/min/{1.73_m2}

## 2022-10-29 MED ORDER — KETOCONAZOLE 2 % EX CREA
TOPICAL_CREAM | CUTANEOUS | 1 refills | Status: DC
Start: 2022-10-29 — End: 2023-03-12

## 2022-10-29 NOTE — Progress Notes (Signed)
Subjective:  Patient ID: Marissa Roberts, female    DOB: 1945/04/24,  MRN: [id number]  Marissa Roberts presents to clinic today for preventative diabetic foot care and painful porokeratotic lesion(s) right foot and painful mycotic toenails that limit ambulation. Painful toenails interfere with ambulation. Aggravating factors include wearing enclosed shoe gear. Pain is relieved with periodic professional debridement. Painful porokeratotic lesions are aggravated when weightbearing with and without shoegear. Pain is relieved with periodic professional debridement.  Chief Complaint  Patient presents with   Diabetes    DFC BS - 109 A1C - 6 LVPCP - 09/2022   New problem(s):  Patient c/o itching and scaling on the outside of her left foot which also extends around her heel to the inside of her foot. Duration is the past several weeks. Denies any redness, drainage of swelling. She has been scratching her foot with her contralateral foot. Has not attempted any treatment  PCP is Marissa Broker, MD.  Allergies  Allergen Reactions   Contrast Media [Iodinated Contrast Media]     Throat swells from ct contra   Adhesive [Tape]     No paper tape, causes blisters   Erythromycin Rash   Penicillins Rash    Did it involve swelling of the face/tongue/throat, SOB, or low BP? No Did it involve sudden or severe rash/hives, skin peeling, or any reaction on the inside of your mouth or nose? No Did you need to seek medical attention at a hospital or doctor's office? No When did it last happen?    Many years If all above answers are "NO", may proceed with cephalosporin use.     Review of Systems: Negative except as noted in the HPI.  Objective:  There were no vitals filed for this visit. [name] is a pleasant 77 y.o. female WD, WN in NAD. AAO x 3.  Vascular Examination: Capillary fill time to digits <3 seconds b/l lower extremities. Intact b/l with palpable pedal pulses. No edema. No  pain with calf compression b/l. Skin temperature gradient WNL b/l.   Neurological Examination: Sensation grossly intact b/l with 10 gram monofilament. Vibratory sensation intact b/l. Pt has subjective symptoms of neuropathy.  Dermatological Examination: Skin warm and supple b/l lower extremities.   Minimal porokeratotic lesion(s) R 4th toe. No erythema, no edema, no drainage, no fluctuance.   Toenails 1-5 b/l thick, discolored, elongated with subungual debris and pain on dorsal palpation.   Diffuse scaling noted peripherally and plantarly b/l feet.  No interdigital macerations.  No blisters, no weeping. No signs of secondary bacterial infection noted.  Musculoskeletal Examination: Normal muscle strength 5/5 to all lower extremity muscle groups bilaterally. Hammertoe(s) noted to the L 5th toe and R 5th toe.  Assessment/Plan: 1. Pain due to onychomycosis of toenails of both feet   2. Tinea pedis of left foot   3. Porokeratosis   4. Type 2 diabetes mellitus without complication, with long-term current use of insulin (HCC)     Meds ordered this encounter  Medications   ketoconazole (NIZORAL) 2 % cream    Sig: Apply to both feet and between toes once daily for 6 weeks.    Dispense:  60 g    Refill:  1    -Consent given for treatment as described below: -Examined patient. -Continue foot and shoe inspections daily. Monitor blood glucose per PCP/Endocrinologist's recommendations. -Toenails 1-5 b/l were debrided in length and girth with sterile nail nippers and dremel without iatrogenic bleeding.  -Porokeratotic lesion(s) R 4th  toe pared and enucleated with sterile currette without incident. Total number of lesions debrided=1. -For tinea pedis, Rx sent to pharmacy for Ketoconazole Cream 2% to be applied once daily for six weeks. -Patient/POA to call should there be question/concern in the interim.   Return in about 9 weeks (around 12/31/2022).  Freddie Breech, DPM

## 2022-10-29 NOTE — Progress Notes (Signed)
Glucose is 106. Rest of CMP WNL.  Anemia is stable. Rest of CBC WNL.

## 2022-11-11 ENCOUNTER — Other Ambulatory Visit: Payer: Self-pay | Admitting: Endocrinology

## 2022-11-11 DIAGNOSIS — I10 Essential (primary) hypertension: Secondary | ICD-10-CM

## 2022-11-19 ENCOUNTER — Other Ambulatory Visit: Payer: Self-pay | Admitting: Endocrinology

## 2022-11-20 DIAGNOSIS — E1165 Type 2 diabetes mellitus with hyperglycemia: Secondary | ICD-10-CM | POA: Diagnosis not present

## 2022-12-10 NOTE — Progress Notes (Signed)
Office Visit Note  Patient: Marissa Roberts             Date of Birth: 05/21/1945           MRN: [id number]             PCP: Myrlene Broker, MD Referring: Myrlene Broker, * Visit Date: 12/19/2022 Occupation: @GUAROCC @  Subjective:  Medication management   History of Present Illness: ARYAL JACOBOWITZ is a 77 y.o. female with seropositive rheumatoid arthritis and osteoarthritis.  She states she continues to have some pain and stiffness in her bilateral hands.  She has a stiffness in her left hand and has difficulty making a fist.  She has been taking leflunomide 20 mg p.o. daily.  She had no interruption in the treatment since the last visit.  She continues to have some discomfort in the left trochanteric bursa.  Left total knee replacement continues to cause some discomfort.  She uses a cane for ambulation.  She does not have much discomfort in her feet currently.  She had a DEXA scan on April 23, 2021 which was within normal limits.    Activities of Daily Living:  Patient reports morning stiffness for several hours.   Patient Reports nocturnal pain.  Difficulty dressing/grooming: Denies Difficulty climbing stairs: Reports Difficulty getting out of chair: Reports Difficulty using hands for taps, buttons, cutlery, and/or writing: Reports  Review of Systems  Constitutional:  Negative for fatigue.  HENT:  Negative for mouth sores and mouth dryness.   Eyes:  Negative for dryness.  Respiratory:  Negative for shortness of breath.   Cardiovascular:  Negative for chest pain and palpitations.  Gastrointestinal:  Negative for blood in stool, constipation and diarrhea.  Endocrine: Negative for increased urination.  Genitourinary:  Negative for involuntary urination.  Musculoskeletal:  Positive for joint pain, joint pain, joint swelling and morning stiffness. Negative for gait problem, myalgias, muscle weakness, muscle tenderness and myalgias.  Skin:  Negative for color  change, rash, hair loss and sensitivity to sunlight.  Allergic/Immunologic: Negative for susceptible to infections.  Neurological:  Negative for dizziness and headaches.  Hematological:  Negative for swollen glands.  Psychiatric/Behavioral:  Negative for depressed mood and sleep disturbance. The patient is not nervous/anxious.     PMFS History:  Patient Active Problem List   Diagnosis Date Noted   Lumbar compression fracture (HCC) 03/29/2022   Anxiety 02/25/2022   Low back pain 02/25/2022   Generalized subcutaneous nodules 11/22/2021   Displaced transverse fracture of left patella, subsequent encounter for closed fracture with delayed healing    Pain in knee region after total knee replacement (HCC) 03/22/2020   Arthritis of left knee 07/01/2019   Arthritis 04/20/2019   B12 deficiency 03/23/2019   Anemia 02/05/2019   Rheumatoid arthritis with rheumatoid factor of multiple sites without organ or systems involvement (HCC) 01/25/2019   High risk medication use 01/25/2019   Fatigue 09/18/2018   Encounter for general adult medical examination with abnormal findings 02/07/2015   Hyperlipidemia associated with type 2 diabetes mellitus (HCC) 07/28/2009   Osteoporosis 03/14/2009   Diabetes mellitus type 2 with complications (HCC) 11/14/2006   Hypothyroidism 09/24/2006   Essential hypertension 09/24/2006    Past Medical History:  Diagnosis Date   Anemia    Arthritis    Bronchitis    hx of   Diabetes mellitus    type 2   Family history of adverse reaction to anesthesia    per patient, "daughter threw up  after a knee surgery"   GERD (gastroesophageal reflux disease)    Hypercholesteremia    Hypertension    Hypothyroidism    Pneumonia    hx of   PONV (postoperative nausea and vomiting)    Thyroid disease     Family History  Problem Relation Age of Onset   Diabetes Mother    Heart disease Mother    Hyperlipidemia Mother    Diabetes Father    Diabetes Sister    Hypertension  Sister    Heart disease Sister    Dementia Sister    Heart disease Brother    Healthy Daughter    Healthy Daughter    Healthy Daughter    Healthy Son    Past Surgical History:  Procedure Laterality Date   ABDOMINAL HYSTERECTOMY     partial   BREAST SURGERY     lumpectomy left breast   carpel tunnel     COLONOSCOPY  07/2020   COLONOSCOPY W/ POLYPECTOMY     FOOT SURGERY     knee arhtroscopy     KNEE ARTHROPLASTY Left 05/2020   KYPHOPLASTY N/A 04/08/2022   Procedure: KYPHOPLASTY L1;  Surgeon: Venita Lick, MD;  Location: [facility] OR;  Service: Orthopedics;  Laterality: N/A;  Local with [facility]   ORIF PATELLA Left 05/23/2020   Procedure: LEFT KNEE POLE PATELLA EXCISION;  Surgeon: Cammy Copa, MD;  Location: [facility] OR;  Service: Orthopedics;  Laterality: Left;   SHOULDER ARTHROSCOPY WITH SUBACROMIAL DECOMPRESSION, ROTATOR CUFF REPAIR AND BICEP TENDON REPAIR Right 05/26/2012   Procedure: Right Shoulder Diagnostic Operative Arthroscopy, Subacromial Decompression, Biceps Tenodesis, Mini Open Rotator Cuff Repair;  Surgeon: Cammy Copa, MD;  Location: [facility] OR;  Service: Orthopedics;  Laterality: Right;  Right Shoulder Diagnostic Operative Arthroscopy, Subacromial Decompression, Biceps Tenodesis, Mini Open Rotator Cuff Repair   TONSILLECTOMY     TOTAL KNEE ARTHROPLASTY Left 07/01/2019   Procedure: LEFT TOTAL KNEE ARTHROPLASTY;  Surgeon: Cammy Copa, MD;  Location: [facility] OR;  Service: Orthopedics;  Laterality: Left;   UPPER GI ENDOSCOPY  07/2020   Social History   Social History Narrative   Not on file   Immunization History  Administered Date(s) Administered   COVID-19, mRNA, vaccine(Comirnaty)12 years and older 01/08/2022   Fluad Quad(high Dose 65+) 01/02/2019, 12/31/2019   Influenza Split 01/03/2011   Influenza Whole 04/01/2005, 12/18/2009   Influenza, High Dose Seasonal PF 01/07/2014, 01/27/2015, 01/02/2016, 01/13/2017, 01/16/2018   Influenza-Unspecified 11/30/2012,  01/03/2021, 01/08/2022   PFIZER(Purple Top)SARS-COV-2 Vaccination 05/28/2019, 06/23/2019, 01/15/2020, 07/03/2020, 01/03/2021   Pneumococcal Conjugate-13 09/12/2016, 01/02/2019   Pneumococcal Polysaccharide-23 07/15/2014, 12/27/2019   Respiratory Syncytial Virus Vaccine,Recomb Aduvanted(Arexvy) 01/08/2022   Td 04/01/2001, 10/07/2013   Zoster Recombinant(Shingrix) 01/09/2021, 03/12/2021     Objective: Vital Signs: BP (!) 150/81 (BP Location: Right Arm, Patient Position: Sitting, Cuff Size: Normal)   Pulse 77   Resp 15   Ht 5\' 5"  (1.651 m)   Wt 188 lb 6.4 oz (85.5 kg)   BMI 31.35 kg/m    Physical Exam Vitals and nursing note reviewed.  Constitutional:      Appearance: She is well-developed.  HENT:     Head: Normocephalic and atraumatic.  Eyes:     Conjunctiva/sclera: Conjunctivae normal.  Cardiovascular:     Rate and Rhythm: Normal rate and regular rhythm.     Heart sounds: Normal heart sounds.  Pulmonary:     Effort: Pulmonary effort is normal.     Breath sounds: Normal breath sounds.  Abdominal:  General: Bowel sounds are normal.     Palpations: Abdomen is soft.  Musculoskeletal:     Cervical back: Normal range of motion.  Lymphadenopathy:     Cervical: No cervical adenopathy.  Skin:    General: Skin is warm and dry.     Capillary Refill: Capillary refill takes less than 2 seconds.  Neurological:     Mental Status: She is alert and oriented to person, place, and time.  Psychiatric:        Behavior: Behavior normal.      Musculoskeletal Exam: She had good range of motion of the cervical spine.  Thoracic kyphosis was noted.  She had discomfort range of motion of the lumbar spine.  Shoulder joints, elbow joints, wrist joints were in good range of motion.  There was no synovitis over MCPs or wrist joints.  She had bilateral PIP and DIP thickening.  Hip joints were in good range of motion.  Left knee joint was replaced and had discomfort with range of motion.  Right knee  joint was in good range of motion.  There was no tenderness over ankles or MTPs.  CDAI Exam: CDAI Score: -- Patient Global: 10 / 100; Provider Global: 10 / 100 Swollen: --; Tender: -- Joint Exam 12/19/2022   No joint exam has been documented for this visit   There is currently no information documented on the homunculus. Go to the Rheumatology activity and complete the homunculus joint exam.  Investigation: No additional findings.  Imaging: No results found.  Recent Labs: Lab Results  Component Value Date   WBC 9.4 10/28/2022   HGB 11.2 (L) 10/28/2022   PLT 291 10/28/2022   NA 142 10/28/2022   K 4.1 10/28/2022   CL 105 10/28/2022   CO2 28 10/28/2022   GLUCOSE 106 (H) 10/28/2022   BUN 11 10/28/2022   CREATININE 0.60 10/28/2022   BILITOT 0.3 10/28/2022   ALKPHOS 45 07/30/2021   AST 13 10/28/2022   ALT 16 10/28/2022   PROT 6.3 10/28/2022   ALBUMIN 4.0 07/30/2021   CALCIUM 9.6 10/28/2022   GFRAA 101 09/18/2020   QFTBGOLDPLUS NEGATIVE 01/09/2021    Speciality Comments: PLQ Eye Exam: 12/22/2019 WNL @ [facility] follow up in 6 months  Methotrexate-hair loss  Procedures:  No procedures performed Allergies: Contrast media [iodinated contrast media], Adhesive [tape], Erythromycin, and Penicillins   Assessment / Plan:     Visit Diagnoses: Rheumatoid arthritis involving multiple sites with positive rheumatoid factor (HCC)-patient had no synovitis on the examination.  She continues to have some stiffness in her hands.  She has been taking leflunomide 20 mg daily without any interruption.  Will continue current treatment.  High risk medication use - Arava 20 mg 1 tablet by mouth daily.  Labs obtained in July were reviewed which were within normal limits except for low hemoglobin of 11.2 which has been stable.  She was advised to get labs in October and every 3 months.  Information on immunization was placed in the AVS.  She was advised to hold leflunomide if she develops an  infection and resume after the infection resolves.  Primary osteoarthritis of both hands-she had bilateral PIP and DIP thickening.  No synovitis was noted.  Joint protection muscle strengthening was discussed.  Trochanteric bursitis, left hip -she has intermittent pain.  She had no tenderness over the trochanteric region today.  S/P total knee replacement, left -she has chronic pain in her left knee.  She had left total knee  replacement performed by Dr. August Saucer on 07/01/19.  Bone scan of both lower extremity performed on 11/30/2020.  Primary osteoarthritis of both feet-she has off-and-on discomfort.  No synovitis was noted on the examination today.  History of osteopenia - DEXA: 04/23/2021 T-score: -0.6, BMD: 1.009  AP Spine which was within normal limits.  History of vertebral fracture - L1 compression fracture after a fall in January 2024.  She underwent a kyphoplasty on 04/08/2022.  Chronic lower back pain without sciatica-I offered physical therapy which she declined.  She had no point tenderness.  A handout on back exercises was given.  Vitamin D deficiency-she is on vitamin D supplement.  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 Maple [facility] neurology.  Essential hypertension-blood pressure was elevated at 150/81.  Patient was advised to monitor blood pressure closely and follow-up with the PCP.  Hyperlipidemia associated with type 2 diabetes mellitus (HCC)  History of hypothyroidism  Diabetes mellitus type 2 with complications (HCC)  Orders: No orders of the defined types were placed in this encounter.  No orders of the defined types were placed in this encounter.    Follow-Up Instructions: Return in about 5 months (around 05/21/2023) for Rheumatoid arthritis,  Osteoarthritis.   Pollyann Savoy, MD  Note - This record has been created using Animal nutritionist.  Chart creation errors have been sought, but may not always  have been located. Such creation errors do not reflect on  the standard of medical care.

## 2022-12-17 DIAGNOSIS — M48061 Spinal stenosis, lumbar region without neurogenic claudication: Secondary | ICD-10-CM | POA: Diagnosis not present

## 2022-12-17 DIAGNOSIS — M4856XA Collapsed vertebra, not elsewhere classified, lumbar region, initial encounter for fracture: Secondary | ICD-10-CM | POA: Diagnosis not present

## 2022-12-17 DIAGNOSIS — M5416 Radiculopathy, lumbar region: Secondary | ICD-10-CM | POA: Diagnosis not present

## 2022-12-17 DIAGNOSIS — M5451 Vertebrogenic low back pain: Secondary | ICD-10-CM | POA: Diagnosis not present

## 2022-12-19 ENCOUNTER — Ambulatory Visit: Payer: Medicare Other | Attending: Rheumatology | Admitting: Rheumatology

## 2022-12-19 ENCOUNTER — Encounter: Payer: Self-pay | Admitting: Rheumatology

## 2022-12-19 VITALS — BP 150/81 | HR 77 | Resp 15 | Ht 65.0 in | Wt 188.4 lb

## 2022-12-19 DIAGNOSIS — M7062 Trochanteric bursitis, left hip: Secondary | ICD-10-CM | POA: Diagnosis not present

## 2022-12-19 DIAGNOSIS — Z79899 Other long term (current) drug therapy: Secondary | ICD-10-CM | POA: Diagnosis not present

## 2022-12-19 DIAGNOSIS — G8929 Other chronic pain: Secondary | ICD-10-CM

## 2022-12-19 DIAGNOSIS — M19042 Primary osteoarthritis, left hand: Secondary | ICD-10-CM

## 2022-12-19 DIAGNOSIS — E118 Type 2 diabetes mellitus with unspecified complications: Secondary | ICD-10-CM

## 2022-12-19 DIAGNOSIS — E785 Hyperlipidemia, unspecified: Secondary | ICD-10-CM

## 2022-12-19 DIAGNOSIS — M19041 Primary osteoarthritis, right hand: Secondary | ICD-10-CM | POA: Diagnosis not present

## 2022-12-19 DIAGNOSIS — Z8739 Personal history of other diseases of the musculoskeletal system and connective tissue: Secondary | ICD-10-CM

## 2022-12-19 DIAGNOSIS — M545 Low back pain, unspecified: Secondary | ICD-10-CM

## 2022-12-19 DIAGNOSIS — E1169 Type 2 diabetes mellitus with other specified complication: Secondary | ICD-10-CM

## 2022-12-19 DIAGNOSIS — E1144 Type 2 diabetes mellitus with diabetic amyotrophy: Secondary | ICD-10-CM

## 2022-12-19 DIAGNOSIS — M19072 Primary osteoarthritis, left ankle and foot: Secondary | ICD-10-CM

## 2022-12-19 DIAGNOSIS — Z8781 Personal history of (healed) traumatic fracture: Secondary | ICD-10-CM

## 2022-12-19 DIAGNOSIS — Z8639 Personal history of other endocrine, nutritional and metabolic disease: Secondary | ICD-10-CM

## 2022-12-19 DIAGNOSIS — M19071 Primary osteoarthritis, right ankle and foot: Secondary | ICD-10-CM

## 2022-12-19 DIAGNOSIS — M65332 Trigger finger, left middle finger: Secondary | ICD-10-CM

## 2022-12-19 DIAGNOSIS — I10 Essential (primary) hypertension: Secondary | ICD-10-CM

## 2022-12-19 DIAGNOSIS — M0579 Rheumatoid arthritis with rheumatoid factor of multiple sites without organ or systems involvement: Secondary | ICD-10-CM | POA: Diagnosis not present

## 2022-12-19 DIAGNOSIS — E559 Vitamin D deficiency, unspecified: Secondary | ICD-10-CM

## 2022-12-19 DIAGNOSIS — Z96652 Presence of left artificial knee joint: Secondary | ICD-10-CM

## 2022-12-19 NOTE — Patient Instructions (Addendum)
Standing Labs We placed an order today for your standing lab work.   Please have your standing labs drawn in October and every 3 months  Please have your labs drawn 2 weeks prior to your appointment so that the provider can discuss your lab results at your appointment, if possible.  Please note that you may see your imaging and lab results in MyChart before we have reviewed them. We will contact you once all results are reviewed. Please allow our office up to 72 hours to thoroughly review all of the results before contacting the office for clarification of your results.  WALK-IN LAB HOURS  Monday through Thursday from 8:00 am -12:30 pm and 1:00 pm-5:00 pm and Friday from 8:00 am-12:00 pm.  Patients with office visits requiring labs will be seen before walk-in labs.  You may encounter longer than normal wait times. Please allow additional time. Wait times may be shorter on  Monday and Thursday afternoons.  We do not book appointments for walk-in labs. We appreciate your patience and understanding with our staff.   Labs are drawn by [facility]. Please bring your co-pay at the time of your lab draw.  You may receive a bill from [facility] for your lab work.  Please note if you are on Hydroxychloroquine and and an order has been placed for a Hydroxychloroquine level,  you will need to have it drawn 4 hours or more after your last dose.  If you wish to have your labs drawn at another location, please call the office 24 hours in advance so we can fax the orders.  The office is located at [address], [address], [place], [place] [address]   If you have any questions regarding directions or hours of operation,  please call [phone].   As a reminder, please drink plenty of water prior to coming for your lab work. Thanks!   Vaccines You are taking a medication(s) that can suppress your immune system.  The following immunizations are recommended: Flu annually Covid-19  RSV Td/Tdap (tetanus,  diphtheria, pertussis) every 10 years Pneumonia (Prevnar 15 then Pneumovax 23 at least 1 year apart.  Alternatively, can take Prevnar 20 without needing additional dose) Shingrix: 2 doses from 4 weeks to 6 months apart  Please check with your PCP to make sure you are up to date.   If you have signs or symptoms of an infection or start antibiotics: First, call your PCP for workup of your infection. Hold your medication through the infection, until you complete your antibiotics, and until symptoms resolve if you take the following: Injectable medication (Actemra, Benlysta, Cimzia, Cosentyx, Enbrel, Humira, Kevzara, Orencia, Remicade, Simponi, Stelara, Taltz, Tremfya) Methotrexate Leflunomide (Arava) Mycophenolate (Cellcept) Osborne [place], or Rinvoq    Low Back Sprain or Strain Rehab Ask your health care provider which exercises are safe for you. Do exercises exactly as told by your health care provider and adjust them as directed. It is normal to feel mild stretching, pulling, tightness, or discomfort as you do these exercises. Stop right away if you feel sudden pain or your pain gets worse. Do not begin these exercises until told by your health care provider. Stretching and range-of-motion exercises These exercises warm up your muscles and joints and improve the movement and flexibility of your back. These exercises also help to relieve pain, numbness, and tingling. Lumbar rotation  Lie on your back on a firm bed or the floor with your knees bent. Straighten your arms out to your sides so  each arm forms a 90-degree angle (right angle) with a side of your body. Slowly move (rotate) both of your knees to one side of your body until you feel a stretch in your lower back (lumbar). Try not to let your shoulders lift off the floor. Hold this position for __________ seconds. Tense your abdominal muscles and slowly move your knees back to the starting position. Repeat this exercise on the other  side of your body. Repeat __________ times. Complete this exercise __________ times a day. Single knee to chest  Lie on your back on a firm bed or the floor with both legs straight. Bend one of your knees. Use your hands to move your knee up toward your chest until you feel a gentle stretch in your lower back and buttock. Hold your leg in this position by holding on to the front of your knee. Keep your other leg as straight as possible. Hold this position for __________ seconds. Slowly return to the starting position. Repeat with your other leg. Repeat __________ times. Complete this exercise __________ times a day. Prone extension on elbows  Lie on your abdomen on a firm bed or the floor (prone position). Prop yourself up on your elbows. Use your arms to help lift your chest up until you feel a gentle stretch in your abdomen and your lower back. This will place some of your body weight on your elbows. If this is uncomfortable, try stacking pillows under your chest. Your hips should stay down, against the surface that you are lying on. Keep your hip and back muscles relaxed. Hold this position for __________ seconds. Slowly relax your upper body and return to the starting position. Repeat __________ times. Complete this exercise __________ times a day. Strengthening exercises These exercises build strength and endurance in your back. Endurance is the ability to use your muscles for a long time, even after they get tired. Pelvic tilt This exercise strengthens the muscles that lie deep in the abdomen. Lie on your back on a firm bed or the floor with your legs extended. Bend your knees so they are pointing toward the ceiling and your feet are flat on the floor. Tighten your lower abdominal muscles to press your lower back against the floor. This motion will tilt your pelvis so your tailbone points up toward the ceiling instead of pointing to your feet or the floor. To help with this  exercise, you may place a small towel under your lower back and try to push your back into the towel. Hold this position for __________ seconds. Let your muscles relax completely before you repeat this exercise. Repeat __________ times. Complete this exercise __________ times a day. Alternating arm and leg raises  Get on your hands and knees on a firm surface. If you are on a hard floor, you may want to use padding, such as an exercise mat, to cushion your knees. Line up your arms and legs. Your hands should be directly below your shoulders, and your knees should be directly below your hips. Lift your left leg behind you. At the same time, raise your right arm and straighten it in front of you. Do not lift your leg higher than your hip. Do not lift your arm higher than your shoulder. Keep your abdominal and back muscles tight. Keep your hips facing the ground. Do not arch your back. Keep your balance carefully, and do not hold your breath. Hold this position for __________ seconds. Slowly return to the starting  position. Repeat with your right leg and your left arm. Repeat __________ times. Complete this exercise __________ times a day. Abdominal set with straight leg raise  Lie on your back on a firm bed or the floor. Bend one of your knees and keep your other leg straight. Tense your abdominal muscles and lift your straight leg up, 4-6 inches (10-15 cm) off the ground. Keep your abdominal muscles tight and hold this position for __________ seconds. Do not hold your breath. Do not arch your back. Keep it flat against the ground. Keep your abdominal muscles tense as you slowly lower your leg back to the starting position. Repeat with your other leg. Repeat __________ times. Complete this exercise __________ times a day. Single leg lower with bent knees Lie on your back on a firm bed or the floor. Tense your abdominal muscles and lift your feet off the floor, one foot at a time, so  your knees and hips are bent in 90-degree angles (right angles). Your knees should be over your hips and your lower legs should be parallel to the floor. Keeping your abdominal muscles tense and your knee bent, slowly lower one of your legs so your toe touches the ground. Lift your leg back up to return to the starting position. Do not hold your breath. Do not let your back arch. Keep your back flat against the ground. Repeat with your other leg. Repeat __________ times. Complete this exercise __________ times a day. Posture and body mechanics Good posture and healthy body mechanics can help to relieve stress in your body's tissues and joints. Body mechanics refers to the movements and positions of your body while you do your daily activities. Posture is part of body mechanics. Good posture means: Your spine is in its natural S-curve position (neutral). Your shoulders are pulled back slightly. Your head is not tipped forward (neutral). Follow these guidelines to improve your posture and body mechanics in your everyday activities. Standing  When standing, keep your spine neutral and your feet about hip-width apart. Keep a slight bend in your knees. Your ears, shoulders, and hips should line up. When you do a task in which you stand in one place for a long time, place one foot up on a stable object that is 2-4 inches (5-10 cm) high, such as a footstool. This helps keep your spine neutral. Sitting  When sitting, keep your spine neutral and keep your feet flat on the floor. Use a footrest, if necessary, and keep your thighs parallel to the floor. Avoid rounding your shoulders, and avoid tilting your head forward. When working at a desk or a computer, keep your desk at a height where your hands are slightly lower than your elbows. Slide your chair under your desk so you are close enough to maintain good posture. When working at a computer, place your monitor at a height where you are looking  straight ahead and you do not have to tilt your head forward or downward to look at the screen. Resting When lying down and resting, avoid positions that are most painful for you. If you have pain with activities such as sitting, bending, stooping, or squatting, lie in a position in which your body does not bend very much. For example, avoid curling up on your side with your arms and knees near your chest (fetal position). If you have pain with activities such as standing for a long time or reaching with your arms, lie with your spine in a  neutral position and bend your knees slightly. Try the following positions: Lying on your side with a pillow between your knees. Lying on your back with a pillow under your knees. Lifting  When lifting objects, keep your feet at least shoulder-width apart and tighten your abdominal muscles. Bend your knees and hips and keep your spine neutral. It is important to lift using the strength of your legs, not your back. Do not lock your knees straight out. Always ask for help to lift heavy or awkward objects. This information is not intended to replace advice given to you by your health care provider. Make sure you discuss any questions you have with your health care provider. Document Revised: 07/22/2022 Document Reviewed: 06/05/2020 Elsevier Patient Education  2024 [employer].

## 2022-12-29 ENCOUNTER — Other Ambulatory Visit: Payer: Self-pay | Admitting: Internal Medicine

## 2022-12-31 ENCOUNTER — Ambulatory Visit (INDEPENDENT_AMBULATORY_CARE_PROVIDER_SITE_OTHER): Payer: Medicare Other | Admitting: Podiatry

## 2022-12-31 ENCOUNTER — Encounter: Payer: Self-pay | Admitting: Podiatry

## 2022-12-31 DIAGNOSIS — M79674 Pain in right toe(s): Secondary | ICD-10-CM | POA: Diagnosis not present

## 2022-12-31 DIAGNOSIS — B351 Tinea unguium: Secondary | ICD-10-CM | POA: Diagnosis not present

## 2022-12-31 DIAGNOSIS — M79675 Pain in left toe(s): Secondary | ICD-10-CM | POA: Diagnosis not present

## 2022-12-31 DIAGNOSIS — E119 Type 2 diabetes mellitus without complications: Secondary | ICD-10-CM

## 2022-12-31 DIAGNOSIS — Q828 Other specified congenital malformations of skin: Secondary | ICD-10-CM | POA: Diagnosis not present

## 2022-12-31 DIAGNOSIS — Z794 Long term (current) use of insulin: Secondary | ICD-10-CM

## 2023-01-02 NOTE — Progress Notes (Signed)
Subjective:  Patient ID: Marissa Roberts, female    DOB: [birth year],  MRN: [id number]  Daleen Snook Dragone presents to clinic today for: follow up diabetic foot care and painful porokeratotic lesion(s) right lower extremity and painful mycotic toenails that limit ambulation. Painful toenails interfere with ambulation. Aggravating factors include wearing enclosed shoe gear. Pain is relieved with periodic professional debridement. Painful porokeratotic lesions are aggravated when weightbearing with and without shoegear. Pain is relieved with periodic professional debridement.  Chief Complaint  Patient presents with   Diabetes    DFC BS-110 A1C-5.9 PCPV-09/2022    PCP is Myrlene Broker, MD.  Allergies  Allergen Reactions   Contrast Media [Iodinated Contrast Media]     Throat swells from ct contra   Adhesive [Tape]     No paper tape, causes blisters   Erythromycin Rash   Penicillins Rash    Did it involve swelling of the face/tongue/throat, SOB, or low BP? No Did it involve sudden or severe rash/hives, skin peeling, or any reaction on the inside of your mouth or nose? No Did you need to seek medical attention at a hospital or doctor's office? No When did it last happen?    Many years If all above answers are "NO", may proceed with cephalosporin use.     Review of Systems: Negative except as noted in the HPI.  Objective: No changes noted in today's physical examination. There were no vitals filed for this visit.  [name] is a pleasant 77 y.o. female in NAD. AAO x 3.  Vascular Examination: Capillary refill time <3 seconds b/l LE. Palpable pedal pulses b/l LE. Digital hair present b/l. No pedal edema b/l. Skin temperature gradient WNL b/l. No varicosities b/l. No pain with calf compression b/l.Marland Kitchen  Dermatological Examination: Pedal skin with normal turgor, texture and tone b/l. No open wounds. No interdigital macerations b/l. Toenails 1-5 b/l thickened, discolored,  dystrophic with subungual debris. There is pain on palpation to dorsal aspect of nailplates. Porokeratotic lesion(s) distal tip of right 4th toe and L 5th toe. No erythema, no edema, no drainage, no fluctuance..  Neurological Examination: Protective sensation intact with 10 gram monofilament b/l LE. Vibratory sensation intact b/l LE. Pt has subjective symptoms of neuropathy.  Musculoskeletal Examination: Muscle strength 5/5 to all lower extremity muscle groups bilaterally. Hammertoe(s) noted to the bilateral 5th toes.     Latest Ref Rng & Units 10/01/2022    8:22 AM 07/05/2022    9:02 AM 03/05/2022    8:51 AM  Hemoglobin A1C  Hemoglobin-A1c 4.6 - 6.5 % 5.7  5.5  6.6    Assessment/Plan: 1. Pain due to onychomycosis of toenails of both feet   2. Porokeratosis   3. Type 2 diabetes mellitus without complication, with long-term current use of insulin (HCC)     -Patient was evaluated and treated. All patient's and/or POA's questions/concerns answered on today's visit. -Patient to continue soft, supportive shoe gear daily. -Mycotic toenails 1-5 bilaterally were debrided in length and girth with sterile nail nippers and dremel without incident. -Porokeratotic lesion(s) L 5th toe and R 4th toe pared and enucleated with sterile currette without incident. Total number of lesions debrided=2. -Patient/POA to call should there be question/concern in the interim.   Return in about 9 weeks (around 03/04/2023).  Freddie Breech, DPM

## 2023-01-07 DIAGNOSIS — Z5181 Encounter for therapeutic drug level monitoring: Secondary | ICD-10-CM | POA: Diagnosis not present

## 2023-01-07 DIAGNOSIS — Z79899 Other long term (current) drug therapy: Secondary | ICD-10-CM | POA: Diagnosis not present

## 2023-01-07 DIAGNOSIS — M5416 Radiculopathy, lumbar region: Secondary | ICD-10-CM | POA: Diagnosis not present

## 2023-01-08 ENCOUNTER — Other Ambulatory Visit: Payer: Self-pay

## 2023-01-08 DIAGNOSIS — E118 Type 2 diabetes mellitus with unspecified complications: Secondary | ICD-10-CM

## 2023-01-08 MED ORDER — TRESIBA FLEXTOUCH 200 UNIT/ML ~~LOC~~ SOPN
PEN_INJECTOR | SUBCUTANEOUS | 0 refills | Status: DC
Start: 2023-01-08 — End: 2023-03-04

## 2023-01-08 NOTE — Telephone Encounter (Signed)
Marissa Roberts refill request complete

## 2023-01-09 DIAGNOSIS — Z1211 Encounter for screening for malignant neoplasm of colon: Secondary | ICD-10-CM | POA: Diagnosis not present

## 2023-01-09 DIAGNOSIS — E119 Type 2 diabetes mellitus without complications: Secondary | ICD-10-CM | POA: Diagnosis not present

## 2023-01-09 DIAGNOSIS — K648 Other hemorrhoids: Secondary | ICD-10-CM | POA: Diagnosis not present

## 2023-01-16 DIAGNOSIS — E119 Type 2 diabetes mellitus without complications: Secondary | ICD-10-CM | POA: Diagnosis not present

## 2023-01-16 LAB — HM DIABETES EYE EXAM

## 2023-01-21 ENCOUNTER — Ambulatory Visit (INDEPENDENT_AMBULATORY_CARE_PROVIDER_SITE_OTHER): Payer: Medicare Other

## 2023-01-21 VITALS — BP 124/70 | HR 82 | Temp 98.3°F | Ht 65.0 in | Wt 186.8 lb

## 2023-01-21 DIAGNOSIS — Z0000 Encounter for general adult medical examination without abnormal findings: Secondary | ICD-10-CM | POA: Diagnosis not present

## 2023-01-21 NOTE — Patient Instructions (Addendum)
Marissa Roberts , Thank you for taking time to come for your Medicare Wellness Visit. I appreciate your ongoing commitment to your health goals. Please review the following plan we discussed and let me know if I can assist you in the future.   Referrals/Orders/Follow-Ups/Clinician Recommendations: No  This is a list of the screening recommended for you and due dates:  Health Maintenance  Topic Date Due   Eye exam for diabetics  10/05/2022   Medicare Annual Wellness Visit  01/17/2023   COVID-19 Vaccine (8 - 2023-24 season) 02/18/2023   Hemoglobin A1C  04/03/2023   Yearly kidney health urinalysis for diabetes  07/05/2023   Complete foot exam   08/21/2023   Colon Cancer Screening  08/24/2023   DTaP/Tdap/Td vaccine (3 - Tdap) 10/08/2023   Yearly kidney function blood test for diabetes  10/28/2023   Pneumonia Vaccine  Completed   Flu Shot  Completed   DEXA scan (bone density measurement)  Completed   Hepatitis C Screening  Completed   Zoster (Shingles) Vaccine  Completed   HPV Vaccine  Aged Out   Conditions/risks identified: Yes; Reviewed health maintenance screenings with patient today and relevant education, vaccines, and/or referrals were provided. Please continue to do your personal lifestyle choices by: daily care of teeth and gums, regular physical activity (goal should be 5 days a week for 30 minutes), eat a healthy diet, avoid tobacco and drug use, limiting any alcohol intake, taking a low-dose aspirin (if not allergic or have been advised by your provider otherwise) and taking vitamins and minerals as recommended by your provider. Continue doing brain stimulating activities (puzzles, reading, adult coloring books, staying active) to keep memory sharp. Continue to eat heart healthy diet (full of fruits, vegetables, whole grains, lean protein, water--limit salt, fat, and sugar intake) and increase physical activity as tolerated.  Advanced directives: (Copy Requested) Please bring a copy of  your health care power of attorney and living will to the office to be added to your chart at your convenience.  Next Medicare Annual Wellness Visit scheduled for next year: No

## 2023-01-21 NOTE — Progress Notes (Signed)
Subjective:   Marissa Roberts is a 77 y.o. female who presents for Medicare Annual (Subsequent) preventive examination.  Visit Complete: In person  Patient Medicare AWV questionnaire was completed by the patient on 01/17/2023; I have confirmed that all information answered by patient is correct and no changes since this date.  Cardiac Risk Factors include: advanced age (>72men, >79 women)     Objective:    Today's Vitals   01/21/23 1008 01/21/23 1016  BP: 124/70   Pulse: 82   Temp: 98.3 F (36.8 C)   TempSrc: Temporal   SpO2: 97%   Weight: 186 lb 12.8 oz (84.7 kg)   Height: 5\' 5"  (1.651 m)   PainSc: 8  8   PainLoc: Back    Body mass index is 31.09 kg/m.     01/21/2023   10:18 AM 04/08/2022    1:42 PM 01/16/2022    9:34 AM 01/09/2021    9:43 AM 05/23/2020    1:17 PM 05/16/2020    2:53 PM 07/16/2019   11:39 AM  Advanced Directives  Does Patient Have a Medical Advance Directive? Yes Yes Yes Yes Yes No Yes  Type of Estate agent of [place];Living will Healthcare Power of [employer] of [place];Living will  Living will  Healthcare Power of Attorney  Does patient want to make changes to medical advance directive?    No - Patient declined No - Patient declined    Copy of Healthcare Power of Attorney in Chart? No - copy requested No - copy requested No - copy requested      Would patient like information on creating a medical advance directive?    No - Patient declined No - Patient declined No - Patient declined     Current Medications (verified) Outpatient Encounter Medications as of 01/21/2023  Medication Sig   acetaminophen (TYLENOL) 325 MG tablet Take 2 tablets (650 mg total) by mouth every 6 (six) hours as needed for moderate pain.   amLODipine (NORVASC) 10 MG tablet Take 1 tablet by mouth once daily   Biotin 16109 MCG TABS Take 10,000 mcg by mouth daily.   Calcium Carb-Cholecalciferol (CALCIUM 600 + D PO) Take 1 tablet by mouth  daily.   Continuous Blood Gluc Receiver (DEXCOM G6 RECEIVER) DEVI For sensor monitoring   Continuous Blood Gluc Sensor (DEXCOM G6 SENSOR) MISC Use to monitor blood sugar, change after 10 days   Continuous Blood Gluc Transmit (DEXCOM G6 TRANSMITTER) MISC 4 PER YEAR   diclofenac Sodium (VOLTAREN) 1 % GEL Apply topically as needed.   FARXIGA 5 MG TABS tablet Take 1 tablet by mouth once daily   fluticasone (FLONASE) 50 MCG/ACT nasal spray Place 2 sprays into both nostrils 2 (two) times daily as needed for allergies or rhinitis.   furosemide (LASIX) 40 MG tablet Take 2 tablets by mouth once daily   Glucosamine HCl-MSM (GLUCOSAMINE-MSM PO) Take 1 tablet by mouth daily.   glucose blood (ONETOUCH VERIO) test strip USE 1 STRIP TO CHECK GLUCOSE THREE TIMES DAILY 2 HOURS AFTER A MEAL AND 1 THREE TIMES A WEEK UPON WAKING UP   Insulin Pen Needle (BD PEN NEEDLE NANO U/F) 32G X 4 MM MISC USE 1  4 TIMES DAILY TO  INJECT  INSULIN   ketoconazole (NIZORAL) 2 % cream Apply to both feet and between toes once daily for 6 weeks.   latanoprost (XALATAN) 0.005 % ophthalmic solution SMARTSIG:1 Drop(s) In Eye(s) Every Evening   leflunomide (ARAVA) 20 MG  tablet Take 1 tablet by mouth once daily   levothyroxine (SYNTHROID) 112 MCG tablet TAKE 1 TABLET BY MOUTH BEFORE BREAKFAST   LINZESS 290 MCG CAPS capsule TAKE 1 CAPSULE BY MOUTH ONCE DAILY AS NEEDED   Magnesium 100 MG CAPS Take 100 mg by mouth daily.   metFORMIN (GLUCOPHAGE) 1000 MG tablet TAKE 1 TABLET BY MOUTH TWICE DAILY WITH A MEAL   montelukast (SINGULAIR) 10 MG tablet TAKE 1 TABLET BY MOUTH AT BEDTIME   NOVOLOG FLEXPEN 100 UNIT/ML FlexPen INJECT 10 TO 15 UNITS SUBCUTANEOUSLY THREE TIMES DAILY BEFORE MEAL(S)   omeprazole (PRILOSEC) 40 MG capsule Take 40 mg by mouth daily.   ondansetron (ZOFRAN) 4 MG tablet Take 1 tablet (4 mg total) by mouth every 8 (eight) hours as needed for nausea or vomiting.   OneTouch Delica Lancets 33G MISC USE TO CHECK BLOOD SUGAR UP TO  THREE TIMES A DAY, 2 HOURS AFTER A MEAL AND UP TO 3 TIMES A WEEK UPON WAKING UP   potassium chloride (KLOR-CON) 10 MEQ tablet Take 1 tablet by mouth once daily   rosuvastatin (CRESTOR) 40 MG tablet Take 1 tablet by mouth once daily   traMADol (ULTRAM) 50 MG tablet Take 50 mg by mouth every 6 (six) hours as needed.   TRESIBA FLEXTOUCH 200 UNIT/ML FlexTouch Pen INJECT 32 UNITS SUBCUTANEOUSLY IN THE MORNING   valsartan (DIOVAN) 320 MG tablet Take 1 tablet by mouth once daily   [DISCONTINUED] Saxagliptin-Metformin 07-998 MG TB24 Take 1 tablet by mouth daily.   No facility-administered encounter medications on file as of 01/21/2023.    Allergies (verified) Contrast media [iodinated contrast media], Adhesive [tape], Erythromycin, and Penicillins   History: Past Medical History:  Diagnosis Date   Anemia    Arthritis    Bronchitis    hx of   Diabetes mellitus    type 2   Family history of adverse reaction to anesthesia    per patient, "daughter threw up after a knee surgery"   GERD (gastroesophageal reflux disease)    Hypercholesteremia    Hypertension    Hypothyroidism    Pneumonia    hx of   PONV (postoperative nausea and vomiting)    Thyroid disease    Past Surgical History:  Procedure Laterality Date   ABDOMINAL HYSTERECTOMY     partial   BREAST SURGERY     lumpectomy left breast   carpel tunnel     COLONOSCOPY  07/2020   COLONOSCOPY W/ POLYPECTOMY     FOOT SURGERY     knee arhtroscopy     KNEE ARTHROPLASTY Left 05/2020   KYPHOPLASTY N/A 04/08/2022   Procedure: KYPHOPLASTY L1;  Surgeon: Venita Lick, MD;  Location: [facility] OR;  Service: Orthopedics;  Laterality: N/A;  [facility]   ORIF PATELLA Left 05/23/2020   Procedure: LEFT KNEE POLE PATELLA EXCISION;  Surgeon: Cammy Copa, MD;  Location[facility];  Service: Orthopedics;  Laterality: Left;   SHOULDER ARTHROSCOPY WITH SUBACROMIAL DECOMPRESSION, ROTATOR CUFF REPAIR AND BICEP TENDON REPAIR Right 05/26/2012    Procedure: Right Shoulder Diagnostic Operative Arthroscopy, Subacromial Decompression, Biceps Tenodesis, Mini Open Rotator Cuff Repair;  Surgeon: Cammy Copa, MD;  Location: [facility] OR;  Service: Orthopedics;  Laterality: Right;  Right Shoulder Diagnostic Operative Arthroscopy, Subacromial Decompression, Biceps Tenodesis, Mini Open Rotator Cuff Repair   TONSILLECTOMY     TOTAL KNEE ARTHROPLASTY Left 07/01/2019   Procedure: LEFT TOTAL KNEE ARTHROPLASTY;  Surgeon: Cammy Copa, MD;  Location: [facility] OR;  Service: Orthopedics;  Laterality: Left;   UPPER GI ENDOSCOPY  07/2020   Family History  Problem Relation Age of Onset   Diabetes Mother    Heart disease Mother    Hyperlipidemia Mother    Diabetes Father    Diabetes Sister    Hypertension Sister    Heart disease Sister    Dementia Sister    Heart disease Brother    Healthy Daughter    Healthy Daughter    Healthy Daughter    Healthy Son    Social History   Socioeconomic History   Marital status: Married    Spouse name: Not on file   Number of children: Not on file   Years of education: Not on file   Highest education level: Not on file  Occupational History   Not on file  Tobacco Use   Smoking status: Former    Current packs/day: 0.00    Types: Cigarettes    Quit date: 04/01/1990    Years since quitting: 32.8    Passive exposure: Never   Smokeless tobacco: Never  Vaping Use   Vaping status: Never Used  Substance and Sexual Activity   Alcohol use: Not Currently    Comment: 06/22/2019: per patient about 20-25 years ago   Drug use: No   Sexual activity: Never    Birth control/protection: Abstinence  Other Topics Concern   Not on file  Social History Narrative   Not on file   Social Determinants of Health   Financial Resource Strain: Low Risk  (01/21/2023)   Overall Financial Resource Strain (CARDIA)    Difficulty of Paying Living Expenses: Not hard at all  Food Insecurity: No Food Insecurity (01/21/2023)    Hunger Vital Sign    Worried About Running Out of Food in the Last Year: Never true    Ran Out of Food in the Last Year: Never true  Transportation Needs: No Transportation Needs (01/21/2023)   PRAPARE - Administrator, Civil Service (Medical): No    Lack of Transportation (Non-Medical): No  Physical Activity: Inactive (01/21/2023)   Exercise Vital Sign    Days of Exercise per Week: 0 days    Minutes of Exercise per Session: 0 min  Stress: No Stress Concern Present (01/21/2023)   [employer] of Occupational Health - Occupational Stress Questionnaire    Feeling of Stress : Not at all  Social Connections: Socially Integrated (01/21/2023)   Social Connection and Isolation Panel [NHANES]    Frequency of Communication with Friends and Family: More than three times a week    Frequency of Social Gatherings with Friends and Family: More than three times a week    Attends Religious Services: More than 4 times per year    Active Member of [employer] or Organizations: Yes    Attends Engineer, structural: More than 4 times per year    Marital Status: Married    Tobacco Counseling Counseling given: Not Answered   Clinical Intake:  Pre-visit preparation completed: Yes  Pain : 0-10 Pain Score: 8  Pain Type: Chronic pain Pain Location: Back     BMI - recorded: 31.09 Nutritional Status: BMI > 30  Obese Nutritional Risks: None Diabetes: No CBG done?: No Did pt. bring in CBG monitor from home?: No  How often do you need to have someone help you when you read instructions, pamphlets, or other written materials from your doctor or pharmacy?: 1 - Never What is the last grade level  you completed in school?: HSG  Interpreter Needed?: No  Information entered by :: Susie Cassette, LPN.   Activities of Daily Living    01/21/2023   10:20 AM 01/17/2023    7:37 PM  In your present state of health, do you have any difficulty performing the following activities:   Hearing? 0 0  Vision? 0 0  Difficulty concentrating or making decisions? 0 0  Walking or climbing stairs? 1 1  Dressing or bathing? 0 0  Doing errands, shopping? 0 0  Preparing Food and eating ? N N  Using the Toilet? N N  In the past six months, have you accidently leaked urine? N N  Do you have problems with loss of bowel control? N N  Managing your Medications? N N  Managing your Finances? N N  Housekeeping or managing your Housekeeping? N N    Patient Care Team: Myrlene Broker, MD as PCP - General (Internal Medicine) Reather Littler, MD (Inactive) as Consulting Physician (Endocrinology) Davina Poke as Consulting Physician (Optometry) Ginette Otto, [facility] as Consulting Physician (Obstetrics and Gynecology)  Indicate any recent Medical Services you may have received from other than [facility] providers in the past year (date may be approximate).     Assessment:   This is a routine wellness examination for [place].  Hearing/Vision screen Hearing Screening - Comments:: Denies hearing difficulties   Vision Screening - Comments:: Wears rx glasses - up to date with routine eye exams with London Sheer, OD.    Goals Addressed   None   Depression Screen    01/21/2023   10:19 AM 07/12/2022   11:25 AM 03/29/2022    9:31 AM 01/16/2022    9:39 AM 09/27/2021    9:34 AM 01/09/2021    9:55 AM 09/25/2020    9:15 AM  PHQ 2/9 Scores  PHQ - 2 Score 3 2 2  0 0 0 0  PHQ- 9 Score 5 10 2  2       Fall Risk    01/21/2023   10:19 AM 01/17/2023    7:37 PM 07/12/2022   11:24 AM 03/29/2022    9:31 AM 01/16/2022    9:35 AM  Fall Risk   Falls in the past year? 1 1 1 1 1   Number falls in past yr: 1 1 0 0 0  Injury with Fall? 1 1 1 1  0  Risk for fall due to : History of fall(s);Impaired balance/gait;Orthopedic patient  Other (Comment)    Follow up Education provided;Falls prevention discussed;Falls evaluation completed  Falls evaluation completed Falls evaluation completed  Falls evaluation completed    MEDICARE RISK AT HOME: Medicare Risk at Home Any stairs in or around the home?: Yes If so, are there any without handrails?: No Home free of loose throw rugs in walkways, pet beds, electrical cords, etc?: Yes Adequate lighting in your home to reduce risk of falls?: Yes Life alert?: No Use of a cane, walker or w/c?: Yes Grab bars in the bathroom?: Yes Shower chair or bench in shower?: No Elevated toilet seat or a handicapped toilet?: Yes  TIMED UP AND GO:  Was the test performed?  Yes  Length of time to ambulate 10 feet: 8 sec Gait steady and fast without use of assistive device    Cognitive Function:    01/21/2023   10:36 AM  MMSE - Mini Mental State Exam  Orientation to time 5  Orientation to Place 5  Registration 3  Attention/ Calculation  5  Recall 3  Language- name 2 objects 2  Language- repeat 1  Language- follow 3 step command 3  Language- read & follow direction 1  Write a sentence 1  Copy design 1  Total score 30        01/21/2023   10:20 AM 01/16/2022    9:36 AM  6CIT Screen  What Year? 0 points 0 points  What month? 0 points 0 points  What time? 0 points 0 points  Count back from 20 0 points 0 points  Months in reverse 0 points 0 points  Repeat phrase 0 points 0 points  Total Score 0 points 0 points    Immunizations Immunization History  Administered Date(s) Administered   Fluad Quad(high Dose 65+) 01/02/2019, 12/31/2019, 12/24/2022   Influenza Split 01/03/2011   Influenza Whole 04/01/2005, 12/18/2009   Influenza, High Dose Seasonal PF 01/07/2014, 01/27/2015, 01/02/2016, 01/13/2017, 01/16/2018   Influenza-Unspecified 11/30/2012, 01/03/2021, 01/08/2022   PFIZER Comirnaty(Gray Top)Covid-19 Tri-Sucrose Vaccine 12/24/2022   PFIZER(Purple Top)SARS-COV-2 Vaccination 05/28/2019, 06/23/2019, 01/15/2020, 07/03/2020, 01/03/2021   PNEUMOCOCCAL CONJUGATE-20 05/10/2021   Pfizer(Comirnaty)Fall Seasonal Vaccine 12 years and  older 01/08/2022   Pneumococcal Conjugate-13 09/12/2016, 01/02/2019   Pneumococcal Polysaccharide-23 07/15/2014, 12/27/2019   Respiratory Syncytial Virus Vaccine,Recomb Aduvanted(Arexvy) 01/08/2022   Td 04/01/2001, 10/07/2013   Zoster Recombinant(Shingrix) 01/09/2021, 03/12/2021    TDAP status: Up to date  Flu Vaccine status: Up to date  Pneumococcal vaccine status: Up to date  Covid-19 vaccine status: Completed vaccines  Qualifies for Shingles Vaccine? Yes   Zostavax completed No   Shingrix Completed?: Yes  Screening Tests Health Maintenance  Topic Date Due   OPHTHALMOLOGY EXAM  10/05/2022   COVID-19 Vaccine (8 - 2023-24 season) 02/18/2023   HEMOGLOBIN A1C  04/03/2023   Diabetic kidney evaluation - Urine ACR  07/05/2023   FOOT EXAM  08/21/2023   Colonoscopy  08/24/2023   DTaP/Tdap/Td (3 - Tdap) 10/08/2023   Diabetic kidney evaluation - eGFR measurement  10/28/2023   Medicare Annual Wellness (AWV)  01/21/2024   Pneumonia Vaccine 31+ Years old  Completed   INFLUENZA VACCINE  Completed   DEXA SCAN  Completed   Hepatitis C Screening  Completed   Zoster Vaccines- Shingrix  Completed   HPV VACCINES  Aged Out    Health Maintenance  Health Maintenance Due  Topic Date Due   OPHTHALMOLOGY EXAM  10/05/2022    Colorectal cancer screening: Type of screening: Colonoscopy. Completed 08/23/2020. Repeat every 5 years  Mammogram status: Completed 05/09/2022. Repeat every year  Bone Density status: Completed 04/23/2021. Results reflect: Bone density results: NORMAL. Repeat every 5 years.  Lung Cancer Screening: (Low Dose CT Chest recommended if Age 45-80 years, 20 pack-year currently smoking OR have quit w/in 15years.) does not qualify.   Lung Cancer Screening Referral: NO  Additional Screening:  Hepatitis C Screening: does qualify; Completed 12/24/2018  Vision Screening: Recommended annual ophthalmology exams for early detection of glaucoma and other disorders of the eye. Is  the patient up to date with their annual eye exam?  Yes  Who is the provider or what is the name of the office in which the patient attends annual eye exams? London Sheer, OD. If pt is not established with a provider, would they like to be referred to a provider to establish care? No .   Dental Screening: Recommended annual dental exams for proper oral hygiene  Diabetic Foot Exam: Diabetic Foot Exam: Completed 08/21/2022  Community Resource Referral / Chronic Care Management: CRR required  this visit?  No   CCM required this visit?  No     Plan:     I have personally reviewed and noted the following in the patient's chart:   Medical and social history Use of alcohol, tobacco or illicit drugs  Current medications and supplements including opioid prescriptions. Patient is not currently taking opioid prescriptions. Functional ability and status Nutritional status Physical activity Advanced directives List of other physicians Hospitalizations, surgeries, and ER visits in previous 12 months Vitals Screenings to include cognitive, depression, and falls Referrals and appointments  In addition, I have reviewed and discussed with patient certain preventive protocols, quality metrics, and best practice recommendations. A written personalized care plan for preventive services as well as general preventive health recommendations were provided to patient.     Mickeal Needy, LPN   [id number]   After Visit Summary: (In Person-Printed) AVS printed and given to the patient  Nurse Notes: Normal cognitive status assessed by direct observation by this Nurse Health Advisor. No abnormalities found.

## 2023-01-22 ENCOUNTER — Other Ambulatory Visit: Payer: Self-pay | Admitting: Physician Assistant

## 2023-01-22 NOTE — Telephone Encounter (Signed)
Last Fill: 10/10/2022  Labs: 10/28/2022 Glucose is 106. Rest of CMP WNL. Anemia is stable. Rest of CBC WNL.  Next Visit: 05/22/2023  Last Visit: 12/19/2022  DX: Rheumatoid arthritis involving multiple sites with positive rheumatoid factor   Current Dose per office note 12/19/2022: Arava 20 mg 1 tablet by mouth daily.   Okay to refill Arava ?

## 2023-01-23 DIAGNOSIS — H524 Presbyopia: Secondary | ICD-10-CM | POA: Diagnosis not present

## 2023-01-31 ENCOUNTER — Other Ambulatory Visit: Payer: Self-pay

## 2023-01-31 DIAGNOSIS — E063 Autoimmune thyroiditis: Secondary | ICD-10-CM

## 2023-01-31 MED ORDER — LEVOTHYROXINE SODIUM 112 MCG PO TABS: ORAL_TABLET | ORAL | 1 refills | Status: DC

## 2023-02-04 ENCOUNTER — Other Ambulatory Visit: Payer: Self-pay

## 2023-02-04 DIAGNOSIS — E118 Type 2 diabetes mellitus with unspecified complications: Secondary | ICD-10-CM

## 2023-02-04 DIAGNOSIS — E1165 Type 2 diabetes mellitus with hyperglycemia: Secondary | ICD-10-CM

## 2023-02-04 DIAGNOSIS — E063 Autoimmune thyroiditis: Secondary | ICD-10-CM

## 2023-02-06 ENCOUNTER — Other Ambulatory Visit (INDEPENDENT_AMBULATORY_CARE_PROVIDER_SITE_OTHER): Payer: Medicare Other

## 2023-02-06 DIAGNOSIS — E118 Type 2 diabetes mellitus with unspecified complications: Secondary | ICD-10-CM

## 2023-02-06 DIAGNOSIS — Z794 Long term (current) use of insulin: Secondary | ICD-10-CM

## 2023-02-06 DIAGNOSIS — E063 Autoimmune thyroiditis: Secondary | ICD-10-CM

## 2023-02-06 DIAGNOSIS — E1165 Type 2 diabetes mellitus with hyperglycemia: Secondary | ICD-10-CM | POA: Diagnosis not present

## 2023-02-06 LAB — HEMOGLOBIN A1C: Hgb A1c MFr Bld: 5.7 % (ref 4.6–6.5)

## 2023-02-06 LAB — TSH: TSH: 2.72 u[IU]/mL (ref 0.35–5.50)

## 2023-02-06 LAB — BASIC METABOLIC PANEL
BUN: 9 mg/dL (ref 6–23)
CO2: 28 meq/L (ref 19–32)
Calcium: 10 mg/dL (ref 8.4–10.5)
Chloride: 103 meq/L (ref 96–112)
Creatinine, Ser: 0.67 mg/dL (ref 0.40–1.20)
GFR: 85.48 mL/min (ref >60.00)
Glucose, Bld: 69 mg/dL — ABNORMAL LOW (ref 70–99)
Potassium: 4.3 meq/L (ref 3.5–5.1)
Sodium: 139 meq/L (ref 135–145)

## 2023-02-11 ENCOUNTER — Encounter: Payer: Self-pay | Admitting: Endocrinology

## 2023-02-11 ENCOUNTER — Ambulatory Visit (INDEPENDENT_AMBULATORY_CARE_PROVIDER_SITE_OTHER): Payer: Medicare Other | Admitting: Endocrinology

## 2023-02-11 VITALS — BP 140/70 | HR 91 | Resp 20 | Ht 65.0 in | Wt 188.6 lb

## 2023-02-11 DIAGNOSIS — E118 Type 2 diabetes mellitus with unspecified complications: Secondary | ICD-10-CM | POA: Diagnosis not present

## 2023-02-11 DIAGNOSIS — Z7984 Long term (current) use of oral hypoglycemic drugs: Secondary | ICD-10-CM

## 2023-02-11 DIAGNOSIS — Z794 Long term (current) use of insulin: Secondary | ICD-10-CM | POA: Diagnosis not present

## 2023-02-11 DIAGNOSIS — E063 Autoimmune thyroiditis: Secondary | ICD-10-CM | POA: Diagnosis not present

## 2023-02-11 MED ORDER — GVOKE HYPOPEN 1-PACK 1 MG/0.2ML ~~LOC~~ SOAJ: 1.0000 mg | SUBCUTANEOUS | 2 refills | Status: AC | PRN

## 2023-02-11 NOTE — Patient Instructions (Addendum)
Diabetes regimen:  Decrease tresiba 26 units in the morning. Metformin 1000 mg two times a day. Farxiga 5mg  daily. Novolog sliding scale before meals.  Mild Sliding Scale Blood Glucose        Insulin 60-150                     None 151-200                   1 unit 201-250                   2 units 251-300                   4 units 301-350                   6 units 351-400                   8 units      >400                        9 units and call provider   Take glucose tablet, over the counter for low sugar 2-4 tablets at a time.

## 2023-02-11 NOTE — Progress Notes (Signed)
Outpatient Endocrinology Note [place] Meron Bocchino, MD  02/11/23  Patient's Name: Marissa Roberts    DOB: 07-Sep-1945    MRN: [id number]                                                    REASON OF VISIT: Follow up of type 2 diabetes mellitus / Hypothyroidism  PCP: Myrlene Broker, MD  HISTORY OF PRESENT ILLNESS:   Marissa Roberts is a 77 y.o. old female with past medical history listed below, is here for follow up for type 2 diabetes mellitus.   Pertinent Diabetes History: Patient was diagnosed with type 2 diabetes mellitus in 2005.  Patient has controlled type 2 diabetes mellitus.  Chronic Diabetes Complications : Retinopathy: no. Last ophthalmology exam was done on annuallyy, following with ophthalmology regularly.  Nephropathy: yes, microalbuminuria present., on ACE/ARB /valsartan. Peripheral neuropathy: no Coronary artery disease: no Stroke: on  Relevant comorbidities and cardiovascular risk factors: Obesity: yes Body mass index is 31.38 kg/m.  Hypertension: Yes  Hyperlipidemia : Yes, on statin   Current / Home Diabetic regimen includes: Tresiba 32 units in the morning.  Novolog occasionally 1 - 5 units per sliding scale.  Metformin 1000 mg 2 times a day. Farxiga 5 mg daily.  Prior diabetic medications: Kombiglyze, Byetta.  V-Go pump in the past, A stopped due to cost, skin irritation and discomfort.  She had taken Lantus in the past.  Glycemic data:    CONTINUOUS GLUCOSE MONITORING SYSTEM (CGMS) INTERPRETATION: At today's visit, we reviewed CGM downloads. The full report is scanned in the media. Reviewing the CGM trends, blood glucose are as follows:  Dexcom G7 CGM-  Sensor Download (Sensor download was reviewed and summarized below.) Dates: October 30 to February 11, 2023, 14 days  Glucose Management Indicator: 5.7% Sensor Average: 98 SD 28 Sensor usage :  %  Glycemic Trends:  <54: 1% 54-70: 11% 71-180: 86% 181-250: 2% 251-400:  0%  Interpretation: -Patient has frequent hypoglycemia overnight with a blood sugar in 40-60 range and also in between the meals especially on November 3, 4, 5, 6.  When she is not having hypoglycemia she had low normal blood sugar in the range of 70s overnight.  No concerning hyperglycemia related with meals.  Blood sugar has almost always been in the normal range below 200 related with meals.  Hypoglycemia: Patient has frequent hypoglycemic episodes. Patient has hypoglycemia awareness.  Factors modifying glucose control: 1.  Diabetic diet assessment: 3 meals a day.  2.  Staying active or exercising: No formal exercise.  3.  Medication compliance: compliant all of the time.  # Primary hypothyroidism -She has been hypothyroid for 40+ years.  She has been taking levothyroxine since her diagnosis.  She is currently taking levothyroxine 112 mcg daily.  Interval history  CGM data as reviewed above.  She is frequent hypoglycemia.  Diabetes regimen as reviewed above.  She has been taking levothyroxine 112 mcg daily.  Recent TSH normal.  No other complaints today.   Latest Reference Range & Units 02/06/23 08:26  TSH 0.35 - 5.50 uIU/mL 2.72    REVIEW OF SYSTEMS As per history of present illness.   PAST MEDICAL HISTORY: Past Medical History:  Diagnosis Date   Anemia    Arthritis    Bronchitis    hx of  Diabetes mellitus    type 2   Family history of adverse reaction to anesthesia    per patient, "daughter threw up after a knee surgery"   GERD (gastroesophageal reflux disease)    Hypercholesteremia    Hypertension    Hypothyroidism    Pneumonia    hx of   PONV (postoperative nausea and vomiting)    Thyroid disease     PAST SURGICAL HISTORY: Past Surgical History:  Procedure Laterality Date   ABDOMINAL HYSTERECTOMY     partial   BREAST SURGERY     lumpectomy left breast   carpel tunnel     COLONOSCOPY  07/2020   COLONOSCOPY W/ POLYPECTOMY     FOOT SURGERY     knee  arhtroscopy     KNEE ARTHROPLASTY Left 05/2020   KYPHOPLASTY N/A 04/08/2022   Procedure: KYPHOPLASTY L1;  Surgeon: Venita Lick, MD;  Location: [facility] OR;  Service: Orthopedics;  Laterality: N/A;  [facility] with [facility]   ORIF PATELLA Left 05/23/2020   Procedure: LEFT KNEE POLE PATELLA EXCISION;  Surgeon: Cammy Copa, MD;  Location: [facility] OR;  Service: Orthopedics;  Laterality: Left;   SHOULDER ARTHROSCOPY WITH SUBACROMIAL DECOMPRESSION, ROTATOR CUFF REPAIR AND BICEP TENDON REPAIR Right 05/26/2012   Procedure: Right Shoulder Diagnostic Operative Arthroscopy, Subacromial Decompression, Biceps Tenodesis, Mini Open Rotator Cuff Repair;  Surgeon: Cammy Copa, MD;  Location: [facility] OR;  Service: Orthopedics;  Laterality: Right;  Right Shoulder Diagnostic Operative Arthroscopy, Subacromial Decompression, Biceps Tenodesis, Mini Open Rotator Cuff Repair   TONSILLECTOMY     TOTAL KNEE ARTHROPLASTY Left 07/01/2019   Procedure: LEFT TOTAL KNEE ARTHROPLASTY;  Surgeon: Cammy Copa, MD;  Location: [facility] OR;  Service: Orthopedics;  Laterality: Left;   UPPER GI ENDOSCOPY  07/2020    ALLERGIES: Allergies  Allergen Reactions   Contrast Media [Iodinated Contrast Media]     Throat swells from ct contra   Adhesive [Tape]     No paper tape, causes blisters   Erythromycin Rash   Penicillins Rash    Did it involve swelling of the face/tongue/throat, SOB, or low BP? No Did it involve sudden or severe rash/hives, skin peeling, or any reaction on the inside of your mouth or nose? No Did you need to seek medical attention at a hospital or doctor's office? No When did it last happen?    Many years If all above answers are "NO", may proceed with cephalosporin use.     FAMILY HISTORY:  Family History  Problem Relation Age of Onset   Diabetes Mother    Heart disease Mother    Hyperlipidemia Mother    Diabetes Father    Diabetes Sister    Hypertension Sister    Heart disease Sister    Dementia  Sister    Heart disease Brother    Healthy Daughter    Healthy Daughter    Healthy Daughter    Healthy Son     SOCIAL HISTORY: Social History   Socioeconomic History   Marital status: Married    Spouse name: Not on file   Number of children: Not on file   Years of education: Not on file   Highest education level: Not on file  Occupational History   Not on file  Tobacco Use   Smoking status: Former    Current packs/day: 0.00    Types: Cigarettes    Quit date: 04/01/1990    Years since quitting: 32.8    Passive exposure: Never  Smokeless tobacco: Never  Vaping Use   Vaping status: Never Used  Substance and Sexual Activity   Alcohol use: Not Currently    Comment: 06/22/2019: per patient about 20-25 years ago   Drug use: No   Sexual activity: Never    Birth control/protection: Abstinence  Other Topics Concern   Not on file  Social History Narrative   Not on file   Social Determinants of Health   Financial Resource Strain: Low Risk  (01/21/2023)   Overall Financial Resource Strain (CARDIA)    Difficulty of Paying Living Expenses: Not hard at all  Food Insecurity: No Food Insecurity (01/21/2023)   Hunger Vital Sign    Worried About Running Out of Food in the Last Year: Never true    Ran Out of Food in the Last Year: Never true  Transportation Needs: No Transportation Needs (01/21/2023)   PRAPARE - Administrator, Civil Service (Medical): No    Lack of Transportation (Non-Medical): No  Physical Activity: Inactive (01/21/2023)   Exercise Vital Sign    Days of Exercise per Week: 0 days    Minutes of Exercise per Session: 0 min  Stress: No Stress Concern Present (01/21/2023)   [employer] of Occupational Health - Occupational Stress Questionnaire    Feeling of Stress : Not at all  Social Connections: Socially Integrated (01/21/2023)   Social Connection and Isolation Panel [NHANES]    Frequency of Communication with Friends and Family: More than three  times a week    Frequency of Social Gatherings with Friends and Family: More than three times a week    Attends Religious Services: More than 4 times per year    Active Member of [employer] or Organizations: Yes    Attends Engineer, structural: More than 4 times per year    Marital Status: Married    MEDICATIONS:  Current Outpatient Medications  Medication Sig Dispense Refill   acetaminophen (TYLENOL) 325 MG tablet Take 2 tablets (650 mg total) by mouth every 6 (six) hours as needed for moderate pain. 30 tablet 0   amLODipine (NORVASC) 10 MG tablet Take 1 tablet by mouth once daily 90 tablet 3   Biotin 24401 MCG TABS Take 10,000 mcg by mouth daily.     Calcium Carb-Cholecalciferol (CALCIUM 600 + D PO) Take 1 tablet by mouth daily.     Continuous Blood Gluc Receiver (DEXCOM G6 RECEIVER) DEVI For sensor monitoring 1 each 0   Continuous Blood Gluc Sensor (DEXCOM G6 SENSOR) MISC Use to monitor blood sugar, change after 10 days 9 each 3   Continuous Blood Gluc Transmit (DEXCOM G6 TRANSMITTER) MISC 4 PER YEAR 1 each 3   diclofenac Sodium (VOLTAREN) 1 % GEL Apply topically as needed.     FARXIGA 5 MG TABS tablet Take 1 tablet by mouth once daily 90 tablet 0   fluticasone (FLONASE) 50 MCG/ACT nasal spray Place 2 sprays into both nostrils 2 (two) times daily as needed for allergies or rhinitis.     furosemide (LASIX) 40 MG tablet Take 2 tablets by mouth once daily 180 tablet 0   Glucagon (GVOKE HYPOPEN 1-PACK) 1 MG/0.2ML SOAJ Inject 1 mg into the skin as needed (low blood sugar with impaired consciousness). 0.4 mL 2   Glucosamine HCl-MSM (GLUCOSAMINE-MSM PO) Take 1 tablet by mouth daily.     glucose blood (ONETOUCH VERIO) test strip USE 1 STRIP TO CHECK GLUCOSE THREE TIMES DAILY 2 HOURS AFTER A MEAL AND 1 THREE TIMES  A WEEK UPON WAKING UP 250 each 2   Insulin Pen Needle (BD PEN NEEDLE NANO U/F) 32G X 4 MM MISC USE 1  4 TIMES DAILY TO  INJECT  INSULIN 200 each 2   ketoconazole (NIZORAL) 2 % cream  Apply to both feet and between toes once daily for 6 weeks. 60 g 1   latanoprost (XALATAN) 0.005 % ophthalmic solution SMARTSIG:1 Drop(s) In Eye(s) Every Evening     leflunomide (ARAVA) 20 MG tablet Take 1 tablet by mouth once daily 90 tablet 0   levothyroxine (SYNTHROID) 112 MCG tablet TAKE 1 TABLET BY MOUTH BEFORE BREAKFAST 90 tablet 1   LINZESS 290 MCG CAPS capsule TAKE 1 CAPSULE BY MOUTH ONCE DAILY AS NEEDED 90 capsule 0   Magnesium 100 MG CAPS Take 100 mg by mouth daily.     metFORMIN (GLUCOPHAGE) 1000 MG tablet TAKE 1 TABLET BY MOUTH TWICE DAILY WITH A MEAL 180 tablet 3   montelukast (SINGULAIR) 10 MG tablet TAKE 1 TABLET BY MOUTH AT BEDTIME 90 tablet 0   NOVOLOG FLEXPEN 100 UNIT/ML FlexPen INJECT 10 TO 15 UNITS SUBCUTANEOUSLY THREE TIMES DAILY BEFORE MEAL(S) 15 mL 0   omeprazole (PRILOSEC) 40 MG capsule Take 40 mg by mouth daily.     ondansetron (ZOFRAN) 4 MG tablet Take 1 tablet (4 mg total) by mouth every 8 (eight) hours as needed for nausea or vomiting. 20 tablet 0   OneTouch Delica Lancets 33G MISC USE TO CHECK BLOOD SUGAR UP TO THREE TIMES A DAY, 2 HOURS AFTER A MEAL AND UP TO 3 TIMES A WEEK UPON WAKING UP 300 each 0   potassium chloride (KLOR-CON) 10 MEQ tablet Take 1 tablet by mouth once daily 90 tablet 0   rosuvastatin (CRESTOR) 40 MG tablet Take 1 tablet by mouth once daily 90 tablet 1   traMADol (ULTRAM) 50 MG tablet Take 50 mg by mouth every 6 (six) hours as needed.     TRESIBA FLEXTOUCH 200 UNIT/ML FlexTouch Pen INJECT 32 UNITS SUBCUTANEOUSLY IN THE MORNING 9 mL 0   valsartan (DIOVAN) 320 MG tablet Take 1 tablet by mouth once daily 90 tablet 0   No current facility-administered medications for this visit.    PHYSICAL EXAM: Vitals:   02/11/23 0935 02/11/23 0937  BP: (!) 150/60 (!) 140/70  Pulse: 91   Resp: 20   SpO2: 98%   Weight: 188 lb 9.6 oz (85.5 kg)   Height: 5\' 5"  (1.651 m)    Body mass index is 31.38 kg/m.  Wt Readings from Last 3 Encounters:  02/11/23 188 lb  9.6 oz (85.5 kg)  01/21/23 186 lb 12.8 oz (84.7 kg)  12/19/22 188 lb 6.4 oz (85.5 kg)    General: Well developed, well nourished female in no apparent distress.  HEENT: AT/[place], no external lesions.  Eyes: Conjunctiva clear and no icterus. Neck: Neck supple  Lungs: Respirations not labored Neurologic: Alert, oriented, normal speech Extremities / Skin: Dry. No sores or rashes noted.  Psychiatric: Does not appear depressed or anxious  Diabetic Foot Exam - Simple   No data filed     LABS Reviewed Lab Results  Component Value Date   HGBA1C 5.7 02/06/2023   HGBA1C 5.7 10/01/2022   HGBA1C 5.5 07/05/2022   Lab Results  Component Value Date   FRUCTOSAMINE 194 07/30/2021   Lab Results  Component Value Date   CHOL 150 07/05/2022   HDL 50.70 07/05/2022   LDLCALC 72 07/05/2022   LDLDIRECT 62.0 05/18/2021  TRIG 138.0 07/05/2022   CHOLHDL 3 07/05/2022   Lab Results  Component Value Date   MICRALBCREAT 36.1 (H) 07/05/2022   MICRALBCREAT 17.3 05/30/2021   Lab Results  Component Value Date   CREATININE 0.67 02/06/2023   Lab Results  Component Value Date   GFR 85.48 02/06/2023    ASSESSMENT / PLAN  1. Diabetes mellitus type 2 with complications (HCC)   2. Acquired autoimmune hypothyroidism     Diabetes Mellitus type 2, complicated by microalbuminuria. - Diabetic status / severity: Controlled with hypoglycemia.  Lab Results  Component Value Date   HGBA1C 5.7 02/06/2023    - Hemoglobin A1c goal : <7%  She having frequent hypoglycemia decrease the insulin regimen basal and bolus as follows.  - Medications: See below.  Diabetes regimen:  Decrease tresiba 26 units in the morning. Metformin 1000 mg two times a day. Farxiga 5mg  daily.  Novolog sliding scale before meals.  Mild Sliding Scale Blood Glucose        Insulin 60-150                     None 151-200                   1 unit 201-250                   2 units 251-300                   4  units 301-350                   6 units 351-400                   8 units      >400                        9 units and call provider  - Home glucose testing: CGM Dexcom G7 and check as needed. - Discussed/ Gave Hypoglycemia treatment plan.  Advised to use glucose tablet for correcting hypoglycemia.  She has Glucagon Emergency Kit at home.  # Consult : not required at this time.   # Annual urine for microalbuminuria/ creatinine ratio, no microalbuminuria currently, continue ACE/ARB /valsartan. Last  Lab Results  Component Value Date   MICRALBCREAT 36.1 (H) 07/05/2022    # Foot check nightly.  # Annual dilated diabetic eye exams.   - Diet: Make healthy diabetic food choices - Life style / activity / exercise: Discussed.  2. Blood pressure  -  BP Readings from Last 1 Encounters:  02/11/23 (!) 140/70    - Control is in target.  - No change in current plans.  Patient is advised to monitor blood pressure at home, if remained high asked to talk with primary care provider.  3. Lipid status / Hyperlipidemia - Last  Lab Results  Component Value Date   LDLCALC 72 07/05/2022   - Continue rosuvastatin 40 mg daily.  # Primary hypothyroidism -Continue levothyroxine 112 mcg daily.  Diagnoses and all orders for this visit:  Diabetes mellitus type 2 with complications (HCC) -     Basic metabolic panel; Future -     Hemoglobin A1c; Future  Acquired autoimmune hypothyroidism  Other orders -     Glucagon (GVOKE HYPOPEN 1-PACK) 1 MG/0.2ML SOAJ; Inject 1 mg into the skin as needed (low blood sugar with impaired consciousness).    DISPOSITION Follow  up in clinic in 3 months suggested.   All questions answered and patient verbalized understanding of the plan.  Ira[place] Tabari Volkert, MD [facility] [address] [place], [address] [place], [place] [address] Phone # [phone]  At least part of this note was generated using voice recognition software.  Inadvertent word errors may have occurred, which were not recognized during the proofreading process.

## 2023-02-12 ENCOUNTER — Other Ambulatory Visit: Payer: Self-pay

## 2023-02-12 ENCOUNTER — Encounter: Payer: Self-pay | Admitting: Endocrinology

## 2023-02-12 DIAGNOSIS — E118 Type 2 diabetes mellitus with unspecified complications: Secondary | ICD-10-CM

## 2023-02-12 MED ORDER — FARXIGA 5 MG PO TABS: 5.0000 mg | ORAL_TABLET | Freq: Every day | ORAL | 0 refills | Status: DC

## 2023-02-13 ENCOUNTER — Other Ambulatory Visit: Payer: Self-pay

## 2023-02-13 ENCOUNTER — Other Ambulatory Visit: Payer: Self-pay | Admitting: Internal Medicine

## 2023-02-19 ENCOUNTER — Other Ambulatory Visit: Payer: Self-pay

## 2023-02-19 DIAGNOSIS — I10 Essential (primary) hypertension: Secondary | ICD-10-CM

## 2023-02-19 MED ORDER — VALSARTAN 320 MG PO TABS: 320.0000 mg | ORAL_TABLET | Freq: Every day | ORAL | 0 refills | Status: DC

## 2023-02-19 NOTE — Telephone Encounter (Signed)
Patient called requesting refill, however unable to understand what was needed, left VM to call RN back

## 2023-03-03 DIAGNOSIS — E1165 Type 2 diabetes mellitus with hyperglycemia: Secondary | ICD-10-CM | POA: Diagnosis not present

## 2023-03-04 ENCOUNTER — Other Ambulatory Visit: Payer: Self-pay | Admitting: Endocrinology

## 2023-03-04 DIAGNOSIS — E118 Type 2 diabetes mellitus with unspecified complications: Secondary | ICD-10-CM

## 2023-03-04 NOTE — Telephone Encounter (Signed)
Evaristo Bury refill request complete

## 2023-03-11 ENCOUNTER — Encounter: Payer: Self-pay | Admitting: Podiatry

## 2023-03-11 ENCOUNTER — Ambulatory Visit: Payer: Medicare Other | Admitting: Podiatry

## 2023-03-11 ENCOUNTER — Ambulatory Visit (INDEPENDENT_AMBULATORY_CARE_PROVIDER_SITE_OTHER): Payer: Medicare Other | Admitting: Podiatry

## 2023-03-11 DIAGNOSIS — B353 Tinea pedis: Secondary | ICD-10-CM

## 2023-03-11 DIAGNOSIS — Q828 Other specified congenital malformations of skin: Secondary | ICD-10-CM

## 2023-03-11 DIAGNOSIS — E119 Type 2 diabetes mellitus without complications: Secondary | ICD-10-CM | POA: Diagnosis not present

## 2023-03-11 DIAGNOSIS — B351 Tinea unguium: Secondary | ICD-10-CM

## 2023-03-11 DIAGNOSIS — M79675 Pain in left toe(s): Secondary | ICD-10-CM

## 2023-03-11 DIAGNOSIS — Z794 Long term (current) use of insulin: Secondary | ICD-10-CM

## 2023-03-11 DIAGNOSIS — M79674 Pain in right toe(s): Secondary | ICD-10-CM

## 2023-03-12 ENCOUNTER — Encounter: Payer: Self-pay | Admitting: Podiatry

## 2023-03-12 MED ORDER — NYSTATIN-TRIAMCINOLONE 100000-0.1 UNIT/GM-% EX OINT: TOPICAL_OINTMENT | CUTANEOUS | 1 refills | Status: AC

## 2023-03-12 NOTE — Progress Notes (Signed)
Subjective:  Patient ID: Marissa Roberts, female    DOB: Aug 31, 1945,  MRN: [id number]  77 y.o. female presents to clinic with  at risk foot care with history of diabetic neuropathy and painful porokeratotic lesion(s) right foot and painful mycotic toenails that limit ambulation. Painful toenails interfere with ambulation. Aggravating factors include wearing enclosed shoe gear. Pain is relieved with periodic professional debridement. Painful porokeratotic lesions are aggravated when weightbearing with and without shoegear. Pain is relieved with periodic professional debridement.  Chief Complaint  Patient presents with   Diabetes    Patient states she last saw her pcp about 6 months ago, patient A1c is 6.0 . Patient states her left foot is irritated on the left side of her foot.   She states her left foot is itching.  PCP is Myrlene Broker, MD.  Allergies  Allergen Reactions   Contrast Media [Iodinated Contrast Media]     Throat swells from ct contra   Adhesive [Tape]     No paper tape, causes blisters   Erythromycin Rash   Penicillins Rash    Did it involve swelling of the face/tongue/throat, SOB, or low BP? No Did it involve sudden or severe rash/hives, skin peeling, or any reaction on the inside of your mouth or nose? No Did you need to seek medical attention at a hospital or doctor's office? No When did it last happen?    Many years If all above answers are "NO", may proceed with cephalosporin use.     Review of Systems: Negative except as noted in the HPI.   Objective:  Marissa Roberts is a pleasant 77 y.o. female WD, WN in NAD. AAO x 3.  Vascular Examination: Vascular status intact b/l with palpable pedal pulses. CFT immediate b/l. No edema. No pain with calf compression b/l. Skin temperature gradient WNL b/l. No cyanosis or clubbing noted b/l LE.  Neurological Examination: Pt has subjective symptoms of neuropathy. Sensation grossly intact b/l with 10 gram  monofilament.  Dermatological Examination: Pedal skin with normal turgor, texture and tone b/l. Toenails 1-5 b/l thick, discolored, elongated with subungual debris and pain on dorsal palpation.  Porokeratotic lesion(s) R 4th toe. No erythema, no edema, no drainage, no fluctuance.  Musculoskeletal Examination: Muscle strength 5/5 to b/l LE. No pain, crepitus or joint limitation noted with ROM bilateral LE. Hammertoe(s) noted to the of both feet.  Radiographs: None  Last A1c:      Latest Ref Rng & Units 02/06/2023    8:26 AM 10/01/2022    8:22 AM 07/05/2022    9:02 AM  Hemoglobin A1C  Hemoglobin-A1c 4.6 - 6.5 % 5.7  5.7  5.5      Assessment:   1. Pain due to onychomycosis of toenails of both feet   2. Tinea pedis of left foot   3. Porokeratosis   4. Type 2 diabetes mellitus without complication, with long-term current use of insulin (HCC)    Plan:  -Consent given for treatment as described below: -Examined patient. -Continue supportive shoe gear daily. -Toenails 1-5 b/l were debrided in length and girth with sterile nail nippers and dremel without iatrogenic bleeding.  -Porokeratotic lesion(s) R 4th toe pared and enucleated with sterile currette without incident. Total number of lesions debrided=1. -For tinea pedis, Rx sent to pharmacy for Mycolog Ointment. Apply to affected area twice daily avoiding application between toes. -Patient/POA to call should there be question/concern in the interim.  Return in about 9 weeks (around 05/13/2023).  Lise Auer  Eloy End, DPM      [place] LOCATION: [address], [place] [address]                   Office [phone]   [facility] LOCATION: [address] [place], [place] [address] Office [phone]

## 2023-03-17 ENCOUNTER — Other Ambulatory Visit: Payer: Self-pay | Admitting: Internal Medicine

## 2023-03-19 ENCOUNTER — Other Ambulatory Visit: Payer: Self-pay

## 2023-03-19 DIAGNOSIS — E118 Type 2 diabetes mellitus with unspecified complications: Secondary | ICD-10-CM

## 2023-03-19 DIAGNOSIS — I10 Essential (primary) hypertension: Secondary | ICD-10-CM

## 2023-03-19 MED ORDER — POTASSIUM CHLORIDE ER 10 MEQ PO TBCR: 10.0000 meq | EXTENDED_RELEASE_TABLET | Freq: Every day | ORAL | 0 refills | Status: DC

## 2023-03-30 ENCOUNTER — Other Ambulatory Visit: Payer: Self-pay | Admitting: Internal Medicine

## 2023-04-22 DIAGNOSIS — M4856XA Collapsed vertebra, not elsewhere classified, lumbar region, initial encounter for fracture: Secondary | ICD-10-CM | POA: Diagnosis not present

## 2023-04-22 DIAGNOSIS — M5416 Radiculopathy, lumbar region: Secondary | ICD-10-CM | POA: Diagnosis not present

## 2023-04-22 DIAGNOSIS — M48061 Spinal stenosis, lumbar region without neurogenic claudication: Secondary | ICD-10-CM | POA: Diagnosis not present

## 2023-04-22 DIAGNOSIS — M5451 Vertebrogenic low back pain: Secondary | ICD-10-CM | POA: Diagnosis not present

## 2023-04-26 DIAGNOSIS — M5416 Radiculopathy, lumbar region: Secondary | ICD-10-CM | POA: Diagnosis not present

## 2023-04-30 DIAGNOSIS — Z1231 Encounter for screening mammogram for malignant neoplasm of breast: Secondary | ICD-10-CM | POA: Diagnosis not present

## 2023-04-30 DIAGNOSIS — Z01419 Encounter for gynecological examination (general) (routine) without abnormal findings: Secondary | ICD-10-CM | POA: Diagnosis not present

## 2023-05-01 ENCOUNTER — Other Ambulatory Visit: Payer: Self-pay | Admitting: Endocrinology

## 2023-05-01 ENCOUNTER — Other Ambulatory Visit: Payer: Self-pay

## 2023-05-01 ENCOUNTER — Other Ambulatory Visit: Payer: Self-pay | Admitting: Rheumatology

## 2023-05-01 DIAGNOSIS — Z79899 Other long term (current) drug therapy: Secondary | ICD-10-CM

## 2023-05-01 DIAGNOSIS — E118 Type 2 diabetes mellitus with unspecified complications: Secondary | ICD-10-CM

## 2023-05-01 NOTE — Progress Notes (Signed)
Hemoglobin remains slightly low but stable. Rest of CBC WNL

## 2023-05-01 NOTE — Telephone Encounter (Signed)
Last Fill: 01/22/2023  Labs: 02/06/2023 BMP Glucose 69, 10/28/2022 Glucose is 106. Rest of CMP WNL. Anemia is stable. Rest of CBC WNL. I called patient, patient will have labs drawn today or tomorrow.  Next Visit: 05/20/2023  Last Visit: 12/19/2022  DX: Rheumatoid arthritis involving multiple sites with positive rheumatoid factor   Current Dose per office note 12/19/2022: Arava 20 mg 1 tablet by mouth daily.    Okay to refill Arava ?

## 2023-05-02 LAB — CBC WITH DIFFERENTIAL/PLATELET
Absolute Lymphocytes: 3020 {cells}/uL (ref 850–3900)
Absolute Monocytes: 778 {cells}/uL (ref 200–950)
Basophils Absolute: 51 {cells}/uL (ref 0–200)
Basophils Relative: 0.5 %
Eosinophils Absolute: 212 {cells}/uL (ref 15–500)
Eosinophils Relative: 2.1 %
HCT: 35.3 % (ref 35.0–45.0)
Hemoglobin: 11.3 g/dL — ABNORMAL LOW (ref 11.7–15.5)
MCH: 27.8 pg (ref 27.0–33.0)
MCHC: 32 g/dL (ref 32.0–36.0)
MCV: 86.7 fL (ref 80.0–100.0)
MPV: 10.2 fL (ref 7.5–12.5)
Monocytes Relative: 7.7 %
Neutro Abs: 6040 {cells}/uL (ref 1500–7800)
Neutrophils Relative %: 59.8 %
Platelets: 388 10*3/uL (ref 140–400)
RBC: 4.07 10*6/uL (ref 3.80–5.10)
RDW: 13.7 % (ref 11.0–15.0)
Total Lymphocyte: 29.9 %
WBC: 10.1 10*3/uL (ref 3.8–10.8)

## 2023-05-02 LAB — COMPLETE METABOLIC PANEL WITH GFR
AG Ratio: 1.4 (calc) (ref 1.0–2.5)
ALT: 16 U/L (ref 6–29)
AST: 13 U/L (ref 10–35)
Albumin: 3.7 g/dL (ref 3.6–5.1)
Alkaline phosphatase (APISO): 104 U/L (ref 37–153)
BUN: 12 mg/dL (ref 7–25)
CO2: 28 mmol/L (ref 20–32)
Calcium: 9.6 mg/dL (ref 8.6–10.4)
Chloride: 102 mmol/L (ref 98–110)
Creat: 0.79 mg/dL (ref 0.60–1.00)
Globulin: 2.6 g/dL (ref 1.9–3.7)
Glucose, Bld: 107 mg/dL — ABNORMAL HIGH (ref 65–99)
Potassium: 4.5 mmol/L (ref 3.5–5.3)
Sodium: 141 mmol/L (ref 135–146)
Total Bilirubin: 0.2 mg/dL (ref 0.2–1.2)
Total Protein: 6.3 g/dL (ref 6.1–8.1)
eGFR: 78 mL/min/{1.73_m2} (ref >=60)

## 2023-05-02 NOTE — Progress Notes (Signed)
Glucose is 107.  Rest of CMP WNL

## 2023-05-06 NOTE — Progress Notes (Unsigned)
Office Visit Note  Patient: Marissa Roberts             Date of Birth: December 22, 1945           MRN: [id number]             PCP: Myrlene Broker, MD Referring: Myrlene Broker, * Visit Date: 05/20/2023 Occupation: @GUAROCC @  Subjective:  Chronic lower back pain   History of Present Illness: Marissa Roberts is a 78 y.o. female with history of seropositive rheumatoid arthritis and osteoarthritis. Patient remains on arava 20 mg 1 tablet by mouth daily.  She continues to tolerate arava without any side effects and has not had any recent gaps in therapy.  She denies any signs or symptoms of a rheumatoid arthritis flare.  She is occasional soreness in her hands particularly the right CMC joint but denies any joint swelling.  She continues to have chronic pain in her lower back as well as some radiating pain in her left leg. She denies any recent or recurrent infections.  She denies any recent gaps in therapy.  Activities of Daily Living:  Patient reports morning stiffness for 0 minutes.   Patient Reports intermittent nocturnal pain.  Difficulty dressing/grooming: Denies Difficulty climbing stairs: Reports Difficulty getting out of chair: Reports Difficulty using hands for taps, buttons, cutlery, and/or writing: Denies  Review of Systems  Constitutional:  Positive for fatigue.  HENT:  Negative for mouth sores, mouth dryness and nose dryness.   Eyes:  Negative for dryness.  Respiratory:  Negative for shortness of breath.   Cardiovascular:  Negative for chest pain and palpitations.  Gastrointestinal:  Positive for constipation. Negative for blood in stool and diarrhea.  Endocrine: Negative for increased urination.  Genitourinary:  Negative for involuntary urination.  Musculoskeletal:  Positive for joint pain, joint pain, myalgias and myalgias. Negative for gait problem, joint swelling, muscle weakness, morning stiffness and muscle tenderness.  Skin:  Positive for rash. Negative  for color change, hair loss and sensitivity to sunlight.  Allergic/Immunologic: Negative for susceptible to infections.  Neurological:  Negative for dizziness and headaches.  Hematological:  Negative for swollen glands.  Psychiatric/Behavioral:  Negative for depressed mood and sleep disturbance. The patient is not nervous/anxious.     PMFS History:  Patient Active Problem List   Diagnosis Date Noted   Lumbar pain 07/24/2022   Lumbar compression fracture (HCC) 03/29/2022   Anxiety 02/25/2022   Low back pain 02/25/2022   Generalized subcutaneous nodules 11/22/2021   Displaced transverse fracture of left patella, subsequent encounter for closed fracture with delayed healing    Pain in knee region after total knee replacement (HCC) 03/22/2020   Arthritis of left knee 07/01/2019   Arthritis 04/20/2019   B12 deficiency 03/23/2019   Anemia 02/05/2019   Rheumatoid arthritis with rheumatoid factor of multiple sites without organ or systems involvement (HCC) 01/25/2019   High risk medication use 01/25/2019   Fatigue 09/18/2018   Encounter for general adult medical examination with abnormal findings 02/07/2015   Hyperlipidemia associated with type 2 diabetes mellitus (HCC) 07/28/2009   Osteoporosis 03/14/2009   Diabetes mellitus type 2 with complications (HCC) 11/14/2006   Hypothyroidism 09/24/2006   Essential hypertension 09/24/2006    Past Medical History:  Diagnosis Date   Anemia    Arthritis    Bronchitis    hx of   Diabetes mellitus    type 2   Family history of adverse reaction to anesthesia    per patient, "  daughter threw up after a knee surgery"   GERD (gastroesophageal reflux disease)    Hypercholesteremia    Hypertension    Hypothyroidism    Pneumonia    hx of   PONV (postoperative nausea and vomiting)    Thyroid disease     Family History  Problem Relation Age of Onset   Diabetes Mother    Heart disease Mother    Hyperlipidemia Mother    Diabetes Father     Diabetes Sister    Hypertension Sister    Heart disease Sister    Dementia Sister    Heart disease Brother    Healthy Daughter    Healthy Daughter    Healthy Daughter    Healthy Son    Past Surgical History:  Procedure Laterality Date   ABDOMINAL HYSTERECTOMY     partial   BREAST SURGERY     lumpectomy left breast   carpel tunnel     COLONOSCOPY  07/2020   COLONOSCOPY W/ POLYPECTOMY     FOOT SURGERY     knee arhtroscopy     KNEE ARTHROPLASTY Left 05/2020   KYPHOPLASTY N/A 04/08/2022   Procedure: KYPHOPLASTY L1;  Surgeon: Venita Lick, MD;  Location: [facility] OR;  Service: Orthopedics;  Laterality: N/A;  [facility] with [facility]   ORIF PATELLA Left 05/23/2020   Procedure: LEFT KNEE POLE PATELLA EXCISION;  Surgeon: Cammy Copa, MD;  Location: [facility] OR;  Service: Orthopedics;  Laterality: Left;   SHOULDER ARTHROSCOPY WITH SUBACROMIAL DECOMPRESSION, ROTATOR CUFF REPAIR AND BICEP TENDON REPAIR Right 05/26/2012   Procedure: Right Shoulder Diagnostic Operative Arthroscopy, Subacromial Decompression, Biceps Tenodesis, Mini Open Rotator Cuff Repair;  Surgeon: Cammy Copa, MD;  Location: [facility] OR;  Service: Orthopedics;  Laterality: Right;  Right Shoulder Diagnostic Operative Arthroscopy, Subacromial Decompression, Biceps Tenodesis, Mini Open Rotator Cuff Repair   TONSILLECTOMY     TOTAL KNEE ARTHROPLASTY Left 07/01/2019   Procedure: LEFT TOTAL KNEE ARTHROPLASTY;  Surgeon: Cammy Copa, MD;  Location: [facility] OR;  Service: Orthopedics;  Laterality: Left;   UPPER GI ENDOSCOPY  07/2020   Social History   Social History Narrative   Not on file   Immunization History  Administered Date(s) Administered   Fluad Quad(high Dose 65+) 01/02/2019, 12/31/2019, 12/24/2022   Influenza Split 01/03/2011   Influenza Whole 04/01/2005, 12/18/2009   Influenza, High Dose Seasonal PF 01/07/2014, 01/27/2015, 01/02/2016, 01/13/2017, 01/16/2018   Influenza-Unspecified 11/30/2012, 01/03/2021, 01/08/2022    PFIZER Comirnaty(Gray Top)Covid-19 Tri-Sucrose Vaccine 12/24/2022   PFIZER(Purple Top)SARS-COV-2 Vaccination 05/28/2019, 06/23/2019, 01/15/2020, 07/03/2020, 01/03/2021   PNEUMOCOCCAL CONJUGATE-20 05/10/2021   Pfizer(Comirnaty)Fall Seasonal Vaccine 12 years and older 01/08/2022   Pneumococcal Conjugate-13 09/12/2016, 01/02/2019   Pneumococcal Polysaccharide-23 07/15/2014, 12/27/2019   Respiratory Syncytial Virus Vaccine,Recomb Aduvanted(Arexvy) 01/08/2022   Td 04/01/2001, 10/07/2013   Zoster Recombinant(Shingrix) 01/09/2021, 03/12/2021     Objective: Vital Signs: BP (!) 142/82 (BP Location: Left Arm, Patient Position: Sitting, Cuff Size: Normal)   Pulse 73   Resp 13   Ht 5\' 5"  (1.651 m)   Wt 198 lb (89.8 kg)   BMI 32.95 kg/m    Physical Exam Vitals and nursing note reviewed.  Constitutional:      Appearance: She is well-developed.  HENT:     Head: Normocephalic and atraumatic.  Eyes:     Conjunctiva/sclera: Conjunctivae normal.  Cardiovascular:     Rate and Rhythm: Normal rate and regular rhythm.     Heart sounds: Normal heart sounds.  Pulmonary:     Effort:  Pulmonary effort is normal.     Breath sounds: Normal breath sounds.  Abdominal:     General: Bowel sounds are normal.     Palpations: Abdomen is soft.  Musculoskeletal:     Cervical back: Normal range of motion.  Lymphadenopathy:     Cervical: No cervical adenopathy.  Skin:    General: Skin is warm and dry.     Capillary Refill: Capillary refill takes less than 2 seconds.  Neurological:     Mental Status: She is alert and oriented to person, place, and time.  Psychiatric:        Behavior: Behavior normal.      Musculoskeletal Exam: C-spine has good range of motion.  Thoracic kyphosis noted.  Limited mobility of the lumbar spine.  Shoulder joints, elbow joints, wrist joints, MCPs, PIPs, DIPs have good range of motion with no synovitis.  CMC, PIP, DIP thickening consistent with osteoarthritis of both hands.   Tenderness over the right CMC joint.  Left ring trigger finger noted.  Complete fist formation bilaterally.  Left knee replacement has good range of motion with some discomfort.  Right knee joint has good range of motion no warmth or effusion.  No tenderness or swelling of ankle joints.  CDAI Exam: CDAI Score: -- Patient Global: --; Provider Global: -- Swollen: --; Tender: -- Joint Exam 05/20/2023   No joint exam has been documented for this visit   There is currently no information documented on the homunculus. Go to the Rheumatology activity and complete the homunculus joint exam.  Investigation: No additional findings.  Imaging: No results found.  Recent Labs: Lab Results  Component Value Date   WBC 10.1 05/01/2023   HGB 11.3 (L) 05/01/2023   PLT 388 05/01/2023   NA 141 05/12/2023   K 4.3 05/12/2023   CL 103 05/12/2023   CO2 29 05/12/2023   GLUCOSE 94 05/12/2023   BUN 13 05/12/2023   CREATININE 0.70 05/12/2023   BILITOT 0.2 05/01/2023   ALKPHOS 45 07/30/2021   AST 13 05/01/2023   ALT 16 05/01/2023   PROT 6.3 05/01/2023   ALBUMIN 4.0 07/30/2021   CALCIUM 10.0 05/12/2023   GFRAA 101 09/18/2020   QFTBGOLDPLUS NEGATIVE 01/09/2021    Speciality Comments: PLQ Eye Exam: 12/22/2019 WNL @ [facility] follow up in 6 months  Methotrexate-hair loss  Procedures:  No procedures performed Allergies: Contrast media [iodinated contrast media], Adhesive [tape], Erythromycin, and Penicillins     Assessment / Plan:     Visit Diagnoses: Rheumatoid arthritis involving multiple sites with positive rheumatoid factor (HCC) -She has no synovitis on examination today.  She has not had any signs or symptoms of a rheumatoid arthritis flare.  She has clinically been doing well taking Arava 20 mg 1 tablet by mouth daily.  She is tolerating Arava without any side effects and has not had any recent gaps in therapy.  No recent or recurrent infections.  She continues to have chronic pain in  her lower back and intermittent discomfort in her hands due to underlying osteoarthritis.  No medication changes will be made at this time.  She was advised to notify us if she develops signs or symptoms of a flare.  She will follow-up in the office in 5 months or sooner if needed.  Plan: Lipid panel  High risk medication use - Arava 20 mg 1 tablet by mouth daily. CBC and CMP updated on 05/01/23. BMP updated on 05/12/23.  She will require updated lab work  at the end of April/early May 2025 and then every 3 months to monitor for drug toxicity.  Standing orders for CBC and CMP remain in place. Future order for Lipid panel placed today.  No recent or recurrent infections. Discussed the importance of holding arava if she develops signs or symptoms of an infection and to resume once the infection has completely cleared.  - Plan: Lipid panel  Primary osteoarthritis of both hands: CMC, PIP, DIP thickening consistent with osteoarthritis of both hands.  Patient experiences intermittent soreness in both hands particularly in the right [facility] joint.  Discussed the importance of joint protection and muscle strengthening.  No synovitis was noted on examination today.  Trochanteric bursitis, left hip: Intermittent discomfort.  S/P total knee replacement, left: Chronic pain.  Primary osteoarthritis of both feet: She is not experiencing any increased discomfort in her feet at this time.  History of osteopenia - DEXA: 04/23/2021 T-score: -0.6, BMD: 1.009  AP Spine which was within normal limits--ordered by gynecologist.  She is taking a calcium vitamin D supplement daily.  History of vertebral fracture - L1 compression fracture after a fall in January 2024.  She underwent a kyphoplasty on 04/08/2022.  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  Patient was advised to monitor blood pressure closely to reach out to PCP if blood pressure remains elevated.  History of hypothyroidism  Hyperlipidemia associated with type 2 diabetes mellitus (HCC) - Plan: Lipid panel  Demyelinating neuropathy  Orders: Orders Placed This Encounter  Procedures   Lipid panel   No orders of the defined types were placed in this encounter.    Follow-Up Instructions: Return in about 5 months (around 10/17/2023) for Rheumatoid arthritis.   Gearldine Bienenstock, PA-C  Note - This record has been created using Dragon software.  Chart creation errors have been sought, but may not always  have been located. Such creation errors do not reflect on  the standard of medical care.

## 2023-05-08 ENCOUNTER — Other Ambulatory Visit: Payer: Self-pay

## 2023-05-08 DIAGNOSIS — E118 Type 2 diabetes mellitus with unspecified complications: Secondary | ICD-10-CM

## 2023-05-12 ENCOUNTER — Other Ambulatory Visit: Payer: Medicare Other

## 2023-05-12 DIAGNOSIS — E118 Type 2 diabetes mellitus with unspecified complications: Secondary | ICD-10-CM | POA: Diagnosis not present

## 2023-05-12 LAB — HEMOGLOBIN A1C
Hgb A1c MFr Bld: 6.1 %{Hb} — ABNORMAL HIGH (ref <5.7)
Mean Plasma Glucose: 128 mg/dL
eAG (mmol/L): 7.1 mmol/L

## 2023-05-12 LAB — BASIC METABOLIC PANEL
BUN: 13 mg/dL (ref 7–25)
CO2: 29 mmol/L (ref 20–32)
Calcium: 10 mg/dL (ref 8.6–10.4)
Chloride: 103 mmol/L (ref 98–110)
Creat: 0.7 mg/dL (ref 0.60–1.00)
Glucose, Bld: 94 mg/dL (ref 65–99)
Potassium: 4.3 mmol/L (ref 3.5–5.3)
Sodium: 141 mmol/L (ref 135–146)

## 2023-05-13 ENCOUNTER — Ambulatory Visit: Payer: BC Managed Care – PPO | Admitting: Podiatry

## 2023-05-13 ENCOUNTER — Encounter: Payer: Self-pay | Admitting: Endocrinology

## 2023-05-14 ENCOUNTER — Ambulatory Visit (INDEPENDENT_AMBULATORY_CARE_PROVIDER_SITE_OTHER): Payer: Medicare Other | Admitting: Podiatry

## 2023-05-14 ENCOUNTER — Encounter: Payer: Self-pay | Admitting: Podiatry

## 2023-05-14 VITALS — Ht 65.0 in | Wt 188.0 lb

## 2023-05-14 DIAGNOSIS — M79675 Pain in left toe(s): Secondary | ICD-10-CM | POA: Diagnosis not present

## 2023-05-14 DIAGNOSIS — Q828 Other specified congenital malformations of skin: Secondary | ICD-10-CM

## 2023-05-14 DIAGNOSIS — B351 Tinea unguium: Secondary | ICD-10-CM | POA: Diagnosis not present

## 2023-05-14 DIAGNOSIS — Z794 Long term (current) use of insulin: Secondary | ICD-10-CM

## 2023-05-14 DIAGNOSIS — E119 Type 2 diabetes mellitus without complications: Secondary | ICD-10-CM | POA: Diagnosis not present

## 2023-05-14 DIAGNOSIS — M79674 Pain in right toe(s): Secondary | ICD-10-CM

## 2023-05-16 ENCOUNTER — Encounter: Payer: Self-pay | Admitting: Endocrinology

## 2023-05-16 ENCOUNTER — Ambulatory Visit (INDEPENDENT_AMBULATORY_CARE_PROVIDER_SITE_OTHER): Payer: Medicare Other | Admitting: Endocrinology

## 2023-05-16 VITALS — BP 150/92 | HR 84 | Resp 20 | Ht 65.0 in | Wt 196.0 lb

## 2023-05-16 DIAGNOSIS — Z794 Long term (current) use of insulin: Secondary | ICD-10-CM

## 2023-05-16 DIAGNOSIS — E063 Autoimmune thyroiditis: Secondary | ICD-10-CM | POA: Diagnosis not present

## 2023-05-16 DIAGNOSIS — E118 Type 2 diabetes mellitus with unspecified complications: Secondary | ICD-10-CM

## 2023-05-16 DIAGNOSIS — E1165 Type 2 diabetes mellitus with hyperglycemia: Secondary | ICD-10-CM

## 2023-05-16 DIAGNOSIS — I10 Essential (primary) hypertension: Secondary | ICD-10-CM | POA: Diagnosis not present

## 2023-05-16 MED ORDER — TRESIBA FLEXTOUCH 200 UNIT/ML ~~LOC~~ SOPN: PEN_INJECTOR | SUBCUTANEOUS | 4 refills | Status: DC

## 2023-05-16 MED ORDER — DEXCOM G6 TRANSMITTER MISC: 3 refills | Status: DC

## 2023-05-16 MED ORDER — DEXCOM G6 SENSOR MISC: 3 refills | Status: DC

## 2023-05-16 MED ORDER — FARXIGA 5 MG PO TABS: 5.0000 mg | ORAL_TABLET | Freq: Every day | ORAL | 3 refills | Status: DC

## 2023-05-16 MED ORDER — DEXCOM G7 SENSOR MISC: 1.0000 | 0 refills | Status: AC

## 2023-05-16 MED ORDER — BD PEN NEEDLE NANO U/F 32G X 4 MM MISC: 2 refills | Status: AC

## 2023-05-16 MED ORDER — METFORMIN HCL 1000 MG PO TABS: 1000.0000 mg | ORAL_TABLET | Freq: Two times a day (BID) | ORAL | 3 refills | Status: AC

## 2023-05-16 MED ORDER — LEVOTHYROXINE SODIUM 112 MCG PO TABS: ORAL_TABLET | ORAL | 3 refills | Status: AC

## 2023-05-16 MED ORDER — NOVOLOG FLEXPEN 100 UNIT/ML ~~LOC~~ SOPN: PEN_INJECTOR | SUBCUTANEOUS | 4 refills | Status: AC

## 2023-05-16 MED ORDER — VALSARTAN 320 MG PO TABS: 320.0000 mg | ORAL_TABLET | Freq: Every day | ORAL | 3 refills | Status: AC

## 2023-05-16 NOTE — Patient Instructions (Signed)
Diabetes regimen:  Decrease tresiba 24 units in the morning. Metformin 1000 mg two times a day. Farxiga 5mg  daily. Novolog sliding scale before meals.  Mild Sliding Scale Blood Glucose        Insulin 60-150                     None 151-200                   1 unit 201-250                   2 units 251-300                   4 units 301-350                   6 units 351-400                   8 units      >400                        9 units and call provider   Take glucose tablet, over the counter for low sugar 2-4 tablets at a time.

## 2023-05-16 NOTE — Progress Notes (Addendum)
Outpatient Endocrinology Note Marissa Lanson Randle, MD  05/16/23  Patient's Name: Marissa Roberts    DOB: 02/14/1946    MRN: [id number]                                                    REASON OF VISIT: Follow up of type 2 diabetes mellitus / Hypothyroidism  PCP: Marissa Broker, MD  HISTORY OF PRESENT ILLNESS:   Marissa Roberts is a 78 y.o. old female with past medical history listed below, is here for follow up for type 2 diabetes mellitus.   Pertinent Diabetes History: Patient was diagnosed with type 2 diabetes mellitus in 2005.  Patient has controlled type 2 diabetes mellitus.  Chronic Diabetes Complications : Retinopathy: no. Last ophthalmology exam was done on annuallyy, following with ophthalmology regularly.  Nephropathy: yes, microalbuminuria present., on ACE/ARB /valsartan. Peripheral neuropathy: no Coronary artery disease: no Stroke: on  Relevant comorbidities and cardiovascular risk factors: Obesity: yes Body mass index is 32.62 kg/m.  Hypertension: Yes  Hyperlipidemia : Yes, on statin   Current / Home Diabetic regimen includes: Tresiba 26 units in the morning.  Novolog occasionally 1 - 5 units per sliding scale.  Metformin 1000 mg 2 times a day. Farxiga 5 mg daily.  Prior diabetic medications: Kombiglyze, Byetta.  V-Go pump in the past, A stopped due to cost, skin irritation and discomfort.  She had taken Lantus in the past.  Glycemic data:    CONTINUOUS GLUCOSE MONITORING SYSTEM (CGMS) INTERPRETATION: At today's visit, we reviewed CGM downloads. The full report is scanned in the media. Reviewing the CGM trends, blood glucose are as follows:  Dexcom  CGM-  Sensor Download (Sensor download was reviewed and summarized below.) Dates: February 1 to May 16, 2023, 14 days  Glucose Management Indicator: 6.0%    Interpretation: Mostly acceptable blood sugar.  Blood sugar overnight in the low normal range and rare overnight hypoglycemia with blood  sugar in 60s.  No concerning hyperglycemia.  Blood sugar in the afternoon and mealtimes are acceptable.  Hypoglycemia: Patient has frequent hypoglycemic episodes. Patient has hypoglycemia awareness.  Factors modifying glucose control: 1.  Diabetic diet assessment: 3 meals a day.  2.  Staying active or exercising: No formal exercise.  3.  Medication compliance: compliant all of the time.  # Primary hypothyroidism -She has been hypothyroid for 40+ years.  She has been taking levothyroxine since her diagnosis.  She is currently taking levothyroxine 112 mcg daily.  Interval history  CGM data as reviewed above.  Mostly acceptable blood sugar.  She has complaints of unusual feeling probably numbness around the left leg area, no numbness and tingling in the feet, advised to discuss with primary care provider and ?  Neurosurgery, taking care of for back issues.  She has itching rash in the left lateral leg and using nystatin and triamcinolone cream.  No change in vision.  She has been taking levothyroxine 112 mcg daily.  No hypo or hyperthyroid symptoms.  Hemoglobin A1c 6.1%.  Electrolytes and renal function is stable.  Patient reports he has back problem, had a steroid injection in last month in January.  She has noticed hyperglycemia during steroid injection.  REVIEW OF SYSTEMS As per history of present illness.   PAST MEDICAL HISTORY: Past Medical History:  Diagnosis Date   Anemia  Arthritis    Bronchitis    hx of   Diabetes mellitus    type 2   Family history of adverse reaction to anesthesia    per patient, "daughter threw up after a knee surgery"   GERD (gastroesophageal reflux disease)    Hypercholesteremia    Hypertension    Hypothyroidism    Pneumonia    hx of   PONV (postoperative nausea and vomiting)    Thyroid disease     PAST SURGICAL HISTORY: Past Surgical History:  Procedure Laterality Date   ABDOMINAL HYSTERECTOMY     partial   BREAST SURGERY      lumpectomy left breast   carpel tunnel     COLONOSCOPY  07/2020   COLONOSCOPY W/ POLYPECTOMY     FOOT SURGERY     knee arhtroscopy     KNEE ARTHROPLASTY Left 05/2020   KYPHOPLASTY N/A 04/08/2022   Procedure: KYPHOPLASTY L1;  Surgeon: Venita Lick, MD;  Location: [facility] OR;  Service: Orthopedics;  Laterality: N/A;  [facility] with [facility]   ORIF PATELLA Left 05/23/2020   Procedure: LEFT KNEE POLE PATELLA EXCISION;  Surgeon: Cammy Copa, MD;  Location: [facility] OR;  Service: Orthopedics;  Laterality: Left;   SHOULDER ARTHROSCOPY WITH SUBACROMIAL DECOMPRESSION, ROTATOR CUFF REPAIR AND BICEP TENDON REPAIR Right 05/26/2012   Procedure: Right Shoulder Diagnostic Operative Arthroscopy, Subacromial Decompression, Biceps Tenodesis, Mini Open Rotator Cuff Repair;  Surgeon: Cammy Copa, MD;  Location: [facility] OR;  Service: Orthopedics;  Laterality: Right;  Right Shoulder Diagnostic Operative Arthroscopy, Subacromial Decompression, Biceps Tenodesis, Mini Open Rotator Cuff Repair   TONSILLECTOMY     TOTAL KNEE ARTHROPLASTY Left 07/01/2019   Procedure: LEFT TOTAL KNEE ARTHROPLASTY;  Surgeon: Cammy Copa, MD;  Location: [facility] OR;  Service: Orthopedics;  Laterality: Left;   UPPER GI ENDOSCOPY  07/2020    ALLERGIES: Allergies  Allergen Reactions   Contrast Media [Iodinated Contrast Media]     Throat swells from ct contra   Adhesive [Tape]     No paper tape, causes blisters   Erythromycin Rash   Penicillins Rash    Did it involve swelling of the face/tongue/throat, SOB, or low BP? No Did it involve sudden or severe rash/hives, skin peeling, or any reaction on the inside of your mouth or nose? No Did you need to seek medical attention at a hospital or doctor's office? No When did it last happen?    Many years If all above answers are "NO", may proceed with cephalosporin use.     FAMILY HISTORY:  Family History  Problem Relation Age of Onset   Diabetes Mother    Heart disease Mother     Hyperlipidemia Mother    Diabetes Father    Diabetes Sister    Hypertension Sister    Heart disease Sister    Dementia Sister    Heart disease Brother    Healthy Daughter    Healthy Daughter    Healthy Daughter    Healthy Son     SOCIAL HISTORY: Social History   Socioeconomic History   Marital status: Married    Spouse name: Not on file   Number of children: Not on file   Years of education: Not on file   Highest education level: Not on file  Occupational History   Not on file  Tobacco Use   Smoking status: Former    Current packs/day: 0.00    Types: Cigarettes    Quit date: 04/01/1990  Years since quitting: 33.1    Passive exposure: Never   Smokeless tobacco: Never  Vaping Use   Vaping status: Never Used  Substance and Sexual Activity   Alcohol use: Not Currently    Comment: 06/22/2019: per patient about 20-25 years ago   Drug use: No   Sexual activity: Never    Birth control/protection: Abstinence  Other Topics Concern   Not on file  Social History Narrative   Not on file   Social Drivers of Health   Financial Resource Strain: Low Risk  (01/21/2023)   Overall Financial Resource Strain (CARDIA)    Difficulty of Paying Living Expenses: Not hard at all  Food Insecurity: No Food Insecurity (01/21/2023)   Hunger Vital Sign    Worried About Running Out of Food in the Last Year: Never true    Ran Out of Food in the Last Year: Never true  Transportation Needs: No Transportation Needs (01/21/2023)   PRAPARE - Administrator, Civil Service (Medical): No    Lack of Transportation (Non-Medical): No  Physical Activity: Inactive (01/21/2023)   Exercise Vital Sign    Days of Exercise per Week: 0 days    Minutes of Exercise per Session: 0 min  Stress: No Stress Concern Present (01/21/2023)   [employer] of Occupational Health - Occupational Stress Questionnaire    Feeling of Stress : Not at all  Social Connections: Socially Integrated (01/21/2023)    Social Connection and Isolation Panel [NHANES]    Frequency of Communication with Friends and Family: More than three times a week    Frequency of Social Gatherings with Friends and Family: More than three times a week    Attends Religious Services: More than 4 times per year    Active Member of [employer] or Organizations: Yes    Attends Engineer, structural: More than 4 times per year    Marital Status: Married    MEDICATIONS:  Current Outpatient Medications  Medication Sig Dispense Refill   Continuous Glucose Sensor (DEXCOM G7 SENSOR) MISC 1 Device by Does not apply route continuous. Change every 10 days 9 each 0   acetaminophen (TYLENOL) 325 MG tablet Take 2 tablets (650 mg total) by mouth every 6 (six) hours as needed for moderate pain. 30 tablet 0   amLODipine (NORVASC) 10 MG tablet Take 1 tablet by mouth once daily 90 tablet 3   Biotin [address] MCG TABS Take 10,000 mcg by mouth daily.     Calcium Carb-Cholecalciferol (CALCIUM 600 + D PO) Take 1 tablet by mouth daily.     Continuous Blood Gluc Receiver (DEXCOM G6 RECEIVER) DEVI For sensor monitoring 1 each 0   Continuous Glucose Sensor (DEXCOM G6 SENSOR) MISC Use to monitor blood sugar, change after 10 days 9 each 3   Continuous Glucose Transmitter (DEXCOM G6 TRANSMITTER) MISC 4 PER YEAR 1 each 3   diclofenac Sodium (VOLTAREN) 1 % GEL Apply topically as needed.     FARXIGA 5 MG TABS tablet Take 1 tablet (5 mg total) by mouth daily. 90 tablet 3   fluticasone (FLONASE) 50 MCG/ACT nasal spray Place 2 sprays into both nostrils 2 (two) times daily as needed for allergies or rhinitis.     furosemide (LASIX) 40 MG tablet Take 2 tablets by mouth once daily 180 tablet 0   Glucagon (GVOKE HYPOPEN 1-PACK) 1 MG/0.2ML SOAJ Inject 1 mg into the skin as needed (low blood sugar with impaired consciousness). 0.4 mL 2   Glucosamine HCl-MSM (  GLUCOSAMINE-MSM PO) Take 1 tablet by mouth daily.     glucose blood (ONETOUCH VERIO) test strip USE 1 STRIP TO  CHECK GLUCOSE THREE TIMES DAILY 2 HOURS AFTER A MEAL AND 1 THREE TIMES A WEEK UPON WAKING UP 250 each 2   Insulin Pen Needle (BD PEN NEEDLE NANO U/F) 32G X 4 MM MISC USE 1  4 TIMES DAILY TO  INJECT  INSULIN 200 each 2   latanoprost (XALATAN) 0.005 % ophthalmic solution SMARTSIG:1 Drop(s) In Eye(s) Every Evening     leflunomide (ARAVA) 20 MG tablet Take 1 tablet by mouth once daily 90 tablet 0   levothyroxine (SYNTHROID) 112 MCG tablet TAKE 1 TABLET BY MOUTH BEFORE BREAKFAST 90 tablet 3   LINZESS 290 MCG CAPS capsule TAKE 1 CAPSULE BY MOUTH ONCE DAILY AS NEEDED 90 capsule 0   Magnesium 100 MG CAPS Take 100 mg by mouth daily.     metFORMIN (GLUCOPHAGE) 1000 MG tablet Take 1 tablet (1,000 mg total) by mouth 2 (two) times daily with a meal. 180 tablet 3   montelukast (SINGULAIR) 10 MG tablet TAKE 1 TABLET BY MOUTH AT BEDTIME 90 tablet 0   NOVOLOG FLEXPEN 100 UNIT/ML FlexPen INJECT as per sliding scale as instructed maximum 20 units/ day SUBCUTANEOUSLY THREE TIMES DAILY BEFORE MEAL(S) 15 mL 4   nystatin-triamcinolone ointment (MYCOLOG) Apply to feet twice daily for 4 weeks. 60 g 1   omeprazole (PRILOSEC) 40 MG capsule Take 40 mg by mouth daily.     ondansetron (ZOFRAN) 4 MG tablet Take 1 tablet (4 mg total) by mouth every 8 (eight) hours as needed for nausea or vomiting. 20 tablet 0   OneTouch Delica Lancets 33G MISC USE TO CHECK BLOOD SUGAR UP TO THREE TIMES A DAY, 2 HOURS AFTER A MEAL AND UP TO 3 TIMES A WEEK UPON WAKING UP 300 each 0   potassium chloride (KLOR-CON) 10 MEQ tablet Take 1 tablet (10 mEq total) by mouth daily. 90 tablet 0   rosuvastatin (CRESTOR) 40 MG tablet Take 1 tablet by mouth once daily 90 tablet 0   traMADol (ULTRAM) 50 MG tablet Take 50 mg by mouth every 6 (six) hours as needed.     TRESIBA FLEXTOUCH 200 UNIT/ML FlexTouch Pen INJECT 24 UNITS SUBCUTANEOUSLY IN THE MORNING 9 mL 4   valsartan (DIOVAN) 320 MG tablet Take 1 tablet (320 mg total) by mouth daily. 90 tablet 3   No  current facility-administered medications for this visit.    PHYSICAL EXAM: Vitals:   05/16/23 0859 05/16/23 0900  BP: (!) 146/90 (!) 150/92  Pulse: 84   Resp: 20   SpO2: 97%   Weight: 196 lb (88.9 kg)   Height: 5\' 5"  (1.651 m)    Body mass index is 32.62 kg/m.  Wt Readings from Last 3 Encounters:  05/16/23 196 lb (88.9 kg)  05/14/23 188 lb (85.3 kg)  02/11/23 188 lb 9.6 oz (85.5 kg)    General: Well developed, well nourished female in no apparent distress.  HEENT: AT/[place], no external lesions.  Eyes: Conjunctiva clear and no icterus. Neck: Neck supple  Lungs: Respirations not labored Neurologic: Alert, oriented, normal speech Extremities / Skin: Dry. No sores or rashes noted.  Psychiatric: Does not appear depressed or anxious  Diabetic Foot Exam - Simple   Simple Foot Form Diabetic Foot exam was performed with the following findings: Yes 05/16/2023  9:23 AM  Visual Inspection No deformities, no ulcerations, no other skin breakdown bilaterally: Yes See comments:  Yes Sensation Testing Intact to touch and monofilament testing bilaterally: Yes Pulse Check Posterior Tibialis and Dorsalis pulse intact bilaterally: Yes Comments     LABS Reviewed Lab Results  Component Value Date   HGBA1C 6.1 (H) 05/12/2023   HGBA1C 5.7 02/06/2023   HGBA1C 5.7 10/01/2022   Lab Results  Component Value Date   FRUCTOSAMINE 194 07/30/2021   Lab Results  Component Value Date   CHOL 150 07/05/2022   HDL 50.70 07/05/2022   LDLCALC 72 07/05/2022   LDLDIRECT 62.0 05/18/2021   TRIG 138.0 07/05/2022   CHOLHDL 3 07/05/2022   Lab Results  Component Value Date   MICRALBCREAT 36.1 (H) 07/05/2022   MICRALBCREAT 17.3 05/30/2021   Lab Results  Component Value Date   CREATININE 0.70 05/12/2023   Lab Results  Component Value Date   GFR 85.48 02/06/2023    ASSESSMENT / PLAN  1. Controlled type 2 diabetes mellitus with complication, with long-term current use of insulin (HCC)   2.  Essential hypertension   3. Diabetes mellitus type 2 with complications (HCC)   4. Acquired autoimmune hypothyroidism   5. Type 2 diabetes mellitus with hyperglycemia, with long-term current use of insulin (HCC)     Diabetes Mellitus type 2, complicated by microalbuminuria. - Diabetic status / severity: Controlled with hypoglycemia.  Lab Results  Component Value Date   HGBA1C 6.1 (H) 05/12/2023    - Hemoglobin A1c goal : <7%  She has low normal glucose to rare hypoglycemia overnight otherwise acceptable blood sugar.  - Medications: See below.  Diabetes regimen:  Decrease tresiba from 26 units to 24 units in the morning. Metformin 1000 mg two times a day. Farxiga 5mg  daily.  Novolog sliding scale before meals.  Mild Sliding Scale Blood Glucose        Insulin 60-150                     None 151-200                   1 unit 201-250                   2 units 251-300                   4 units 301-350                   6 units 351-400                   8 units      >400                        9 units and call provider  - Home glucose testing: CGM Dexcom G and check as needed. - Discussed/ Gave Hypoglycemia treatment plan.  Advised to use glucose tablet for correcting hypoglycemia.  She has Glucagon Emergency Kit at home.  # Consult : not required at this time.   # Annual urine for microalbuminuria/ creatinine ratio, no microalbuminuria currently, continue ACE/ARB /valsartan. Last  Lab Results  Component Value Date   MICRALBCREAT 36.1 (H) 07/05/2022    # Foot check nightly.  # Annual dilated diabetic eye exams.   - Diet: Make healthy diabetic food choices - Life style / activity / exercise: Discussed.  2. Blood pressure  -  BP Readings from Last 1 Encounters:  05/16/23 (!) 150/92    - Control is not  in target. She has not taken blood pressure medicine today. - No change in current plans.   3. Lipid status / Hyperlipidemia - Last  Lab Results  Component  Value Date   LDLCALC 72 07/05/2022   - Continue rosuvastatin 40 mg daily.  # Primary hypothyroidism -Continue levothyroxine 112 mcg daily. -Check thyroid lab prior to follow-up visit.  Diagnoses and all orders for this visit:  Controlled type 2 diabetes mellitus with complication, with long-term current use of insulin (HCC) -     Insulin Pen Needle (BD PEN NEEDLE NANO U/F) 32G X 4 MM MISC; USE 1  4 TIMES DAILY TO  INJECT  INSULIN -     metFORMIN (GLUCOPHAGE) 1000 MG tablet; Take 1 tablet (1,000 mg total) by mouth 2 (two) times daily with a meal. -     NOVOLOG FLEXPEN 100 UNIT/ML FlexPen; INJECT as per sliding scale as instructed maximum 20 units/ day SUBCUTANEOUSLY THREE TIMES DAILY BEFORE MEAL(S) -     Continuous Glucose Sensor (DEXCOM G7 SENSOR) MISC; 1 Device by Does not apply route continuous. Change every 10 days  Essential hypertension -     valsartan (DIOVAN) 320 MG tablet; Take 1 tablet (320 mg total) by mouth daily.  Diabetes mellitus type 2 with complications (HCC) -     BASIC METABOLIC PANEL WITH GFR -     Hemoglobin A1c -     Lipid panel -     Microalbumin / creatinine urine ratio -     FARXIGA 5 MG TABS tablet; Take 1 tablet (5 mg total) by mouth daily. -     TRESIBA FLEXTOUCH 200 UNIT/ML FlexTouch Pen; INJECT 24 UNITS SUBCUTANEOUSLY IN THE MORNING  Acquired autoimmune hypothyroidism -     T4, free -     TSH -     levothyroxine (SYNTHROID) 112 MCG tablet; TAKE 1 TABLET BY MOUTH BEFORE BREAKFAST  Type 2 diabetes mellitus with hyperglycemia, with long-term current use of insulin (HCC) -     Continuous Glucose Sensor (DEXCOM G6 SENSOR) MISC; Use to monitor blood sugar, change after 10 days -     Continuous Glucose Transmitter (DEXCOM G6 TRANSMITTER) MISC; 4 PER YEAR   DISPOSITION Follow up in clinic in 3 months suggested.   All questions answered and patient verbalized understanding of the plan.  Marissa Zyionna Pesce, MD [facility] Endocrinology [facility]  [facility] [address] [place], [address] [place], [place] [address] Phone # [phone]  At least part of this note was generated using voice recognition software. Inadvertent word errors may have occurred, which were not recognized during the proofreading process.

## 2023-05-20 ENCOUNTER — Encounter: Payer: Self-pay | Admitting: Physician Assistant

## 2023-05-20 ENCOUNTER — Ambulatory Visit: Payer: Medicare Other | Attending: Physician Assistant | Admitting: Physician Assistant

## 2023-05-20 ENCOUNTER — Other Ambulatory Visit: Payer: Self-pay | Admitting: Internal Medicine

## 2023-05-20 VITALS — BP 142/82 | HR 73 | Resp 13 | Ht 65.0 in | Wt 198.0 lb

## 2023-05-20 DIAGNOSIS — G629 Polyneuropathy, unspecified: Secondary | ICD-10-CM

## 2023-05-20 DIAGNOSIS — M7062 Trochanteric bursitis, left hip: Secondary | ICD-10-CM | POA: Diagnosis not present

## 2023-05-20 DIAGNOSIS — Z8639 Personal history of other endocrine, nutritional and metabolic disease: Secondary | ICD-10-CM

## 2023-05-20 DIAGNOSIS — Z79899 Other long term (current) drug therapy: Secondary | ICD-10-CM | POA: Diagnosis not present

## 2023-05-20 DIAGNOSIS — M19042 Primary osteoarthritis, left hand: Secondary | ICD-10-CM

## 2023-05-20 DIAGNOSIS — M19072 Primary osteoarthritis, left ankle and foot: Secondary | ICD-10-CM

## 2023-05-20 DIAGNOSIS — E1169 Type 2 diabetes mellitus with other specified complication: Secondary | ICD-10-CM

## 2023-05-20 DIAGNOSIS — E1144 Type 2 diabetes mellitus with diabetic amyotrophy: Secondary | ICD-10-CM

## 2023-05-20 DIAGNOSIS — Z8739 Personal history of other diseases of the musculoskeletal system and connective tissue: Secondary | ICD-10-CM

## 2023-05-20 DIAGNOSIS — Z8781 Personal history of (healed) traumatic fracture: Secondary | ICD-10-CM

## 2023-05-20 DIAGNOSIS — M19041 Primary osteoarthritis, right hand: Secondary | ICD-10-CM

## 2023-05-20 DIAGNOSIS — M0579 Rheumatoid arthritis with rheumatoid factor of multiple sites without organ or systems involvement: Secondary | ICD-10-CM

## 2023-05-20 DIAGNOSIS — I10 Essential (primary) hypertension: Secondary | ICD-10-CM

## 2023-05-20 DIAGNOSIS — E785 Hyperlipidemia, unspecified: Secondary | ICD-10-CM

## 2023-05-20 DIAGNOSIS — M19071 Primary osteoarthritis, right ankle and foot: Secondary | ICD-10-CM

## 2023-05-20 DIAGNOSIS — Z96652 Presence of left artificial knee joint: Secondary | ICD-10-CM

## 2023-05-20 DIAGNOSIS — E559 Vitamin D deficiency, unspecified: Secondary | ICD-10-CM

## 2023-05-20 NOTE — Patient Instructions (Signed)
Standing Labs We placed an order today for your standing lab work.   Please have your standing labs drawn at end of April/May and every 3 months  Please have your labs drawn 2 weeks prior to your appointment so that the provider can discuss your lab results at your appointment, if possible.  Please note that you may see your imaging and lab results in MyChart before we have reviewed them. We will contact you once all results are reviewed. Please allow our office up to 72 hours to thoroughly review all of the results before contacting the office for clarification of your results.  WALK-IN LAB HOURS  Monday through Thursday from 8:00 am -12:30 pm and 1:00 pm-5:00 pm and Friday from 8:00 am-12:00 pm.  Patients with office visits requiring labs will be seen before walk-in labs.  You may encounter longer than normal wait times. Please allow additional time. Wait times may be shorter on  Monday and Thursday afternoons.  We do not book appointments for walk-in labs. We appreciate your patience and understanding with our staff.   Labs are drawn by [facility]. Please bring your co-pay at the time of your lab draw.  You may receive a bill from [facility] for your lab work.  Please note if you are on Hydroxychloroquine and and an order has been placed for a Hydroxychloroquine level,  you will need to have it drawn 4 hours or more after your last dose.  If you wish to have your labs drawn at another location, please call the office 24 hours in advance so we can fax the orders.  The office is located at [address], [place], [place] [address]   If you have any questions regarding directions or hours of operation,  please call [phone].   As a reminder, please drink plenty of water prior to coming for your lab work. Thanks!

## 2023-05-22 ENCOUNTER — Ambulatory Visit: Payer: Medicare Other | Admitting: Physician Assistant

## 2023-05-22 ENCOUNTER — Other Ambulatory Visit: Payer: Self-pay | Admitting: Internal Medicine

## 2023-05-22 NOTE — Progress Notes (Signed)
Subjective:  Patient ID: Marissa Roberts, female    DOB: June 14, 1945,  MRN: [id number]  Marissa Roberts presents to clinic today for at risk foot care with history of diabetic neuropathy and painful porokeratotic lesion(s) right fourth digit and painful mycotic toenails that limit ambulation. Painful toenails interfere with ambulation. Aggravating factors include wearing enclosed shoe gear. Pain is relieved with periodic professional debridement. Painful porokeratotic lesions are aggravated when weightbearing with and without shoegear. Pain is relieved with periodic professional debridement.  Chief Complaint  Patient presents with   Marissa Roberts    She is here for a nail trim, PCP is Dr. Okey Dupre, last A1C was 6.1,.   New problem(s): None.   PCP is Myrlene Broker, MD.  Allergies  Allergen Reactions   Contrast Media [Iodinated Contrast Media]     Throat swells from ct contra   Adhesive [Tape]     No paper tape, causes blisters   Erythromycin Rash   Penicillins Rash    Did it involve swelling of the face/tongue/throat, SOB, or low BP? No Did it involve sudden or severe rash/hives, skin peeling, or any reaction on the inside of your mouth or nose? No Did you need to seek medical attention at a hospital or doctor's office? No When did it last happen?    Many years If all above answers are "NO", may proceed with cephalosporin use.     Review of Systems: Negative except as noted in the HPI.  Objective: No changes noted in today's physical examination. There were no vitals filed for this visit. Marissa Roberts is a pleasant 78 y.o. female in NAD. AAO x 3.  Vascular Examination: Vascular status intact b/l with palpable pedal pulses. CFT immediate b/l. No edema. No pain with calf compression b/l. Skin temperature gradient WNL b/l. No cyanosis or clubbing noted b/l LE.  Neurological Examination: Pt has subjective symptoms of neuropathy. Sensation grossly intact b/l with 10 gram  monofilament.  Dermatological Examination: Pedal skin with normal turgor, texture and tone b/l. Toenails 1-5 b/l thick, discolored, elongated with subungual debris and pain on dorsal palpation.  Porokeratotic lesion(s) R 4th toe. No erythema, no edema, no drainage, no fluctuance.  Musculoskeletal Examination: Muscle strength 5/5 to b/l LE. No pain, crepitus or joint limitation noted with ROM bilateral LE. Hammertoe(s) noted to the of both feet.  Radiographs: None  Assessment/Plan: 1. Pain due to onychomycosis of toenails of both feet   2. Porokeratosis   3. Type 2 diabetes mellitus without complication, with long-term current use of insulin (HCC)     Patient was evaluated and treated. All patient's and/or POA's questions/concerns addressed on today's visit. Mycotic toenails 1-5 debrided in length and girth without incident. Porokeratotic lesion(s) right fourth digit pared with sharp debridement without incident. Continue daily foot inspections and monitor blood glucose per PCP/Endocrinologist's recommendations. Continue soft, supportive shoe gear daily. Report any pedal injuries to medical professional. Call office if there are any questions/concerns. -Patient/POA to call should there be question/concern in the interim.   Return in about 9 weeks (around 07/16/2023).  Freddie Breech, DPM      [facility] LOCATION: [address], [place] [address]  Office [phone]   [facility] LOCATION: [address] [place], [place] [address] Office [phone]

## 2023-06-09 DIAGNOSIS — E1165 Type 2 diabetes mellitus with hyperglycemia: Secondary | ICD-10-CM | POA: Diagnosis not present

## 2023-06-10 DIAGNOSIS — M25551 Pain in right hip: Secondary | ICD-10-CM | POA: Diagnosis not present

## 2023-06-24 ENCOUNTER — Other Ambulatory Visit: Payer: Self-pay | Admitting: Endocrinology

## 2023-06-24 DIAGNOSIS — I10 Essential (primary) hypertension: Secondary | ICD-10-CM

## 2023-06-24 DIAGNOSIS — E118 Type 2 diabetes mellitus with unspecified complications: Secondary | ICD-10-CM

## 2023-06-24 NOTE — Telephone Encounter (Signed)
 Medication refill request complete

## 2023-06-30 DIAGNOSIS — M79604 Pain in right leg: Secondary | ICD-10-CM | POA: Diagnosis not present

## 2023-06-30 DIAGNOSIS — F40232 Fear of other medical care: Secondary | ICD-10-CM | POA: Insufficient documentation

## 2023-06-30 DIAGNOSIS — M5416 Radiculopathy, lumbar region: Secondary | ICD-10-CM | POA: Diagnosis not present

## 2023-07-09 ENCOUNTER — Other Ambulatory Visit: Payer: Self-pay | Admitting: Internal Medicine

## 2023-07-09 ENCOUNTER — Telehealth: Payer: Self-pay | Admitting: Internal Medicine

## 2023-07-09 NOTE — Telephone Encounter (Unsigned)
 Copied from CRM [id number]. Topic: Clinical - Medication Question >> Jul 09, 2023  9:31 AM Alcus Dad wrote: Reason for CRM: Patient is having cramps in her legs and patient is on Potassium. Patient wants to know if she needs to come in to be seen or can Dr. Diamantina Monks in some medication for patient. If no answer please leave message

## 2023-07-10 DIAGNOSIS — S92912A Unspecified fracture of left toe(s), initial encounter for closed fracture: Secondary | ICD-10-CM | POA: Diagnosis not present

## 2023-07-10 DIAGNOSIS — M79675 Pain in left toe(s): Secondary | ICD-10-CM | POA: Insufficient documentation

## 2023-07-10 NOTE — Telephone Encounter (Signed)
 Called patient and LVM advised to schedule a visit

## 2023-07-10 NOTE — Telephone Encounter (Signed)
**Note De-identified  [place] Obfuscation** Please advise 

## 2023-07-10 NOTE — Telephone Encounter (Signed)
 Last visit with me almost 1 year ago please visit

## 2023-07-14 DIAGNOSIS — S92523A Displaced fracture of medial phalanx of unspecified lesser toe(s), initial encounter for closed fracture: Secondary | ICD-10-CM | POA: Insufficient documentation

## 2023-07-15 ENCOUNTER — Ambulatory Visit: Payer: BC Managed Care – PPO | Admitting: Podiatry

## 2023-07-15 ENCOUNTER — Telehealth: Payer: Self-pay | Admitting: Internal Medicine

## 2023-07-15 ENCOUNTER — Other Ambulatory Visit: Payer: Self-pay

## 2023-07-15 MED ORDER — ROSUVASTATIN CALCIUM 40 MG PO TABS: 40.0000 mg | ORAL_TABLET | Freq: Every day | ORAL | 0 refills | Status: DC

## 2023-07-15 NOTE — Telephone Encounter (Signed)
 Pt will be coming in tomorrow for to be seen by Martha Slack due to her legs aching and night and she also will be  needing her medication. Prescription Request  07/15/2023  LOV: Visit date not found  What is the name of the medication or equipment?  rosuvastatin (CRESTOR) 40 MG tablet    Have you contacted your pharmacy to request a refill? No   Which pharmacy would you like this sent to?    [facility] [address], [place] - [address] [place] [address] Phone: [phone] Fax: [phone]   Patient notified that their request is being sent to the clinical staff for review and that they should receive a response within 2 business days.   Please advise at Mobile [phone] (mobile)

## 2023-07-15 NOTE — Telephone Encounter (Signed)
 Done

## 2023-07-16 ENCOUNTER — Encounter: Payer: Self-pay | Admitting: Podiatry

## 2023-07-16 ENCOUNTER — Ambulatory Visit (INDEPENDENT_AMBULATORY_CARE_PROVIDER_SITE_OTHER): Admitting: Podiatry

## 2023-07-16 ENCOUNTER — Encounter: Payer: Self-pay | Admitting: Family Medicine

## 2023-07-16 ENCOUNTER — Ambulatory Visit: Payer: BC Managed Care – PPO | Admitting: Podiatry

## 2023-07-16 ENCOUNTER — Ambulatory Visit: Admitting: Family Medicine

## 2023-07-16 VITALS — BP 132/70 | HR 83 | Temp 96.0°F | Ht 65.0 in | Wt 197.2 lb

## 2023-07-16 DIAGNOSIS — E119 Type 2 diabetes mellitus without complications: Secondary | ICD-10-CM

## 2023-07-16 DIAGNOSIS — M79674 Pain in right toe(s): Secondary | ICD-10-CM

## 2023-07-16 DIAGNOSIS — E1169 Type 2 diabetes mellitus with other specified complication: Secondary | ICD-10-CM | POA: Diagnosis not present

## 2023-07-16 DIAGNOSIS — M79675 Pain in left toe(s): Secondary | ICD-10-CM | POA: Diagnosis not present

## 2023-07-16 DIAGNOSIS — I10 Essential (primary) hypertension: Secondary | ICD-10-CM | POA: Diagnosis not present

## 2023-07-16 DIAGNOSIS — M0579 Rheumatoid arthritis with rheumatoid factor of multiple sites without organ or systems involvement: Secondary | ICD-10-CM | POA: Diagnosis not present

## 2023-07-16 DIAGNOSIS — B351 Tinea unguium: Secondary | ICD-10-CM | POA: Diagnosis not present

## 2023-07-16 DIAGNOSIS — M7989 Other specified soft tissue disorders: Secondary | ICD-10-CM | POA: Diagnosis not present

## 2023-07-16 DIAGNOSIS — E785 Hyperlipidemia, unspecified: Secondary | ICD-10-CM

## 2023-07-16 DIAGNOSIS — E039 Hypothyroidism, unspecified: Secondary | ICD-10-CM

## 2023-07-16 DIAGNOSIS — E118 Type 2 diabetes mellitus with unspecified complications: Secondary | ICD-10-CM

## 2023-07-16 DIAGNOSIS — Z794 Long term (current) use of insulin: Secondary | ICD-10-CM | POA: Diagnosis not present

## 2023-07-16 DIAGNOSIS — Q828 Other specified congenital malformations of skin: Secondary | ICD-10-CM

## 2023-07-16 DIAGNOSIS — M62838 Other muscle spasm: Secondary | ICD-10-CM

## 2023-07-16 LAB — LIPID PANEL
Cholesterol: 195 mg/dL (ref 0–200)
HDL: 81.1 mg/dL (ref >39.00)
LDL Cholesterol: 94 mg/dL (ref 0–99)
NonHDL: 114.24
Total CHOL/HDL Ratio: 2
Triglycerides: 101 mg/dL (ref 0.0–149.0)
VLDL: 20.2 mg/dL (ref 0.0–40.0)

## 2023-07-16 LAB — COMPREHENSIVE METABOLIC PANEL WITH GFR
ALT: 19 U/L (ref 0–35)
AST: 19 U/L (ref 0–37)
Albumin: 4.3 g/dL (ref 3.5–5.2)
Alkaline Phosphatase: 72 U/L (ref 39–117)
BUN: 13 mg/dL (ref 6–23)
CO2: 30 meq/L (ref 19–32)
Calcium: 9.5 mg/dL (ref 8.4–10.5)
Chloride: 102 meq/L (ref 96–112)
Creatinine, Ser: 0.71 mg/dL (ref 0.40–1.20)
GFR: 82.9 mL/min (ref >60.00)
Glucose, Bld: 90 mg/dL (ref 70–99)
Potassium: 4 meq/L (ref 3.5–5.1)
Sodium: 140 meq/L (ref 135–145)
Total Bilirubin: 0.3 mg/dL (ref 0.2–1.2)
Total Protein: 7.2 g/dL (ref 6.0–8.3)

## 2023-07-16 LAB — CBC WITH DIFFERENTIAL/PLATELET
Basophils Absolute: 0.1 10*3/uL (ref 0.0–0.1)
Basophils Relative: 1.3 % (ref 0.0–3.0)
Eosinophils Absolute: 0.1 10*3/uL (ref 0.0–0.7)
Eosinophils Relative: 1.3 % (ref 0.0–5.0)
HCT: 38.2 % (ref 36.0–46.0)
Hemoglobin: 12.4 g/dL (ref 12.0–15.0)
Lymphocytes Relative: 35.6 % (ref 12.0–46.0)
Lymphs Abs: 3.6 10*3/uL (ref 0.7–4.0)
MCHC: 32.6 g/dL (ref 30.0–36.0)
MCV: 87.8 fl (ref 78.0–100.0)
Monocytes Absolute: 0.9 10*3/uL (ref 0.1–1.0)
Monocytes Relative: 9.1 % (ref 3.0–12.0)
Neutro Abs: 5.3 10*3/uL (ref 1.4–7.7)
Neutrophils Relative %: 52.7 % (ref 43.0–77.0)
Platelets: 267 10*3/uL (ref 150.0–400.0)
RBC: 4.35 Mil/uL (ref 3.87–5.11)
RDW: 17.3 % — ABNORMAL HIGH (ref 11.5–15.5)
WBC: 10 10*3/uL (ref 4.0–10.5)

## 2023-07-16 LAB — MAGNESIUM: Magnesium: 2.2 mg/dL (ref 1.5–2.5)

## 2023-07-16 LAB — HEMOGLOBIN A1C: Hgb A1c MFr Bld: 6 % (ref 4.6–6.5)

## 2023-07-16 LAB — T4, FREE: Free T4: 3.4 ng/dL — ABNORMAL HIGH (ref 0.60–1.60)

## 2023-07-16 LAB — BRAIN NATRIURETIC PEPTIDE: Pro B Natriuretic peptide (BNP): 15 pg/mL (ref 0.0–100.0)

## 2023-07-16 LAB — TSH: TSH: 2.28 u[IU]/mL (ref 0.35–5.50)

## 2023-07-16 MED ORDER — METHOCARBAMOL 500 MG PO TABS: 500.0000 mg | ORAL_TABLET | Freq: Three times a day (TID) | ORAL | 3 refills | Status: AC | PRN

## 2023-07-16 MED ORDER — AMLODIPINE BESYLATE 10 MG PO TABS: 10.0000 mg | ORAL_TABLET | Freq: Every day | ORAL | 1 refills | Status: DC

## 2023-07-16 NOTE — Progress Notes (Unsigned)
 Established Patient Office Visit  Subjective   Patient ID: Marissa Roberts, female    DOB: 1946-01-17  Age: 78 y.o. MRN: [id number]  No chief complaint on file.   HPI Patient presents today for medication management. She is a current patient of Dr Nicolette Barrio.  Reports compliance with medication regimen. Requesting refills of amlodipine and robaxin today. Reports left lower leg swelling intermittently, currently. Reports pain with increased swelling, had L knee replacement x 3 yrs ago. Usually wears compression socks to help with swelling.  Reports lower L leg cramping intermittently.  Inquiring about correct dose of lasix. Has been taking 40mg  once daily and 10 mEq of potassium. Denies other concerns. Medical history as outlined below.  ROS Per HPI    Objective:     BP 132/70 (BP Location: Left Arm, Patient Position: Sitting)   Pulse 83   Temp (!) 96 F (35.6 C) (Temporal)   Ht 5\' 5"  (1.651 m)   Wt 197 lb 3.2 oz (89.4 kg)   SpO2 96%   BMI 32.82 kg/m   Physical Exam Vitals and nursing note reviewed.  Constitutional:      General: She is not in acute distress.    Appearance: Normal appearance. She is obese.  HENT:     Head: Normocephalic and atraumatic.     Right Ear: External ear normal.     Left Ear: External ear normal.     Nose: Nose normal.     Mouth/Throat:     Mouth: Mucous membranes are moist.     Pharynx: Oropharynx is clear.  Eyes:     Extraocular Movements: Extraocular movements intact.     Pupils: Pupils are equal, round, and reactive to light.  Neck:     Vascular: No carotid bruit.  Cardiovascular:     Rate and Rhythm: Normal rate and regular rhythm.     Pulses: Normal pulses.     Heart sounds: Normal heart sounds.  Pulmonary:     Effort: Pulmonary effort is normal. No respiratory distress.     Breath sounds: Normal breath sounds. No wheezing, rhonchi or rales.  Musculoskeletal:        General: No tenderness, deformity or signs of injury.      Cervical back: Normal range of motion.     Right lower leg: No edema.     Left lower leg: Edema (1+ nonpitting) present.  Lymphadenopathy:     Cervical: No cervical adenopathy.  Neurological:     General: No focal deficit present.     Mental Status: She is alert and oriented to person, place, and time.  Psychiatric:        Mood and Affect: Mood normal.        Thought Content: Thought content normal.      Results for orders placed or performed in visit on 07/16/23  CBC with Differential/Platelet  Result Value Ref Range   WBC 10.0 4.0 - 10.5 K/uL   RBC 4.35 3.87 - 5.11 Mil/uL   Hemoglobin 12.4 12.0 - 15.0 g/dL   HCT 19.1 47.8 - 29.5 %   MCV 87.8 78.0 - 100.0 fl   MCHC 32.6 30.0 - 36.0 g/dL   RDW 62.1 (H) 30.8 - 65.7 %   Platelets 267.0 150.0 - 400.0 K/uL   Neutrophils Relative % 52.7 43.0 - 77.0 %   Lymphocytes Relative 35.6 12.0 - 46.0 %   Monocytes Relative 9.1 3.0 - 12.0 %   Eosinophils Relative 1.3 0.0 - 5.0 %  Basophils Relative 1.3 0.0 - 3.0 %   Neutro Abs 5.3 1.4 - 7.7 K/uL   Lymphs Abs 3.6 0.7 - 4.0 K/uL   Monocytes Absolute 0.9 0.1 - 1.0 K/uL   Eosinophils Absolute 0.1 0.0 - 0.7 K/uL   Basophils Absolute 0.1 0.0 - 0.1 K/uL  Comprehensive metabolic panel with GFR  Result Value Ref Range   Sodium 140 135 - 145 mEq/L   Potassium 4.0 3.5 - 5.1 mEq/L   Chloride 102 96 - 112 mEq/L   CO2 30 19 - 32 mEq/L   Glucose, Bld 90 70 - 99 mg/dL   BUN 13 6 - 23 mg/dL   Creatinine, Ser 9.56 0.40 - 1.20 mg/dL   Total Bilirubin 0.3 0.2 - 1.2 mg/dL   Alkaline Phosphatase 72 39 - 117 U/L   AST 19 0 - 37 U/L   ALT 19 0 - 35 U/L   Total Protein 7.2 6.0 - 8.3 g/dL   Albumin 4.3 3.5 - 5.2 g/dL   GFR 21.30 >86.57 mL/min   Calcium 9.5 8.4 - 10.5 mg/dL  Hemoglobin Q4O  Result Value Ref Range   Hgb A1c MFr Bld 6.0 4.6 - 6.5 %  Lipid panel  Result Value Ref Range   Cholesterol 195 0 - 200 mg/dL   Triglycerides 962.9 0.0 - 149.0 mg/dL   HDL 52.84 >13.24 mg/dL   VLDL 40.1 0.0  - 02.7 mg/dL   LDL Cholesterol 94 0 - 99 mg/dL   Total CHOL/HDL Ratio 2    NonHDL 114.24   TSH  Result Value Ref Range   TSH 2.28 0.35 - 5.50 uIU/mL  Brain natriuretic peptide  Result Value Ref Range   Pro B Natriuretic peptide (BNP) 15.0 0.0 - 100.0 pg/mL  Magnesium  Result Value Ref Range   Magnesium 2.2 1.5 - 2.5 mg/dL  T4, free  Result Value Ref Range   Free T4 3.40 (H) 0.60 - 1.60 ng/dL     The 25-DGUY ASCVD risk score (Arnett DK, et al., 2019) is: 37.9%    Assessment & Plan:   Essential hypertension -     amLODIPine Besylate; Take 1 tablet (10 mg total) by mouth daily.  Dispense: 90 tablet; Refill: 1 -     CBC with Differential/Platelet -     Comprehensive metabolic panel with GFR  Hypothyroidism, unspecified type -     TSH -     T4, free  Hyperlipidemia associated with type 2 diabetes mellitus (HCC) -     Lipid panel  Diabetes mellitus type 2 with complications (HCC) -     Comprehensive metabolic panel with GFR -     Hemoglobin A1c -     Lipid panel  Rheumatoid arthritis with rheumatoid factor of multiple sites without organ or systems involvement (HCC) -     CBC with Differential/Platelet -     Comprehensive metabolic panel with GFR  Leg swelling -     Brain natriuretic peptide -     Magnesium  Muscle spasm -     Methocarbamol; Take 1 tablet (500 mg total) by mouth every 8 (eight) hours as needed for muscle spasms.  Dispense: 30 tablet; Refill: 3     Return in about 3 months (around 10/15/2023) for with PCP.    Sherald Barge, FNP

## 2023-07-16 NOTE — Patient Instructions (Addendum)
 Refilled amlodipine.  Refilled methocarbamol.  We are checking labs today, will be in contact with any results that require further attention  Follow up with specialists as scheduled  Follow up with PCP in about 3 mos

## 2023-07-16 NOTE — Progress Notes (Signed)
 Subjective:  Patient ID: Marissa Roberts, female    DOB: March 28, 1946,  MRN: [id number]  Marissa Roberts presents to clinic today for preventative diabetic foot care and painful porokeratotic lesion(s) right foot and painful mycotic toenails that limit ambulation. Painful toenails interfere with ambulation. Aggravating factors include wearing enclosed shoe gear. Pain is relieved with periodic professional debridement. Painful porokeratotic lesions are aggravated when weightbearing with and without shoegear. Pain is relieved with periodic professional debridement.  Chief Complaint  Patient presents with   Diabetes    "Trim my toenails."  Marissa Clear, FNP - 07/16/2023; A1c - 5.7, today's is pending   New problem(s): None.   PCP is Adelia Homestead, MD.  Allergies  Allergen Reactions   Contrast Media [Iodinated Contrast Media]     Throat swells from ct contra   Adhesive [Tape]     No paper tape, causes blisters   Erythromycin Rash   Penicillins Rash    Did it involve swelling of the face/tongue/throat, SOB, or low BP? No Did it involve sudden or severe rash/hives, skin peeling, or any reaction on the inside of your mouth or nose? No Did you need to seek medical attention at a hospital or doctor's office? No When did it last happen?    Many years If all above answers are "NO", may proceed with cephalosporin use.     Review of Systems: Negative except as noted in the HPI.  Objective: No changes noted in today's physical examination. There were no vitals filed for this visit. Marissa Roberts is a pleasant 77 y.o. female in NAD. AAO x 3.  Vascular Examination: Vascular status intact b/l with palpable pedal pulses. CFT immediate b/l. No edema. No pain with calf compression b/l. Skin temperature gradient WNL b/l. No cyanosis or clubbing noted b/l LE.  Neurological Examination: Pt has subjective symptoms of neuropathy. Sensation grossly intact b/l with 10 gram  monofilament.  Dermatological Examination: Pedal skin with normal turgor, texture and tone b/l. Toenails 1-5 b/l thick, discolored, elongated with subungual debris and pain on dorsal palpation.  Porokeratotic lesion(s) R 4th toe. No erythema, no edema, no drainage, no fluctuance.  Musculoskeletal Examination: Muscle strength 5/5 to b/l LE. No pain, crepitus or joint limitation noted with ROM bilateral LE. Hammertoe(s) noted to the of both feet.  Radiographs: None  Assessment/Plan: 1. Pain due to onychomycosis of toenails of both feet   2. Porokeratosis   3. Type 2 diabetes mellitus without complication, with long-term current use of insulin  (HCC)    -Consent given for treatment as described below: -Examined patient. -Continue foot and shoe inspections daily. Monitor blood glucose per PCP/Endocrinologist's recommendations. -Toenails 1-5 bilaterally were debrided in length and girth with sterile nail nippers and dremel. Pinpoint bleeding of L 3rd toe addressed with Lumicain Hemostatic Solution, cleansed with alcohol. Alcohol applied. No further treatment required by patient/caregiver. -Porokeratotic lesion(s) R 4th toe pared and enucleated with sterile currette without incident. Total number of lesions debrided=1. -Patient/POA to call should there be question/concern in the interim.   Return in about 9 weeks (around 09/17/2023).  Marissa Roberts, DPM      [place] LOCATION: [address] [place] [place], [place] [address]  Office [phone]   [facility] LOCATION: [address] [place], [place] [address] Office [phone]

## 2023-07-17 ENCOUNTER — Encounter: Payer: Self-pay | Admitting: Family Medicine

## 2023-07-19 ENCOUNTER — Emergency Department (HOSPITAL_BASED_OUTPATIENT_CLINIC_OR_DEPARTMENT_OTHER)

## 2023-07-19 ENCOUNTER — Other Ambulatory Visit: Payer: Self-pay

## 2023-07-19 ENCOUNTER — Encounter (HOSPITAL_BASED_OUTPATIENT_CLINIC_OR_DEPARTMENT_OTHER): Payer: Self-pay | Admitting: Emergency Medicine

## 2023-07-19 ENCOUNTER — Emergency Department (HOSPITAL_BASED_OUTPATIENT_CLINIC_OR_DEPARTMENT_OTHER)
Admission: EM | Admit: 2023-07-19 | Discharge: 2023-07-20 | Disposition: A | Discharge status: Home / Self Care | Attending: Emergency Medicine | Admitting: Emergency Medicine

## 2023-07-19 DIAGNOSIS — M79631 Pain in right forearm: Secondary | ICD-10-CM | POA: Diagnosis not present

## 2023-07-19 DIAGNOSIS — W1839XA Other fall on same level, initial encounter: Secondary | ICD-10-CM | POA: Diagnosis not present

## 2023-07-19 DIAGNOSIS — S6991XA Unspecified injury of right wrist, hand and finger(s), initial encounter: Secondary | ICD-10-CM | POA: Diagnosis not present

## 2023-07-19 DIAGNOSIS — S52571A Other intraarticular fracture of lower end of right radius, initial encounter for closed fracture: Secondary | ICD-10-CM | POA: Diagnosis not present

## 2023-07-19 DIAGNOSIS — Z794 Long term (current) use of insulin: Secondary | ICD-10-CM | POA: Diagnosis not present

## 2023-07-19 DIAGNOSIS — S52501A Unspecified fracture of the lower end of right radius, initial encounter for closed fracture: Secondary | ICD-10-CM

## 2023-07-19 DIAGNOSIS — S52591A Other fractures of lower end of right radius, initial encounter for closed fracture: Secondary | ICD-10-CM | POA: Diagnosis not present

## 2023-07-19 DIAGNOSIS — M85831 Other specified disorders of bone density and structure, right forearm: Secondary | ICD-10-CM | POA: Diagnosis not present

## 2023-07-19 MED ORDER — HYDROCODONE-ACETAMINOPHEN 5-325 MG PO TABS
1.0000 | ORAL_TABLET | Freq: Once | ORAL | Status: AC
  Administered 2023-07-19: 1 via ORAL
  Filled 2023-07-19: qty 1

## 2023-07-19 MED ORDER — HYDROCODONE-ACETAMINOPHEN 5-325 MG PO TABS: 1.0000 | ORAL_TABLET | Freq: Four times a day (QID) | ORAL | 0 refills | Status: AC | PRN

## 2023-07-19 NOTE — ED Provider Notes (Signed)
 [facility] AT [facility]  Provider Note  CSN: [id number] Arrival date & time: 07/19/23 2147  History Chief Complaint  Patient presents with   Marissa Roberts is a 78 y.o. female here with daughter after a mechanical fall a few hours prior to arrival in which she stumbled on some rocks and fell onto her outstretched R hand, injuring her R wrist. Denies head injury or LOC. No other injuries reported.    Home Medications Prior to Admission medications   Medication Sig Start Date End Date Taking? Authorizing Provider  HYDROcodone -acetaminophen  (NORCO/VICODIN) 5-325 MG tablet Take 1 tablet by mouth every 6 (six) hours as needed for severe pain (pain score 7-10). 07/19/23  Yes Charmayne Cooper, MD  acetaminophen  (TYLENOL ) 325 MG tablet Take 2 tablets (650 mg total) by mouth every 6 (six) hours as needed for moderate pain. 06/24/22   Adolph Hoop, PA-C  amLODipine  (NORVASC ) 10 MG tablet Take 1 tablet (10 mg total) by mouth daily. 07/16/23   Wellington Half, FNP  Biotin 08657 MCG TABS Take 10,000 mcg by mouth daily.    [provider]  Calcium  Carb-Cholecalciferol  (CALCIUM  600 + D PO) Take 1 tablet by mouth daily.    [provider]  Continuous Blood Gluc Receiver (DEXCOM G6 RECEIVER) DEVI For sensor monitoring 11/17/20   Lajean Pike, MD  Continuous Glucose Sensor (DEXCOM G6 SENSOR) MISC Use to monitor blood sugar, change after 10 days Patient not taking: Reported on 05/20/2023 05/16/23   Thapa, [place], MD  Continuous Glucose Sensor (DEXCOM G7 SENSOR) MISC 1 Device by Does not apply route continuous. Change every 10 days 05/16/23   Thapa, [place], MD  Continuous Glucose Transmitter (DEXCOM G6 TRANSMITTER) MISC 4 PER YEAR 05/16/23   Thapa, [place], MD  diclofenac  Sodium (VOLTAREN ) 1 % GEL Apply topically as needed.    [provider]  FARXIGA  5 MG TABS tablet Take 1 tablet (5 mg total) by mouth daily. 05/16/23   Thapa, [place], MD   fluticasone  (FLONASE ) 50 MCG/ACT nasal spray Place 2 sprays into both nostrils 2 (two) times daily as needed for allergies or rhinitis.    [provider]  furosemide  (LASIX ) 40 MG tablet Take 2 tablets by mouth once daily 12/30/22   Adelia Homestead, MD  Glucagon  (GVOKE HYPOPEN  1-PACK) 1 MG/0.2ML SOAJ Inject 1 mg into the skin as needed (low blood sugar with impaired consciousness). 02/11/23   Thapa, [place], MD  Glucosamine HCl-MSM (GLUCOSAMINE-MSM PO) Take 1 tablet by mouth daily.    [provider]  glucose blood (ONETOUCH VERIO) test strip USE 1 STRIP TO CHECK GLUCOSE THREE TIMES DAILY 2 HOURS AFTER A MEAL AND 1 THREE TIMES A WEEK UPON WAKING UP 09/07/22   Lajean Pike, MD  Insulin  Pen Needle (BD PEN NEEDLE NANO U/F) 32G X 4 MM MISC USE 1  4 TIMES DAILY TO  INJECT  INSULIN  05/16/23   Thapa, [place], MD  latanoprost (XALATAN) 0.005 % ophthalmic solution SMARTSIG:1 Drop(s) In Eye(s) Every Evening 06/27/20   [provider]  leflunomide  (ARAVA ) 20 MG tablet Take 1 tablet by mouth once daily 05/01/23   Nicholas Bari, MD  levothyroxine  (SYNTHROID ) 112 MCG tablet TAKE 1 TABLET BY MOUTH BEFORE BREAKFAST 05/16/23   Thapa, [place], MD  LINZESS  290 MCG CAPS capsule TAKE 1 CAPSULE BY MOUTH ONCE DAILY AS NEEDED 07/02/21   Adelia Homestead, MD  Magnesium 100 MG CAPS Take 100 mg by mouth daily.  [provider]  metFORMIN  (GLUCOPHAGE ) 1000 MG tablet Take 1 tablet (1,000 mg total) by mouth 2 (two) times daily with a meal. 05/16/23   Thapa, [place], MD  methocarbamol  (ROBAXIN ) 500 MG tablet Take 1 tablet (500 mg total) by mouth every 8 (eight) hours as needed for muscle spasms. 07/16/23   Wellington Half, FNP  montelukast  (SINGULAIR ) 10 MG tablet TAKE 1 TABLET BY MOUTH AT BEDTIME 05/22/23   Adelia Homestead, MD  NOVOLOG  FLEXPEN 100 UNIT/ML FlexPen INJECT as per sliding scale as instructed maximum 20 units/ day SUBCUTANEOUSLY THREE TIMES DAILY BEFORE MEAL(S) 05/16/23    Thapa, [place], MD  nystatin -triamcinolone  ointment (MYCOLOG) Apply to feet twice daily for 4 weeks. 03/12/23   Luella Sager, DPM  omeprazole  (PRILOSEC) 40 MG capsule Take 40 mg by mouth daily. 09/28/20   [provider]  ondansetron  (ZOFRAN ) 4 MG tablet Take 1 tablet (4 mg total) by mouth every 8 (eight) hours as needed for nausea or vomiting. 04/08/22   Mort Ards, MD  OneTouch Delica Lancets 33G MISC USE TO CHECK BLOOD SUGAR UP TO THREE TIMES A DAY, 2 HOURS AFTER A MEAL AND UP TO 3 TIMES A WEEK UPON WAKING UP 10/09/22   Lajean Pike, MD  potassium chloride  (KLOR-CON ) 10 MEQ tablet Take 1 tablet by mouth once daily 06/24/23   Thapa, [place], MD  rosuvastatin  (CRESTOR ) 40 MG tablet Take 1 tablet (40 mg total) by mouth daily. 07/15/23   Adelia Homestead, MD  traMADol  (ULTRAM ) 50 MG tablet Take 50 mg by mouth every 6 (six) hours as needed.    [provider]  TRESIBA  FLEXTOUCH 200 UNIT/ML FlexTouch Pen INJECT 24 UNITS SUBCUTANEOUSLY IN THE MORNING 05/16/23   Thapa, [place], MD  valsartan  (DIOVAN ) 320 MG tablet Take 1 tablet (320 mg total) by mouth daily. 05/16/23   Thapa, [place], MD  Saxagliptin -Metformin  07-998 MG TB24 Take 1 tablet by mouth daily. 08/14/12 04/19/13  Janece Means, MD     Allergies    Contrast media [iodinated contrast media], Adhesive [tape], Erythromycin, and Penicillins   Review of Systems   Review of Systems Please see HPI for pertinent positives and negatives  Physical Exam BP (!) 175/78   Pulse 85   Temp 97.9 F (36.6 C) (Oral)   Resp 18   Ht 5\' 5"  (1.651 m)   Wt 89 kg   SpO2 96%   BMI 32.65 kg/m   Physical Exam Vitals and nursing note reviewed.  HENT:     Head: Normocephalic.     Nose: Nose normal.  Eyes:     Extraocular Movements: Extraocular movements intact.  Cardiovascular:     Pulses: Normal pulses.  Pulmonary:     Effort: Pulmonary effort is normal.  Musculoskeletal:        General: Swelling and tenderness (R wrist,  radial>unlar) present. No deformity. Normal range of motion.     Cervical back: Neck supple.  Skin:    Findings: No rash (on exposed skin).  Neurological:     Mental Status: She is alert and oriented to person, place, and time.  Psychiatric:        Mood and Affect: Mood normal.     ED Results / Procedures / Treatments   EKG None  Procedures Procedures  Medications Ordered in the ED Medications  HYDROcodone -acetaminophen  (NORCO/VICODIN) 5-325 MG per tablet 1 tablet (1 tablet Oral Given 07/19/23 2337)    Initial Impression and Plan  Patient here with FOOSH injury  after mechanical fall. Will check xrays. Norco for pain.   ED Course   Clinical Course as of 07/19/23 2352  Sat Jul 19, 2023  2326 I personally viewed the images from radiology studies and agree with radiologist interpretation: Xray shows a distal radius fracture. Splint for comfort and Ortho follow up. Patient and family amenable to plan. Rx for [employer].   [CS]    Clinical Course User Index [CS] Charmayne Cooper, MD     MDM Rules/Calculators/A&P Medical Decision Making Problems Addressed: Closed fracture of distal end of right radius, unspecified fracture morphology, initial encounter: acute illness or injury  Amount and/or Complexity of Data Reviewed Radiology: ordered and independent interpretation performed. Decision-making details documented in ED Course.  Risk Prescription drug management.     Final Clinical Impression(s) / ED Diagnoses Final diagnoses:  Closed fracture of distal end of right radius, unspecified fracture morphology, initial encounter    Rx / DC Orders ED Discharge Orders          Ordered    HYDROcodone -acetaminophen  (NORCO/VICODIN) 5-325 MG tablet  Every 6 hours PRN        07/19/23 2329             Charmayne Cooper, MD 07/19/23 2352

## 2023-07-19 NOTE — ED Triage Notes (Signed)
 Patient here POV after having a mechanical fall at 8 pm tonight. She states she stepped on a rock, and complains of soreness to the R side of her forearm and wrist. Denies hitting her head. No blood thinners. No LOC. Denies pain elsewhere.

## 2023-07-23 ENCOUNTER — Encounter: Payer: Self-pay | Admitting: Family Medicine

## 2023-07-23 DIAGNOSIS — M5416 Radiculopathy, lumbar region: Secondary | ICD-10-CM | POA: Diagnosis not present

## 2023-07-23 DIAGNOSIS — M25531 Pain in right wrist: Secondary | ICD-10-CM | POA: Insufficient documentation

## 2023-07-23 DIAGNOSIS — S52501A Unspecified fracture of the lower end of right radius, initial encounter for closed fracture: Secondary | ICD-10-CM | POA: Diagnosis not present

## 2023-07-24 DIAGNOSIS — H401131 Primary open-angle glaucoma, bilateral, mild stage: Secondary | ICD-10-CM | POA: Diagnosis not present

## 2023-07-24 DIAGNOSIS — S52501A Unspecified fracture of the lower end of right radius, initial encounter for closed fracture: Secondary | ICD-10-CM | POA: Insufficient documentation

## 2023-07-25 ENCOUNTER — Other Ambulatory Visit: Payer: Self-pay | Admitting: Internal Medicine

## 2023-07-25 ENCOUNTER — Other Ambulatory Visit: Payer: Self-pay | Admitting: Rheumatology

## 2023-07-28 NOTE — Telephone Encounter (Signed)
 Last Fill: 05/01/2023  Labs: 07/16/2023 RDW 17.3  Next Visit: 10/21/2023  Last Visit: 05/20/2023  DX: Rheumatoid arthritis involving multiple sites with positive rheumatoid factor   Current Dose per office note 05/20/2023: Arava  20 mg 1 tablet by mouth daily.   Okay to refill Arava  ?

## 2023-07-30 DIAGNOSIS — M5416 Radiculopathy, lumbar region: Secondary | ICD-10-CM | POA: Diagnosis not present

## 2023-07-30 DIAGNOSIS — M47819 Spondylosis without myelopathy or radiculopathy, site unspecified: Secondary | ICD-10-CM | POA: Diagnosis not present

## 2023-07-30 DIAGNOSIS — M48062 Spinal stenosis, lumbar region with neurogenic claudication: Secondary | ICD-10-CM | POA: Diagnosis not present

## 2023-08-04 DIAGNOSIS — K581 Irritable bowel syndrome with constipation: Secondary | ICD-10-CM | POA: Diagnosis not present

## 2023-08-04 DIAGNOSIS — K2270 Barrett's esophagus without dysplasia: Secondary | ICD-10-CM | POA: Diagnosis not present

## 2023-08-04 DIAGNOSIS — K219 Gastro-esophageal reflux disease without esophagitis: Secondary | ICD-10-CM | POA: Diagnosis not present

## 2023-08-05 ENCOUNTER — Telehealth: Payer: Self-pay | Admitting: Internal Medicine

## 2023-08-05 NOTE — Telephone Encounter (Signed)
 Copied from CRM [id number]. Topic: Clinical - Lab/Test Results >> Aug 04, 2023  4:25 PM Alyse July wrote: Reason for CRM: Verify previous labs results given. >> Aug 05, 2023  9:04 AM Clydene Darner H wrote: Patient called this morning regarding lab results drawn on 07/16/2023. She stated she has not received any follow-up communication. During a recent appointment with her GI provider, she was asked about the lab work. The GI provider contacted the PCP office for the results and received some values that appeared to be low but was unsure if they were accurate. The GI provider advised the patient to contact her PCP directly for confirmation of the correct lab values.Patient mentioned she attempted to call yesterday for these results but was disconnected. A CRM was created yesterday regarding this issue. She added that she will be taking her husband to an appointment at 10:00 AM today, and if a return call is made during that time, she requests a clear and detailed voicemail with the results, including the name and call-back number.

## 2023-08-05 NOTE — Telephone Encounter (Signed)
 Spoke with patient regarding her lab results, gave a verbal understanding. She states that she is still having muscle cramps at night. Do you recommend anything else? She has an upcoming appt with Dr.Crawford on 05/12.

## 2023-08-06 DIAGNOSIS — S52501A Unspecified fracture of the lower end of right radius, initial encounter for closed fracture: Secondary | ICD-10-CM | POA: Diagnosis not present

## 2023-08-07 DIAGNOSIS — M5416 Radiculopathy, lumbar region: Secondary | ICD-10-CM | POA: Diagnosis not present

## 2023-08-08 ENCOUNTER — Other Ambulatory Visit: Payer: Medicare Other

## 2023-08-08 DIAGNOSIS — E063 Autoimmune thyroiditis: Secondary | ICD-10-CM | POA: Diagnosis not present

## 2023-08-08 DIAGNOSIS — E118 Type 2 diabetes mellitus with unspecified complications: Secondary | ICD-10-CM | POA: Diagnosis not present

## 2023-08-09 LAB — LIPID PANEL
Cholesterol: 188 mg/dL (ref <200)
HDL: 75 mg/dL (ref >=50)
LDL Cholesterol (Calc): 89 mg/dL
Non-HDL Cholesterol (Calc): 113 mg/dL (ref <130)
Total CHOL/HDL Ratio: 2.5 (calc) (ref <5.0)
Triglycerides: 137 mg/dL (ref <150)

## 2023-08-09 LAB — HEMOGLOBIN A1C
Hgb A1c MFr Bld: 6.2 % — ABNORMAL HIGH (ref <5.7)
Mean Plasma Glucose: 131 mg/dL
eAG (mmol/L): 7.3 mmol/L

## 2023-08-09 LAB — BASIC METABOLIC PANEL WITH GFR
BUN: 23 mg/dL (ref 7–25)
CO2: 24 mmol/L (ref 20–32)
Calcium: 9.8 mg/dL (ref 8.6–10.4)
Chloride: 104 mmol/L (ref 98–110)
Creat: 0.8 mg/dL (ref 0.60–1.00)
Glucose, Bld: 177 mg/dL — ABNORMAL HIGH (ref 65–99)
Potassium: 4 mmol/L (ref 3.5–5.3)
Sodium: 139 mmol/L (ref 135–146)
eGFR: 77 mL/min/{1.73_m2} (ref >=60)

## 2023-08-09 LAB — MICROALBUMIN / CREATININE URINE RATIO
Creatinine, Urine: 48 mg/dL (ref 20–275)
Microalb Creat Ratio: 340 mg/g{creat} — ABNORMAL HIGH (ref <30)
Microalb, Ur: 16.3 mg/dL

## 2023-08-09 LAB — T4, FREE: Free T4: 1.4 ng/dL (ref 0.8–1.8)

## 2023-08-09 LAB — TSH: TSH: 0.31 m[IU]/L — ABNORMAL LOW (ref 0.40–4.50)

## 2023-08-11 ENCOUNTER — Ambulatory Visit: Admitting: Internal Medicine

## 2023-08-11 ENCOUNTER — Encounter: Payer: Self-pay | Admitting: Endocrinology

## 2023-08-13 ENCOUNTER — Encounter: Payer: Self-pay | Admitting: Endocrinology

## 2023-08-13 ENCOUNTER — Ambulatory Visit (INDEPENDENT_AMBULATORY_CARE_PROVIDER_SITE_OTHER): Payer: Medicare Other | Admitting: Endocrinology

## 2023-08-13 VITALS — BP 132/70 | HR 86 | Resp 20 | Ht 65.0 in | Wt 201.0 lb

## 2023-08-13 DIAGNOSIS — E118 Type 2 diabetes mellitus with unspecified complications: Secondary | ICD-10-CM | POA: Diagnosis not present

## 2023-08-13 DIAGNOSIS — Z7984 Long term (current) use of oral hypoglycemic drugs: Secondary | ICD-10-CM

## 2023-08-13 DIAGNOSIS — M810 Age-related osteoporosis without current pathological fracture: Secondary | ICD-10-CM

## 2023-08-13 DIAGNOSIS — M8000XA Age-related osteoporosis with current pathological fracture, unspecified site, initial encounter for fracture: Secondary | ICD-10-CM

## 2023-08-13 MED ORDER — TRESIBA FLEXTOUCH 200 UNIT/ML ~~LOC~~ SOPN: PEN_INJECTOR | SUBCUTANEOUS | 4 refills | Status: AC

## 2023-08-13 MED ORDER — DAPAGLIFLOZIN PROPANEDIOL 10 MG PO TABS: 10.0000 mg | ORAL_TABLET | Freq: Every day | ORAL | 3 refills | Status: AC

## 2023-08-13 NOTE — Progress Notes (Addendum)
 Outpatient Endocrinology Note [place] Uva Runkel, MD  08/13/23  Patient's Name: Marissa Roberts    DOB: 15-May-1945    MRN: [id number]                                                    REASON OF VISIT: Follow up of type 2 diabetes mellitus / Hypothyroidism  PCP: Adelia Homestead, MD  HISTORY OF PRESENT ILLNESS:   Marissa Roberts is a 78 y.o. old female with past medical history listed below, is here for follow up for type 2 diabetes mellitus.   Pertinent Diabetes History: Patient was diagnosed with type 2 diabetes mellitus in 2005.  Patient has controlled type 2 diabetes mellitus.  Chronic Diabetes Complications : Retinopathy: no. Last ophthalmology exam was done on annuallyy, following with ophthalmology regularly.  Nephropathy: yes, microalbuminuria present., on ACE/ARB /valsartan . Peripheral neuropathy: no Coronary artery disease: no Stroke: on  Relevant comorbidities and cardiovascular risk factors: Obesity: yes Body mass index is 33.45 kg/m.  Hypertension: Yes  Hyperlipidemia : Yes, on statin   Current / Home Diabetic regimen includes: Tresiba  24 units in the morning.  Novolog  occasionally 1 - 5 units per sliding scale.  Metformin  1000 mg 2 times a day. Farxiga  5 mg daily.  Prior diabetic medications: Kombiglyze, Byetta .  V-Go pump in the past, A stopped due to cost, skin irritation and discomfort.  She had taken Lantus  in the past.  Glycemic data:    CONTINUOUS GLUCOSE MONITORING SYSTEM (CGMS) INTERPRETATION: At today's visit, we reviewed CGM downloads. The full report is scanned in the media. Reviewing the CGM trends, blood glucose are as follows:  Dexcom  CGM-  Sensor Download (Sensor download was reviewed and summarized below.) Dates: May 1 to May 14 , 2025, 14 days  Glucose Management Indicator: 6.5%    Interpretation: Mostly acceptable blood sugar.  Mostly low normal blood sugar overnight and early morning fasting, occasionally noted hypoglycemia in  the early morning likely related to sensor issue.  Occasionally low normal blood sugar in between the meals.  Rare mild hyperglycemia with blood sugar at 200 range postprandially.  Hypoglycemia: Patient has minor / rare hypoglycemic episodes. Patient has hypoglycemia awareness.  Factors modifying glucose control: 1.  Diabetic diet assessment: 3 meals a day.  2.  Staying active or exercising: No formal exercise.  3.  Medication compliance: compliant all of the time.  # Primary hypothyroidism -She has been hypothyroid for 40+ years.  She has been taking levothyroxine  since her diagnosis.  She is currently taking levothyroxine  112 mcg daily.  Interval history  CGM data as reviewed above.  Mostly acceptable blood sugar.  Hemoglobin A1c 6.2%.  No other complaints today.  Recent laboratory results reviewed, elevated urine microalbumin creatinine ratio.  Patient has been taking levothyroxine  112 mcg daily, denies palpitation or heat intolerance.  Recent thyroid  function test mildly low TSH and normal free T4 on May 9 however she had normal TSH of 2.218 July 16, 2023 however had elevated free T4 at that time.   Latest Reference Range & Units 07/16/23 14:28 08/08/23 09:46  TSH 0.40 - 4.50 mIU/L 2.28 0.31 (L)  T4,Free(Direct) 0.8 - 1.8 ng/dL 2.13 (H) 1.4  (L): Data is abnormally low (H): Data is abnormally high  Patient had right wrist fracture after a mechanical fall from standing height,  into the rock in mid April.  The fracture was concerning for fragility fracture.  Patient has been taking calcium /vitamin D  1 tablet daily and multivitamin 1 tablet daily.  She used to have normal vitamin D  level in the past.  Patient reports he also had compression fracture of the vertebra in the past.  She had normal parathyroid  hormone and calcium  levels in the past.  DEXA scan last done in January 2023 reports reviewed consistent normal bone mineral density.  Patient has rheumatoid arthritis, follow-up  with rheumatology.  Has osteoarthritis and degenerative changes on the spine.  Patient had steroid injections in the past.  Patient had a history of L1 compression fracture after a fall in January 2024 and underwent kyphoplasty in July 2024.  Problem list in 2010 mentioned about having osteoporosis.  Patient does not recall she was diagnosed with osteoporosis.  Patient denies taking any antiresorptive therapy for the bone health or osteoporosis.  Last time her DEXA scan was done by OB/GYN in January 2023, at [facility].  REVIEW OF SYSTEMS As per history of present illness.   PAST MEDICAL HISTORY: Past Medical History:  Diagnosis Date   Anemia    Arthritis    Bronchitis    hx of   Diabetes mellitus    type 2   Family history of adverse reaction to anesthesia    per patient, "daughter threw up after a knee surgery"   GERD (gastroesophageal reflux disease)    Hypercholesteremia    Hypertension    Hypothyroidism    Pneumonia    hx of   PONV (postoperative nausea and vomiting)    Thyroid  disease     PAST SURGICAL HISTORY: Past Surgical History:  Procedure Laterality Date   ABDOMINAL HYSTERECTOMY     partial   BREAST SURGERY     lumpectomy left breast   carpel tunnel     COLONOSCOPY  07/2020   COLONOSCOPY W/ POLYPECTOMY     FOOT SURGERY     knee arhtroscopy     KNEE ARTHROPLASTY Left 05/2020   KYPHOPLASTY N/A 04/08/2022   Procedure: KYPHOPLASTY L1;  Surgeon: Mort Ards, MD;  Location: [facility] OR;  Service: Orthopedics;  Laterality: N/A;  [facility] with [facility]   ORIF PATELLA Left 05/23/2020   Procedure: LEFT KNEE POLE PATELLA EXCISION;  Surgeon: Jasmine Mesi, MD;  Location: [facility] OR;  Service: Orthopedics;  Laterality: Left;   SHOULDER ARTHROSCOPY WITH SUBACROMIAL DECOMPRESSION, ROTATOR CUFF REPAIR AND BICEP TENDON REPAIR Right 05/26/2012   Procedure: Right Shoulder Diagnostic Operative Arthroscopy, Subacromial Decompression, Biceps Tenodesis, Mini  Open Rotator Cuff Repair;  Surgeon: Jasmine Mesi, MD;  Location: [facility] OR;  Service: Orthopedics;  Laterality: Right;  Right Shoulder Diagnostic Operative Arthroscopy, Subacromial Decompression, Biceps Tenodesis, Mini Open Rotator Cuff Repair   TONSILLECTOMY     TOTAL KNEE ARTHROPLASTY Left 07/01/2019   Procedure: LEFT TOTAL KNEE ARTHROPLASTY;  Surgeon: Jasmine Mesi, MD;  Location: [facility] OR;  Service: Orthopedics;  Laterality: Left;   UPPER GI ENDOSCOPY  07/2020    ALLERGIES: Allergies  Allergen Reactions   Contrast Media [Iodinated Contrast Media]     Throat swells from ct contra   Adhesive [Tape]     No paper tape, causes blisters   Erythromycin Rash   Penicillins Rash    Did it involve swelling of the face/tongue/throat, SOB, or low BP? No Did it involve sudden or severe rash/hives, skin peeling, or any reaction on the inside of your mouth  or nose? No Did you need to seek medical attention at a hospital or doctor's office? No When did it last happen?    Many years If all above answers are "NO", may proceed with cephalosporin use.     FAMILY HISTORY:  Family History  Problem Relation Age of Onset   Diabetes Mother    Heart disease Mother    Hyperlipidemia Mother    Diabetes Father    Diabetes Sister    Hypertension Sister    Heart disease Sister    Dementia Sister    Heart disease Brother    Healthy Daughter    Healthy Daughter    Healthy Daughter    Healthy Son     SOCIAL HISTORY: Social History   Socioeconomic History   Marital status: Married    Spouse name: Not on file   Number of children: Not on file   Years of education: Not on file   Highest education level: Not on file  Occupational History   Not on file  Tobacco Use   Smoking status: Former    Current packs/day: 0.00    Types: Cigarettes    Quit date: 04/01/1990    Years since quitting: 33.3    Passive exposure: Never   Smokeless tobacco: Never  Vaping Use   Vaping status: Never Used   Substance and Sexual Activity   Alcohol  use: Not Currently    Comment: 06/22/2019: per patient about 20-25 years ago   Drug use: No   Sexual activity: Never    Birth control/protection: Abstinence  Other Topics Concern   Not on file  Social History Narrative   Not on file   Social Drivers of Health   Financial Resource Strain: Low Risk  (01/21/2023)   Overall Financial Resource Strain (CARDIA)    Difficulty of Paying Living Expenses: Not hard at all  Food Insecurity: No Food Insecurity (01/21/2023)   Hunger Vital Sign    Worried About Running Out of Food in the Last Year: Never true    Ran Out of Food in the Last Year: Never true  Transportation Needs: No Transportation Needs (01/21/2023)   PRAPARE - Administrator, Civil Service (Medical): No    Lack of Transportation (Non-Medical): No  Physical Activity: Inactive (01/21/2023)   Exercise Vital Sign    Days of Exercise per Week: 0 days    Minutes of Exercise per Session: 0 min  Stress: No Stress Concern Present (01/21/2023)   [employer] of Occupational Health - Occupational Stress Questionnaire    Feeling of Stress : Not at all  Social Connections: Socially Integrated (01/21/2023)   Social Connection and Isolation Panel [NHANES]    Frequency of Communication with Friends and Family: More than three times a week    Frequency of Social Gatherings with Friends and Family: More than three times a week    Attends Religious Services: More than 4 times per year    Active Member of [employer] or Organizations: Yes    Attends Engineer, structural: More than 4 times per year    Marital Status: Married    MEDICATIONS:  Current Outpatient Medications  Medication Sig Dispense Refill   acetaminophen  (TYLENOL ) 325 MG tablet Take 2 tablets (650 mg total) by mouth every 6 (six) hours as needed for moderate pain. 30 tablet 0   amLODipine  (NORVASC ) 10 MG tablet Take 1 tablet (10 mg total) by mouth daily. 90 tablet 1    Biotin 10000 MCG  TABS Take 10,000 mcg by mouth daily.     Calcium  Carb-Cholecalciferol  (CALCIUM  600 + D PO) Take 1 tablet by mouth daily.     Continuous Blood Gluc Receiver (DEXCOM G6 RECEIVER) DEVI For sensor monitoring 1 each 0   Continuous Glucose Sensor (DEXCOM G6 SENSOR) MISC Use to monitor blood sugar, change after 10 days 9 each 3   Continuous Glucose Sensor (DEXCOM G7 SENSOR) MISC 1 Device by Does not apply route continuous. Change every 10 days 9 each 0   Continuous Glucose Transmitter (DEXCOM G6 TRANSMITTER) MISC 4 PER YEAR 1 each 3   dapagliflozin  propanediol (FARXIGA ) 10 MG TABS tablet Take 1 tablet (10 mg total) by mouth daily before breakfast. 90 tablet 3   diclofenac  Sodium (VOLTAREN ) 1 % GEL Apply topically as needed.     fluticasone  (FLONASE ) 50 MCG/ACT nasal spray Place 2 sprays into both nostrils 2 (two) times daily as needed for allergies or rhinitis.     furosemide  (LASIX ) 40 MG tablet Take 2 tablets by mouth once daily 180 tablet 0   Glucagon  (GVOKE HYPOPEN  1-PACK) 1 MG/0.2ML SOAJ Inject 1 mg into the skin as needed (low blood sugar with impaired consciousness). 0.4 mL 2   Glucosamine HCl-MSM (GLUCOSAMINE-MSM PO) Take 1 tablet by mouth daily.     glucose blood (ONETOUCH VERIO) test strip USE 1 STRIP TO CHECK GLUCOSE THREE TIMES DAILY 2 HOURS AFTER A MEAL AND 1 THREE TIMES A WEEK UPON WAKING UP 250 each 2   HYDROcodone -acetaminophen  (NORCO/VICODIN) 5-325 MG tablet Take 1 tablet by mouth every 6 (six) hours as needed for severe pain (pain score 7-10). 12 tablet 0   Insulin  Pen Needle (BD PEN NEEDLE NANO U/F) 32G X 4 MM MISC USE 1  4 TIMES DAILY TO  INJECT  INSULIN  200 each 2   latanoprost (XALATAN) 0.005 % ophthalmic solution SMARTSIG:1 Drop(s) In Eye(s) Every Evening     leflunomide  (ARAVA ) 20 MG tablet Take 1 tablet by mouth once daily 90 tablet 0   levothyroxine  (SYNTHROID ) 112 MCG tablet TAKE 1 TABLET BY MOUTH BEFORE BREAKFAST 90 tablet 3   LINZESS  290 MCG CAPS capsule  TAKE 1 CAPSULE BY MOUTH ONCE DAILY AS NEEDED 90 capsule 0   Magnesium 100 MG CAPS Take 100 mg by mouth daily.     metFORMIN  (GLUCOPHAGE ) 1000 MG tablet Take 1 tablet (1,000 mg total) by mouth 2 (two) times daily with a meal. 180 tablet 3   methocarbamol  (ROBAXIN ) 500 MG tablet Take 1 tablet (500 mg total) by mouth every 8 (eight) hours as needed for muscle spasms. 30 tablet 3   montelukast  (SINGULAIR ) 10 MG tablet TAKE 1 TABLET BY MOUTH AT BEDTIME 90 tablet 0   NOVOLOG  FLEXPEN 100 UNIT/ML FlexPen INJECT as per sliding scale as instructed maximum 20 units/ day SUBCUTANEOUSLY THREE TIMES DAILY BEFORE MEAL(S) 15 mL 4   nystatin -triamcinolone  ointment (MYCOLOG) Apply to feet twice daily for 4 weeks. 60 g 1   omeprazole  (PRILOSEC) 40 MG capsule Take 40 mg by mouth daily.     ondansetron  (ZOFRAN ) 4 MG tablet Take 1 tablet (4 mg total) by mouth every 8 (eight) hours as needed for nausea or vomiting. 20 tablet 0   OneTouch Delica Lancets 33G MISC USE TO CHECK BLOOD SUGAR UP TO THREE TIMES A DAY, 2 HOURS AFTER A MEAL AND UP TO 3 TIMES A WEEK UPON WAKING UP 300 each 0   potassium chloride  (KLOR-CON ) 10 MEQ tablet Take 1 tablet by mouth  once daily 90 tablet 0   rosuvastatin  (CRESTOR ) 40 MG tablet Take 1 tablet (40 mg total) by mouth daily. 90 tablet 0   [Paused] traMADol  (ULTRAM ) 50 MG tablet Take 50 mg by mouth every 6 (six) hours as needed.     valsartan  (DIOVAN ) 320 MG tablet Take 1 tablet (320 mg total) by mouth daily. 90 tablet 3   TRESIBA  FLEXTOUCH 200 UNIT/ML FlexTouch Pen INJECT 20 UNITS SUBCUTANEOUSLY IN THE MORNING 9 mL 4   No current facility-administered medications for this visit.    PHYSICAL EXAM: Vitals:   08/13/23 0858  BP: 132/70  Pulse: 86  Resp: 20  SpO2: 97%  Weight: 201 lb (91.2 kg)  Height: 5\' 5"  (1.651 m)    Body mass index is 33.45 kg/m.  Wt Readings from Last 3 Encounters:  08/13/23 201 lb (91.2 kg)  07/19/23 196 lb 3.4 oz (89 kg)  07/16/23 197 lb 3.2 oz (89.4 kg)     General: Well developed, well nourished female in no apparent distress.  HEENT: AT/[place], no external lesions.  Eyes: Conjunctiva clear and no icterus. Neck: Neck supple  Lungs: Respirations not labored Neurologic: Alert, oriented, normal speech Extremities / Skin: Dry.  Psychiatric: Does not appear depressed or anxious  Diabetic Foot Exam - Simple   No data filed     LABS Reviewed Lab Results  Component Value Date   HGBA1C 6.2 (H) 08/08/2023   HGBA1C 6.0 07/16/2023   HGBA1C 6.1 (H) 05/12/2023   Lab Results  Component Value Date   FRUCTOSAMINE 194 07/30/2021   Lab Results  Component Value Date   CHOL 188 08/08/2023   HDL 75 08/08/2023   LDLCALC 89 08/08/2023   LDLDIRECT 62.0 05/18/2021   TRIG 137 08/08/2023   CHOLHDL 2.5 08/08/2023   Lab Results  Component Value Date   MICRALBCREAT 340 (H) 08/08/2023   MICRALBCREAT 36.1 (H) 07/05/2022   Lab Results  Component Value Date   CREATININE 0.80 08/08/2023   Lab Results  Component Value Date   GFR 82.90 07/16/2023    ASSESSMENT / PLAN  1. Diabetes mellitus type 2 with complications (HCC)   2. Age-related osteoporosis with current pathological fracture, initial encounter   3. Postmenopausal osteoporosis    Diabetes Mellitus type 2, complicated by microalbuminuria. - Diabetic status / severity: Controlled.  Lab Results  Component Value Date   HGBA1C 6.2 (H) 08/08/2023    - Hemoglobin A1c goal : <6.5%  - Medications: See below.  Diabetes regimen:  Decrease tresiba  from 24 units to 20 units in the morning. Metformin  1000 mg two times a day. Farxiga  5mg  daily, increased to 10 mg daily.  Novolog  sliding scale before meals.  Mild Sliding Scale Blood Glucose        Insulin  60-150                     None 151-200                   1 unit 201-250                   2 units 251-300                   4 units 301-350                   6 units 351-400  8 units      >400                         9 units and call provider  - Home glucose testing: CGM Dexcom G and check as needed.  - Discussed/ Gave Hypoglycemia treatment plan.  Advised to use glucose tablet for correcting hypoglycemia.  She has Glucagon  Emergency Kit at home.  # Consult : not required at this time.   # Annual urine for microalbuminuria/ creatinine ratio, no microalbuminuria currently, continue ACE/ARB /valsartan .  On Farxiga  increase the dose. Last  Lab Results  Component Value Date   MICRALBCREAT 340 (H) 08/08/2023    # Foot check nightly.  # Annual dilated diabetic eye exams.   - Diet: Make healthy diabetic food choices - Life style / activity / exercise: Discussed.  2. Blood pressure  -  BP Readings from Last 1 Encounters:  08/13/23 132/70    - Control is in target.  - No change in current plans.   3. Lipid status / Hyperlipidemia - Last  Lab Results  Component Value Date   LDLCALC 89 08/08/2023   - Continue rosuvastatin  40 mg daily.  # Primary hypothyroidism -Continue levothyroxine  112 mcg daily.  Recent thyroid  function test contradicting, she had normal TSH in April and had mildly low TSH this month.  She has no hyperthyroid symptoms.  Will continue current dose of levothyroxine  and will check the thyroid  function test in follow-up visit.  # Osteoporosis - She has clinical osteoporosis due to fragility fractures.  She had right wrist fracture in April 2025 this year and had L1 vertebral compression fracture in January 2024 from fall.  DEXA scan in January 2023 consistent with normal bone mineral density.  She has degenerative changes on the spine probably causing falsely elevation of bone mineral density. - Advised to continue current dose of calcium  and vitamin D  supplement. - Patient needs antiresorptive therapy or pharmacological treatment for osteoporosis.  Patient denies taking any bone medication in the past.  Based on chart review , bone mineral density was being monitored by GYN /  ? PCP. - She is currently recovering from right wrist fracture.  Plan: - Patient is asked to discuss with OB/GYN provider to have new DEXA exam scan.  It is preferred to doing the same place for the comparison, and asked to fax result to our clinic. - Will plan for pharmacological treatment for osteoporosis after the DEXA scan. - Discussed about fall precautions. - Will follow-up in coming June if DEXA scan can be done before that, and make of further plan for treatment of osteoporosis.   Diagnoses and all orders for this visit:  Diabetes mellitus type 2 with complications (HCC) -     dapagliflozin  propanediol (FARXIGA ) 10 MG TABS tablet; Take 1 tablet (10 mg total) by mouth daily before breakfast. -     TRESIBA  FLEXTOUCH 200 UNIT/ML FlexTouch Pen; INJECT 20 UNITS SUBCUTANEOUSLY IN THE MORNING  Age-related osteoporosis with current pathological fracture, initial encounter  Postmenopausal osteoporosis    DISPOSITION Follow up in clinic in 3 months suggested.  Earlier in June if possible as mentioned above.   All questions answered and patient verbalized understanding of the plan.  [place] Darin Redmann, MD [facility] Endocrinology [facility] [address] [place], [address] [place], [place] [address] Phone # [phone]  At least part of this note was generated using voice recognition software. Inadvertent word errors may have  occurred, which were not recognized during the proofreading process.

## 2023-08-13 NOTE — Patient Instructions (Signed)
 Have your bone density test done, talk with GYN doctor, to be done in the same place you had done before.  Continue vitamin D  and calcium .  After your bone density is done asked to send report to our clinic.  I would like to see you after your bone density test is done we need to start you on bone medication.  Diabetes regimen:  Decrease tresiba  20 units in the morning. Metformin  1000 mg two times a day. Farxiga  10 mg daily. Novolog  sliding scale before meals.  Mild Sliding Scale Blood Glucose        Insulin  60-150                     None 151-200                   1 unit 201-250                   2 units 251-300                   4 units 301-350                   6 units 351-400                   8 units      >400                        9 units and call provider   Take glucose tablet, over the counter for low sugar 2-4 tablets at a time.

## 2023-08-15 DIAGNOSIS — M79662 Pain in left lower leg: Secondary | ICD-10-CM | POA: Diagnosis not present

## 2023-08-15 DIAGNOSIS — Z6833 Body mass index (BMI) 33.0-33.9, adult: Secondary | ICD-10-CM | POA: Diagnosis not present

## 2023-08-15 DIAGNOSIS — M8588 Other specified disorders of bone density and structure, other site: Secondary | ICD-10-CM | POA: Diagnosis not present

## 2023-08-15 DIAGNOSIS — M7989 Other specified soft tissue disorders: Secondary | ICD-10-CM | POA: Diagnosis not present

## 2023-08-18 ENCOUNTER — Other Ambulatory Visit (HOSPITAL_COMMUNITY): Payer: Self-pay | Admitting: Physical Medicine and Rehabilitation

## 2023-08-18 ENCOUNTER — Ambulatory Visit (HOSPITAL_COMMUNITY)
Admission: RE | Admit: 2023-08-18 | Discharge: 2023-08-18 | Disposition: A | Discharge status: Home / Self Care | Source: Ambulatory Visit | Attending: Surgery | Admitting: Surgery

## 2023-08-18 DIAGNOSIS — M79605 Pain in left leg: Secondary | ICD-10-CM | POA: Diagnosis not present

## 2023-08-19 ENCOUNTER — Ambulatory Visit (INDEPENDENT_AMBULATORY_CARE_PROVIDER_SITE_OTHER): Admitting: Internal Medicine

## 2023-08-19 ENCOUNTER — Encounter: Payer: Self-pay | Admitting: Internal Medicine

## 2023-08-19 VITALS — BP 152/78 | HR 78 | Temp 98.4°F | Ht 65.0 in | Wt 200.0 lb

## 2023-08-19 DIAGNOSIS — E1169 Type 2 diabetes mellitus with other specified complication: Secondary | ICD-10-CM

## 2023-08-19 DIAGNOSIS — I10 Essential (primary) hypertension: Secondary | ICD-10-CM | POA: Diagnosis not present

## 2023-08-19 DIAGNOSIS — T8484XD Pain due to internal orthopedic prosthetic devices, implants and grafts, subsequent encounter: Secondary | ICD-10-CM

## 2023-08-19 DIAGNOSIS — E039 Hypothyroidism, unspecified: Secondary | ICD-10-CM

## 2023-08-19 DIAGNOSIS — M5416 Radiculopathy, lumbar region: Secondary | ICD-10-CM | POA: Diagnosis not present

## 2023-08-19 DIAGNOSIS — M0579 Rheumatoid arthritis with rheumatoid factor of multiple sites without organ or systems involvement: Secondary | ICD-10-CM

## 2023-08-19 DIAGNOSIS — Z96659 Presence of unspecified artificial knee joint: Secondary | ICD-10-CM

## 2023-08-19 DIAGNOSIS — Z0001 Encounter for general adult medical examination with abnormal findings: Secondary | ICD-10-CM

## 2023-08-19 DIAGNOSIS — Z0000 Encounter for general adult medical examination without abnormal findings: Secondary | ICD-10-CM

## 2023-08-19 DIAGNOSIS — E118 Type 2 diabetes mellitus with unspecified complications: Secondary | ICD-10-CM

## 2023-08-19 DIAGNOSIS — E785 Hyperlipidemia, unspecified: Secondary | ICD-10-CM

## 2023-08-19 MED ORDER — FUROSEMIDE 40 MG PO TABS: 80.0000 mg | ORAL_TABLET | Freq: Every day | ORAL | 0 refills | Status: AC

## 2023-08-19 MED ORDER — ROSUVASTATIN CALCIUM 40 MG PO TABS: 40.0000 mg | ORAL_TABLET | Freq: Every day | ORAL | 3 refills | Status: AC

## 2023-08-19 MED ORDER — LINACLOTIDE 290 MCG PO CAPS: 290.0000 ug | ORAL_CAPSULE | Freq: Every day | ORAL | 3 refills | Status: AC | PRN

## 2023-08-19 MED ORDER — MONTELUKAST SODIUM 10 MG PO TABS: 10.0000 mg | ORAL_TABLET | Freq: Every day | ORAL | 3 refills | Status: AC

## 2023-08-19 MED ORDER — AMLODIPINE BESYLATE 10 MG PO TABS: 10.0000 mg | ORAL_TABLET | Freq: Every day | ORAL | 3 refills | Status: AC

## 2023-08-19 NOTE — Progress Notes (Signed)
   Subjective:   Patient ID: Marissa Roberts, female    DOB: 24-Jun-1945, 78 y.o.   MRN: [id number]  HPI The patient is here for physical. Colon in next week at [facility] medical.   PMH, [facility], social history reviewed and updated  Review of Systems  Constitutional: Negative.   HENT: Negative.    Eyes: Negative.   Respiratory:  Negative for cough, chest tightness and shortness of breath.   Cardiovascular:  Negative for chest pain, palpitations and leg swelling.  Gastrointestinal:  Negative for abdominal distention, abdominal pain, constipation, diarrhea, nausea and vomiting.  Musculoskeletal:  Positive for arthralgias, back pain, gait problem and myalgias.  Skin: Negative.   Psychiatric/Behavioral: Negative.      Objective:  Physical Exam Constitutional:      Appearance: She is well-developed.  HENT:     Head: Normocephalic and atraumatic.  Cardiovascular:     Rate and Rhythm: Normal rate and regular rhythm.  Pulmonary:     Effort: Pulmonary effort is normal. No respiratory distress.     Breath sounds: Normal breath sounds. No wheezing or rales.  Abdominal:     General: Bowel sounds are normal. There is no distension.     Palpations: Abdomen is soft.     Tenderness: There is no abdominal tenderness. There is no rebound.  Musculoskeletal:        General: Tenderness present.     Cervical back: Normal range of motion.  Skin:    General: Skin is warm and dry.  Neurological:     Mental Status: She is alert and oriented to person, place, and time.     Coordination: Coordination abnormal.     Vitals:   08/19/23 1036 08/19/23 1044 08/19/23 1102  BP: (!) 160/100 (!) 160/100 (!) 152/78  Pulse: 78    Temp: 98.4 F (36.9 C)    TempSrc: Oral    SpO2: 98%    Weight: 200 lb (90.7 kg)    Height: 5\' 5"  (1.651 m)      Assessment & Plan:

## 2023-08-19 NOTE — Assessment & Plan Note (Signed)
 Flu shot yearly. Pneumonia complete. Shingrix complete. Tetanus up to date. Colonoscopy getting within 1 week at [facility]. Mammogram up to date, pap smear aged out and dexa complete. Counseled about sun safety and mole surveillance. Counseled about the dangers of distracted driving. Given 10 year screening recommendations.

## 2023-08-19 NOTE — Assessment & Plan Note (Signed)
 Still having pain in her knee but improvement in range of motion. She is having lumbar radiculopathy issues making it difficult to know the etiology of her pain.

## 2023-08-19 NOTE — Assessment & Plan Note (Signed)
 BP elevated today which is unusual for her. Continue valsartan  and amlodipine  and lasix .

## 2023-08-19 NOTE — Assessment & Plan Note (Signed)
 Labs normal recently and continue levothyroxine .

## 2023-08-19 NOTE — Assessment & Plan Note (Signed)
 Lipid panel up to date reviewed with her and continue crestor .

## 2023-08-19 NOTE — Assessment & Plan Note (Signed)
 Stable without flare but has significant sequelae of this long term condition. She is taking arava  and seeing rheumatology

## 2023-08-19 NOTE — Assessment & Plan Note (Signed)
 Labs are up to date and seeing endo for management. She is stable. Foot exam and eye exam up to date. UACR and CMP and lipid panel up to date. On ARB and statin.

## 2023-08-19 NOTE — Assessment & Plan Note (Signed)
 Seeing neurosurgery and getting epidural injections which help for periods of time.

## 2023-08-20 DIAGNOSIS — M48062 Spinal stenosis, lumbar region with neurogenic claudication: Secondary | ICD-10-CM | POA: Diagnosis not present

## 2023-08-20 DIAGNOSIS — M5416 Radiculopathy, lumbar region: Secondary | ICD-10-CM | POA: Diagnosis not present

## 2023-08-21 DIAGNOSIS — E119 Type 2 diabetes mellitus without complications: Secondary | ICD-10-CM | POA: Diagnosis not present

## 2023-08-21 DIAGNOSIS — K2270 Barrett's esophagus without dysplasia: Secondary | ICD-10-CM | POA: Diagnosis not present

## 2023-08-21 DIAGNOSIS — Z1211 Encounter for screening for malignant neoplasm of colon: Secondary | ICD-10-CM | POA: Diagnosis not present

## 2023-08-31 DIAGNOSIS — K2270 Barrett's esophagus without dysplasia: Secondary | ICD-10-CM | POA: Diagnosis not present

## 2023-09-01 DIAGNOSIS — S52501A Unspecified fracture of the lower end of right radius, initial encounter for closed fracture: Secondary | ICD-10-CM | POA: Diagnosis not present

## 2023-09-02 DIAGNOSIS — D84821 Immunodeficiency due to drugs: Secondary | ICD-10-CM | POA: Diagnosis not present

## 2023-09-07 DIAGNOSIS — E1165 Type 2 diabetes mellitus with hyperglycemia: Secondary | ICD-10-CM | POA: Diagnosis not present

## 2023-09-08 ENCOUNTER — Other Ambulatory Visit: Payer: Self-pay

## 2023-09-08 DIAGNOSIS — E118 Type 2 diabetes mellitus with unspecified complications: Secondary | ICD-10-CM

## 2023-09-08 MED ORDER — ACCU-CHEK GUIDE W/DEVICE KIT: PACK | 0 refills | Status: AC

## 2023-09-08 MED ORDER — ACCU-CHEK SOFTCLIX LANCETS MISC: 12 refills | Status: AC

## 2023-09-08 MED ORDER — ACCU-CHEK GUIDE TEST VI STRP: ORAL_STRIP | 12 refills | Status: AC

## 2023-09-18 ENCOUNTER — Other Ambulatory Visit: Payer: Self-pay | Admitting: Endocrinology

## 2023-09-18 DIAGNOSIS — I10 Essential (primary) hypertension: Secondary | ICD-10-CM

## 2023-09-18 DIAGNOSIS — E118 Type 2 diabetes mellitus with unspecified complications: Secondary | ICD-10-CM

## 2023-09-18 NOTE — Telephone Encounter (Signed)
 Refill request complete

## 2023-09-23 ENCOUNTER — Ambulatory Visit (INDEPENDENT_AMBULATORY_CARE_PROVIDER_SITE_OTHER): Payer: Medicare Other | Admitting: Podiatry

## 2023-09-23 ENCOUNTER — Encounter: Payer: Self-pay | Admitting: Podiatry

## 2023-09-23 DIAGNOSIS — B351 Tinea unguium: Secondary | ICD-10-CM | POA: Diagnosis not present

## 2023-09-23 DIAGNOSIS — Z6833 Body mass index (BMI) 33.0-33.9, adult: Secondary | ICD-10-CM | POA: Diagnosis not present

## 2023-09-23 DIAGNOSIS — M79675 Pain in left toe(s): Secondary | ICD-10-CM

## 2023-09-23 DIAGNOSIS — Z794 Long term (current) use of insulin: Secondary | ICD-10-CM

## 2023-09-23 DIAGNOSIS — Z9181 History of falling: Secondary | ICD-10-CM | POA: Diagnosis not present

## 2023-09-23 DIAGNOSIS — Z8719 Personal history of other diseases of the digestive system: Secondary | ICD-10-CM | POA: Diagnosis not present

## 2023-09-23 DIAGNOSIS — M79674 Pain in right toe(s): Secondary | ICD-10-CM | POA: Diagnosis not present

## 2023-09-23 DIAGNOSIS — E119 Type 2 diabetes mellitus without complications: Secondary | ICD-10-CM

## 2023-09-23 DIAGNOSIS — E6609 Other obesity due to excess calories: Secondary | ICD-10-CM | POA: Diagnosis not present

## 2023-09-23 NOTE — Patient Instructions (Signed)
   For dry or cracked skin, recommend daily use of Bag Balm Hand and Body Moistuizer which may be purchased at local drug store or on [employer].

## 2023-09-27 ENCOUNTER — Encounter: Payer: Self-pay | Admitting: Podiatry

## 2023-09-27 NOTE — Progress Notes (Signed)
 Subjective:  Patient ID: Marissa Roberts, female    DOB: 04/19/1945,  MRN: [id number]  Marissa JONETTA Slawinski presents to clinic today for preventative diabetic foot care and painful porokeratotic lesion(s) right lower extremity and painful mycotic toenails that limit ambulation. Painful toenails interfere with ambulation. Aggravating factors include wearing enclosed shoe gear. Pain is relieved with periodic professional debridement. Painful porokeratotic lesions are aggravated when weightbearing with and without shoegear. Pain is relieved with periodic professional debridement.  She is accompanied by her husband on today's visit. Chief Complaint  Patient presents with   Diabetes    Trim toenails. Saw Dr. Iraq Thapa - 08/13/2023; A1c - 6.3   New problem(s): None.   PCP is Rollene Almarie LABOR, MD.  Allergies  Allergen Reactions   Contrast Media [Iodinated Contrast Media]     Throat swells from ct contra   Adhesive [Tape]     No paper tape, causes blisters   Erythromycin Rash   Penicillins Rash    Did it involve swelling of the face/tongue/throat, SOB, or low BP? No Did it involve sudden or severe rash/hives, skin peeling, or any reaction on the inside of your mouth or nose? No Did you need to seek medical attention at a hospital or doctor's office? No When did it last happen?    Many years If all above answers are "NO", may proceed with cephalosporin use.     Review of Systems: Negative except as noted in the HPI.  Objective: No changes noted in today's physical examination. There were no vitals filed for this visit. [name] is a pleasant 78 y.o. female in NAD. AAO x 3.  Vascular Examination: Capillary refill time immediate b/l. Palpable pedal pulses. Pedal hair present b/l. No pain with calf compression b/l. Skin temperature gradient WNL b/l. No cyanosis or clubbing b/l. No ischemia or gangrene noted b/l. No edema noted b/l LE.  Neurological Examination: Sensation  grossly intact b/l with 10 gram monofilament. Vibratory sensation intact b/l. Pt has subjective symptoms of neuropathy.  Dermatological Examination: Pedal skin warm and supple b/l.  No open wounds b/l. No interdigital macerations. Toenails 1-5 b/l elongated, thickened, discolored with subungual debris. +Tenderness with dorsal palpation of nailplates. Porokeratotic lesion(s) noted distal tip of right 4th toe.  Musculoskeletal Examination: Muscle strength 5/5 to all lower extremity muscle groups bilaterally. Hammertoe(s) 2-5 b/l.SABRA No pain, crepitus or joint limitation noted with ROM b/l LE.  Patient ambulates independently without assistive aids.  Radiographs: None  Last A1c:      Latest Ref Rng & Units 08/08/2023    9:46 AM 07/16/2023    2:28 PM 05/12/2023    9:41 AM 02/06/2023    8:26 AM 10/01/2022    8:22 AM  Hemoglobin A1C  Hemoglobin-A1c <5.7 % 6.2  6.0  6.1  5.7  5.7    Assessment/Plan: 1. Pain due to onychomycosis of toenails of both feet   2. Type 2 diabetes mellitus without complication, with long-term current use of insulin  [facility])    Patient was evaluated and treated. All patient's and/or POA's questions/concerns addressed on today's visit. Mycotic toenails 1-5 debrided in length and girth without incident. Porokeratotic lesion(s) distal tip of right 4th toe pared with sharp debridement without incident. Continue daily foot inspections and monitor blood glucose per PCP/Endocrinologist's recommendations. Continue soft, supportive shoe gear daily. Report any pedal injuries to medical professional. Recommended Bag Balm Moisturizer for dry skin. Patient/POA to call should there be question/concern in the interim.   Return  in about 9 weeks (around 11/25/2023).  Delon LITTIE Merlin, DPM      [place] LOCATION: [address] [place] [address]                   Office [phone]   [facility] LOCATION: [address] [place], [place] [address] Office [phone]

## 2023-10-07 NOTE — Progress Notes (Signed)
 Office Visit Note  Patient: Marissa Roberts             Date of Birth: 1945-04-05           MRN: [id number]             PCP: Rollene Almarie LABOR, MD Referring: Rollene Almarie LABOR, * Visit Date: 10/21/2023 Occupation: @GUAROCC @  Subjective:  Left hand pain   History of Present Illness: Marissa Roberts is a 78 y.o. female with history of seropositive rheumatoid arthritis.  Patient remains on arava  20 mg 1 tablet by mouth daily.  She is tolerating Arava  without any side effects and has not had any recent gaps in therapy.  Patient states for the past 1 month she has been experiencing increased pain in the left hand.  She has noticed intermittent locking of the left ring finger.  She denies any joint swelling.  She has been massaging Voltaren  gel topically for pain relief and is also been taking Tylenol  650 mg 1-2 times per day.  Patient states that tramadol  causes drowsiness so she takes tramadol  sparingly at bedtime.  She continues to have chronic pain in her lower back.  Patient states that she did not have her maintenance injection due to wanting to see if her pain levels would be any different without the injection. Patient states that she has an upcoming appointment scheduled with endocrinology.  Patient states that she has had an updated bone density but has not yet heard back about the results or treatment options.  Patient has had a history of an L1 compression fracture as well as a right wrist fracture.  She is nave to osteoporosis treatment. She denies any recent or recurrent infections.  She denies any other new medical conditions.   Activities of Daily Living:  Patient denies morning stiffness  Patient Denies nocturnal pain.  Difficulty dressing/grooming: Denies Difficulty climbing stairs: Reports Difficulty getting out of chair: Reports Difficulty using hands for taps, buttons, cutlery, and/or writing: Reports  Review of Systems  Constitutional:  Positive for fatigue.   HENT:  Negative for mouth sores and mouth dryness.   Eyes:  Negative for dryness.  Respiratory:  Negative for shortness of breath.   Cardiovascular:  Negative for chest pain and palpitations.  Gastrointestinal:  Negative for blood in stool, constipation and diarrhea.  Endocrine: Negative for increased urination.  Genitourinary:  Negative for involuntary urination.  Musculoskeletal:  Positive for joint pain, joint pain, joint swelling, myalgias, muscle tenderness and myalgias. Negative for gait problem, muscle weakness and morning stiffness.  Skin:  Negative for color change, rash, hair loss and sensitivity to sunlight.  Allergic/Immunologic: Negative for susceptible to infections.  Neurological:  Negative for dizziness and headaches.  Hematological:  Negative for swollen glands.  Psychiatric/Behavioral:  Negative for depressed mood and sleep disturbance. The patient is not nervous/anxious.     PMFS History:  Patient Active Problem List   Diagnosis Date Noted   Lumbar radiculopathy 09/09/2022   Lumbar compression fracture (HCC) 03/29/2022   Anxiety 02/25/2022   Generalized subcutaneous nodules 11/22/2021   Pain in knee region after total knee replacement (HCC) 03/22/2020   B12 deficiency 03/23/2019   Anemia 02/05/2019   Rheumatoid arthritis with rheumatoid factor of multiple sites without organ or systems involvement (HCC) 01/25/2019   High risk medication use 01/25/2019   Fatigue 09/18/2018   Encounter for general adult medical examination with abnormal findings 02/07/2015   Hyperlipidemia associated with type 2 diabetes mellitus (HCC) 07/28/2009  Osteoporosis 03/14/2009   Diabetes mellitus type 2 with complications (HCC) 11/14/2006   Hypothyroidism 09/24/2006   Essential hypertension 09/24/2006    Past Medical History:  Diagnosis Date   Anemia    Arthritis    Bronchitis    hx of   Diabetes mellitus    type 2   Family history of adverse reaction to anesthesia    per  patient, daughter threw up after a knee surgery   GERD (gastroesophageal reflux disease)    Hypercholesteremia    Hypertension    Hypothyroidism    Pneumonia    hx of   PONV (postoperative nausea and vomiting)    Thyroid  disease     Family History  Problem Relation Age of Onset   Diabetes Mother    Heart disease Mother    Hyperlipidemia Mother    Diabetes Father    Diabetes Sister    Hypertension Sister    Heart disease Sister    Dementia Sister    Heart disease Brother    Healthy Daughter    Healthy Daughter    Healthy Daughter    Healthy Son    Past Surgical History:  Procedure Laterality Date   ABDOMINAL HYSTERECTOMY     partial   BREAST SURGERY     lumpectomy left breast   carpel tunnel     COLONOSCOPY  07/2020   COLONOSCOPY W/ POLYPECTOMY     FOOT SURGERY     knee arhtroscopy     KNEE ARTHROPLASTY Left 05/2020   KYPHOPLASTY N/A 04/08/2022   Procedure: KYPHOPLASTY L1;  Surgeon: Burnetta Aures, MD;  Location: [facility] OR;  Service: Orthopedics;  Laterality: N/A;  Local with [facility]   ORIF PATELLA Left 05/23/2020   Procedure: LEFT KNEE POLE PATELLA EXCISION;  Surgeon: Addie Cordella Hamilton, MD;  Location: [facility] OR;  Service: Orthopedics;  Laterality: Left;   SHOULDER ARTHROSCOPY WITH SUBACROMIAL DECOMPRESSION, ROTATOR CUFF REPAIR AND BICEP TENDON REPAIR Right 05/26/2012   Procedure: Right Shoulder Diagnostic Operative Arthroscopy, Subacromial Decompression, Biceps Tenodesis, Mini Open Rotator Cuff Repair;  Surgeon: Cordella Hamilton Addie, MD;  Location: [facility] OR;  Service: Orthopedics;  Laterality: Right;  Right Shoulder Diagnostic Operative Arthroscopy, Subacromial Decompression, Biceps Tenodesis, Mini Open Rotator Cuff Repair   TONSILLECTOMY     TOTAL KNEE ARTHROPLASTY Left 07/01/2019   Procedure: LEFT TOTAL KNEE ARTHROPLASTY;  Surgeon: Addie Cordella Hamilton, MD;  Location: [facility] OR;  Service: Orthopedics;  Laterality: Left;   UPPER GI ENDOSCOPY  07/2020   Social History   Social  History Narrative   Not on file   Immunization History  Administered Date(s) Administered   Fluad Quad(high Dose 65+) 01/02/2019, 12/31/2019, 12/24/2022   Influenza Split 01/03/2011   Influenza Whole 04/01/2005, 12/18/2009   Influenza, High Dose Seasonal PF 01/07/2014, 01/27/2015, 01/02/2016, 01/13/2017, 01/16/2018   Influenza-Unspecified 11/30/2012, 01/03/2021, 01/08/2022   PFIZER Comirnaty(Gray Top)Covid-19 Tri-Sucrose Vaccine 12/24/2022   PFIZER(Purple Top)SARS-COV-2 Vaccination 05/28/2019, 06/23/2019, 01/15/2020, 07/03/2020, 01/03/2021   PNEUMOCOCCAL CONJUGATE-20 05/10/2021   Pfizer(Comirnaty)Fall Seasonal Vaccine 12 years and older 01/08/2022   Pneumococcal Conjugate-13 09/12/2016, 01/02/2019   Pneumococcal Polysaccharide-23 07/15/2014, 12/27/2019   Respiratory Syncytial Virus Vaccine,Recomb Aduvanted(Arexvy) 01/08/2022   Td 04/01/2001, 10/07/2013   Zoster Recombinant(Shingrix) 01/09/2021, 03/12/2021     Objective: Vital Signs: BP 131/75 (BP Location: Right Arm, Patient Position: Sitting, Cuff Size: Normal)   Pulse 65   Resp 15   Ht 5' 5 (1.651 m)   Wt 206 lb 6.4 oz (93.6 kg)   BMI 34.35 kg/m  Physical Exam Vitals and nursing note reviewed.  Constitutional:      Appearance: She is well-developed.  HENT:     Head: Normocephalic and atraumatic.  Eyes:     Conjunctiva/sclera: Conjunctivae normal.  Cardiovascular:     Rate and Rhythm: Normal rate and regular rhythm.     Heart sounds: Normal heart sounds.  Pulmonary:     Effort: Pulmonary effort is normal.     Breath sounds: Normal breath sounds.  Abdominal:     General: Bowel sounds are normal.     Palpations: Abdomen is soft.  Musculoskeletal:     Cervical back: Normal range of motion.  Lymphadenopathy:     Cervical: No cervical adenopathy.  Skin:    General: Skin is warm and dry.     Capillary Refill: Capillary refill takes less than 2 seconds.  Neurological:     Mental Status: She is alert and oriented to  person, place, and time.  Psychiatric:        Behavior: Behavior normal.      Musculoskeletal Exam: C-spine has good range of motion.  Thoracic kyphosis noted.  Limited mobility of the lumbar spine.  Shoulder joints, elbow joints, wrist joints, MCPs, PIPs, DIPs have good range of motion with no synovitis.  CMC, PIP, DIP thickening consistent with osteoarthritis of both hands.  Left ring trigger finger noted.  Complete fist formation bilaterally.  Left knee replacement has good range of motion with mild discomfort.  Right knee has good range of motion no warmth or effusion.  No tenderness or swelling of ankle joints.  CDAI Exam: CDAI Score: -- Patient Global: --; Provider Global: -- Swollen: --; Tender: -- Joint Exam 10/21/2023   No joint exam has been documented for this visit   There is currently no information documented on the homunculus. Go to the Rheumatology activity and complete the homunculus joint exam.  Investigation: No additional findings.  Imaging: No results found.  Recent Labs: Lab Results  Component Value Date   WBC 10.0 07/16/2023   HGB 12.4 07/16/2023   PLT 267.0 07/16/2023   NA 139 08/08/2023   K 4.0 08/08/2023   CL 104 08/08/2023   CO2 24 08/08/2023   GLUCOSE 177 (H) 08/08/2023   BUN 23 08/08/2023   CREATININE 0.80 08/08/2023   BILITOT 0.3 07/16/2023   ALKPHOS 72 07/16/2023   AST 19 07/16/2023   ALT 19 07/16/2023   PROT 7.2 07/16/2023   ALBUMIN 4.3 07/16/2023   CALCIUM  9.8 08/08/2023   GFRAA 101 09/18/2020   QFTBGOLDPLUS NEGATIVE 01/09/2021    Speciality Comments: PLQ Eye Exam: 12/22/2019 WNL @ [facility] follow up in 6 months  Methotrexate -hair loss  Procedures:  No procedures performed Allergies: Contrast media [iodinated contrast media], Adhesive [tape], Erythromycin, and Penicillins   Assessment / Plan:     Visit Diagnoses: Rheumatoid arthritis involving multiple sites with positive rheumatoid factor (HCC): She has no synovitis on  examination today.  She is not exhibiting any signs or symptoms of a rheumatoid arthritis flare.  She has clinically been doing well taking Arava  20 mg 1 tablet by mouth daily.  She is tolerating Arava  without any side effects and has not had any recent gaps in therapy.  No recent or recurrent infections.  She is been experiencing some increased discomfort in the left hand most likely related to the left ring trigger finger.  Discussed that we can schedule ultrasound-guided injection if needed.  Discussed the use of arthritis compression gloves.  She has been taking Tylenol  650 mg 1 to 2 tablets daily for pain relief primarily to alleviate her lower back pain.  Plan to check CBC and CMP today.   She will remain on Arava  as prescribed.  She was advised to notify us  if she develops any new or worsening symptoms.  She will follow up in 5 months or sooner if needed.   High risk medication use - Arava  20 mg 1 tablet by mouth daily.  BMP updated on 08/08/23. Orders for CBC and CMP released today.  Lipid panel 08/08/23  No recent or recurrent infections.  Discussed the importance of holding arava  if she develops signs or symptoms of an infection and to resume once the infection has completely cleared.   - Plan: CBC with Differential/Platelet, Comprehensive metabolic panel with GFR  Primary osteoarthritis of both hands: CMC, PIP, DIP thickening consistent with osteoarthritis of both hands.  No synovitis noted on examination today.  Discussed the use of arthritis compression gloves.  She has been taking Tylenol  650 mg 1 to 2 tablets daily as needed for pain relief.   Trigger finger, left ring finger: Conservative treatment options were discussed.  She will notify us  if her symptoms persist or worsen at which time she can schedule an ultrasound-guided injection.  Trochanteric bursitis, left hip: Not currently symptomatic.   S/P total knee replacement, left: No effusion noted.  She occasionally will use a cane to  assist with ambulation if needed.  Primary osteoarthritis of both feet: She is not experiencing any discomfort in her feet at this time.  She has good range of motion of both ankle joints with no tenderness or synovitis.  She has been wearing compression socks on a daily basis which has helped with the pain in her feet and lower legs.  History of osteopenia - DEXA: 04/23/2021 T-score: -0.6, BMD: 1.009  AP Spine which was within normal limits--ordered by gynecologist. She is taking a calcium  and vitamin D  supplement daily. Discussed that she will require treatment for osteoporosis given that she has an L1 compression fracture and a recent right wrist fracture. According the patient she has had an updated DEXA but has not yet heard back about the results.  She plans on discussing DEXA results and treatment options at her upcoming appointment with endocrinology in August 2025.  History of vertebral fracture - L1 compression fracture after a fall in January 2024.  She underwent a kyphoplasty on 04/08/2022.  She will be following up with her endocrinologist to discuss recent DEXA results and treatment options.  Vitamin D  deficiency: She is taking a daily calcium  and vitamin D  supplement.  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  History of hypothyroidism  Hyperlipidemia associated with type 2 diabetes mellitus (HCC): Lipid panel updated on 08/08/2023.  Demyelinating neuropathy  Diabetes mellitus type 2 with complications (HCC)  Orders: Orders Placed This Encounter  Procedures   CBC with Differential/Platelet   Comprehensive metabolic panel with GFR   Meds ordered this encounter  Medications   leflunomide  (ARAVA ) 20 MG tablet    Sig: Take 1 tablet (20 mg total) by mouth daily.    Dispense:  90 tablet    Refill:  0     Follow-Up Instructions: Return in about 5 months (around 03/22/2024) for Rheumatoid arthritis.   Waddell CHRISTELLA Craze, PA-C  Note - This record has been created using [employer].  Chart creation errors have been sought, but may not always  have been located. Such creation errors do not reflect on  the standard of medical care.

## 2023-10-13 DIAGNOSIS — S52501A Unspecified fracture of the lower end of right radius, initial encounter for closed fracture: Secondary | ICD-10-CM | POA: Diagnosis not present

## 2023-10-21 ENCOUNTER — Telehealth: Payer: Self-pay

## 2023-10-21 ENCOUNTER — Encounter: Payer: Self-pay | Admitting: Physician Assistant

## 2023-10-21 ENCOUNTER — Ambulatory Visit: Payer: Medicare Other | Attending: Physician Assistant | Admitting: Physician Assistant

## 2023-10-21 VITALS — BP 131/75 | HR 65 | Resp 15 | Ht 65.0 in | Wt 206.4 lb

## 2023-10-21 DIAGNOSIS — Z79899 Other long term (current) drug therapy: Secondary | ICD-10-CM

## 2023-10-21 DIAGNOSIS — E1144 Type 2 diabetes mellitus with diabetic amyotrophy: Secondary | ICD-10-CM

## 2023-10-21 DIAGNOSIS — M0579 Rheumatoid arthritis with rheumatoid factor of multiple sites without organ or systems involvement: Secondary | ICD-10-CM

## 2023-10-21 DIAGNOSIS — M19041 Primary osteoarthritis, right hand: Secondary | ICD-10-CM

## 2023-10-21 DIAGNOSIS — M19042 Primary osteoarthritis, left hand: Secondary | ICD-10-CM

## 2023-10-21 DIAGNOSIS — I10 Essential (primary) hypertension: Secondary | ICD-10-CM

## 2023-10-21 DIAGNOSIS — G629 Polyneuropathy, unspecified: Secondary | ICD-10-CM

## 2023-10-21 DIAGNOSIS — Z8781 Personal history of (healed) traumatic fracture: Secondary | ICD-10-CM

## 2023-10-21 DIAGNOSIS — M65342 Trigger finger, left ring finger: Secondary | ICD-10-CM

## 2023-10-21 DIAGNOSIS — E118 Type 2 diabetes mellitus with unspecified complications: Secondary | ICD-10-CM

## 2023-10-21 DIAGNOSIS — M19071 Primary osteoarthritis, right ankle and foot: Secondary | ICD-10-CM

## 2023-10-21 DIAGNOSIS — E559 Vitamin D deficiency, unspecified: Secondary | ICD-10-CM

## 2023-10-21 DIAGNOSIS — E1169 Type 2 diabetes mellitus with other specified complication: Secondary | ICD-10-CM

## 2023-10-21 DIAGNOSIS — M19072 Primary osteoarthritis, left ankle and foot: Secondary | ICD-10-CM

## 2023-10-21 DIAGNOSIS — Z8639 Personal history of other endocrine, nutritional and metabolic disease: Secondary | ICD-10-CM

## 2023-10-21 DIAGNOSIS — Z96652 Presence of left artificial knee joint: Secondary | ICD-10-CM

## 2023-10-21 DIAGNOSIS — E785 Hyperlipidemia, unspecified: Secondary | ICD-10-CM

## 2023-10-21 DIAGNOSIS — M7062 Trochanteric bursitis, left hip: Secondary | ICD-10-CM

## 2023-10-21 DIAGNOSIS — Z8739 Personal history of other diseases of the musculoskeletal system and connective tissue: Secondary | ICD-10-CM

## 2023-10-21 LAB — COMPREHENSIVE METABOLIC PANEL WITH GFR
AG Ratio: 1.8 (calc) (ref 1.0–2.5)
ALT: 13 U/L (ref 6–29)
AST: 14 U/L (ref 10–35)
Albumin: 4.1 g/dL (ref 3.6–5.1)
Alkaline phosphatase (APISO): 62 U/L (ref 37–153)
BUN: 16 mg/dL (ref 7–25)
CO2: 26 mmol/L (ref 20–32)
Calcium: 9.8 mg/dL (ref 8.6–10.4)
Chloride: 105 mmol/L (ref 98–110)
Creat: 0.69 mg/dL (ref 0.60–1.00)
Globulin: 2.3 g/dL (ref 1.9–3.7)
Glucose, Bld: 89 mg/dL (ref 65–99)
Potassium: 4.1 mmol/L (ref 3.5–5.3)
Sodium: 140 mmol/L (ref 135–146)
Total Bilirubin: 0.2 mg/dL (ref 0.2–1.2)
Total Protein: 6.4 g/dL (ref 6.1–8.1)
eGFR: 90 mL/min/1.73m2 (ref >=60)

## 2023-10-21 LAB — CBC WITH DIFFERENTIAL/PLATELET
Absolute Lymphocytes: 3534 {cells}/uL (ref 850–3900)
Absolute Monocytes: 921 {cells}/uL (ref 200–950)
Basophils Absolute: 50 {cells}/uL (ref 0–200)
Basophils Relative: 0.5 %
Eosinophils Absolute: 198 {cells}/uL (ref 15–500)
Eosinophils Relative: 2 %
HCT: 35.6 % (ref 35.0–45.0)
Hemoglobin: 11.4 g/dL — ABNORMAL LOW (ref 11.7–15.5)
MCH: 28.6 pg (ref 27.0–33.0)
MCHC: 32 g/dL (ref 32.0–36.0)
MCV: 89.4 fL (ref 80.0–100.0)
MPV: 11 fL (ref 7.5–12.5)
Monocytes Relative: 9.3 %
Neutro Abs: 5198 {cells}/uL (ref 1500–7800)
Neutrophils Relative %: 52.5 %
Platelets: 245 Thousand/uL (ref 140–400)
RBC: 3.98 Million/uL (ref 3.80–5.10)
RDW: 13.7 % (ref 11.0–15.0)
Total Lymphocyte: 35.7 %
WBC: 9.9 Thousand/uL (ref 3.8–10.8)

## 2023-10-21 MED ORDER — LEFLUNOMIDE 20 MG PO TABS: 20.0000 mg | ORAL_TABLET | Freq: Every day | ORAL | 0 refills | Status: DC

## 2023-10-21 NOTE — Telephone Encounter (Signed)
 Pt called about her Dexa Scan results that were done on 08/15/23. ByDr. Lynwood Clubs   Results are under the Media (Scanned on 09/09/23 [facility])  Scoll down to page 8.   Please Advise in Dr. Mercie absence.

## 2023-10-22 ENCOUNTER — Other Ambulatory Visit: Payer: Self-pay | Admitting: Physician Assistant

## 2023-10-22 ENCOUNTER — Ambulatory Visit: Payer: Self-pay | Admitting: Physician Assistant

## 2023-10-22 NOTE — Progress Notes (Signed)
 CMP WNL Hemoglobin is slightly low-11.4. rest of CBC WNL.

## 2023-10-31 NOTE — Telephone Encounter (Signed)
 I reviewed DEXA scan completed in May 2025 and scanned into media.  Will discussed with patient upcoming follow-up visit in August 22 and make a plan for treatment for osteoporosis.

## 2023-11-13 ENCOUNTER — Telehealth: Payer: Self-pay

## 2023-11-13 NOTE — Telephone Encounter (Signed)
 Spoke with patient. Patient made aware this was sent to Mychart, however she sates she does not check her Mychart. Patient understands that MD wants to wait and discuss her bone density results on her upcoming appointment as well as treatment plan at that time. No further questions.

## 2023-11-13 NOTE — Telephone Encounter (Signed)
  Please refer to my note from encounter comment on August 1.   I reviewed DEXA scan completed in May 2025 and scanned into media.  Will discussed with patient upcoming follow-up visit in August 22 and make a plan for treatment for osteoporosis.

## 2023-11-17 ENCOUNTER — Telehealth: Payer: Self-pay

## 2023-11-17 NOTE — Telephone Encounter (Signed)
 Copied from CRM [id number]. Topic: Referral - Question >> Nov 17, 2023  2:24 PM Drema MATSU wrote: Reason for CRM: Patient received a letter from [employer] letting her know that her GI doctor is moving away and she needs another one. Patient wants recommendations on GI doctors.

## 2023-11-18 NOTE — Telephone Encounter (Signed)
 I don't think I am aware she has a GI doctor currently. Who is she seeing currently and for what condition?

## 2023-11-18 NOTE — Telephone Encounter (Signed)
**Note De-identified  [place] Obfuscation** Please advise 

## 2023-11-20 NOTE — Telephone Encounter (Signed)
 She was seeing Dr. Toribio Cola, MD for stomach pains and they have also did her colonoscopy as well

## 2023-11-21 ENCOUNTER — Ambulatory Visit: Payer: Self-pay | Admitting: Endocrinology

## 2023-11-21 ENCOUNTER — Telehealth: Payer: Self-pay | Admitting: Pharmacy Technician

## 2023-11-21 ENCOUNTER — Encounter: Payer: Self-pay | Admitting: Endocrinology

## 2023-11-21 ENCOUNTER — Ambulatory Visit: Admitting: Endocrinology

## 2023-11-21 VITALS — BP 138/68 | HR 73 | Resp 20 | Ht 65.0 in | Wt 210.4 lb

## 2023-11-21 DIAGNOSIS — E559 Vitamin D deficiency, unspecified: Secondary | ICD-10-CM | POA: Diagnosis not present

## 2023-11-21 DIAGNOSIS — M810 Age-related osteoporosis without current pathological fracture: Secondary | ICD-10-CM | POA: Diagnosis not present

## 2023-11-21 DIAGNOSIS — E118 Type 2 diabetes mellitus with unspecified complications: Secondary | ICD-10-CM | POA: Diagnosis not present

## 2023-11-21 DIAGNOSIS — Z794 Long term (current) use of insulin: Secondary | ICD-10-CM

## 2023-11-21 DIAGNOSIS — E063 Autoimmune thyroiditis: Secondary | ICD-10-CM

## 2023-11-21 DIAGNOSIS — Z7984 Long term (current) use of oral hypoglycemic drugs: Secondary | ICD-10-CM

## 2023-11-21 DIAGNOSIS — M8000XA Age-related osteoporosis with current pathological fracture, unspecified site, initial encounter for fracture: Secondary | ICD-10-CM | POA: Diagnosis not present

## 2023-11-21 LAB — MICROALBUMIN / CREATININE URINE RATIO
Creatinine, Urine: 36 mg/dL (ref 20–275)
Microalb Creat Ratio: 258 mg/g{creat} — ABNORMAL HIGH (ref <30)
Microalb, Ur: 9.3 mg/dL

## 2023-11-21 LAB — POCT GLYCOSYLATED HEMOGLOBIN (HGB A1C): Hemoglobin A1C: 6.3 % — AB (ref 4.0–5.6)

## 2023-11-21 LAB — T4, FREE: Free T4: 1.4 ng/dL (ref 0.8–1.8)

## 2023-11-21 LAB — VITAMIN D 25 HYDROXY (VIT D DEFICIENCY, FRACTURES): Vit D, 25-Hydroxy: 40 ng/mL (ref 30–100)

## 2023-11-21 LAB — TSH: TSH: 1.39 m[IU]/L (ref 0.40–4.50)

## 2023-11-21 NOTE — Telephone Encounter (Signed)
 Likely would need visit for referral and we would also need records from current GI in case a new GI is needed.

## 2023-11-21 NOTE — Telephone Encounter (Signed)
 Dr. Mercie, Patient will be scheduled as soon as possible.  Auth Submission: NO AUTH NEEDED Site of care: Site of care: [facility] Payer: [employer] / [employer] [place] Medication & CPT/J Code(s) submitted: Reclast (Zolendronic acid) [id number] Diagnosis Code:  Route of submission (phone, fax, portal):  Phone # Fax # Auth type: Buy/Bill PB Units/visits requested: X1 DOSE Reference number:  Approval from: 11/21/23 to 03/31/24

## 2023-11-21 NOTE — Telephone Encounter (Signed)
 Called patient and LVM.

## 2023-11-21 NOTE — Patient Instructions (Signed)
 Have your bone density test done, talk with GYN doctor, to be done in the same place you had done before.  Continue vitamin D  and calcium .  Will plan for Reclast injection medication.   Diabetes regimen:No change.

## 2023-11-21 NOTE — Telephone Encounter (Signed)
 Patient had returned my call to the office and sarah took this call and informed me she has scheduled the patient for a office visit.

## 2023-11-21 NOTE — Progress Notes (Signed)
 Outpatient Endocrinology Note [place] Towanna Avery, MD  11/21/23  Patient's Name: Marissa Roberts    DOB: [birth year]    MRN: [id number]                                                    REASON OF VISIT: Follow up of type 2 diabetes mellitus / Hypothyroidism /osteoporosis  PCP: Rollene Almarie LABOR, MD  HISTORY OF PRESENT ILLNESS:   Marissa Roberts is a 78 y.o. old female with past medical history listed below, is here for follow up for type 2 diabetes mellitus / osteoporosis.   Pertinent Diabetes History: Patient was diagnosed with type 2 diabetes mellitus in 2005.  Patient has controlled type 2 diabetes mellitus.  Chronic Diabetes Complications : Retinopathy: no. Last ophthalmology exam was done on annuallyy, following with ophthalmology regularly.  Nephropathy: yes, microalbuminuria present., on ACE/ARB /valsartan . Peripheral neuropathy: no Coronary artery disease: no Stroke: on  Relevant comorbidities and cardiovascular risk factors: Obesity: yes Body mass index is 35.01 kg/m.  Hypertension: Yes  Hyperlipidemia : Yes, on statin   Current / Home Diabetic regimen includes: Tresiba  20 units in the morning.  Novolog  occasionally 1 - 5 units per sliding scale. Very seldom.  Metformin  1000 mg 2 times a day. Farxiga  10 mg daily.  Prior diabetic medications: Kombiglyze, Byetta .  V-Go pump in the past, A stopped due to cost, skin irritation and discomfort.  She had taken Lantus  in the past.  Glycemic data:    CONTINUOUS GLUCOSE MONITORING SYSTEM (CGMS) INTERPRETATION: At today's visit, we reviewed CGM downloads. The full report is scanned in the media. Reviewing the CGM trends, blood glucose are as follows:  Dexcom  CGM-  Sensor Download (Sensor download was reviewed and summarized below.) Dates: August 9 to November 21, 2023, 14 days  Glucose Management Indicator: 6.6%     Interpretation: Mostly acceptable blood sugar.  Normal blood sugar overnight.  Here hyperglycemia  with blood sugar in the low 200 range usually with supper and occasionally with lunch.  No hypoglycemia.  No concerning hyperglycemia.  Hypoglycemia: Patient has no hypoglycemic episodes. Patient has hypoglycemia awareness.  Factors modifying glucose control: 1.  Diabetic diet assessment: 3 meals a day.  2.  Staying active or exercising: No formal exercise.  3.  Medication compliance: compliant all of the time.  # Primary hypothyroidism -She has been hypothyroid for 40+ years.  She has been taking levothyroxine  since her diagnosis.  She is currently taking levothyroxine  112 mcg daily.  # Osteoporosis: Patient had L1 compression fracture after a fall in January 2024 and underwent kyphoplasty in July 2024. Patient had right wrist fracture after a mechanical fall from standing height, into the rock in mid April 2025.  Fractures concerning for osteoporotic/fragility fractures.  Patient has rheumatoid arthritis, follow-up with rheumatology.  Has osteoarthritis and degenerative changes on the spine.  Patient had multiple steroid injections in the past into the back.  Patient had normal parathyroid  hormone and calcium  levels in the past.  Patient has been taking calcium /vitamin D  supplement daily along with multivitamin 1 tablet daily.  Does not recall the dose.  Problem list in 2010 mentioned about having osteoporosis.  Patient does not recall she was diagnosed with osteoporosis.  Patient denies taking any antiresorptive therapy for the bone health or osteoporosis.  Last time her DEXA  scan was done by OB/GYN in January 2023, at [facility]. DEXA scan last done in January 2023 reports reviewed consistent normal bone mineral density.  DEXA scan completed on May 16 and 20, 2025 at physicians [facility] : Overall normal bone mineral density however she has established osteoporosis, T-score at L2-L4 -0.1, T-score left femoral neck -0.9 and right femoral neck -0.4.   Reviewed report scanned into media.  Interval history  Dexcom data as reviewed above.  Diabetes has been as reviewed and noted above.  Hemoglobin A1c 6.3%.  She had gained some weight.  Patient is concerned about if she is gaining weight due to decreasing dose of Tresiba .  Discussed that it is not related to decreasing dose of Tresiba .  Discussed about limiting carbohydrate and exercise.  She has no new complaints.  She had completed DEXA scan in May reviewed and noted as above.  Her wrist fracture has healed.  No new fall and fracture.  She reports taking calcium /vitamin D  and multivitamin daily.  Does not recall the dose.  She has been taking levothyroxine  112 mcg daily, reports compliance.  Denies palpitation and heat intolerance.  No other complaints today.  REVIEW OF SYSTEMS As per history of present illness.   PAST MEDICAL HISTORY: Past Medical History:  Diagnosis Date   Anemia    Arthritis    Bronchitis    hx of   Diabetes mellitus    type 2   Family history of adverse reaction to anesthesia    per patient, daughter threw up after a knee surgery   GERD (gastroesophageal reflux disease)    Hypercholesteremia    Hypertension    Hypothyroidism    Pneumonia    hx of   PONV (postoperative nausea and vomiting)    Thyroid  disease     PAST SURGICAL HISTORY: Past Surgical History:  Procedure Laterality Date   ABDOMINAL HYSTERECTOMY     partial   BREAST SURGERY     lumpectomy left breast   carpel tunnel     COLONOSCOPY  07/2020   COLONOSCOPY W/ POLYPECTOMY     FOOT SURGERY     knee arhtroscopy     KNEE ARTHROPLASTY Left 05/2020   KYPHOPLASTY N/A 04/08/2022   Procedure: KYPHOPLASTY L1;  Surgeon: Burnetta Aures, MD;  Location: [facility] OR;  Service: Orthopedics;  Laterality: N/A;  [facility] with [facility]   ORIF PATELLA Left 05/23/2020   Procedure: LEFT KNEE POLE PATELLA EXCISION;  Surgeon: Addie Cordella Hamilton, MD;  Location: [facility] OR;  Service: Orthopedics;  Laterality: Left;    SHOULDER ARTHROSCOPY WITH SUBACROMIAL DECOMPRESSION, ROTATOR CUFF REPAIR AND BICEP TENDON REPAIR Right 05/26/2012   Procedure: Right Shoulder Diagnostic Operative Arthroscopy, Subacromial Decompression, Biceps Tenodesis, Mini Open Rotator Cuff Repair;  Surgeon: Cordella Hamilton Addie, MD;  Location: [facility] OR;  Service: Orthopedics;  Laterality: Right;  Right Shoulder Diagnostic Operative Arthroscopy, Subacromial Decompression, Biceps Tenodesis, Mini Open Rotator Cuff Repair   TONSILLECTOMY     TOTAL KNEE ARTHROPLASTY Left 07/01/2019   Procedure: LEFT TOTAL KNEE ARTHROPLASTY;  Surgeon: Addie Cordella Hamilton, MD;  Location: [facility] OR;  Service: Orthopedics;  Laterality: Left;   UPPER GI ENDOSCOPY  07/2020    ALLERGIES: Allergies  Allergen Reactions   Contrast Media [Iodinated Contrast Media]     Throat swells from ct contra   Adhesive [Tape]     No paper tape, causes blisters   Erythromycin Rash   Penicillins Rash    Did it involve swelling  of the face/tongue/throat, SOB, or low BP? No Did it involve sudden or severe rash/hives, skin peeling, or any reaction on the inside of your mouth or nose? No Did you need to seek medical attention at a hospital or doctor's office? No When did it last happen?    Many years If all above answers are "NO", may proceed with cephalosporin use.     FAMILY HISTORY:  Family History  Problem Relation Age of Onset   Diabetes Mother    Heart disease Mother    Hyperlipidemia Mother    Diabetes Father    Diabetes Sister    Hypertension Sister    Heart disease Sister    Dementia Sister    Heart disease Brother    Healthy Daughter    Healthy Daughter    Healthy Daughter    Healthy Son     SOCIAL HISTORY: Social History   Socioeconomic History   Marital status: Married    Spouse name: Not on file   Number of children: Not on file   Years of education: Not on file   Highest education level: Not on file  Occupational History   Not on file  Tobacco Use    Smoking status: Former    Current packs/day: 0.00    Types: Cigarettes    Quit date: 04/01/1990    Years since quitting: 33.6    Passive exposure: Past   Smokeless tobacco: Never  Vaping Use   Vaping status: Never Used  Substance and Sexual Activity   Alcohol  use: Not Currently    Comment: 06/22/2019: per patient about 20-25 years ago   Drug use: No   Sexual activity: Never    Birth control/protection: Abstinence  Other Topics Concern   Not on file  Social History Narrative   Not on file   Social Drivers of Health   Financial Resource Strain: Low Risk  (01/21/2023)   Overall Financial Resource Strain (CARDIA)    Difficulty of Paying Living Expenses: Not hard at all  Food Insecurity: No Food Insecurity (01/21/2023)   Hunger Vital Sign    Worried About Running Out of Food in the Last Year: Never true    Ran Out of Food in the Last Year: Never true  Transportation Needs: No Transportation Needs (01/21/2023)   PRAPARE - Administrator, Civil Service (Medical): No    Lack of Transportation (Non-Medical): No  Physical Activity: Inactive (01/21/2023)   Exercise Vital Sign    Days of Exercise per Week: 0 days    Minutes of Exercise per Session: 0 min  Stress: No Stress Concern Present (01/21/2023)   [employer] of Occupational Health - Occupational Stress Questionnaire    Feeling of Stress : Not at all  Social Connections: Socially Integrated (01/21/2023)   Social Connection and Isolation Panel    Frequency of Communication with Friends and Family: More than three times a week    Frequency of Social Gatherings with Friends and Family: More than three times a week    Attends Religious Services: More than 4 times per year    Active Member of [employer] or Organizations: Yes    Attends Engineer, structural: More than 4 times per year    Marital Status: Married    MEDICATIONS:  Current Outpatient Medications  Medication Sig Dispense Refill   Accu-Chek  Softclix Lancets lancets Use as instructed 100 each 12   acetaminophen  (TYLENOL ) 325 MG tablet Take 2 tablets (650 mg total) by  mouth every 6 (six) hours as needed for moderate pain. 30 tablet 0   amLODipine  (NORVASC ) 10 MG tablet Take 1 tablet (10 mg total) by mouth daily. 90 tablet 3   Biotin 89999 MCG TABS Take 10,000 mcg by mouth daily.     Blood Glucose Monitoring Suppl (ACCU-CHEK GUIDE) w/Device KIT Use as advised 1 kit 0   Calcium  Carb-Cholecalciferol  (CALCIUM  600 + D PO) Take 1 tablet by mouth daily.     Continuous Blood Gluc Receiver (DEXCOM G6 RECEIVER) DEVI For sensor monitoring 1 each 0   Continuous Glucose Sensor (DEXCOM G6 SENSOR) MISC Use to monitor blood sugar, change after 10 days 9 each 3   Continuous Glucose Sensor (DEXCOM G7 SENSOR) MISC 1 Device by Does not apply route continuous. Change every 10 days 9 each 0   Continuous Glucose Transmitter (DEXCOM G6 TRANSMITTER) MISC 4 PER YEAR 1 each 3   cyclobenzaprine  (FLEXERIL ) 10 MG tablet as needed.     dapagliflozin  propanediol (FARXIGA ) 10 MG TABS tablet Take 1 tablet (10 mg total) by mouth daily before breakfast. 90 tablet 3   diclofenac  Sodium (VOLTAREN ) 1 % GEL Apply topically as needed.     ezetimibe -simvastatin  (VYTORIN ) 10-20 MG tablet 1/2 tablet Orally Once a day     fluticasone  (FLONASE ) 50 MCG/ACT nasal spray Place 2 sprays into both nostrils 2 (two) times daily as needed for allergies or rhinitis.     furosemide  (LASIX ) 40 MG tablet Take 2 tablets (80 mg total) by mouth daily. 180 tablet 0   Glucagon  (GVOKE HYPOPEN  1-PACK) 1 MG/0.2ML SOAJ Inject 1 mg into the skin as needed (low blood sugar with impaired consciousness). 0.4 mL 2   Glucosamine HCl-MSM (GLUCOSAMINE-MSM PO) Take 1 tablet by mouth daily.     glucose blood (ACCU-CHEK GUIDE TEST) test strip Use to test blood glucose before meals and at bedtime 300 each 12   glucose blood (ONETOUCH VERIO) test strip USE 1 STRIP TO CHECK GLUCOSE THREE TIMES DAILY 2 HOURS AFTER A  MEAL AND 1 THREE TIMES A WEEK UPON WAKING UP 250 each 2   HYDROcodone -acetaminophen  (NORCO/VICODIN) 5-325 MG tablet Take 1 tablet by mouth every 6 (six) hours as needed for severe pain (pain score 7-10). 12 tablet 0   Insulin  Pen Needle (BD PEN NEEDLE NANO U/F) 32G X 4 MM MISC USE 1  4 TIMES DAILY TO  INJECT  INSULIN  200 each 2   latanoprost (XALATAN) 0.005 % ophthalmic solution SMARTSIG:1 Drop(s) In Eye(s) Every Evening     leflunomide  (ARAVA ) 20 MG tablet Take 1 tablet (20 mg total) by mouth daily. 90 tablet 0   levothyroxine  (SYNTHROID ) 112 MCG tablet TAKE 1 TABLET BY MOUTH BEFORE BREAKFAST 90 tablet 3   linaclotide  (LINZESS ) 290 MCG CAPS capsule Take 1 capsule (290 mcg total) by mouth daily as needed. 90 capsule 3   Magnesium 100 MG CAPS Take 100 mg by mouth daily.     metFORMIN  (GLUCOPHAGE ) 1000 MG tablet Take 1 tablet (1,000 mg total) by mouth 2 (two) times daily with a meal. 180 tablet 3   methocarbamol  (ROBAXIN ) 500 MG tablet Take 1 tablet (500 mg total) by mouth every 8 (eight) hours as needed for muscle spasms. 30 tablet 3   montelukast  (SINGULAIR ) 10 MG tablet Take 1 tablet (10 mg total) by mouth at bedtime. 90 tablet 3   NOVOLOG  FLEXPEN 100 UNIT/ML FlexPen INJECT as per sliding scale as instructed maximum 20 units/ day SUBCUTANEOUSLY THREE TIMES DAILY BEFORE MEAL(S)  15 mL 4   nystatin -triamcinolone  ointment (MYCOLOG) Apply to feet twice daily for 4 weeks. 60 g 1   omeprazole  (PRILOSEC) 40 MG capsule Take 40 mg by mouth daily.     ondansetron  (ZOFRAN ) 4 MG tablet Take 1 tablet (4 mg total) by mouth every 8 (eight) hours as needed for nausea or vomiting. 20 tablet 0   OneTouch Delica Lancets 33G MISC USE TO CHECK BLOOD SUGAR UP TO THREE TIMES A DAY, 2 HOURS AFTER A MEAL AND UP TO 3 TIMES A WEEK UPON WAKING UP 300 each 0   potassium chloride  (KLOR-CON ) 10 MEQ tablet Take 1 tablet by mouth once daily 90 tablet 0   rosuvastatin  (CRESTOR ) 40 MG tablet Take 1 tablet (40 mg total) by mouth  daily. 90 tablet 3   traMADol  (ULTRAM ) 50 MG tablet Take 50 mg by mouth every 6 (six) hours as needed.     TRESIBA  FLEXTOUCH 200 UNIT/ML FlexTouch Pen INJECT 20 UNITS SUBCUTANEOUSLY IN THE MORNING 9 mL 4   valsartan  (DIOVAN ) 320 MG tablet Take 1 tablet (320 mg total) by mouth daily. 90 tablet 3   VYZULTA 0.024 % SOLN Apply 1 drop to eye daily.     No current facility-administered medications for this visit.    PHYSICAL EXAM: Vitals:   11/21/23 0920  BP: 138/68  Pulse: 73  Resp: 20  SpO2: 96%  Weight: 210 lb 6.4 oz (95.4 kg)  Height: 5' 5 (1.651 m)    Body mass index is 35.01 kg/m.  Wt Readings from Last 3 Encounters:  11/21/23 210 lb 6.4 oz (95.4 kg)  10/21/23 206 lb 6.4 oz (93.6 kg)  08/19/23 200 lb (90.7 kg)    General: Well developed, well nourished female in no apparent distress.  HEENT: AT/[place], no external lesions.  Eyes: Conjunctiva clear and no icterus. Neck: Neck supple  Lungs: Respirations not labored Neurologic: Alert, oriented, normal speech Extremities / Skin: Dry.  Psychiatric: Does not appear depressed or anxious  Diabetic Foot Exam - Simple   No data filed     LABS Reviewed Lab Results  Component Value Date   HGBA1C 6.3 (A) 11/21/2023   HGBA1C 6.2 (H) 08/08/2023   HGBA1C 6.0 07/16/2023   Lab Results  Component Value Date   FRUCTOSAMINE 194 07/30/2021   Lab Results  Component Value Date   CHOL 188 08/08/2023   HDL 75 08/08/2023   LDLCALC 89 08/08/2023   LDLDIRECT 62.0 05/18/2021   TRIG 137 08/08/2023   CHOLHDL 2.5 08/08/2023   Lab Results  Component Value Date   MICRALBCREAT 340 (H) 08/08/2023   MICRALBCREAT 333.3 (H) 02/16/2008   Lab Results  Component Value Date   CREATININE 0.69 10/21/2023   Lab Results  Component Value Date   GFR 82.90 07/16/2023    ASSESSMENT / PLAN  1. Diabetes mellitus type 2 with complications (HCC)   2. Acquired autoimmune hypothyroidism   3. Postmenopausal osteoporosis   4. Vitamin D  deficiency    5. Age-related osteoporosis without current pathological fracture    Diabetes Mellitus type 2, complicated by microalbuminuria. - Diabetic status / severity: Controlled.  Lab Results  Component Value Date   HGBA1C 6.3 (A) 11/21/2023    - Hemoglobin A1c goal : <6.5%  - Medications: See below.  Diabetes regimen:  Continue Tresiba  20 units daily in the morning. Continue metformin  1000 mg two times a day. Continue Farxiga  10 mg daily.    Novolog  sliding scale before meals and as needed.  Mild Sliding Scale Blood Glucose        Insulin  60-150                     None 151-200                   1 unit 201-250                   2 units 251-300                   4 units 301-350                   6 units 351-400                   8 units      >400                        9 units and call provider  - Home glucose testing: CGM Dexcom G and check as needed.  - Discussed/ Gave Hypoglycemia treatment plan.  Advised to use glucose tablet for correcting hypoglycemia.  She has Glucagon  Emergency Kit at home.  # Consult : not required at this time.   # Annual urine for microalbuminuria/ creatinine ratio, + microalbuminuria currently, continue ACE/ARB /valsartan .  On Farxiga .  Will recheck today. Last  Lab Results  Component Value Date   MICRALBCREAT 340 (H) 08/08/2023    # Foot check nightly.  # Annual dilated diabetic eye exams.   - Diet: Make healthy diabetic food choices.  - Life style / activity / exercise: Discussed.  2. Blood pressure  -  BP Readings from Last 1 Encounters:  11/21/23 138/68    - Control is in target.  - No change in current plans.   3. Lipid status / Hyperlipidemia - Last  Lab Results  Component Value Date   LDLCALC 89 08/08/2023   - Continue rosuvastatin  40 mg daily.  # Primary hypothyroidism -Continue levothyroxine  112 mcg daily.  Check clinically euthyroid today. -Check thyroid  function test.  # Osteoporosis -Patient had  osteoporotic/fragility fracture of L1 vertebra and right wrist in July 2024 and April 2025 respectively.  She has established osteoporosis.  Based on fragility fracture she also has a clinical osteoporosis. DEXA scan in January 2023 consistent with normal bone mineral density.  She has degenerative changes on the spine probably causing falsely elevation of bone mineral density.  DXA scan in May 2024 reviewed although T-score consistent with normal bone density, she has established osteoporosis based on fragility fractures.  Plan: - I would like to start her Reclast 5 mg IV infusion now and annually.  Will treat for at least 3 to 5 years, then consider for drug holiday.  Order placed. - Continue current dose of calcium /vitamin D /multivitamin daily. - Check vitamin D  level today.  Diagnoses and all orders for this visit:  Diabetes mellitus type 2 with complications (HCC) -     POCT glycosylated hemoglobin (Hb A1C) -     Microalbumin / creatinine urine ratio  Acquired autoimmune hypothyroidism -     T4, free -     TSH  Postmenopausal osteoporosis -     VITAMIN D  25 Hydroxy (Vit-D Deficiency, Fractures)  Vitamin D  deficiency -     VITAMIN D  25 Hydroxy (Vit-D Deficiency, Fractures)  Age-related osteoporosis without current pathological fracture  Other orders -     0.9 %  sodium chloride  infusion -     alteplase (CATHFLO ACTIVASE) injection 2 mg -     heparin lock flush 100 unit/mL -     sodium chloride  flush (NS) 0.9 % injection 10 mL -     sodium chloride  flush (NS) 0.9 % injection 3 mL -     anticoagulant sodium citrate solution 5 mL -     heparin lock flush 100 unit/mL -     acetaminophen  (TYLENOL ) tablet 650 mg -     diphenhydrAMINE (BENADRYL) capsule 25 mg -     zoledronic acid (RECLAST) injection 5 mg -     famotidine (PEPCID) 20 mg in sodium chloride  0.9 % 50 mL IVPB -     0.9 %  sodium chloride  infusion -     methylPREDNISolone  sodium succinate (SOLU-MEDROL ) 125 mg/2 mL  injection 125 mg -     diphenhydrAMINE (BENADRYL) injection 50 mg -     albuterol (VENTOLIN HFA) 108 (90 Base) MCG/ACT inhaler 2 puff -     EPINEPHrine  (EPI-PEN) injection 0.3 mg    DISPOSITION Follow up in clinic in 3 months suggested.    All questions answered and patient verbalized understanding of the plan.  [place] Jancie Kercher, MD [facility] [address] [place], [address] [place], [place] [address] Phone # [phone]  At least part of this note was generated using voice recognition software. Inadvertent word errors may have occurred, which were not recognized during the proofreading process.

## 2023-11-28 ENCOUNTER — Ambulatory Visit (INDEPENDENT_AMBULATORY_CARE_PROVIDER_SITE_OTHER): Admitting: Internal Medicine

## 2023-11-28 ENCOUNTER — Encounter: Payer: Self-pay | Admitting: Internal Medicine

## 2023-11-28 VITALS — BP 136/80 | HR 76 | Temp 98.3°F | Ht 65.0 in | Wt 207.8 lb

## 2023-11-28 DIAGNOSIS — J3089 Other allergic rhinitis: Secondary | ICD-10-CM | POA: Diagnosis not present

## 2023-11-28 DIAGNOSIS — M810 Age-related osteoporosis without current pathological fracture: Secondary | ICD-10-CM | POA: Diagnosis not present

## 2023-11-28 DIAGNOSIS — R809 Proteinuria, unspecified: Secondary | ICD-10-CM | POA: Insufficient documentation

## 2023-11-28 DIAGNOSIS — K219 Gastro-esophageal reflux disease without esophagitis: Secondary | ICD-10-CM | POA: Diagnosis not present

## 2023-11-28 DIAGNOSIS — M5416 Radiculopathy, lumbar region: Secondary | ICD-10-CM

## 2023-11-28 DIAGNOSIS — J309 Allergic rhinitis, unspecified: Secondary | ICD-10-CM | POA: Insufficient documentation

## 2023-11-28 DIAGNOSIS — K5909 Other constipation: Secondary | ICD-10-CM | POA: Diagnosis not present

## 2023-11-28 MED ORDER — OMEPRAZOLE 40 MG PO CPDR: 40.0000 mg | DELAYED_RELEASE_CAPSULE | Freq: Every day | ORAL | 3 refills | Status: AC

## 2023-11-28 MED ORDER — DICYCLOMINE HCL 20 MG PO TABS: 20.0000 mg | ORAL_TABLET | Freq: Four times a day (QID) | ORAL | 3 refills | Status: AC

## 2023-11-28 MED ORDER — FLUTICASONE PROPIONATE 50 MCG/ACT NA SUSP: 2.0000 | Freq: Two times a day (BID) | NASAL | 3 refills | Status: AC | PRN

## 2023-11-28 NOTE — Progress Notes (Signed)
 Subjective:   Patient ID: Marissa Roberts, female    DOB: [birth year], 78 y.o.   MRN: [id number]  Discussed the use of AI scribe software for clinical note transcription with the patient, who gave verbal consent to proceed.  History of Present Illness Marissa Roberts is a 78 year old female who presents for medication management and follow-up.  She experiences chronic back pain that has not improved despite previous treatments, including injections. The injections provided only temporary relief, and the pain worsened after the effects wore off. She manages the pain with Tylenol  and occasionally uses primidone, but avoids stronger medications like oxycodone  due to adverse effects.  She is scheduled to receive an annual infusion for her bones.  She has a history of gastrointestinal issues, including stomach pain initially thought to be related to her back issues. She takes dicyclomine  20 mg tablets, Linzess , and omeprazole  40 mg capsules for management of her symptoms. Her previous GI doctor prescribed these medications. She had an endoscopy and colonoscopy in 2020, with banding performed and polyps removed. She is due for another colonoscopy in 2027 unless symptoms change.  She experiences occasional throat drainage, which she suspects may be related to allergies. She previously used Flonase  and is considering using it again for symptom relief.  She has experienced weight fluctuations, having lost significant weight earlier in the year but has since regained some weight, which she feels has improved her overall strength and well-being. She attributes some of the weight changes to adjustments in her insulin  regimen.  No new chest pains, breathing troubles, diarrhea, or constipation. Linzess  is reported to be effective in managing her gastrointestinal symptoms.  Review of Systems  Constitutional:  Positive for activity change.  HENT: Negative.    Eyes: Negative.   Respiratory:  Negative  for cough, chest tightness and shortness of breath.   Cardiovascular:  Negative for chest pain, palpitations and leg swelling.  Gastrointestinal:  Negative for abdominal distention, abdominal pain, constipation, diarrhea, nausea and vomiting.  Musculoskeletal:  Positive for arthralgias and back pain.  Skin: Negative.   Neurological: Negative.   Psychiatric/Behavioral: Negative.      Objective:  Physical Exam Constitutional:      Appearance: She is well-developed.  HENT:     Head: Normocephalic and atraumatic.  Cardiovascular:     Rate and Rhythm: Normal rate and regular rhythm.  Pulmonary:     Effort: Pulmonary effort is normal. No respiratory distress.     Breath sounds: Normal breath sounds. No wheezing or rales.  Abdominal:     General: Bowel sounds are normal. There is no distension.     Palpations: Abdomen is soft.     Tenderness: There is no abdominal tenderness. There is no rebound.  Musculoskeletal:        General: Tenderness present.     Cervical back: Normal range of motion.  Skin:    General: Skin is warm and dry.  Neurological:     Mental Status: She is alert and oriented to person, place, and time.     Coordination: Coordination normal.     Vitals:   11/28/23 0832  BP: 136/80  Pulse: 76  Temp: 98.3 F (36.8 C)  TempSrc: Oral  SpO2: 97%  Weight: 207 lb 12.8 oz (94.3 kg)  Height: 5' 5 (1.651 m)   Assessment and Plan Assessment & Plan Chronic back pain with history of vertebral fracture and surgery   Chronic back pain continues despite previous vertebral fracture and surgery,  worsening after stopping pain relief injections. Pain is currently managed with Tylenol , which we will continue.  Osteoporosis   Osteoporosis is managed with annual infusion therapy to strengthen bones and reduce fracture risk. Most patients tolerate the infusion well, but Benadryl may be administered if a reaction occurs. Proceed with annual infusion therapy at [facility]  and administer Benadryl if needed.  Constipation, on chronic therapy   Constipation is effectively managed with Linzess . Prescriptions will now be managed by the current provider. Prescribe Linzess  and instruct the pharmacy to hold until requested.  Gastroesophageal reflux disease (GERD)   GERD is managed with omeprazole  40 mg. Prescriptions will be managed by the current provider. Prescribe omeprazole  40 mg and instruct the pharmacy to hold until requested.  Allergic rhinitis   Allergic rhinitis symptoms, including possible throat drainage, are noted. Flonase  will be refilled to manage symptoms, especially with upcoming fall allergies. Refill Flonase  and instruct her to use as needed.

## 2023-11-28 NOTE — Assessment & Plan Note (Signed)
 GERD is managed with omeprazole  40 mg. Prescriptions will be managed by the current provider. Prescribe omeprazole  40 mg and instruct the pharmacy to hold until requested.

## 2023-11-28 NOTE — Assessment & Plan Note (Signed)
 Reviewed reclast infusion upcoming as well as potential side effects.

## 2023-11-28 NOTE — Assessment & Plan Note (Signed)
 Constipation is effectively managed with Linzess . Prescriptions will now be managed by the current provider. Prescribe Linzess  and instruct the pharmacy to hold until requested.

## 2023-11-28 NOTE — Assessment & Plan Note (Signed)
 Allergic rhinitis symptoms, including possible throat drainage, are noted. Flonase  will be refilled to manage symptoms, especially with upcoming fall allergies. Refill Flonase  and instruct her to use as needed.

## 2023-11-28 NOTE — Patient Instructions (Signed)
 We have sent in the refills of all the medicines your old GI doctor had prescribed. When you are due for the colonoscopy we will get you in with a new GI doctor or sooner if you have any new or different problems.

## 2023-11-28 NOTE — Assessment & Plan Note (Signed)
 Chronic back pain continues despite previous vertebral fracture and surgery, worsening after stopping pain relief injections. Pain is currently managed with Tylenol , which we will continue.

## 2023-12-04 ENCOUNTER — Ambulatory Visit (INDEPENDENT_AMBULATORY_CARE_PROVIDER_SITE_OTHER)

## 2023-12-04 VITALS — BP 153/82 | HR 71 | Temp 97.4°F | Resp 18 | Ht 65.0 in | Wt 206.0 lb

## 2023-12-04 DIAGNOSIS — M8000XA Age-related osteoporosis with current pathological fracture, unspecified site, initial encounter for fracture: Secondary | ICD-10-CM

## 2023-12-04 MED ORDER — ACETAMINOPHEN 325 MG PO TABS
650.0000 mg | ORAL_TABLET | Freq: Once | ORAL | Status: AC
  Administered 2023-12-04: 650 mg via ORAL
  Filled 2023-12-04: qty 2

## 2023-12-04 MED ORDER — ZOLEDRONIC ACID 5 MG/100ML IV SOLN
5.0000 mg | Freq: Once | INTRAVENOUS | Status: AC
  Administered 2023-12-04: 5 mg via INTRAVENOUS
  Filled 2023-12-04: qty 100

## 2023-12-04 MED ORDER — DIPHENHYDRAMINE HCL 25 MG PO CAPS
25.0000 mg | ORAL_CAPSULE | Freq: Once | ORAL | Status: AC
  Administered 2023-12-04: 25 mg via ORAL
  Filled 2023-12-04: qty 1

## 2023-12-04 NOTE — Progress Notes (Signed)
 Diagnosis: Osteoporosis  Provider:  Lonna Coder MD  Procedure: IV Infusion  IV Type: Peripheral, IV Location: R Forearm  Reclast  (Zolendronic Acid), Dose: 5 mg  Infusion Start Time: 1027  Infusion Stop Time: 1056  Post Infusion IV Care: Observation period completed and Peripheral IV Discontinued  Discharge: Condition: Good, Destination: Home . AVS Provided  Performed by:  Loni Abdon, RN

## 2023-12-04 NOTE — Patient Instructions (Signed)

## 2023-12-06 DIAGNOSIS — E1165 Type 2 diabetes mellitus with hyperglycemia: Secondary | ICD-10-CM | POA: Diagnosis not present

## 2023-12-16 DIAGNOSIS — Z79899 Other long term (current) drug therapy: Secondary | ICD-10-CM | POA: Diagnosis not present

## 2023-12-16 DIAGNOSIS — M5416 Radiculopathy, lumbar region: Secondary | ICD-10-CM | POA: Diagnosis not present

## 2023-12-16 DIAGNOSIS — M48062 Spinal stenosis, lumbar region with neurogenic claudication: Secondary | ICD-10-CM | POA: Diagnosis not present

## 2023-12-22 ENCOUNTER — Other Ambulatory Visit: Payer: Self-pay | Admitting: Endocrinology

## 2023-12-22 DIAGNOSIS — E118 Type 2 diabetes mellitus with unspecified complications: Secondary | ICD-10-CM

## 2023-12-22 DIAGNOSIS — I10 Essential (primary) hypertension: Secondary | ICD-10-CM

## 2023-12-23 ENCOUNTER — Ambulatory Visit: Payer: Medicare Other | Admitting: Podiatry

## 2023-12-23 ENCOUNTER — Encounter: Payer: Self-pay | Admitting: Podiatry

## 2023-12-23 DIAGNOSIS — M79674 Pain in right toe(s): Secondary | ICD-10-CM | POA: Diagnosis not present

## 2023-12-23 DIAGNOSIS — B351 Tinea unguium: Secondary | ICD-10-CM | POA: Diagnosis not present

## 2023-12-23 DIAGNOSIS — L84 Corns and callosities: Secondary | ICD-10-CM

## 2023-12-23 DIAGNOSIS — E119 Type 2 diabetes mellitus without complications: Secondary | ICD-10-CM

## 2023-12-23 DIAGNOSIS — M79675 Pain in left toe(s): Secondary | ICD-10-CM

## 2023-12-23 DIAGNOSIS — Z794 Long term (current) use of insulin: Secondary | ICD-10-CM

## 2023-12-24 NOTE — Progress Notes (Signed)
 Subjective:  Patient ID: Marissa JONETTA Rama, female    DOB: 1946/02/24,  MRN: [id number]  Marissa Roberts presents to clinic today for corn(s) of both feet and painful mycotic toenails that are difficult to trim. Painful toenails interfere with ambulation. Aggravating factors include wearing enclosed shoe gear. Pain is relieved with periodic professional debridement. Painful corns are aggravated when weightbearing when wearing enclosed shoe gear. Pain is relieved with periodic professional debridement.  Chief Complaint  Patient presents with   Diabetes    DFC IDDM A1C 6.3. Toenail trim. LOV with PCP 11/28/23   New problem(s): None.   PCP is Rollene Almarie LABOR, MD.  Allergies  Allergen Reactions   Contrast Media [Iodinated Contrast Media]     Throat swells from ct contra   Adhesive [Tape]     No paper tape, causes blisters   Erythromycin Rash   Penicillins Rash    Did it involve swelling of the face/tongue/throat, SOB, or low BP? No Did it involve sudden or severe rash/hives, skin peeling, or any reaction on the inside of your mouth or nose? No Did you need to seek medical attention at a hospital or doctor's office? No When did it last happen?    Many years If all above answers are "NO", may proceed with cephalosporin use.     Review of Systems: Negative except as noted in the HPI.  Objective:  There were no vitals filed for this visit. [name] is a pleasant 78 y.o. female in NAD. AAO x 3.  Vascular Examination: Capillary refill time immediate b/l. Palpable pedal pulses. Pedal hair present b/l. No pain with calf compression b/l. Skin temperature gradient WNL b/l. No cyanosis or clubbing b/l. No ischemia or gangrene noted b/l. No edema noted b/l LE.  Neurological Examination: Sensation grossly intact b/l with 10 gram monofilament. Vibratory sensation intact b/l. Pt has subjective symptoms of neuropathy.  Dermatological Examination: Pedal skin warm and supple b/l.   No open wounds b/l. No interdigital macerations. Toenails 1-5 b/l elongated, thickened, discolored with subungual debris. +Tenderness with dorsal palpation of nailplates. Hyperkeratotic lesion dorsal PIPJ b/l 5th toes. Porokeratotic lesion(s) noted distal tip of right 4th toe.  Musculoskeletal Examination: Muscle strength 5/5 to all lower extremity muscle groups bilaterally. Hammertoe(s) 2-5 b/l.SABRA No pain, crepitus or joint limitation noted with ROM b/l LE.  Patient ambulates independently without assistive aids.  Radiographs: None  Assessment/Plan: 1. Pain due to onychomycosis of toenails of both feet   2. Corns   3. Type 2 diabetes mellitus without complication, with long-term current use of insulin  [facility])   Consent given for treatment. Patient examined. All patient's and/or POA's questions/concerns addressed on today's visit. Toenails 1-5 debrided in length and girth without incident. Corn(s) distal tip of right 4th toe and dorsal PIPJ of bilateral 5th toes pared with sharp debridement without incident. Continue foot and shoe inspections daily. Monitor blood glucose per PCP/Endocrinologist's recommendations.Continue soft, supportive shoe gear daily. Report any pedal injuries to medical professional. Call office if there are any questions/concerns.  Return in about 9 weeks (around 02/24/2024).  Delon LITTIE Merlin, DPM      [facility] LOCATION: [address], [place] [address]  Office [phone]   [facility] LOCATION: [address] [place], [place] [address] Office [phone]

## 2024-01-08 ENCOUNTER — Ambulatory Visit

## 2024-01-08 ENCOUNTER — Ambulatory Visit (INDEPENDENT_AMBULATORY_CARE_PROVIDER_SITE_OTHER)

## 2024-01-08 VITALS — Ht 65.0 in | Wt 206.0 lb

## 2024-01-08 DIAGNOSIS — Z0000 Encounter for general adult medical examination without abnormal findings: Secondary | ICD-10-CM

## 2024-01-08 NOTE — Patient Instructions (Addendum)
 [name],  Thank you for taking the time for your Medicare Wellness Visit. I appreciate your continued commitment to your health goals. Please review the care plan we discussed, and feel free to reach out if I can assist you further.  Medicare recommends these wellness visits once per year to help you and your care team stay ahead of potential health issues. These visits are designed to focus on prevention, allowing your provider to concentrate on managing your acute and chronic conditions during your regular appointments.  Please note that Annual Wellness Visits do not include a physical exam. Some assessments may be limited, especially if the visit was conducted virtually. If needed, we may recommend a separate in-person follow-up with your provider.  Ongoing Care Seeing your primary care provider every 3 to 6 months helps us  monitor your health and provide consistent, personalized care. Last office visit on 11/28/2023.  Next office visit 05/31/2024.  You are due for a tetanus,a flu and a covid vaccine and can get those done at your pharmacy.  Referrals If a referral was made during today's visit and you haven't received any updates within two weeks, please contact the referred provider directly to check on the status.  Recommended Screenings:  Health Maintenance  Topic Date Due   DTaP/Tdap/Td vaccine (3 - Tdap) 10/08/2023   Flu Shot  10/31/2023   COVID-19 Vaccine (8 - 2025-26 season) 12/01/2023   Medicare Annual Wellness Visit  01/21/2024   Eye exam for diabetics  01/16/2024   Complete foot exam   05/15/2024   Hemoglobin A1C  05/23/2024   Yearly kidney function blood test for diabetes  10/20/2024   Yearly kidney health urinalysis for diabetes  11/20/2024   Colon Cancer Screening  08/23/2025   Pneumococcal Vaccine for age over 73  Completed   DEXA scan (bone density measurement)  Completed   Hepatitis C Screening  Completed   Zoster (Shingles) Vaccine  Completed   Meningitis B  Vaccine  Aged Out   Breast Cancer Screening  Discontinued       01/08/2024    1:30 PM  Advanced Directives  Does Patient Have a Medical Advance Directive? Yes  Type of Estate agent of [place];Living will  Copy of Healthcare Power of Attorney in Chart? No - copy requested   Advance Care Planning is important because it: Ensures you receive medical care that aligns with your values, goals, and preferences. Provides guidance to your family and loved ones, reducing the emotional burden of decision-making during critical moments.  Vision: Annual vision screenings are recommended for early detection of glaucoma, cataracts, and diabetic retinopathy. These exams can also reveal signs of chronic conditions such as diabetes and high blood pressure.  Dental: Annual dental screenings help detect early signs of oral cancer, gum disease, and other conditions linked to overall health, including heart disease and diabetes.  Please see the attached documents for additional preventive care recommendations.  Managing Pain Without Opioids Opioids are strong medicines used to treat moderate to severe pain. For some people, especially those who have long-term (chronic) pain, opioids may not be the best choice for pain management due to: Side effects like nausea, constipation, and sleepiness. The risk of addiction (opioid use disorder). The longer you take opioids, the greater your risk of addiction. Pain that lasts for more than 3 months is called chronic pain. Managing chronic pain usually requires more than one approach and is often provided by a team of health care providers working together (  multidisciplinary approach). Pain management may be done at a pain management center or pain clinic. How to manage pain without the use of opioids Use non-opioid medicines Non-opioid medicines for pain may include: Over-the-counter or prescription non-steroidal anti-inflammatory drugs (NSAIDs).  These may be the first medicines used for pain. They work well for muscle and bone pain, and they reduce swelling. Acetaminophen . This over-the-counter medicine may work well for milder pain but not swelling. Antidepressants. These may be used to treat chronic pain. A certain type of antidepressant (tricyclics) is often used. These medicines are given in lower doses for pain than when used for depression. Anticonvulsants. These are usually used to treat seizures but may also reduce nerve (neuropathic) pain. Muscle relaxants. These relieve pain caused by sudden muscle tightening (spasms). You may also use a pain medicine that is applied to the skin as a patch, cream, or gel (topical analgesic), such as a numbing medicine. These may cause fewer side effects than medicines taken by mouth. Do certain therapies as directed Some therapies can help with pain management. They include: Physical therapy. You will do exercises to gain strength and flexibility. A physical therapist may teach you exercises to move and stretch parts of your body that are weak, stiff, or painful. You can learn these exercises at physical therapy visits and practice them at home. Physical therapy may also involve: Massage. Heat wraps or applying heat or cold to affected areas. Electrical signals that interrupt pain signals (transcutaneous electrical nerve stimulation, TENS). Weak lasers that reduce pain and swelling (low-level laser therapy). Signals from your body that help you learn to regulate pain (biofeedback). Occupational therapy. This helps you to learn ways to function at home and work with less pain. Recreational therapy. This involves trying new activities or hobbies, such as a physical activity or drawing. Mental health therapy, including: Cognitive behavioral therapy (CBT). This helps you learn coping skills for dealing with pain. Acceptance and commitment therapy (ACT) to change the way you think and react to  pain. Relaxation therapies, including muscle relaxation exercises and mindfulness-based stress reduction. Pain management counseling. This may be individual, family, or group counseling.  Receive medical treatments Medical treatments for pain management include: Nerve block injections. These may include a pain blocker and anti-inflammatory medicines. You may have injections: Near the spine to relieve chronic back or neck pain. Into joints to relieve back or joint pain. Into nerve areas that supply a painful area to relieve body pain. Into muscles (trigger point injections) to relieve some painful muscle conditions. A medical device placed near your spine to help block pain signals and relieve nerve pain or chronic back pain (spinal cord stimulation device). Acupuncture. Follow these instructions at home Medicines Take over-the-counter and prescription medicines only as told by your health care provider. If you are taking pain medicine, ask your health care providers about possible side effects to watch out for. Do not drive or use heavy machinery while taking prescription opioid pain medicine. Lifestyle  Do not use drugs or alcohol  to reduce pain. If you drink alcohol , limit how much you have to: 0-1 drink a day for women who are not pregnant. 0-2 drinks a day for men. Know how much alcohol  is in a drink. In the [place] one drink equals one 12 oz bottle of beer (355 mL), one 5 oz glass of wine (148 mL), or one 1 oz glass of hard liquor (44 mL). Do not use any products that contain nicotine or tobacco. These products include  cigarettes, chewing tobacco, and vaping devices, such as e-cigarettes. If you need help quitting, ask your health care provider. Eat a healthy diet and maintain a healthy weight. Poor diet and excess weight may make pain worse. Eat foods that are high in fiber. These include fresh fruits and vegetables, whole grains, and beans. Limit foods that are high in fat and  processed sugars, such as fried and sweet foods. Exercise regularly. Exercise lowers stress and may help relieve pain. Ask your health care provider what activities and exercises are safe for you. If your health care provider approves, join an exercise class that combines movement and stress reduction. Examples include yoga and tai chi. Get enough sleep. Lack of sleep may make pain worse. Lower stress as much as possible. Practice stress reduction techniques as told by your therapist. General instructions Work with all your pain management providers to find the treatments that work best for you. You are an important member of your pain management team. There are many things you can do to reduce pain on your own. Consider joining an online or in-person support group for people who have chronic pain. Keep all follow-up visits. This is important. Where to find more information You can find more information about managing pain without opioids from: American Academy of Pain Medicine: painmed.org Institute for Chronic Pain: instituteforchronicpain.org American Chronic Pain Association: theacpa.org Contact a health care provider if: You have side effects from pain medicine. Your pain gets worse or does not get better with treatments or home therapy. You are struggling with anxiety or depression. Summary Many types of pain can be managed without opioids. Chronic pain may respond better to pain management without opioids. Pain is best managed when you and a team of health care providers work together. Pain management without opioids may include non-opioid medicines, medical treatments, physical therapy, mental health therapy, and lifestyle changes. Tell your health care providers if your pain gets worse or is not being managed well enough. This information is not intended to replace advice given to you by your health care provider. Make sure you discuss any questions you have with your health care  provider. Document Revised: 06/28/2020 Document Reviewed: 06/28/2020 Elsevier Patient Education  2024 [employer].

## 2024-01-08 NOTE — Progress Notes (Signed)
 Subjective:   Marissa Roberts is a 78 y.o. who presents for a Medicare Wellness preventive visit.  As a reminder, Annual Wellness Visits don't include a physical exam, and some assessments may be limited, especially if this visit is performed virtually. We may recommend an in-person follow-up visit with your provider if needed.  Visit Complete: Virtual I connected with  Marissa Roberts on 01/08/24 by a audio enabled telemedicine application and verified that I am speaking with the correct person using two identifiers.  Patient Location: Home  Provider Location: Office/Clinic  I discussed the limitations of evaluation and management by telemedicine. The patient expressed understanding and agreed to proceed.  Vital Signs: Because this visit was a virtual/telehealth visit, some criteria may be missing or patient reported. Any vitals not documented were not able to be obtained and vitals that have been documented are patient reported.  VideoDeclined- This patient declined Librarian, academic. Therefore the visit was completed with audio only.  Persons Participating in Visit: Patient.  AWV Questionnaire: No: Patient Medicare AWV questionnaire was not completed prior to this visit.  Cardiac Risk Factors include: advanced age (>73men, >42 women);hypertension;diabetes mellitus;dyslipidemia     Objective:    Today's Vitals   01/08/24 1313  Weight: 206 lb (93.4 kg)  Height: 5' 5 (1.651 m)  PainSc: 2    Body mass index is 34.28 kg/m.     01/08/2024    1:30 PM 07/19/2023   10:18 PM 01/21/2023   10:18 AM 04/08/2022    1:42 PM 01/16/2022    9:34 AM 01/09/2021    9:43 AM 05/23/2020    1:17 PM  Advanced Directives  Does Patient Have a Medical Advance Directive? Yes No Yes Yes Yes Yes Yes  Type of Estate agent of [place];Living will  Healthcare Power of [place];Living will Healthcare Power of [employer] of  [place];Living will  Living will  Does patient want to make changes to medical advance directive?      No - Patient declined No - Patient declined  Copy of Healthcare Power of Attorney in Chart? No - copy requested  No - copy requested No - copy requested No - copy requested    Would patient like information on creating a medical advance directive?  No - Patient declined    No - Patient declined No - Patient declined    Current Medications (verified) Outpatient Encounter Medications as of 01/08/2024  Medication Sig   Accu-Chek Softclix Lancets lancets Use as instructed   acetaminophen  (TYLENOL ) 325 MG tablet Take 2 tablets (650 mg total) by mouth every 6 (six) hours as needed for moderate pain.   amLODipine  (NORVASC ) 10 MG tablet Take 1 tablet (10 mg total) by mouth daily.   Biotin 89999 MCG TABS Take 10,000 mcg by mouth daily.   Blood Glucose Monitoring Suppl (ACCU-CHEK GUIDE) w/Device KIT Use as advised   Calcium  Carb-Cholecalciferol  (CALCIUM  600 + D PO) Take 1 tablet by mouth daily.   Continuous Blood Gluc Receiver (DEXCOM G6 RECEIVER) DEVI For sensor monitoring   Continuous Glucose Sensor (DEXCOM G6 SENSOR) MISC Use to monitor blood sugar, change after 10 days   Continuous Glucose Sensor (DEXCOM G7 SENSOR) MISC 1 Device by Does not apply route continuous. Change every 10 days   Continuous Glucose Transmitter (DEXCOM G6 TRANSMITTER) MISC 4 PER YEAR   cyclobenzaprine  (FLEXERIL ) 10 MG tablet as needed.   dapagliflozin  propanediol (FARXIGA ) 10 MG TABS tablet Take 1 tablet (10  mg total) by mouth daily before breakfast.   diclofenac  Sodium (VOLTAREN ) 1 % GEL Apply topically as needed.   dicyclomine  (BENTYL ) 20 MG tablet Take 1 tablet (20 mg total) by mouth every 6 (six) hours. (Patient taking differently: Take 20 mg by mouth as needed for spasms.)   ezetimibe -simvastatin  (VYTORIN ) 10-20 MG tablet 1/2 tablet Orally Once a day   fluticasone  (FLONASE ) 50 MCG/ACT nasal spray Place 2 sprays into  both nostrils 2 (two) times daily as needed for allergies or rhinitis.   furosemide  (LASIX ) 40 MG tablet Take 2 tablets (80 mg total) by mouth daily.   Glucagon  (GVOKE HYPOPEN  1-PACK) 1 MG/0.2ML SOAJ Inject 1 mg into the skin as needed (low blood sugar with impaired consciousness).   Glucosamine HCl-MSM (GLUCOSAMINE-MSM PO) Take 1 tablet by mouth daily.   Insulin  Pen Needle (BD PEN NEEDLE NANO U/F) 32G X 4 MM MISC USE 1  4 TIMES DAILY TO  INJECT  INSULIN    latanoprost (XALATAN) 0.005 % ophthalmic solution SMARTSIG:1 Drop(s) In Eye(s) Every Evening   leflunomide  (ARAVA ) 20 MG tablet Take 1 tablet (20 mg total) by mouth daily.   levothyroxine  (SYNTHROID ) 112 MCG tablet TAKE 1 TABLET BY MOUTH BEFORE BREAKFAST   linaclotide  (LINZESS ) 290 MCG CAPS capsule Take 1 capsule (290 mcg total) by mouth daily as needed.   Magnesium 100 MG CAPS Take 100 mg by mouth daily.   metFORMIN  (GLUCOPHAGE ) 1000 MG tablet Take 1 tablet (1,000 mg total) by mouth 2 (two) times daily with a meal.   methocarbamol  (ROBAXIN ) 500 MG tablet Take 1 tablet (500 mg total) by mouth every 8 (eight) hours as needed for muscle spasms.   montelukast  (SINGULAIR ) 10 MG tablet Take 1 tablet (10 mg total) by mouth at bedtime.   NOVOLOG  FLEXPEN 100 UNIT/ML FlexPen INJECT as per sliding scale as instructed maximum 20 units/ day SUBCUTANEOUSLY THREE TIMES DAILY BEFORE MEAL(S)   omeprazole  (PRILOSEC) 40 MG capsule Take 1 capsule (40 mg total) by mouth daily.   ondansetron  (ZOFRAN ) 4 MG tablet Take 1 tablet (4 mg total) by mouth every 8 (eight) hours as needed for nausea or vomiting.   OneTouch Delica Lancets 33G MISC USE TO CHECK BLOOD SUGAR UP TO THREE TIMES A DAY, 2 HOURS AFTER A MEAL AND UP TO 3 TIMES A WEEK UPON WAKING UP   potassium chloride  (KLOR-CON ) 10 MEQ tablet Take 1 tablet by mouth once daily   rosuvastatin  (CRESTOR ) 40 MG tablet Take 1 tablet (40 mg total) by mouth daily.   traMADol  (ULTRAM ) 50 MG tablet Take 50 mg by mouth every 6  (six) hours as needed.   TRESIBA  FLEXTOUCH 200 UNIT/ML FlexTouch Pen INJECT 20 UNITS SUBCUTANEOUSLY IN THE MORNING   valsartan  (DIOVAN ) 320 MG tablet Take 1 tablet (320 mg total) by mouth daily.   glucose blood (ACCU-CHEK GUIDE TEST) test strip Use to test blood glucose before meals and at bedtime   glucose blood (ONETOUCH VERIO) test strip USE 1 STRIP TO CHECK GLUCOSE THREE TIMES DAILY 2 HOURS AFTER A MEAL AND 1 THREE TIMES A WEEK UPON WAKING UP   HYDROcodone -acetaminophen  (NORCO/VICODIN) 5-325 MG tablet Take 1 tablet by mouth every 6 (six) hours as needed for severe pain (pain score 7-10). (Patient not taking: Reported on 01/08/2024)   nystatin -triamcinolone  ointment (MYCOLOG) Apply to feet twice daily for 4 weeks. (Patient not taking: Reported on 01/08/2024)   VYZULTA 0.024 % SOLN Apply 1 drop to eye daily. (Patient not taking: Reported on 01/08/2024)   [  DISCONTINUED] Saxagliptin -Metformin  07-998 MG TB24 Take 1 tablet by mouth daily.   No facility-administered encounter medications on file as of 01/08/2024.    Allergies (verified) Contrast media [iodinated contrast media], Adhesive [tape], Erythromycin, and Penicillins   History: Past Medical History:  Diagnosis Date   Anemia    Arthritis    Bronchitis    hx of   Diabetes mellitus    type 2   Family history of adverse reaction to anesthesia    per patient, daughter threw up after a knee surgery   GERD (gastroesophageal reflux disease)    Hypercholesteremia    Hypertension    Hypothyroidism    Pneumonia    hx of   PONV (postoperative nausea and vomiting)    Thyroid  disease    Past Surgical History:  Procedure Laterality Date   ABDOMINAL HYSTERECTOMY     partial   BREAST SURGERY     lumpectomy left breast   carpel tunnel     COLONOSCOPY  07/2020   COLONOSCOPY W/ POLYPECTOMY     FOOT SURGERY     knee arhtroscopy     KNEE ARTHROPLASTY Left 05/2020   KYPHOPLASTY N/A 04/08/2022   Procedure: KYPHOPLASTY L1;  Surgeon: Burnetta Aures, MD;  Location: [facility] OR;  Service: Orthopedics;  Laterality: N/A;  [facility]   ORIF PATELLA Left 05/23/2020   Procedure: LEFT KNEE POLE PATELLA EXCISION;  Surgeon: Addie Cordella Hamilton, MD;  Location: [facility] OR;  Service: Orthopedics;  Laterality: Left;   SHOULDER ARTHROSCOPY WITH SUBACROMIAL DECOMPRESSION, ROTATOR CUFF REPAIR AND BICEP TENDON REPAIR Right 05/26/2012   Procedure: Right Shoulder Diagnostic Operative Arthroscopy, Subacromial Decompression, Biceps Tenodesis, Mini Open Rotator Cuff Repair;  Surgeon: Cordella Hamilton Addie, MD;  Location: [facility] OR;  Service: Orthopedics;  Laterality: Right;  Right Shoulder Diagnostic Operative Arthroscopy, Subacromial Decompression, Biceps Tenodesis, Mini Open Rotator Cuff Repair   TONSILLECTOMY     TOTAL KNEE ARTHROPLASTY Left 07/01/2019   Procedure: LEFT TOTAL KNEE ARTHROPLASTY;  Surgeon: Addie Cordella Hamilton, MD;  Location: [facility] OR;  Service: Orthopedics;  Laterality: Left;   UPPER GI ENDOSCOPY  07/2020   Family History  Problem Relation Age of Onset   Diabetes Mother    Heart disease Mother    Hyperlipidemia Mother    Diabetes Father    Diabetes Sister    Hypertension Sister    Heart disease Sister    Dementia Sister    Heart disease Brother    Healthy Daughter    Healthy Daughter    Healthy Daughter    Healthy Son    Social History   Socioeconomic History   Marital status: Married    Spouse name: Environmental consultant   Number of children: 4   Years of education: Not on file   Highest education level: Not on file  Occupational History   Occupation: RETIRED  Tobacco Use   Smoking status: Former    Current packs/day: 0.00    Types: Cigarettes    Quit date: 04/01/1990    Years since quitting: 33.7    Passive exposure: Past   Smokeless tobacco: Never  Vaping Use   Vaping status: Never Used  Substance and Sexual Activity   Alcohol  use: Not Currently    Comment: 06/22/2019: per patient about 20-25 years ago   Drug use: No   Sexual  activity: Never    Birth control/protection: Abstinence  Other Topics Concern   Not on file  Social History Narrative   Lives with husband/2025  Social Drivers of Corporate investment banker Strain: Low Risk  (01/08/2024)   Overall Financial Resource Strain (CARDIA)    Difficulty of Paying Living Expenses: Not hard at all  Food Insecurity: No Food Insecurity (01/08/2024)   Hunger Vital Sign    Worried About Running Out of Food in the Last Year: Never true    Ran Out of Food in the Last Year: Never true  Transportation Needs: No Transportation Needs (01/08/2024)   PRAPARE - Administrator, Civil Service (Medical): No    Lack of Transportation (Non-Medical): No  Physical Activity: Inactive (01/08/2024)   Exercise Vital Sign    Days of Exercise per Week: 0 days    Minutes of Exercise per Session: 0 min  Stress: No Stress Concern Present (01/08/2024)   [employer] of Occupational Health - Occupational Stress Questionnaire    Feeling of Stress: Not at all  Social Connections: Socially Integrated (01/08/2024)   Social Connection and Isolation Panel    Frequency of Communication with Friends and Family: More than three times a week    Frequency of Social Gatherings with Friends and Family: Once a week    Attends Religious Services: More than 4 times per year    Active Member of [employer] or Organizations: Yes    Attends Engineer, structural: More than 4 times per year    Marital Status: Married    Tobacco Counseling Counseling given: Not Answered    Clinical Intake:  Pre-visit preparation completed: Yes  Pain : 0-10 Pain Score: 2  Pain Type: Chronic pain Pain Location: Back Pain Descriptors / Indicators: Aching, Dull Pain Onset: More than a month ago Pain Frequency: Constant Pain Relieving Factors: Tylenol  and Tramadol   Pain Relieving Factors: Tylenol  and Tramadol   BMI - recorded: 34.28 Nutritional Status: BMI > 30  Obese Nutritional Risks:  None Diabetes: Yes CBG done?: No CBG resulted in Enter/ Edit results?: No Did pt. bring in CBG monitor from home?: No  Lab Results  Component Value Date   HGBA1C 6.3 (A) 11/21/2023   HGBA1C 6.2 (H) 08/08/2023   HGBA1C 6.0 07/16/2023     How often do you need to have someone help you when you read instructions, pamphlets, or other written materials from your doctor or pharmacy?: 1 - Never  Interpreter Needed?: No  Information entered by :: Gracee Ratterree, RMA   Activities of Daily Living     01/08/2024    1:17 PM 01/21/2023   10:20 AM  In your present state of health, do you have any difficulty performing the following activities:  Hearing? 0 0  Vision? 0 0  Difficulty concentrating or making decisions? 0 0  Walking or climbing stairs? 0 1  Dressing or bathing? 0 0  Doing errands, shopping? 0 0  Preparing Food and eating ? N N  Using the Toilet? N N  In the past six months, have you accidently leaked urine? N N  Do you have problems with loss of bowel control? N N  Managing your Medications? N N  Managing your Finances? N N  Housekeeping or managing your Housekeeping? N N    Patient Care Team: Rollene Almarie LABOR, MD as PCP - General (Internal Medicine) Abigail Maude POUR as Consulting Physician (Optometry) [place] as Consulting Physician (Obstetrics and Gynecology)  I have updated your Care Teams any recent Medical Services you may have received from other providers in the past year.  Assessment:   This is a routine wellness examination for Marissa.  Hearing/Vision screen Hearing Screening - Comments:: Denies hearing difficulties   Vision Screening - Comments:: Wears eyeglasses/ Dr. Abigail   Goals Addressed             This Visit's Progress    Patient Stated   On track    Stay as healthy and as independent as possible. Enjoy life, family and grandchildren.       Depression Screen     01/08/2024    1:32 PM 11/28/2023     8:31 AM 08/19/2023   10:45 AM 01/21/2023   10:19 AM 07/12/2022   11:25 AM 03/29/2022    9:31 AM 01/16/2022    9:39 AM  PHQ 2/9 Scores  PHQ - 2 Score 0 0 0 3 2 2  0  PHQ- 9 Score 1 3 0 5 10 2      Fall Risk     01/08/2024    1:30 PM 11/28/2023    8:31 AM 08/19/2023   10:45 AM 01/21/2023   10:19 AM 01/17/2023    7:37 PM  Fall Risk   Falls in the past year? 0 1 1 1 1   Number falls in past yr: 0 0 0 1 1  Injury with Fall? 0 1 1 1 1   Risk for fall due to :  Other (Comment)  History of fall(s);Impaired balance/gait;Orthopedic patient   Follow up Falls evaluation completed;Falls prevention discussed Falls evaluation completed Falls evaluation completed Education provided;Falls prevention discussed;Falls evaluation completed     MEDICARE RISK AT HOME:  Medicare Risk at Home Any stairs in or around the home?: No If so, are there any without handrails?: No Home free of loose throw rugs in walkways, pet beds, electrical cords, etc?: Yes Adequate lighting in your home to reduce risk of falls?: Yes Life alert?: No Use of a cane, walker or w/c?: Yes (uses cane sometimes) Grab bars in the bathroom?: Yes Shower chair or bench in shower?: Yes Elevated toilet seat or a handicapped toilet?: Yes  TIMED UP AND GO:  Was the test performed?  No  Cognitive Function: Declined/Normal: No cognitive concerns noted by patient or family. Patient alert, oriented, able to answer questions appropriately and recall recent events. No signs of memory loss or confusion.    01/21/2023   10:36 AM  MMSE - Mini Mental State Exam  Orientation to time 5  Orientation to Place 5  Registration 3  Attention/ Calculation 5  Recall 3  Language- name 2 objects 2  Language- repeat 1  Language- follow 3 step command 3  Language- read & follow direction 1  Write a sentence 1  Copy design 1  Total score 30        01/21/2023   10:20 AM 01/16/2022    9:36 AM  6CIT Screen  What Year? 0 points 0 points  What  month? 0 points 0 points  What time? 0 points 0 points  Count back from 20 0 points 0 points  Months in reverse 0 points 0 points  Repeat phrase 0 points 0 points  Total Score 0 points 0 points    Immunizations Immunization History  Administered Date(s) Administered   Fluad Quad(high Dose 65+) 01/02/2019, 12/31/2019, 12/24/2022   INFLUENZA, HIGH DOSE SEASONAL PF 01/07/2014, 01/27/2015, 01/02/2016, 01/13/2017, 01/16/2018   Influenza Split 01/03/2011   Influenza Whole 04/01/2005, 12/18/2009   Influenza-Unspecified 11/30/2012, 12/25/2012, 01/03/2021, 01/08/2022   PFIZER Comirnaty(Gray Top)Covid-19 Tri-Sucrose Vaccine 12/24/2022  PFIZER(Purple Top)SARS-COV-2 Vaccination 05/28/2019, 06/23/2019, 01/15/2020, 07/03/2020, 01/03/2021   PNEUMOCOCCAL CONJUGATE-20 05/10/2021   Pfizer(Comirnaty)Fall Seasonal Vaccine 12 years and older 01/08/2022   Pneumococcal Conjugate-13 09/12/2016, 01/02/2019   Pneumococcal Polysaccharide-23 07/15/2014, 12/27/2019   Respiratory Syncytial Virus Vaccine,Recomb Aduvanted(Arexvy) 01/08/2022   Td 04/01/2001, 10/07/2013   Zoster Recombinant(Shingrix) 01/09/2021, 03/12/2021    Screening Tests Health Maintenance  Topic Date Due   DTaP/Tdap/Td (3 - Tdap) 10/08/2023   Influenza Vaccine  10/31/2023   COVID-19 Vaccine (8 - 2025-26 season) 12/01/2023   Medicare Annual Wellness (AWV)  01/21/2024   OPHTHALMOLOGY EXAM  01/16/2024   FOOT EXAM  05/15/2024   HEMOGLOBIN A1C  05/23/2024   Diabetic kidney evaluation - eGFR measurement  10/20/2024   Diabetic kidney evaluation - Urine ACR  11/20/2024   Colonoscopy  08/23/2025   Pneumococcal Vaccine: 50+ Years  Completed   DEXA SCAN  Completed   Hepatitis C Screening  Completed   Zoster Vaccines- Shingrix  Completed   Meningococcal B Vaccine  Aged Out   Mammogram  Discontinued    Health Maintenance Items Addressed: Vaccines Due: flu and tdap, See Nurse Notes at the end of this note  Additional Screening:  Vision  Screening: Recommended annual ophthalmology exams for early detection of glaucoma and other disorders of the eye. Is the patient up to date with their annual eye exam?  Yes  Who is the provider or what is the name of the office in which the patient attends annual eye exams? Dr. Abigail  Dental Screening: Recommended annual dental exams for proper oral hygiene  Community Resource Referral / Chronic Care Management: CRR required this visit?  No   CCM required this visit?  No   Plan:    I have personally reviewed and noted the following in the patient's chart:   Medical and social history Use of alcohol , tobacco or illicit drugs  Current medications and supplements including opioid prescriptions. Patient is currently taking opioid prescriptions. Information provided to patient regarding non-opioid alternatives. Patient advised to discuss non-opioid treatment plan with their provider. Functional ability and status Nutritional status Physical activity Advanced directives List of other physicians Hospitalizations, surgeries, and ER visits in previous 12 months Vitals Screenings to include cognitive, depression, and falls Referrals and appointments  In addition, I have reviewed and discussed with patient certain preventive protocols, quality metrics, and best practice recommendations. A written personalized care plan for preventive services as well as general preventive health recommendations were provided to patient.   Tinslee Klare L Jayce Boyko, CMA   01/08/2024   After Visit Summary: (MyChart) Due to this being a telephonic visit, the after visit summary with patients personalized plan was offered to patient via MyChart   Notes: Patient is due for a tdap,a flu and a covid vaccine.  She had no other concerns to address today.

## 2024-01-17 ENCOUNTER — Other Ambulatory Visit: Payer: Self-pay | Admitting: Physician Assistant

## 2024-01-17 DIAGNOSIS — Z79899 Other long term (current) drug therapy: Secondary | ICD-10-CM

## 2024-01-19 NOTE — Telephone Encounter (Addendum)
 Last Fill: 10/21/2023  Labs: 10/21/2023 CMP WNL Hemoglobin is slightly low-11.4. rest of CBC WNL.    Next Visit: 03/18/2024  Last Visit: 10/21/2023  DX:  Rheumatoid arthritis involving multiple sites with positive rheumatoid factor (HCC)   Current Dose per office note 10/21/2023: Arava  20 mg 1 tablet by mouth daily.   Contacted the patient and advised she is almost due for labs. Patient states she will come to the office to get lab work done as soon as she can.   Okay to refill Arava  ?

## 2024-01-22 ENCOUNTER — Other Ambulatory Visit: Payer: Self-pay | Admitting: *Deleted

## 2024-01-22 DIAGNOSIS — Z79899 Other long term (current) drug therapy: Secondary | ICD-10-CM

## 2024-01-22 LAB — CBC WITH DIFFERENTIAL/PLATELET
Absolute Lymphocytes: 3094 {cells}/uL (ref 850–3900)
Absolute Monocytes: 728 {cells}/uL (ref 200–950)
Basophils Absolute: 46 {cells}/uL (ref 0–200)
Basophils Relative: 0.5 %
Eosinophils Absolute: 137 {cells}/uL (ref 15–500)
Eosinophils Relative: 1.5 %
HCT: 35.3 % (ref 35.0–45.0)
Hemoglobin: 11.4 g/dL — ABNORMAL LOW (ref 11.7–15.5)
MCH: 28.1 pg (ref 27.0–33.0)
MCHC: 32.3 g/dL (ref 32.0–36.0)
MCV: 87.2 fL (ref 80.0–100.0)
MPV: 11 fL (ref 7.5–12.5)
Monocytes Relative: 8 %
Neutro Abs: 5096 {cells}/uL (ref 1500–7800)
Neutrophils Relative %: 56 %
Platelets: 277 Thousand/uL (ref 140–400)
RBC: 4.05 Million/uL (ref 3.80–5.10)
RDW: 15 % (ref 11.0–15.0)
Total Lymphocyte: 34 %
WBC: 9.1 Thousand/uL (ref 3.8–10.8)

## 2024-01-22 LAB — COMPREHENSIVE METABOLIC PANEL WITH GFR
AG Ratio: 1.6 (calc) (ref 1.0–2.5)
ALT: 20 U/L (ref 6–29)
AST: 17 U/L (ref 10–35)
Albumin: 4.3 g/dL (ref 3.6–5.1)
Alkaline phosphatase (APISO): 56 U/L (ref 37–153)
BUN: 14 mg/dL (ref 7–25)
CO2: 25 mmol/L (ref 20–32)
Calcium: 9.6 mg/dL (ref 8.6–10.4)
Chloride: 106 mmol/L (ref 98–110)
Creat: 0.84 mg/dL (ref 0.60–1.00)
Globulin: 2.7 g/dL (ref 1.9–3.7)
Glucose, Bld: 94 mg/dL (ref 65–99)
Potassium: 4.7 mmol/L (ref 3.5–5.3)
Sodium: 141 mmol/L (ref 135–146)
Total Bilirubin: 0.3 mg/dL (ref 0.2–1.2)
Total Protein: 7 g/dL (ref 6.1–8.1)
eGFR: 72 mL/min/1.73m2 (ref >=60)

## 2024-01-23 DIAGNOSIS — E119 Type 2 diabetes mellitus without complications: Secondary | ICD-10-CM | POA: Diagnosis not present

## 2024-01-25 ENCOUNTER — Ambulatory Visit: Payer: Self-pay | Admitting: Physician Assistant

## 2024-01-25 NOTE — Progress Notes (Signed)
 Hemoglobin remains low but stable-11.4, rest of CBC WNL  CMP WNL

## 2024-02-12 ENCOUNTER — Encounter: Payer: Self-pay | Admitting: Endocrinology

## 2024-02-12 ENCOUNTER — Ambulatory Visit: Payer: Self-pay | Admitting: Endocrinology

## 2024-02-12 ENCOUNTER — Ambulatory Visit (INDEPENDENT_AMBULATORY_CARE_PROVIDER_SITE_OTHER): Admitting: Endocrinology

## 2024-02-12 VITALS — BP 112/68 | HR 77 | Resp 16 | Ht 65.0 in | Wt 206.8 lb

## 2024-02-12 DIAGNOSIS — E063 Autoimmune thyroiditis: Secondary | ICD-10-CM

## 2024-02-12 DIAGNOSIS — Z794 Long term (current) use of insulin: Secondary | ICD-10-CM

## 2024-02-12 DIAGNOSIS — M810 Age-related osteoporosis without current pathological fracture: Secondary | ICD-10-CM

## 2024-02-12 DIAGNOSIS — E1165 Type 2 diabetes mellitus with hyperglycemia: Secondary | ICD-10-CM

## 2024-02-12 LAB — POCT GLYCOSYLATED HEMOGLOBIN (HGB A1C): Hemoglobin A1C: 6.6 % — AB (ref 4.0–5.6)

## 2024-02-12 NOTE — Progress Notes (Signed)
 Outpatient Endocrinology Note Shanece Cochrane, MD  02/12/24  Patient's Name: Marissa Roberts    DOB: February 24, 1946    MRN: [id number]                                                    REASON OF VISIT: Follow up of type 2 diabetes mellitus / Hypothyroidism /osteoporosis  PCP: Rollene Almarie LABOR, MD  HISTORY OF PRESENT ILLNESS:   Marissa Roberts is a 78 y.o. old female with past medical history listed below, is here for follow up for type 2 diabetes mellitus / osteoporosis /hypothyroidism.   Pertinent Diabetes History: Patient was diagnosed with type 2 diabetes mellitus in 2005.  Patient has controlled type 2 diabetes mellitus.  Chronic Diabetes Complications : Retinopathy: no. Last ophthalmology exam was done on annuallyy, following with ophthalmology regularly.  Nephropathy: yes, microalbuminuria present., on ACE/ARB /valsartan . Peripheral neuropathy: no Coronary artery disease: no Stroke: on  Relevant comorbidities and cardiovascular risk factors: Obesity: yes Body mass index is 34.41 kg/m.  Hypertension: Yes  Hyperlipidemia : Yes, on statin   Current / Home Diabetic regimen includes: Tresiba  20 units in the morning.  Novolog  occasionally 1 - 5 units per sliding scale. Very seldom.  Metformin  1000 mg 2 times a day. Farxiga  10 mg daily.  Prior diabetic medications: Kombiglyze, Byetta .  V-Go pump in the past, A stopped due to cost, skin irritation and discomfort.  She had taken Lantus  in the past.  Glycemic data:    CONTINUOUS GLUCOSE MONITORING SYSTEM (CGMS) INTERPRETATION: At today's visit, we reviewed CGM downloads. The full report is scanned in the media. Reviewing the CGM trends, blood glucose are as follows:  Dexcom  CGM-  Sensor Download (Sensor download was reviewed and summarized below.) Dates: August 9 to November 21, 2023, 14 days  Glucose Management Indicator: 6.6%    Previous :    Interpretation: Mostly acceptable blood sugar with occasional  hyperglycemia with blood sugar up to 200-250 range especially with lunch and some time with supper.  Blood sugar other times mostly acceptable.  No hypoglycemia.  Hypoglycemia: Patient has no hypoglycemic episodes. Patient has hypoglycemia awareness.  Factors modifying glucose control: 1.  Diabetic diet assessment: 3 meals a day.  2.  Staying active or exercising: No formal exercise.  3.  Medication compliance: compliant all of the time.  # Primary hypothyroidism -She has been hypothyroid for 40+ years.  She has been taking levothyroxine  since her diagnosis.  She is currently taking levothyroxine  112 mcg daily.  # Osteoporosis: Patient had L1 compression fracture after a fall in January 2024 and underwent kyphoplasty in July 2024. Patient had right wrist fracture after a mechanical fall from standing height, into the rock in mid April 2025.  Fractures concerning for osteoporotic/fragility fractures.  Patient has rheumatoid arthritis, follow-up with rheumatology.  Has osteoarthritis and degenerative changes on the spine.  Patient had multiple steroid injections in the past into the back.  Patient had normal parathyroid  hormone and calcium  levels in the past.  Patient has been taking calcium /vitamin D  supplement daily along with multivitamin 1 tablet daily.  Does not recall the dose.  Problem list in 2010 mentioned about having osteoporosis.  Patient does not recall she was diagnosed with osteoporosis.  Patient denies taking any antiresorptive therapy for the bone health or osteoporosis.  Last  time her DEXA scan was done by OB/GYN in January 2023, at [facility]. DEXA scan last done in January 2023 reports reviewed consistent normal bone mineral density.  DEXA scan completed on May 16 and 20, 2025 at [facility] : Overall normal bone mineral density however she has established osteoporosis, T-score at L2-L4 -0.1, T-score left femoral neck -0.9 and  right femoral neck -0.4.  Reviewed report scanned into media.  Reclast  started and first dose was in September 2025.  Interval history  CGM data as reviewed above.  Occasional postprandial hyperglycemia especially with lunch and some time with supper.  Hemoglobin A1c acceptable at 6.6%.  Patient reports lately she has been improved over diet and eating more.  She has not been using NovoLog  lately.  Diabetes regimen as reviewed and noted above.  Patient received Reclast  infusion in September.  She has no other fall and fracture.  She has been taking calcium  and and vitamin D  supplement.  She has been taking levothyroxine  112 mcg daily.  No hypo and hyperthyroid symptoms.  She complains of left angle of jaw pain, discussed that unlikely to be related to Reclast  infusion.  No other complaints today.   REVIEW OF SYSTEMS As per history of present illness.   PAST MEDICAL HISTORY: Past Medical History:  Diagnosis Date   Anemia    Arthritis    Bronchitis    hx of   Diabetes mellitus    type 2   Family history of adverse reaction to anesthesia    per patient, daughter threw up after a knee surgery   GERD (gastroesophageal reflux disease)    Hypercholesteremia    Hypertension    Hypothyroidism    Pneumonia    hx of   PONV (postoperative nausea and vomiting)    Thyroid  disease     PAST SURGICAL HISTORY: Past Surgical History:  Procedure Laterality Date   ABDOMINAL HYSTERECTOMY     partial   BREAST SURGERY     lumpectomy left breast   carpel tunnel     COLONOSCOPY  07/2020   COLONOSCOPY W/ POLYPECTOMY     FOOT SURGERY     knee arhtroscopy     KNEE ARTHROPLASTY Left 05/2020   KYPHOPLASTY N/A 04/08/2022   Procedure: KYPHOPLASTY L1;  Surgeon: Burnetta Aures, MD;  Location: [facility] OR;  Service: Orthopedics;  Laterality: N/A;  [facility] with [facility]   ORIF PATELLA Left 05/23/2020   Procedure: LEFT KNEE POLE PATELLA EXCISION;  Surgeon: Addie Cordella Hamilton, MD;  Location[facility] OR;   Service: Orthopedics;  Laterality: Left;   SHOULDER ARTHROSCOPY WITH SUBACROMIAL DECOMPRESSION, ROTATOR CUFF REPAIR AND BICEP TENDON REPAIR Right 05/26/2012   Procedure: Right Shoulder Diagnostic Operative Arthroscopy, Subacromial Decompression, Biceps Tenodesis, Mini Open Rotator Cuff Repair;  Surgeon: Cordella Hamilton Addie, MD;  Location: [facility] OR;  Service: Orthopedics;  Laterality: Right;  Right Shoulder Diagnostic Operative Arthroscopy, Subacromial Decompression, Biceps Tenodesis, Mini Open Rotator Cuff Repair   TONSILLECTOMY     TOTAL KNEE ARTHROPLASTY Left 07/01/2019   Procedure: LEFT TOTAL KNEE ARTHROPLASTY;  Surgeon: Addie Cordella Hamilton, MD;  Location: [facility] OR;  Service: Orthopedics;  Laterality: Left;   UPPER GI ENDOSCOPY  07/2020    ALLERGIES: Allergies  Allergen Reactions   Contrast Media [Iodinated Contrast Media]     Throat swells from ct contra   Adhesive [Tape]     No paper tape, causes blisters   Erythromycin Rash   Penicillins Rash    Did  it involve swelling of the face/tongue/throat, SOB, or low BP? No Did it involve sudden or severe rash/hives, skin peeling, or any reaction on the inside of your mouth or nose? No Did you need to seek medical attention at a hospital or doctor's office? No When did it last happen?    Many years If all above answers are "NO", may proceed with cephalosporin use.     FAMILY HISTORY:  Family History  Problem Relation Age of Onset   Diabetes Mother    Heart disease Mother    Hyperlipidemia Mother    Diabetes Father    Diabetes Sister    Hypertension Sister    Heart disease Sister    Dementia Sister    Heart disease Brother    Healthy Daughter    Healthy Daughter    Healthy Daughter    Healthy Son     SOCIAL HISTORY: Social History   Socioeconomic History   Marital status: Married    Spouse name: Environmental Consultant   Number of children: 4   Years of education: Not on file   Highest education level: Not on file  Occupational History    Occupation: RETIRED  Tobacco Use   Smoking status: Former    Current packs/day: 0.00    Types: Cigarettes    Quit date: 04/01/1990    Years since quitting: 33.8    Passive exposure: Past   Smokeless tobacco: Never  Vaping Use   Vaping status: Never Used  Substance and Sexual Activity   Alcohol  use: Not Currently    Comment: 06/22/2019: per patient about 20-25 years ago   Drug use: No   Sexual activity: Never    Birth control/protection: Abstinence  Other Topics Concern   Not on file  Social History Narrative   Lives with husband/2025   Social Drivers of Health   Financial Resource Strain: Low Risk  (01/08/2024)   Overall Financial Resource Strain (CARDIA)    Difficulty of Paying Living Expenses: Not hard at all  Food Insecurity: No Food Insecurity (01/08/2024)   Hunger Vital Sign    Worried About Running Out of Food in the Last Year: Never true    Ran Out of Food in the Last Year: Never true  Transportation Needs: No Transportation Needs (01/08/2024)   PRAPARE - Administrator, Civil Service (Medical): No    Lack of Transportation (Non-Medical): No  Physical Activity: Inactive (01/08/2024)   Exercise Vital Sign    Days of Exercise per Week: 0 days    Minutes of Exercise per Session: 0 min  Stress: No Stress Concern Present (01/08/2024)   [employer] of Occupational Health - Occupational Stress Questionnaire    Feeling of Stress: Not at all  Social Connections: Socially Integrated (01/08/2024)   Social Connection and Isolation Panel    Frequency of Communication with Friends and Family: More than three times a week    Frequency of Social Gatherings with Friends and Family: Once a week    Attends Religious Services: More than 4 times per year    Active Member of [employer] or Organizations: Yes    Attends Engineer, Structural: More than 4 times per year    Marital Status: Married    MEDICATIONS:  Current Outpatient Medications  Medication Sig Dispense  Refill   Accu-Chek Softclix Lancets lancets Use as instructed 100 each 12   acetaminophen  (TYLENOL ) 325 MG tablet Take 2 tablets (650 mg total) by mouth every 6 (six) hours as  needed for moderate pain. 30 tablet 0   amLODipine  (NORVASC ) 10 MG tablet Take 1 tablet (10 mg total) by mouth daily. 90 tablet 3   Biotin 89999 MCG TABS Take 10,000 mcg by mouth daily.     Blood Glucose Monitoring Suppl (ACCU-CHEK GUIDE) w/Device KIT Use as advised 1 kit 0   Calcium  Carb-Cholecalciferol  (CALCIUM  600 + D PO) Take 1 tablet by mouth daily.     Continuous Glucose Sensor (DEXCOM G7 SENSOR) MISC 1 Device by Does not apply route continuous. Change every 10 days 9 each 0   cyclobenzaprine  (FLEXERIL ) 10 MG tablet as needed.     dapagliflozin  propanediol (FARXIGA ) 10 MG TABS tablet Take 1 tablet (10 mg total) by mouth daily before breakfast. 90 tablet 3   diclofenac  Sodium (VOLTAREN ) 1 % GEL Apply topically as needed.     dicyclomine  (BENTYL ) 20 MG tablet Take 1 tablet (20 mg total) by mouth every 6 (six) hours. (Patient taking differently: Take 20 mg by mouth as needed for spasms.) 90 tablet 3   ezetimibe -simvastatin  (VYTORIN ) 10-20 MG tablet 1/2 tablet Orally Once a day     fluticasone  (FLONASE ) 50 MCG/ACT nasal spray Place 2 sprays into both nostrils 2 (two) times daily as needed for allergies or rhinitis. 48 g 3   furosemide  (LASIX ) 40 MG tablet Take 2 tablets (80 mg total) by mouth daily. 180 tablet 0   Glucagon  (GVOKE HYPOPEN  1-PACK) 1 MG/0.2ML SOAJ Inject 1 mg into the skin as needed (low blood sugar with impaired consciousness). 0.4 mL 2   Glucosamine HCl-MSM (GLUCOSAMINE-MSM PO) Take 1 tablet by mouth daily.     glucose blood (ACCU-CHEK GUIDE TEST) test strip Use to test blood glucose before meals and at bedtime 300 each 12   glucose blood (ONETOUCH VERIO) test strip USE 1 STRIP TO CHECK GLUCOSE THREE TIMES DAILY 2 HOURS AFTER A MEAL AND 1 THREE TIMES A WEEK UPON WAKING UP 250 each 2    HYDROcodone -acetaminophen  (NORCO/VICODIN) 5-325 MG tablet Take 1 tablet by mouth every 6 (six) hours as needed for severe pain (pain score 7-10). 12 tablet 0   Insulin  Pen Needle (BD PEN NEEDLE NANO U/F) 32G X 4 MM MISC USE 1  4 TIMES DAILY TO  INJECT  INSULIN  200 each 2   latanoprost (XALATAN) 0.005 % ophthalmic solution SMARTSIG:1 Drop(s) In Eye(s) Every Evening     leflunomide  (ARAVA ) 20 MG tablet Take 1 tablet by mouth once daily 30 tablet 0   levothyroxine  (SYNTHROID ) 112 MCG tablet TAKE 1 TABLET BY MOUTH BEFORE BREAKFAST 90 tablet 3   linaclotide  (LINZESS ) 290 MCG CAPS capsule Take 1 capsule (290 mcg total) by mouth daily as needed. 90 capsule 3   Magnesium 100 MG CAPS Take 100 mg by mouth daily.     metFORMIN  (GLUCOPHAGE ) 1000 MG tablet Take 1 tablet (1,000 mg total) by mouth 2 (two) times daily with a meal. 180 tablet 3   methocarbamol  (ROBAXIN ) 500 MG tablet Take 1 tablet (500 mg total) by mouth every 8 (eight) hours as needed for muscle spasms. 30 tablet 3   montelukast  (SINGULAIR ) 10 MG tablet Take 1 tablet (10 mg total) by mouth at bedtime. 90 tablet 3   NOVOLOG  FLEXPEN 100 UNIT/ML FlexPen INJECT as per sliding scale as instructed maximum 20 units/ day SUBCUTANEOUSLY THREE TIMES DAILY BEFORE MEAL(S) 15 mL 4   nystatin -triamcinolone  ointment (MYCOLOG) Apply to feet twice daily for 4 weeks. 60 g 1   omeprazole  (PRILOSEC) 40 MG  capsule Take 1 capsule (40 mg total) by mouth daily. 90 capsule 3   ondansetron  (ZOFRAN ) 4 MG tablet Take 1 tablet (4 mg total) by mouth every 8 (eight) hours as needed for nausea or vomiting. 20 tablet 0   OneTouch Delica Lancets 33G MISC USE TO CHECK BLOOD SUGAR UP TO THREE TIMES A DAY, 2 HOURS AFTER A MEAL AND UP TO 3 TIMES A WEEK UPON WAKING UP 300 each 0   potassium chloride  (KLOR-CON ) 10 MEQ tablet Take 1 tablet by mouth once daily 90 tablet 3   rosuvastatin  (CRESTOR ) 40 MG tablet Take 1 tablet (40 mg total) by mouth daily. 90 tablet 3   traMADol  (ULTRAM ) 50 MG  tablet Take 50 mg by mouth every 6 (six) hours as needed.     TRESIBA  FLEXTOUCH 200 UNIT/ML FlexTouch Pen INJECT 20 UNITS SUBCUTANEOUSLY IN THE MORNING 9 mL 4   valsartan  (DIOVAN ) 320 MG tablet Take 1 tablet (320 mg total) by mouth daily. 90 tablet 3   VYZULTA 0.024 % SOLN Apply 1 drop to eye daily.     No current facility-administered medications for this visit.    PHYSICAL EXAM: Vitals:   02/12/24 0946  BP: 112/68  Pulse: 77  Resp: 16  SpO2: 97%  Weight: 206 lb 12.8 oz (93.8 kg)  Height: 5' 5 (1.651 m)    Body mass index is 34.41 kg/m.  Wt Readings from Last 3 Encounters:  02/12/24 206 lb 12.8 oz (93.8 kg)  01/08/24 206 lb (93.4 kg)  12/04/23 206 lb (93.4 kg)    General: Well developed, well nourished female in no apparent distress.  HEENT: AT/[place], no external lesions.  Eyes: Conjunctiva clear and no icterus. Neck: Neck supple  Lungs: Respirations not labored Neurologic: Alert, oriented, normal speech Extremities / Skin: Dry.  Psychiatric: Does not appear depressed or anxious  Diabetic Foot Exam - Simple   No data filed     LABS Reviewed Lab Results  Component Value Date   HGBA1C 6.6 (A) 02/12/2024   HGBA1C 6.3 (A) 11/21/2023   HGBA1C 6.2 (H) 08/08/2023   Lab Results  Component Value Date   FRUCTOSAMINE 194 07/30/2021   Lab Results  Component Value Date   CHOL 188 08/08/2023   HDL 75 08/08/2023   LDLCALC 89 08/08/2023   LDLDIRECT 62.0 05/18/2021   TRIG 137 08/08/2023   CHOLHDL 2.5 08/08/2023   Lab Results  Component Value Date   MICRALBCREAT 258 (H) 11/21/2023   MICRALBCREAT 340 (H) 08/08/2023   Lab Results  Component Value Date   CREATININE 0.84 01/22/2024   Lab Results  Component Value Date   GFR 82.90 07/16/2023    ASSESSMENT / PLAN  1. Type 2 diabetes mellitus with hyperglycemia, with long-term current use of insulin  (HCC)   2. Acquired autoimmune hypothyroidism   3. Postmenopausal osteoporosis    Diabetes Mellitus type 2,  complicated by microalbuminuria. - Diabetic status / severity: Controlled.  Lab Results  Component Value Date   HGBA1C 6.6 (A) 02/12/2024    - Hemoglobin A1c goal : <6.5%  - Medications: See below.  Diabetes regimen:  Continue Tresiba  20 units daily in the morning. Continue metformin  1000 mg two times a day. Continue Farxiga  10 mg daily.    Novolog  sliding scale before meals and as needed.  Advised patient to take NovoLog  as needed for correcting hyperglycemia.  Advised to limit carbs with meals as well.  Advised to take NovoLog  2 to 3 units with lunch.  Mild Sliding Scale Blood Glucose        Insulin  60-150                     None 151-200                   1 unit 201-250                   2 units 251-300                   4 units 301-350                   6 units 351-400                   8 units      >400                        9 units and call provider  - Home glucose testing: CGM Dexcom G and check as needed.  - Discussed/ Gave Hypoglycemia treatment plan.  Advised to use glucose tablet for correcting hypoglycemia.  She has Glucagon  Emergency Kit at home.  # Consult : not required at this time.   # Annual urine for microalbuminuria/ creatinine ratio, + microalbuminuria currently, continue ACE/ARB /valsartan .  On Farxiga .   Last  Lab Results  Component Value Date   MICRALBCREAT 258 (H) 11/21/2023    # Foot check nightly.  # Annual dilated diabetic eye exams.   - Diet: Make healthy diabetic food choices.  - Life style / activity / exercise: Discussed.  2. Blood pressure  -  BP Readings from Last 1 Encounters:  02/12/24 112/68    - Control is in target.  - No change in current plans.   3. Lipid status / Hyperlipidemia - Last  Lab Results  Component Value Date   LDLCALC 89 08/08/2023   - Continue rosuvastatin  40 mg daily.  # Primary hypothyroidism -Continue levothyroxine  112 mcg daily.    # Osteoporosis -Patient had osteoporotic/fragility  fracture of L1 vertebra and right wrist in July 2024 and April 2025 respectively.  She has established osteoporosis.  Based on fragility fracture she also has a clinical osteoporosis. DEXA scan in January 2023 consistent with normal bone mineral density.  She has degenerative changes on the spine probably causing falsely elevation of bone mineral density.  DXA scan in May 2024 reviewed although T-score consistent with normal bone density, she has established osteoporosis based on fragility fractures.  Plan: -She received first dose of Reclast  in September 2025.  Will continue annually. - Continue current dose of calcium /vitamin D /multivitamin daily. - Discussed about fall precaution and weightbearing exercise as tolerated.  Diagnoses and all orders for this visit:  Type 2 diabetes mellitus with hyperglycemia, with long-term current use of insulin  (HCC) -     POCT glycosylated hemoglobin (Hb A1C)  Acquired autoimmune hypothyroidism  Postmenopausal osteoporosis    DISPOSITION Follow up in clinic in 3 -4 months suggested.    All questions answered and patient verbalized understanding of the plan.  Katelinn Justice, MD [facility] Endocrinology [facility] [address] [place], [address] [place], [place] [address] Phone # [phone]  At least part of this note was generated using voice recognition software. Inadvertent word errors may have occurred, which were not recognized during the proofreading process.

## 2024-02-17 ENCOUNTER — Other Ambulatory Visit: Payer: Self-pay | Admitting: Physician Assistant

## 2024-02-17 ENCOUNTER — Ambulatory Visit: Admitting: Internal Medicine

## 2024-02-17 NOTE — Telephone Encounter (Signed)
 Last Fill: 01/19/2024 (30 day supply)  Labs: 01/22/2024 Hemoglobin remains low but stable-11.4, rest of CBC WNL  CMP WNL  Next Visit: 03/18/2024  Last Visit: 10/21/2023  DX: Rheumatoid arthritis involving multiple sites with positive rheumatoid factor   Current Dose per office note 10/21/2023: Arava  20 mg 1 tablet by mouth daily   Okay to refill Arava  ?

## 2024-03-02 ENCOUNTER — Ambulatory Visit: Admitting: Podiatry

## 2024-03-02 DIAGNOSIS — Q828 Other specified congenital malformations of skin: Secondary | ICD-10-CM | POA: Diagnosis not present

## 2024-03-02 DIAGNOSIS — Z794 Long term (current) use of insulin: Secondary | ICD-10-CM | POA: Diagnosis not present

## 2024-03-02 DIAGNOSIS — E119 Type 2 diabetes mellitus without complications: Secondary | ICD-10-CM

## 2024-03-03 ENCOUNTER — Ambulatory Visit: Admitting: Podiatry

## 2024-03-03 ENCOUNTER — Other Ambulatory Visit: Payer: Self-pay

## 2024-03-04 NOTE — Progress Notes (Unsigned)
 Office Visit Note  Patient: Marissa Roberts             Date of Birth: [birth year]           MRN: [id number]             PCP: Rollene Almarie LABOR, MD Referring: Rollene Almarie LABOR, MD Visit Date: 03/18/2024 Occupation: Data Unavailable  Subjective:  Right thumb pain   History of Present Illness: SHARESA KEMP is a 78 y.o. female with history of seropositive rheumatoid arthritis and osteoarthritis.  She is taking arava  20 mg 1 tablet by mouth daily.  She continues to tolerate Arava  without any side effects and has not had any recent gaps in therapy.  Patient reports for the past 2 weeks she has had increased discomfort in the right thumb primarily affecting the right first MCP joint.  She denies any joint swelling at this time.  Patient states that the symptoms are exacerbated by trying to open water  bottles.  She has been using direct repairs at home for assistance.  She has been taking Tylenol  as needed and using diclofenac  gel topically for pain relief.  She will occasionally take tramadol  for pain relief.  She has some stiffness in the cervical spine as well as some soreness of the left shoulder but overall her symptoms have been well-controlled on the current treatment regimen. She denies any new medical conditions.  She denies any recent or recurrent infections. Patient received the first IV Reclast  infusion in September 2025.  She experienced increased neck stiffness after the infusion lasting for about 2 weeks but otherwise had no side effects.  She denies any recent falls or fractures.    Activities of Daily Living:  Patient reports morning stiffness for 0 minute.   Patient Denies nocturnal pain.  Difficulty dressing/grooming: Denies Difficulty climbing stairs: Reports Difficulty getting out of chair: Reports Difficulty using hands for taps, buttons, cutlery, and/or writing: Reports  Review of Systems  Constitutional:  Positive for fatigue.  HENT:  Negative for mouth  sores and mouth dryness.   Eyes:  Negative for dryness.  Respiratory:  Negative for shortness of breath.   Cardiovascular:  Negative for chest pain and palpitations.  Gastrointestinal:  Negative for blood in stool, constipation and diarrhea.  Endocrine: Negative for increased urination.  Genitourinary:  Negative for involuntary urination.  Musculoskeletal:  Positive for joint pain, gait problem, joint pain, joint swelling, myalgias, muscle weakness and myalgias. Negative for morning stiffness and muscle tenderness.  Skin:  Positive for hair loss. Negative for color change, rash and sensitivity to sunlight.  Allergic/Immunologic: Negative for susceptible to infections.  Neurological:  Negative for dizziness and headaches.  Hematological:  Negative for swollen glands.  Psychiatric/Behavioral:  Negative for depressed mood and sleep disturbance. The patient is not nervous/anxious.     PMFS History:  Patient Active Problem List   Diagnosis Date Noted   Proteinuria 11/28/2023   Chronic constipation 11/28/2023   GERD (gastroesophageal reflux disease) 11/28/2023   Allergic rhinitis 11/28/2023   Closed fracture of distal end of right radius 07/24/2023   Pain in right wrist 07/23/2023   Closed fracture of middle phalanx of lesser toe 07/14/2023   Pain of toe of left foot 07/10/2023   Fear of other medical care 06/30/2023   Pain of right hip joint 06/10/2023   Lumbar radiculopathy 09/09/2022   Lumbar compression fracture (HCC) 03/29/2022   Anxiety 02/25/2022   Generalized subcutaneous nodules 11/22/2021   Pain in knee  region after total knee replacement 03/22/2020   B12 deficiency 03/23/2019   Anemia 02/05/2019   Rheumatoid arthritis with rheumatoid factor of multiple sites without organ or systems involvement (HCC) 01/25/2019   High risk medication use 01/25/2019   Fatigue 09/18/2018   Encounter for general adult medical examination with abnormal findings 02/07/2015   Hyperlipidemia  associated with type 2 diabetes mellitus (HCC) 07/28/2009   Age related osteoporosis 03/14/2009   Diabetes mellitus type 2 with complications (HCC) 11/14/2006   Hypothyroidism 09/24/2006   Essential hypertension 09/24/2006    Past Medical History:  Diagnosis Date   Anemia    Arthritis    Bronchitis    hx of   Diabetes mellitus    type 2   Family history of adverse reaction to anesthesia    per patient, daughter threw up after a knee surgery   GERD (gastroesophageal reflux disease)    Hypercholesteremia    Hypertension    Hypothyroidism    Osteoporosis    Pneumonia    hx of   PONV (postoperative nausea and vomiting)    Thyroid  disease     Family History  Problem Relation Age of Onset   Diabetes Mother    Heart disease Mother    Hyperlipidemia Mother    Diabetes Father    Diabetes Sister    Hypertension Sister    Heart disease Sister    Dementia Sister    Heart disease Brother    Healthy Daughter    Healthy Daughter    Healthy Daughter    Healthy Son    Past Surgical History:  Procedure Laterality Date   ABDOMINAL HYSTERECTOMY     partial   BREAST SURGERY     lumpectomy left breast   carpel tunnel     COLONOSCOPY  07/2020   COLONOSCOPY W/ POLYPECTOMY     FOOT SURGERY     knee arhtroscopy     KNEE ARTHROPLASTY Left 05/2020   KYPHOPLASTY N/A 04/08/2022   Procedure: KYPHOPLASTY L1;  Surgeon: Burnetta Aures, MD;  Location: [facility] OR;  Service: Orthopedics;  Laterality: N/A;  Local with [facility]   ORIF PATELLA Left 05/23/2020   Procedure: LEFT KNEE POLE PATELLA EXCISION;  Surgeon: Addie Cordella Hamilton, MD;  Location: [facility] OR;  Service: Orthopedics;  Laterality: Left;   SHOULDER ARTHROSCOPY WITH SUBACROMIAL DECOMPRESSION, ROTATOR CUFF REPAIR AND BICEP TENDON REPAIR Right 05/26/2012   Procedure: Right Shoulder Diagnostic Operative Arthroscopy, Subacromial Decompression, Biceps Tenodesis, Mini Open Rotator Cuff Repair;  Surgeon: Cordella Hamilton Addie, MD;  Location: [facility] OR;   Service: Orthopedics;  Laterality: Right;  Right Shoulder Diagnostic Operative Arthroscopy, Subacromial Decompression, Biceps Tenodesis, Mini Open Rotator Cuff Repair   TONSILLECTOMY     TOTAL KNEE ARTHROPLASTY Left 07/01/2019   Procedure: LEFT TOTAL KNEE ARTHROPLASTY;  Surgeon: Addie Cordella Hamilton, MD;  Location: [facility] OR;  Service: Orthopedics;  Laterality: Left;   UPPER GI ENDOSCOPY  07/2020   Social History   Tobacco Use   Smoking status: Former    Current packs/day: 0.00    Types: Cigarettes    Quit date: 04/01/1990    Years since quitting: 33.9    Passive exposure: Past   Smokeless tobacco: Never  Vaping Use   Vaping status: Never Used  Substance Use Topics   Alcohol  use: Not Currently    Comment: 06/22/2019: per patient about 20-25 years ago   Drug use: No   Social History   Social History Narrative   Lives with husband/2025  Immunization History  Administered Date(s) Administered   Fluad Quad(high Dose 65+) 01/02/2019, 12/31/2019, 12/24/2022   INFLUENZA, HIGH DOSE SEASONAL PF 01/07/2014, 01/27/2015, 01/02/2016, 01/13/2017, 01/16/2018   Influenza Split 01/03/2011   Influenza Whole 04/01/2005, 12/18/2009   Influenza-Unspecified 11/30/2012, 12/25/2012, 01/03/2021, 01/08/2022   PFIZER Comirnaty(Gray Top)Covid-19 Tri-Sucrose Vaccine 12/24/2022   PFIZER(Purple Top)SARS-COV-2 Vaccination 05/28/2019, 06/23/2019, 01/15/2020, 07/03/2020, 01/03/2021   PNEUMOCOCCAL CONJUGATE-20 05/10/2021   Pfizer(Comirnaty)Fall Seasonal Vaccine 12 years and older 01/08/2022   Pneumococcal Conjugate-13 09/12/2016, 01/02/2019   Pneumococcal Polysaccharide-23 07/15/2014, 12/27/2019   Respiratory Syncytial Virus Vaccine,Recomb Aduvanted(Arexvy) 01/08/2022   Td 04/01/2001, 10/07/2013   Zoster Recombinant(Shingrix) 01/09/2021, 03/12/2021     Objective: Vital Signs: BP 132/67   Pulse 82   Temp (!) 97.3 F (36.3 C)   Resp 16   Ht 5' 5 (1.651 m)   Wt 206 lb (93.4 kg)   BMI 34.28 kg/m     Physical Exam Vitals and nursing note reviewed.  Constitutional:      Appearance: She is well-developed.  HENT:     Head: Normocephalic and atraumatic.  Eyes:     Conjunctiva/sclera: Conjunctivae normal.  Cardiovascular:     Rate and Rhythm: Normal rate and regular rhythm.     Heart sounds: Normal heart sounds.  Pulmonary:     Effort: Pulmonary effort is normal.     Breath sounds: Normal breath sounds.  Abdominal:     General: Bowel sounds are normal.     Palpations: Abdomen is soft.  Musculoskeletal:     Cervical back: Normal range of motion.  Lymphadenopathy:     Cervical: No cervical adenopathy.  Skin:    General: Skin is warm and dry.     Capillary Refill: Capillary refill takes less than 2 seconds.  Neurological:     Mental Status: She is alert and oriented to person, place, and time.  Psychiatric:        Behavior: Behavior normal.      Musculoskeletal Exam: C-spine has limited range of motion with lateral rotation.  Good flexion and extension of the cervical spine.  Thoracic kyphosis noted.  Limited mobility of lumbar spine.  Some discomfort and stiffness with full abduction of both shoulders.  Tenderness of the left shoulder.  No tenderness or swelling along the elbow joint line.  Wrist joints with good range of motion with no tenderness or synovitis.  CMC, PIP, DIP thickening consistent with osteoarthritis of both hands.  Tenderness of the right first MCP joint.  Complete fist formation bilaterally.  Left knee replacement has good range of motion.  Right knee joint has good range of motion no warmth or effusion.  No tenderness or swelling along the ankle joint line.  CDAI Exam: CDAI Score: -- Patient Global: --; Provider Global: -- Swollen: --; Tender: -- Joint Exam 03/18/2024   No joint exam has been documented for this visit   There is currently no information documented on the homunculus. Go to the Rheumatology activity and complete the homunculus joint  exam.  Investigation: No additional findings.  Imaging: No results found.  Recent Labs: Lab Results  Component Value Date   WBC 9.1 01/22/2024   HGB 11.4 (L) 01/22/2024   PLT 277 01/22/2024   NA 141 01/22/2024   K 4.7 01/22/2024   CL 106 01/22/2024   CO2 25 01/22/2024   GLUCOSE 94 01/22/2024   BUN 14 01/22/2024   CREATININE 0.84 01/22/2024   BILITOT 0.3 01/22/2024   ALKPHOS 72 07/16/2023   AST 17  01/22/2024   ALT 20 01/22/2024   PROT 7.0 01/22/2024   ALBUMIN 4.3 07/16/2023   CALCIUM  9.6 01/22/2024   GFRAA 101 09/18/2020   QFTBGOLDPLUS NEGATIVE 01/09/2021    Speciality Comments: PLQ Eye Exam: 12/22/2019 WNL @ [facility] follow up in 6 months  Methotrexate -hair loss  Procedures:  No procedures performed Allergies: Contrast media [iodinated contrast media], Adhesive [tape], Erythromycin, and Penicillins         Assessment / Plan:     Visit Diagnoses: Rheumatoid arthritis involving multiple sites with positive rheumatoid factor (HCC): She has no synovitis on examination today.  She presents today with increased pain affecting the right first MCP joint x 2 weeks.  No injury prior to the onset of symptoms.  She has not noticed any joint swelling.  She is no synovitis on examination today but does have tenderness overlying the right first MCP joint.  She has tried applying Voltaren  gel topically which has been helpful.  She is also taking Tylenol  as needed for symptomatic relief.  She is not experiencing any locking at this time.  Discussed the use of arthritis compression gloves.  Discussed the importance of joint protection and muscle strengthening.  She remains on Arava  20 mg 1 tablet by mouth daily.  She is tolerating Arava  without any side effects.  Overall her symptoms remain rheumatoid arthritis remain well-controlled on the current treatment regimen.  No medication changes will be made at this time.  She was advised to notify us  if she develops any signs or symptoms  of a flare.  She will follow-up in the office in 5 months or sooner if needed.  High risk medication use - Arava  20 mg 1 tablet by mouth daily. CBC and CMP updated on 01/22/24.  Her next lab work will be due in January and every 3 months. No recent or recurrent infections.  Discussed the importance of holding arava  if she develops signs or symptoms of an infection and to resume once the infection has completely cleared.   Primary osteoarthritis of both hands: PIP and DIP thickening consistent with osteoarthritis of both hands.  No synovitis noted she has tenderness of the right first MCP joint today.  Her symptoms start about 2 weeks ago.  She has had difficulty opening water  bottles and jars but has been using a gripper for assistance.  She has not noticed any joint swelling.  Discussed the use of arthritis compression gloves, a brace, and Voltaren  gel which she can apply topically for pain relief.  She is vies notify us  if the symptoms persist or worsen.  Trochanteric bursitis, left hip: Not currently symptomatic.  S/P total knee replacement, left: Chronic pain.  No warmth or effusion noted.  Primary osteoarthritis of both feet: She has good range of motion of both ankle joints with no tenderness or joint swelling.  History of osteopenia: DEXA: 04/23/2021 T-score: -0.6, BMD: 1.009  AP Spine which was within normal limits--ordered by gynecologist.  DEXA updated on 08/15/23: AP spine T-score -0.1.  Left femoral neck T-score -0.9, right femoral neck T-score of -0.4.   She is taking a calcium  and vitamin D  supplement daily. History of L1 compression fracture and a recent right wrist fracture. She was started on IV reclast  in September 2025--started by endocrinologist.  No recent fall or fracture.   History of vertebral fracture:  L1 compression fracture after a fall in January 2024.  She underwent a kyphoplasty on 04/08/2022.   Patient was started on IV  Reclast  in September 2025.  Vitamin D   deficiency: She is taking a calcium  and vitamin D  supplement.   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  History of hypothyroidism  Hyperlipidemia associated with type 2 diabetes mellitus (HCC)  Demyelinating neuropathy  Diabetes mellitus type 2 with complications (HCC)  Orders: No orders of the defined types were placed in this encounter.  No orders of the defined types were placed in this encounter.   Follow-Up Instructions: Return in about 5 months (around 08/16/2024) for Rheumatoid arthritis, Osteoarthritis.   Waddell CHRISTELLA Craze, PA-C  Note - This record has been created using Dragon software.  Chart creation errors have been sought, but may not always  have been located. Such creation errors do not reflect on  the standard of medical care.

## 2024-03-07 ENCOUNTER — Encounter: Payer: Self-pay | Admitting: Podiatry

## 2024-03-07 NOTE — Progress Notes (Signed)
 Subjective:  Patient ID: Marissa Roberts, female    DOB: [birth year],  MRN: [id number]  Marissa Roberts presents to clinic today for preventative diabetic foot care and painful porokeratotic lesion(s) right foot and painful mycotic toenails that limit ambulation. Painful toenails interfere with ambulation. Aggravating factors include wearing enclosed shoe gear. Pain is relieved with periodic professional debridement. Painful porokeratotic lesions are aggravated when weightbearing with and without shoegear. Pain is relieved with periodic professional debridement.  Chief Complaint  Patient presents with   Nail Problem    Thick painful toenails, 9 week follow up    New problem(s): None.   PCP is Rollene Almarie LABOR, MD.LOV 11/28/23.  Allergies  Allergen Reactions   Contrast Media [Iodinated Contrast Media]     Throat swells from ct contra   Adhesive [Tape]     No paper tape, causes blisters   Erythromycin Rash   Penicillins Rash    Did it involve swelling of the face/tongue/throat, SOB, or low BP? No Did it involve sudden or severe rash/hives, skin peeling, or any reaction on the inside of your mouth or nose? No Did you need to seek medical attention at a hospital or doctor's office? No When did it last happen?    Many years If all above answers are "NO", may proceed with cephalosporin use.     Review of Systems: Negative except as noted in the HPI.  Objective: No changes noted in today's physical examination. There were no vitals filed for this visit. Marissa Roberts is a pleasant 78 y.o. female in NAD. AAO x 3.  Vascular Examination: Capillary refill time immediate b/l. Palpable pedal pulses. Pedal hair present b/l. No pain with calf compression b/l. Skin temperature gradient WNL b/l. No cyanosis or clubbing b/l. No ischemia or gangrene noted b/l. No edema noted b/l LE.  Neurological Examination: Sensation grossly intact b/l with 10 gram monofilament. Vibratory  sensation intact b/l. Pt has subjective symptoms of neuropathy.  Dermatological Examination: Pedal skin warm and supple b/l.  No open wounds b/l. No interdigital macerations. Toenails 1-5 b/l elongated, thickened, discolored with subungual debris. +Tenderness with dorsal palpation of nailplates. Hyperkeratotic lesion dorsal PIPJ b/l 5th toes. Porokeratotic lesion(s) noted distal tip of right 4th toe.  Musculoskeletal Examination: Muscle strength 5/5 to all lower extremity muscle groups bilaterally. Hammertoe(s) 2-5 b/l.Marissa Roberts No pain, crepitus or joint limitation noted with ROM b/l LE.  Patient ambulates independently without assistive aids.  Radiographs: None  Assessment/Plan: 1. Porokeratosis   2. Type 2 diabetes mellitus without complication, with long-term current use of insulin  [facility])   Patient was evaluated and treated. All patient's and/or POA's questions/concerns addressed on today's visit. As a courtesy, mycotic toenails 1-5 b/l debrided in length and girth without incident. Porokeratotic lesion(s) distal tip of right 4th toe pared with sharp debridement without incident. Continue daily foot inspections and monitor blood glucose per PCP/Endocrinologist's recommendations. Continue soft, supportive shoe gear daily. Report any pedal injuries to medical professional. Call office if there are any questions/concerns.  Return in about 3 months (around 05/31/2024).  Delon LITTIE Merlin, DPM      [facility] LOCATION: [address], [place] [address]  Office [phone]   [facility] LOCATION: [address] [place], [place] [address] Office [phone]

## 2024-03-18 ENCOUNTER — Encounter: Payer: Self-pay | Admitting: Physician Assistant

## 2024-03-18 ENCOUNTER — Ambulatory Visit: Attending: Physician Assistant | Admitting: Physician Assistant

## 2024-03-18 VITALS — BP 132/67 | HR 82 | Temp 97.3°F | Resp 16 | Ht 65.0 in | Wt 206.0 lb

## 2024-03-18 DIAGNOSIS — E785 Hyperlipidemia, unspecified: Secondary | ICD-10-CM

## 2024-03-18 DIAGNOSIS — Z79899 Other long term (current) drug therapy: Secondary | ICD-10-CM | POA: Diagnosis not present

## 2024-03-18 DIAGNOSIS — M7062 Trochanteric bursitis, left hip: Secondary | ICD-10-CM | POA: Diagnosis not present

## 2024-03-18 DIAGNOSIS — I10 Essential (primary) hypertension: Secondary | ICD-10-CM | POA: Diagnosis not present

## 2024-03-18 DIAGNOSIS — E1144 Type 2 diabetes mellitus with diabetic amyotrophy: Secondary | ICD-10-CM

## 2024-03-18 DIAGNOSIS — Z8781 Personal history of (healed) traumatic fracture: Secondary | ICD-10-CM | POA: Diagnosis not present

## 2024-03-18 DIAGNOSIS — M0579 Rheumatoid arthritis with rheumatoid factor of multiple sites without organ or systems involvement: Secondary | ICD-10-CM | POA: Diagnosis not present

## 2024-03-18 DIAGNOSIS — Z96652 Presence of left artificial knee joint: Secondary | ICD-10-CM | POA: Diagnosis not present

## 2024-03-18 DIAGNOSIS — M19041 Primary osteoarthritis, right hand: Secondary | ICD-10-CM

## 2024-03-18 DIAGNOSIS — M19072 Primary osteoarthritis, left ankle and foot: Secondary | ICD-10-CM

## 2024-03-18 DIAGNOSIS — E559 Vitamin D deficiency, unspecified: Secondary | ICD-10-CM

## 2024-03-18 DIAGNOSIS — G629 Polyneuropathy, unspecified: Secondary | ICD-10-CM

## 2024-03-18 DIAGNOSIS — Z8639 Personal history of other endocrine, nutritional and metabolic disease: Secondary | ICD-10-CM | POA: Diagnosis not present

## 2024-03-18 DIAGNOSIS — M19071 Primary osteoarthritis, right ankle and foot: Secondary | ICD-10-CM | POA: Diagnosis not present

## 2024-03-18 DIAGNOSIS — E1169 Type 2 diabetes mellitus with other specified complication: Secondary | ICD-10-CM

## 2024-03-18 DIAGNOSIS — Z8739 Personal history of other diseases of the musculoskeletal system and connective tissue: Secondary | ICD-10-CM

## 2024-03-18 DIAGNOSIS — M19042 Primary osteoarthritis, left hand: Secondary | ICD-10-CM

## 2024-03-18 DIAGNOSIS — E118 Type 2 diabetes mellitus with unspecified complications: Secondary | ICD-10-CM

## 2024-03-18 NOTE — Patient Instructions (Signed)
 Standing Labs We placed an order today for your standing lab work.   Please have your standing labs drawn in January and every 3 months   Please have your labs drawn 2 weeks prior to your appointment so that the provider can discuss your lab results at your appointment, if possible.  Please note that you may see your imaging and lab results in MyChart before we have reviewed them. We will contact you once all results are reviewed. Please allow our office up to 72 hours to thoroughly review all of the results before contacting the office for clarification of your results.  WALK-IN LAB HOURS  Monday through Thursday from 8:00 am - 4:30 pm and Friday from 8:00 am-12:00 pm.  Patients with office visits requiring labs will be seen before walk-in labs.  You may encounter longer than normal wait times. Please allow additional time. Wait times may be shorter on  Monday and Thursday afternoons.  We do not book appointments for walk-in labs. We appreciate your patience and understanding with our staff.   Labs are drawn by [facility]. Please bring your co-pay at the time of your lab draw.  You may receive a bill from [facility] for your lab work.  Please note if you are on Hydroxychloroquine  and and an order has been placed for a Hydroxychloroquine  level,  you will need to have it drawn 4 hours or more after your last dose.  If you wish to have your labs drawn at another location, please call the office 24 hours in advance so we can fax the orders.  The office is located at [address], [place], [place] [address]   If you have any questions regarding directions or hours of operation,  please call [phone].   As a reminder, please drink plenty of water  prior to coming for your lab work. Thanks!

## 2024-04-20 ENCOUNTER — Encounter: Payer: Self-pay | Admitting: *Deleted

## 2024-04-20 NOTE — Progress Notes (Signed)
 Marissa Roberts                                          MRN: [id number]   04/20/2024   The [facility] Quality Team Specialist reviewed this patient medical record for the purposes of chart review for care gap closure. The following were reviewed: abstraction for care gap closure-controlling blood pressure.    [facility] Quality Team

## 2024-05-31 ENCOUNTER — Ambulatory Visit: Admitting: Internal Medicine

## 2024-06-15 ENCOUNTER — Ambulatory Visit: Admitting: Endocrinology

## 2024-06-22 ENCOUNTER — Ambulatory Visit: Admitting: Podiatry

## 2024-08-19 ENCOUNTER — Ambulatory Visit: Admitting: Physician Assistant

## 2024-09-21 ENCOUNTER — Ambulatory Visit: Admitting: Podiatry
